# Patient Record
Sex: Male | Born: 1940
Health system: Southern US, Community
[De-identification: ages and names within clinical notes are randomized; demographics above are authoritative.]

## PROBLEM LIST (undated history)

## (undated) DIAGNOSIS — IMO0001 Reserved for inherently not codable concepts without codable children: Secondary | ICD-10-CM

## (undated) DIAGNOSIS — I10 Essential (primary) hypertension: Secondary | ICD-10-CM

## (undated) DIAGNOSIS — E7800 Pure hypercholesterolemia, unspecified: Secondary | ICD-10-CM

## (undated) DIAGNOSIS — R55 Syncope and collapse: Secondary | ICD-10-CM

## (undated) DIAGNOSIS — E039 Hypothyroidism, unspecified: Secondary | ICD-10-CM

## (undated) DIAGNOSIS — G4730 Sleep apnea, unspecified: Secondary | ICD-10-CM

## (undated) DIAGNOSIS — E119 Type 2 diabetes mellitus without complications: Secondary | ICD-10-CM

## (undated) DIAGNOSIS — I2510 Atherosclerotic heart disease of native coronary artery without angina pectoris: Secondary | ICD-10-CM

## (undated) HISTORY — PX: APPENDECTOMY: SHX54

## (undated) HISTORY — PX: HERNIA REPAIR: SHX51

## (undated) HISTORY — PX: THYROID SURGERY: SHX805

---

## 2001-02-08 ENCOUNTER — Ambulatory Visit (HOSPITAL_COMMUNITY): Admission: RE | Admit: 2001-02-08 | Discharge: 2001-02-08 | Payer: Self-pay | Admitting: Cardiology

## 2001-02-22 ENCOUNTER — Ambulatory Visit (HOSPITAL_COMMUNITY): Admission: RE | Admit: 2001-02-22 | Discharge: 2001-02-22 | Payer: Self-pay | Admitting: Cardiology

## 2003-03-24 ENCOUNTER — Ambulatory Visit (HOSPITAL_COMMUNITY): Admission: RE | Admit: 2003-03-24 | Discharge: 2003-03-24 | Payer: Self-pay | Admitting: Pulmonary Disease

## 2005-08-21 ENCOUNTER — Ambulatory Visit: Payer: Self-pay | Admitting: Internal Medicine

## 2005-08-21 LAB — CONVERTED CEMR LAB
BUN: 24 mg/dL
CO2: 26 meq/L
Chloride: 103 meq/L
Cholesterol: 256 mg/dL
Creatinine, Ser: 1.38 mg/dL
Glucose, Bld: 145 mg/dL
HDL: 47 mg/dL
LDL Cholesterol: 191 mg/dL
Microalb Creat Ratio: 4.1 mg/g
Microalb, Ur: 0.79 mg/dL
Potassium: 4.4 meq/L
Sodium: 140 meq/L
TSH: 1.718 microintl units/mL
Total CHOL/HDL Ratio: 5.4
Triglycerides: 92 mg/dL
VLDL: 18 mg/dL

## 2005-10-05 ENCOUNTER — Ambulatory Visit: Payer: Self-pay | Admitting: Internal Medicine

## 2005-11-16 ENCOUNTER — Ambulatory Visit: Payer: Self-pay | Admitting: Internal Medicine

## 2005-11-16 LAB — CONVERTED CEMR LAB
ALT: 13 units/L
AST: 22 units/L
Albumin: 4.4 g/dL
Alkaline Phosphatase: 51 units/L
Bilirubin, Direct: 0.1 mg/dL
Indirect Bilirubin: 0.5 mg/dL
Total Bilirubin: 0.6 mg/dL
Total Protein: 7.1 g/dL

## 2005-12-28 ENCOUNTER — Ambulatory Visit: Payer: Self-pay | Admitting: Internal Medicine

## 2005-12-28 LAB — CONVERTED CEMR LAB: Hgb A1c MFr Bld: 7.4 %

## 2006-01-13 ENCOUNTER — Encounter: Payer: Self-pay | Admitting: Internal Medicine

## 2006-01-13 DIAGNOSIS — G609 Hereditary and idiopathic neuropathy, unspecified: Secondary | ICD-10-CM

## 2006-01-13 DIAGNOSIS — E785 Hyperlipidemia, unspecified: Secondary | ICD-10-CM

## 2006-01-13 DIAGNOSIS — I739 Peripheral vascular disease, unspecified: Secondary | ICD-10-CM | POA: Insufficient documentation

## 2006-01-13 DIAGNOSIS — I10 Essential (primary) hypertension: Secondary | ICD-10-CM

## 2006-01-13 HISTORY — DX: Hereditary and idiopathic neuropathy, unspecified: G60.9

## 2006-03-29 ENCOUNTER — Ambulatory Visit: Payer: Self-pay | Admitting: Internal Medicine

## 2006-03-29 DIAGNOSIS — E039 Hypothyroidism, unspecified: Secondary | ICD-10-CM

## 2006-03-29 LAB — CONVERTED CEMR LAB: Hgb A1c MFr Bld: 7.2 %

## 2006-04-02 ENCOUNTER — Encounter (INDEPENDENT_AMBULATORY_CARE_PROVIDER_SITE_OTHER): Payer: Self-pay | Admitting: Internal Medicine

## 2006-04-02 LAB — CONVERTED CEMR LAB
AST: 19 units/L (ref 0–37)
Albumin: 4.3 g/dL (ref 3.5–5.2)
Alkaline Phosphatase: 52 units/L (ref 39–117)
Glucose, Bld: 115 mg/dL — ABNORMAL HIGH (ref 70–99)
LDL Cholesterol: 98 mg/dL (ref 0–99)
Potassium: 4.5 meq/L (ref 3.5–5.3)
Sodium: 140 meq/L (ref 135–145)
TSH: 0.267 microintl units/mL — ABNORMAL LOW (ref 0.350–5.50)
Total Protein: 7 g/dL (ref 6.0–8.3)

## 2006-05-10 ENCOUNTER — Ambulatory Visit: Payer: Self-pay | Admitting: Internal Medicine

## 2006-05-11 LAB — CONVERTED CEMR LAB: Free T4: 1.27 ng/dL (ref 0.89–1.80)

## 2006-06-21 ENCOUNTER — Ambulatory Visit: Payer: Self-pay | Admitting: Internal Medicine

## 2006-06-21 LAB — CONVERTED CEMR LAB: Hgb A1c MFr Bld: 6.9 %

## 2006-08-20 ENCOUNTER — Telehealth (INDEPENDENT_AMBULATORY_CARE_PROVIDER_SITE_OTHER): Payer: Self-pay | Admitting: *Deleted

## 2006-08-21 ENCOUNTER — Telehealth (INDEPENDENT_AMBULATORY_CARE_PROVIDER_SITE_OTHER): Payer: Self-pay | Admitting: Internal Medicine

## 2006-09-17 ENCOUNTER — Ambulatory Visit: Payer: Self-pay | Admitting: Internal Medicine

## 2006-09-17 ENCOUNTER — Telehealth (INDEPENDENT_AMBULATORY_CARE_PROVIDER_SITE_OTHER): Payer: Self-pay | Admitting: *Deleted

## 2006-09-18 ENCOUNTER — Telehealth (INDEPENDENT_AMBULATORY_CARE_PROVIDER_SITE_OTHER): Payer: Self-pay | Admitting: *Deleted

## 2006-09-18 ENCOUNTER — Encounter (INDEPENDENT_AMBULATORY_CARE_PROVIDER_SITE_OTHER): Payer: Self-pay | Admitting: Internal Medicine

## 2006-09-18 LAB — CONVERTED CEMR LAB
ALT: 12 U/L
AST: 15 U/L
Albumin: 4.3 g/dL
Alkaline Phosphatase: 49 U/L
BUN: 27 mg/dL — ABNORMAL HIGH
CO2: 24 meq/L
Calcium: 9.7 mg/dL
Chloride: 106 meq/L
Cholesterol: 154 mg/dL
Creatinine, Ser: 1.21 mg/dL
Glucose, Bld: 82 mg/dL
HDL: 52 mg/dL
LDL Cholesterol: 88 mg/dL
Potassium: 4.3 meq/L
Sodium: 142 meq/L
TSH: 0.283 u[IU]/mL — ABNORMAL LOW
Total Bilirubin: 0.4 mg/dL
Total CHOL/HDL Ratio: 3
Total Protein: 7 g/dL
Triglycerides: 69 mg/dL
VLDL: 14 mg/dL

## 2006-11-23 ENCOUNTER — Telehealth (INDEPENDENT_AMBULATORY_CARE_PROVIDER_SITE_OTHER): Payer: Self-pay | Admitting: *Deleted

## 2006-12-17 ENCOUNTER — Ambulatory Visit: Payer: Self-pay | Admitting: Internal Medicine

## 2006-12-17 DIAGNOSIS — T1490XA Injury, unspecified, initial encounter: Secondary | ICD-10-CM

## 2006-12-17 LAB — CONVERTED CEMR LAB
Microalb Creat Ratio: 4 mg/g (ref 0.0–30.0)
Microalb, Ur: 0.67 mg/dL (ref 0.00–1.89)

## 2006-12-25 ENCOUNTER — Telehealth (INDEPENDENT_AMBULATORY_CARE_PROVIDER_SITE_OTHER): Payer: Self-pay | Admitting: *Deleted

## 2007-01-15 ENCOUNTER — Encounter (INDEPENDENT_AMBULATORY_CARE_PROVIDER_SITE_OTHER): Payer: Self-pay | Admitting: Internal Medicine

## 2007-01-18 ENCOUNTER — Telehealth (INDEPENDENT_AMBULATORY_CARE_PROVIDER_SITE_OTHER): Payer: Self-pay | Admitting: Internal Medicine

## 2007-03-18 ENCOUNTER — Ambulatory Visit: Payer: Self-pay | Admitting: Internal Medicine

## 2007-03-18 LAB — CONVERTED CEMR LAB
Blood Glucose, Fingerstick: 135
Hgb A1c MFr Bld: 7.8 %

## 2007-04-23 ENCOUNTER — Encounter (INDEPENDENT_AMBULATORY_CARE_PROVIDER_SITE_OTHER): Payer: Self-pay | Admitting: Internal Medicine

## 2007-04-24 ENCOUNTER — Telehealth (INDEPENDENT_AMBULATORY_CARE_PROVIDER_SITE_OTHER): Payer: Self-pay | Admitting: *Deleted

## 2007-04-24 LAB — CONVERTED CEMR LAB
ALT: 12 units/L (ref 0–53)
AST: 16 units/L (ref 0–37)
Alkaline Phosphatase: 52 units/L (ref 39–117)
Calcium: 9.9 mg/dL (ref 8.4–10.5)
Chloride: 104 meq/L (ref 96–112)
Creatinine, Ser: 1.16 mg/dL (ref 0.40–1.50)
LDL Cholesterol: 91 mg/dL (ref 0–99)
Total CHOL/HDL Ratio: 3
VLDL: 13 mg/dL (ref 0–40)

## 2007-06-17 ENCOUNTER — Ambulatory Visit: Payer: Self-pay | Admitting: Internal Medicine

## 2007-06-17 LAB — CONVERTED CEMR LAB
Blood Glucose, Fingerstick: 96
Hgb A1c MFr Bld: 7.2 %

## 2007-09-18 ENCOUNTER — Ambulatory Visit: Payer: Self-pay | Admitting: Internal Medicine

## 2007-09-25 ENCOUNTER — Encounter (INDEPENDENT_AMBULATORY_CARE_PROVIDER_SITE_OTHER): Payer: Self-pay | Admitting: Internal Medicine

## 2007-12-18 ENCOUNTER — Ambulatory Visit: Payer: Self-pay | Admitting: Internal Medicine

## 2007-12-19 LAB — CONVERTED CEMR LAB
AST: 17 units/L (ref 0–37)
Albumin: 4.6 g/dL (ref 3.5–5.2)
Alkaline Phosphatase: 54 units/L (ref 39–117)
Basophils Relative: 0 % (ref 0–1)
Eosinophils Absolute: 0.1 10*3/uL (ref 0.0–0.7)
Eosinophils Relative: 1 % (ref 0–5)
HDL: 52 mg/dL (ref >39)
LDL Cholesterol: 87 mg/dL (ref 0–99)
MCHC: 31.5 g/dL (ref 30.0–36.0)
MCV: 90.7 fL (ref 78.0–100.0)
Neutrophils Relative %: 67 % (ref 43–77)
Platelets: 294 10*3/uL (ref 150–400)
Potassium: 4.4 meq/L (ref 3.5–5.3)
RDW: 14.9 % (ref 11.5–15.5)
Sodium: 142 meq/L (ref 135–145)
TSH: 1.028 microintl units/mL (ref 0.350–4.50)
Total Bilirubin: 0.5 mg/dL (ref 0.3–1.2)
Total Protein: 7.3 g/dL (ref 6.0–8.3)
VLDL: 11 mg/dL (ref 0–40)

## 2008-02-24 ENCOUNTER — Encounter (INDEPENDENT_AMBULATORY_CARE_PROVIDER_SITE_OTHER): Payer: Self-pay | Admitting: Internal Medicine

## 2008-03-17 ENCOUNTER — Ambulatory Visit: Payer: Self-pay | Admitting: Internal Medicine

## 2008-03-17 LAB — CONVERTED CEMR LAB: Hgb A1c MFr Bld: 7.8 %

## 2008-03-31 ENCOUNTER — Telehealth (INDEPENDENT_AMBULATORY_CARE_PROVIDER_SITE_OTHER): Payer: Self-pay | Admitting: Internal Medicine

## 2008-04-08 ENCOUNTER — Encounter (INDEPENDENT_AMBULATORY_CARE_PROVIDER_SITE_OTHER): Payer: Self-pay | Admitting: Internal Medicine

## 2008-04-09 ENCOUNTER — Encounter (INDEPENDENT_AMBULATORY_CARE_PROVIDER_SITE_OTHER): Payer: Self-pay | Admitting: Internal Medicine

## 2008-06-16 ENCOUNTER — Ambulatory Visit: Payer: Self-pay | Admitting: Internal Medicine

## 2008-06-16 LAB — CONVERTED CEMR LAB: Blood Glucose, Fingerstick: 133

## 2008-06-17 ENCOUNTER — Telehealth (INDEPENDENT_AMBULATORY_CARE_PROVIDER_SITE_OTHER): Payer: Self-pay | Admitting: *Deleted

## 2008-06-17 ENCOUNTER — Encounter (INDEPENDENT_AMBULATORY_CARE_PROVIDER_SITE_OTHER): Payer: Self-pay | Admitting: Internal Medicine

## 2008-06-17 LAB — CONVERTED CEMR LAB
Ferritin: 69 ng/mL (ref 22–322)
Iron: 107 ug/dL (ref 42–165)
Saturation Ratios: 29 % (ref 20–55)
Vitamin B-12: 353 pg/mL (ref 211–911)

## 2008-06-18 LAB — CONVERTED CEMR LAB
Alkaline Phosphatase: 55 units/L (ref 39–117)
BUN: 21 mg/dL (ref 6–23)
Basophils Absolute: 0 10*3/uL (ref 0.0–0.1)
Basophils Relative: 1 % (ref 0–1)
CO2: 22 meq/L (ref 19–32)
Cholesterol: 166 mg/dL (ref 0–200)
Creatinine, Ser: 1.06 mg/dL (ref 0.40–1.50)
Eosinophils Absolute: 0.1 10*3/uL (ref 0.0–0.7)
Eosinophils Relative: 1 % (ref 0–5)
Glucose, Bld: 128 mg/dL — ABNORMAL HIGH (ref 70–99)
HDL: 52 mg/dL (ref >39)
Hemoglobin: 12.2 g/dL — ABNORMAL LOW (ref 13.0–17.0)
LDL Cholesterol: 102 mg/dL — ABNORMAL HIGH (ref 0–99)
MCHC: 31.6 g/dL (ref 30.0–36.0)
MCV: 90.6 fL (ref 78.0–100.0)
Monocytes Absolute: 0.5 10*3/uL (ref 0.1–1.0)
Monocytes Relative: 9 % (ref 3–12)
Neutro Abs: 3.6 10*3/uL (ref 1.7–7.7)
RBC: 4.26 M/uL (ref 4.22–5.81)
RDW: 15.7 % — ABNORMAL HIGH (ref 11.5–15.5)
Sodium: 142 meq/L (ref 135–145)
Total Bilirubin: 0.5 mg/dL (ref 0.3–1.2)
Total CHOL/HDL Ratio: 3.2
Triglycerides: 60 mg/dL (ref <150)
VLDL: 12 mg/dL (ref 0–40)

## 2008-06-30 ENCOUNTER — Ambulatory Visit: Payer: Self-pay | Admitting: Internal Medicine

## 2008-09-16 ENCOUNTER — Ambulatory Visit: Payer: Self-pay | Admitting: Internal Medicine

## 2008-09-16 DIAGNOSIS — E785 Hyperlipidemia, unspecified: Secondary | ICD-10-CM

## 2008-09-16 DIAGNOSIS — E1169 Type 2 diabetes mellitus with other specified complication: Secondary | ICD-10-CM

## 2008-09-16 LAB — CONVERTED CEMR LAB: Hgb A1c MFr Bld: 7.6 %

## 2008-09-30 ENCOUNTER — Encounter (INDEPENDENT_AMBULATORY_CARE_PROVIDER_SITE_OTHER): Payer: Self-pay | Admitting: Internal Medicine

## 2008-12-16 ENCOUNTER — Encounter: Payer: Self-pay | Admitting: Gastroenterology

## 2008-12-22 ENCOUNTER — Ambulatory Visit: Payer: Self-pay | Admitting: Gastroenterology

## 2008-12-22 ENCOUNTER — Ambulatory Visit (HOSPITAL_COMMUNITY): Admission: RE | Admit: 2008-12-22 | Discharge: 2008-12-22 | Payer: Self-pay | Admitting: Internal Medicine

## 2008-12-28 ENCOUNTER — Encounter: Payer: Self-pay | Admitting: Gastroenterology

## 2008-12-30 ENCOUNTER — Encounter (INDEPENDENT_AMBULATORY_CARE_PROVIDER_SITE_OTHER): Payer: Self-pay

## 2010-01-30 ENCOUNTER — Encounter: Payer: Self-pay | Admitting: Internal Medicine

## 2010-04-12 LAB — GLUCOSE, CAPILLARY: Glucose-Capillary: 120 mg/dL — ABNORMAL HIGH (ref 70–99)

## 2010-05-27 NOTE — Procedures (Signed)
NAME:  TYRAY, PROCH                           ACCOUNT NO.:  [id number]   MEDICAL RECORD NO.:  [id number]                   PATIENT TYPE:  OUT   LOCATION:  [facility]                                 FACILITY:  [facility]   PHYSICIAN:  Edward L. Juanetta Gosling, M.D.             DATE OF BIRTH:  1940/11/20   DATE OF PROCEDURE:  DATE OF DISCHARGE:                                    STRESS TEST   INDICATIONS FOR PROCEDURE:  Mr. Rumpf has multiple cardiac risk factors and  is undergoing graded exercise testing to rule out ischemic cardiac disease.  There are no contraindications to graded exercise testing.   FINDINGS:  His resting blood pressure was elevated.  His resting  electrocardiogram was normal with perhaps some borderline left ventricular  hypertrophy.   Mr. Lacivita exercised for four minutes on the Bruce protocol, reaching and  sustaining 7.0 mets.  His blood pressure response to exercise was quite  exaggerated.  He reached a maximum heart rate of 151 which is 97% of his age-  predicted maximal heart rate.  He had no chest pain during exercise and no  other symptoms.  He had no electrocardiographic changes of inducible  ischemia.   IMPRESSION:  1. Fair exercise tolerance.  2. Markedly hypertensive response to exercise.  3. No evidence of inducible ischemia.  4. No symptoms with exercise.      ___________________________________________                                            Oneal Deputy. Juanetta Gosling, M.D.   ELH/MEDQ  D:  03/24/2003  T:  03/24/2003  Job:  [id number]   cc:   Angus G. Renard Matter, M.D.  [address]  [place]  [place] [address]  Fax: [phone]

## 2011-07-28 ENCOUNTER — Emergency Department (HOSPITAL_COMMUNITY): Payer: Medicare Other

## 2011-07-28 ENCOUNTER — Inpatient Hospital Stay (HOSPITAL_COMMUNITY)
Admission: EM | Admit: 2011-07-28 | Discharge: 2011-08-01 | DRG: 312 | Disposition: A | Discharge status: Home / Self Care | Payer: Medicare Other | Attending: Internal Medicine | Admitting: Internal Medicine

## 2011-07-28 ENCOUNTER — Encounter (HOSPITAL_COMMUNITY): Payer: Self-pay | Admitting: *Deleted

## 2011-07-28 DIAGNOSIS — I498 Other specified cardiac arrhythmias: Secondary | ICD-10-CM | POA: Diagnosis present

## 2011-07-28 DIAGNOSIS — E1149 Type 2 diabetes mellitus with other diabetic neurological complication: Secondary | ICD-10-CM | POA: Diagnosis present

## 2011-07-28 DIAGNOSIS — E7800 Pure hypercholesterolemia, unspecified: Secondary | ICD-10-CM | POA: Diagnosis present

## 2011-07-28 DIAGNOSIS — E1142 Type 2 diabetes mellitus with diabetic polyneuropathy: Secondary | ICD-10-CM | POA: Diagnosis present

## 2011-07-28 DIAGNOSIS — E785 Hyperlipidemia, unspecified: Secondary | ICD-10-CM | POA: Diagnosis present

## 2011-07-28 DIAGNOSIS — Z79899 Other long term (current) drug therapy: Secondary | ICD-10-CM

## 2011-07-28 DIAGNOSIS — Z7982 Long term (current) use of aspirin: Secondary | ICD-10-CM

## 2011-07-28 DIAGNOSIS — Z833 Family history of diabetes mellitus: Secondary | ICD-10-CM

## 2011-07-28 DIAGNOSIS — E890 Postprocedural hypothyroidism: Secondary | ICD-10-CM | POA: Diagnosis present

## 2011-07-28 DIAGNOSIS — R001 Bradycardia, unspecified: Secondary | ICD-10-CM | POA: Diagnosis present

## 2011-07-28 DIAGNOSIS — R55 Syncope and collapse: Principal | ICD-10-CM | POA: Diagnosis present

## 2011-07-28 DIAGNOSIS — I10 Essential (primary) hypertension: Secondary | ICD-10-CM | POA: Diagnosis present

## 2011-07-28 DIAGNOSIS — E1169 Type 2 diabetes mellitus with other specified complication: Secondary | ICD-10-CM | POA: Diagnosis present

## 2011-07-28 DIAGNOSIS — E860 Dehydration: Secondary | ICD-10-CM | POA: Diagnosis present

## 2011-07-28 HISTORY — DX: Pure hypercholesterolemia, unspecified: E78.00

## 2011-07-28 HISTORY — DX: Hypothyroidism, unspecified: E03.9

## 2011-07-28 HISTORY — DX: Essential (primary) hypertension: I10

## 2011-07-28 LAB — BASIC METABOLIC PANEL
BUN: 23 mg/dL (ref 6–23)
CO2: 27 mEq/L (ref 19–32)
Calcium: 10.3 mg/dL (ref 8.4–10.5)
Creatinine, Ser: 1.21 mg/dL (ref 0.50–1.35)
Glucose, Bld: 197 mg/dL — ABNORMAL HIGH (ref 70–99)

## 2011-07-28 LAB — CBC WITH DIFFERENTIAL/PLATELET
Basophils Absolute: 0 10*3/uL (ref 0.0–0.1)
Eosinophils Absolute: 0.1 10*3/uL (ref 0.0–0.7)
Eosinophils Relative: 1 % (ref 0–5)
Lymphs Abs: 1.3 10*3/uL (ref 0.7–4.0)
MCH: 29.5 pg (ref 26.0–34.0)
MCHC: 32.9 g/dL (ref 30.0–36.0)
MCV: 89.4 fL (ref 78.0–100.0)
Platelets: 224 10*3/uL (ref 150–400)
RDW: 14.2 % (ref 11.5–15.5)

## 2011-07-28 LAB — GLUCOSE, CAPILLARY: Glucose-Capillary: 214 mg/dL — ABNORMAL HIGH (ref 70–99)

## 2011-07-28 LAB — URINALYSIS, ROUTINE W REFLEX MICROSCOPIC
Glucose, UA: NEGATIVE mg/dL
Hgb urine dipstick: NEGATIVE
Ketones, ur: NEGATIVE mg/dL
Leukocytes, UA: NEGATIVE
pH: 5.5 (ref 5.0–8.0)

## 2011-07-28 LAB — TROPONIN I: Troponin I: <0.3 ng/mL (ref <0.30)

## 2011-07-28 LAB — CARDIAC PANEL(CRET KIN+CKTOT+MB+TROPI)
Relative Index: 2.4 (ref 0.0–2.5)
Total CK: 119 U/L (ref 7–232)
Troponin I: <0.3 ng/mL (ref <0.30)

## 2011-07-28 MED ORDER — LEVOTHYROXINE SODIUM 88 MCG PO TABS
88.0000 ug | ORAL_TABLET | Freq: Every day | ORAL | Status: DC
  Administered 2011-07-29 – 2011-08-01 (×4): 88 ug via ORAL
  Filled 2011-07-28 (×5): qty 1

## 2011-07-28 MED ORDER — BENAZEPRIL HCL 10 MG PO TABS
20.0000 mg | ORAL_TABLET | Freq: Every day | ORAL | Status: DC
  Administered 2011-07-29 – 2011-08-01 (×4): 20 mg via ORAL
  Filled 2011-07-28 (×4): qty 2

## 2011-07-28 MED ORDER — SODIUM CHLORIDE 0.9 % IV SOLN
INTRAVENOUS | Status: AC
  Administered 2011-07-28 – 2011-07-29 (×2): via INTRAVENOUS

## 2011-07-28 MED ORDER — INSULIN ASPART 100 UNIT/ML ~~LOC~~ SOLN
0.0000 [IU] | Freq: Three times a day (TID) | SUBCUTANEOUS | Status: DC
  Administered 2011-07-29: 3 [IU] via SUBCUTANEOUS
  Administered 2011-07-29 (×2): 1 [IU] via SUBCUTANEOUS
  Administered 2011-07-30 – 2011-07-31 (×3): 2 [IU] via SUBCUTANEOUS
  Administered 2011-07-31: 1 [IU] via SUBCUTANEOUS
  Administered 2011-07-31 – 2011-08-01 (×2): 2 [IU] via SUBCUTANEOUS

## 2011-07-28 MED ORDER — METFORMIN HCL 500 MG PO TABS
500.0000 mg | ORAL_TABLET | Freq: Two times a day (BID) | ORAL | Status: DC
  Administered 2011-07-28 – 2011-08-01 (×8): 500 mg via ORAL
  Filled 2011-07-28 (×7): qty 1

## 2011-07-28 MED ORDER — ATORVASTATIN CALCIUM 40 MG PO TABS
40.0000 mg | ORAL_TABLET | Freq: Every day | ORAL | Status: DC
  Administered 2011-07-28 – 2011-07-31 (×4): 40 mg via ORAL
  Filled 2011-07-28 (×5): qty 1

## 2011-07-28 MED ORDER — ONDANSETRON HCL 4 MG/2ML IJ SOLN: 4.0000 mg | Freq: Three times a day (TID) | INTRAMUSCULAR | Status: DC | PRN

## 2011-07-28 MED ORDER — SIMVASTATIN 20 MG PO TABS: 80.0000 mg | ORAL_TABLET | Freq: Every day | ORAL | Status: DC

## 2011-07-28 MED ORDER — ASPIRIN 81 MG PO CHEW
81.0000 mg | CHEWABLE_TABLET | Freq: Every day | ORAL | Status: DC
  Administered 2011-07-28 – 2011-08-01 (×5): 81 mg via ORAL
  Filled 2011-07-28 (×5): qty 1

## 2011-07-28 MED ORDER — ENOXAPARIN SODIUM 40 MG/0.4ML ~~LOC~~ SOLN
40.0000 mg | SUBCUTANEOUS | Status: DC
  Administered 2011-07-28 – 2011-07-31 (×4): 40 mg via SUBCUTANEOUS
  Filled 2011-07-28 (×4): qty 0.4

## 2011-07-28 MED ORDER — INSULIN ASPART 100 UNIT/ML ~~LOC~~ SOLN
0.0000 [IU] | Freq: Every day | SUBCUTANEOUS | Status: DC
  Administered 2011-07-28: 2 [IU] via SUBCUTANEOUS

## 2011-07-28 MED ORDER — SODIUM CHLORIDE 0.9 % IJ SOLN
INTRAMUSCULAR | Status: AC
  Administered 2011-07-28: 3 mL
  Filled 2011-07-28: qty 3

## 2011-07-28 MED ORDER — AMLODIPINE BESYLATE 5 MG PO TABS
10.0000 mg | ORAL_TABLET | Freq: Every day | ORAL | Status: DC
  Administered 2011-07-28 – 2011-08-01 (×5): 10 mg via ORAL
  Filled 2011-07-28 (×5): qty 2

## 2011-07-28 NOTE — ED Provider Notes (Addendum)
History     CSN: [id number]  Arrival date & time 07/28/11  1206   First MD Initiated Contact with Patient 07/28/11 1305      Chief Complaint  Patient presents with  . Loss of Consciousness    (Consider location/radiation/quality/duration/timing/severity/associated sxs/prior treatment) Patient is a Dylan Robles presenting with syncope. The history is provided by the patient.  Loss of Consciousness  He was outside talking with a neighbor when he started "feeling bad". He denies dizziness, lightheadedness, chest pain, heaviness, dyspnea, nausea, vomiting. He had been sweating as stated he had been talking with his neighbor for about 45 minutes. Then passed out and he does not remember falling. EMS arrived and noted his heart rate was low blood and pressure was borderline. He says he feels fine now. He has never passed out before.  Past Medical History  Diagnosis Date  . Hypertension   . Diabetes mellitus     Past Surgical History  Procedure Date  . Thyroid surgery   . Hernia repair     No family history on file.  History  Substance Use Topics  . Smoking status: Never Smoker   . Smokeless tobacco: Not on file  . Alcohol Use: No      Review of Systems  Cardiovascular: Positive for syncope.  All other systems reviewed and are negative.    Allergies  Review of patient's allergies indicates no known allergies.  Home Medications   Current Outpatient Rx  Name Route Sig Dispense Refill  . ASPIRIN EC 81 MG PO TBEC Oral Take 81 mg by mouth at bedtime.    [name] METFORMIN HCL 500 MG PO TABS Oral Take 500 mg by mouth 2 (two) times daily with a meal.      BP 121/60  Pulse 50  Temp 98.8 F (37.1 C) (Oral)  Resp 16  Ht 6\' 6"  (1.981 m)  Wt 218 lb (98.884 kg)  BMI 25.19 kg/m2  SpO2 98%  Physical Exam  Nursing note and vitals reviewed.  D[age] year old Robles who is resting comfortably and in no acute distress. Vital signs are significant for bradycardia with heart rate of 50.  Oxygen saturation is 98% which is normal. Head is normocephalic and atraumatic. PERRLA, EOMI. Neck is nontender and supple. There no carotid bruits. Back is nontender. Lungs are clear without rales, wheezes, rhonchi. Heart is regular rate and rhythm which is bradycardic. No murmurs are heard. Abdomen is soft, flat, nontender without masses or hepatosplenomegaly. Extremities have no cyanosis or edema, full range of motion is present. Skin is warm and dry without rash. Neurologic: Mental status is normal, cranial nerves are intact, there are no motor or sensory deficits.  ED Course  Procedures (including critical care time)  Results for orders placed during the hospital encounter of 07/28/11  BASIC METABOLIC PANEL      Component Value Range   Sodium 139  135 - 145 mEq/L   Potassium 5.1  3.5 - 5.1 mEq/L   Chloride 104  96 - 112 mEq/L   CO2 27  19 - 32 mEq/L   Glucose, Bld 197 (*) 70 - 99 mg/dL   BUN 23  6 - 23 mg/dL   Creatinine, Ser 4.09  0.50 - 1.35 mg/dL   Calcium 81.1  8.4 - 91.4 mg/dL   GFR calc non Af Amer 58 (*) >90 mL/min   GFR calc Af Amer 67 (*) >90 mL/min  CBC WITH DIFFERENTIAL      Component Value Range  WBC 10.3  4.0 - 10.5 K/uL   RBC 3.87 (*) 4.22 - 5.81 MIL/uL   Hemoglobin 11.4 (*) 13.0 - 17.0 g/dL   HCT 16.1 (*) 09.6 - 04.5 %   MCV 89.4  78.0 - 100.0 fL   MCH 29.5  26.0 - 34.0 pg   MCHC 32.9  30.0 - 36.0 g/dL   RDW 40.9  81.1 - 91.4 %   Platelets 224  150 - 400 K/uL   Neutrophils Relative 82 (*) 43 - 77 %   Neutro Abs 8.4 (*) 1.7 - 7.7 K/uL   Lymphocytes Relative 12  12 - 46 %   Lymphs Abs 1.3  0.7 - 4.0 K/uL   Monocytes Relative 5  3 - 12 %   Monocytes Absolute 0.5  0.1 - 1.0 K/uL   Eosinophils Relative 1  0 - 5 %   Eosinophils Absolute 0.1  0.0 - 0.7 K/uL   Basophils Relative 0  0 - 1 %   Basophils Absolute 0.0  0.0 - 0.1 K/uL  TROPONIN I      Component Value Range   Troponin I <0.30  <0.30 ng/mL  URINALYSIS, ROUTINE W REFLEX MICROSCOPIC      Component Value  Range   Color, Urine YELLOW  YELLOW   APPearance CLEAR  CLEAR   Specific Gravity, Urine >1.030 (*) 1.005 - 1.030   pH 5.5  5.0 - 8.0   Glucose, UA NEGATIVE  NEGATIVE mg/dL   Hgb urine dipstick NEGATIVE  NEGATIVE   Bilirubin Urine NEGATIVE  NEGATIVE   Ketones, ur NEGATIVE  NEGATIVE mg/dL   Protein, ur NEGATIVE  NEGATIVE mg/dL   Urobilinogen, UA 0.2  0.0 - 1.0 mg/dL   Nitrite NEGATIVE  NEGATIVE   Leukocytes, UA NEGATIVE  NEGATIVE   Dg Chest 2 View  07/28/2011  *RADIOLOGY REPORT*  Clinical Data: Loss of consciousness  CHEST - 2 VIEW  Comparison: None.  Findings: Heart size is normal.  Mediastinal shadows are normal. Lungs are clear.  The vascularity is normal.  No effusions.  No significant bony finding.  IMPRESSION: Normal chest  Original Report Authenticated By: Thomasenia Sales, M.D.      Date: 07/28/2011  Rate: 48  Rhythm: sinus bradycardia and sinus arrhythmia  QRS Axis: normal  Intervals: normal  ST/T Wave abnormalities: normal  Conduction Disutrbances:none  Narrative Interpretation: Sinus bradycardia, no old ECG available for comparison.  Old EKG Reviewed: none available    1. Syncope   2. Bradycardia       MDM  Syncopal episode with significant bradycardia. Of note, I do not see any beta blockers or calcium channel blockers on his medication list to account for the bradycardia. He may need to be considered for pacemaker insertion.  Heart rate has gotten as high as 60 but continues to stay at 48-50 and rest. There is no drop in blood pressure with orthostatic testing. Case is discussed with Dr. Ballard Russell and arrangements are made for admission.      Dione Booze, MD 07/28/11 1442  Dione Booze, MD 07/28/11 [phone]

## 2011-07-28 NOTE — ED Notes (Signed)
Patient is comfortable at this time not complaining of dizziness.

## 2011-07-28 NOTE — Progress Notes (Signed)
Brief Nutrition Note  Patient identified on the Nutrition Risk Report for weight loss.   Body mass index is 24.33 kg/(m^2). Pt meets criteria for normal based on current BMI.   Current diet order is Heart Healthy, patient is newly admitted. Labs and medications reviewed. He reports gradual unintended wt loss ~1#/month. Denies loss of appetite or change in eating pattern.  No further nutrition interventions warranted at this time. If additional nutrition issues arise, please re-consult RD.   Royann Shivers MS,RD,LDN Office: [phone] Pager: [phone]

## 2011-07-28 NOTE — ED Notes (Signed)
Pt states he passed out while outside talking to a neighbor. Pt states he was outside for 3 hours. CBG en route was 187. HR on scene was 44, which increased to 56 per EMS. SBP of 100 on scene. Pt arrived alert and oriented.

## 2011-07-28 NOTE — Progress Notes (Signed)
07/28/11 1719 Dr Felecia Shelling paged x 2 to notify of patient's arrival this afternoon. Given admission orders with call back. Orders faxed to pharmacy as requested. Riccardo Dubin

## 2011-07-29 ENCOUNTER — Inpatient Hospital Stay (HOSPITAL_COMMUNITY): Payer: Medicare Other

## 2011-07-29 LAB — GLUCOSE, CAPILLARY: Glucose-Capillary: 122 mg/dL — ABNORMAL HIGH (ref 70–99)

## 2011-07-29 LAB — CARDIAC PANEL(CRET KIN+CKTOT+MB+TROPI)
CK, MB: 2.8 ng/mL (ref 0.3–4.0)
Relative Index: 2.6 — ABNORMAL HIGH (ref 0.0–2.5)
Total CK: 106 U/L (ref 7–232)
Troponin I: <0.3 ng/mL (ref <0.30)

## 2011-07-29 MED ORDER — SODIUM CHLORIDE 0.9 % IV SOLN
INTRAVENOUS | Status: DC
  Administered 2011-07-29 – 2011-07-31 (×2): via INTRAVENOUS

## 2011-07-29 NOTE — H&P (Signed)
NAME:  Dylan Robles, Dylan Robles NO.:  [id number]  MEDICAL RECORD NO.:  [id number]  LOCATION:  A302                          FACILITY:  [facility]  PHYSICIAN:  Nehal Witting D. Felecia Shelling, MD   DATE OF BIRTH:  [birth year]  DATE OF ADMISSION:  07/28/2011 DATE OF DISCHARGE:  LH                             HISTORY & PHYSICAL   CHIEF COMPLAINT:  Loss of consciousness.  HISTORY OF PRESENT ILLNESS:  This is a 71 year old male patient with history of multiple medical illnesses, who was brought to emergency room after he lost his consciousness and fell on the ground.  The patient was talking to his neighbors and he was feeling sick for few days.  He suddenly fell and he did not remember what has happened until he was brought to emergency room.  The patient was evaluated in the emergency room and he was found to have significant bradycardia.  The patient was admitted under telemetry for further treatment.  REVIEW OF SYSTEMS:  No chest pain, fever, chills, headache, cough, shortness of breath, nausea, vomiting, abdominal pain, dysuria, urgency, or frequency of urination.  PAST MEDICAL HISTORY: 1. Hypertension. 2. Diabetes mellitus. 3. Hypothyroidism. 4. Hyperlipidemia.  CURRENT MEDICATIONS: 1. Norvasc 10 mg p.o. daily. 2. Aspirin 81 mg daily. 3. Lotensin 20 mg daily. 4. Levothyroxine 88 mcg daily. 5. Metformin 1000 mg b.i.d. 6. Toprol-XL 100 mg daily. 7. Simvastatin 80 mg daily.  SOCIAL HISTORY:  The patient has no history of alcohol, tobacco, or substance abuse.  FAMILY HISTORY:  Both his father and mother are deceased.  The patient does not know the cause of their death.  PHYSICAL EXAMINATION:  GENERAL:  The patient is alert, awake, and sick looking. VITAL SIGNS:  Blood pressure 115/76, pulse 50, respiratory rate 16, temperature 97.6 degrees Fahrenheit. CHEST:  Decreased air entry, few rhonchi. CARDIOVASCULAR SYSTEM:  First and second heart sounds heard. Bradycardic.  No murmur.   No gallop. ABDOMEN:  Soft and lax.  Bowel sound is positive.  No mass or organomegaly. EXTREMITIES:  No leg edema.  LABORATORY DATA:  CBC:  WBC 10.3, hemoglobin 11.4, hematocrit 34.6, and platelets 224.  BMP:  Sodium 139, potassium 5.1, chloride 104 carbon dioxide 27, glucose 197, BUN 23, creatinine 1.21, calcium 10.3. Urinalysis:  Specific gravity 1.030, pH 5.0, nitrite and leukocytes is negative.  Cardiac Enzymes:  CPK 190, CK-MB 2.9, troponin less than 0.3.  ASSESSMENT: 1. Syncopal episode, etiology not clear.  We will rule out cardiac     dysrhythmia. 2. Bradycardia, probably secondary to medication. 3. Diabetes mellitus. 4. Hypertension. 5. Hypothyroidism. 6. Hyperlipidemia.  PLAN:  We will continue serial EKG and cardiac enzymes.  Continue telemetry.  We will do Cardiology and Neurology consult.  We will continue his regular medications.     Wil Slape D. Felecia Shelling, MD     TDF/MEDQ  D:  07/29/2011  T:  07/29/2011  Job:  [id number]

## 2011-07-29 NOTE — Progress Notes (Signed)
NAME:  Dylan Robles, Dylan Robles NO.:  [id number]  MEDICAL RECORD NO.:  [id number]  LOCATION:  A302                          FACILITY:  [facility]  PHYSICIAN:  Sevin Farone D. Felecia Shelling, MD   DATE OF BIRTH:  24-Jun-1940  DATE OF PROCEDURE:  07/29/2011 DATE OF DISCHARGE:                                PROGRESS NOTE   SUBJECTIVE:  The patient feels better.  He has no more syncopal episodes since admission.  No chest pain.  OBJECTIVE:  GENERAL:  The patient is more alert, awake, and resting. VITAL SIGNS:  Blood pressure 157/79, pulse 66, respiratory rate 20, temperature 98.8 degrees Fahrenheit. CHEST:  Decreased air entry, few rhonchi.  CARDIOVASCULAR:  First and second heart sounds heard.  No murmur.  No gallop. ABDOMEN:  Soft and lax.  Bowel sound is positive.  No mass or organomegaly. EXTREMITIES:  No leg edema.  ASSESSMENT: 1. Bradyarrhythmia. 2. Syncopal episode, probably secondary to the above. 3. Diabetes mellitus. 4. Hypertension. 5. Hypothyroidism.  PLAN:  We will continue serial EKG, cardiac enzymes.  Continue telemetry.  We will do echocardiogram, carotid Doppler and EEG.  We will do Neurology and Cardiology consult.     Nichlos Kunzler D. Felecia Shelling, MD     TDF/MEDQ  D:  07/29/2011  T:  07/29/2011  Job:  [id number]

## 2011-07-30 LAB — GLUCOSE, CAPILLARY
Glucose-Capillary: 192 mg/dL — ABNORMAL HIGH (ref 70–99)
Glucose-Capillary: 145 mg/dL — ABNORMAL HIGH (ref 70–99)

## 2011-07-30 NOTE — Progress Notes (Signed)
Subjective: Patient feels better. He is ambulating without difficultly. No dizziness, no chest pain or shortness of breath. No new complaint.  Physical Exam: Blood pressure 123/72, pulse 58, temperature 97.8 F (36.6 C), temperature source Oral, resp. rate 18, height 6\' 6"  (1.981 m), weight 95.482 kg (210 lb 8 oz), SpO2 96.00%. Chest- clear lung field CVS- S1 & S2 heard bradycardiac ABD- soft and lax, bowel sound + Extremities- No leg edema   Investigations:  No results found for this or any previous visit (from the past 240 hour(s)).   Basic Metabolic Panel:  Basename 07/28/11 1226  NA 139  K 5.1  CL 104  CO2 27  GLUCOSE 197*  BUN 23  CREATININE 1.21  CALCIUM 10.3  MG --  PHOS --   Liver Function Tests: No results found for this  basename: AST:2,ALT:2,ALKPHOS:2,BILITOT:2,PROT:2,ALBUMIN:2 in the last 72 hours   CBC:  Basename 07/28/11 1226  WBC 10.3  NEUTROABS 8.4*  HGB 11.4*  HCT 34.6*  MCV 89.4  PLT 224    Dg Chest 2 View  07/28/2011  *RADIOLOGY REPORT*  Clinical Data:  Loss of consciousness  CHEST - 2 VIEW  Comparison: None.  Findings: Heart size is normal.  Mediastinal shadows are normal. Lungs are clear.  The vascularity is normal.  No effusions.  No significant bony finding.  IMPRESSION: Normal chest  Original Report Authenticated By: Thomasenia Sales, M.D.   US Carotid Duplex Bilateral  07/29/2011  *RADIOLOGY REPORT*  Clinical Data: Syncope, hypertension  BILATERAL CAROTID DUPLEX ULTRASOUND  Technique: Gray scale imaging, color Doppler and duplex ultrasound was performed of bilateral carotid and vertebral arteries in the neck.  Comparison:  None.  Criteria:  Quantification of carotid stenosis is based on velocity parameters that correlate the residual internal carotid diameter with NASCET-based stenosis levels, using the diameter of the distal internal carotid lumen as the denominator for stenosis measurement.  The following velocity measurements were obtained:                   PEAK SYSTOLIC/END DIASTOLIC RIGHT ICA:                        95/15cm/sec CCA:                        107/7cm/sec SYSTOLIC ICA/CCA RATIO:     0.88 DIASTOLIC ICA/CCA RATIO:    2.32 ECA:                        56cm/sec  LEFT ICA:                        78/22cm/sec CCA:                        102/15cm/sec SYSTOLIC ICA/CCA RATIO:     0.78 DIASTOLIC ICA/CCA RATIO:    1.43 ECA:                        51cm/sec  Findings:  RIGHT CAROTID ARTERY: Slight intimal thickening with minor atherosclerotic changes of the bifurcation.  Proximal right ICA tortuosity noted.  No hemodynamically significant ICA stenosis, velocity elevation, or turbulent flow.  RIGHT VERTEBRAL ARTERY:  Antegrade  LEFT CAROTID ARTERY: Mild intimal thickening and patchy atherosclerosis.  No hemodynamically significant ICA stenosis, velocity elevation, or turbulent flow.  LEFT VERTEBRAL ARTERY:  Antegrade  IMPRESSION: Mild plaque formation bilaterally.  No hemodynamically significant ICA stenosis.  Degree of narrowing less than 50% bilaterally.   Original Report Authenticated By: Judie Petit. Ruel Favors, M.D.      Medications: I have reviewed the patient's current medications.  Impression:  1. Syncopal episode 2. Bradyarrhythmia 3.DM type II controlled 4. H/O hypertension Active Problems:  * No active hospital problems. *      Plan 1. Continue tel metry 2. We will continue to monitor orthostatic B/P 3. Echo pending 4.Cardiology/ and neurology consult    LOS: 2 days   Meer Reindl   07/30/2011, 9:50 AM

## 2011-07-31 ENCOUNTER — Encounter (HOSPITAL_COMMUNITY): Payer: Self-pay | Admitting: Adult Health

## 2011-07-31 DIAGNOSIS — I495 Sick sinus syndrome: Secondary | ICD-10-CM

## 2011-07-31 DIAGNOSIS — R001 Bradycardia, unspecified: Secondary | ICD-10-CM | POA: Diagnosis present

## 2011-07-31 DIAGNOSIS — I517 Cardiomegaly: Secondary | ICD-10-CM

## 2011-07-31 DIAGNOSIS — R55 Syncope and collapse: Secondary | ICD-10-CM

## 2011-07-31 HISTORY — DX: Bradycardia, unspecified: R00.1

## 2011-07-31 LAB — BASIC METABOLIC PANEL
GFR calc Af Amer: >90 mL/min (ref >90)
GFR calc non Af Amer: 80 mL/min — ABNORMAL LOW (ref >90)
Potassium: 4.2 mEq/L (ref 3.5–5.1)
Sodium: 135 mEq/L (ref 135–145)

## 2011-07-31 LAB — GLUCOSE, CAPILLARY: Glucose-Capillary: 181 mg/dL — ABNORMAL HIGH (ref 70–99)

## 2011-07-31 LAB — CBC
MCHC: 33.1 g/dL (ref 30.0–36.0)
Platelets: 235 10*3/uL (ref 150–400)
RDW: 13.9 % (ref 11.5–15.5)

## 2011-07-31 NOTE — Progress Notes (Signed)
NAME:  ALEEM, ELZA NO.:  [id number]  MEDICAL RECORD NO.:  [id number]  LOCATION:  A302                          FACILITY:  [facility]  PHYSICIAN:  Corinthian Mizrahi D. Felecia Shelling, MD   DATE OF BIRTH:  Oct 03, 1940  DATE OF PROCEDURE:  07/31/2011 DATE OF DISCHARGE:                                PROGRESS NOTE   SUBJECTIVE:  The patient feels better.  He has no specific complaints. No dizziness or chest pain.  OBJECTIVE:  GENERAL:  The patient is alert, awake, and resting. VITAL SIGNS:  Blood pressure 115/68, pulse 58, respiratory rate 18, temperature 97.2 degrees Fahrenheit. CHEST:  Clear lung fields.  Good air entry. CARDIOVASCULAR SYSTEM:  First and second heart sound heard, bradycardic. ABDOMEN:  Soft and lax.  Bowel sound is positive.  No mass or organomegaly. EXTREMITIES:  No leg edema.  LABORATORY DATA:  CBC:  WBC 6.8, hemoglobin 12.0, hematocrit 36.3, platelets 236.  ASSESSMENT: 1. Syncopal episode. 2. Bradyarrhythmia. 3. Diabetes mellitus. 4. Hypertension.  PLAN:  Continue telemetry.  Neurology and Cardiology consult is pending. We will continue regular treatment.     Drinda Belgard D. Felecia Shelling, MD     TDF/MEDQ  D:  07/31/2011  T:  07/31/2011  Job:  [id number]

## 2011-07-31 NOTE — Progress Notes (Signed)
UR Chart Review Completed  

## 2011-07-31 NOTE — Consult Note (Signed)
CARDIOLOGY CONSULT NOTE  Patient ID: Dylan Robles MRN: [id number] DOB/AGE: May 05, 1940 71 y.o.  Admit date: 07/28/2011 Referring Physician: Tonny Bollman, MD Primary Cardiologist: (New) Reason for Consultation: Bradycardia, with syncope  Active Problems:  DIABETES MELLITUS, UNCONTROLLED, WITH COMPLICATIONS  HYPERTENSION  Bradycardia on ECG  Syncope and collapse  HPI: Dylan Robles is a very pleasant 71 year old, patient with no prior cardiac disease or cardiology issues who presented to the emergency room after a syncopal episode. The patient stated was in his usual state of health when he was visiting with neighbor sitting under a tree for about an hour. He states that it was getting warmer and warmer while he was speaking with the neighbor and they both decided to go inside. He was sitting on the edge of a trailer and when he stood up he passed out. Prior to this happening he was sweating profusely, which caused him to feel as if he was getting overheated. He denied any chest pain shortness of breath or dizziness prior to this episode. He remembers waking up with his friend asking if he was okay. EMS came and assessed him and brought him to the ER. He has never had an episode like this before causing syncope.    The emergency room blood pressure was 121/60 with a heart rate of 50, respirations 16. EKG revealed sinus bradycardia with a rate of 48 beats per minute. Serum creatinine was 1.21 potassium 5.1 sodium 131. Blood glucose was 197 . Cardiac enzymes negative x 3. Chest x-ray was normal, with carotid ultrasound negative for significant disease. The patient was hydrated. He is on metoprolol at home 100 mg XL daily. This has been discontinued in the setting of bradycardia. Orthostatic vital signs were completed, and he was found to be significantly orthostatic: Blood pressure 167/98 lying dropping to 91/56 standing. Heart rate increased from 83-95 from lying to standing.  Prior history includes diabetes hypertension hypercholesterolemia and hypothyroidism secondary to thyroidectomy. He did have a stress Cardiolite completed in 2003 as a screening process with multiple cardiovascular risk factors which was found to be negative for ischemia at that time. Echocardiogram has been ordered. Currently the patient is asymptomatic, feeling well without recurrence of syncope.  Review of systems complete and found to be negative unless listed above   Past Medical History  Diagnosis Date  . Hypertension   . Diabetes mellitus   . Hypothyroidism   . Hypercholesterolemia     Family History  Problem Relation Age of Onset  . Diabetes Father   . Cancer Mother   . Diabetes Brother     History   Social History  . Marital Status: Married    Spouse Name: N/A    Number of Children: N/A  . Years of Education: N/A   Occupational History  . [employer] doctor     Retired   Social History Main Topics  . Smoking status: Never Smoker   . Smokeless tobacco: Not on file  . Alcohol Use: No  . Drug Use:   . Sexually Active:    Other Topics Concern  . Not on file   Social History Narrative  . No narrative on file    Past Surgical History  Procedure Date  . Thyroid surgery   . Hernia repair      Prescriptions prior to admission  Medication Sig Dispense Refill  . amLODipine (NORVASC) 10 MG tablet Take 10 mg by mouth daily.      [name] aspirin EC 81 MG  tablet Take 81 mg by mouth at bedtime.      . benazepril (LOTENSIN) 20 MG tablet Take 20 mg by mouth daily.      [name] levothyroxine (SYNTHROID, LEVOTHROID) 88 MCG tablet Take 88 mcg by mouth daily.      . metFORMIN (GLUCOPHAGE) 1000 MG tablet Take 1,000 mg by mouth 2 (two) times daily.      . metoprolol succinate (TOPROL-XL) 100 MG 24 hr tablet Take 100 mg by mouth daily. Take with or immediately following a meal.      . simvastatin (ZOCOR) 80 MG tablet Take 80 mg by mouth at bedtime.        Physical Exam: Blood pressure  115/68, pulse 60, temperature 97.2 F (36.2 C), temperature source Oral, resp. rate 20, height 6\' 6"  (1.981 m), weight 210 lb 8 oz (95.482 kg), SpO2 97.00%.     General: Well developed, well nourished, in no acute distress Head: Eyes PERRLA, No xanthomas.   Normal cephalic and atramatic  Lungs: Clear bilaterally to auscultation and percussion. Heart: HRRR S1 S2, without MRG.  Pulses are 2+ & equal.            No carotid bruit. No JVD.  No abdominal bruits. No femoral bruits. Abdomen: Bowel sounds are positive, abdomen soft and non-tender without masses or                  Hernia's noted. Msk:  Back normal, normal gait. Normal strength and tone for age. Extremities: No clubbing, cyanosis or edema.  DP +1 Neuro: Alert and oriented X 3. Psych:  Good affect, responds appropriately   Labs:   Lab Results  Component Value Date   WBC 6.8 07/31/2011   HGB 12.0* 07/31/2011   HCT 36.3* 07/31/2011   MCV 87.5 07/31/2011   PLT 235 07/31/2011     Lab 07/31/11 0443  NA 135  K 4.2  CL 98  CO2 28  BUN 20  CREATININE 0.96  CALCIUM 9.9  PROT --  BILITOT --  ALKPHOS --  ALT --  AST --  GLUCOSE 197*   Lab Results  Component Value Date   CKTOTAL 106 07/29/2011   CKMB 2.8 07/29/2011   TROPONINI <0.30 07/29/2011    Lab Results  Component Value Date   CHOL 166 06/17/2008   CHOL 150 12/18/2007   CHOL 157 04/23/2007   Lab Results  Component Value Date   HDL 52 06/17/2008   HDL 52 12/18/2007   HDL 53 1/61/0960   Lab Results  Component Value Date   LDLCALC 102* 06/17/2008   LDLCALC 87 12/18/2007   LDLCALC 91 04/23/2007   Lab Results  Component Value Date   TRIG 60 06/17/2008   TRIG 53 12/18/2007   TRIG 67 04/23/2007   Lab Results  Component Value Date   CHOLHDL 3.2 Ratio 06/17/2008   CHOLHDL 2.9 Ratio 12/18/2007   CHOLHDL 3.0 Ratio 04/23/2007   No results found for this basename: LDLDIRECT  Stress Cardiolite 2003: IMPRESSION NEGATIVE STRESS CARDIOLITE STUDY WITH SOMEWHAT IMPAIRED EXERCISE  CAPACITY,  A HYPERTENSIVE RESPONSE TO EXERCISE, MILD LEFT VENTRICULAR DILATATION WITH MILD GLOBAL  IMPAIRMENTIN LEFT VENTRICULAR SYSTOLIC FUNCTION, BUT NO ELECTROCARDIOGRAPHIC NOR SCINTIGRAPHIC EVIDENCE  FOR MYOCARDIAL ISCHEMIA OR INFARCTION. OTHER FINDINGS AS NOTED.    Radiology: US Carotid Duplex Bilateral  07/29/2011  *RADIOLOGY REPORT*  Clinical Data: Syncope, hypertension  BILATERAL CAROTID DUPLEX ULTRASOUND  Technique: Wallace Cullens scale imaging, color Doppler and duplex ultrasound was performed of bilateral carotid  and vertebral arteries in the neck.  Comparison:  None.  Criteria:  Quantification of carotid stenosis is based on velocity parameters that correlate the residual internal carotid diameter with NASCET-based stenosis levels, using the diameter of the distal internal carotid lumen as the denominator for stenosis measurement.  The following velocity measurements were obtained:                   PEAK SYSTOLIC/END DIASTOLIC RIGHT ICA:                        95/15cm/sec CCA:                        107/7cm/sec SYSTOLIC ICA/CCA RATIO:     0.88 DIASTOLIC ICA/CCA RATIO:    2.32 ECA:                        56cm/sec  LEFT ICA:                        78/22cm/sec CCA:                        102/15cm/sec SYSTOLIC ICA/CCA RATIO:     0.78 DIASTOLIC ICA/CCA RATIO:    1.43 ECA:                        51cm/sec  Findings:  RIGHT CAROTID ARTERY: Slight intimal thickening with minor atherosclerotic changes of the bifurcation.  Proximal right ICA tortuosity noted.  No hemodynamically significant ICA stenosis, velocity elevation, or turbulent flow.  RIGHT VERTEBRAL ARTERY:  Antegrade  LEFT CAROTID ARTERY: Mild intimal thickening and patchy atherosclerosis.  No hemodynamically significant ICA stenosis, velocity elevation, or turbulent flow.  LEFT VERTEBRAL ARTERY:  Antegrade  IMPRESSION: Mild plaque formation bilaterally.  No hemodynamically significant ICA stenosis.  Degree of narrowing less than 50% bilaterally.   Original Report Authenticated By: Judie Petit. Ruel Favors, M.D.   ZOX:[id number] Bradycardia: 48 bpm  ASSESSMENT AND PLAN:   1. Syncopal episode: Multifactorial. May be related to heat exhaustion, dehydration after prolonged exposure to heat by being outside. He was noted to be bradycardic and metoprolol has been discontinued. Agree with this. Blood pressure is stable currently, although it is noted that  his orthostatic blood pressures on admission were positive. Carotid studies were found to be negative. Observation is recommended at this time.   2. Hypertension:. Her blood pressure is currently stable, however it is noted that he was orthostatic on admission. Hydration has assisted. Repeat be met to evaluate renal function. May need to adjust amlodipine to lower dose if necessary with repeat orthostatic blood pressures to be completed today.  3. Multiple cardiovascular risk factors: Plan outpatient cardiac workup in the setting of multiple risk factors to include diabetes, hypertension, hypercholesterolemia, age, and thyroid disease. Currently EKG does not show any ischemic changes only bradycardia. Would check TSH, echocardiogram is pending. Further testing can be completed as an outpatient.  Signed: Bettey Mare. Lyman Bishop NP [facility] 07/31/2011, 8:34 AM Co-Sign MD I have taken a history, reviewed medications, allergies, PMH, SH, FH, and reviewed ROS and examined the patient.  I agree with the assessment and plan. I think his presentation can be explained by dehydration and severe orthostasis. Echo at bedside shows good LV systolic function with no significant valvular abnormalities. Will check orthostatics  this am and if no signficant drop can discharge this afternoon off metoprolol. We will arrange OP Myoview for Friday. Discussed with patient and wife.  Demonta Wombles C. Daleen Squibb, MD, [facility] Pager:  [phone]

## 2011-07-31 NOTE — Consult Note (Signed)
NAME:  Dylan Robles, Dylan Robles NO.:  [id number]  MEDICAL RECORD NO.:  [id number]  LOCATION:  A302                          FACILITY:  [facility]  PHYSICIAN:  Dorsey Charette A. Gerilyn Pilgrim, M.D. DATE OF BIRTH:  09-01-1940  DATE OF CONSULTATION:  07/31/2011 DATE OF DISCHARGE:                                CONSULTATION   REASON FOR CONSULTATION:  syncope.  This is a 71 year old black male who was outside couple days ago in the sun.  He was talking to his neighbor when he did develop significant sweating, diaphoresis especially of upper extremities.  The patient said it was hot outside.  Apparently he passed out by falling backwards.  He Loss consciousness but only briefly, again losing consciousness only briefly.  He denies any chest pain, shortness of breath, light headedness, dizziness. There is no focal numbness or weakness.  No headaches.  He did feel sort of weak afterwards, taken to the emergency room, was noted to have a pulse rate at 50.  He was also noted to be orthostatic on admission with lying blood pressure 167/98, heart rate of 83 standing; systolic 97 heart rate 95. The patient has done better.  He seem to be improved back to baseline. No other problems stated.  PHYSICAL EXAMINATION:  GENERAL:  Shows a very pleasant tall gentleman in no acute distress. HEENT:  Head is normocephalic, atraumatic. NECK:  Supple. ABDOMEN:  Soft. EXTREMITIES:  No edema. MENTATION:  He is awake and alert.  Speech, language, and cognition intact.  Cranial nerve evaluation shows the pupils are equal, round, reactive to light and accommodation.  Extraocular movements are full. He does have dense cataracts bilaterally.  Visual fields are full. Facial muscle strength is symmetric.  Tongue is midline.  Uvula midline. Shoulder shrugs normal.  Motor examination shows normal tone, bulk, and strength.  Reflexes are 2+ throughout.  Plantars are equivocal on the right and downgoing on the left.  Sensation  is symmetric to light touch bilaterally.  Coordination shows no dysmetria.  No tremors.  Carotid duplex Doppler shows no hemodynamic, significant stenosis.  ASSESSMENT:  Syncope in the setting of dehydration and likely orthostatic hypotension.  I suspect this is multifactorial, doubt cerebral ischemia or seizures.  The patient does have evidence of mild neuropathy likely due to diabetes which likely contributes to the orthostatic hypotension and syncope.  From my standpoint, no further workup is needed at this time.     Analie Katzman A. Gerilyn Pilgrim, M.D.     KAD/MEDQ  D:  07/31/2011  T:  07/31/2011  Job:  [id number]

## 2011-07-31 NOTE — Progress Notes (Signed)
*  PRELIMINARY RESULTS* Echocardiogram 2D Echocardiogram has been performed.  Caswell Corwin 07/31/2011, 11:05 AM

## 2011-07-31 NOTE — Consult Note (Signed)
Reason for Consult: Referring Physician:  [name] is an 71 y.o. male.  HPI:   Past Medical History  Diagnosis Date  . Hypertension   . Diabetes mellitus   . Hypothyroidism   . Hypercholesterolemia     Past Surgical History  Procedure Date  . Thyroid surgery   . Hernia repair     Family History  Problem Relation Age of Onset  . Diabetes Father   . Cancer Mother   . Diabetes Brother     Social History:  reports that he has never smoked. He does not have any smokeless tobacco history on file. He reports that he does not drink alcohol. His drug history not on file.  Allergies: No Known Allergies  Medications:  Prior to Admission medications   Medication Sig Start Date End Date Taking? Authorizing Provider  amLODipine (NORVASC) 10 MG tablet Take 10 mg by mouth daily.   Yes Historical Provider, MD  aspirin EC 81 MG tablet Take 81 mg by mouth at bedtime.   Yes Historical Provider, MD  benazepril (LOTENSIN) 20 MG tablet Take 20 mg by mouth daily.   Yes Historical Provider, MD  levothyroxine (SYNTHROID, LEVOTHROID) 88 MCG tablet Take 88 mcg by mouth daily.   Yes Historical Provider, MD  metFORMIN (GLUCOPHAGE) 1000 MG tablet Take 1,000 mg by mouth 2 (two) times daily.   Yes Historical Provider, MD  metoprolol succinate (TOPROL-XL) 100 MG 24 hr tablet Take 100 mg by mouth daily. Take with or immediately following a meal.   Yes Historical Provider, MD  simvastatin (ZOCOR) 80 MG tablet Take 80 mg by mouth at bedtime.   Yes Historical Provider, MD   Scheduled Meds:   . amLODipine  10 mg Oral Daily  . aspirin  81 mg Oral Daily  . atorvastatin  40 mg Oral QHS  . benazepril  20 mg Oral Daily  . enoxaparin (LOVENOX) injection  40 mg Subcutaneous Q24H  . insulin aspart  0-5 Units Subcutaneous QHS  . insulin aspart  0-9 Units Subcutaneous TID WC  . levothyroxine  88 mcg Oral QAC breakfast  . metFORMIN  500 mg Oral BID WC   Continuous Infusions:   . sodium chloride 20 mL/hr at  07/31/11 0547   PRN Meds:.ondansetron (ZOFRAN) IV   Results for orders placed during the hospital encounter of 07/28/11 (from the past 48 hour(s))  CARDIAC PANEL(CRET KIN+CKTOT+MB+TROPI)     Status: Abnormal   Collection Time   07/29/11 10:40 AM      Component Value Range Comment   Total CK 106  7 - 232 U/L    CK, MB 2.8  0.3 - 4.0 ng/mL    Troponin I <0.30  <0.30 ng/mL    Relative Index 2.6 (*) 0.0 - 2.5   GLUCOSE, CAPILLARY     Status: Abnormal   Collection Time   07/29/11 11:09 AM      Component Value Range Comment   Glucose-Capillary 211 (*) 70 - 99 mg/dL    Comment 1 Notify RN     GLUCOSE, CAPILLARY     Status: Abnormal   Collection Time   07/29/11  4:10 PM      Component Value Range Comment   Glucose-Capillary 122 (*) 70 - 99 mg/dL    Comment 1 Documented in Chart      Comment 2 Notify RN     GLUCOSE, CAPILLARY     Status: Abnormal   Collection Time   07/29/11  9:19 PM  Component Value Range Comment   Glucose-Capillary 177 (*) 70 - 99 mg/dL   GLUCOSE, CAPILLARY     Status: Abnormal   Collection Time   07/30/11  7:28 AM      Component Value Range Comment   Glucose-Capillary 164 (*) 70 - 99 mg/dL    Comment 1 Notify RN     GLUCOSE, CAPILLARY     Status: Abnormal   Collection Time   07/30/11 11:13 AM      Component Value Range Comment   Glucose-Capillary 192 (*) 70 - 99 mg/dL    Comment 1 Notify RN     GLUCOSE, CAPILLARY     Status: Abnormal   Collection Time   07/30/11  4:11 PM      Component Value Range Comment   Glucose-Capillary 145 (*) 70 - 99 mg/dL    Comment 1 Notify RN      Comment 2 Documented in Chart     GLUCOSE, CAPILLARY     Status: Abnormal   Collection Time   07/30/11  9:08 PM      Component Value Range Comment   Glucose-Capillary 181 (*) 70 - 99 mg/dL    Comment 1 Notify RN      Comment 2 Documented in Chart     CBC     Status: Abnormal   Collection Time   07/31/11  4:43 AM      Component Value Range Comment   WBC 6.8  4.0 - 10.5 K/uL     RBC 4.15 (*) 4.22 - 5.81 MIL/uL    Hemoglobin 12.0 (*) 13.0 - 17.0 g/dL    HCT 16.1 (*) 09.6 - 52.0 %    MCV 87.5  78.0 - 100.0 fL    MCH 28.9  26.0 - 34.0 pg    MCHC 33.1  30.0 - 36.0 g/dL    RDW 04.5  40.9 - 81.1 %    Platelets 235  150 - 400 K/uL   BASIC METABOLIC PANEL     Status: Abnormal   Collection Time   07/31/11  4:43 AM      Component Value Range Comment   Sodium 135  135 - 145 mEq/L    Potassium 4.2  3.5 - 5.1 mEq/L    Chloride 98  96 - 112 mEq/L    CO2 28  19 - 32 mEq/L    Glucose, Bld 197 (*) 70 - 99 mg/dL    BUN 20  6 - 23 mg/dL    Creatinine, Ser 9.14  0.50 - 1.35 mg/dL    Calcium 9.9  8.4 - 78.2 mg/dL    GFR calc non Af Amer 80 (*) >90 mL/min    GFR calc Af Amer >90  >90 mL/min   GLUCOSE, CAPILLARY     Status: Abnormal   Collection Time   07/31/11  7:47 AM      Component Value Range Comment   Glucose-Capillary 193 (*) 70 - 99 mg/dL    Comment 1 Notify RN       US Carotid Duplex Bilateral  07/29/2011  *RADIOLOGY REPORT*  Clinical Data: Syncope, hypertension  BILATERAL CAROTID DUPLEX ULTRASOUND  Technique: Gray scale imaging, color Doppler and duplex ultrasound was performed of bilateral carotid and vertebral arteries in the neck.  Comparison:  None.  Criteria:  Quantification of carotid stenosis is based on velocity parameters that correlate the residual internal carotid diameter with NASCET-based stenosis levels, using the diameter of the distal internal carotid lumen as  the denominator for stenosis measurement.  The following velocity measurements were obtained:                   PEAK SYSTOLIC/END DIASTOLIC RIGHT ICA:                        95/15cm/sec CCA:                        107/7cm/sec SYSTOLIC ICA/CCA RATIO:     0.88 DIASTOLIC ICA/CCA RATIO:    2.32 ECA:                        56cm/sec  LEFT ICA:                        78/22cm/sec CCA:                        102/15cm/sec SYSTOLIC ICA/CCA RATIO:     0.78 DIASTOLIC ICA/CCA RATIO:    1.43 ECA:                         51cm/sec  Findings:  RIGHT CAROTID ARTERY: Slight intimal thickening with minor atherosclerotic changes of the bifurcation.  Proximal right ICA tortuosity noted.  No hemodynamically significant ICA stenosis, velocity elevation, or turbulent flow.  RIGHT VERTEBRAL ARTERY:  Antegrade  LEFT CAROTID ARTERY: Mild intimal thickening and patchy atherosclerosis.  No hemodynamically significant ICA stenosis, velocity elevation, or turbulent flow.  LEFT VERTEBRAL ARTERY:  Antegrade  IMPRESSION: Mild plaque formation bilaterally.  No hemodynamically significant ICA stenosis.  Degree of narrowing less than 50% bilaterally.  Original Report Authenticated By: Judie Petit. Ruel Favors, M.D.    Review of Systems  Constitutional: Positive for diaphoresis.  HENT: Negative.   Eyes: Negative.   Respiratory: Negative.   Cardiovascular: Negative.   Gastrointestinal: Negative.   Genitourinary: Negative.   Musculoskeletal: Negative.   Skin: Negative.   Neurological: Positive for tingling.  Endo/Heme/Allergies: Negative.   Psychiatric/Behavioral: Negative.    Blood pressure 115/68, pulse 60, temperature 97.2 F (36.2 C), temperature source Oral, resp. rate 20, height 6\' 6"  (1.981 m), weight 95.482 kg (210 lb 8 oz), SpO2 97.00%. Physical Exam  Assessment/Plan: See dict  Dylan Robles 07/31/2011, 9:23 AM     Neurologic Exam

## 2011-07-31 NOTE — Progress Notes (Signed)
Inpatient Diabetes Program Recommendations  AACE/ADA: New Consensus Statement on Inpatient Glycemic Control (2009)  Target Ranges:  Prepandial:   less than 140 mg/dL      Peak postprandial:   less than 180 mg/dL (1-2 hours)      Critically ill patients:  140 - 180 mg/dL   Reason for Visit: Some elevated CBG's   Inpatient Diabetes Program Recommendations HgbA1C: Request MD order for Hgb A1C.  Note: Last known Hgb A1C was done 09/16/2008.

## 2011-07-31 NOTE — Care Management Note (Signed)
    Page 1 of 1   08/01/2011     11:19:11 AM   CARE MANAGEMENT NOTE 08/01/2011  Patient:  Dylan Robles, Dylan Robles   Account Number:  [id number]  Date Initiated:  07/31/2011  Documentation initiated by:  Sharrie Rothman  Subjective/Objective Assessment:   Pt admitted from home with syncope. Pt lives with wife and will return home at discharge. Pt is independent with ADL's.     Action/Plan:   No CM needs noted.   Anticipated DC Date:  08/01/2011   Anticipated DC Plan:  HOME/SELF CARE      DC Planning Services  CM consult      Choice offered to / List presented to:             Status of service:  Completed, signed off Medicare Important Message given?   (If response is "NO", the following Medicare IM given date fields will be blank) Date Medicare IM given:   Date Additional Medicare IM given:    Discharge Disposition:  HOME/SELF CARE  Per UR Regulation:    If discussed at Long Length of Stay Meetings, dates discussed:    Comments:  08/01/11 1118 Arlyss Queen, RN BSN CM Pt discharged home today. No CM needs noted.  07/31/11 1039 Arlyss Queen, RN BSN CM

## 2011-08-01 LAB — BASIC METABOLIC PANEL
BUN: 18 mg/dL (ref 6–23)
Chloride: 102 mEq/L (ref 96–112)
GFR calc Af Amer: >90 mL/min (ref >90)
Glucose, Bld: 174 mg/dL — ABNORMAL HIGH (ref 70–99)
Potassium: 4.2 mEq/L (ref 3.5–5.1)

## 2011-08-01 LAB — GLUCOSE, CAPILLARY: Glucose-Capillary: 173 mg/dL — ABNORMAL HIGH (ref 70–99)

## 2011-08-01 NOTE — Discharge Planning (Signed)
Physician Discharge Summary  Patient ID: Dylan Robles MRN: [id number] DOB/AGE: 02-06-1940 71 y.o. Primary Care Physician:Salmaan Patchin, MD Admit date: 07/28/2011 Discharge date: 08/01/2011    Discharge Diagnoses:  1.Syncopal episode 2. Bradyarrhythmia 3. DM type Ii 4. Hypertension 5. Hypothyroidism  Active Problems:  DIABETES MELLITUS, UNCONTROLLED, WITH COMPLICATIONS  HYPERTENSION  Bradycardia on ECG  Syncope and collapse   Medication List  As of 08/01/2011  8:37 AM   STOP taking these medications         metFORMIN 500 MG tablet         TAKE these medications         amLODipine 10 MG tablet   Commonly known as: NORVASC   Take 10 mg by mouth daily.      aspirin EC 81 MG tablet   Take 81 mg by mouth at bedtime.      benazepril 20 MG tablet   Commonly known as: LOTENSIN   Take 20 mg by mouth daily.      levothyroxine 88 MCG tablet   Commonly known as: SYNTHROID, LEVOTHROID   Take 88 mcg by mouth daily.      metFORMIN 1000 MG tablet   Commonly known as: GLUCOPHAGE   Take 1,000 mg by mouth 2 (two) times daily.      metoprolol succinate 100 MG 24 hr tablet   Commonly known as: TOPROL-XL   Take 100 mg by mouth daily. Take with or immediately following a meal.      simvastatin 80 MG tablet   Commonly known as: ZOCOR   Take 80 mg by mouth at bedtime.            Discharged Condition: Stable    Consults:Cardiology and neurolgy  Significant Diagnostic Studies: Dg Chest 2 View  07/28/2011  *RADIOLOGY REPORT*  Clinical Data: Loss of consciousness  CHEST - 2 VIEW  Comparison: None.  Findings: Heart size is normal.  Mediastinal shadows are normal. Lungs are clear.  The vascularity is normal.  No effusions.  No significant bony finding.  IMPRESSION: Normal chest  Original Report Authenticated By: Thomasenia Sales, M.D.   US Carotid Duplex Bilateral  07/29/2011  *RADIOLOGY REPORT*  Clinical Data: Syncope, hypertension  BILATERAL CAROTID DUPLEX ULTRASOUND   Technique: Gray scale imaging, color Doppler and duplex ultrasound was performed of bilateral carotid and vertebral arteries in the neck.  Comparison:  None.  Criteria:  Quantification of carotid stenosis is based on velocity parameters that correlate the residual internal carotid diameter with NASCET-based stenosis levels, using the diameter of the distal internal carotid lumen as the denominator for stenosis measurement.  The following velocity measurements were obtained:                   PEAK SYSTOLIC/END DIASTOLIC RIGHT ICA:                        95/15cm/sec CCA:                        107/7cm/sec SYSTOLIC ICA/CCA RATIO:     0.88 DIASTOLIC ICA/CCA RATIO:    2.32 ECA:                        56cm/sec  LEFT ICA:                        78/22cm/sec CCA:  102/15cm/sec SYSTOLIC ICA/CCA RATIO:     0.78 DIASTOLIC ICA/CCA RATIO:    1.43 ECA:                        51cm/sec  Findings:  RIGHT CAROTID ARTERY: Slight intimal thickening with minor atherosclerotic changes of the bifurcation.  Proximal right ICA tortuosity noted.  No hemodynamically significant ICA stenosis, velocity elevation, or turbulent flow.  RIGHT VERTEBRAL ARTERY:  Antegrade  LEFT CAROTID ARTERY: Mild intimal thickening and patchy atherosclerosis.  No hemodynamically significant ICA stenosis, velocity elevation, or turbulent flow.  LEFT VERTEBRAL ARTERY:  Antegrade  IMPRESSION: Mild plaque formation bilaterally.  No hemodynamically significant ICA stenosis.  Degree of narrowing less than 50% bilaterally.  Original Report Authenticated By: Judie Petit. Ruel Favors, M.D.    Lab Results: Basic Metabolic Panel:  Basename 08/01/11 0438 07/31/11 0443  NA 138 135  K 4.2 4.2  CL 102 98  CO2 28 28  GLUCOSE 174* 197*  BUN 18 20  CREATININE 0.87 0.96  CALCIUM 9.9 9.9  MG -- --  PHOS -- --   Liver Function Tests: No results found for this basename: AST:2,ALT:2,ALKPHOS:2,BILITOT:2,PROT:2,ALBUMIN:2 in the last 72  hours   CBC:  Basename 07/31/11 0443  WBC 6.8  NEUTROABS --  HGB 12.0*  HCT 36.3*  MCV 87.5  PLT 235    No results found for this or any previous visit (from the past 240 hour(s)).   Hospital Course:  Patient ws admitted due to syncopal episode. He had bradycardia. His EKG and serial cardiac enzymes were done and it was negative. He was seen by cardiology and neurology. He had Echo and was reviewed by cardiology. He will be followed by cardiology in out patient.  Discharge Exam: Blood pressure 97/60, pulse 89, temperature 98.2 F (36.8 C), temperature source Oral, resp. rate 20, height 6\' 6"  (1.981 m), weight 95.482 kg (210 lb 8 oz), SpO2 98.00%. **  Disposition: discharged home  Discharge Orders    Future Appointments: Provider: Department: Dept Phone: Center:   08/04/2011 11:00 AM [facility] [phone] [facility]      Follow-up Information    Follow up with [facility] on 08/04/2011. (Register at 8:30 for stress test. Nothing to eat after midnight on Thursday night. )       Follow up with Avon Gully, MD.   Contact information:   [address] [place] [address] [phone]          Signed: Avon Gully   08/01/2011, 8:37 AM

## 2011-08-01 NOTE — Progress Notes (Signed)
  Discharge instructions given to pt. And pt's wife with teach back given to RN per both. Pt. Placed in W/C and taken to car.

## 2011-08-02 ENCOUNTER — Other Ambulatory Visit: Payer: Self-pay | Admitting: Cardiology

## 2011-08-02 DIAGNOSIS — R079 Chest pain, unspecified: Secondary | ICD-10-CM

## 2011-08-04 ENCOUNTER — Encounter (HOSPITAL_COMMUNITY): Payer: Self-pay

## 2011-08-04 ENCOUNTER — Encounter (HOSPITAL_COMMUNITY)
Admission: RE | Admit: 2011-08-04 | Discharge: 2011-08-04 | Disposition: A | Discharge status: Home / Self Care | Payer: Medicare Other | Source: Ambulatory Visit | Attending: Cardiology | Admitting: Cardiology

## 2011-08-04 ENCOUNTER — Ambulatory Visit (INDEPENDENT_AMBULATORY_CARE_PROVIDER_SITE_OTHER): Payer: Medicare Other

## 2011-08-04 ENCOUNTER — Encounter (HOSPITAL_COMMUNITY): Payer: Self-pay | Admitting: Cardiology

## 2011-08-04 DIAGNOSIS — E119 Type 2 diabetes mellitus without complications: Secondary | ICD-10-CM | POA: Insufficient documentation

## 2011-08-04 DIAGNOSIS — R55 Syncope and collapse: Secondary | ICD-10-CM

## 2011-08-04 DIAGNOSIS — R001 Bradycardia, unspecified: Secondary | ICD-10-CM

## 2011-08-04 DIAGNOSIS — I10 Essential (primary) hypertension: Secondary | ICD-10-CM | POA: Insufficient documentation

## 2011-08-04 DIAGNOSIS — R079 Chest pain, unspecified: Secondary | ICD-10-CM | POA: Insufficient documentation

## 2011-08-04 DIAGNOSIS — E7800 Pure hypercholesterolemia, unspecified: Secondary | ICD-10-CM | POA: Insufficient documentation

## 2011-08-04 MED ORDER — TECHNETIUM TC 99M TETROFOSMIN IV KIT
30.0000 | PACK | Freq: Once | INTRAVENOUS | Status: AC | PRN
  Administered 2011-08-04: 31 via INTRAVENOUS

## 2011-08-04 MED ORDER — TECHNETIUM TC 99M TETROFOSMIN IV KIT
10.0000 | PACK | Freq: Once | INTRAVENOUS | Status: AC | PRN
  Administered 2011-08-04: 10.5 via INTRAVENOUS

## 2011-08-04 NOTE — Progress Notes (Signed)
Stress Lab Nurses Notes - Dylan Robles  Dylan Robles 08/04/2011  Reason for doing test: Syncope and Post Hospital  Type of test: Stress Myoview  Nurse performing test: Marlena Clipper, RN  Nuclear Medicine Tech: Lou Cal  Echo Tech: Not Applicable  MD performing test: Wall and Joni Reining NP  Family MD: Dr. Felecia Shelling  Test explained and consent signed: yes  IV started: 24g jelco, Saline lock flushed, No redness or edema and Saline lock started in radiology  Symptoms: Tired and SOB  Treatment/Intervention: None  Reason test stopped: fatigue, reached target HR and SOB  After recovery IV was: Discontinued via X-ray tech, No redness or edema and Saline Lock flushed  Patient to return to Nuc. Med at :11:10  Patient discharged: Home  Patient's Condition upon discharge was: stable  Comments: Patient walked 5 minutes and 50 seconds. His resting Hr was 80 and his resting BP was 110/70. His peak HR was 144 and his peak BP was 160/70. Symptoms resolved in recovery.  [name]

## 2011-11-30 ENCOUNTER — Encounter: Payer: Self-pay | Admitting: Family Medicine

## 2011-11-30 ENCOUNTER — Ambulatory Visit (INDEPENDENT_AMBULATORY_CARE_PROVIDER_SITE_OTHER): Payer: Medicare Other | Admitting: Family Medicine

## 2011-11-30 VITALS — BP 150/80 | HR 87 | Resp 18 | Ht 78.0 in | Wt 221.0 lb

## 2011-11-30 DIAGNOSIS — E118 Type 2 diabetes mellitus with unspecified complications: Secondary | ICD-10-CM

## 2011-11-30 DIAGNOSIS — IMO0002 Reserved for concepts with insufficient information to code with codable children: Secondary | ICD-10-CM

## 2011-11-30 DIAGNOSIS — E039 Hypothyroidism, unspecified: Secondary | ICD-10-CM

## 2011-11-30 DIAGNOSIS — E1165 Type 2 diabetes mellitus with hyperglycemia: Secondary | ICD-10-CM

## 2011-11-30 DIAGNOSIS — E785 Hyperlipidemia, unspecified: Secondary | ICD-10-CM

## 2011-11-30 DIAGNOSIS — I10 Essential (primary) hypertension: Secondary | ICD-10-CM

## 2011-11-30 NOTE — Patient Instructions (Signed)
Continue your current medications I will get the records Read handout of foods for diabetes  I recommend you get flu shot from pharmacy  F/U 4 weeks- Bring your meter

## 2011-12-03 ENCOUNTER — Encounter: Payer: Self-pay | Admitting: Family Medicine

## 2011-12-03 NOTE — Assessment & Plan Note (Signed)
Peripheral neuropathy, he is having hypoglycemia, unknown last A1C, continue levemir 10 units, given handout on foods, importance of regular meal times and not missing insulin doses I am confused if he is following currently with Dr. Fransico Him or Dr. Felecia Shelling for his DM

## 2011-12-03 NOTE — Progress Notes (Signed)
  Subjective:    Patient ID: Dylan Robles, male    DOB: [birth year], 71 y.o.   MRN: [id number]  HPI  Pt here to establish care, previous PCP Dr. Felecia Shelling, Endocrine - Dr. Fransico Him Medications and history reviewed DM > 10 years, uncontrolled, on levemir 20 units, has had some hypoglycemia 50-60 in the morning, he then decreased to 10 units, also on slide scale with meals. He does not take insulin on regular basis and often misses lunch doses. On statin therapy and ARB Thyroid disorder- had thryoid removed many years ago but not sure if this was due to cancer or large goiter, on replacement hormone  Review of Systems    GEN- denies fatigue, fever, weight loss,weakness, recent illness HEENT- denies eye drainage, change in vision, nasal discharge, CVS- denies chest pain, palpitations RESP- denies SOB, cough, wheeze ABD- denies N/V, change in stools, abd pain GU- denies dysuria, hematuria, dribbling, incontinence MSK- denies joint pain, muscle aches, injury Neuro- denies headache, dizziness, syncope, seizure activity      Objective:   Physical Exam  GEN- NAD, alert and oriented x3 HEENT- PERRL, EOMI, non injected sclera, pink conjunctiva, MMM, oropharynx clear Neck- Supple,  CVS- RRR, no murmur RESP-CTAB ABS-NABS,soft,NT,ND EXT- No edema Pulses- Radial, DP- 2+ Psych-normal affect and mood       Assessment & Plan:

## 2011-12-03 NOTE — Assessment & Plan Note (Signed)
BP elevated today, no change to meds will monitor

## 2011-12-03 NOTE — Assessment & Plan Note (Signed)
High dose zocor, will review his FLP, he may need to be switched to lipitor or crestor

## 2011-12-03 NOTE — Assessment & Plan Note (Signed)
On replacement, records to be reviewed

## 2011-12-28 ENCOUNTER — Ambulatory Visit: Payer: Medicare Other | Admitting: Family Medicine

## 2012-01-01 ENCOUNTER — Ambulatory Visit (INDEPENDENT_AMBULATORY_CARE_PROVIDER_SITE_OTHER): Payer: Medicare Other | Admitting: Family Medicine

## 2012-01-01 ENCOUNTER — Encounter: Payer: Self-pay | Admitting: Family Medicine

## 2012-01-01 VITALS — BP 130/70 | HR 83 | Resp 16 | Ht 78.0 in | Wt 219.0 lb

## 2012-01-01 DIAGNOSIS — I10 Essential (primary) hypertension: Secondary | ICD-10-CM

## 2012-01-01 DIAGNOSIS — E1165 Type 2 diabetes mellitus with hyperglycemia: Secondary | ICD-10-CM

## 2012-01-01 DIAGNOSIS — IMO0002 Reserved for concepts with insufficient information to code with codable children: Secondary | ICD-10-CM

## 2012-01-01 DIAGNOSIS — E118 Type 2 diabetes mellitus with unspecified complications: Secondary | ICD-10-CM

## 2012-01-01 MED ORDER — INSULIN ASPART 100 UNIT/ML ~~LOC~~ SOLN: SUBCUTANEOUS | Status: DC

## 2012-01-01 MED ORDER — INSULIN DETEMIR 100 UNIT/ML ~~LOC~~ SOLN: SUBCUTANEOUS | Status: DC

## 2012-01-01 NOTE — Patient Instructions (Addendum)
Levemir 10 units at bedtime Novolog 5 units with each meal  Continue your pills  Needles to be refilled  Schedule an appt with Dr. Fransico Him Bring me the letter Get your flu shot at the pharmacy- [facility]  F/U 3 months

## 2012-01-04 ENCOUNTER — Encounter: Payer: Self-pay | Admitting: Family Medicine

## 2012-01-04 NOTE — Assessment & Plan Note (Signed)
Blood pressure looks good today no change the medication 

## 2012-01-04 NOTE — Progress Notes (Signed)
  Subjective:    Patient ID: Dylan Robles, male    DOB: 04/05/1940, 71 y.o.   MRN: [id number]  HPI  Patient here to followup diabetes mellitus. I did receive his records from his endocrinologist in previous PCP. His last A1c was 8.7% in October 2013. His fasting blood sugars have been 90-120 has been giving himself Levemir 10 units however at mealtimes he's been given various amounts of that he is presently on 5 units with each meal plus sliding scale. He has not had any hypoglycemia but he is fearful of this.  Review of Systems  - per above    GEN- denies fatigue, fever, weight loss,weakness, recent illness CVS- denies chest pain, palpitations RESP- denies SOB, cough, wheeze Neuro- denies headache, dizziness, syncope, seizure activity      Objective:   Physical Exam  GEN-NAD,alert and oriented x 3  CVS-RRR, no murmur RESP-CTAB EXT- No edema, Radial 2+     Assessment & Plan:

## 2012-01-04 NOTE — Assessment & Plan Note (Signed)
Uncontrolled diabetes mellitus. His this to be a very brittle diabetic so I will have him continue following with his endocrinologist Dr. Fransico Him he is to reschedule this appointment. At this time he will continue Levemir 10 units and 5 units with each meal we will hold on a sliding scale since he has some difficulty understanding what he is supposed to be doing.

## 2012-01-26 ENCOUNTER — Other Ambulatory Visit: Payer: Self-pay

## 2012-01-26 MED ORDER — AMLODIPINE BESYLATE 10 MG PO TABS: 10.0000 mg | ORAL_TABLET | Freq: Every day | ORAL | Status: DC

## 2012-01-26 MED ORDER — BENAZEPRIL HCL 20 MG PO TABS: 40.0000 mg | ORAL_TABLET | Freq: Every day | ORAL | Status: DC

## 2012-01-26 MED ORDER — SIMVASTATIN 80 MG PO TABS: 80.0000 mg | ORAL_TABLET | Freq: Every day | ORAL | Status: DC

## 2012-01-26 MED ORDER — LEVOTHYROXINE SODIUM 88 MCG PO TABS: 88.0000 ug | ORAL_TABLET | Freq: Every day | ORAL | Status: DC

## 2012-01-26 MED ORDER — METFORMIN HCL 1000 MG PO TABS: 1000.0000 mg | ORAL_TABLET | Freq: Two times a day (BID) | ORAL | Status: DC

## 2012-02-05 ENCOUNTER — Other Ambulatory Visit: Payer: Self-pay | Admitting: Family Medicine

## 2012-02-05 MED ORDER — ATORVASTATIN CALCIUM 40 MG PO TABS: 40.0000 mg | ORAL_TABLET | Freq: Every day | ORAL | Status: DC

## 2012-04-04 ENCOUNTER — Ambulatory Visit: Payer: Medicare Other | Admitting: Family Medicine

## 2014-03-15 ENCOUNTER — Emergency Department: Payer: Self-pay | Admitting: Emergency Medicine

## 2014-03-15 DIAGNOSIS — I10 Essential (primary) hypertension: Secondary | ICD-10-CM | POA: Diagnosis not present

## 2014-03-15 DIAGNOSIS — E119 Type 2 diabetes mellitus without complications: Secondary | ICD-10-CM | POA: Diagnosis not present

## 2014-03-15 DIAGNOSIS — M25522 Pain in left elbow: Secondary | ICD-10-CM | POA: Diagnosis not present

## 2014-03-15 DIAGNOSIS — Z794 Long term (current) use of insulin: Secondary | ICD-10-CM | POA: Diagnosis not present

## 2014-03-15 DIAGNOSIS — R112 Nausea with vomiting, unspecified: Secondary | ICD-10-CM | POA: Diagnosis not present

## 2014-03-15 DIAGNOSIS — Z79899 Other long term (current) drug therapy: Secondary | ICD-10-CM | POA: Diagnosis not present

## 2014-03-15 DIAGNOSIS — S59902A Unspecified injury of left elbow, initial encounter: Secondary | ICD-10-CM | POA: Diagnosis not present

## 2014-03-15 DIAGNOSIS — S0990XA Unspecified injury of head, initial encounter: Secondary | ICD-10-CM | POA: Diagnosis not present

## 2014-03-15 DIAGNOSIS — Z87891 Personal history of nicotine dependence: Secondary | ICD-10-CM | POA: Diagnosis not present

## 2014-03-15 DIAGNOSIS — R55 Syncope and collapse: Secondary | ICD-10-CM | POA: Diagnosis not present

## 2014-03-15 DIAGNOSIS — R42 Dizziness and giddiness: Secondary | ICD-10-CM | POA: Diagnosis not present

## 2014-03-15 DIAGNOSIS — E162 Hypoglycemia, unspecified: Secondary | ICD-10-CM | POA: Diagnosis not present

## 2014-03-19 DIAGNOSIS — I10 Essential (primary) hypertension: Secondary | ICD-10-CM | POA: Diagnosis not present

## 2014-03-19 DIAGNOSIS — R55 Syncope and collapse: Secondary | ICD-10-CM | POA: Diagnosis not present

## 2014-03-19 DIAGNOSIS — E785 Hyperlipidemia, unspecified: Secondary | ICD-10-CM | POA: Diagnosis not present

## 2014-03-24 DIAGNOSIS — R079 Chest pain, unspecified: Secondary | ICD-10-CM | POA: Diagnosis not present

## 2014-03-27 DIAGNOSIS — I340 Nonrheumatic mitral (valve) insufficiency: Secondary | ICD-10-CM | POA: Diagnosis not present

## 2014-03-27 DIAGNOSIS — R55 Syncope and collapse: Secondary | ICD-10-CM | POA: Diagnosis not present

## 2014-03-27 DIAGNOSIS — I10 Essential (primary) hypertension: Secondary | ICD-10-CM | POA: Diagnosis not present

## 2014-03-27 DIAGNOSIS — E785 Hyperlipidemia, unspecified: Secondary | ICD-10-CM | POA: Diagnosis not present

## 2014-03-27 DIAGNOSIS — E139 Other specified diabetes mellitus without complications: Secondary | ICD-10-CM | POA: Diagnosis not present

## 2014-03-27 LAB — COMPREHENSIVE METABOLIC PANEL
Albumin: 4 (ref 3.5–5.0)
Calcium: 9.6 (ref 8.7–10.7)

## 2014-03-27 LAB — HEPATIC FUNCTION PANEL
ALT: 8 — AB (ref 10–40)
AST: 10 — AB (ref 14–40)
Alkaline Phosphatase: 90 (ref 25–125)
Bilirubin, Total: 0.6

## 2014-03-27 LAB — BASIC METABOLIC PANEL
BUN: 17 (ref 4–21)
CO2: 24 — AB (ref 13–22)
Chloride: 101 (ref 99–108)
Creatinine: 0.9 (ref 0.6–1.3)
Glucose: 265
Potassium: 4.6 (ref 3.4–5.3)
Sodium: 135 — AB (ref 137–147)

## 2014-03-27 LAB — TSH: TSH: 0.26 — AB (ref 0.41–5.90)

## 2014-04-01 DIAGNOSIS — R55 Syncope and collapse: Secondary | ICD-10-CM | POA: Diagnosis not present

## 2014-04-02 DIAGNOSIS — I10 Essential (primary) hypertension: Secondary | ICD-10-CM | POA: Diagnosis not present

## 2014-04-02 DIAGNOSIS — I739 Peripheral vascular disease, unspecified: Secondary | ICD-10-CM | POA: Diagnosis not present

## 2014-04-02 DIAGNOSIS — E785 Hyperlipidemia, unspecified: Secondary | ICD-10-CM | POA: Diagnosis not present

## 2014-04-02 DIAGNOSIS — I340 Nonrheumatic mitral (valve) insufficiency: Secondary | ICD-10-CM | POA: Diagnosis not present

## 2014-04-02 DIAGNOSIS — R9430 Abnormal result of cardiovascular function study, unspecified: Secondary | ICD-10-CM | POA: Diagnosis not present

## 2014-04-02 DIAGNOSIS — I2510 Atherosclerotic heart disease of native coronary artery without angina pectoris: Secondary | ICD-10-CM | POA: Diagnosis not present

## 2014-04-06 DIAGNOSIS — E785 Hyperlipidemia, unspecified: Secondary | ICD-10-CM | POA: Diagnosis not present

## 2014-04-06 DIAGNOSIS — I10 Essential (primary) hypertension: Secondary | ICD-10-CM | POA: Diagnosis not present

## 2014-04-06 DIAGNOSIS — I340 Nonrheumatic mitral (valve) insufficiency: Secondary | ICD-10-CM | POA: Diagnosis not present

## 2014-04-06 DIAGNOSIS — I2510 Atherosclerotic heart disease of native coronary artery without angina pectoris: Secondary | ICD-10-CM | POA: Diagnosis not present

## 2014-04-06 DIAGNOSIS — I739 Peripheral vascular disease, unspecified: Secondary | ICD-10-CM | POA: Diagnosis not present

## 2014-04-06 DIAGNOSIS — R9430 Abnormal result of cardiovascular function study, unspecified: Secondary | ICD-10-CM | POA: Diagnosis not present

## 2014-04-20 DIAGNOSIS — I10 Essential (primary) hypertension: Secondary | ICD-10-CM | POA: Diagnosis not present

## 2014-04-20 DIAGNOSIS — E1165 Type 2 diabetes mellitus with hyperglycemia: Secondary | ICD-10-CM | POA: Diagnosis not present

## 2014-05-07 DIAGNOSIS — I340 Nonrheumatic mitral (valve) insufficiency: Secondary | ICD-10-CM | POA: Diagnosis not present

## 2014-05-07 DIAGNOSIS — I10 Essential (primary) hypertension: Secondary | ICD-10-CM | POA: Diagnosis not present

## 2014-05-07 DIAGNOSIS — I739 Peripheral vascular disease, unspecified: Secondary | ICD-10-CM | POA: Diagnosis not present

## 2014-05-07 DIAGNOSIS — E785 Hyperlipidemia, unspecified: Secondary | ICD-10-CM | POA: Diagnosis not present

## 2014-05-07 DIAGNOSIS — D440 Neoplasm of uncertain behavior of thyroid gland: Secondary | ICD-10-CM | POA: Diagnosis not present

## 2014-05-07 DIAGNOSIS — I2510 Atherosclerotic heart disease of native coronary artery without angina pectoris: Secondary | ICD-10-CM | POA: Diagnosis not present

## 2014-05-13 DIAGNOSIS — D440 Neoplasm of uncertain behavior of thyroid gland: Secondary | ICD-10-CM | POA: Diagnosis not present

## 2014-05-18 DIAGNOSIS — E1165 Type 2 diabetes mellitus with hyperglycemia: Secondary | ICD-10-CM | POA: Diagnosis not present

## 2014-05-18 DIAGNOSIS — I10 Essential (primary) hypertension: Secondary | ICD-10-CM | POA: Diagnosis not present

## 2014-05-18 DIAGNOSIS — N401 Enlarged prostate with lower urinary tract symptoms: Secondary | ICD-10-CM | POA: Diagnosis not present

## 2014-05-18 DIAGNOSIS — L298 Other pruritus: Secondary | ICD-10-CM | POA: Diagnosis not present

## 2014-05-18 DIAGNOSIS — Z125 Encounter for screening for malignant neoplasm of prostate: Secondary | ICD-10-CM | POA: Diagnosis not present

## 2014-06-08 DIAGNOSIS — R079 Chest pain, unspecified: Secondary | ICD-10-CM | POA: Diagnosis not present

## 2014-06-08 DIAGNOSIS — E785 Hyperlipidemia, unspecified: Secondary | ICD-10-CM | POA: Diagnosis not present

## 2014-06-08 DIAGNOSIS — I10 Essential (primary) hypertension: Secondary | ICD-10-CM | POA: Diagnosis not present

## 2014-06-08 DIAGNOSIS — I2510 Atherosclerotic heart disease of native coronary artery without angina pectoris: Secondary | ICD-10-CM | POA: Diagnosis not present

## 2014-06-08 DIAGNOSIS — I340 Nonrheumatic mitral (valve) insufficiency: Secondary | ICD-10-CM | POA: Diagnosis not present

## 2014-06-09 DIAGNOSIS — I2510 Atherosclerotic heart disease of native coronary artery without angina pectoris: Secondary | ICD-10-CM | POA: Diagnosis not present

## 2014-06-09 LAB — BASIC METABOLIC PANEL
BUN: 14 (ref 4–21)
CO2: 24 — AB (ref 13–22)
Chloride: 99 (ref 99–108)
Creatinine: 1 (ref 0.6–1.3)
Glucose: 456
Potassium: 4.8 (ref 3.4–5.3)
Sodium: 136 — AB (ref 137–147)

## 2014-06-09 LAB — HEPATIC FUNCTION PANEL
ALT: 8 — AB (ref 10–40)
AST: 10 — AB (ref 14–40)
Alkaline Phosphatase: 86 (ref 25–125)
Bilirubin, Total: 0.6

## 2014-06-09 LAB — CBC AND DIFFERENTIAL
HCT: 38 — AB (ref 41–53)
Hemoglobin: 12.3 — AB (ref 13.5–17.5)
Platelets: 249 (ref 150–399)
WBC: 8

## 2014-06-09 LAB — COMPREHENSIVE METABOLIC PANEL
Albumin: 3.9 (ref 3.5–5.0)
Calcium: 9.3 (ref 8.7–10.7)

## 2014-06-09 LAB — PROTIME-INR: Protime: 12.7 (ref 10.0–13.8)

## 2014-06-09 LAB — CBC: RBC: 4.2 (ref 3.87–5.11)

## 2014-06-15 ENCOUNTER — Encounter: Payer: Self-pay | Admitting: *Deleted

## 2014-06-16 ENCOUNTER — Ambulatory Visit
Admission: RE | Admit: 2014-06-16 | Discharge: 2014-06-16 | Disposition: A | Discharge status: Home / Self Care | Payer: Commercial Managed Care - HMO | Source: Ambulatory Visit | Attending: Cardiovascular Disease | Admitting: Cardiovascular Disease

## 2014-06-16 ENCOUNTER — Encounter
Admission: RE | Disposition: A | Discharge status: Home / Self Care | Payer: Self-pay | Source: Ambulatory Visit | Attending: Cardiovascular Disease

## 2014-06-16 ENCOUNTER — Encounter: Payer: Self-pay | Admitting: *Deleted

## 2014-06-16 DIAGNOSIS — I2510 Atherosclerotic heart disease of native coronary artery without angina pectoris: Secondary | ICD-10-CM | POA: Diagnosis not present

## 2014-06-16 DIAGNOSIS — R55 Syncope and collapse: Secondary | ICD-10-CM | POA: Diagnosis present

## 2014-06-16 DIAGNOSIS — Z87891 Personal history of nicotine dependence: Secondary | ICD-10-CM | POA: Diagnosis not present

## 2014-06-16 DIAGNOSIS — Z809 Family history of malignant neoplasm, unspecified: Secondary | ICD-10-CM | POA: Diagnosis not present

## 2014-06-16 DIAGNOSIS — I10 Essential (primary) hypertension: Secondary | ICD-10-CM | POA: Diagnosis not present

## 2014-06-16 DIAGNOSIS — Z79899 Other long term (current) drug therapy: Secondary | ICD-10-CM | POA: Diagnosis not present

## 2014-06-16 DIAGNOSIS — E785 Hyperlipidemia, unspecified: Secondary | ICD-10-CM | POA: Diagnosis not present

## 2014-06-16 DIAGNOSIS — Z794 Long term (current) use of insulin: Secondary | ICD-10-CM | POA: Diagnosis not present

## 2014-06-16 DIAGNOSIS — E119 Type 2 diabetes mellitus without complications: Secondary | ICD-10-CM | POA: Insufficient documentation

## 2014-06-16 DIAGNOSIS — I200 Unstable angina: Secondary | ICD-10-CM | POA: Diagnosis not present

## 2014-06-16 HISTORY — DX: Type 2 diabetes mellitus without complications: E11.9

## 2014-06-16 HISTORY — DX: Reserved for inherently not codable concepts without codable children: IMO0001

## 2014-06-16 HISTORY — DX: Syncope and collapse: R55

## 2014-06-16 HISTORY — DX: Atherosclerotic heart disease of native coronary artery without angina pectoris: I25.10

## 2014-06-16 HISTORY — PX: CARDIAC CATHETERIZATION: SHX172

## 2014-06-16 LAB — GLUCOSE, CAPILLARY
GLUCOSE-CAPILLARY: 346 mg/dL — AB (ref 65–99)
Glucose-Capillary: 349 mg/dL — ABNORMAL HIGH (ref 65–99)

## 2014-06-16 SURGERY — LEFT HEART CATH
Anesthesia: Moderate Sedation

## 2014-06-16 SURGERY — RIGHT/LEFT HEART CATH AND CORONARY ANGIOGRAPHY
Anesthesia: Moderate Sedation | Laterality: Left

## 2014-06-16 MED ORDER — PRASUGREL HCL 10 MG PO TABS
ORAL_TABLET | ORAL | Status: AC
  Filled 2014-06-16: qty 6

## 2014-06-16 MED ORDER — MIDAZOLAM HCL 2 MG/2ML IJ SOLN
INTRAMUSCULAR | Status: DC | PRN
  Administered 2014-06-16 (×2): 1 mg via INTRAVENOUS

## 2014-06-16 MED ORDER — HEPARIN (PORCINE) IN NACL 2-0.9 UNIT/ML-% IJ SOLN
INTRAMUSCULAR | Status: AC
  Filled 2014-06-16: qty 1000

## 2014-06-16 MED ORDER — IOHEXOL 300 MG/ML  SOLN
INTRAMUSCULAR | Status: DC | PRN
  Administered 2014-06-16 (×2): 30 mL via INTRA_ARTERIAL
  Administered 2014-06-16: 100 mL via INTRA_ARTERIAL

## 2014-06-16 MED ORDER — MIDAZOLAM HCL 2 MG/2ML IJ SOLN
INTRAMUSCULAR | Status: AC
  Filled 2014-06-16: qty 2

## 2014-06-16 MED ORDER — METOPROLOL TARTRATE 1 MG/ML IV SOLN
INTRAVENOUS | Status: DC | PRN
  Administered 2014-06-16: 2.5 mg via INTRAVENOUS

## 2014-06-16 MED ORDER — NITROGLYCERIN 5 MG/ML IV SOLN
INTRAVENOUS | Status: AC
  Filled 2014-06-16: qty 10

## 2014-06-16 MED ORDER — SODIUM CHLORIDE 0.9 % IJ SOLN
3.0000 mL | INTRAMUSCULAR | Status: DC | PRN
  Administered 2014-06-16: 10 mL via INTRAVENOUS
  Filled 2014-06-16: qty 10

## 2014-06-16 MED ORDER — BIVALIRUDIN 250 MG IV SOLR
INTRAVENOUS | Status: AC
  Filled 2014-06-16: qty 250

## 2014-06-16 MED ORDER — SODIUM CHLORIDE 0.9 % IV SOLN: 250.0000 mL | INTRAVENOUS | Status: DC | PRN

## 2014-06-16 MED ORDER — INSULIN ASPART 100 UNIT/ML ~~LOC~~ SOLN
SUBCUTANEOUS | Status: AC
  Filled 2014-06-16: qty 1

## 2014-06-16 MED ORDER — INSULIN ASPART 100 UNIT/ML ~~LOC~~ SOLN
10.0000 [IU] | Freq: Once | SUBCUTANEOUS | Status: AC
  Administered 2014-06-16: 10 [IU] via SUBCUTANEOUS

## 2014-06-16 MED ORDER — ASPIRIN 81 MG PO CHEW
CHEWABLE_TABLET | ORAL | Status: DC | PRN
Start: 2014-06-16 — End: 2014-06-16
  Administered 2014-06-16: 324 mg via ORAL

## 2014-06-16 MED ORDER — ONDANSETRON HCL 4 MG/2ML IJ SOLN: 4.0000 mg | Freq: Four times a day (QID) | INTRAMUSCULAR | Status: DC | PRN

## 2014-06-16 MED ORDER — SODIUM CHLORIDE 0.9 % IJ SOLN: 3.0000 mL | Freq: Two times a day (BID) | INTRAMUSCULAR | Status: DC

## 2014-06-16 MED ORDER — NITROGLYCERIN 1 MG/10 ML FOR IR/CATH LAB
INTRA_ARTERIAL | Status: DC | PRN
  Administered 2014-06-16: 200 ug via INTRACORONARY

## 2014-06-16 MED ORDER — FENTANYL CITRATE (PF) 100 MCG/2ML IJ SOLN
INTRAMUSCULAR | Status: DC | PRN
  Administered 2014-06-16 (×2): 50 ug via INTRAVENOUS

## 2014-06-16 MED ORDER — SODIUM CHLORIDE 0.9 % IV SOLN
250.0000 mg | INTRAVENOUS | Status: DC | PRN
  Administered 2014-06-16: 1.75 mg/kg/h via INTRAVENOUS

## 2014-06-16 MED ORDER — ACETAMINOPHEN 325 MG PO TABS: 650.0000 mg | ORAL_TABLET | ORAL | Status: DC | PRN

## 2014-06-16 MED ORDER — METOPROLOL TARTRATE 1 MG/ML IV SOLN
INTRAVENOUS | Status: AC
  Filled 2014-06-16: qty 5

## 2014-06-16 MED ORDER — BIVALIRUDIN BOLUS VIA INFUSION - CUPID
INTRAVENOUS | Status: DC | PRN
  Administered 2014-06-16: 73.125 mg via INTRAVENOUS

## 2014-06-16 MED ORDER — ASPIRIN 81 MG PO CHEW
CHEWABLE_TABLET | ORAL | Status: AC
  Filled 2014-06-16: qty 4

## 2014-06-16 MED ORDER — FENTANYL CITRATE (PF) 100 MCG/2ML IJ SOLN
INTRAMUSCULAR | Status: AC
  Filled 2014-06-16: qty 2

## 2014-06-16 MED ORDER — IOHEXOL 300 MG/ML  SOLN
INTRAMUSCULAR | Status: DC | PRN
  Administered 2014-06-16: 150 mL via INTRA_ARTERIAL
  Administered 2014-06-16: 25 mL via INTRA_ARTERIAL

## 2014-06-16 MED ORDER — SODIUM CHLORIDE 0.9 % IV SOLN
INTRAVENOUS | Status: DC
  Administered 2014-06-16: 07:00:00 via INTRAVENOUS

## 2014-06-16 SURGICAL SUPPLY — 18 items
BALLN TREK RX 2.25X15 (BALLOONS) ×4
BALLOON TREK RX 2.25X15 (BALLOONS) ×2 IMPLANT
CATH INFINITI 5 FR 3DRC (CATHETERS) ×4 IMPLANT
CATH INFINITI 5FR ANG PIGTAIL (CATHETERS) ×4 IMPLANT
CATH INFINITI 5FR JL4 (CATHETERS) ×4 IMPLANT
CATH INFINITI JR4 5F (CATHETERS) ×3 IMPLANT
CATH VISTA GUIDE 6FR JR4 SH (CATHETERS) ×4 IMPLANT
DEVICE CLOSURE MYNXGRIP 6/7F (Vascular Products) ×4 IMPLANT
DEVICE INFLAT 30 PLUS (MISCELLANEOUS) ×4 IMPLANT
KIT MANI 3VAL PERCEP (MISCELLANEOUS) ×4 IMPLANT
NDL PERC 18GX7CM (NEEDLE) IMPLANT
NEEDLE PERC 18GX7CM (NEEDLE) ×4 IMPLANT
PACK CARDIAC CATH (CUSTOM PROCEDURE TRAY) ×4 IMPLANT
SHEATH AVANTI 6FR X 11CM (SHEATH) ×3 IMPLANT
SHEATH PINNACLE 5F 10CM (SHEATH) ×3 IMPLANT
WIRE ASAHI PROWATER 180CM (WIRE) ×6 IMPLANT
WIRE EMERALD 3MM-J .035X150CM (WIRE) ×4 IMPLANT
WIRE G HI TQ BMW 190 (WIRE) ×4 IMPLANT

## 2014-06-16 NOTE — Discharge Instructions (Signed)

## 2014-06-18 DIAGNOSIS — E785 Hyperlipidemia, unspecified: Secondary | ICD-10-CM | POA: Diagnosis not present

## 2014-06-18 DIAGNOSIS — I10 Essential (primary) hypertension: Secondary | ICD-10-CM | POA: Diagnosis not present

## 2014-06-18 DIAGNOSIS — I340 Nonrheumatic mitral (valve) insufficiency: Secondary | ICD-10-CM | POA: Diagnosis not present

## 2014-06-18 DIAGNOSIS — I2510 Atherosclerotic heart disease of native coronary artery without angina pectoris: Secondary | ICD-10-CM | POA: Diagnosis not present

## 2014-06-19 DIAGNOSIS — I2510 Atherosclerotic heart disease of native coronary artery without angina pectoris: Secondary | ICD-10-CM | POA: Diagnosis not present

## 2014-06-19 DIAGNOSIS — I10 Essential (primary) hypertension: Secondary | ICD-10-CM | POA: Diagnosis not present

## 2014-06-19 DIAGNOSIS — E785 Hyperlipidemia, unspecified: Secondary | ICD-10-CM | POA: Diagnosis not present

## 2014-06-22 DIAGNOSIS — R55 Syncope and collapse: Secondary | ICD-10-CM | POA: Diagnosis not present

## 2014-06-22 DIAGNOSIS — I2510 Atherosclerotic heart disease of native coronary artery without angina pectoris: Secondary | ICD-10-CM | POA: Diagnosis not present

## 2014-06-22 DIAGNOSIS — E785 Hyperlipidemia, unspecified: Secondary | ICD-10-CM | POA: Diagnosis not present

## 2014-06-22 DIAGNOSIS — I340 Nonrheumatic mitral (valve) insufficiency: Secondary | ICD-10-CM | POA: Diagnosis not present

## 2014-06-22 DIAGNOSIS — I10 Essential (primary) hypertension: Secondary | ICD-10-CM | POA: Diagnosis not present

## 2014-07-02 DIAGNOSIS — R0602 Shortness of breath: Secondary | ICD-10-CM | POA: Diagnosis not present

## 2014-07-02 DIAGNOSIS — R55 Syncope and collapse: Secondary | ICD-10-CM | POA: Diagnosis not present

## 2014-07-02 DIAGNOSIS — E785 Hyperlipidemia, unspecified: Secondary | ICD-10-CM | POA: Diagnosis not present

## 2014-07-02 DIAGNOSIS — I2510 Atherosclerotic heart disease of native coronary artery without angina pectoris: Secondary | ICD-10-CM | POA: Diagnosis not present

## 2014-07-02 DIAGNOSIS — I10 Essential (primary) hypertension: Secondary | ICD-10-CM | POA: Diagnosis not present

## 2014-07-20 DIAGNOSIS — R079 Chest pain, unspecified: Secondary | ICD-10-CM | POA: Diagnosis not present

## 2014-07-20 DIAGNOSIS — I340 Nonrheumatic mitral (valve) insufficiency: Secondary | ICD-10-CM | POA: Diagnosis not present

## 2014-07-20 DIAGNOSIS — I10 Essential (primary) hypertension: Secondary | ICD-10-CM | POA: Diagnosis not present

## 2014-07-20 DIAGNOSIS — E785 Hyperlipidemia, unspecified: Secondary | ICD-10-CM | POA: Diagnosis not present

## 2014-07-20 DIAGNOSIS — I2510 Atherosclerotic heart disease of native coronary artery without angina pectoris: Secondary | ICD-10-CM | POA: Diagnosis not present

## 2014-07-20 LAB — LIPID PANEL
Cholesterol: 113 (ref 0–200)
HDL: 34 — AB (ref 35–70)
LDL Cholesterol: 63
LDl/HDL Ratio: 3.3
Triglycerides: 88 (ref 40–160)

## 2014-08-19 DIAGNOSIS — I10 Essential (primary) hypertension: Secondary | ICD-10-CM | POA: Diagnosis not present

## 2014-08-19 DIAGNOSIS — E1165 Type 2 diabetes mellitus with hyperglycemia: Secondary | ICD-10-CM | POA: Diagnosis not present

## 2014-08-31 DIAGNOSIS — I2510 Atherosclerotic heart disease of native coronary artery without angina pectoris: Secondary | ICD-10-CM | POA: Diagnosis not present

## 2014-08-31 DIAGNOSIS — E785 Hyperlipidemia, unspecified: Secondary | ICD-10-CM | POA: Diagnosis not present

## 2014-08-31 DIAGNOSIS — I10 Essential (primary) hypertension: Secondary | ICD-10-CM | POA: Diagnosis not present

## 2014-08-31 DIAGNOSIS — I340 Nonrheumatic mitral (valve) insufficiency: Secondary | ICD-10-CM | POA: Diagnosis not present

## 2014-09-02 DIAGNOSIS — E1165 Type 2 diabetes mellitus with hyperglycemia: Secondary | ICD-10-CM | POA: Diagnosis not present

## 2014-09-11 ENCOUNTER — Encounter: Payer: Self-pay | Admitting: Family Medicine

## 2014-09-23 DIAGNOSIS — E1165 Type 2 diabetes mellitus with hyperglycemia: Secondary | ICD-10-CM | POA: Diagnosis not present

## 2014-10-12 DIAGNOSIS — E785 Hyperlipidemia, unspecified: Secondary | ICD-10-CM | POA: Diagnosis not present

## 2014-10-12 DIAGNOSIS — I2510 Atherosclerotic heart disease of native coronary artery without angina pectoris: Secondary | ICD-10-CM | POA: Diagnosis not present

## 2014-10-12 DIAGNOSIS — I10 Essential (primary) hypertension: Secondary | ICD-10-CM | POA: Diagnosis not present

## 2014-10-12 DIAGNOSIS — R55 Syncope and collapse: Secondary | ICD-10-CM | POA: Diagnosis not present

## 2014-10-16 DIAGNOSIS — I2510 Atherosclerotic heart disease of native coronary artery without angina pectoris: Secondary | ICD-10-CM | POA: Diagnosis not present

## 2014-10-16 DIAGNOSIS — E785 Hyperlipidemia, unspecified: Secondary | ICD-10-CM | POA: Diagnosis not present

## 2014-10-16 DIAGNOSIS — I10 Essential (primary) hypertension: Secondary | ICD-10-CM | POA: Diagnosis not present

## 2014-10-16 DIAGNOSIS — R079 Chest pain, unspecified: Secondary | ICD-10-CM | POA: Diagnosis not present

## 2014-10-16 DIAGNOSIS — I340 Nonrheumatic mitral (valve) insufficiency: Secondary | ICD-10-CM | POA: Diagnosis not present

## 2014-10-16 DIAGNOSIS — R55 Syncope and collapse: Secondary | ICD-10-CM | POA: Diagnosis not present

## 2014-10-19 DIAGNOSIS — E785 Hyperlipidemia, unspecified: Secondary | ICD-10-CM | POA: Diagnosis not present

## 2014-10-19 DIAGNOSIS — I10 Essential (primary) hypertension: Secondary | ICD-10-CM | POA: Diagnosis not present

## 2014-10-19 DIAGNOSIS — I340 Nonrheumatic mitral (valve) insufficiency: Secondary | ICD-10-CM | POA: Diagnosis not present

## 2014-10-19 DIAGNOSIS — R079 Chest pain, unspecified: Secondary | ICD-10-CM | POA: Diagnosis not present

## 2014-10-19 DIAGNOSIS — R55 Syncope and collapse: Secondary | ICD-10-CM | POA: Diagnosis not present

## 2014-10-19 DIAGNOSIS — I2510 Atherosclerotic heart disease of native coronary artery without angina pectoris: Secondary | ICD-10-CM | POA: Diagnosis not present

## 2014-10-22 DIAGNOSIS — R9430 Abnormal result of cardiovascular function study, unspecified: Secondary | ICD-10-CM | POA: Diagnosis not present

## 2014-10-27 DIAGNOSIS — I10 Essential (primary) hypertension: Secondary | ICD-10-CM | POA: Diagnosis not present

## 2014-10-27 DIAGNOSIS — I340 Nonrheumatic mitral (valve) insufficiency: Secondary | ICD-10-CM | POA: Diagnosis not present

## 2014-10-27 DIAGNOSIS — I2510 Atherosclerotic heart disease of native coronary artery without angina pectoris: Secondary | ICD-10-CM | POA: Diagnosis not present

## 2014-10-27 DIAGNOSIS — E785 Hyperlipidemia, unspecified: Secondary | ICD-10-CM | POA: Diagnosis not present

## 2014-11-24 DIAGNOSIS — Z23 Encounter for immunization: Secondary | ICD-10-CM | POA: Diagnosis not present

## 2014-11-24 DIAGNOSIS — E1165 Type 2 diabetes mellitus with hyperglycemia: Secondary | ICD-10-CM | POA: Diagnosis not present

## 2014-12-28 DIAGNOSIS — E1142 Type 2 diabetes mellitus with diabetic polyneuropathy: Secondary | ICD-10-CM | POA: Diagnosis not present

## 2014-12-28 DIAGNOSIS — E1165 Type 2 diabetes mellitus with hyperglycemia: Secondary | ICD-10-CM | POA: Diagnosis not present

## 2014-12-28 DIAGNOSIS — Z6825 Body mass index (BMI) 25.0-25.9, adult: Secondary | ICD-10-CM | POA: Diagnosis not present

## 2015-01-27 DIAGNOSIS — Z6826 Body mass index (BMI) 26.0-26.9, adult: Secondary | ICD-10-CM | POA: Diagnosis not present

## 2015-01-27 DIAGNOSIS — E118 Type 2 diabetes mellitus with unspecified complications: Secondary | ICD-10-CM | POA: Diagnosis not present

## 2015-01-27 DIAGNOSIS — I10 Essential (primary) hypertension: Secondary | ICD-10-CM | POA: Diagnosis not present

## 2015-03-01 DIAGNOSIS — E785 Hyperlipidemia, unspecified: Secondary | ICD-10-CM | POA: Diagnosis not present

## 2015-03-01 DIAGNOSIS — I10 Essential (primary) hypertension: Secondary | ICD-10-CM | POA: Diagnosis not present

## 2015-03-01 DIAGNOSIS — I2510 Atherosclerotic heart disease of native coronary artery without angina pectoris: Secondary | ICD-10-CM | POA: Diagnosis not present

## 2015-03-09 ENCOUNTER — Emergency Department
Admission: EM | Admit: 2015-03-09 | Discharge: 2015-03-09 | Disposition: A | Discharge status: Home / Self Care | Payer: Commercial Managed Care - HMO | Attending: Emergency Medicine | Admitting: Emergency Medicine

## 2015-03-09 ENCOUNTER — Encounter: Payer: Self-pay | Admitting: Emergency Medicine

## 2015-03-09 ENCOUNTER — Emergency Department: Payer: Commercial Managed Care - HMO

## 2015-03-09 DIAGNOSIS — Z794 Long term (current) use of insulin: Secondary | ICD-10-CM | POA: Insufficient documentation

## 2015-03-09 DIAGNOSIS — E119 Type 2 diabetes mellitus without complications: Secondary | ICD-10-CM | POA: Insufficient documentation

## 2015-03-09 DIAGNOSIS — Z7982 Long term (current) use of aspirin: Secondary | ICD-10-CM | POA: Diagnosis not present

## 2015-03-09 DIAGNOSIS — I10 Essential (primary) hypertension: Secondary | ICD-10-CM | POA: Insufficient documentation

## 2015-03-09 DIAGNOSIS — Z87891 Personal history of nicotine dependence: Secondary | ICD-10-CM | POA: Diagnosis not present

## 2015-03-09 DIAGNOSIS — Z79899 Other long term (current) drug therapy: Secondary | ICD-10-CM | POA: Diagnosis not present

## 2015-03-09 DIAGNOSIS — Z7984 Long term (current) use of oral hypoglycemic drugs: Secondary | ICD-10-CM | POA: Diagnosis not present

## 2015-03-09 DIAGNOSIS — R55 Syncope and collapse: Secondary | ICD-10-CM | POA: Diagnosis not present

## 2015-03-09 DIAGNOSIS — R404 Transient alteration of awareness: Secondary | ICD-10-CM | POA: Diagnosis not present

## 2015-03-09 LAB — COMPREHENSIVE METABOLIC PANEL
ALBUMIN: 3.8 g/dL (ref 3.5–5.0)
ALT: 10 U/L — AB (ref 17–63)
ANION GAP: 10 (ref 5–15)
AST: 20 U/L (ref 15–41)
Alkaline Phosphatase: 81 U/L (ref 38–126)
BUN: 15 mg/dL (ref 6–20)
CHLORIDE: 108 mmol/L (ref 101–111)
CO2: 22 mmol/L (ref 22–32)
Calcium: 9.1 mg/dL (ref 8.9–10.3)
Creatinine, Ser: 0.93 mg/dL (ref 0.61–1.24)
Glucose, Bld: 247 mg/dL — ABNORMAL HIGH (ref 65–99)
POTASSIUM: 4 mmol/L (ref 3.5–5.1)
Sodium: 140 mmol/L (ref 135–145)
Total Bilirubin: 1.1 mg/dL (ref 0.3–1.2)
Total Protein: 6.6 g/dL (ref 6.5–8.1)

## 2015-03-09 LAB — CBC WITH DIFFERENTIAL/PLATELET
BASOS ABS: 0 10*3/uL (ref 0–0.1)
Eosinophils Absolute: 0.1 10*3/uL (ref 0–0.7)
Eosinophils Relative: 1 %
HEMATOCRIT: 37.1 % — AB (ref 40.0–52.0)
Hemoglobin: 12.1 g/dL — ABNORMAL LOW (ref 13.0–18.0)
Lymphs Abs: 1.5 10*3/uL (ref 1.0–3.6)
MCH: 28.9 pg (ref 26.0–34.0)
MCHC: 32.5 g/dL (ref 32.0–36.0)
MCV: 89.1 fL (ref 80.0–100.0)
Monocytes Absolute: 0.8 10*3/uL (ref 0.2–1.0)
Monocytes Relative: 8 %
NEUTROS ABS: 8.2 10*3/uL — AB (ref 1.4–6.5)
Neutrophils Relative %: 76 %
PLATELETS: 208 10*3/uL (ref 150–440)
RBC: 4.17 MIL/uL — AB (ref 4.40–5.90)
RDW: 15.1 % — AB (ref 11.5–14.5)
WBC: 10.7 10*3/uL — AB (ref 3.8–10.6)

## 2015-03-09 LAB — BRAIN NATRIURETIC PEPTIDE: B NATRIURETIC PEPTIDE 5: 81 pg/mL (ref 0.0–100.0)

## 2015-03-09 LAB — TROPONIN I: Troponin I: <0.03 ng/mL (ref <0.031)

## 2015-03-09 NOTE — Discharge Instructions (Signed)
Syncope °Syncope is a medical term for fainting or passing out. This means you lose consciousness and drop to the ground. People are generally unconscious for less than 5 minutes. You may have some muscle twitches for up to 15 seconds before waking up and returning to normal. Syncope occurs more often in older adults, but it can happen to anyone. While most causes of syncope are not dangerous, syncope can be a sign of a serious medical problem. It is important to seek medical care.  °CAUSES  °Syncope is caused by a sudden drop in blood flow to the brain. The specific cause is often not determined. Factors that can bring on syncope include: °· Taking medicines that lower blood pressure. °· Sudden changes in posture, such as standing up quickly. °· Taking more medicine than prescribed. °· Standing in one place for too long. °· Seizure disorders. °· Dehydration and excessive exposure to heat. °· Low blood sugar (hypoglycemia). °· Straining to have a bowel movement. °· Heart disease, irregular heartbeat, or other circulatory problems. °· Fear, emotional distress, seeing blood, or severe pain. °SYMPTOMS  °Right before fainting, you may: °· Feel dizzy or light-headed. °· Feel nauseous. °· See all white or all black in your field of vision. °· Have cold, clammy skin. °DIAGNOSIS  °Your health care provider will ask about your symptoms, perform a physical exam, and perform an electrocardiogram (ECG) to record the electrical activity of your heart. Your health care provider may also perform other heart or blood tests to determine the cause of your syncope which may include: °· Transthoracic echocardiogram (TTE). During echocardiography, sound waves are used to evaluate how blood flows through your heart. °· Transesophageal echocardiogram (TEE). °· Cardiac monitoring. This allows your health care provider to monitor your heart rate and rhythm in real time. °· Holter monitor. This is a portable device that records your  heartbeat and can help diagnose heart arrhythmias. It allows your health care provider to track your heart activity for several days, if needed. °· Stress tests by exercise or by giving medicine that makes the heart beat faster. °TREATMENT  °In most cases, no treatment is needed. Depending on the cause of your syncope, your health care provider may recommend changing or stopping some of your medicines. °HOME CARE INSTRUCTIONS °· Have someone stay with you until you feel stable. °· Do not drive, use machinery, or play sports until your health care provider says it is okay. °· Keep all follow-up appointments as directed by your health care provider. °· Lie down right away if you start feeling like you might faint. Breathe deeply and steadily. Wait until all the symptoms have passed. °· Drink enough fluids to keep your urine clear or pale yellow. °· If you are taking blood pressure or heart medicine, get up slowly and take several minutes to sit and then stand. This can reduce dizziness. °SEEK IMMEDIATE MEDICAL CARE IF:  °· You have a severe headache. °· You have unusual pain in the chest, abdomen, or back. °· You are bleeding from your mouth or rectum, or you have black or tarry stool. °· You have an irregular or very fast heartbeat. °· You have pain with breathing. °· You have repeated fainting or seizure-like jerking during an episode. °· You faint when sitting or lying down. °· You have confusion. °· You have trouble walking. °· You have severe weakness. °· You have vision problems. °If you fainted, call your local emergency services (911 in [place] Do not drive   yourself to the hospital.    This information is not intended to replace advice given to you by your health care provider. Make sure you discuss any questions you have with your health care provider.   Document Released: 12/26/2004 Document Revised: 05/12/2014 Document Reviewed: 02/24/2011 Elsevier Interactive Patient Education [employer].  I  spoke to Dr. Neoma Laming. Please go see him in the office at 10:00 tomorrow. Please return here sooner if you have any further problems.

## 2015-03-09 NOTE — ED Notes (Signed)
Patient presents to the ED via [facility].  Patient reports sitting in a chair on his mother-in-law's porch and per witnesses, patient lost consciousness, eyes rolled back, and patient vomited.  Per EMS patient vomited a second time with them.  Patient was alert by the time EMS got to him.  Patent has a history of diabetes and heart disease.  Patient was very diaphoretic during this episode.  Skin is cool and dry now.

## 2015-03-09 NOTE — ED Notes (Signed)
MD at bedside.  Dr. Cinda Quest assessing patient at this time.

## 2015-03-09 NOTE — ED Provider Notes (Addendum)
[facility] Emergency Department Provider Note  ____________________________________________  Time seen: Approximately 1:56 PM  I have reviewed the triage vital signs and the nursing notes.   HISTORY  Chief Complaint Loss of Consciousness    HPI Dylan Mosey. is a 75 y.o. male patient sees Dr. Neoma Laming for known history of heart disease. Family reports the doctor, was given a stent him but couldn't get the stent in. Patient today was sitting at his mother-in-law's chair on her porch watching people and became very sweaty vomited and passed out. Family reports she wasn't really completely unconscious just very groggy. It lasted about 5 minutes and then he came to again remained very sweaty vomited again EMS came to get him and on arrival here he was no longer sweaty at any shortness of breath he denied any chest heaviness tightness or other discomfort. Nothing he does seems to have made the problem, on or get better or worse.   Past Medical History  Diagnosis Date  . Diabetes mellitus   . Hypothyroidism   . Hypercholesterolemia   . Coronary artery disease     hyperlipidemia  . Hypertension   . Shortness of breath dyspnea     with exertion  . Vasovagal syncope   . Diabetes mellitus without complication [facility]     Patient Active Problem List   Diagnosis Date Noted  . Bradycardia on ECG 07/31/2011  . DIABETES MELLITUS, UNCONTROLLED, WITH COMPLICATIONS 0000000  . HYPOTHYROIDISM 03/29/2006  . HYPERLIPIDEMIA 01/13/2006  . PERIPHERAL NEUROPATHY 01/13/2006  . HYPERTENSION 01/13/2006  . PERIPHERAL VASCULAR DISEASE 01/13/2006    Past Surgical History  Procedure Laterality Date  . Thyroid surgery    . Circumcision, non-newborn    . Hernia repair    . Appendectomy    . Cardiac catheterization N/A 06/16/2014    Procedure: Left Heart Cath;  Surgeon: Dionisio David, MD;  Location: [facility];  Service: Cardiovascular;  Laterality: N/A;     Current Outpatient Rx  Name  Route  Sig  Dispense  Refill  . amLODipine (NORVASC) 10 MG tablet   Oral   Take 1 tablet (10 mg total) by mouth daily.   90 tablet   1   . aspirin EC 81 MG tablet   Oral   Take 81 mg by mouth daily.          [name] atorvastatin (LIPITOR) 80 MG tablet   Oral   Take 80 mg by mouth at bedtime.          . benazepril (LOTENSIN) 20 MG tablet   Oral   Take 2 tablets (40 mg total) by mouth daily.   180 tablet   1   . gabapentin (NEURONTIN) 100 MG capsule   Oral   Take 200 mg by mouth at bedtime.         . insulin aspart (NOVOLOG) 100 UNIT/ML injection   Subcutaneous   Inject 5 Units into the skin 3 (three) times daily before meals.          . insulin detemir (LEVEMIR) 100 UNIT/ML injection   Subcutaneous   Inject 20 Units into the skin at bedtime.          . isosorbide mononitrate (IMDUR) 30 MG 24 hr tablet   Oral   Take 30 mg by mouth daily.         [name] levothyroxine (SYNTHROID, LEVOTHROID) 88 MCG tablet   Oral   Take 1 tablet (88  mcg total) by mouth daily.   90 tablet   1   . metFORMIN (GLUCOPHAGE) 1000 MG tablet   Oral   Take 1 tablet (1,000 mg total) by mouth 2 (two) times daily.   180 tablet   1   . metoprolol succinate (TOPROL-XL) 25 MG 24 hr tablet   Oral   Take 25 mg by mouth daily.         . tamsulosin (FLOMAX) 0.4 MG CAPS capsule   Oral   Take 0.4 mg by mouth at bedtime.         . ticagrelor (BRILINTA) 90 MG TABS tablet   Oral   Take 90 mg by mouth 2 (two) times daily.           Allergies Plavix  Family History  Problem Relation Age of Onset  . Diabetes Father   . Hypertension Father   . Cancer Mother   . Diabetes Brother     Social History Social History  Substance Use Topics  . Smoking status: Former Research scientist (life sciences)  . Smokeless tobacco: None  . Alcohol Use: No    Review of Systems Constitutional: No fever/chills Eyes: No visual changes. ENT: No sore throat. Cardiovascular: Denies chest  pain. Respiratory: Denies shortness of breath. Gastrointestinal: No abdominal pain.  No diarrhea.  No constipation. Genitourinary: Negative for dysuria. Musculoskeletal: Negative for back pain. Skin: Negative for rash. Neurological: Negative for headaches, focal weakness or numbness.  10-point ROS otherwise negative.  ____________________________________________   PHYSICAL EXAM:  VITAL SIGNS: ED Triage Vitals  Enc Vitals Group     BP 03/09/15 1353 159/75 mmHg     Pulse Rate 03/09/15 1353 69     Resp 03/09/15 1353 20     Temp 03/09/15 1353 97.6 F (36.4 C)     Temp Source 03/09/15 1353 Oral     SpO2 03/09/15 1353 97 %     Weight 03/09/15 1353 213 lb (96.616 kg)     Height 03/09/15 1353 6\' 6"  (1.981 m)     Head Cir --      Peak Flow --      Pain Score --      Pain Loc --      Pain Edu? --      Excl. in [place]? --     Constitutional: Alert and oriented. Well appearing and in no acute distress. Eyes: Conjunctivae are normal. PERRL. EOMI. Head: Atraumatic. Nose: No congestion/rhinnorhea. Mouth/Throat: Mucous membranes are moist.  Oropharynx non-erythematous. Neck: No stridor. Cardiovascular: Normal rate, regular rhythm. Grossly normal heart sounds.  Good peripheral circulation. Respiratory: Normal respiratory effort.  No retractions. Lungs CTAB. Gastrointestinal: Soft and nontender. No distention. No abdominal bruits. No CVA tenderness. Musculoskeletal: No lower extremity tenderness nor edema.  No joint effusions. Neurologic:  Normal speech and language. No gross focal neurologic deficits are appreciated. No gait instability. Skin:  Skin is warm, dry and intact. No rash noted. Psychiatric: Mood and affect are normal. Speech and behavior are normal.  ____________________________________________   LABS (all labs ordered are listed, but only abnormal results are displayed)  Labs Reviewed  COMPREHENSIVE METABOLIC PANEL - Abnormal; Notable for the following:    Glucose, Bld  247 (*)    ALT 10 (*)    All other components within normal limits  CBC WITH DIFFERENTIAL/PLATELET - Abnormal; Notable for the following:    WBC 10.7 (*)    RBC 4.17 (*)    Hemoglobin 12.1 (*)    HCT 37.1 (*)  RDW 15.1 (*)    Neutro Abs 8.2 (*)    All other components within normal limits  BRAIN NATRIURETIC PEPTIDE  TROPONIN I  TROPONIN I   ____________________________________________  EKG  EKG read and interpreted by me shows normal sinus rhythm rate of 72 left axis no acute ST-T wave changes ____________________________________________  RADIOLOGY  Chest x-ray read by radiology for the third time again is negative ____________________________________________   PROCEDURES    ____________________________________________   INITIAL IMPRESSION / ASSESSMENT AND PLAN / ED COURSE  Pertinent labs & imaging results that were available during my care of the patient were reviewed by me and considered in my medical decision making (see chart for details).  I discussed the patient in detail with Dr. Neoma Laming. He knows the patient very well. He feels the patient will be okay if the troponin is negative., we will get a second troponin. He will see the patient tomorrow at 10:00 in the office if the troponin is negative. ____________________________________________   FINAL CLINICAL IMPRESSION(S) / ED DIAGNOSES  Final diagnoses:  Syncope, unspecified syncope type      Nena Polio, MD 03/09/15 1736 Patient signed out to Dr. Tamsen Snider, MD 03/09/15 [phone]

## 2015-03-10 DIAGNOSIS — R55 Syncope and collapse: Secondary | ICD-10-CM | POA: Diagnosis not present

## 2015-03-10 DIAGNOSIS — I10 Essential (primary) hypertension: Secondary | ICD-10-CM | POA: Diagnosis not present

## 2015-03-10 DIAGNOSIS — I2510 Atherosclerotic heart disease of native coronary artery without angina pectoris: Secondary | ICD-10-CM | POA: Diagnosis not present

## 2015-03-18 DIAGNOSIS — R55 Syncope and collapse: Secondary | ICD-10-CM | POA: Diagnosis not present

## 2015-03-18 DIAGNOSIS — I10 Essential (primary) hypertension: Secondary | ICD-10-CM | POA: Diagnosis not present

## 2015-03-18 DIAGNOSIS — I2510 Atherosclerotic heart disease of native coronary artery without angina pectoris: Secondary | ICD-10-CM | POA: Diagnosis not present

## 2015-03-29 DIAGNOSIS — Z6825 Body mass index (BMI) 25.0-25.9, adult: Secondary | ICD-10-CM | POA: Diagnosis not present

## 2015-03-29 DIAGNOSIS — I10 Essential (primary) hypertension: Secondary | ICD-10-CM | POA: Diagnosis not present

## 2015-03-29 DIAGNOSIS — J22 Unspecified acute lower respiratory infection: Secondary | ICD-10-CM | POA: Diagnosis not present

## 2015-03-29 DIAGNOSIS — E1165 Type 2 diabetes mellitus with hyperglycemia: Secondary | ICD-10-CM | POA: Diagnosis not present

## 2015-04-28 ENCOUNTER — Emergency Department (HOSPITAL_COMMUNITY)
Admission: EM | Admit: 2015-04-28 | Discharge: 2015-04-28 | Disposition: A | Discharge status: Home / Self Care | Payer: Commercial Managed Care - HMO | Attending: Emergency Medicine | Admitting: Emergency Medicine

## 2015-04-28 ENCOUNTER — Encounter (HOSPITAL_COMMUNITY): Payer: Self-pay | Admitting: *Deleted

## 2015-04-28 DIAGNOSIS — Z7984 Long term (current) use of oral hypoglycemic drugs: Secondary | ICD-10-CM | POA: Insufficient documentation

## 2015-04-28 DIAGNOSIS — E039 Hypothyroidism, unspecified: Secondary | ICD-10-CM | POA: Insufficient documentation

## 2015-04-28 DIAGNOSIS — Z79899 Other long term (current) drug therapy: Secondary | ICD-10-CM | POA: Diagnosis not present

## 2015-04-28 DIAGNOSIS — E119 Type 2 diabetes mellitus without complications: Secondary | ICD-10-CM | POA: Diagnosis not present

## 2015-04-28 DIAGNOSIS — E785 Hyperlipidemia, unspecified: Secondary | ICD-10-CM | POA: Diagnosis not present

## 2015-04-28 DIAGNOSIS — Z794 Long term (current) use of insulin: Secondary | ICD-10-CM | POA: Diagnosis not present

## 2015-04-28 DIAGNOSIS — Z7982 Long term (current) use of aspirin: Secondary | ICD-10-CM | POA: Insufficient documentation

## 2015-04-28 DIAGNOSIS — R42 Dizziness and giddiness: Secondary | ICD-10-CM | POA: Diagnosis not present

## 2015-04-28 DIAGNOSIS — Z87891 Personal history of nicotine dependence: Secondary | ICD-10-CM | POA: Insufficient documentation

## 2015-04-28 LAB — URINALYSIS, ROUTINE W REFLEX MICROSCOPIC
BILIRUBIN URINE: NEGATIVE
GLUCOSE, UA: 100 mg/dL — AB
HGB URINE DIPSTICK: NEGATIVE
Ketones, ur: NEGATIVE mg/dL
Leukocytes, UA: NEGATIVE
Nitrite: NEGATIVE
Protein, ur: NEGATIVE mg/dL
SPECIFIC GRAVITY, URINE: 1.015 (ref 1.005–1.030)
pH: 7 (ref 5.0–8.0)

## 2015-04-28 LAB — BASIC METABOLIC PANEL
Anion gap: 12 (ref 5–15)
BUN: 20 mg/dL (ref 6–20)
CALCIUM: 9.8 mg/dL (ref 8.9–10.3)
CHLORIDE: 103 mmol/L (ref 101–111)
CO2: 25 mmol/L (ref 22–32)
CREATININE: 1.01 mg/dL (ref 0.61–1.24)
GFR calc non Af Amer: >60 mL/min (ref >60)
GLUCOSE: 183 mg/dL — AB (ref 65–99)
Potassium: 4.1 mmol/L (ref 3.5–5.1)
Sodium: 140 mmol/L (ref 135–145)

## 2015-04-28 LAB — TROPONIN I

## 2015-04-28 LAB — CBG MONITORING, ED: Glucose-Capillary: 160 mg/dL — ABNORMAL HIGH (ref 65–99)

## 2015-04-28 LAB — CBC
HCT: 36.5 % — ABNORMAL LOW (ref 39.0–52.0)
HEMOGLOBIN: 12.1 g/dL — AB (ref 13.0–17.0)
MCH: 29.5 pg (ref 26.0–34.0)
MCHC: 33.2 g/dL (ref 30.0–36.0)
MCV: 89 fL (ref 78.0–100.0)
PLATELETS: 210 10*3/uL (ref 150–400)
RBC: 4.1 MIL/uL — AB (ref 4.22–5.81)
RDW: 14.3 % (ref 11.5–15.5)
WBC: 6.5 10*3/uL (ref 4.0–10.5)

## 2015-04-28 NOTE — ED Provider Notes (Addendum)
TIME SEEN: 4:10 AM  CHIEF COMPLAINT: Lightheadedness  HPI: Pt is a 75 y.o. male with history of coronary artery disease, hypertension, hyperlipidemia, diabetes who presents to the emergency department with an episode of lightheadedness that occurred when he got up around 1 AM and walk to go to the bathroom. He states that he felt "swimmy headed" and that his head felt "heavy". States he felt like he was wobbly. Reports that all of his symptoms are now gone. Denies any vertigo. No headache. No chest pain or chest discomfort. No shortness of breath. No vomiting, diarrhea, bloody stools, melena. No numbness, tingling or focal weakness.  Denies a syncopal event.  ROS: See HPI Constitutional: no fever  Eyes: no drainage  ENT: no runny nose   Cardiovascular:  no chest pain  Resp: no SOB  GI: no vomiting GU: no dysuria Integumentary: no rash  Allergy: no hives  Musculoskeletal: no leg swelling  Neurological: no slurred speech ROS otherwise negative  PAST MEDICAL HISTORY/PAST SURGICAL HISTORY:  Past Medical History  Diagnosis Date  . Diabetes mellitus   . Hypothyroidism   . Hypercholesterolemia   . Coronary artery disease     hyperlipidemia  . Hypertension   . Shortness of breath dyspnea     with exertion  . Vasovagal syncope   . Diabetes mellitus without complication ([place])     MEDICATIONS:  Prior to Admission medications   Medication Sig Start Date End Date Taking? Authorizing Provider  amLODipine (NORVASC) 10 MG tablet Take 1 tablet (10 mg total) by mouth daily. 01/26/12  Yes Alycia Rossetti, MD  atorvastatin (LIPITOR) 80 MG tablet Take 80 mg by mouth at bedtime.    Yes Historical Provider, MD  benazepril (LOTENSIN) 20 MG tablet Take 2 tablets (40 mg total) by mouth daily. 01/26/12  Yes Alycia Rossetti, MD  gabapentin (NEURONTIN) 100 MG capsule Take 200 mg by mouth at bedtime.   Yes Historical Provider, MD  insulin aspart (NOVOLOG) 100 UNIT/ML injection Inject 5 Units into the  skin 3 (three) times daily before meals.    Yes Historical Provider, MD  insulin detemir (LEVEMIR) 100 UNIT/ML injection Inject 22 Units into the skin at bedtime.    Yes Historical Provider, MD  isosorbide mononitrate (IMDUR) 30 MG 24 hr tablet Take 30 mg by mouth daily.   Yes Historical Provider, MD  levothyroxine (SYNTHROID, LEVOTHROID) 88 MCG tablet Take 1 tablet (88 mcg total) by mouth daily. 01/26/12  Yes Alycia Rossetti, MD  metFORMIN (GLUCOPHAGE) 1000 MG tablet Take 1 tablet (1,000 mg total) by mouth 2 (two) times daily. 01/26/12  Yes Alycia Rossetti, MD  metoprolol succinate (TOPROL-XL) 25 MG 24 hr tablet Take 25 mg by mouth daily.   Yes Historical Provider, MD  tamsulosin (FLOMAX) 0.4 MG CAPS capsule Take 0.4 mg by mouth at bedtime.   Yes Historical Provider, MD  ticagrelor (BRILINTA) 90 MG TABS tablet Take 90 mg by mouth 2 (two) times daily.   Yes Historical Provider, MD  aspirin EC 81 MG tablet Take 81 mg by mouth daily.     Historical Provider, MD    ALLERGIES:  Allergies  Allergen Reactions  . Plavix [Clopidogrel] Hives    SOCIAL HISTORY:  Social History  Substance Use Topics  . Smoking status: Former Research scientist (life sciences)  . Smokeless tobacco: Not on file  . Alcohol Use: No    FAMILY HISTORY: Family History  Problem Relation Age of Onset  . Diabetes Father   . Hypertension  Father   . Cancer Mother   . Diabetes Brother     EXAM: BP 165/79 mmHg  Pulse 69  Temp(Src) 98.2 F (36.8 C)  Resp 20  Ht 6\' 6"  (1.981 m)  Wt 213 lb (96.616 kg)  BMI 24.62 kg/m2  SpO2 98% CONSTITUTIONAL: Alert and oriented and responds appropriately to questions. Elderly, chronically ill-appearing, smiling, in no distress, afebrile HEAD: Normocephalic EYES: Conjunctivae clear, PERRL ENT: normal nose; no rhinorrhea; moist mucous membranes, poor dentition with multiple missing teeth but no obvious sign of dental abscess, no Ludwig angina NECK: Supple, no meningismus, no LAD  CARD: RRR; S1 and S2  appreciated; no murmurs, no clicks, no rubs, no gallops RESP: Normal chest excursion without splinting or tachypnea; breath sounds clear and equal bilaterally; no wheezes, no rhonchi, no rales, no hypoxia or respiratory distress, speaking full sentences ABD/GI: Normal bowel sounds; non-distended; soft, non-tender, no rebound, no guarding, no peritoneal signs BACK:  The back appears normal and is non-tender to palpation, there is no CVA tenderness EXT: Normal ROM in all joints; non-tender to palpation; no edema; normal capillary refill; no cyanosis, no calf tenderness or swelling    SKIN: Normal color for age and race; warm; no rash NEURO: Moves all extremities equally, sensation to light touch intact diffusely, cranial nerves II through XII intact, strength 5/5 in all 4 extremities, normal gait, no dysmetria to finger to nose testing bilaterally, normal heel-to-shin testing bilaterally PSYCH: The patient's mood and manner are appropriate. Grooming and personal hygiene are appropriate.  MEDICAL DECISION MAKING: Patient here after he felt "swimmy headed" and had a "heavy head" after standing up from a lying flat position. Now has absolutely no symptoms. EKG shows no ischemic abnormality or arrhythmia. He denies any chest pain or shortness of breath. His blood glucose is normal. Orthostatic vital signs do show that he has increase in heart rate whenever he is standing. Discussed with patient and his wife that I suspect that this was orthostasis causing his symptoms. We will encourage oral fluids and ambulate patient in the emergency department. We'll obtain labs to ensure there is no organic cause such as anemia, electrolyte abnormality, ACS, UTI that could be causing his symptoms.  Doubt stroke. He is neurologically intact. Denies headache currently. I do not feel he needs head imaging at this time.  ED PROGRESS: Labs are unremarkable. Hemoglobin is 12.1, electrolytes normal. Urine shows no ketones or sign  of infection. Troponin is negative. He has been able to even really without any assistance. Reports feeling much better. I feel he is safe for discharge. Have advised him that when he gets up from a lying or sitting position that he should do so slowly. I feel he is safe to go home and patient and wife are comfortable with this plan. He does have a PCP for follow-up.   At this time, I do not feel there is any life-threatening condition present. I have reviewed and discussed all results (EKG, imaging, lab, urine as appropriate), exam findings with patient. I have reviewed nursing notes and appropriate previous records.  I feel the patient is safe to be discharged home without further emergent workup. Discussed usual and customary return precautions. Patient and family (if present) verbalize understanding and are comfortable with this plan.  Patient will follow-up with their primary care provider. If they do not have a primary care provider, information for follow-up has been provided to them. All questions have been answered.      EKG  Interpretation  Date/Time:  Wednesday April 28 2015 03:45:17 EDT Ventricular Rate:  65 PR Interval:  180 QRS Duration: 82 QT Interval:  425 QTC Calculation: 442 R Axis:   13 Text Interpretation:  Sinus rhythm Atrial premature complexes Abnormal R-wave progression, early transition Minimal ST depression, inferior leads No significant change since last tracing Confirmed by Jaeshawn Silvio,  DO, Isam Unrein [id number]) on 04/28/2015 4:16:44 AM          [place], DO 04/28/15 [place], DO 04/28/15 [id number]

## 2015-04-28 NOTE — ED Notes (Signed)
Pt ambulated around nurses station without difficulty; pt denies any dizziness; pt given water and encouraged to drink

## 2015-04-28 NOTE — ED Notes (Signed)
Pt c/o dizziness since 1 am; pt states he got up to go to bathroom when he noticed the dizziness started; pt denies any n/v

## 2015-04-28 NOTE — Discharge Instructions (Signed)
Dizziness Dizziness is a common problem. It is a feeling of unsteadiness or light-headedness. You may feel like you are about to faint. Dizziness can lead to injury if you stumble or fall. Anyone can become dizzy, but dizziness is more common in older adults. This condition can be caused by a number of things, including medicines, dehydration, or illness. HOME CARE INSTRUCTIONS Taking these steps may help with your condition: Eating and Drinking  Drink enough fluid to keep your urine clear or pale yellow. This helps to keep you from becoming dehydrated. Try to drink more clear fluids, such as water.  Do not drink alcohol.  Limit your caffeine intake if directed by your health care provider.  Limit your salt intake if directed by your health care provider. Activity  Avoid making quick movements.  Rise slowly from chairs and steady yourself until you feel okay.  In the morning, first sit up on the side of the bed. When you feel okay, stand slowly while you hold onto something until you know that your balance is fine.  Move your legs often if you need to stand in one place for a long time. Tighten and relax your muscles in your legs while you are standing.  Do not drive or operate heavy machinery if you feel dizzy.  Avoid bending down if you feel dizzy. Place items in your home so that they are easy for you to reach without leaning over. Lifestyle  Do not use any tobacco products, including cigarettes, chewing tobacco, or electronic cigarettes. If you need help quitting, ask your health care provider.  Try to reduce your stress level, such as with yoga or meditation. Talk with your health care provider if you need help. General Instructions  Watch your dizziness for any changes.  Take medicines only as directed by your health care provider. Talk with your health care provider if you think that your dizziness is caused by a medicine that you are taking.  Tell a friend or a family  member that you are feeling dizzy. If he or she notices any changes in your behavior, have this person call your health care provider.  Keep all follow-up visits as directed by your health care provider. This is important. SEEK MEDICAL CARE IF:  Your dizziness does not go away.  Your dizziness or light-headedness gets worse.  You feel nauseous.  You have reduced hearing.  You have new symptoms.  You are unsteady on your feet or you feel like the room is spinning. SEEK IMMEDIATE MEDICAL CARE IF:  You vomit or have diarrhea and are unable to eat or drink anything.  You have problems talking, walking, swallowing, or using your arms, hands, or legs.  You feel generally weak.  You are not thinking clearly or you have trouble forming sentences. It may take a friend or family member to notice this.  You have chest pain, abdominal pain, shortness of breath, or sweating.  Your vision changes.  You notice any bleeding.  You have a headache.  You have neck pain or a stiff neck.  You have a fever.   This information is not intended to replace advice given to you by your health care provider. Make sure you discuss any questions you have with your health care provider.   Document Released: 06/21/2000 Document Revised: 05/12/2014 Document Reviewed: 12/22/2013 Elsevier Interactive Patient Education 2016 Elsevier Inc[employer]   Orthostatic Hypotension Orthostatic hypotension is a sudden drop in blood pressure. It happens when you quickly stand  up from a seated or lying position. You may feel dizzy or light-headed. This can last for just a few seconds or for up to a few minutes. It is usually not a serious problem. However, if this happens frequently or gets worse, it can be a sign of something more serious. CAUSES  Different things can cause orthostatic hypotension, including:   Loss of body fluids (dehydration).  Medicines that lower blood pressure.  Sudden changes in posture, such as  standing up quickly after you have been sitting or lying down.  Taking too much of your medicine. SIGNS AND SYMPTOMS   Light-headedness or dizziness.   Fainting or near-fainting.   A fast heart rate.   Weakness.   Feeling tired (fatigue).  DIAGNOSIS  Your health care provider may do several things to help diagnose your condition and identify the cause. These may include:   Taking a medical history and doing a physical exam.  Checking your blood pressure. Your health care provider will check your blood pressure when you are:  Lying down.  Sitting.  Standing.  Using tilt table testing. In this test, you lie down on a table that moves from a lying position to a standing position. You will be strapped onto the table. This test monitors your blood pressure and heart rate when you are in different positions. TREATMENT  Treatment will vary depending on the cause. Possible treatments include:   Changing the dosage of your medicines.  Wearing compression stockings on your lower legs.  Standing up slowly after sitting or lying down.  Eating more salt.  Eating frequent, small meals.  In some cases, getting IV fluids.  Taking medicine to enhance fluid retention. HOME CARE INSTRUCTIONS  Only take over-the-counter or prescription medicines as directed by your health care provider.  Follow your health care provider's instructions for changing the dosage of your current medicines.  Do not stop or adjust your medicine on your own.  Stand up slowly after sitting or lying down. This allows your body to adjust to the different position.  Wear compression stockings as directed.  Eat extra salt as directed.  Do not add extra salt to your diet unless directed to by your health care provider.  Eat frequent, small meals.  Avoid standing suddenly after eating.  Avoid hot showers or excessive heat as directed by your health care provider.  Keep all follow-up  appointments. SEEK MEDICAL CARE IF:  You continue to feel dizzy or light-headed after standing.  You feel groggy or confused.  You feel cold, clammy, or sick to your stomach (nauseous).  You have blurred vision.  You feel short of breath. SEEK IMMEDIATE MEDICAL CARE IF:   You faint after standing.  You have chest pain.  You have difficulty breathing.   You lose feeling or movement in your arms or legs.   You have slurred speech or difficulty talking, or you are unable to talk.  MAKE SURE YOU:   Understand these instructions.  Will watch your condition.  Will get help right away if you are not doing well or get worse.   This information is not intended to replace advice given to you by your health care provider. Make sure you discuss any questions you have with your health care provider.   Document Released: 12/16/2001 Document Revised: 12/31/2012 Document Reviewed: 10/18/2012 Elsevier Interactive Patient Education [employer].

## 2015-05-03 DIAGNOSIS — Z6826 Body mass index (BMI) 26.0-26.9, adult: Secondary | ICD-10-CM | POA: Diagnosis not present

## 2015-05-03 DIAGNOSIS — E1142 Type 2 diabetes mellitus with diabetic polyneuropathy: Secondary | ICD-10-CM | POA: Diagnosis not present

## 2015-05-03 DIAGNOSIS — I10 Essential (primary) hypertension: Secondary | ICD-10-CM | POA: Diagnosis not present

## 2015-06-02 DIAGNOSIS — E1165 Type 2 diabetes mellitus with hyperglycemia: Secondary | ICD-10-CM | POA: Diagnosis not present

## 2015-08-24 DIAGNOSIS — Z6826 Body mass index (BMI) 26.0-26.9, adult: Secondary | ICD-10-CM | POA: Diagnosis not present

## 2015-08-24 DIAGNOSIS — E1165 Type 2 diabetes mellitus with hyperglycemia: Secondary | ICD-10-CM | POA: Diagnosis not present

## 2015-09-28 DIAGNOSIS — I2510 Atherosclerotic heart disease of native coronary artery without angina pectoris: Secondary | ICD-10-CM | POA: Diagnosis not present

## 2015-09-28 DIAGNOSIS — I10 Essential (primary) hypertension: Secondary | ICD-10-CM | POA: Diagnosis not present

## 2015-09-28 DIAGNOSIS — E785 Hyperlipidemia, unspecified: Secondary | ICD-10-CM | POA: Diagnosis not present

## 2015-11-23 DIAGNOSIS — Z6826 Body mass index (BMI) 26.0-26.9, adult: Secondary | ICD-10-CM | POA: Diagnosis not present

## 2015-11-23 DIAGNOSIS — E1169 Type 2 diabetes mellitus with other specified complication: Secondary | ICD-10-CM | POA: Diagnosis not present

## 2015-11-23 DIAGNOSIS — E1165 Type 2 diabetes mellitus with hyperglycemia: Secondary | ICD-10-CM | POA: Diagnosis not present

## 2015-12-28 DIAGNOSIS — Z0000 Encounter for general adult medical examination without abnormal findings: Secondary | ICD-10-CM | POA: Diagnosis not present

## 2015-12-28 DIAGNOSIS — E118 Type 2 diabetes mellitus with unspecified complications: Secondary | ICD-10-CM | POA: Diagnosis not present

## 2015-12-30 DIAGNOSIS — E785 Hyperlipidemia, unspecified: Secondary | ICD-10-CM | POA: Diagnosis not present

## 2015-12-30 DIAGNOSIS — I2510 Atherosclerotic heart disease of native coronary artery without angina pectoris: Secondary | ICD-10-CM | POA: Diagnosis not present

## 2015-12-30 DIAGNOSIS — I10 Essential (primary) hypertension: Secondary | ICD-10-CM | POA: Diagnosis not present

## 2015-12-30 DIAGNOSIS — R0602 Shortness of breath: Secondary | ICD-10-CM | POA: Diagnosis not present

## 2016-03-07 DIAGNOSIS — E118 Type 2 diabetes mellitus with unspecified complications: Secondary | ICD-10-CM | POA: Diagnosis not present

## 2016-03-30 DIAGNOSIS — E785 Hyperlipidemia, unspecified: Secondary | ICD-10-CM | POA: Diagnosis not present

## 2016-03-30 DIAGNOSIS — I2510 Atherosclerotic heart disease of native coronary artery without angina pectoris: Secondary | ICD-10-CM | POA: Diagnosis not present

## 2016-03-30 DIAGNOSIS — I10 Essential (primary) hypertension: Secondary | ICD-10-CM | POA: Diagnosis not present

## 2016-03-30 DIAGNOSIS — R079 Chest pain, unspecified: Secondary | ICD-10-CM | POA: Diagnosis not present

## 2016-06-06 DIAGNOSIS — E118 Type 2 diabetes mellitus with unspecified complications: Secondary | ICD-10-CM | POA: Diagnosis not present

## 2016-08-01 DIAGNOSIS — I340 Nonrheumatic mitral (valve) insufficiency: Secondary | ICD-10-CM | POA: Diagnosis not present

## 2016-08-01 DIAGNOSIS — I10 Essential (primary) hypertension: Secondary | ICD-10-CM | POA: Diagnosis not present

## 2016-08-01 DIAGNOSIS — I2510 Atherosclerotic heart disease of native coronary artery without angina pectoris: Secondary | ICD-10-CM | POA: Diagnosis not present

## 2016-08-01 DIAGNOSIS — E785 Hyperlipidemia, unspecified: Secondary | ICD-10-CM | POA: Diagnosis not present

## 2016-09-06 DIAGNOSIS — I10 Essential (primary) hypertension: Secondary | ICD-10-CM | POA: Diagnosis not present

## 2016-09-06 DIAGNOSIS — N400 Enlarged prostate without lower urinary tract symptoms: Secondary | ICD-10-CM | POA: Diagnosis not present

## 2016-09-06 DIAGNOSIS — Z125 Encounter for screening for malignant neoplasm of prostate: Secondary | ICD-10-CM | POA: Diagnosis not present

## 2016-09-06 DIAGNOSIS — E118 Type 2 diabetes mellitus with unspecified complications: Secondary | ICD-10-CM | POA: Diagnosis not present

## 2016-10-03 ENCOUNTER — Other Ambulatory Visit (HOSPITAL_COMMUNITY): Payer: Self-pay | Admitting: Family Medicine

## 2016-10-03 ENCOUNTER — Ambulatory Visit (HOSPITAL_COMMUNITY)
Admission: RE | Admit: 2016-10-03 | Discharge: 2016-10-03 | Disposition: A | Discharge status: Home / Self Care | Payer: Medicare HMO | Source: Ambulatory Visit | Attending: Family Medicine | Admitting: Family Medicine

## 2016-10-03 DIAGNOSIS — M545 Low back pain: Secondary | ICD-10-CM | POA: Diagnosis not present

## 2016-10-03 DIAGNOSIS — M5136 Other intervertebral disc degeneration, lumbar region: Secondary | ICD-10-CM | POA: Insufficient documentation

## 2016-10-03 DIAGNOSIS — Z125 Encounter for screening for malignant neoplasm of prostate: Secondary | ICD-10-CM | POA: Diagnosis not present

## 2016-10-03 DIAGNOSIS — I700 Atherosclerosis of aorta: Secondary | ICD-10-CM | POA: Diagnosis not present

## 2016-10-03 DIAGNOSIS — M542 Cervicalgia: Secondary | ICD-10-CM | POA: Diagnosis not present

## 2016-10-03 DIAGNOSIS — M5441 Lumbago with sciatica, right side: Secondary | ICD-10-CM

## 2016-10-03 DIAGNOSIS — E1165 Type 2 diabetes mellitus with hyperglycemia: Secondary | ICD-10-CM | POA: Diagnosis not present

## 2016-10-03 DIAGNOSIS — Z23 Encounter for immunization: Secondary | ICD-10-CM | POA: Diagnosis not present

## 2016-10-03 DIAGNOSIS — M47816 Spondylosis without myelopathy or radiculopathy, lumbar region: Secondary | ICD-10-CM | POA: Diagnosis not present

## 2016-10-03 DIAGNOSIS — E118 Type 2 diabetes mellitus with unspecified complications: Secondary | ICD-10-CM | POA: Diagnosis not present

## 2016-10-03 DIAGNOSIS — I10 Essential (primary) hypertension: Secondary | ICD-10-CM | POA: Diagnosis not present

## 2016-10-03 DIAGNOSIS — E1142 Type 2 diabetes mellitus with diabetic polyneuropathy: Secondary | ICD-10-CM | POA: Diagnosis not present

## 2016-10-03 DIAGNOSIS — E039 Hypothyroidism, unspecified: Secondary | ICD-10-CM | POA: Diagnosis not present

## 2016-10-31 DIAGNOSIS — M479 Spondylosis, unspecified: Secondary | ICD-10-CM | POA: Diagnosis not present

## 2016-10-31 DIAGNOSIS — E118 Type 2 diabetes mellitus with unspecified complications: Secondary | ICD-10-CM | POA: Diagnosis not present

## 2016-11-28 DIAGNOSIS — I10 Essential (primary) hypertension: Secondary | ICD-10-CM | POA: Diagnosis not present

## 2016-12-01 DIAGNOSIS — I2510 Atherosclerotic heart disease of native coronary artery without angina pectoris: Secondary | ICD-10-CM | POA: Diagnosis not present

## 2016-12-01 DIAGNOSIS — E785 Hyperlipidemia, unspecified: Secondary | ICD-10-CM | POA: Diagnosis not present

## 2016-12-01 DIAGNOSIS — I10 Essential (primary) hypertension: Secondary | ICD-10-CM | POA: Diagnosis not present

## 2017-01-23 DIAGNOSIS — E118 Type 2 diabetes mellitus with unspecified complications: Secondary | ICD-10-CM | POA: Diagnosis not present

## 2017-01-23 DIAGNOSIS — E039 Hypothyroidism, unspecified: Secondary | ICD-10-CM | POA: Diagnosis not present

## 2017-01-23 DIAGNOSIS — I10 Essential (primary) hypertension: Secondary | ICD-10-CM | POA: Diagnosis not present

## 2017-04-24 DIAGNOSIS — I10 Essential (primary) hypertension: Secondary | ICD-10-CM | POA: Diagnosis not present

## 2017-04-24 DIAGNOSIS — E039 Hypothyroidism, unspecified: Secondary | ICD-10-CM | POA: Diagnosis not present

## 2017-04-24 DIAGNOSIS — E118 Type 2 diabetes mellitus with unspecified complications: Secondary | ICD-10-CM | POA: Diagnosis not present

## 2017-04-25 DIAGNOSIS — I10 Essential (primary) hypertension: Secondary | ICD-10-CM | POA: Diagnosis not present

## 2017-04-25 DIAGNOSIS — E118 Type 2 diabetes mellitus with unspecified complications: Secondary | ICD-10-CM | POA: Diagnosis not present

## 2017-04-25 DIAGNOSIS — E039 Hypothyroidism, unspecified: Secondary | ICD-10-CM | POA: Diagnosis not present

## 2017-05-01 DIAGNOSIS — I2510 Atherosclerotic heart disease of native coronary artery without angina pectoris: Secondary | ICD-10-CM | POA: Diagnosis not present

## 2017-05-01 DIAGNOSIS — I10 Essential (primary) hypertension: Secondary | ICD-10-CM | POA: Diagnosis not present

## 2017-05-01 DIAGNOSIS — R002 Palpitations: Secondary | ICD-10-CM | POA: Diagnosis not present

## 2017-05-01 DIAGNOSIS — E785 Hyperlipidemia, unspecified: Secondary | ICD-10-CM | POA: Diagnosis not present

## 2017-05-10 DIAGNOSIS — H2513 Age-related nuclear cataract, bilateral: Secondary | ICD-10-CM | POA: Diagnosis not present

## 2017-05-10 DIAGNOSIS — H524 Presbyopia: Secondary | ICD-10-CM | POA: Diagnosis not present

## 2017-05-10 DIAGNOSIS — E119 Type 2 diabetes mellitus without complications: Secondary | ICD-10-CM | POA: Diagnosis not present

## 2017-07-24 DIAGNOSIS — E118 Type 2 diabetes mellitus with unspecified complications: Secondary | ICD-10-CM | POA: Diagnosis not present

## 2017-07-24 DIAGNOSIS — E039 Hypothyroidism, unspecified: Secondary | ICD-10-CM | POA: Diagnosis not present

## 2017-07-24 DIAGNOSIS — Z6825 Body mass index (BMI) 25.0-25.9, adult: Secondary | ICD-10-CM | POA: Diagnosis not present

## 2017-07-24 DIAGNOSIS — E1142 Type 2 diabetes mellitus with diabetic polyneuropathy: Secondary | ICD-10-CM | POA: Diagnosis not present

## 2017-07-24 DIAGNOSIS — Z794 Long term (current) use of insulin: Secondary | ICD-10-CM | POA: Diagnosis not present

## 2017-07-24 DIAGNOSIS — I10 Essential (primary) hypertension: Secondary | ICD-10-CM | POA: Diagnosis not present

## 2017-08-31 DIAGNOSIS — I10 Essential (primary) hypertension: Secondary | ICD-10-CM | POA: Diagnosis not present

## 2017-08-31 DIAGNOSIS — E785 Hyperlipidemia, unspecified: Secondary | ICD-10-CM | POA: Diagnosis not present

## 2017-08-31 DIAGNOSIS — I2510 Atherosclerotic heart disease of native coronary artery without angina pectoris: Secondary | ICD-10-CM | POA: Diagnosis not present

## 2017-09-12 ENCOUNTER — Other Ambulatory Visit: Payer: Self-pay

## 2017-09-12 ENCOUNTER — Emergency Department (HOSPITAL_COMMUNITY): Payer: Medicare HMO

## 2017-09-12 ENCOUNTER — Encounter (HOSPITAL_COMMUNITY): Payer: Self-pay | Admitting: *Deleted

## 2017-09-12 ENCOUNTER — Emergency Department (HOSPITAL_COMMUNITY)
Admission: EM | Admit: 2017-09-12 | Discharge: 2017-09-12 | Disposition: A | Discharge status: Home / Self Care | Payer: Medicare HMO | Attending: Emergency Medicine | Admitting: Emergency Medicine

## 2017-09-12 DIAGNOSIS — I10 Essential (primary) hypertension: Secondary | ICD-10-CM | POA: Diagnosis not present

## 2017-09-12 DIAGNOSIS — Z79899 Other long term (current) drug therapy: Secondary | ICD-10-CM | POA: Diagnosis not present

## 2017-09-12 DIAGNOSIS — E039 Hypothyroidism, unspecified: Secondary | ICD-10-CM | POA: Insufficient documentation

## 2017-09-12 DIAGNOSIS — E785 Hyperlipidemia, unspecified: Secondary | ICD-10-CM | POA: Insufficient documentation

## 2017-09-12 DIAGNOSIS — Z794 Long term (current) use of insulin: Secondary | ICD-10-CM | POA: Diagnosis not present

## 2017-09-12 DIAGNOSIS — E119 Type 2 diabetes mellitus without complications: Secondary | ICD-10-CM | POA: Insufficient documentation

## 2017-09-12 DIAGNOSIS — R1030 Lower abdominal pain, unspecified: Secondary | ICD-10-CM | POA: Diagnosis not present

## 2017-09-12 DIAGNOSIS — R339 Retention of urine, unspecified: Secondary | ICD-10-CM | POA: Diagnosis not present

## 2017-09-12 DIAGNOSIS — Z87891 Personal history of nicotine dependence: Secondary | ICD-10-CM | POA: Insufficient documentation

## 2017-09-12 DIAGNOSIS — I2510 Atherosclerotic heart disease of native coronary artery without angina pectoris: Secondary | ICD-10-CM | POA: Insufficient documentation

## 2017-09-12 DIAGNOSIS — N132 Hydronephrosis with renal and ureteral calculous obstruction: Secondary | ICD-10-CM | POA: Diagnosis not present

## 2017-09-12 DIAGNOSIS — Z7982 Long term (current) use of aspirin: Secondary | ICD-10-CM | POA: Insufficient documentation

## 2017-09-12 DIAGNOSIS — E7800 Pure hypercholesterolemia, unspecified: Secondary | ICD-10-CM | POA: Insufficient documentation

## 2017-09-12 DIAGNOSIS — R300 Dysuria: Secondary | ICD-10-CM | POA: Diagnosis present

## 2017-09-12 LAB — URINALYSIS, ROUTINE W REFLEX MICROSCOPIC
BILIRUBIN URINE: NEGATIVE
Glucose, UA: NEGATIVE mg/dL
Ketones, ur: NEGATIVE mg/dL
Leukocytes, UA: NEGATIVE
Nitrite: NEGATIVE
PH: 6 (ref 5.0–8.0)
Protein, ur: NEGATIVE mg/dL
SPECIFIC GRAVITY, URINE: 1.005 (ref 1.005–1.030)

## 2017-09-12 LAB — BASIC METABOLIC PANEL
Anion gap: 9 (ref 5–15)
BUN: 11 mg/dL (ref 8–23)
CALCIUM: 10.3 mg/dL (ref 8.9–10.3)
CHLORIDE: 103 mmol/L (ref 98–111)
CO2: 29 mmol/L (ref 22–32)
Creatinine, Ser: 1.04 mg/dL (ref 0.61–1.24)
GFR calc Af Amer: >60 mL/min (ref >60)
GFR calc non Af Amer: >60 mL/min (ref >60)
GLUCOSE: 187 mg/dL — AB (ref 70–99)
POTASSIUM: 4.5 mmol/L (ref 3.5–5.1)
Sodium: 141 mmol/L (ref 135–145)

## 2017-09-12 LAB — CBG MONITORING, ED
Glucose-Capillary: 128 mg/dL — ABNORMAL HIGH (ref 70–99)
Glucose-Capillary: 180 mg/dL — ABNORMAL HIGH (ref 70–99)

## 2017-09-12 LAB — CBC
HEMATOCRIT: 43.8 % (ref 39.0–52.0)
HEMOGLOBIN: 14.2 g/dL (ref 13.0–17.0)
MCH: 30.1 pg (ref 26.0–34.0)
MCHC: 32.4 g/dL (ref 30.0–36.0)
MCV: 93 fL (ref 78.0–100.0)
Platelets: 283 10*3/uL (ref 150–400)
RBC: 4.71 MIL/uL (ref 4.22–5.81)
RDW: 14.4 % (ref 11.5–15.5)
WBC: 12.7 10*3/uL — AB (ref 4.0–10.5)

## 2017-09-12 NOTE — Discharge Instructions (Addendum)
Follow-up with urologist to discuss removal of the catheter,

## 2017-09-12 NOTE — ED Provider Notes (Signed)
[facility] EMERGENCY DEPARTMENT Provider Note   CSN: [id number] Arrival date & time: 09/12/17  1118     History   Chief Complaint Chief Complaint  Patient presents with  . Dysuria    HPI Dylan Robles is a 77 y.o. male.  HPI The patient presents to the emergency room for evaluation of dysuria.  Patient dates that around 2 AM this morning he started having pain with urination.  Pain was in his suprapubic region as well as in the penis.  The symptoms have continued throughout the day primarily when he urinates.  He denies having any trouble with any fevers.  He denies any trouble with vomiting or nausea.  He has not had any issues with diarrhea or constipation.  Patient states that the symptoms are mild to moderate in nature. Past Medical History:  Diagnosis Date  . Coronary artery disease    hyperlipidemia  . Diabetes mellitus   . Diabetes mellitus without complication ([place])   . Hypercholesterolemia   . Hypertension   . Hypothyroidism   . Shortness of breath dyspnea    with exertion  . Vasovagal syncope     Patient Active Problem List   Diagnosis Date Noted  . Bradycardia on ECG 07/31/2011  . DIABETES MELLITUS, UNCONTROLLED, WITH COMPLICATIONS 82/42/3536  . HYPOTHYROIDISM 03/29/2006  . HYPERLIPIDEMIA 01/13/2006  . PERIPHERAL NEUROPATHY 01/13/2006  . HYPERTENSION 01/13/2006  . PERIPHERAL VASCULAR DISEASE 01/13/2006    Past Surgical History:  Procedure Laterality Date  . APPENDECTOMY    . CARDIAC CATHETERIZATION N/A 06/16/2014   Procedure: Left Heart Cath;  Surgeon: Dionisio David, MD;  Location: [place] [facility];  Service: Cardiovascular;  Laterality: N/A;  . CIRCUMCISION, NON-NEWBORN    . HERNIA REPAIR    . THYROID SURGERY          Home Medications    Prior to Admission medications   Medication Sig Start Date End Date Taking? Authorizing Provider  amLODipine (NORVASC) 10 MG tablet Take 1 tablet (10 mg total) by mouth daily. 01/26/12  Yes [place], Modena Nunnery, MD  aspirin EC 81 MG tablet Take 81 mg by mouth daily.    Yes [provider]  atorvastatin (LIPITOR) 80 MG tablet Take 80 mg by mouth at bedtime.    Yes [provider]  benazepril (LOTENSIN) 20 MG tablet Take 2 tablets (40 mg total) by mouth daily. 01/26/12  Yes [place], Modena Nunnery, MD  gabapentin (NEURONTIN) 100 MG capsule Take 200 mg by mouth at bedtime.   Yes [provider]  insulin aspart (NOVOLOG) 100 UNIT/ML injection Inject 5 Units into the skin 3 (three) times daily before meals.    Yes [provider]  insulin detemir (LEVEMIR) 100 UNIT/ML injection Inject 22 Units into the skin at bedtime.    Yes [provider]  isosorbide mononitrate (IMDUR) 30 MG 24 hr tablet Take 30 mg by mouth daily.   Yes [provider]  levothyroxine (SYNTHROID, LEVOTHROID) 88 MCG tablet Take 1 tablet (88 mcg total) by mouth daily. 01/26/12  Yes [place], Modena Nunnery, MD  metFORMIN (GLUCOPHAGE) 1000 MG tablet Take 1 tablet (1,000 mg total) by mouth 2 (two) times daily. 01/26/12  Yes , Modena Nunnery, MD  metoprolol succinate (TOPROL-XL) 25 MG 24 hr tablet Take 25 mg by mouth daily.   Yes [provider]  tamsulosin (FLOMAX) 0.4 MG CAPS capsule Take 0.4 mg by mouth at bedtime.   Yes [provider]  ticagrelor Kary Kos)  90 MG TABS tablet Take 90 mg by mouth 2 (two) times daily.   Yes [provider]    Family History Family History  Problem Relation Age of Onset  . Diabetes Father   . Hypertension Father   . Cancer Mother   . Diabetes Brother     Social History Social History   Tobacco Use  . Smoking status: Former Research scientist (life sciences)  . Smokeless tobacco: Never Used  Substance Use Topics  . Alcohol use: No  . Drug use: No     Allergies   Plavix [clopidogrel]   Review of Systems Review of Systems  All other systems reviewed and are negative.    Physical Exam Updated Vital Signs BP (!) 180/93   Pulse 94   Temp 97.8 F  (36.6 C) (Temporal)   Resp 18   Ht 1.981 m (6\' 6" )   Wt 94.8 kg   SpO2 96%   BMI 24.15 kg/m   Physical Exam  Constitutional: He appears well-developed and well-nourished. No distress.  HENT:  Head: Normocephalic and atraumatic.  Right Ear: External ear normal.  Left Ear: External ear normal.  Eyes: Conjunctivae are normal. Right eye exhibits no discharge. Left eye exhibits no discharge. No scleral icterus.  Neck: Neck supple. No tracheal deviation present.  Cardiovascular: Normal rate, regular rhythm and intact distal pulses.  Pulmonary/Chest: Effort normal and breath sounds normal. No stridor. No respiratory distress. He has no wheezes. He has no rales.  Abdominal: Soft. Bowel sounds are normal. He exhibits no distension. There is tenderness. There is no rebound and no guarding.  Suprapubic region tenderness  Genitourinary: Penis normal.  Musculoskeletal: He exhibits no edema or tenderness.  Neurological: He is alert. He has normal strength. No sensory deficit. Cranial nerve deficit: no gross deficits. He exhibits normal muscle tone. He displays no seizure activity. Coordination normal.  Skin: Skin is warm and dry. No rash noted.  Psychiatric: He has a normal mood and affect.  Nursing note and vitals reviewed.    ED Treatments / Results  Labs (all labs ordered are listed, but only abnormal results are displayed) Labs Reviewed  URINALYSIS, ROUTINE W REFLEX MICROSCOPIC - Abnormal; Notable for the following components:      Result Value   Color, Urine STRAW (*)    Hgb urine dipstick SMALL (*)    Bacteria, UA RARE (*)    All other components within normal limits  CBC - Abnormal; Notable for the following components:   WBC 12.7 (*)    All other components within normal limits  BASIC METABOLIC PANEL - Abnormal; Notable for the following components:   Glucose, Bld 187 (*)    All other components within normal limits  CBG MONITORING, ED - Abnormal; Notable for the following  components:   Glucose-Capillary 128 (*)    All other components within normal limits  CBG MONITORING, ED - Abnormal; Notable for the following components:   Glucose-Capillary 180 (*)    All other components within normal limits  URINE CULTURE    EKG None  Radiology Ct Renal Stone Study  Result Date: 09/12/2017 CLINICAL DATA:  Dysuria EXAM: CT ABDOMEN AND PELVIS WITHOUT CONTRAST TECHNIQUE: Multidetector CT imaging of the abdomen and pelvis was performed following the standard protocol without IV contrast. COMPARISON:  None. FINDINGS: Lower chest: Lung bases demonstrate no acute consolidation or effusion. The heart size is within normal limits. Coronary vascular calcification Hepatobiliary: No focal hepatic abnormality or biliary dilatation. Small calcifications in the right  upper quadrant may reflect small stones at the gallbladder neck. No gallbladder wall inflammation. Pancreas: No inflammatory change. Diffusely atrophic. Possible 9 mm low-density cystic lesion at the uncinate process. Spleen: Normal in size without focal abnormality. Adrenals/Urinary Tract: Adrenal glands are within normal limits. Moderate perinephric fat stranding. Mild bilateral hydronephrosis and hydroureter. Probable cysts within the left kidney. No ureteral stone. Bladder distention. Stomach/Bowel: The stomach is nonenlarged. No dilated small bowel. No colon wall thickening. Vascular/Lymphatic: Moderate to marked aortic atherosclerosis. No aneurysm. No significantly enlarged lymph nodes. Reproductive: Enlarged prostate gland with calcification. Mass effect on the posterior bladder Other: No free air or free fluid. Fat within the left inguinal canal. Musculoskeletal: Degenerative changes. No fracture or malalignment. 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 This measures about 6.6 cm transverse. IMPRESSION: 1. Moderate perinephric edema bilaterally with mild hydronephrosis and hydroureter but no  ureteral stone. Moderate to marked bladder distension, question outlet obstruction. There is slight enlargement of the prostate gland. 2. Possible small stones at the neck of the gallbladder 3. Possible 9 mm low-density cystic lesion at the uncinate process of the pancreas. When the patient is clinically stable and able to follow directions and hold their breath (preferably as an outpatient) further evaluation with dedicated abdominal MRI should be considered. Electronically Signed   By: Donavan Foil M.D.   On: 09/12/2017 15:14    Procedures Procedures (including critical care time)  Medications Ordered in ED Medications - No data to display   Initial Impression / Assessment and Plan / ED Course  I have reviewed the triage vital signs and the nursing notes.  Pertinent labs & imaging results that were available during my care of the patient were reviewed by me and considered in my medical decision making (see chart for details).  Clinical Course as of Sep 12 1745  Wed Sep 12, 2017  1615 CT scan shows bladder outlet obstruction   [JK]    Clinical Course User Index [JK] Dorie Rank, MD    She presented to the emergency room for evaluation of urinary discomfort.  Patient's laboratory tests do not suggest urinary tract infection.  Renal function and CBC are normal.  cT scan was performed and does not show any evidence of ureteral stones however there was evidence of bladder outlet obstruction.  he had a Foley catheter placed.  He tolerated this well.  Discharged with a catheter in place I will have him follow-up with urology  Final Clinical Impressions(s) / ED Diagnoses   Final diagnoses:  Urinary retention    ED Discharge Orders    None       Dorie Rank, MD 09/12/17 [phone]

## 2017-09-12 NOTE — ED Notes (Signed)
Patient given discharge instruction, verbalized understand. Patient ambulatory out of the department.  

## 2017-09-12 NOTE — ED Notes (Signed)
Leg bag, given and instruction given if needed to change, pt wanted to leave with bedside drain bag.

## 2017-09-12 NOTE — ED Triage Notes (Signed)
Pt c/o pain with urination that started at 0200

## 2017-09-12 NOTE — ED Notes (Signed)
Pt complains of lower abdominal pain and pain when voiding. Going to xray at the time

## 2017-09-13 LAB — URINE CULTURE
Culture: <10000 — AB
Special Requests: NORMAL

## 2017-09-18 ENCOUNTER — Other Ambulatory Visit: Payer: Self-pay

## 2017-09-18 ENCOUNTER — Emergency Department (HOSPITAL_COMMUNITY)
Admission: EM | Admit: 2017-09-18 | Discharge: 2017-09-18 | Disposition: A | Discharge status: Home / Self Care | Payer: Medicare HMO | Source: Home / Self Care | Attending: Emergency Medicine | Admitting: Emergency Medicine

## 2017-09-18 ENCOUNTER — Encounter (HOSPITAL_COMMUNITY): Payer: Self-pay | Admitting: Emergency Medicine

## 2017-09-18 DIAGNOSIS — E860 Dehydration: Secondary | ICD-10-CM | POA: Diagnosis not present

## 2017-09-18 DIAGNOSIS — I2510 Atherosclerotic heart disease of native coronary artery without angina pectoris: Secondary | ICD-10-CM | POA: Insufficient documentation

## 2017-09-18 DIAGNOSIS — E1169 Type 2 diabetes mellitus with other specified complication: Secondary | ICD-10-CM | POA: Diagnosis not present

## 2017-09-18 DIAGNOSIS — I739 Peripheral vascular disease, unspecified: Secondary | ICD-10-CM | POA: Diagnosis not present

## 2017-09-18 DIAGNOSIS — R338 Other retention of urine: Secondary | ICD-10-CM | POA: Diagnosis not present

## 2017-09-18 DIAGNOSIS — Z87891 Personal history of nicotine dependence: Secondary | ICD-10-CM | POA: Insufficient documentation

## 2017-09-18 DIAGNOSIS — Z79890 Hormone replacement therapy (postmenopausal): Secondary | ICD-10-CM | POA: Diagnosis not present

## 2017-09-18 DIAGNOSIS — R262 Difficulty in walking, not elsewhere classified: Secondary | ICD-10-CM | POA: Diagnosis not present

## 2017-09-18 DIAGNOSIS — E1151 Type 2 diabetes mellitus with diabetic peripheral angiopathy without gangrene: Secondary | ICD-10-CM | POA: Diagnosis not present

## 2017-09-18 DIAGNOSIS — N3090 Cystitis, unspecified without hematuria: Secondary | ICD-10-CM | POA: Diagnosis not present

## 2017-09-18 DIAGNOSIS — E871 Hypo-osmolality and hyponatremia: Secondary | ICD-10-CM | POA: Diagnosis not present

## 2017-09-18 DIAGNOSIS — Z7982 Long term (current) use of aspirin: Secondary | ICD-10-CM | POA: Insufficient documentation

## 2017-09-18 DIAGNOSIS — I10 Essential (primary) hypertension: Secondary | ICD-10-CM

## 2017-09-18 DIAGNOSIS — N390 Urinary tract infection, site not specified: Secondary | ICD-10-CM | POA: Insufficient documentation

## 2017-09-18 DIAGNOSIS — E119 Type 2 diabetes mellitus without complications: Secondary | ICD-10-CM

## 2017-09-18 DIAGNOSIS — R339 Retention of urine, unspecified: Secondary | ICD-10-CM | POA: Diagnosis not present

## 2017-09-18 DIAGNOSIS — E785 Hyperlipidemia, unspecified: Secondary | ICD-10-CM | POA: Diagnosis not present

## 2017-09-18 DIAGNOSIS — R231 Pallor: Secondary | ICD-10-CM | POA: Diagnosis not present

## 2017-09-18 DIAGNOSIS — N400 Enlarged prostate without lower urinary tract symptoms: Secondary | ICD-10-CM | POA: Diagnosis not present

## 2017-09-18 DIAGNOSIS — Z794 Long term (current) use of insulin: Secondary | ICD-10-CM | POA: Diagnosis not present

## 2017-09-18 DIAGNOSIS — B9620 Unspecified Escherichia coli [E. coli] as the cause of diseases classified elsewhere: Secondary | ICD-10-CM | POA: Diagnosis not present

## 2017-09-18 DIAGNOSIS — Z79899 Other long term (current) drug therapy: Secondary | ICD-10-CM | POA: Insufficient documentation

## 2017-09-18 DIAGNOSIS — N401 Enlarged prostate with lower urinary tract symptoms: Secondary | ICD-10-CM | POA: Diagnosis not present

## 2017-09-18 DIAGNOSIS — N130 Hydronephrosis with ureteropelvic junction obstruction: Secondary | ICD-10-CM | POA: Diagnosis not present

## 2017-09-18 DIAGNOSIS — E7800 Pure hypercholesterolemia, unspecified: Secondary | ICD-10-CM | POA: Diagnosis not present

## 2017-09-18 DIAGNOSIS — R531 Weakness: Secondary | ICD-10-CM | POA: Diagnosis not present

## 2017-09-18 DIAGNOSIS — Z833 Family history of diabetes mellitus: Secondary | ICD-10-CM | POA: Diagnosis not present

## 2017-09-18 DIAGNOSIS — D649 Anemia, unspecified: Secondary | ICD-10-CM | POA: Diagnosis present

## 2017-09-18 DIAGNOSIS — E039 Hypothyroidism, unspecified: Secondary | ICD-10-CM | POA: Insufficient documentation

## 2017-09-18 DIAGNOSIS — R509 Fever, unspecified: Secondary | ICD-10-CM | POA: Diagnosis not present

## 2017-09-18 DIAGNOSIS — E1165 Type 2 diabetes mellitus with hyperglycemia: Secondary | ICD-10-CM | POA: Diagnosis not present

## 2017-09-18 LAB — BASIC METABOLIC PANEL
Anion gap: 12 (ref 5–15)
BUN: 17 mg/dL (ref 8–23)
CHLORIDE: 100 mmol/L (ref 98–111)
CO2: 24 mmol/L (ref 22–32)
Calcium: 9.4 mg/dL (ref 8.9–10.3)
Creatinine, Ser: 1.07 mg/dL (ref 0.61–1.24)
GFR calc Af Amer: >60 mL/min (ref >60)
GFR calc non Af Amer: >60 mL/min (ref >60)
Glucose, Bld: 111 mg/dL — ABNORMAL HIGH (ref 70–99)
POTASSIUM: 3.8 mmol/L (ref 3.5–5.1)
Sodium: 136 mmol/L (ref 135–145)

## 2017-09-18 LAB — URINALYSIS, ROUTINE W REFLEX MICROSCOPIC
Bilirubin Urine: NEGATIVE
Glucose, UA: NEGATIVE mg/dL
Ketones, ur: NEGATIVE mg/dL
NITRITE: POSITIVE — AB
Protein, ur: 100 mg/dL — AB
Specific Gravity, Urine: 1.011 (ref 1.005–1.030)
WBC, UA: >50 WBC/hpf — ABNORMAL HIGH (ref 0–5)
pH: 5 (ref 5.0–8.0)

## 2017-09-18 LAB — CBC WITH DIFFERENTIAL/PLATELET
Basophils Absolute: 0 10*3/uL (ref 0.0–0.1)
Basophils Relative: 0 %
EOS PCT: 1 %
Eosinophils Absolute: 0.1 10*3/uL (ref 0.0–0.7)
HCT: 36 % — ABNORMAL LOW (ref 39.0–52.0)
Hemoglobin: 11.5 g/dL — ABNORMAL LOW (ref 13.0–17.0)
LYMPHS ABS: 0.8 10*3/uL (ref 0.7–4.0)
LYMPHS PCT: 7 %
MCH: 29.5 pg (ref 26.0–34.0)
MCHC: 31.9 g/dL (ref 30.0–36.0)
MCV: 92.3 fL (ref 78.0–100.0)
Monocytes Absolute: 0.8 10*3/uL (ref 0.1–1.0)
Monocytes Relative: 7 %
Neutro Abs: 9.5 10*3/uL — ABNORMAL HIGH (ref 1.7–7.7)
Neutrophils Relative %: 85 %
PLATELETS: 247 10*3/uL (ref 150–400)
RBC: 3.9 MIL/uL — AB (ref 4.22–5.81)
RDW: 14 % (ref 11.5–15.5)
WBC: 11.1 10*3/uL — AB (ref 4.0–10.5)

## 2017-09-18 MED ORDER — CEPHALEXIN 500 MG PO CAPS
500.0000 mg | ORAL_CAPSULE | Freq: Once | ORAL | Status: AC
  Administered 2017-09-18: 500 mg via ORAL
  Filled 2017-09-18: qty 1

## 2017-09-18 MED ORDER — CEPHALEXIN 500 MG PO CAPS: 500.0000 mg | ORAL_CAPSULE | Freq: Three times a day (TID) | ORAL | 0 refills | Status: DC

## 2017-09-18 NOTE — ED Provider Notes (Signed)
[facility] EMERGENCY DEPARTMENT Provider Note   CSN: [id number] Arrival date & time: 09/18/17  1931     History   Chief Complaint Chief Complaint  Patient presents with  . Dysuria    HPI Dylan Robles is a 77 y.o. male.  HPI Patient was seen 6 days and ago in the emergency department and had a Foley catheter placed for urinary retention.  Seen by his urologist this morning and had Foley catheter removed.  Had been dribbling small amount of urine and then has been able to urinate since this afternoon.  Complaining of lower abdominal discomfort and distention.  No fever or chills. Past Medical History:  Diagnosis Date  . Coronary artery disease    hyperlipidemia  . Diabetes mellitus   . Diabetes mellitus without complication ([place])   . Hypercholesterolemia   . Hypertension   . Hypothyroidism   . Shortness of breath dyspnea    with exertion  . Vasovagal syncope     Patient Active Problem List   Diagnosis Date Noted  . Bradycardia on ECG 07/31/2011  . DIABETES MELLITUS, UNCONTROLLED, WITH COMPLICATIONS 48/18/5631  . HYPOTHYROIDISM 03/29/2006  . HYPERLIPIDEMIA 01/13/2006  . PERIPHERAL NEUROPATHY 01/13/2006  . HYPERTENSION 01/13/2006  . PERIPHERAL VASCULAR DISEASE 01/13/2006    Past Surgical History:  Procedure Laterality Date  . APPENDECTOMY    . CARDIAC CATHETERIZATION N/A 06/16/2014   Procedure: Left Heart Cath;  Surgeon: Dionisio Alynah Schone, MD;  Location: [place] [facility];  Service: Cardiovascular;  Laterality: N/A;  . CIRCUMCISION, NON-NEWBORN    . HERNIA REPAIR    . THYROID SURGERY          Home Medications    Prior to Admission medications   Medication Sig Start Date End Date Taking? Authorizing Provider  amLODipine (NORVASC) 10 MG tablet Take 1 tablet (10 mg total) by mouth daily. 01/26/12   Alycia Rossetti, MD  aspirin EC 81 MG tablet Take 81 mg by mouth daily.     [provider]  atorvastatin (LIPITOR) 80 MG tablet Take 80 mg by mouth at  bedtime.     [provider]  benazepril (LOTENSIN) 20 MG tablet Take 2 tablets (40 mg total) by mouth daily. 01/26/12   [place], Modena Nunnery, MD  cephALEXin (KEFLEX) 500 MG capsule Take 1 capsule (500 mg total) by mouth 3 (three) times daily. 09/19/17   Julianne Rice, MD  gabapentin (NEURONTIN) 100 MG capsule Take 200 mg by mouth at bedtime.    [provider]  insulin aspart (NOVOLOG) 100 UNIT/ML injection Inject 5 Units into the skin 3 (three) times daily before meals.     [provider]  insulin detemir (LEVEMIR) 100 UNIT/ML injection Inject 22 Units into the skin at bedtime.     [provider]  isosorbide mononitrate (IMDUR) 30 MG 24 hr tablet Take 30 mg by mouth daily.    [provider]  levothyroxine (SYNTHROID, LEVOTHROID) 88 MCG tablet Take 1 tablet (88 mcg total) by mouth daily. 01/26/12   Alycia Rossetti, MD  metFORMIN (GLUCOPHAGE) 1000 MG tablet Take 1 tablet (1,000 mg total) by mouth 2 (two) times daily. 01/26/12   Alycia Rossetti, MD  metoprolol succinate (TOPROL-XL) 25 MG 24 hr tablet Take 25 mg by mouth daily.    [provider]  tamsulosin (FLOMAX) 0.4 MG CAPS capsule Take 0.4 mg by mouth at bedtime.    [provider]  ticagrelor (BRILINTA) 90 MG TABS tablet Take  90 mg by mouth 2 (two) times daily.    [provider]    Family History Family History  Problem Relation Age of Onset  . Diabetes Father   . Hypertension Father   . Cancer Mother   . Diabetes Brother     Social History Social History   Tobacco Use  . Smoking status: Former Research scientist (life sciences)  . Smokeless tobacco: Never Used  Substance Use Topics  . Alcohol use: No  . Drug use: No     Allergies   Plavix [clopidogrel]   Review of Systems Review of Systems  Constitutional: Negative for chills and fever.  Respiratory: Negative for shortness of breath.   Cardiovascular: Negative for chest pain.  Gastrointestinal: Positive for abdominal  pain. Negative for diarrhea, nausea and vomiting.  Genitourinary: Positive for difficulty urinating. Negative for flank pain and frequency.  Musculoskeletal: Negative for back pain, myalgias, neck pain and neck stiffness.  Skin: Negative for rash and wound.  Neurological: Negative for dizziness, weakness, numbness and headaches.  All other systems reviewed and are negative.    Physical Exam Updated Vital Signs BP (!) 150/83   Pulse 84   Temp 98.6 F (37 C)   Resp 18   Ht 6\' 6"  (1.981 m)   Wt 94.8 kg   SpO2 96%   BMI 24.15 kg/m   Physical Exam  Constitutional: He is oriented to person, place, and time. He appears well-developed and well-nourished. No distress.  HENT:  Head: Normocephalic and atraumatic.  Mouth/Throat: Oropharynx is clear and moist. No oropharyngeal exudate.  Eyes: Pupils are equal, round, and reactive to light. EOM are normal.  Neck: Normal range of motion. Neck supple.  Cardiovascular: Normal rate and regular rhythm.  Pulmonary/Chest: Effort normal and breath sounds normal.  Abdominal: Soft. Bowel sounds are normal. There is no tenderness. There is no rebound and no guarding.  Musculoskeletal: Normal range of motion. He exhibits no edema or tenderness.  No CVA ttp  Neurological: He is alert and oriented to person, place, and time.  Skin: Skin is warm and dry. No rash noted. He is not diaphoretic. No erythema.  Psychiatric: He has a normal mood and affect. His behavior is normal.  Nursing note and vitals reviewed.    ED Treatments / Results  Labs (all labs ordered are listed, but only abnormal results are displayed) Labs Reviewed  URINALYSIS, ROUTINE W REFLEX MICROSCOPIC - Abnormal; Notable for the following components:      Result Value   APPearance CLOUDY (*)    Hgb urine dipstick MODERATE (*)    Protein, ur 100 (*)    Nitrite POSITIVE (*)    Leukocytes, UA MODERATE (*)    WBC, UA >50 (*)    Bacteria, UA FEW (*)    All other components within  normal limits  BASIC METABOLIC PANEL - Abnormal; Notable for the following components:   Glucose, Bld 111 (*)    All other components within normal limits  CBC WITH DIFFERENTIAL/PLATELET - Abnormal; Notable for the following components:   WBC 11.1 (*)    RBC 3.90 (*)    Hemoglobin 11.5 (*)    HCT 36.0 (*)    Neutro Abs 9.5 (*)    All other components within normal limits  URINE CULTURE    EKG None  Radiology No results found.  Procedures Procedures (including critical care time)  Medications Ordered in ED Medications  cephALEXin (KEFLEX) capsule 500 mg (500 mg Oral Given 09/18/17 2235)  Initial Impression / Assessment and Plan / ED Course  I have reviewed the triage vital signs and the nursing notes.  Pertinent labs & imaging results that were available during my care of the patient were reviewed by me and considered in my medical decision making (see chart for details).     Foley catheter placed with complete resolution of the symptoms, tachycardia and hypertension.  Evidence of urinary tract infection.  Urine was sent for culture and patient started on antibiotics.  Understands need to call and make appointment follow-up with urology.  Return precautions given.  Final Clinical Impressions(s) / ED Diagnoses   Final diagnoses:  Lower urinary tract infectious disease  Urinary retention    ED Discharge Orders         Ordered    cephALEXin (KEFLEX) 500 MG capsule  3 times daily     09/18/17 2242           Julianne Rice, MD 09/18/17 2244

## 2017-09-18 NOTE — ED Triage Notes (Signed)
Pt was seen by urology today and had foley catheter removed, has been able to urinate since but is having dysuria.

## 2017-09-20 ENCOUNTER — Inpatient Hospital Stay (HOSPITAL_COMMUNITY)
Admission: EM | Admit: 2017-09-20 | Discharge: 2017-09-21 | DRG: 690 | Disposition: A | Discharge status: Home Health | Payer: Medicare HMO | Attending: Family Medicine | Admitting: Family Medicine

## 2017-09-20 ENCOUNTER — Other Ambulatory Visit: Payer: Self-pay

## 2017-09-20 ENCOUNTER — Encounter (HOSPITAL_COMMUNITY): Payer: Self-pay

## 2017-09-20 DIAGNOSIS — R262 Difficulty in walking, not elsewhere classified: Secondary | ICD-10-CM | POA: Diagnosis not present

## 2017-09-20 DIAGNOSIS — B9620 Unspecified Escherichia coli [E. coli] as the cause of diseases classified elsewhere: Secondary | ICD-10-CM | POA: Diagnosis present

## 2017-09-20 DIAGNOSIS — E1169 Type 2 diabetes mellitus with other specified complication: Secondary | ICD-10-CM | POA: Diagnosis present

## 2017-09-20 DIAGNOSIS — Z7982 Long term (current) use of aspirin: Secondary | ICD-10-CM

## 2017-09-20 DIAGNOSIS — R531 Weakness: Secondary | ICD-10-CM

## 2017-09-20 DIAGNOSIS — R339 Retention of urine, unspecified: Secondary | ICD-10-CM | POA: Diagnosis present

## 2017-09-20 DIAGNOSIS — Z79890 Hormone replacement therapy (postmenopausal): Secondary | ICD-10-CM

## 2017-09-20 DIAGNOSIS — N3090 Cystitis, unspecified without hematuria: Secondary | ICD-10-CM

## 2017-09-20 DIAGNOSIS — I10 Essential (primary) hypertension: Secondary | ICD-10-CM | POA: Diagnosis not present

## 2017-09-20 DIAGNOSIS — Z794 Long term (current) use of insulin: Secondary | ICD-10-CM

## 2017-09-20 DIAGNOSIS — E039 Hypothyroidism, unspecified: Secondary | ICD-10-CM | POA: Diagnosis present

## 2017-09-20 DIAGNOSIS — E1165 Type 2 diabetes mellitus with hyperglycemia: Secondary | ICD-10-CM | POA: Diagnosis present

## 2017-09-20 DIAGNOSIS — R231 Pallor: Secondary | ICD-10-CM | POA: Diagnosis not present

## 2017-09-20 DIAGNOSIS — E860 Dehydration: Secondary | ICD-10-CM | POA: Diagnosis present

## 2017-09-20 DIAGNOSIS — I25118 Atherosclerotic heart disease of native coronary artery with other forms of angina pectoris: Secondary | ICD-10-CM

## 2017-09-20 DIAGNOSIS — N390 Urinary tract infection, site not specified: Secondary | ICD-10-CM | POA: Diagnosis not present

## 2017-09-20 DIAGNOSIS — E7800 Pure hypercholesterolemia, unspecified: Secondary | ICD-10-CM | POA: Diagnosis present

## 2017-09-20 DIAGNOSIS — I739 Peripheral vascular disease, unspecified: Secondary | ICD-10-CM | POA: Diagnosis present

## 2017-09-20 DIAGNOSIS — R509 Fever, unspecified: Secondary | ICD-10-CM | POA: Diagnosis not present

## 2017-09-20 DIAGNOSIS — Z833 Family history of diabetes mellitus: Secondary | ICD-10-CM

## 2017-09-20 DIAGNOSIS — Z79899 Other long term (current) drug therapy: Secondary | ICD-10-CM

## 2017-09-20 DIAGNOSIS — I2510 Atherosclerotic heart disease of native coronary artery without angina pectoris: Secondary | ICD-10-CM | POA: Diagnosis present

## 2017-09-20 DIAGNOSIS — E1151 Type 2 diabetes mellitus with diabetic peripheral angiopathy without gangrene: Secondary | ICD-10-CM | POA: Diagnosis present

## 2017-09-20 DIAGNOSIS — E785 Hyperlipidemia, unspecified: Secondary | ICD-10-CM | POA: Diagnosis present

## 2017-09-20 DIAGNOSIS — E871 Hypo-osmolality and hyponatremia: Secondary | ICD-10-CM | POA: Diagnosis present

## 2017-09-20 DIAGNOSIS — D649 Anemia, unspecified: Secondary | ICD-10-CM | POA: Diagnosis present

## 2017-09-20 HISTORY — DX: Weakness: R53.1

## 2017-09-20 HISTORY — DX: Urinary tract infection, site not specified: N39.0

## 2017-09-20 LAB — BASIC METABOLIC PANEL
Anion gap: 8 (ref 5–15)
BUN: 15 mg/dL (ref 8–23)
CALCIUM: 8.6 mg/dL — AB (ref 8.9–10.3)
CO2: 26 mmol/L (ref 22–32)
CREATININE: 1.17 mg/dL (ref 0.61–1.24)
Chloride: 98 mmol/L (ref 98–111)
GFR, EST NON AFRICAN AMERICAN: 57 mL/min — AB (ref >60)
Glucose, Bld: 196 mg/dL — ABNORMAL HIGH (ref 70–99)
Potassium: 4 mmol/L (ref 3.5–5.1)
SODIUM: 132 mmol/L — AB (ref 135–145)

## 2017-09-20 LAB — CBC WITH DIFFERENTIAL/PLATELET
BASOS PCT: 0 %
Basophils Absolute: 0 10*3/uL (ref 0.0–0.1)
EOS ABS: 0.1 10*3/uL (ref 0.0–0.7)
EOS PCT: 1 %
HCT: 33 % — ABNORMAL LOW (ref 39.0–52.0)
Hemoglobin: 10.8 g/dL — ABNORMAL LOW (ref 13.0–17.0)
Lymphocytes Relative: 8 %
Lymphs Abs: 0.6 10*3/uL — ABNORMAL LOW (ref 0.7–4.0)
MCH: 29.8 pg (ref 26.0–34.0)
MCHC: 32.7 g/dL (ref 30.0–36.0)
MCV: 90.9 fL (ref 78.0–100.0)
MONO ABS: 0.8 10*3/uL (ref 0.1–1.0)
MONOS PCT: 10 %
NEUTROS ABS: 6.5 10*3/uL (ref 1.7–7.7)
Neutrophils Relative %: 81 %
PLATELETS: 250 10*3/uL (ref 150–400)
RBC: 3.63 MIL/uL — ABNORMAL LOW (ref 4.22–5.81)
RDW: 13.9 % (ref 11.5–15.5)
WBC: 8 10*3/uL (ref 4.0–10.5)

## 2017-09-20 LAB — RETICULOCYTES
RBC.: 3.63 MIL/uL — ABNORMAL LOW (ref 4.22–5.81)
RETIC COUNT ABSOLUTE: 32.7 10*3/uL (ref 19.0–186.0)
RETIC CT PCT: 0.9 % (ref 0.4–3.1)

## 2017-09-20 LAB — URINALYSIS, ROUTINE W REFLEX MICROSCOPIC
BILIRUBIN URINE: NEGATIVE
GLUCOSE, UA: 50 mg/dL — AB
KETONES UR: NEGATIVE mg/dL
Nitrite: NEGATIVE
PH: 5 (ref 5.0–8.0)
PROTEIN: 100 mg/dL — AB
Specific Gravity, Urine: 1.023 (ref 1.005–1.030)

## 2017-09-20 LAB — TROPONIN I: TROPONIN I: 0.03 ng/mL — AB (ref <0.03)

## 2017-09-20 LAB — TSH: TSH: 1.039 u[IU]/mL (ref 0.350–4.500)

## 2017-09-20 LAB — LACTIC ACID, PLASMA: LACTIC ACID, VENOUS: 1.7 mmol/L (ref 0.5–1.9)

## 2017-09-20 MED ORDER — INSULIN ASPART 100 UNIT/ML ~~LOC~~ SOLN: 0.0000 [IU] | Freq: Every day | SUBCUTANEOUS | Status: DC

## 2017-09-20 MED ORDER — METFORMIN HCL 500 MG PO TABS
1000.0000 mg | ORAL_TABLET | Freq: Two times a day (BID) | ORAL | Status: DC
  Administered 2017-09-21: 1000 mg via ORAL
  Filled 2017-09-20: qty 2

## 2017-09-20 MED ORDER — ISOSORBIDE MONONITRATE ER 60 MG PO TB24
30.0000 mg | ORAL_TABLET | Freq: Every day | ORAL | Status: DC
  Administered 2017-09-21: 30 mg via ORAL
  Filled 2017-09-20 (×2): qty 1

## 2017-09-20 MED ORDER — TAMSULOSIN HCL 0.4 MG PO CAPS
0.4000 mg | ORAL_CAPSULE | Freq: Every day | ORAL | Status: DC
  Administered 2017-09-21: 0.4 mg via ORAL
  Filled 2017-09-20: qty 1

## 2017-09-20 MED ORDER — AMLODIPINE BESYLATE 5 MG PO TABS
10.0000 mg | ORAL_TABLET | Freq: Every day | ORAL | Status: DC
  Administered 2017-09-21: 10 mg via ORAL
  Filled 2017-09-20: qty 2

## 2017-09-20 MED ORDER — GABAPENTIN 100 MG PO CAPS
200.0000 mg | ORAL_CAPSULE | Freq: Every day | ORAL | Status: DC
  Administered 2017-09-21: 200 mg via ORAL
  Filled 2017-09-20: qty 2

## 2017-09-20 MED ORDER — METOPROLOL SUCCINATE ER 25 MG PO TB24
25.0000 mg | ORAL_TABLET | Freq: Every day | ORAL | Status: DC
  Administered 2017-09-21: 25 mg via ORAL
  Filled 2017-09-20: qty 1

## 2017-09-20 MED ORDER — ACETAMINOPHEN 325 MG PO TABS: 650.0000 mg | ORAL_TABLET | Freq: Four times a day (QID) | ORAL | Status: DC | PRN

## 2017-09-20 MED ORDER — LEVOFLOXACIN IN D5W 750 MG/150ML IV SOLN
750.0000 mg | Freq: Once | INTRAVENOUS | Status: AC
  Administered 2017-09-20: 750 mg via INTRAVENOUS
  Filled 2017-09-20: qty 150

## 2017-09-20 MED ORDER — BENAZEPRIL HCL 10 MG PO TABS
40.0000 mg | ORAL_TABLET | Freq: Every day | ORAL | Status: DC
  Administered 2017-09-21: 40 mg via ORAL
  Filled 2017-09-20: qty 1
  Filled 2017-09-20: qty 4

## 2017-09-20 MED ORDER — ATORVASTATIN CALCIUM 40 MG PO TABS
80.0000 mg | ORAL_TABLET | Freq: Every day | ORAL | Status: DC
  Administered 2017-09-21: 80 mg via ORAL
  Filled 2017-09-20: qty 2

## 2017-09-20 MED ORDER — ONDANSETRON HCL 4 MG/2ML IJ SOLN: 4.0000 mg | Freq: Four times a day (QID) | INTRAMUSCULAR | Status: DC | PRN

## 2017-09-20 MED ORDER — SODIUM CHLORIDE 0.9 % IV SOLN
INTRAVENOUS | Status: DC | PRN
  Administered 2017-09-20: 250 mL via INTRAVENOUS

## 2017-09-20 MED ORDER — INSULIN ASPART 100 UNIT/ML ~~LOC~~ SOLN
0.0000 [IU] | Freq: Three times a day (TID) | SUBCUTANEOUS | Status: DC
  Administered 2017-09-21 (×2): 1 [IU] via SUBCUTANEOUS

## 2017-09-20 MED ORDER — INFLUENZA VAC SPLIT HIGH-DOSE 0.5 ML IM SUSY
0.5000 mL | PREFILLED_SYRINGE | INTRAMUSCULAR | Status: DC
  Filled 2017-09-20: qty 0.5

## 2017-09-20 MED ORDER — INSULIN DETEMIR 100 UNIT/ML ~~LOC~~ SOLN
24.0000 [IU] | Freq: Every day | SUBCUTANEOUS | Status: DC
  Administered 2017-09-21: 24 [IU] via SUBCUTANEOUS
  Filled 2017-09-20 (×4): qty 0.24

## 2017-09-20 MED ORDER — SODIUM CHLORIDE 0.9 % IV BOLUS
1000.0000 mL | Freq: Once | INTRAVENOUS | Status: AC
  Administered 2017-09-20: 1000 mL via INTRAVENOUS

## 2017-09-20 MED ORDER — TICAGRELOR 90 MG PO TABS
90.0000 mg | ORAL_TABLET | Freq: Two times a day (BID) | ORAL | Status: DC
  Administered 2017-09-21 (×2): 90 mg via ORAL
  Filled 2017-09-20 (×2): qty 1

## 2017-09-20 MED ORDER — SODIUM CHLORIDE 0.9 % IV SOLN
INTRAVENOUS | Status: DC
  Administered 2017-09-21: via INTRAVENOUS

## 2017-09-20 MED ORDER — ONDANSETRON HCL 4 MG PO TABS: 4.0000 mg | ORAL_TABLET | Freq: Four times a day (QID) | ORAL | Status: DC | PRN

## 2017-09-20 MED ORDER — INSULIN ASPART 100 UNIT/ML ~~LOC~~ SOLN
5.0000 [IU] | Freq: Three times a day (TID) | SUBCUTANEOUS | Status: DC
  Administered 2017-09-21 (×2): 5 [IU] via SUBCUTANEOUS

## 2017-09-20 MED ORDER — ACETAMINOPHEN 650 MG RE SUPP: 650.0000 mg | Freq: Four times a day (QID) | RECTAL | Status: DC | PRN

## 2017-09-20 MED ORDER — LEVOTHYROXINE SODIUM 88 MCG PO TABS
88.0000 ug | ORAL_TABLET | Freq: Every day | ORAL | Status: DC
  Administered 2017-09-21: 88 ug via ORAL
  Filled 2017-09-20: qty 1

## 2017-09-20 MED ORDER — ASPIRIN EC 81 MG PO TBEC
81.0000 mg | DELAYED_RELEASE_TABLET | Freq: Every day | ORAL | Status: DC
  Administered 2017-09-21: 81 mg via ORAL
  Filled 2017-09-20: qty 1

## 2017-09-20 NOTE — H&P (Addendum)
History and Physical    BRAYCEN BURANDT PTW:[id number] DOB: August 15, 1940 DOA: 09/20/2017  PCP: Iona Beard, MD   Patient coming from: Home  Chief Complaint: Weakness-generalized, fever  HPI: Dylan Robles is a 77 y.o. male with medical history significant for CAD, PVD, type 2 diabetes, dyslipidemia, hypertension, and hypothyroidism who presented to the emergency department with worsening generalized weakness as well as some fever at home.  Patient recently had placement of a urinary catheter due to urinary retention approximately 1 week ago and was noted to have a UTI 2 days ago when a Foley catheter had to be replaced after recent removal by his urologist in [place].  His urine culture was noted to grow E. coli with sensitivities still pending.  He was empirically given cephalexin and had been taking his medications as prescribed over the last 2 days.  Unfortunately, it appears that he is symptomatically worsening and has had poor appetite and low-grade subjective fevers at home.  Wife at the bedside states that she cannot even assist her husband with ambulation on account of the weakness. Patient denies any chills, rigors, discharge, hematuria, abdominal pain, nausea, vomiting, chest pain, or shortness of breath.   ED Course: Vital signs are stable and patient is currently afebrile with temperature of 99.8 Fahrenheit.  His hemoglobin is noted to be 10.8 and was previously 14 on 9/4.  Sodium is 132 and glucose is 196.  Lactic acid is 1.7 and creatinine is stable at 1.17.  Troponin is 0.03.  Urine sensitivities are currently pending.  He has been started on Levaquin as well as some IV fluid in the ED.  Review of Systems: All others reviewed and otherwise negative.  Past Medical History:  Diagnosis Date  . Coronary artery disease    hyperlipidemia  . Diabetes mellitus   . Diabetes mellitus without complication ([place])   . Hypercholesterolemia   . Hypertension   . Hypothyroidism   . Shortness of  breath dyspnea    with exertion  . Vasovagal syncope     Past Surgical History:  Procedure Laterality Date  . APPENDECTOMY    . CARDIAC CATHETERIZATION N/A 06/16/2014   Procedure: Left Heart Cath;  Surgeon: Dionisio David, MD;  Location: [place] [facility];  Service: Cardiovascular;  Laterality: N/A;  . CIRCUMCISION, NON-NEWBORN    . HERNIA REPAIR    . THYROID SURGERY       reports that he has quit smoking. He has never used smokeless tobacco. He reports that he does not drink alcohol or use drugs.  Allergies  Allergen Reactions  . Plavix [Clopidogrel] Hives    Family History  Problem Relation Age of Onset  . Diabetes Father   . Hypertension Father   . Cancer Mother   . Diabetes Brother     Prior to Admission medications   Medication Sig Start Date End Date Taking? Authorizing Provider  amLODipine (NORVASC) 10 MG tablet Take 1 tablet (10 mg total) by mouth daily. 01/26/12  Yes [place], Modena Nunnery, MD  aspirin EC 81 MG tablet Take 81 mg by mouth daily.    Yes [provider]  atorvastatin (LIPITOR) 80 MG tablet Take 80 mg by mouth at bedtime.    Yes [provider]  benazepril (LOTENSIN) 20 MG tablet Take 2 tablets (40 mg total) by mouth daily. 01/26/12  Yes [place], Modena Nunnery, MD  cephALEXin (KEFLEX) 500 MG capsule Take 1 capsule (500 mg total) by mouth 3 (three) times daily. 09/19/17  Yes Julianne Rice, MD  gabapentin (NEURONTIN) 100 MG capsule Take 200 mg by mouth at bedtime.   Yes [provider]  insulin aspart (NOVOLOG) 100 UNIT/ML injection Inject 5 Units into the skin 3 (three) times daily before meals.    Yes [provider]  insulin detemir (LEVEMIR) 100 UNIT/ML injection Inject 24 Units into the skin at bedtime.    Yes [provider]  isosorbide mononitrate (IMDUR) 30 MG 24 hr tablet Take 30 mg by mouth daily.   Yes [provider]  levothyroxine (SYNTHROID, LEVOTHROID) 88 MCG tablet Take 1 tablet (88 mcg total) by  mouth daily. 01/26/12  Yes [place], Modena Nunnery, MD  metFORMIN (GLUCOPHAGE) 1000 MG tablet Take 1 tablet (1,000 mg total) by mouth 2 (two) times daily. 01/26/12  Yes [place], Modena Nunnery, MD  metoprolol succinate (TOPROL-XL) 25 MG 24 hr tablet Take 25 mg by mouth daily.   Yes [provider]  tamsulosin (FLOMAX) 0.4 MG CAPS capsule Take 0.4 mg by mouth at bedtime.   Yes [provider]  ticagrelor (BRILINTA) 90 MG TABS tablet Take 90 mg by mouth 2 (two) times daily.   Yes [provider]    Physical Exam: Vitals:   09/20/17 1909 09/20/17 1911 09/20/17 1930  BP:  (!) 168/86 (!) 148/80  Pulse:  96 90  Resp:  (!) 21 18  Temp:  99.8 F (37.7 C)   TempSrc:  Oral   SpO2:  98%   Weight: 94.8 kg    Height: 6\' 6"  (1.981 m)      Constitutional: NAD, calm, comfortable Vitals:   09/20/17 1909 09/20/17 1911 09/20/17 1930  BP:  (!) 168/86 (!) 148/80  Pulse:  96 90  Resp:  (!) 21 18  Temp:  99.8 F (37.7 C)   TempSrc:  Oral   SpO2:  98%   Weight: 94.8 kg    Height: 6\' 6"  (1.981 m)     Eyes: lids and conjunctivae normal ENMT: Mucous membranes are moist.  Neck: normal, supple Respiratory: clear to auscultation bilaterally. Normal respiratory effort. No accessory muscle use.  Currently on room air. Cardiovascular: Regular rate and rhythm, no murmurs. No extremity edema. Abdomen: no tenderness, no distention. Bowel sounds positive.  Musculoskeletal:  No joint deformity upper and lower extremities.   Skin: no rashes, lesions, ulcers.  Psychiatric: Normal judgment and insight. Alert and oriented x 3. Normal mood.  Foley with clear, yellow, urine output.  Labs on Admission: I have personally reviewed following labs and imaging studies  CBC: Recent Labs  Lab 09/18/17 2131 09/20/17 2000  WBC 11.1* 8.0  NEUTROABS 9.5* 6.5  HGB 11.5* 10.8*  HCT 36.0* 33.0*  MCV 92.3 90.9  PLT 247 749   Basic Metabolic Panel: Recent Labs  Lab 09/18/17 2131 09/20/17 2000  NA 136  132*  K 3.8 4.0  CL 100 98  CO2 24 26  GLUCOSE 111* 196*  BUN 17 15  CREATININE 1.07 1.17  CALCIUM 9.4 8.6*   GFR: Estimated Creatinine Clearance: 66.2 mL/min (by C-G formula based on SCr of 1.17 mg/dL). Liver Function Tests: No results for input(s): AST, ALT, ALKPHOS, BILITOT, PROT, ALBUMIN in the last 168 hours. No results for input(s): LIPASE, AMYLASE in the last 168 hours. No results for input(s): AMMONIA in the last 168 hours. Coagulation Profile: No results for input(s): INR, PROTIME in the last 168 hours. Cardiac Enzymes: Recent Labs  Lab 09/20/17 2000  TROPONINI 0.03*   BNP (last 3  results) No results for input(s): PROBNP in the last 8760 hours. HbA1C: No results for input(s): HGBA1C in the last 72 hours. CBG: No results for input(s): GLUCAP in the last 168 hours. Lipid Profile: No results for input(s): CHOL, HDL, LDLCALC, TRIG, CHOLHDL, LDLDIRECT in the last 72 hours. Thyroid Function Tests: No results for input(s): TSH, T4TOTAL, FREET4, T3FREE, THYROIDAB in the last 72 hours. Anemia Panel: No results for input(s): VITAMINB12, FOLATE, FERRITIN, TIBC, IRON, RETICCTPCT in the last 72 hours. Urine analysis:    Component Value Date/Time   COLORURINE AMBER (A) 09/20/2017 1917   APPEARANCEUR CLOUDY (A) 09/20/2017 1917   LABSPEC 1.023 09/20/2017 1917   PHURINE 5.0 09/20/2017 1917   GLUCOSEU 50 (A) 09/20/2017 1917   HGBUR LARGE (A) 09/20/2017 1917   BILIRUBINUR NEGATIVE 09/20/2017 1917   KETONESUR NEGATIVE 09/20/2017 1917   PROTEINUR 100 (A) 09/20/2017 1917   UROBILINOGEN 0.2 07/28/2011 1351   NITRITE NEGATIVE 09/20/2017 1917   LEUKOCYTESUR MODERATE (A) 09/20/2017 1917    Radiological Exams on Admission: No results found.  EKG: Independently reviewed.  Sinus rhythm 95 bpm.  Assessment/Plan Principal Problem:   Acute lower UTI Active Problems:   Hypothyroidism   Type 2 diabetes mellitus with hyperlipidemia ([facility])   Essential hypertension   PERIPHERAL  VASCULAR DISEASE   CAD (coronary artery disease)    1. Generalized weakness and fever secondary to persistent E. coli UTI.  Patient appears to have generalized worsening symptomatology which appears to be attributed to his UTI as well as some dehydration.  We will plan for observation overnight until sensitivities can return by tomorrow and maintain on IV Levaquin in the meantime.  Maintain Foley catheter with plans for urology follow-up with appointment on Tuesday of next week. Fall precautions and PT evaluation. 2. Mild hyponatremia.  Likely related to dehydration and will maintain on gentle IV normal saline.  Repeat labs in a.m. 3. Worsening anemia.  No overt bleeding identified.  Recheck CBC in a.m. and also check anemia panel as well as FOBT. 4. Type 2 diabetes with hyperglycemia.  Maintain on home metformin and add sliding scale insulin.  Maintain on home mealtime insulin as well with carb modified diet. 5. CAD/PVD.  Maintain on aspirin, Imdur, and Brilinta. 6. Dyslipidemia.  Maintain on statin. 7. Hypothyroidism.  Maintain on levothyroxine.  Recheck TSH. 8. Hypertension.  Maintain on amlodipine, benazepril, Imdur, and metoprolol. 9. Urinary retention with Foley placement.  Maintain on Flomax.  Follow-up with urology in the outpatient setting for removal as scheduled.   DVT prophylaxis: SCDs Code Status: Full Family Communication: Wife and niece at bedside Disposition Plan: Admit for IV antibiotics and IV fluid and monitor sensitivities Consults called: None Admission status: Observation, MedSurg   Rodena Goldmann DO [facility] Pager [phone]  If 7PM-7AM, please contact night-coverage [email or web address] Password [email or web address]  09/20/2017, 10:40 PM

## 2017-09-20 NOTE — ED Triage Notes (Signed)
Pt in by [facility] for generalized weakness and fever.  Pt states he had a urinary catheter placed a week ago and was removed on Tuesday morning.  Pt states he again couldn't pass his urine so he came here Tuesday night and had the catheter put back in.   Pt states the fever and weakness started today

## 2017-09-20 NOTE — ED Notes (Signed)
Date and time results received: 09/20/17 9:37 PM  Test: Troponin Critical Value: 0.03  Name of Provider Notified: Sabra Heck  Orders Received? Or Actions Taken?: no new orders at this time.

## 2017-09-20 NOTE — ED Provider Notes (Signed)
[facility] EMERGENCY DEPARTMENT Provider Note   CSN: [id number] Arrival date & time: 09/20/17  1904     History   Chief Complaint Chief Complaint  Patient presents with  . Weakness  . Fever    HPI Dylan Robles is a 77 y.o. male.  HPI  77 y/o male - hx of recent urinary catheter placement for urinary retention, he also has a history of diabetes, hypertension and hypercholesterolemia.  He presents to the hospital today with a complaint of generalized weakness.  He was noted to have a urinary tract infection 2 days ago when a Foley catheter had to be replaced after recently being removed after a episode of urinary retention.  His urine culture grew E. coli, the sensitivities are not yet returned however the patient was given a prescription for cephalexin, has had 1 dose on Tuesday night, 3 doses yesterday and has had 2 doses so far today.  Despite this he has had some progressive generalized weakness, is requiring some assistance when he walks because of generalized weakness and became diaphoretic prior to arrival today with a subjective fever.  No nausea, no headache, no chest or abdominal pain, no back pain, no rashes.  The symptoms are persistent and gradually worsening.  Past Medical History:  Diagnosis Date  . Coronary artery disease    hyperlipidemia  . Diabetes mellitus   . Diabetes mellitus without complication ([place])   . Hypercholesterolemia   . Hypertension   . Hypothyroidism   . Shortness of breath dyspnea    with exertion  . Vasovagal syncope     Patient Active Problem List   Diagnosis Date Noted  . Bradycardia on ECG 07/31/2011  . DIABETES MELLITUS, UNCONTROLLED, WITH COMPLICATIONS 35/00/9381  . HYPOTHYROIDISM 03/29/2006  . HYPERLIPIDEMIA 01/13/2006  . PERIPHERAL NEUROPATHY 01/13/2006  . HYPERTENSION 01/13/2006  . PERIPHERAL VASCULAR DISEASE 01/13/2006    Past Surgical History:  Procedure Laterality Date  . APPENDECTOMY    . CARDIAC CATHETERIZATION N/A  06/16/2014   Procedure: Left Heart Cath;  Surgeon: Dionisio David, MD;  Location: [place] [facility];  Service: Cardiovascular;  Laterality: N/A;  . CIRCUMCISION, NON-NEWBORN    . HERNIA REPAIR    . THYROID SURGERY          Home Medications    Prior to Admission medications   Medication Sig Start Date End Date Taking? Authorizing Provider  amLODipine (NORVASC) 10 MG tablet Take 1 tablet (10 mg total) by mouth daily. 01/26/12  Yes Nyssa, Modena Nunnery, MD  aspirin EC 81 MG tablet Take 81 mg by mouth daily.    Yes [provider]  atorvastatin (LIPITOR) 80 MG tablet Take 80 mg by mouth at bedtime.    Yes [provider]  benazepril (LOTENSIN) 20 MG tablet Take 2 tablets (40 mg total) by mouth daily. 01/26/12  Yes Chappell, Modena Nunnery, MD  cephALEXin (KEFLEX) 500 MG capsule Take 1 capsule (500 mg total) by mouth 3 (three) times daily. 09/19/17  Yes Julianne Rice, MD  gabapentin (NEURONTIN) 100 MG capsule Take 200 mg by mouth at bedtime.   Yes [provider]  insulin aspart (NOVOLOG) 100 UNIT/ML injection Inject 5 Units into the skin 3 (three) times daily before meals.    Yes [provider]  insulin detemir (LEVEMIR) 100 UNIT/ML injection Inject 24 Units into the skin at bedtime.    Yes [provider]  isosorbide mononitrate (IMDUR) 30 MG 24 hr tablet Take 30 mg by mouth  daily.   Yes [provider]  levothyroxine (SYNTHROID, LEVOTHROID) 88 MCG tablet Take 1 tablet (88 mcg total) by mouth daily. 01/26/12  Yes Simms, Modena Nunnery, MD  metFORMIN (GLUCOPHAGE) 1000 MG tablet Take 1 tablet (1,000 mg total) by mouth 2 (two) times daily. 01/26/12  Yes Fyffe, Modena Nunnery, MD  metoprolol succinate (TOPROL-XL) 25 MG 24 hr tablet Take 25 mg by mouth daily.   Yes [provider]  tamsulosin (FLOMAX) 0.4 MG CAPS capsule Take 0.4 mg by mouth at bedtime.   Yes [provider]  ticagrelor (BRILINTA) 90 MG TABS tablet Take 90 mg by mouth 2 (two)  times daily.   Yes [provider]    Family History Family History  Problem Relation Age of Onset  . Diabetes Father   . Hypertension Father   . Cancer Mother   . Diabetes Brother     Social History Social History   Tobacco Use  . Smoking status: Former Research scientist (life sciences)  . Smokeless tobacco: Never Used  Substance Use Topics  . Alcohol use: No  . Drug use: No     Allergies   Plavix [clopidogrel]   Review of Systems Review of Systems  All other systems reviewed and are negative.    Physical Exam Updated Vital Signs BP (!) 148/80   Pulse 90   Temp 99.8 F (37.7 C) (Oral)   Resp 18   Ht 1.981 m (6\' 6" )   Wt 94.8 kg   SpO2 98%   BMI 24.15 kg/m   Physical Exam  Constitutional: He appears well-developed and well-nourished. No distress.  HENT:  Head: Normocephalic and atraumatic.  Mouth/Throat: Oropharynx is clear and moist. No oropharyngeal exudate.  Eyes: Pupils are equal, round, and reactive to light. Conjunctivae and EOM are normal. Right eye exhibits no discharge. Left eye exhibits no discharge. No scleral icterus.  Neck: Normal range of motion. Neck supple. No JVD present. No thyromegaly present.  Cardiovascular: Normal rate, regular rhythm, normal heart sounds and intact distal pulses. Exam reveals no gallop and no friction rub.  No murmur heard. Pulmonary/Chest: Effort normal and breath sounds normal. No respiratory distress. He has no wheezes. He has no rales.  Abdominal: Soft. Bowel sounds are normal. He exhibits no distension and no mass. There is no tenderness.  Genitourinary:  Genitourinary Comments: Foley catheter in place, well-seated in the urinary bladder, dark-colored urine in the Foley tubing  Musculoskeletal: Normal range of motion. He exhibits no edema or tenderness.  Lymphadenopathy:    He has no cervical adenopathy.  Neurological: He is alert. Coordination normal.  The patient has normal speech, no slurring, no facial droop, normal  strength in all 4 extremities, he is able to follow commands with normal accuracy.  He is able to sit up in the bed.  Skin: Skin is warm and dry. No rash noted. No erythema.  Psychiatric: He has a normal mood and affect. His behavior is normal.  Nursing note and vitals reviewed.    ED Treatments / Results  Labs (all labs ordered are listed, but only abnormal results are displayed) Labs Reviewed  URINALYSIS, ROUTINE W REFLEX MICROSCOPIC - Abnormal; Notable for the following components:      Result Value   Color, Urine AMBER (*)    APPearance CLOUDY (*)    Glucose, UA 50 (*)    Hgb urine dipstick LARGE (*)    Protein, ur 100 (*)    Leukocytes, UA MODERATE (*)    WBC, UA >  50 (*)    Bacteria, UA MANY (*)    All other components within normal limits  CBC WITH DIFFERENTIAL/PLATELET - Abnormal; Notable for the following components:   RBC 3.63 (*)    Hemoglobin 10.8 (*)    HCT 33.0 (*)    Lymphs Abs 0.6 (*)    All other components within normal limits  BASIC METABOLIC PANEL - Abnormal; Notable for the following components:   Sodium 132 (*)    Glucose, Bld 196 (*)    Calcium 8.6 (*)    GFR calc non Af Amer 57 (*)    All other components within normal limits  TROPONIN I - Abnormal; Notable for the following components:   Troponin I 0.03 (*)    All other components within normal limits  LACTIC ACID, PLASMA    EKG EKG Interpretation  Date/Time:  Thursday September 20 2017 19:09:45 EDT Ventricular Rate:  95 PR Interval:    QRS Duration: 85 QT Interval:  354 QTC Calculation: 445 R Axis:   8 Text Interpretation:  Sinus rhythm Atrial premature complex Abnormal R-wave progression, early transition Borderline repol abnormality, lateral leads Since last tracing rate faster Confirmed by Noemi Chapel [phone]) on 09/20/2017 7:12:24 PM   Radiology No results found.  Procedures Procedures (including critical care time)  Medications Ordered in ED Medications  levofloxacin  (LEVAQUIN) IVPB 750 mg (750 mg Intravenous New Bag/Given 09/20/17 2148)  0.9 %  sodium chloride infusion (250 mLs Intravenous New Bag/Given 09/20/17 2148)  sodium chloride 0.9 % bolus 1,000 mL (0 mLs Intravenous Stopped 09/20/17 2149)     Initial Impression / Assessment and Plan / ED Course  I have reviewed the triage vital signs and the nursing notes.  Pertinent labs & imaging results that were available during my care of the patient were reviewed by me and considered in my medical decision making (see chart for details).  Clinical Course as of Sep 20 2205  Thu Sep 20, 2017  2144 Labs confirmed that the patient does not fact have a ongoing urinary infection which must be assumed to be resistant to the cephalosporins, a fluoroquinolone was given as Levaquin IV, the patient's renal function is preserved, blood counts show no leukocytosis, urinalysis with ongoing urine infection, sensitivities are not back.  Patient likely needs to be admitted overnight.   [BM]    Clinical Course User Index [BM] Noemi Chapel, MD   It is unclear exactly what is causing the patient's symptoms however he has had some progressive generalized weakness and with subjective fevers I suspect that he is in need of a different antibiotic for his UTI.  Urine sample sent to the lab, other labs have been drawn, we will also check a troponin as he does have a history of coronary disease.  Labs are overall unremarkable however the patient does have an ongoing urinary infection,  Levaquin given,  Discussed with the hospitalist who will admit to the hospital overnight because of weakness for treatment pending sensitivity reports  Final Clinical Impressions(s) / ED Diagnoses   Final diagnoses:  Cystitis  Weakness    ED Discharge Orders    None       Noemi Chapel, MD 09/20/17 2207

## 2017-09-20 NOTE — ED Notes (Signed)
ED Provider at bedside. 

## 2017-09-21 DIAGNOSIS — Z7982 Long term (current) use of aspirin: Secondary | ICD-10-CM | POA: Diagnosis not present

## 2017-09-21 DIAGNOSIS — R531 Weakness: Secondary | ICD-10-CM

## 2017-09-21 DIAGNOSIS — I10 Essential (primary) hypertension: Secondary | ICD-10-CM | POA: Diagnosis present

## 2017-09-21 DIAGNOSIS — I2510 Atherosclerotic heart disease of native coronary artery without angina pectoris: Secondary | ICD-10-CM | POA: Diagnosis present

## 2017-09-21 DIAGNOSIS — E785 Hyperlipidemia, unspecified: Secondary | ICD-10-CM

## 2017-09-21 DIAGNOSIS — E1169 Type 2 diabetes mellitus with other specified complication: Secondary | ICD-10-CM | POA: Diagnosis not present

## 2017-09-21 DIAGNOSIS — R339 Retention of urine, unspecified: Secondary | ICD-10-CM | POA: Diagnosis present

## 2017-09-21 DIAGNOSIS — N390 Urinary tract infection, site not specified: Principal | ICD-10-CM

## 2017-09-21 DIAGNOSIS — E1165 Type 2 diabetes mellitus with hyperglycemia: Secondary | ICD-10-CM | POA: Diagnosis present

## 2017-09-21 DIAGNOSIS — E039 Hypothyroidism, unspecified: Secondary | ICD-10-CM

## 2017-09-21 DIAGNOSIS — D649 Anemia, unspecified: Secondary | ICD-10-CM | POA: Diagnosis present

## 2017-09-21 DIAGNOSIS — E871 Hypo-osmolality and hyponatremia: Secondary | ICD-10-CM | POA: Diagnosis present

## 2017-09-21 DIAGNOSIS — E860 Dehydration: Secondary | ICD-10-CM

## 2017-09-21 DIAGNOSIS — B9620 Unspecified Escherichia coli [E. coli] as the cause of diseases classified elsewhere: Secondary | ICD-10-CM | POA: Diagnosis present

## 2017-09-21 DIAGNOSIS — Z79890 Hormone replacement therapy (postmenopausal): Secondary | ICD-10-CM | POA: Diagnosis not present

## 2017-09-21 DIAGNOSIS — Z79899 Other long term (current) drug therapy: Secondary | ICD-10-CM | POA: Diagnosis not present

## 2017-09-21 DIAGNOSIS — Z833 Family history of diabetes mellitus: Secondary | ICD-10-CM | POA: Diagnosis not present

## 2017-09-21 DIAGNOSIS — Z794 Long term (current) use of insulin: Secondary | ICD-10-CM | POA: Diagnosis not present

## 2017-09-21 DIAGNOSIS — E7800 Pure hypercholesterolemia, unspecified: Secondary | ICD-10-CM | POA: Diagnosis present

## 2017-09-21 DIAGNOSIS — E1151 Type 2 diabetes mellitus with diabetic peripheral angiopathy without gangrene: Secondary | ICD-10-CM | POA: Diagnosis present

## 2017-09-21 DIAGNOSIS — I739 Peripheral vascular disease, unspecified: Secondary | ICD-10-CM

## 2017-09-21 HISTORY — DX: Dehydration: E86.0

## 2017-09-21 LAB — BASIC METABOLIC PANEL
ANION GAP: 6 (ref 5–15)
BUN: 13 mg/dL (ref 8–23)
CALCIUM: 8.5 mg/dL — AB (ref 8.9–10.3)
CO2: 25 mmol/L (ref 22–32)
Chloride: 106 mmol/L (ref 98–111)
Creatinine, Ser: 1.09 mg/dL (ref 0.61–1.24)
GFR calc non Af Amer: >60 mL/min (ref >60)
GLUCOSE: 165 mg/dL — AB (ref 70–99)
Potassium: 3.8 mmol/L (ref 3.5–5.1)
SODIUM: 137 mmol/L (ref 135–145)

## 2017-09-21 LAB — CBC
HCT: 32.5 % — ABNORMAL LOW (ref 39.0–52.0)
HEMOGLOBIN: 10.6 g/dL — AB (ref 13.0–17.0)
MCH: 29.4 pg (ref 26.0–34.0)
MCHC: 32.6 g/dL (ref 30.0–36.0)
MCV: 90.3 fL (ref 78.0–100.0)
Platelets: 232 10*3/uL (ref 150–400)
RBC: 3.6 MIL/uL — AB (ref 4.22–5.81)
RDW: 13.8 % (ref 11.5–15.5)
WBC: 6.2 10*3/uL (ref 4.0–10.5)

## 2017-09-21 LAB — URINE CULTURE: Culture: >=100000 — AB

## 2017-09-21 LAB — GLUCOSE, CAPILLARY
GLUCOSE-CAPILLARY: 137 mg/dL — AB (ref 70–99)
Glucose-Capillary: 121 mg/dL — ABNORMAL HIGH (ref 70–99)
Glucose-Capillary: 183 mg/dL — ABNORMAL HIGH (ref 70–99)

## 2017-09-21 LAB — IRON AND TIBC
IRON: 15 ug/dL — AB (ref 45–182)
SATURATION RATIOS: 7 % — AB (ref 17.9–39.5)
TIBC: 202 ug/dL — ABNORMAL LOW (ref 250–450)
UIBC: 187 ug/dL

## 2017-09-21 LAB — FOLATE: FOLATE: 13.1 ng/mL (ref >5.9)

## 2017-09-21 LAB — VITAMIN B12: VITAMIN B 12: 92 pg/mL — AB (ref 180–914)

## 2017-09-21 LAB — FERRITIN: Ferritin: 123 ng/mL (ref 24–336)

## 2017-09-21 MED ORDER — SODIUM CHLORIDE 0.9 % IV SOLN
1.0000 g | INTRAVENOUS | Status: DC
  Administered 2017-09-21: 1 g via INTRAVENOUS
  Filled 2017-09-21: qty 10
  Filled 2017-09-21: qty 1
  Filled 2017-09-21 (×2): qty 10

## 2017-09-21 MED ORDER — ENSURE ENLIVE PO LIQD
237.0000 mL | Freq: Two times a day (BID) | ORAL | Status: DC
  Administered 2017-09-21: 237 mL via ORAL

## 2017-09-21 MED ORDER — CIPROFLOXACIN HCL 500 MG PO TABS: 500.0000 mg | ORAL_TABLET | Freq: Two times a day (BID) | ORAL | 0 refills | Status: AC

## 2017-09-21 NOTE — Discharge Instructions (Signed)
Urinary Tract Infection, Adult A urinary tract infection (UTI) is an infection of any part of the urinary tract. The urinary tract includes the:  Kidneys.  Ureters.  Bladder.  Urethra.  These organs make, store, and get rid of pee (urine) in the body. Follow these instructions at home:  Take over-the-counter and prescription medicines only as told by your doctor.  If you were prescribed an antibiotic medicine, take it as told by your doctor. Do not stop taking the antibiotic even if you start to feel better.  Avoid the following drinks: ? Alcohol. ? Caffeine. ? Tea. ? Carbonated drinks.  Drink enough fluid to keep your pee clear or pale yellow.  Keep all follow-up visits as told by your doctor. This is important.  Make sure to: ? Empty your bladder often and completely. Do not to hold pee for long periods of time. ? Empty your bladder before and after sex. ? Wipe from front to back after a bowel movement if you are male. Use each tissue one time when you wipe. Contact a doctor if:  You have back pain.  You have a fever.  You feel sick to your stomach (nauseous).  You throw up (vomit).  Your symptoms do not get better after 3 days.  Your symptoms go away and then come back. Get help right away if:  You have very bad back pain.  You have very bad lower belly (abdominal) pain.  You are throwing up and cannot keep down any medicines or water. This information is not intended to replace advice given to you by your health care provider. Make sure you discuss any questions you have with your health care provider. Document Released: 06/14/2007 Document Revised: 06/03/2015 Document Reviewed: 11/16/2014 Elsevier Interactive Patient Education  2018 [employer].   Follow with Primary MD  Iona Beard, MD  and other consultant's as instructed your Hospitalist MD  Please get a complete blood count and chemistry panel checked by your Primary MD at your next visit, and  again as instructed by your Primary MD.  Get Medicines reviewed and adjusted: Please take all your medications with you for your next visit with your Primary MD  Laboratory/radiological data: Please request your Primary MD to go over all hospital tests and procedure/radiological results at the follow up, please ask your Primary MD to get all Hospital records sent to his/her office.  In some cases, they will be blood work, cultures and biopsy results pending at the time of your discharge. Please request that your primary care M.D. follows up on these results.  Also Note the following: If you experience worsening of your admission symptoms, develop shortness of breath, life threatening emergency, suicidal or homicidal thoughts you must seek medical attention immediately by calling 911 or calling your MD immediately  if symptoms less severe.  You must read complete instructions/literature along with all the possible adverse reactions/side effects for all the Medicines you take and that have been prescribed to you. Take any new Medicines after you have completely understood and accpet all the possible adverse reactions/side effects.   Do not drive when taking Pain medications or sleeping medications (Benzodaizepines)  Do not take more than prescribed Pain, Sleep and Anxiety Medications. It is not advisable to combine anxiety,sleep and pain medications without talking with your primary care practitioner  Special Instructions: If you have smoked or chewed Tobacco  in the last 2 yrs please stop smoking, stop any regular Alcohol  and or any Recreational drug  use.  Wear Seat belts while driving.  Please note: You were cared for by a hospitalist during your hospital stay. Once you are discharged, your primary care physician will handle any further medical issues. Please note that NO REFILLS for any discharge medications will be authorized once you are discharged, as it is imperative that you return to  your primary care physician (or establish a relationship with a primary care physician if you do not have one) for your post hospital discharge needs so that they can reassess your need for medications and monitor your lab values.      Weakness Weakness is a lack of strength. You may feel weak all over your body (generalized), or you may feel weak in one specific part of your body (focal). Common causes of weakness include:  Infection and immune system disorders.  Physical exhaustion.  Internal bleeding or other blood loss that results in a lack of red blood cells (anemia).  Dehydration.  An imbalance in mineral (electrolyte) levels, such as potassium.  Heart disease, circulation problems, or stroke.  Other causes include:  Some medicines or cancer treatment.  Stress, anxiety, or depression.  Nervous system disorders.  Thyroid disorders.  Loss of muscle strength because of age or inactivity.  Poor sleep quality or sleep disorders.  The cause of your weakness may not be known. Some causes of weakness can be serious, so it is important to see your health care provider. Follow these instructions at home:  Rest as needed.  Try to get enough sleep. Talk to your health care provider about how much sleep you need each night.  Take over-the-counter and prescription medicines only as told by your health care provider.  Eat a healthy, well-balanced diet. This includes: ? Proteins to build muscles, such as lean meats and fish. ? Fresh fruits and vegetables. ? Carbohydrates to boost energy, such as whole grains.  Drink enough fluid to keep your urine clear or pale yellow.  Do strength exercises, such as arm curls and leg raises, for 30 minutes at least 2 days a week or as told by your health care provider.  Consider working with a physical therapist or trainer who can develop an exercise plan to help you gain muscle strength.  Keep all follow-up visits as told by your  health care provider. This is important. Contact a health care provider if:  Your weakness does not improve or gets worse.  Your weakness affects your ability to think clearly.  Your weakness affects your ability to do your normal daily activities. Get help right away if:  You develop sudden weakness.  You have trouble breathing or shortness of breath.  You have problems with your vision.  You have trouble talking or swallowing.  You have trouble standing or walking.  You have chest pain.  You are light-headed or lose consciousness. This information is not intended to replace advice given to you by your health care provider. Make sure you discuss any questions you have with your health care provider. Document Released: 12/26/2004 Document Revised: 01/21/2015 Document Reviewed: 10/16/2014 Elsevier Interactive Patient Education  2018 [place] in the Home Falls can cause injuries. They can happen to people of all ages. There are many things you can do to make your home safe and to help prevent falls. What can I do on the outside of my home?  Regularly fix the edges of walkways and driveways and fix any cracks.  Remove anything that  might make you trip as you walk through a door, such as a raised step or threshold.  Trim any bushes or trees on the path to your home.  Use bright outdoor lighting.  Clear any walking paths of anything that might make someone trip, such as rocks or tools.  Regularly check to see if handrails are loose or broken. Make sure that both sides of any steps have handrails.  Any raised decks and porches should have guardrails on the edges.  Have any leaves, snow, or ice cleared regularly.  Use sand or salt on walking paths during winter.  Clean up any spills in your garage right away. This includes oil or grease spills. What can I do in the bathroom?  Use night lights.  Install grab bars by the toilet and in the tub  and shower. Do not use towel bars as grab bars.  Use non-skid mats or decals in the tub or shower.  If you need to sit down in the shower, use a plastic, non-slip stool.  Keep the floor dry. Clean up any water that spills on the floor as soon as it happens.  Remove soap buildup in the tub or shower regularly.  Attach bath mats securely with double-sided non-slip rug tape.  Do not have throw rugs and other things on the floor that can make you trip. What can I do in the bedroom?  Use night lights.  Make sure that you have a light by your bed that is easy to reach.  Do not use any sheets or blankets that are too big for your bed. They should not hang down onto the floor.  Have a firm chair that has side arms. You can use this for support while you get dressed.  Do not have throw rugs and other things on the floor that can make you trip. What can I do in the kitchen?  Clean up any spills right away.  Avoid walking on wet floors.  Keep items that you use a lot in easy-to-reach places.  If you need to reach something above you, use a strong step stool that has a grab bar.  Keep electrical cords out of the way.  Do not use floor polish or wax that makes floors slippery. If you must use wax, use non-skid floor wax.  Do not have throw rugs and other things on the floor that can make you trip. What can I do with my stairs?  Do not leave any items on the stairs.  Make sure that there are handrails on both sides of the stairs and use them. Fix handrails that are broken or loose. Make sure that handrails are as long as the stairways.  Check any carpeting to make sure that it is firmly attached to the stairs. Fix any carpet that is loose or worn.  Avoid having throw rugs at the top or bottom of the stairs. If you do have throw rugs, attach them to the floor with carpet tape.  Make sure that you have a light switch at the top of the stairs and the bottom of the stairs. If you do  not have them, ask someone to add them for you. What else can I do to help prevent falls?  Wear shoes that: ? Do not have high heels. ? Have rubber bottoms. ? Are comfortable and fit you well. ? Are closed at the toe. Do not wear sandals.  If you use a stepladder: ? Make sure that  it is fully opened. Do not climb a closed stepladder. ? Make sure that both sides of the stepladder are locked into place. ? Ask someone to hold it for you, if possible.  Clearly mark and make sure that you can see: ? Any grab bars or handrails. ? First and last steps. ? Where the edge of each step is.  Use tools that help you move around (mobility aids) if they are needed. These include: ? Canes. ? Walkers. ? Scooters. ? Crutches.  Turn on the lights when you go into a dark area. Replace any light bulbs as soon as they burn out.  Set up your furniture so you have a clear path. Avoid moving your furniture around.  If any of your floors are uneven, fix them.  If there are any pets around you, be aware of where they are.  Review your medicines with your doctor. Some medicines can make you feel dizzy. This can increase your chance of falling. Ask your doctor what other things that you can do to help prevent falls. This information is not intended to replace advice given to you by your health care provider. Make sure you discuss any questions you have with your health care provider. Document Released: 10/22/2008 Document Revised: 06/03/2015 Document Reviewed: 01/30/2014 Elsevier Interactive Patient Education  [employer].

## 2017-09-21 NOTE — Discharge Summary (Signed)
Physician Discharge Summary  Dylan Robles EVO:[id number] DOB: Aug 05, 1940 DOA: 09/20/2017  PCP: Iona Beard, MD  Admit date: 09/20/2017 Discharge date: 09/21/2017  Admitted From: Home Disposition: Home Recommendations for Outpatient Follow-up:  1. Follow up with PCP in 1 weeks 2. Please follow up final cultures.   Discharge Condition: STABLE   CODE STATUS: FULL    Brief Hospitalization Summary: Please see all hospital notes, images, labs for full details of the hospitalization.  HPI: Dylan Robles is a 77 y.o. male with medical history significant for CAD, PVD, type 2 diabetes, dyslipidemia, hypertension, and hypothyroidism who presented to the emergency department with worsening generalized weakness as well as some fever at home.  Patient recently had placement of a urinary catheter due to urinary retention approximately 1 week ago and was noted to have a UTI 2 days ago when a Foley catheter had to be replaced after recent removal by his urologist in [place].  His urine culture was noted to grow E. coli with sensitivities still pending.  He was empirically given cephalexin and had been taking his medications as prescribed over the last 2 days.  Unfortunately, it appears that he is symptomatically worsening and has had poor appetite and low-grade subjective fevers at home.  Wife at the bedside states that she cannot even assist her husband with ambulation on account of the weakness. Patient denies any chills, rigors, discharge, hematuria, abdominal pain, nausea, vomiting, chest pain, or shortness of breath.   ED Course: Vital signs are stable and patient is currently afebrile with temperature of 99.8 Fahrenheit.  His hemoglobin is noted to be 10.8 and was previously 14 on 9/4.  Sodium is 132 and glucose is 196.  Lactic acid is 1.7 and creatinine is stable at 1.17.  Troponin is 0.03.  Urine sensitivities are currently pending.  He has been started on Levaquin as well as some IV fluid in the  ED.  The patient responded very well to supportive care.  He was given IV fluids and started on IV antibiotics with good improvement.  The patient felt much better.  His urine culture tested positive for pansensitive E. coli.  He has been discharged on a course of oral ciprofloxacin for an additional 7 days treatment.  The E. coli is highly sensitive to ciprofloxacin.  The patient was given instructions to follow-up with his primary care physician for 1 week.  The patient was given instructions to seek medical care or return to ER if symptoms worsen, do not improve or new problems develop.  The patient verbalized understanding.  The importance of taking his antibiotics to completion was emphasized and the patient verbalized understanding.  Discharge Diagnoses:  Principal Problem:   Acute lower UTI Active Problems:   Hypothyroidism   Type 2 diabetes mellitus with hyperlipidemia (HCC)   Essential hypertension   PERIPHERAL VASCULAR DISEASE   CAD (coronary artery disease)   Weakness   Dehydration  Discharge Instructions: Discharge Instructions    Call MD for:  difficulty breathing, headache or visual disturbances   Complete by:  As directed    Call MD for:  persistant dizziness or light-headedness   Complete by:  As directed    Call MD for:  persistant nausea and vomiting   Complete by:  As directed    Call MD for:  severe uncontrolled pain   Complete by:  As directed    Increase activity slowly   Complete by:  As directed      Allergies as  of 09/21/2017      Reactions   Plavix [clopidogrel] Hives      Medication List    STOP taking these medications   cephALEXin 500 MG capsule Commonly known as:  KEFLEX     TAKE these medications   amLODipine 10 MG tablet Commonly known as:  NORVASC Take 1 tablet (10 mg total) by mouth daily.   aspirin EC 81 MG tablet Take 81 mg by mouth daily.   atorvastatin 80 MG tablet Commonly known as:  LIPITOR Take 80 mg by mouth at bedtime.    benazepril 20 MG tablet Commonly known as:  LOTENSIN Take 2 tablets (40 mg total) by mouth daily.   ciprofloxacin 500 MG tablet Commonly known as:  CIPRO Take 1 tablet (500 mg total) by mouth 2 (two) times daily for 7 days. Start taking on:  09/22/2017   gabapentin 100 MG capsule Commonly known as:  NEURONTIN Take 200 mg by mouth at bedtime.   insulin aspart 100 UNIT/ML injection Commonly known as:  novoLOG Inject 5 Units into the skin 3 (three) times daily before meals.   insulin detemir 100 UNIT/ML injection Commonly known as:  LEVEMIR Inject 24 Units into the skin at bedtime.   isosorbide mononitrate 30 MG 24 hr tablet Commonly known as:  IMDUR Take 30 mg by mouth daily.   levothyroxine 88 MCG tablet Commonly known as:  SYNTHROID, LEVOTHROID Take 1 tablet (88 mcg total) by mouth daily.   metFORMIN 1000 MG tablet Commonly known as:  GLUCOPHAGE Take 1 tablet (1,000 mg total) by mouth 2 (two) times daily.   metoprolol succinate 25 MG 24 hr tablet Commonly known as:  TOPROL-XL Take 25 mg by mouth daily.   tamsulosin 0.4 MG Caps capsule Commonly known as:  FLOMAX Take 0.4 mg by mouth at bedtime.   ticagrelor 90 MG Tabs tablet Commonly known as:  BRILINTA Take 90 mg by mouth 2 (two) times daily.      Follow-up Information    Iona Beard, MD. Schedule an appointment as soon as possible for a visit in 1 week(s).   Specialty:  Family Medicine Why:  Hospital Follow Up  Contact information: [address] [place] [address] [phone]          Allergies  Allergen Reactions  . Plavix [Clopidogrel] Hives   Allergies as of 09/21/2017      Reactions   Plavix [clopidogrel] Hives      Medication List    STOP taking these medications   cephALEXin 500 MG capsule Commonly known as:  KEFLEX     TAKE these medications   amLODipine 10 MG tablet Commonly known as:  NORVASC Take 1 tablet (10 mg total) by mouth daily.   aspirin EC 81 MG  tablet Take 81 mg by mouth daily.   atorvastatin 80 MG tablet Commonly known as:  LIPITOR Take 80 mg by mouth at bedtime.   benazepril 20 MG tablet Commonly known as:  LOTENSIN Take 2 tablets (40 mg total) by mouth daily.   ciprofloxacin 500 MG tablet Commonly known as:  CIPRO Take 1 tablet (500 mg total) by mouth 2 (two) times daily for 7 days. Start taking on:  09/22/2017   gabapentin 100 MG capsule Commonly known as:  NEURONTIN Take 200 mg by mouth at bedtime.   insulin aspart 100 UNIT/ML injection Commonly known as:  novoLOG Inject 5 Units into the skin 3 (three) times daily before meals.   insulin detemir  100 UNIT/ML injection Commonly known as:  LEVEMIR Inject 24 Units into the skin at bedtime.   isosorbide mononitrate 30 MG 24 hr tablet Commonly known as:  IMDUR Take 30 mg by mouth daily.   levothyroxine 88 MCG tablet Commonly known as:  SYNTHROID, LEVOTHROID Take 1 tablet (88 mcg total) by mouth daily.   metFORMIN 1000 MG tablet Commonly known as:  GLUCOPHAGE Take 1 tablet (1,000 mg total) by mouth 2 (two) times daily.   metoprolol succinate 25 MG 24 hr tablet Commonly known as:  TOPROL-XL Take 25 mg by mouth daily.   tamsulosin 0.4 MG Caps capsule Commonly known as:  FLOMAX Take 0.4 mg by mouth at bedtime.   ticagrelor 90 MG Tabs tablet Commonly known as:  BRILINTA Take 90 mg by mouth 2 (two) times daily.       Procedures/Studies: Ct Renal Stone Study  Result Date: 09/12/2017 CLINICAL DATA:  Dysuria EXAM: CT ABDOMEN AND PELVIS WITHOUT CONTRAST TECHNIQUE: Multidetector CT imaging of the abdomen and pelvis was performed following the standard protocol without IV contrast. COMPARISON:  None. FINDINGS: Lower chest: Lung bases demonstrate no acute consolidation or effusion. The heart size is within normal limits. Coronary vascular calcification Hepatobiliary: No focal hepatic abnormality or biliary dilatation. Small calcifications in the right upper  quadrant may reflect small stones at the gallbladder neck. No gallbladder wall inflammation. Pancreas: No inflammatory change. Diffusely atrophic. Possible 9 mm low-density cystic lesion at the uncinate process. Spleen: Normal in size without focal abnormality. Adrenals/Urinary Tract: Adrenal glands are within normal limits. Moderate perinephric fat stranding. Mild bilateral hydronephrosis and hydroureter. Probable cysts within the left kidney. No ureteral stone. Bladder distention. Stomach/Bowel: The stomach is nonenlarged. No dilated small bowel. No colon wall thickening. Vascular/Lymphatic: Moderate to marked aortic atherosclerosis. No aneurysm. No significantly enlarged lymph nodes. Reproductive: Enlarged prostate gland with calcification. Mass effect on the posterior bladder Other: No free air or free fluid. Fat within the left inguinal canal. Musculoskeletal: Degenerative changes. No fracture or malalignment. 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 This measures about 6.6 cm transverse. IMPRESSION: 1. Moderate perinephric edema bilaterally with mild hydronephrosis and hydroureter but no ureteral stone. Moderate to marked bladder distension, question outlet obstruction. There is slight enlargement of the prostate gland. 2. Possible small stones at the neck of the gallbladder 3. Possible 9 mm low-density cystic lesion at the uncinate process of the pancreas. When the patient is clinically stable and able to follow directions and hold their breath (preferably as an outpatient) further evaluation with dedicated abdominal MRI should be considered. Electronically Signed   By: Donavan Foil M.D.   On: 09/12/2017 15:14      Subjective: The patient says he feels much better.  He has no specific complaints at this time.  Discharge Exam: Vitals:   09/21/17 0012 09/21/17 0643  BP: (!) 158/81 (!) 169/88  Pulse: 80 81  Resp: 20 18  Temp: 98.2 F (36.8 C) 99.7 F (37.6 C)   SpO2: 99% 100%   Vitals:   09/20/17 2230 09/20/17 2300 09/21/17 0012 09/21/17 0643  BP: (!) 150/81 (!) 143/81 (!) 158/81 (!) 169/88  Pulse: 83 77 80 81  Resp: 16 20 20 18   Temp:   98.2 F (36.8 C) 99.7 F (37.6 C)  TempSrc:   Oral Oral  SpO2:   99% 100%  Weight:      Height:       General: Pt is alert, awake, not  in acute distress Cardiovascular: normal S1/S2 +, no rubs, no gallops Respiratory: CTA bilaterally, no wheezing, no rhonchi Abdominal: Soft, NT, ND, bowel sounds + GU: foley with yellow urine seen.  Extremities: no edema, no cyanosis   The results of significant diagnostics from this hospitalization (including imaging, microbiology, ancillary and laboratory) are listed below for reference.     Microbiology: Recent Results (from the past 240 hour(s))  Urine C&S     Status: Abnormal   Collection Time: 09/12/17 11:25 AM  Result Value Ref Range Status   Specimen Description   Final    URINE, CLEAN CATCH Performed at [facility], [address] [place], [place] [address]    Special Requests   Final    Normal Performed at [facility], [address], [place], [place] [address]    Culture (A)  Final    <10,000 COLONIES/mL INSIGNIFICANT GROWTH Performed at [place] [address], [place], [place] [address]    Report Status 09/13/2017 FINAL  Final  Urine culture     Status: Abnormal   Collection Time: 09/18/17  8:30 PM  Result Value Ref Range Status   Specimen Description   Final    URINE, RANDOM Performed at [facility], [address], [place], [place] [address]    Special Requests   Final    NONE Performed at [facility], [address], [place], [place] [address]    Culture >=100,000 COLONIES/mL ESCHERICHIA COLI (A)  Final   Report Status 09/21/2017 FINAL  Final   Organism ID, Bacteria ESCHERICHIA COLI (A)  Final      Susceptibility   Escherichia coli - MIC*    AMPICILLIN <=2 SENSITIVE Sensitive     CEFAZOLIN <=4 SENSITIVE Sensitive      CEFTRIAXONE <=1 SENSITIVE Sensitive     CIPROFLOXACIN <=0.25 SENSITIVE Sensitive     GENTAMICIN <=1 SENSITIVE Sensitive     IMIPENEM <=0.25 SENSITIVE Sensitive     NITROFURANTOIN <=16 SENSITIVE Sensitive     TRIMETH/SULFA <=20 SENSITIVE Sensitive     AMPICILLIN/SULBACTAM <=2 SENSITIVE Sensitive     PIP/TAZO <=4 SENSITIVE Sensitive     Extended ESBL NEGATIVE Sensitive     * >=100,000 COLONIES/mL ESCHERICHIA COLI     Labs: BNP (last 3 results) No results for input(s): BNP in the last 8760 hours. Basic Metabolic Panel: Recent Labs  Lab 09/18/17 2131 09/20/17 2000 09/21/17 0535  NA 136 132* 137  K 3.8 4.0 3.8  CL 100 98 106  CO2 24 26 25   GLUCOSE 111* 196* 165*  BUN 17 15 13   CREATININE 1.07 1.17 1.09  CALCIUM 9.4 8.6* 8.5*   Liver Function Tests: No results for input(s): AST, ALT, ALKPHOS, BILITOT, PROT, ALBUMIN in the last 168 hours. No results for input(s): LIPASE, AMYLASE in the last 168 hours. No results for input(s): AMMONIA in the last 168 hours. CBC: Recent Labs  Lab 09/18/17 2131 09/20/17 2000 09/21/17 0535  WBC 11.1* 8.0 6.2  NEUTROABS 9.5* 6.5  --   HGB 11.5* 10.8* 10.6*  HCT 36.0* 33.0* 32.5*  MCV 92.3 90.9 90.3  PLT 247 250 232   Cardiac Enzymes: Recent Labs  Lab 09/20/17 2000  TROPONINI 0.03*   BNP: Invalid input(s): POCBNP CBG: Recent Labs  Lab 09/21/17 0011 09/21/17 0758  GLUCAP 183* 121*   D-Dimer No results for input(s): DDIMER in the last 72 hours. Hgb A1c No results for input(s): HGBA1C in the last 72 hours. Lipid Profile No results for  input(s): CHOL, HDL, LDLCALC, TRIG, CHOLHDL, LDLDIRECT in the last 72 hours. Thyroid function studies Recent Labs    09/20/17 2000  TSH 1.039   Anemia work up Recent Labs    09/20/17 2000 09/20/17 2324  VITAMINB12  --  92*  FOLATE  --  13.1  FERRITIN  --  123  TIBC  --  202*  IRON  --  15*  RETICCTPCT 0.9  --    Urinalysis    Component Value Date/Time   COLORURINE AMBER (A)  09/20/2017 1917   APPEARANCEUR CLOUDY (A) 09/20/2017 1917   LABSPEC 1.023 09/20/2017 1917   PHURINE 5.0 09/20/2017 1917   GLUCOSEU 50 (A) 09/20/2017 1917   HGBUR LARGE (A) 09/20/2017 1917   BILIRUBINUR NEGATIVE 09/20/2017 1917   KETONESUR NEGATIVE 09/20/2017 1917   PROTEINUR 100 (A) 09/20/2017 1917   UROBILINOGEN 0.2 07/28/2011 1351   NITRITE NEGATIVE 09/20/2017 1917   LEUKOCYTESUR MODERATE (A) 09/20/2017 1917   Sepsis Labs Invalid input(s): PROCALCITONIN,  WBC,  LACTICIDVEN Microbiology Recent Results (from the past 240 hour(s))  Urine C&S     Status: Abnormal   Collection Time: 09/12/17 11:25 AM  Result Value Ref Range Status   Specimen Description   Final    URINE, CLEAN CATCH Performed at [facility], [address], [place], [place] [address]    Special Requests   Final    Normal Performed at [facility], [address] [place], [place] [address]    Culture (A)  Final    <10,000 COLONIES/mL INSIGNIFICANT GROWTH Performed at [facility], [place] [address], [place], [place] [address]    Report Status 09/13/2017 FINAL  Final  Urine culture     Status: Abnormal   Collection Time: 09/18/17  8:30 PM  Result Value Ref Range Status   Specimen Description   Final    URINE, RANDOM Performed at [facility], [address] [place], [place] [address]    Special Requests   Final    NONE Performed at [facility], [address], [place], [place] [address]    Culture >=100,000 COLONIES/mL ESCHERICHIA COLI (A)  Final   Report Status 09/21/2017 FINAL  Final   Organism ID, Bacteria ESCHERICHIA COLI (A)  Final      Susceptibility   Escherichia coli - MIC*    AMPICILLIN <=2 SENSITIVE Sensitive     CEFAZOLIN <=4 SENSITIVE Sensitive     CEFTRIAXONE <=1 SENSITIVE Sensitive     CIPROFLOXACIN <=0.25 SENSITIVE Sensitive     GENTAMICIN <=1 SENSITIVE Sensitive     IMIPENEM <=0.25 SENSITIVE Sensitive     NITROFURANTOIN <=16 SENSITIVE Sensitive     TRIMETH/SULFA <=20 SENSITIVE  Sensitive     AMPICILLIN/SULBACTAM <=2 SENSITIVE Sensitive     PIP/TAZO <=4 SENSITIVE Sensitive     Extended ESBL NEGATIVE Sensitive     * >=100,000 COLONIES/mL ESCHERICHIA COLI    Time coordinating discharge:   SIGNED:  Irwin Brakeman, MD  [facility] 09/21/2017, 11:29 AM Pager [phone]  If 7PM-7AM, please contact night-coverage [email or web address] Password [email or web address]

## 2017-09-21 NOTE — Progress Notes (Signed)
Discharge instructions reviewed with patient and his wife.  Both verbalized understanding of instructions.  Patient discharged home with wife in stable condition.

## 2017-09-21 NOTE — Care Management Note (Signed)
Case Management Note  Patient Details  Name: Dylan Robles MRN: [id number] Date of Birth: Jun 15, 1940  Subjective/Objective:      Admitted with UTI after failing OP abx. Pt from home, lives with wife, ind pta. Pt has insurance, PCP and transportation. Pt needs HH PT. He is agreeable, he has chosen [facility] from list of [facility] providers.  Aware HH has 48 hrs to make first visit.           Action/Plan: DC home today with [place], [facility] rep, given referral.   Expected Discharge Date:  09/21/17               Expected Discharge Plan:  [place]  In-House Referral:  NA  Discharge planning Services  CM Consult  Post Acute Care Choice:  Home Health Choice offered to:  Patient, Spouse  HH Arranged:  PT [place]:  [place]  Status of Service:  Completed, signed off  Sherald Barge, RN 09/21/2017, 12:25 PM

## 2017-09-21 NOTE — Evaluation (Signed)
Physical Therapy Evaluation Patient Details Name: Dylan Robles MRN: [id number] DOB: [birth year] Today's Date: 09/21/2017   History of Present Illness  Dylan Robles is a 77 y.o. male with medical history significant for CAD, PVD, type 2 diabetes, dyslipidemia, hypertension, and hypothyroidism who presented to the emergency department with worsening generalized weakness as well as some fever at home.  Patient recently had placement of a urinary catheter due to urinary retention approximately 1 week ago and was noted to have a UTI 2 days ago when a Foley catheter had to be replaced after recent removal by his urologist in [place].  His urine culture was noted to grow E. coli with sensitivities still pending.  He was empirically given cephalexin and had been taking his medications as prescribed over the last 2 days.  Unfortunately, it appears that he is symptomatically worsening and has had poor appetite and low-grade subjective fevers at home.  Wife at the bedside states that she cannot even assist her husband with ambulation on account of the weakness.    Clinical Impression  Patient functioning near baseline for functional mobility and gait other than demonstrating slightly labored movement when initially standing up without using an AD, improvement noted when using SPC and able to ambulate in hallway without loss of balance.  Plan:  Patient discharged from physical therapy to care of nursing for ambulation daily as tolerated for length of stay with recommendations below.     Follow Up Recommendations Home health PT;Supervision - Intermittent    Equipment Recommendations  None recommended by PT    Recommendations for Other Services       Precautions / Restrictions Precautions Precautions: None Restrictions Weight Bearing Restrictions: No      Mobility  Bed Mobility Overal bed mobility: Independent                Transfers Overall transfer level: Modified  independent Equipment used: Straight cane             General transfer comment: slightly labored movement  Ambulation/Gait Ambulation/Gait assistance: Supervision Gait Distance (Feet): 120 Feet Assistive device: Straight cane Gait Pattern/deviations: WFL(Within Functional Limits) Gait velocity: slightly decreased   General Gait Details: patient demonstrates good return for use of SPC with mostly 2 point gait pattern without loss of balance  Stairs            Wheelchair Mobility    Modified Rankin (Stroke Patients Only)       Balance Overall balance assessment: Mild deficits observed, not formally tested                                           Pertinent Vitals/Pain Pain Assessment: No/denies pain    Home Living Family/patient expects to be discharged to:: Private residence Living Arrangements: Spouse/significant other Available Help at Discharge: Family Type of Home: Mobile home Home Access: Stairs to enter Entrance Stairs-Rails: None Entrance Stairs-Number of Steps: 2 Home Layout: One level Home Equipment: Cane - single point      Prior Function Level of Independence: Independent with assistive device(s)         Comments: community ambulator with [facility]     Hand Dominance        Extremity/Trunk Assessment   Upper Extremity Assessment Upper Extremity Assessment: Overall WFL for tasks assessed    Lower Extremity Assessment Lower Extremity Assessment: Overall WFL for tasks  assessed    Cervical / Trunk Assessment Cervical / Trunk Assessment: Normal  Communication   Communication: No difficulties  Cognition Arousal/Alertness: Awake/alert Behavior During Therapy: WFL for tasks assessed/performed Overall Cognitive Status: Within Functional Limits for tasks assessed                                        General Comments      Exercises     Assessment/Plan    PT Assessment All further PT needs can be  met in the next venue of care  PT Problem List Decreased activity tolerance;Decreased mobility       PT Treatment Interventions      PT Goals (Current goals can be found in the Care Plan section)  Acute Rehab PT Goals Patient Stated Goal: return home with spouse to assist PT Goal Formulation: With patient/family Time For Goal Achievement: 09/21/17 Potential to Achieve Goals: Good    Frequency     Barriers to discharge        Co-evaluation               AM-PAC PT "6 Clicks" Daily Activity  Outcome Measure Difficulty turning over in bed (including adjusting bedclothes, sheets and blankets)?: None Difficulty moving from lying on back to sitting on the side of the bed? : None Difficulty sitting down on and standing up from a chair with arms (e.g., wheelchair, bedside commode, etc,.)?: None Help needed moving to and from a bed to chair (including a wheelchair)?: None Help needed walking in hospital room?: None Help needed climbing 3-5 steps with a railing? : A Little 6 Click Score: 23    End of Session Equipment Utilized During Treatment: Gait belt Activity Tolerance: Patient tolerated treatment well Patient left: in chair;with call bell/phone within reach;with family/visitor present Nurse Communication: Mobility status PT Visit Diagnosis: Unsteadiness on feet (R26.81);Other abnormalities of gait and mobility (R26.89);Muscle weakness (generalized) (M62.81)    Time: 0820-0850 PT Time Calculation (min) (ACUTE ONLY): 30 min   Charges:   PT Evaluation $PT Eval Moderate Complexity: 1 Mod PT Treatments $Therapeutic Activity: 23-37 mins        12:24 PM, 09/21/17 Lonell Grandchild, MPT Physical Therapist with [facility] 336 [phone] office [phone] mobile phone

## 2017-09-22 ENCOUNTER — Telehealth: Payer: Self-pay

## 2017-09-22 NOTE — Telephone Encounter (Signed)
Post ED Visit - Positive Culture Follow-up  Culture report reviewed by antimicrobial stewardship pharmacist:  []  Elenor Quinones, Pharm.D. []  Heide Guile, Pharm.D., BCPS AQ-ID [x]  Parks Neptune, Pharm.D., BCPS []  Alycia Rossetti, Pharm.D., BCPS []  [place], Pharm.D., BCPS, AAHIVP []  Legrand Como, Pharm.D., BCPS, AAHIVP []  Salome Arnt, PharmD, BCPS []  Johnnette Gourd, PharmD, BCPS []  Hughes Better, PharmD, BCPS []  Leeroy Cha, PharmD  Positive urine culture Treated with Cephalexin, organism sensitive to the same and no further patient follow-up is required at this time.  Genia Del 09/22/2017, 9:47 AM

## 2017-09-25 DIAGNOSIS — R338 Other retention of urine: Secondary | ICD-10-CM | POA: Diagnosis not present

## 2017-09-25 DIAGNOSIS — N401 Enlarged prostate with lower urinary tract symptoms: Secondary | ICD-10-CM | POA: Diagnosis not present

## 2017-09-26 DIAGNOSIS — I10 Essential (primary) hypertension: Secondary | ICD-10-CM | POA: Diagnosis not present

## 2017-09-26 DIAGNOSIS — E1165 Type 2 diabetes mellitus with hyperglycemia: Secondary | ICD-10-CM | POA: Diagnosis not present

## 2017-09-26 DIAGNOSIS — R339 Retention of urine, unspecified: Secondary | ICD-10-CM | POA: Diagnosis not present

## 2017-09-26 DIAGNOSIS — E1142 Type 2 diabetes mellitus with diabetic polyneuropathy: Secondary | ICD-10-CM | POA: Diagnosis not present

## 2017-09-26 DIAGNOSIS — E785 Hyperlipidemia, unspecified: Secondary | ICD-10-CM | POA: Diagnosis not present

## 2017-09-26 DIAGNOSIS — N3090 Cystitis, unspecified without hematuria: Secondary | ICD-10-CM | POA: Diagnosis not present

## 2017-09-26 DIAGNOSIS — E1169 Type 2 diabetes mellitus with other specified complication: Secondary | ICD-10-CM | POA: Diagnosis not present

## 2017-09-26 DIAGNOSIS — B9620 Unspecified Escherichia coli [E. coli] as the cause of diseases classified elsewhere: Secondary | ICD-10-CM | POA: Diagnosis not present

## 2017-09-26 DIAGNOSIS — I2510 Atherosclerotic heart disease of native coronary artery without angina pectoris: Secondary | ICD-10-CM | POA: Diagnosis not present

## 2017-09-28 DIAGNOSIS — E785 Hyperlipidemia, unspecified: Secondary | ICD-10-CM | POA: Diagnosis not present

## 2017-09-28 DIAGNOSIS — E1142 Type 2 diabetes mellitus with diabetic polyneuropathy: Secondary | ICD-10-CM | POA: Diagnosis not present

## 2017-09-28 DIAGNOSIS — N3090 Cystitis, unspecified without hematuria: Secondary | ICD-10-CM | POA: Diagnosis not present

## 2017-09-28 DIAGNOSIS — I2510 Atherosclerotic heart disease of native coronary artery without angina pectoris: Secondary | ICD-10-CM | POA: Diagnosis not present

## 2017-09-28 DIAGNOSIS — E1169 Type 2 diabetes mellitus with other specified complication: Secondary | ICD-10-CM | POA: Diagnosis not present

## 2017-09-28 DIAGNOSIS — E1165 Type 2 diabetes mellitus with hyperglycemia: Secondary | ICD-10-CM | POA: Diagnosis not present

## 2017-09-28 DIAGNOSIS — I10 Essential (primary) hypertension: Secondary | ICD-10-CM | POA: Diagnosis not present

## 2017-09-28 DIAGNOSIS — R339 Retention of urine, unspecified: Secondary | ICD-10-CM | POA: Diagnosis not present

## 2017-09-28 DIAGNOSIS — B9620 Unspecified Escherichia coli [E. coli] as the cause of diseases classified elsewhere: Secondary | ICD-10-CM | POA: Diagnosis not present

## 2017-10-01 DIAGNOSIS — R339 Retention of urine, unspecified: Secondary | ICD-10-CM | POA: Diagnosis not present

## 2017-10-01 DIAGNOSIS — N3090 Cystitis, unspecified without hematuria: Secondary | ICD-10-CM | POA: Diagnosis not present

## 2017-10-01 DIAGNOSIS — E1165 Type 2 diabetes mellitus with hyperglycemia: Secondary | ICD-10-CM | POA: Diagnosis not present

## 2017-10-01 DIAGNOSIS — B9620 Unspecified Escherichia coli [E. coli] as the cause of diseases classified elsewhere: Secondary | ICD-10-CM | POA: Diagnosis not present

## 2017-10-01 DIAGNOSIS — E1142 Type 2 diabetes mellitus with diabetic polyneuropathy: Secondary | ICD-10-CM | POA: Diagnosis not present

## 2017-10-01 DIAGNOSIS — I2510 Atherosclerotic heart disease of native coronary artery without angina pectoris: Secondary | ICD-10-CM | POA: Diagnosis not present

## 2017-10-01 DIAGNOSIS — I10 Essential (primary) hypertension: Secondary | ICD-10-CM | POA: Diagnosis not present

## 2017-10-01 DIAGNOSIS — E785 Hyperlipidemia, unspecified: Secondary | ICD-10-CM | POA: Diagnosis not present

## 2017-10-01 DIAGNOSIS — E1169 Type 2 diabetes mellitus with other specified complication: Secondary | ICD-10-CM | POA: Diagnosis not present

## 2017-10-03 DIAGNOSIS — E1169 Type 2 diabetes mellitus with other specified complication: Secondary | ICD-10-CM | POA: Diagnosis not present

## 2017-10-03 DIAGNOSIS — N3090 Cystitis, unspecified without hematuria: Secondary | ICD-10-CM | POA: Diagnosis not present

## 2017-10-03 DIAGNOSIS — R339 Retention of urine, unspecified: Secondary | ICD-10-CM | POA: Diagnosis not present

## 2017-10-03 DIAGNOSIS — B9620 Unspecified Escherichia coli [E. coli] as the cause of diseases classified elsewhere: Secondary | ICD-10-CM | POA: Diagnosis not present

## 2017-10-03 DIAGNOSIS — E1142 Type 2 diabetes mellitus with diabetic polyneuropathy: Secondary | ICD-10-CM | POA: Diagnosis not present

## 2017-10-03 DIAGNOSIS — I10 Essential (primary) hypertension: Secondary | ICD-10-CM | POA: Diagnosis not present

## 2017-10-03 DIAGNOSIS — I2510 Atherosclerotic heart disease of native coronary artery without angina pectoris: Secondary | ICD-10-CM | POA: Diagnosis not present

## 2017-10-03 DIAGNOSIS — E1165 Type 2 diabetes mellitus with hyperglycemia: Secondary | ICD-10-CM | POA: Diagnosis not present

## 2017-10-03 DIAGNOSIS — E785 Hyperlipidemia, unspecified: Secondary | ICD-10-CM | POA: Diagnosis not present

## 2017-10-05 DIAGNOSIS — I2510 Atherosclerotic heart disease of native coronary artery without angina pectoris: Secondary | ICD-10-CM | POA: Diagnosis not present

## 2017-10-05 DIAGNOSIS — N3090 Cystitis, unspecified without hematuria: Secondary | ICD-10-CM | POA: Diagnosis not present

## 2017-10-05 DIAGNOSIS — E1142 Type 2 diabetes mellitus with diabetic polyneuropathy: Secondary | ICD-10-CM | POA: Diagnosis not present

## 2017-10-05 DIAGNOSIS — E785 Hyperlipidemia, unspecified: Secondary | ICD-10-CM | POA: Diagnosis not present

## 2017-10-05 DIAGNOSIS — E1165 Type 2 diabetes mellitus with hyperglycemia: Secondary | ICD-10-CM | POA: Diagnosis not present

## 2017-10-05 DIAGNOSIS — E1169 Type 2 diabetes mellitus with other specified complication: Secondary | ICD-10-CM | POA: Diagnosis not present

## 2017-10-05 DIAGNOSIS — B9620 Unspecified Escherichia coli [E. coli] as the cause of diseases classified elsewhere: Secondary | ICD-10-CM | POA: Diagnosis not present

## 2017-10-05 DIAGNOSIS — R339 Retention of urine, unspecified: Secondary | ICD-10-CM | POA: Diagnosis not present

## 2017-10-05 DIAGNOSIS — I10 Essential (primary) hypertension: Secondary | ICD-10-CM | POA: Diagnosis not present

## 2017-10-09 DIAGNOSIS — E118 Type 2 diabetes mellitus with unspecified complications: Secondary | ICD-10-CM | POA: Diagnosis not present

## 2017-10-09 DIAGNOSIS — I872 Venous insufficiency (chronic) (peripheral): Secondary | ICD-10-CM | POA: Diagnosis not present

## 2017-10-09 DIAGNOSIS — Z794 Long term (current) use of insulin: Secondary | ICD-10-CM | POA: Diagnosis not present

## 2017-10-09 DIAGNOSIS — I10 Essential (primary) hypertension: Secondary | ICD-10-CM | POA: Diagnosis not present

## 2017-10-10 DIAGNOSIS — E1169 Type 2 diabetes mellitus with other specified complication: Secondary | ICD-10-CM | POA: Diagnosis not present

## 2017-10-10 DIAGNOSIS — I2510 Atherosclerotic heart disease of native coronary artery without angina pectoris: Secondary | ICD-10-CM | POA: Diagnosis not present

## 2017-10-10 DIAGNOSIS — B9620 Unspecified Escherichia coli [E. coli] as the cause of diseases classified elsewhere: Secondary | ICD-10-CM | POA: Diagnosis not present

## 2017-10-10 DIAGNOSIS — E1165 Type 2 diabetes mellitus with hyperglycemia: Secondary | ICD-10-CM | POA: Diagnosis not present

## 2017-10-10 DIAGNOSIS — I10 Essential (primary) hypertension: Secondary | ICD-10-CM | POA: Diagnosis not present

## 2017-10-10 DIAGNOSIS — N3090 Cystitis, unspecified without hematuria: Secondary | ICD-10-CM | POA: Diagnosis not present

## 2017-10-10 DIAGNOSIS — R339 Retention of urine, unspecified: Secondary | ICD-10-CM | POA: Diagnosis not present

## 2017-10-10 DIAGNOSIS — E1142 Type 2 diabetes mellitus with diabetic polyneuropathy: Secondary | ICD-10-CM | POA: Diagnosis not present

## 2017-10-10 DIAGNOSIS — E785 Hyperlipidemia, unspecified: Secondary | ICD-10-CM | POA: Diagnosis not present

## 2017-11-08 DIAGNOSIS — R338 Other retention of urine: Secondary | ICD-10-CM | POA: Diagnosis not present

## 2017-11-08 DIAGNOSIS — N401 Enlarged prostate with lower urinary tract symptoms: Secondary | ICD-10-CM | POA: Diagnosis not present

## 2017-11-26 DIAGNOSIS — I10 Essential (primary) hypertension: Secondary | ICD-10-CM | POA: Diagnosis not present

## 2017-11-26 DIAGNOSIS — E118 Type 2 diabetes mellitus with unspecified complications: Secondary | ICD-10-CM | POA: Diagnosis not present

## 2017-11-26 DIAGNOSIS — E039 Hypothyroidism, unspecified: Secondary | ICD-10-CM | POA: Diagnosis not present

## 2017-11-26 DIAGNOSIS — Z6826 Body mass index (BMI) 26.0-26.9, adult: Secondary | ICD-10-CM | POA: Diagnosis not present

## 2017-12-03 DIAGNOSIS — E785 Hyperlipidemia, unspecified: Secondary | ICD-10-CM | POA: Diagnosis not present

## 2017-12-03 DIAGNOSIS — I2510 Atherosclerotic heart disease of native coronary artery without angina pectoris: Secondary | ICD-10-CM | POA: Diagnosis not present

## 2017-12-03 DIAGNOSIS — I10 Essential (primary) hypertension: Secondary | ICD-10-CM | POA: Diagnosis not present

## 2017-12-11 DIAGNOSIS — R079 Chest pain, unspecified: Secondary | ICD-10-CM | POA: Diagnosis not present

## 2017-12-14 DIAGNOSIS — I2510 Atherosclerotic heart disease of native coronary artery without angina pectoris: Secondary | ICD-10-CM | POA: Diagnosis not present

## 2017-12-14 DIAGNOSIS — I10 Essential (primary) hypertension: Secondary | ICD-10-CM | POA: Diagnosis not present

## 2017-12-14 DIAGNOSIS — E785 Hyperlipidemia, unspecified: Secondary | ICD-10-CM | POA: Diagnosis not present

## 2017-12-18 DIAGNOSIS — I2510 Atherosclerotic heart disease of native coronary artery without angina pectoris: Secondary | ICD-10-CM | POA: Diagnosis not present

## 2017-12-21 DIAGNOSIS — I2510 Atherosclerotic heart disease of native coronary artery without angina pectoris: Secondary | ICD-10-CM | POA: Diagnosis not present

## 2017-12-21 DIAGNOSIS — E785 Hyperlipidemia, unspecified: Secondary | ICD-10-CM | POA: Diagnosis not present

## 2017-12-21 DIAGNOSIS — I10 Essential (primary) hypertension: Secondary | ICD-10-CM | POA: Diagnosis not present

## 2017-12-21 DIAGNOSIS — R0602 Shortness of breath: Secondary | ICD-10-CM | POA: Diagnosis not present

## 2018-01-14 DIAGNOSIS — R338 Other retention of urine: Secondary | ICD-10-CM | POA: Diagnosis not present

## 2018-01-14 DIAGNOSIS — N401 Enlarged prostate with lower urinary tract symptoms: Secondary | ICD-10-CM | POA: Diagnosis not present

## 2018-01-21 DIAGNOSIS — E785 Hyperlipidemia, unspecified: Secondary | ICD-10-CM | POA: Diagnosis not present

## 2018-01-21 DIAGNOSIS — I10 Essential (primary) hypertension: Secondary | ICD-10-CM | POA: Diagnosis not present

## 2018-01-21 DIAGNOSIS — I2510 Atherosclerotic heart disease of native coronary artery without angina pectoris: Secondary | ICD-10-CM | POA: Diagnosis not present

## 2018-01-21 DIAGNOSIS — R0602 Shortness of breath: Secondary | ICD-10-CM | POA: Diagnosis not present

## 2018-03-22 DIAGNOSIS — I2510 Atherosclerotic heart disease of native coronary artery without angina pectoris: Secondary | ICD-10-CM | POA: Diagnosis not present

## 2018-03-22 DIAGNOSIS — E785 Hyperlipidemia, unspecified: Secondary | ICD-10-CM | POA: Diagnosis not present

## 2018-03-22 DIAGNOSIS — I10 Essential (primary) hypertension: Secondary | ICD-10-CM | POA: Diagnosis not present

## 2018-03-26 DIAGNOSIS — E1140 Type 2 diabetes mellitus with diabetic neuropathy, unspecified: Secondary | ICD-10-CM | POA: Diagnosis not present

## 2018-03-26 DIAGNOSIS — I10 Essential (primary) hypertension: Secondary | ICD-10-CM | POA: Diagnosis not present

## 2018-03-26 DIAGNOSIS — Z794 Long term (current) use of insulin: Secondary | ICD-10-CM | POA: Diagnosis not present

## 2018-03-26 DIAGNOSIS — E1165 Type 2 diabetes mellitus with hyperglycemia: Secondary | ICD-10-CM | POA: Diagnosis not present

## 2018-03-26 DIAGNOSIS — E039 Hypothyroidism, unspecified: Secondary | ICD-10-CM | POA: Diagnosis not present

## 2018-06-10 DIAGNOSIS — N130 Hydronephrosis with ureteropelvic junction obstruction: Secondary | ICD-10-CM | POA: Diagnosis not present

## 2018-06-10 DIAGNOSIS — R3914 Feeling of incomplete bladder emptying: Secondary | ICD-10-CM | POA: Diagnosis not present

## 2018-06-10 DIAGNOSIS — N401 Enlarged prostate with lower urinary tract symptoms: Secondary | ICD-10-CM | POA: Diagnosis not present

## 2018-06-18 DIAGNOSIS — H524 Presbyopia: Secondary | ICD-10-CM | POA: Diagnosis not present

## 2018-06-18 DIAGNOSIS — Z0100 Encounter for examination of eyes and vision without abnormal findings: Secondary | ICD-10-CM | POA: Diagnosis not present

## 2018-06-18 DIAGNOSIS — E119 Type 2 diabetes mellitus without complications: Secondary | ICD-10-CM | POA: Diagnosis not present

## 2018-06-18 LAB — HM DIABETES EYE EXAM

## 2018-07-15 DIAGNOSIS — N401 Enlarged prostate with lower urinary tract symptoms: Secondary | ICD-10-CM | POA: Diagnosis not present

## 2018-07-15 DIAGNOSIS — R3914 Feeling of incomplete bladder emptying: Secondary | ICD-10-CM | POA: Diagnosis not present

## 2018-07-29 DIAGNOSIS — Z7189 Other specified counseling: Secondary | ICD-10-CM | POA: Diagnosis not present

## 2018-07-29 DIAGNOSIS — E1165 Type 2 diabetes mellitus with hyperglycemia: Secondary | ICD-10-CM | POA: Diagnosis not present

## 2018-07-29 DIAGNOSIS — Z0000 Encounter for general adult medical examination without abnormal findings: Secondary | ICD-10-CM | POA: Diagnosis not present

## 2018-07-29 DIAGNOSIS — E039 Hypothyroidism, unspecified: Secondary | ICD-10-CM | POA: Diagnosis not present

## 2018-07-29 DIAGNOSIS — E1142 Type 2 diabetes mellitus with diabetic polyneuropathy: Secondary | ICD-10-CM | POA: Diagnosis not present

## 2018-07-29 DIAGNOSIS — I10 Essential (primary) hypertension: Secondary | ICD-10-CM | POA: Diagnosis not present

## 2018-08-17 DIAGNOSIS — Z7189 Other specified counseling: Secondary | ICD-10-CM | POA: Diagnosis not present

## 2018-08-17 DIAGNOSIS — L03031 Cellulitis of right toe: Secondary | ICD-10-CM | POA: Diagnosis not present

## 2018-08-17 DIAGNOSIS — Z794 Long term (current) use of insulin: Secondary | ICD-10-CM | POA: Diagnosis not present

## 2018-08-17 DIAGNOSIS — I872 Venous insufficiency (chronic) (peripheral): Secondary | ICD-10-CM | POA: Diagnosis not present

## 2018-08-17 DIAGNOSIS — I10 Essential (primary) hypertension: Secondary | ICD-10-CM | POA: Diagnosis not present

## 2018-08-17 DIAGNOSIS — E1142 Type 2 diabetes mellitus with diabetic polyneuropathy: Secondary | ICD-10-CM | POA: Diagnosis not present

## 2018-08-17 DIAGNOSIS — E118 Type 2 diabetes mellitus with unspecified complications: Secondary | ICD-10-CM | POA: Diagnosis not present

## 2018-08-17 DIAGNOSIS — E1140 Type 2 diabetes mellitus with diabetic neuropathy, unspecified: Secondary | ICD-10-CM | POA: Diagnosis not present

## 2018-08-17 DIAGNOSIS — E1165 Type 2 diabetes mellitus with hyperglycemia: Secondary | ICD-10-CM | POA: Diagnosis not present

## 2018-08-17 DIAGNOSIS — E039 Hypothyroidism, unspecified: Secondary | ICD-10-CM | POA: Diagnosis not present

## 2018-08-19 ENCOUNTER — Other Ambulatory Visit (HOSPITAL_COMMUNITY): Payer: Self-pay | Admitting: Family Medicine

## 2018-08-19 ENCOUNTER — Other Ambulatory Visit: Payer: Self-pay

## 2018-08-19 ENCOUNTER — Ambulatory Visit (HOSPITAL_COMMUNITY)
Admission: RE | Admit: 2018-08-19 | Discharge: 2018-08-19 | Disposition: A | Discharge status: Home / Self Care | Payer: Medicare HMO | Source: Ambulatory Visit | Attending: Family Medicine | Admitting: Family Medicine

## 2018-08-19 DIAGNOSIS — Z0131 Encounter for examination of blood pressure with abnormal findings: Secondary | ICD-10-CM | POA: Diagnosis not present

## 2018-08-19 DIAGNOSIS — S90422A Blister (nonthermal), left great toe, initial encounter: Secondary | ICD-10-CM

## 2018-08-19 DIAGNOSIS — M19072 Primary osteoarthritis, left ankle and foot: Secondary | ICD-10-CM | POA: Diagnosis not present

## 2018-08-22 DIAGNOSIS — E785 Hyperlipidemia, unspecified: Secondary | ICD-10-CM | POA: Diagnosis not present

## 2018-08-22 DIAGNOSIS — R0602 Shortness of breath: Secondary | ICD-10-CM | POA: Diagnosis not present

## 2018-08-22 DIAGNOSIS — I10 Essential (primary) hypertension: Secondary | ICD-10-CM | POA: Diagnosis not present

## 2018-08-22 DIAGNOSIS — I2510 Atherosclerotic heart disease of native coronary artery without angina pectoris: Secondary | ICD-10-CM | POA: Diagnosis not present

## 2018-08-26 ENCOUNTER — Other Ambulatory Visit: Payer: Self-pay

## 2018-08-26 ENCOUNTER — Ambulatory Visit (INDEPENDENT_AMBULATORY_CARE_PROVIDER_SITE_OTHER): Payer: Medicare HMO

## 2018-08-26 ENCOUNTER — Other Ambulatory Visit (INDEPENDENT_AMBULATORY_CARE_PROVIDER_SITE_OTHER): Payer: Self-pay | Admitting: Family Medicine

## 2018-08-26 DIAGNOSIS — I739 Peripheral vascular disease, unspecified: Secondary | ICD-10-CM

## 2018-09-03 DIAGNOSIS — Z794 Long term (current) use of insulin: Secondary | ICD-10-CM | POA: Diagnosis not present

## 2018-09-03 DIAGNOSIS — E118 Type 2 diabetes mellitus with unspecified complications: Secondary | ICD-10-CM | POA: Diagnosis not present

## 2018-09-03 DIAGNOSIS — I739 Peripheral vascular disease, unspecified: Secondary | ICD-10-CM | POA: Diagnosis not present

## 2018-09-03 DIAGNOSIS — I10 Essential (primary) hypertension: Secondary | ICD-10-CM | POA: Diagnosis not present

## 2018-09-03 DIAGNOSIS — E1169 Type 2 diabetes mellitus with other specified complication: Secondary | ICD-10-CM | POA: Diagnosis not present

## 2018-09-03 DIAGNOSIS — E039 Hypothyroidism, unspecified: Secondary | ICD-10-CM | POA: Diagnosis not present

## 2018-09-03 DIAGNOSIS — E1165 Type 2 diabetes mellitus with hyperglycemia: Secondary | ICD-10-CM | POA: Diagnosis not present

## 2018-09-03 DIAGNOSIS — Z0000 Encounter for general adult medical examination without abnormal findings: Secondary | ICD-10-CM | POA: Diagnosis not present

## 2018-09-03 DIAGNOSIS — E1142 Type 2 diabetes mellitus with diabetic polyneuropathy: Secondary | ICD-10-CM | POA: Diagnosis not present

## 2018-09-03 DIAGNOSIS — E1140 Type 2 diabetes mellitus with diabetic neuropathy, unspecified: Secondary | ICD-10-CM | POA: Diagnosis not present

## 2018-09-03 DIAGNOSIS — Z7189 Other specified counseling: Secondary | ICD-10-CM | POA: Diagnosis not present

## 2018-09-03 DIAGNOSIS — L03032 Cellulitis of left toe: Secondary | ICD-10-CM | POA: Diagnosis not present

## 2018-09-03 DIAGNOSIS — I872 Venous insufficiency (chronic) (peripheral): Secondary | ICD-10-CM | POA: Diagnosis not present

## 2018-09-10 LAB — CBC AND DIFFERENTIAL
HCT: 35 — AB (ref 41–53)
Platelets: 256 (ref 150–399)

## 2018-09-10 LAB — HEMOGLOBIN A1C: Hemoglobin A1C: 7.7

## 2018-09-26 ENCOUNTER — Other Ambulatory Visit: Payer: Self-pay

## 2018-09-26 ENCOUNTER — Encounter: Payer: Self-pay | Admitting: Podiatry

## 2018-09-26 ENCOUNTER — Ambulatory Visit: Payer: Medicare HMO | Admitting: Podiatry

## 2018-09-26 DIAGNOSIS — E1159 Type 2 diabetes mellitus with other circulatory complications: Secondary | ICD-10-CM | POA: Diagnosis not present

## 2018-09-26 DIAGNOSIS — E1142 Type 2 diabetes mellitus with diabetic polyneuropathy: Secondary | ICD-10-CM

## 2018-09-26 DIAGNOSIS — L03032 Cellulitis of left toe: Secondary | ICD-10-CM

## 2018-09-26 DIAGNOSIS — E1140 Type 2 diabetes mellitus with diabetic neuropathy, unspecified: Secondary | ICD-10-CM | POA: Insufficient documentation

## 2018-09-26 HISTORY — DX: Cellulitis of left toe: L03.032

## 2018-09-26 MED ORDER — DOXYCYCLINE HYCLATE 100 MG PO TABS: 100.0000 mg | ORAL_TABLET | Freq: Two times a day (BID) | ORAL | 0 refills | Status: DC

## 2018-09-26 NOTE — Progress Notes (Signed)
This patient presents the office with chief complaint of a painful big toenail that is infected on his left foot.   He says this toe has been painful and draining for the last 2 to 3 weeks.  Patient states that his toe throbs at night when he sleeps.  Patient has previously been treated with antibiotics and the problem persists.  He presents the office today for an evaluation and treatment of this infected toenail left big toe.  Examination of his toe reveals the toe is unattached and has granulation tissue noted along the inside border.  He presents the office today for an evaluation and treatment of this painful nail.  This patient is diabetic with neuropathy and angiopathy.  Patient states that he has finished taking his antibiotics and there are no antibiotics at home.  He presents the office for evaluation and treatment of this painful toenail left foot.  General Appearance  Alert, conversant and in no acute stress.  Vascular  Dorsalis pedis and posterior tibial  pulses are not  palpable  bilaterally.  Capillary return is within normal limits  bilaterally. Temperature  Is diminished feet  B/L. bilaterally.  Neurologic  Senn-Weinstein monofilament wire test within normal limits  Right foot.  LOPS absent left foot.  . Muscle power within normal limits bilaterally.  Nails Thick disfigured discolored nails with subungual debris  from hallux to fifth toes bilaterally.  Examination of the left hallux toenail reveals the toenail to  be unattached to the nail bed.  There is granulation tissue noted along the medial border and drainage noted subungually.  Pincer nails  Hallux  B/L.  Orthopedic  No limitations of motion  feet .  No crepitus or effusions noted.  No bony pathology or digital deformities noted.  Skin  normotropic skin with no porokeratosis noted bilaterally.  No signs of infections or ulcers noted.    Paronychia left hallux.  Pincer nails  Hallux  B/l   IE.  Excision nail plate left  hallux  toenail.  Neosporin/DSD.  Home soaking instructions were dispensed.  Prescribe doxycycline.  RTC 1 week.   [name] DPM

## 2018-10-03 ENCOUNTER — Ambulatory Visit: Payer: Medicare HMO | Admitting: Podiatry

## 2018-10-03 ENCOUNTER — Other Ambulatory Visit: Payer: Self-pay

## 2018-10-03 ENCOUNTER — Encounter: Payer: Self-pay | Admitting: Podiatry

## 2018-10-03 ENCOUNTER — Encounter (INDEPENDENT_AMBULATORY_CARE_PROVIDER_SITE_OTHER): Payer: Medicare HMO | Admitting: Vascular Surgery

## 2018-10-03 DIAGNOSIS — E1142 Type 2 diabetes mellitus with diabetic polyneuropathy: Secondary | ICD-10-CM

## 2018-10-03 DIAGNOSIS — L03032 Cellulitis of left toe: Secondary | ICD-10-CM

## 2018-10-03 NOTE — Progress Notes (Signed)
This patient returns to the office follow-up for diagnosis of a paronychia left hallux.  Patient was seen 1 week ago and the great toenail on the left foot was completely avulsed.  The nail was unattached from the nailbed at that initial visit.  The nail was removed and he was given home soaking instructions.  He was also prescribed doxycycline which she is still taking.  He states that his left great toe is doing better and he is not having any pain or discomfort.  He is pleased with the toes healing.  General Appearance  Alert, conversant and in no acute stress.  Vascular  Dorsalis pedis and posterior tibial  pulses are  Not  palpable  bilaterally.  Capillary return is within normal limits  bilaterally. Temperature is within normal limits  bilaterally.  Neurologic  Senn-Weinstein monofilament wire test within normal limits  Right foot.  LOPS left foot absent.. Muscle power within normal limits bilaterally.  Nails Thick disfigured discolored nails with subungual debris  from hallux to fifth toes bilaterally. No evidence of bacterial infection or drainage bilaterally. Healthy granulation noted at nail bed left hallux.  No signs of infection noted.  Orthopedic  No limitations of motion  feet .  No crepitus or effusions noted.  No bony pathology or digital deformities noted.  Skin  normotropic skin with no porokeratosis noted bilaterally.  No signs of infections or ulcers noted.    Paronychia left hallux.    ROV.  Healing at site of nail removal.  Healthy granulation tissue in nail bed.   Patient was told to soak his toe daily and peroxide his toe daily.  He is to return to the office in 2 weeks for evaluation.  Patient  still has peripheral vascular disease and I want to check his foot to check on his healing process.  RTC 2 weeks.   [name] DPM

## 2018-10-21 ENCOUNTER — Other Ambulatory Visit: Payer: Self-pay

## 2018-10-21 ENCOUNTER — Encounter: Payer: Self-pay | Admitting: Podiatry

## 2018-10-21 ENCOUNTER — Ambulatory Visit: Payer: Medicare HMO | Admitting: Podiatry

## 2018-10-21 DIAGNOSIS — L97509 Non-pressure chronic ulcer of other part of unspecified foot with unspecified severity: Secondary | ICD-10-CM | POA: Insufficient documentation

## 2018-10-21 DIAGNOSIS — E1142 Type 2 diabetes mellitus with diabetic polyneuropathy: Secondary | ICD-10-CM

## 2018-10-21 DIAGNOSIS — L97521 Non-pressure chronic ulcer of other part of left foot limited to breakdown of skin: Secondary | ICD-10-CM

## 2018-10-21 DIAGNOSIS — Z09 Encounter for follow-up examination after completed treatment for conditions other than malignant neoplasm: Secondary | ICD-10-CM

## 2018-10-21 MED ORDER — DOXYCYCLINE HYCLATE 100 MG PO TABS: 100.0000 mg | ORAL_TABLET | Freq: Two times a day (BID) | ORAL | 0 refills | Status: DC

## 2018-10-21 NOTE — Progress Notes (Signed)
This patient returns to the office for continued follow-up evaluation of a paronychia left big toe in which his  nail plate big toenail  was removed.  Patient is diabetic and has been previously diagnosed with PVD.  He says that the big toenail on the left great toe has not been hurting and there has been no drainage from the nail site.  He says he has been applying peroxide daily to the nail site.  He says he has developed an split in his skin on the outside of his left foot.  He says the split has been present for the last few days and he has been cleaning the split with peroxide washes.  He says there is peroxide noted at the site of the split.  He presents the office for an evaluation of the great toe left foot but also desires an evaluation and treatment of the open wound left foot.  General Appearance  Alert, conversant and in no acute stress.  Vascular  Dorsalis pedis and posterior tibial  pulses are not  palpable  bilaterally.  Capillary return is within normal limits  bilaterally. Temperature is within normal limits  bilaterally.  Neurologic  Senn-Weinstein monofilament wire test within normal limits right foot.  LOPS absent left foot.. Muscle power within normal limits bilaterally.  Nails Thick disfigured discolored nails with subungual debris  from hallux to fifth toes bilaterally. No evidence of bacterial infection or drainage bilaterally.Healing granulation tissue at distal aspect left nail bed.  No signs of redness or drainage noted.    Orthopedic  No limitations of motion  feet .  No crepitus or effusions noted.  No bony pathology or digital deformities noted.  Skin  Ulcer measuring 1 mm x 10 mm noted at the lateral aspect of fifth midshaft   Drainage noted at ulcer site.    Diabetic ulcer left foot.  Healing paronychia hallux left foot.  ROV.  His great toe is healing but due to his diabetes and circulatory disease is healing slowly.  He has developed a diabetic ulcer on the lateral  aspect of the left foot.  There is drainage noted at the site of the ulcer.  This ulcer was bandaged with silvadene/DSD.   Home soaking instructions were given to this patient.  Prescribed another round of doxycycline 100 mg for this patient to take daily.  Return to the clinic in 10 days for continued evaluation and treatment.  I am still planning on requesting vascular studies for this patient.   Dylan Robles DPM

## 2018-10-21 NOTE — Patient Instructions (Signed)

## 2018-10-31 ENCOUNTER — Ambulatory Visit (INDEPENDENT_AMBULATORY_CARE_PROVIDER_SITE_OTHER): Payer: Medicare HMO | Admitting: Podiatry

## 2018-10-31 ENCOUNTER — Encounter: Payer: Self-pay | Admitting: Podiatry

## 2018-10-31 ENCOUNTER — Other Ambulatory Visit: Payer: Self-pay

## 2018-10-31 DIAGNOSIS — I739 Peripheral vascular disease, unspecified: Secondary | ICD-10-CM

## 2018-10-31 DIAGNOSIS — L97521 Non-pressure chronic ulcer of other part of left foot limited to breakdown of skin: Secondary | ICD-10-CM | POA: Diagnosis not present

## 2018-10-31 DIAGNOSIS — E1142 Type 2 diabetes mellitus with diabetic polyneuropathy: Secondary | ICD-10-CM

## 2018-10-31 DIAGNOSIS — Z09 Encounter for follow-up examination after completed treatment for conditions other than malignant neoplasm: Secondary | ICD-10-CM

## 2018-10-31 MED ORDER — DOXYCYCLINE HYCLATE 100 MG PO TABS: 100.0000 mg | ORAL_TABLET | Freq: Two times a day (BID) | ORAL | 0 refills | Status: DC

## 2018-10-31 NOTE — Progress Notes (Signed)
This patient returns to the office for continued follow-up evaluation of a paronychia left big toe in which his  nail plate big toenail  was removed.  Patient is diabetic and has been previously diagnosed with PVD.  He says that the big toenail on the left great toe has not been hurting and there has been no drainage from the nail site.  He says he has been applying peroxide daily to the nail site.  He has a diabetic ulcer on the lateral aspect of the fifth metatarsal left foot.  He says that the ulcer is doing much better today.  He presents the office with his wife for an evaluation of this diabetic ulcer.  He says he has been soaking his foot and peroxide and his foot as well as bandaging his foot.  He is pleased with his improvement.  General Appearance  Alert, conversant and in no acute stress.  Vascular  Dorsalis pedis and posterior tibial  pulses are not  palpable  bilaterally.  Capillary return is within normal limits  bilaterally. Temperature is within normal limits  bilaterally.  Neurologic  Senn-Weinstein monofilament wire test within normal limits right foot.  LOPS absent left foot.. Muscle power within normal limits bilaterally.  Nails Thick disfigured discolored nails with subungual debris  from hallux to fifth toes bilaterally. No evidence of bacterial infection or drainage bilaterally.Healing tissue noted at site nail bed left hallux.  No signs of redness or drainage noted.    Orthopedic  No limitations of motion  feet .  No crepitus or effusions noted.  No bony pathology or digital deformities noted.  Skin  Ulcer measuring 10 mm. X 15 mm.    There is new pink skin developing around his diabetic ulcer.  No evidence of infection noted.  Diabetic ulcer left foot.  Healing paronychia hallux left foot.  ROV.  His great toe is healing but due to his diabetes and PVD it is slowly healing.      There is drainage noted at the site of the ulcer.  This ulcer was bandaged with silvadene/DSD.    Home soaking instructions were given to this patient.  Prescribed another round of doxycycline 100 mg for this patient to take daily.  Return to the clinic in 3 weeks  for continued evaluation and treatment.  Call the office if the wound reopens or worsens   [name] DPM

## 2018-11-13 LAB — IRON,TIBC AND FERRITIN PANEL
%SAT: 25
Ferritin: 89
Iron: 63
TIBC: 255
UIBC: 192

## 2018-11-13 LAB — VITAMIN B12: Vitamin B-12: 241

## 2018-11-14 ENCOUNTER — Encounter: Payer: Self-pay | Admitting: Gastroenterology

## 2018-11-18 ENCOUNTER — Other Ambulatory Visit: Payer: Self-pay

## 2018-11-18 ENCOUNTER — Ambulatory Visit (INDEPENDENT_AMBULATORY_CARE_PROVIDER_SITE_OTHER): Payer: Medicare HMO | Admitting: Podiatry

## 2018-11-18 DIAGNOSIS — Z09 Encounter for follow-up examination after completed treatment for conditions other than malignant neoplasm: Secondary | ICD-10-CM

## 2018-11-18 DIAGNOSIS — I739 Peripheral vascular disease, unspecified: Secondary | ICD-10-CM

## 2018-11-18 DIAGNOSIS — L97521 Non-pressure chronic ulcer of other part of left foot limited to breakdown of skin: Secondary | ICD-10-CM | POA: Diagnosis not present

## 2018-11-18 DIAGNOSIS — E1142 Type 2 diabetes mellitus with diabetic polyneuropathy: Secondary | ICD-10-CM

## 2018-11-18 DIAGNOSIS — L03032 Cellulitis of left toe: Secondary | ICD-10-CM

## 2018-11-18 NOTE — Progress Notes (Signed)
This patient returns to the office for continued follow-up evaluation of a paronychia left big toe in which his  nail plate big toenail  was removed.  Patient is diabetic and has been previously diagnosed with PVD.  He says that the big toenail on the left great toe has not been hurting and there has been no drainage from the nail site.  He says he has been applying peroxide daily to the nail site.  He has a diabetic ulcer on the lateral aspect of the fifth metatarsal left foot.  He says that the ulcer is doing much better today.  He says there is no drainage from the ulcer site and has finished his antibiotics.  He presents the office with his wife for an evaluation of this diabetic ulcer.  He says he has been soaking his foot and peroxide and his foot as well as bandaging his foot.  He is pleased with his improvement.  His wife says he experiences severe pain at night sleeping which gets better when  he lowers his leg.  General Appearance  Alert, conversant and in no acute stress.  Vascular  Dorsalis pedis and posterior tibial  pulses are not  palpable  bilaterally.  Capillary return is within normal limits  bilaterally. Temperature is within normal limits  bilaterally.  Neurologic  Senn-Weinstein monofilament wire test within normal limits right foot.  LOPS absent left foot.. Muscle power within normal limits bilaterally.  Nails Thick disfigured discolored nails with subungual debris  from hallux to fifth toes bilaterally. No evidence of bacterial infection or drainage bilaterally.Healing tissue noted at site nail bed left hallux.  No signs of redness or drainage noted.    Orthopedic  No limitations of motion  feet .  No crepitus or effusions noted.  No bony pathology or digital deformities noted.  Skin  Ulcer measuring 3 mm.  mm. X 10 mm.    There is new pink skin developing around his diabetic ulcer.  No evidence of infection noted.  Diabetic ulcer left foot.  Healing paronychia hallux left  foot.  ROV.  His great toe is healing but due to his diabetes and PVD it is slowly healing.      There is drainage noted at the site of the ulcer.  This ulcer was bandaged with silvadene/DSD.   Home soaking instructions were given to this patient.   Return to the clinic in 4  weeks  for continued evaluation and treatment.  Call the office if the wound reopens or worsens   [name] DPM

## 2018-12-16 ENCOUNTER — Encounter: Payer: Self-pay | Admitting: Podiatry

## 2018-12-16 ENCOUNTER — Ambulatory Visit (INDEPENDENT_AMBULATORY_CARE_PROVIDER_SITE_OTHER): Payer: Medicare HMO | Admitting: Podiatry

## 2018-12-16 DIAGNOSIS — E1159 Type 2 diabetes mellitus with other circulatory complications: Secondary | ICD-10-CM

## 2018-12-16 DIAGNOSIS — L97521 Non-pressure chronic ulcer of other part of left foot limited to breakdown of skin: Secondary | ICD-10-CM | POA: Diagnosis not present

## 2018-12-16 DIAGNOSIS — I739 Peripheral vascular disease, unspecified: Secondary | ICD-10-CM | POA: Diagnosis not present

## 2018-12-16 DIAGNOSIS — E1142 Type 2 diabetes mellitus with diabetic polyneuropathy: Secondary | ICD-10-CM

## 2018-12-16 NOTE — Progress Notes (Signed)
This patient returns to the office for continued follow-up evaluation of a paronychia left big toe in which his  nail plate big toenail  was removed.  Patient is diabetic and has been previously diagnosed with PVD.  He says that the big toenail on the left great toe has not been hurting and there has been no drainage from the nail site.  He says he has been applying peroxide daily to the nail site.  He has a diabetic ulcer on the lateral aspect of the fifth metatarsal left foot.  He says that the ulcer is doing much better today.  He says there is no drainage from the ulcer site .  He presents the office with his wife for an evaluation of this diabetic ulcer.  He says he has been soaking his ulcer  and peroxiding   his foot  His wife says he experiences severe pain at night sleeping which gets better when  he lowers his leg.  General Appearance  Alert, conversant and in no acute stress.  Vascular  Dorsalis pedis and posterior tibial  pulses are not  palpable  bilaterally.  Capillary return is within normal limits  bilaterally. Cold feet noted  B/L.    Neurologic  Senn-Weinstein monofilament wire test diminished  right foot.  LOPS absent left foot.. Muscle power within normal limits bilaterally.  Nails Thick disfigured discolored nails with subungual debris  from hallux to fifth toes bilaterally. Healing tissue noted at site nail bed left hallux.  No signs of redness or drainage noted.    Orthopedic  No limitations of motion  feet .  No crepitus or effusions noted.  No bony pathology or digital deformities noted.  Skin  Ulcer measuring 15 .  mm. X 10 mm.    There is new pink skin developing around his diabetic ulcer.  No evidence of infection noted.  Necrotic tissue incenter of ulcer.  Diabetic ulcer left foot.  Healing paronychia hallux left foot.  ROV.  His great toe is healing but due to his diabetes and PVD it is slowly healing.      There is no  drainage noted at the site of the ulcer.  This  ulcer was bandaged with neos[porin/DSD.   Home soaking instructions were given to this patient.   Return to the clinic in 4  weeks  for continued evaluation and treatment.  Discussed this condition with this patient.  Patient has continued pain at night that is severe at times according to his wife.  The ulcer seems to be nonhealing.  I proceeded to look at his lower extremity Doppler report from 08/26/2018 and there was significant problem with his left foot.  Therefore I told this patient we will make a follow-up appointment for an actual consultation to see if there is anything that can be done to increase his blood flow.  Return to the clinic in 3 weeks for further evaluation of ulcer.  If this condition worsens or becomes very painful, the patient was told to contact this office or go to the Emergency Department at the hospital.  [name] DPM   [name] DPM

## 2018-12-17 ENCOUNTER — Telehealth: Payer: Self-pay | Admitting: Podiatry

## 2018-12-17 ENCOUNTER — Telehealth: Payer: Self-pay | Admitting: *Deleted

## 2018-12-17 MED ORDER — TRAMADOL HCL 50 MG PO TABS: 50.0000 mg | ORAL_TABLET | Freq: Three times a day (TID) | ORAL | 0 refills | Status: AC | PRN

## 2018-12-17 NOTE — Telephone Encounter (Signed)
Dr. Prudence Davidson, please advise on if you were going to send in Tramadol for this patient.  It is not mentioned in your office note. If ok, I will call it in to his pharmacy

## 2018-12-17 NOTE — Telephone Encounter (Signed)
Dr. Prudence Davidson ordered Tramadol 50mg  #30 one tablet every 8 hour prn pain. I informed pt's wife, [name] of the tramadol orders to [place]. [name] asked if the appt had been made to [facility] in [place]. I told pt I had not received those orders but would make certain Dr. Prudence Davidson sent to the nurse in [place]. Left message at [facility] with Tramadol orders.

## 2018-12-17 NOTE — Telephone Encounter (Signed)
Pt was seen in office yesterday and was supposed to have a prescription for Tramadol sent in but when he checked with the pharmacy they had not received it.  Pharmacy is [facility] nurse in [place]

## 2018-12-17 NOTE — Telephone Encounter (Signed)
Called and spoke w/ patient's wife , informed that prescription(Tramadol-50mg )was sent to pharmacy on file, verbalized understanding and stated that she will pick up soon.

## 2018-12-19 ENCOUNTER — Telehealth (INDEPENDENT_AMBULATORY_CARE_PROVIDER_SITE_OTHER): Payer: Self-pay

## 2018-12-19 ENCOUNTER — Ambulatory Visit (INDEPENDENT_AMBULATORY_CARE_PROVIDER_SITE_OTHER): Payer: Medicare HMO | Admitting: Vascular Surgery

## 2018-12-19 ENCOUNTER — Encounter (INDEPENDENT_AMBULATORY_CARE_PROVIDER_SITE_OTHER): Payer: Self-pay | Admitting: Vascular Surgery

## 2018-12-19 ENCOUNTER — Other Ambulatory Visit: Payer: Self-pay

## 2018-12-19 VITALS — BP 146/60 | HR 87 | Resp 15 | Ht 78.0 in | Wt 208.0 lb

## 2018-12-19 DIAGNOSIS — E1169 Type 2 diabetes mellitus with other specified complication: Secondary | ICD-10-CM | POA: Diagnosis not present

## 2018-12-19 DIAGNOSIS — I7025 Atherosclerosis of native arteries of other extremities with ulceration: Secondary | ICD-10-CM | POA: Insufficient documentation

## 2018-12-19 DIAGNOSIS — E782 Mixed hyperlipidemia: Secondary | ICD-10-CM

## 2018-12-19 DIAGNOSIS — I10 Essential (primary) hypertension: Secondary | ICD-10-CM | POA: Diagnosis not present

## 2018-12-19 DIAGNOSIS — I70219 Atherosclerosis of native arteries of extremities with intermittent claudication, unspecified extremity: Secondary | ICD-10-CM | POA: Insufficient documentation

## 2018-12-19 DIAGNOSIS — I25118 Atherosclerotic heart disease of native coronary artery with other forms of angina pectoris: Secondary | ICD-10-CM

## 2018-12-19 DIAGNOSIS — E785 Hyperlipidemia, unspecified: Secondary | ICD-10-CM

## 2018-12-19 NOTE — Telephone Encounter (Signed)
Patient's wife called and the patient is now scheduled with Dr. Delana Meyer for left leg angio on 12/24/2018 with a 12:30 pm arrival time to the [facility]. Patient will do covid testing on 12/20/2018 between 12:30-2:30 pm at the [facility]. Pre-procedure instructions were discussed and will be mailed to the patient.

## 2018-12-19 NOTE — Progress Notes (Signed)
MRN : [id number]  Dylan Robles is a 78 y.o. ([birth year]) male who presents with chief complaint of No chief complaint on file. [name]  History of Present Illness: The patient is seen for evaluation of painful lower extremities and diminished pulses associated with ulceration of the foot.  The patient notes the ulcer has been present for multiple weeks and has not been improving.  It is very painful and has had some drainage.  No specific history of trauma noted by the patient.  The patient denies fever or chills.  the patient does have diabetes which has been difficult to control.  Patient notes prior to the ulcer developing the extremities were painful particularly with ambulation or activity and the discomfort is very consistent day today. Typically, the pain occurs at less than one block, progress is as activity continues to the point that the patient must stop walking. Resting including standing still for several minutes allowed resumption of the activity and the ability to walk a similar distance before stopping again. Uneven terrain and inclined shorten the distance. The pain has been progressive over the past several years.   The patient substantiates severe rest pain with dangling of an extremity off the side of the bed during the night for relief. No prior interventions or surgeries.  No history of back problems or DJD of the lumbar sacral spine.   The patient denies amaurosis fugax or recent TIA symptoms. There are no recent neurological changes noted. The patient denies history of DVT, PE or superficial thrombophlebitis. The patient denies recent episodes of angina or shortness of breath.   No outpatient medications have been marked as taking for the 12/19/18 encounter (Appointment) with Delana Meyer, Dolores Lory, MD.    Past Medical History:  Diagnosis Date   Coronary artery disease    hyperlipidemia   Diabetes mellitus    Diabetes mellitus without complication ([place])     Hypercholesterolemia    Hypertension    Hypothyroidism    Shortness of breath dyspnea    with exertion   Vasovagal syncope     Past Surgical History:  Procedure Laterality Date   APPENDECTOMY     CARDIAC CATHETERIZATION N/A 06/16/2014   Procedure: Left Heart Cath;  Surgeon: Dionisio David, MD;  Location: [facility];  Service: Cardiovascular;  Laterality: N/A;   CIRCUMCISION, NON-NEWBORN     HERNIA REPAIR     THYROID SURGERY      Social History Social History   Tobacco Use   Smoking status: Former Smoker   Smokeless tobacco: Never Used  Substance Use Topics   Alcohol use: No   Drug use: No    Family History Family History  Problem Relation Age of Onset   Diabetes Father    Hypertension Father    Cancer Mother    Diabetes Brother   No family history of bleeding/clotting disorders, porphyria or autoimmune disease   Allergies  Allergen Reactions   Plavix [Clopidogrel] Hives     REVIEW OF SYSTEMS (Negative unless checked)  Constitutional: [] Weight loss  [] Fever  [] Chills Cardiac: [] Chest pain   [] Chest pressure   [] Palpitations   [] Shortness of breath when laying flat   [] Shortness of breath with exertion. Vascular:  [x] Pain in legs with walking   [x] Pain in legs at rest  [] History of DVT   [] Phlebitis   [] Swelling in legs   [] Varicose veins   [x] Non-healing ulcers Pulmonary:   [] Uses home oxygen   [] Productive cough   []   Hemoptysis   [] Wheeze  [] COPD   [] Asthma Neurologic:  [] Dizziness   [] Seizures   [] History of stroke   [] History of TIA  [] Aphasia   [] Vissual changes   [] Weakness or numbness in arm   [] Weakness or numbness in leg Musculoskeletal:   [] Joint swelling   [x] Joint pain   [] Low back pain Hematologic:  [] Easy bruising  [] Easy bleeding   [] Hypercoagulable state   [] Anemic Gastrointestinal:  [] Diarrhea   [] Vomiting  [] Gastroesophageal reflux/heartburn   [] Difficulty swallowing. Genitourinary:  [] Chronic kidney disease   [] Difficult  urination  [] Frequent urination   [] Blood in urine Skin:  [] Rashes   [x] Ulcers  Psychological:  [] History of anxiety   []  History of major depression.  Physical Examination  There were no vitals filed for this visit. There is no height or weight on file to calculate BMI. Gen: WD/WN, NAD Head: [place]/AT, No temporalis wasting.  Ear/Nose/Throat: Hearing grossly intact, nares w/o erythema or drainage, poor dentition Eyes: PER, EOMI, sclera nonicteric.  Neck: Supple, no masses.  No bruit or JVD.  Pulmonary:  Good air movement, clear to auscultation bilaterally, no use of accessory muscles.  Cardiac: RRR, normal S1, S2, no Murmurs. Vascular: Mild venous stasis changes to the legs bilaterally.  2-3+ soft pitting edema.  Multiple small ulcers left lateral/dorsal forefoot, left great toe nail is absent.  No carotid bruits. Vessel Right Left  Radial Palpable Palpable  Popliteal Not Palpable Not Palpable  PT Not Palpable Not Palpable  DP Not Palpable Not Palpable  Gastrointestinal: soft, non-distended. No guarding/no peritoneal signs.  Musculoskeletal: M/S 5/5 throughout.  No deformity or atrophy.  Neurologic: CN 2-12 intact. Pain and light touch intact in extremities.  Symmetrical.  Speech is fluent. Motor exam as listed above. Psychiatric: Judgment intact, Mood & affect appropriate for pt's clinical situation. Dermatologic: lower leg rashes with left foot ulcers noted.  No changes consistent with cellulitis. Lymph : No Cervical lymphadenopathy, no lichenification or skin changes of chronic lymphedema.  CBC Lab Results  Component Value Date   WBC 6.2 09/21/2017   HGB 10.6 (L) 09/21/2017   HCT 32.5 (L) 09/21/2017   MCV 90.3 09/21/2017   PLT 232 09/21/2017    BMET    Component Value Date/Time   NA 137 09/21/2017 0535   K 3.8 09/21/2017 0535   CL 106 09/21/2017 0535   CO2 25 09/21/2017 0535   GLUCOSE 165 (H) 09/21/2017 0535   BUN 13 09/21/2017 0535   CREATININE 1.09 09/21/2017 0535    CALCIUM 8.5 (L) 09/21/2017 0535   GFRNONAA >60 09/21/2017 0535   GFRAA >60 09/21/2017 0535   CrCl cannot be calculated (Patient's most recent lab result is older than the maximum 21 days allowed.).  COAG No results found for: INR, PROTIME  Radiology No results found.   Assessment/Plan 1. Atherosclerosis of native arteries of the extremities with ulceration ([place])  Recommend:  The patient has evidence of severe atherosclerotic changes of both lower extremities associated with ulceration and tissue loss of the left foot.  This represents a limb threatening ischemia and places the patient at the risk for left limb loss.  Patient should undergo angiography of the left lower extremities with the hope for intervention for limb salvage.  The risks and benefits as well as the alternative therapies was discussed in detail with the patient.  All questions were answered.  Patient agrees to proceed with left leg angiography.  The patient will follow up with me in the office after the  procedure.    2. Coronary artery disease of native artery of native heart with stable angina pectoris ([place]) Continue cardiac and antihypertensive medications as already ordered and reviewed, no changes at this time.  Continue statin as ordered and reviewed, no changes at this time  Nitrates PRN for chest pain   3. Essential hypertension Continue antihypertensive medications as already ordered, these medications have been reviewed and there are no changes at this time.   4. Type 2 diabetes mellitus with hyperlipidemia ([place]) Continue hypoglycemic medications as already ordered, these medications have been reviewed and there are no changes at this time.  Hgb A1C to be monitored as already arranged by primary service    5. Mixed hyperlipidemia Continue statin as ordered and reviewed, no changes at this time    Hortencia Pilar, MD  12/19/2018 10:24 AM

## 2018-12-20 ENCOUNTER — Other Ambulatory Visit
Admission: RE | Admit: 2018-12-20 | Discharge: 2018-12-20 | Disposition: A | Discharge status: Home / Self Care | Payer: Medicare HMO | Source: Ambulatory Visit | Attending: Vascular Surgery | Admitting: Vascular Surgery

## 2018-12-20 DIAGNOSIS — Z20828 Contact with and (suspected) exposure to other viral communicable diseases: Secondary | ICD-10-CM | POA: Insufficient documentation

## 2018-12-20 DIAGNOSIS — Z01812 Encounter for preprocedural laboratory examination: Secondary | ICD-10-CM | POA: Diagnosis present

## 2018-12-21 LAB — SARS CORONAVIRUS 2 (TAT 6-24 HRS): SARS Coronavirus 2: NEGATIVE

## 2018-12-23 ENCOUNTER — Other Ambulatory Visit (INDEPENDENT_AMBULATORY_CARE_PROVIDER_SITE_OTHER): Payer: Self-pay | Admitting: Nurse Practitioner

## 2018-12-24 ENCOUNTER — Ambulatory Visit
Admission: RE | Admit: 2018-12-24 | Discharge: 2018-12-24 | Disposition: A | Discharge status: Home / Self Care | Payer: Medicare HMO | Attending: Vascular Surgery | Admitting: Vascular Surgery

## 2018-12-24 ENCOUNTER — Encounter
Admission: RE | Disposition: A | Discharge status: Home / Self Care | Payer: Self-pay | Source: Home / Self Care | Attending: Vascular Surgery

## 2018-12-24 ENCOUNTER — Other Ambulatory Visit: Payer: Self-pay

## 2018-12-24 ENCOUNTER — Encounter: Payer: Self-pay | Admitting: Vascular Surgery

## 2018-12-24 DIAGNOSIS — I70235 Atherosclerosis of native arteries of right leg with ulceration of other part of foot: Secondary | ICD-10-CM

## 2018-12-24 DIAGNOSIS — I10 Essential (primary) hypertension: Secondary | ICD-10-CM | POA: Insufficient documentation

## 2018-12-24 DIAGNOSIS — L97529 Non-pressure chronic ulcer of other part of left foot with unspecified severity: Secondary | ICD-10-CM | POA: Insufficient documentation

## 2018-12-24 DIAGNOSIS — E7800 Pure hypercholesterolemia, unspecified: Secondary | ICD-10-CM | POA: Insufficient documentation

## 2018-12-24 DIAGNOSIS — E11621 Type 2 diabetes mellitus with foot ulcer: Secondary | ICD-10-CM | POA: Diagnosis not present

## 2018-12-24 DIAGNOSIS — E782 Mixed hyperlipidemia: Secondary | ICD-10-CM | POA: Diagnosis not present

## 2018-12-24 DIAGNOSIS — I70299 Other atherosclerosis of native arteries of extremities, unspecified extremity: Secondary | ICD-10-CM

## 2018-12-24 DIAGNOSIS — E1151 Type 2 diabetes mellitus with diabetic peripheral angiopathy without gangrene: Secondary | ICD-10-CM | POA: Insufficient documentation

## 2018-12-24 DIAGNOSIS — Z87891 Personal history of nicotine dependence: Secondary | ICD-10-CM | POA: Diagnosis not present

## 2018-12-24 DIAGNOSIS — E039 Hypothyroidism, unspecified: Secondary | ICD-10-CM | POA: Diagnosis not present

## 2018-12-24 DIAGNOSIS — I70245 Atherosclerosis of native arteries of left leg with ulceration of other part of foot: Secondary | ICD-10-CM | POA: Diagnosis present

## 2018-12-24 DIAGNOSIS — I25118 Atherosclerotic heart disease of native coronary artery with other forms of angina pectoris: Secondary | ICD-10-CM | POA: Diagnosis not present

## 2018-12-24 HISTORY — PX: LOWER EXTREMITY ANGIOGRAPHY: CATH118251

## 2018-12-24 LAB — CREATININE, SERUM
Creatinine, Ser: 1.21 mg/dL (ref 0.61–1.24)
GFR calc Af Amer: >60 mL/min (ref >60)
GFR calc non Af Amer: 56 mL/min — ABNORMAL LOW (ref >60)

## 2018-12-24 LAB — BUN: BUN: 15 mg/dL (ref 8–23)

## 2018-12-24 LAB — GLUCOSE, CAPILLARY
Glucose-Capillary: 242 mg/dL — ABNORMAL HIGH (ref 70–99)
Glucose-Capillary: 232 mg/dL — ABNORMAL HIGH (ref 70–99)

## 2018-12-24 SURGERY — LOWER EXTREMITY ANGIOGRAPHY
Anesthesia: Moderate Sedation | Site: Leg Lower | Laterality: Left

## 2018-12-24 MED ORDER — FAMOTIDINE 20 MG PO TABS: 40.0000 mg | ORAL_TABLET | Freq: Once | ORAL | Status: DC | PRN

## 2018-12-24 MED ORDER — MIDAZOLAM HCL 2 MG/ML PO SYRP: 8.0000 mg | ORAL_SOLUTION | Freq: Once | ORAL | Status: DC | PRN

## 2018-12-24 MED ORDER — CEFAZOLIN SODIUM-DEXTROSE 2-4 GM/100ML-% IV SOLN: 2.0000 g | Freq: Once | INTRAVENOUS | Status: AC

## 2018-12-24 MED ORDER — HEPARIN SODIUM (PORCINE) 1000 UNIT/ML IJ SOLN
INTRAMUSCULAR | Status: AC
  Filled 2018-12-24: qty 1

## 2018-12-24 MED ORDER — OXYCODONE HCL 5 MG PO TABS: 5.0000 mg | ORAL_TABLET | ORAL | Status: DC | PRN

## 2018-12-24 MED ORDER — HYDROMORPHONE HCL 1 MG/ML IJ SOLN: 1.0000 mg | Freq: Once | INTRAMUSCULAR | Status: DC | PRN

## 2018-12-24 MED ORDER — HYDRALAZINE HCL 20 MG/ML IJ SOLN: 5.0000 mg | INTRAMUSCULAR | Status: DC | PRN

## 2018-12-24 MED ORDER — CEFAZOLIN SODIUM-DEXTROSE 2-4 GM/100ML-% IV SOLN
INTRAVENOUS | Status: AC
  Administered 2018-12-24: 2 g via INTRAVENOUS
  Filled 2018-12-24: qty 100

## 2018-12-24 MED ORDER — ONDANSETRON HCL 4 MG/2ML IJ SOLN: 4.0000 mg | Freq: Four times a day (QID) | INTRAMUSCULAR | Status: DC | PRN

## 2018-12-24 MED ORDER — DIPHENHYDRAMINE HCL 50 MG/ML IJ SOLN: 50.0000 mg | Freq: Once | INTRAMUSCULAR | Status: DC | PRN

## 2018-12-24 MED ORDER — SODIUM CHLORIDE 0.9 % IV SOLN: 250.0000 mL | INTRAVENOUS | Status: DC | PRN

## 2018-12-24 MED ORDER — LABETALOL HCL 5 MG/ML IV SOLN
INTRAVENOUS | Status: DC | PRN
  Administered 2018-12-24: 10 mg via INTRAVENOUS

## 2018-12-24 MED ORDER — FENTANYL CITRATE (PF) 100 MCG/2ML IJ SOLN
INTRAMUSCULAR | Status: AC
  Filled 2018-12-24: qty 2

## 2018-12-24 MED ORDER — METHYLPREDNISOLONE SODIUM SUCC 125 MG IJ SOLR: 125.0000 mg | Freq: Once | INTRAMUSCULAR | Status: DC | PRN

## 2018-12-24 MED ORDER — HYDRALAZINE HCL 20 MG/ML IJ SOLN
INTRAMUSCULAR | Status: DC | PRN
  Administered 2018-12-24 (×2): 10 mg via INTRAVENOUS

## 2018-12-24 MED ORDER — HYDRALAZINE HCL 20 MG/ML IJ SOLN
INTRAMUSCULAR | Status: AC
  Filled 2018-12-24: qty 1

## 2018-12-24 MED ORDER — MIDAZOLAM HCL 2 MG/2ML IJ SOLN
INTRAMUSCULAR | Status: DC | PRN
  Administered 2018-12-24 (×2): 1 mg via INTRAVENOUS
  Administered 2018-12-24: 2 mg via INTRAVENOUS

## 2018-12-24 MED ORDER — LABETALOL HCL 5 MG/ML IV SOLN
INTRAVENOUS | Status: AC
  Filled 2018-12-24: qty 4

## 2018-12-24 MED ORDER — IODIXANOL 320 MG/ML IV SOLN
INTRAVENOUS | Status: DC | PRN
  Administered 2018-12-24: 140 mL via INTRA_ARTERIAL

## 2018-12-24 MED ORDER — FENTANYL CITRATE (PF) 100 MCG/2ML IJ SOLN
INTRAMUSCULAR | Status: DC | PRN
  Administered 2018-12-24 (×3): 50 ug via INTRAVENOUS

## 2018-12-24 MED ORDER — SODIUM CHLORIDE 0.9% FLUSH: 3.0000 mL | Freq: Two times a day (BID) | INTRAVENOUS | Status: DC

## 2018-12-24 MED ORDER — MORPHINE SULFATE (PF) 4 MG/ML IV SOLN: 2.0000 mg | INTRAVENOUS | Status: DC | PRN

## 2018-12-24 MED ORDER — SODIUM CHLORIDE 0.9 % IV SOLN: INTRAVENOUS | Status: DC

## 2018-12-24 MED ORDER — SODIUM CHLORIDE 0.9% FLUSH: 3.0000 mL | INTRAVENOUS | Status: DC | PRN

## 2018-12-24 MED ORDER — LABETALOL HCL 5 MG/ML IV SOLN: 10.0000 mg | INTRAVENOUS | Status: DC | PRN

## 2018-12-24 MED ORDER — MIDAZOLAM HCL 5 MG/5ML IJ SOLN
INTRAMUSCULAR | Status: AC
  Filled 2018-12-24: qty 5

## 2018-12-24 MED ORDER — HEPARIN SODIUM (PORCINE) 1000 UNIT/ML IJ SOLN
INTRAMUSCULAR | Status: DC | PRN
  Administered 2018-12-24: 6000 [IU] via INTRAVENOUS

## 2018-12-24 MED ORDER — ACETAMINOPHEN 325 MG PO TABS: 650.0000 mg | ORAL_TABLET | ORAL | Status: DC | PRN

## 2018-12-24 SURGICAL SUPPLY — 46 items
BALLN LUTONIX 6X220X130 (BALLOONS) ×3
BALLN LUTONIX AV 7X60X75 (BALLOONS) ×3
BALLN LUTONIX DCB 4X60X130 (BALLOONS) ×3
BALLN LUTONIX DCB 6X80X130 (BALLOONS) ×3
BALLN LUTONIX DCB 7X40X130 (BALLOONS) ×3
BALLN STERLING OTW 3X100X150 (BALLOONS) ×3
BALLN STERLING OTW 6X60X135 (BALLOONS) ×3
BALLN ULTRASCORE 4X40X130 (BALLOONS) ×3
BALLN ULTRASCORE 5X100X130 (BALLOONS) ×3
BALLOON LUTONIX 6X220X130 (BALLOONS) ×1 IMPLANT
BALLOON LUTONIX AV 7X60X75 (BALLOONS) IMPLANT
BALLOON LUTONIX DCB 4X60X130 (BALLOONS) IMPLANT
BALLOON LUTONIX DCB 6X80X130 (BALLOONS) ×1 IMPLANT
BALLOON LUTONIX DCB 7X40X130 (BALLOONS) ×1 IMPLANT
BALLOON STERLING OTW 3X100X150 (BALLOONS) ×1 IMPLANT
BALLOON STERLING OTW 6X60X135 (BALLOONS) ×1 IMPLANT
BALLOON ULTRASCORE 4X40X130 (BALLOONS) ×1 IMPLANT
BALLOON ULTRASCORE 5X100X130 (BALLOONS) ×1 IMPLANT
CATH BEACON 5 .035 65 RIM TIP (CATHETERS) ×3 IMPLANT
CATH BEACON 5 .038 100 VERT TP (CATHETERS) ×3 IMPLANT
CATH CXI SUPP ST 4FR 90CM (CATHETERS) ×3 IMPLANT
CATH G XP .038X100 (CATHETERS) ×2 IMPLANT
CATH PIG 70CM (CATHETERS) ×3 IMPLANT
CATH SEEKER .035X150CM (CATHETERS) ×3 IMPLANT
DEVICE PRESTO INFLATION (MISCELLANEOUS) ×3 IMPLANT
DEVICE STARCLOSE SE CLOSURE (Vascular Products) ×2 IMPLANT
DEVICE TORQUE .025-.038 (MISCELLANEOUS) ×3 IMPLANT
GLIDECATH 4FR STR (CATHETERS) ×3 IMPLANT
GLIDEWIRE ADV .035X260CM (WIRE) ×3 IMPLANT
GLIDEWIRE STIFF .35X180X3 HYDR (WIRE) ×3 IMPLANT
GUIDEWIRE ANGLED .035 180CM (WIRE) ×3 IMPLANT
GUIDEWIRE PFTE-COATED .018X300 (WIRE) ×3 IMPLANT
LIFESTENT SOLO 7X200X135 (Permanent Stent) ×3 IMPLANT
NEEDLE ENTRY 21GA 7CM ECHOTIP (NEEDLE) ×3 IMPLANT
PACK ANGIOGRAPHY (CUSTOM PROCEDURE TRAY) ×3 IMPLANT
SET INTRO CAPELLA COAXIAL (SET/KITS/TRAYS/PACK) ×3 IMPLANT
SHEATH ANL2 6FRX45 HC (SHEATH) ×3 IMPLANT
SHEATH BRITE TIP 5FRX11 (SHEATH) ×3 IMPLANT
SHEATH BRITE TIP 6FRX11 (SHEATH) ×3 IMPLANT
SHEATH HIGHFLEX ANSEL 6FRX55 (SHEATH) ×3 IMPLANT
STENT LIFESTENT 5F 7X80X135 (Permanent Stent) ×3 IMPLANT
SYR MEDRAD MARK 7 150ML (SYRINGE) ×3 IMPLANT
TUBING CONTRAST HIGH PRESS 72 (TUBING) ×2 IMPLANT
WIRE G V18X300CM (WIRE) ×3 IMPLANT
WIRE J 3MM .035X145CM (WIRE) ×2 IMPLANT
WIRE MAGIC TORQUE 315CM (WIRE) ×2 IMPLANT

## 2018-12-24 NOTE — H&P (Signed)
[facility] History & Physical Update  The patient was interviewed and re-examined.  The patient's previous History and Physical has been reviewed and is unchanged.  There is no change in the plan of care. We plan to proceed with the scheduled procedure.  Hortencia Pilar, MD  12/24/2018, 2:38 PM

## 2018-12-24 NOTE — Discharge Instructions (Signed)
Femoral Site Care This sheet gives you information about how to care for yourself after your procedure. Your health care provider may also give you more specific instructions. If you have problems or questions, contact your health care provider. What can I expect after the procedure? After the procedure, it is common to have:  Bruising that usually fades within 1-2 weeks.  Tenderness at the site. Follow these instructions at home: Wound care  Follow instructions from your health care provider about how to take care of your insertion site. Make sure you: ? Wash your hands with soap and water before you change your bandage (dressing). If soap and water are not available, use hand sanitizer. ? Change your dressing as told by your health care provider. ? Leave stitches (sutures), skin glue, or adhesive strips in place. These skin closures may need to stay in place for 2 weeks or longer. If adhesive strip edges start to loosen and curl up, you may trim the loose edges. Do not remove adhesive strips completely unless your health care provider tells you to do that.  Do not take baths, swim, or use a hot tub until your health care provider approves.  You may shower 24-48 hours after the procedure or as told by your health care provider. ? Gently wash the site with plain soap and water. ? Pat the area dry with a clean towel. ? Do not rub the site. This may cause bleeding.  Do not apply powder or lotion to the site. Keep the site clean and dry.  Check your femoral site every day for signs of infection. Check for: ? Redness, swelling, or pain. ? Fluid or blood. ? Warmth. ? Pus or a bad smell. Activity  For the first 2-3 days after your procedure, or as long as directed: ? Avoid climbing stairs as much as possible. ? Do not squat.  Do not lift anything that is heavier than 10 lb (4.5 kg), or the limit that you are told, until your health care provider says that it is safe.  Rest as  directed. ? Avoid sitting for a long time without moving. Get up to take short walks every 1-2 hours.  Do not drive for 24 hours if you were given a medicine to help you relax (sedative). General instructions  Take over-the-counter and prescription medicines only as told by your health care provider.  Keep all follow-up visits as told by your health care provider. This is important. Contact a health care provider if you have:  A fever or chills.  You have redness, swelling, or pain around your insertion site. Get help right away if:  The catheter insertion area swells very fast.  You pass out.  You suddenly start to sweat or your skin gets clammy.  The catheter insertion area is bleeding, and the bleeding does not stop when you hold steady pressure on the area.  The area near or just beyond the catheter insertion site becomes pale, cool, tingly, or numb. These symptoms may represent a serious problem that is an emergency. Do not wait to see if the symptoms will go away. Get medical help right away. Call your local emergency services (911 in the [place] Do not drive yourself to the hospital. Summary  After the procedure, it is common to have bruising that usually fades within 1-2 weeks.  Check your femoral site every day for signs of infection.  Do not lift anything that is heavier than 10 lb (4.5 kg), or the   limit that you are told, until your health care provider says that it is safe. °This information is not intended to replace advice given to you by your health care provider. Make sure you discuss any questions you have with your health care provider. °Document Released: 08/29/2013 Document Revised: 01/08/2017 Document Reviewed: 01/08/2017 °Elsevier Patient Education © 2020 [employer] ° ° ° °Angiogram, Care After °This sheet gives you information about how to care for yourself after your procedure. Your doctor may also give you more specific instructions. If you have problems or  questions, contact your doctor. °Follow these instructions at home: °Insertion site care °· Follow instructions from your doctor about how to take care of your long, thin tube (catheter) insertion area. Make sure you: °? Wash your hands with soap and water before you change your bandage (dressing). If you cannot use soap and water, use hand sanitizer. °? Change your bandage as told by your doctor. °? Leave stitches (sutures), skin glue, or skin tape (adhesive) strips in place. They may need to stay in place for 2 weeks or longer. If tape strips get loose and curl up, you may trim the loose edges. Do not remove tape strips completely unless your doctor says it is okay. °· Do not take baths, swim, or use a hot tub until your doctor says it is okay. °· You may shower 24-48 hours after the procedure or as told by your doctor. °? Gently wash the area with plain soap and water. °? Pat the area dry with a clean towel. °? Do not rub the area. This may cause bleeding. °· Do not apply powder or lotion to the area. Keep the area clean and dry. °· Check your insertion area every day for signs of infection. Check for: °? More redness, swelling, or pain. °? Fluid or blood. °? Warmth. °? Pus or a bad smell. °Activity °· Rest as told by your doctor, usually for 1-2 days. °· Do not lift anything that is heavier than 10 lbs. (4.5 kg) or as told by your doctor. °· Do not drive for 24 hours if you were given a medicine to help you relax (sedative). °· Do not drive or use heavy machinery while taking prescription pain medicine. °General instructions ° °· Go back to your normal activities as told by your doctor, usually in about a week. Ask your doctor what activities are safe for you. °· If the insertion area starts to bleed, lie flat and put pressure on the area. If the bleeding does not stop, get help right away. This is an emergency. °· Drink enough fluid to keep your pee (urine) clear or pale yellow. °· Take over-the-counter and  prescription medicines only as told by your doctor. °· Keep all follow-up visits as told by your doctor. This is important. °Contact a doctor if: °· You have a fever. °· You have chills. °· You have more redness, swelling, or pain around your insertion area. °· You have fluid or blood coming from your insertion area. °· The insertion area feels warm to the touch. °· You have pus or a bad smell coming from your insertion area. °· You have more bruising around the insertion area. °· Blood collects in the tissue around the insertion area (hematoma) that may be painful to the touch. °Get help right away if: °· You have a lot of pain in the insertion area. °· The insertion area swells very fast. °· The insertion area is bleeding, and the bleeding   does not stop after holding steady pressure on the area. °· The area near or just beyond the insertion area becomes pale, cool, tingly, or numb. °These symptoms may be an emergency. Do not wait to see if the symptoms will go away. Get medical help right away. Call your local emergency services (911 in the [place] Do not drive yourself to the hospital. °Summary °· After the procedure, it is common to have bruising and tenderness at the long, thin tube insertion area. °· After the procedure, it is important to rest and drink plenty of fluids. °· Do not take baths, swim, or use a hot tub until your doctor says it is okay to do so. You may shower 24-48 hours after the procedure or as told by your doctor. °· If the insertion area starts to bleed, lie flat and put pressure on the area. If the bleeding does not stop, get help right away. This is an emergency. °This information is not intended to replace advice given to you by your health care provider. Make sure you discuss any questions you have with your health care provider. °Document Released: 03/24/2008 Document Revised: 12/08/2016 Document Reviewed: 12/21/2015 °Elsevier Patient Education © 2020 [employer] ° ° ° °Moderate  Conscious Sedation, Adult, Care After °These instructions provide you with information about caring for yourself after your procedure. Your health care provider may also give you more specific instructions. Your treatment has been planned according to current medical practices, but problems sometimes occur. Call your health care provider if you have any problems or questions after your procedure. °What can I expect after the procedure? °After your procedure, it is common: °· To feel sleepy for several hours. °· To feel clumsy and have poor balance for several hours. °· To have poor judgment for several hours. °· To vomit if you eat too soon. °Follow these instructions at home: °For at least 24 hours after the procedure: ° °· Do not: °? Participate in activities where you could fall or become injured. °? Drive. °? Use heavy machinery. °? Drink alcohol. °? Take sleeping pills or medicines that cause drowsiness. °? Make important decisions or sign legal documents. °? Take care of children on your own. °· Rest. °Eating and drinking °· Follow the diet recommended by your health care provider. °· If you vomit: °? Drink water, juice, or soup when you can drink without vomiting. °? Make sure you have little or no nausea before eating solid foods. °General instructions °· Have a responsible adult stay with you until you are awake and alert. °· Take over-the-counter and prescription medicines only as told by your health care provider. °· If you smoke, do not smoke without supervision. °· Keep all follow-up visits as told by your health care provider. This is important. °Contact a health care provider if: °· You keep feeling nauseous or you keep vomiting. °· You feel light-headed. °· You develop a rash. °· You have a fever. °Get help right away if: °· You have trouble breathing. °This information is not intended to replace advice given to you by your health care provider. Make sure you discuss any questions you have with your  health care provider. °Document Released: 10/16/2012 Document Revised: 12/08/2016 Document Reviewed: 04/17/2015 °Elsevier Patient Education © 2020 [employer] Inc. ° °

## 2018-12-24 NOTE — Op Note (Signed)
[facility] Percutaneous Study/Intervention Procedural Note   Date of Surgery: 12/24/2018  Surgeon:  Katha Cabal, MD.  Pre-operative Diagnosis: Atherosclerotic occlusive disease bilateral lower extremities with ulceration of the left foot  Post-operative diagnosis: Same  Procedure(s) Performed: 1. Introduction catheter into left lower extremity 3rd order catheter placement  2. Contrast injection left lower extremity for distal runoff   3. Percutaneous transluminal angioplasty and stent placement left superficial femoral artery and popliteal 4. Percutaneous transluminal angioplasty of the left anterior tibial artery to 4 mm at its origin and 3 mm in its proximal one third.             5.  Star close closure right common femoral arteriotomy  Anesthesia: Conscious sedation was administered under my direct supervision by the interventional radiology RN. IV Versed plus fentanyl were utilized. Continuous ECG, pulse oximetry and blood pressure was monitored throughout the entire procedure.  Conscious sedation was for a total of 125 minutes.  Sheath: 6 [place] Ansell right common femoral retrograde  Contrast: 140 cc  Fluoroscopy Time: 32.7 minutes  Indications: Dylan Robles presents with ulceration of the left foot.  Physical examination as well as noninvasive studies demonstrated significant atherosclerotic occlusive disease.  This places the patient at risk for limb loss.  The risks and benefits are reviewed all questions answered patient agrees to proceed.  Procedure: Dylan Robles is a 78 y.o. y.o. male who was identified and appropriate procedural time out was performed. The patient was then placed supine on the table and prepped and draped in the usual sterile fashion.   Ultrasound was placed in the sterile sleeve and the right groin was evaluated the right common femoral artery was echolucent and  pulsatile indicating patency.  Image was recorded for the permanent record and under real-time visualization a microneedle was inserted into the common femoral artery microwire followed by a micro-sheath.  A J-wire was then advanced through the micro-sheath and a  5 [place] sheath was then inserted over a J-wire. J-wire was then advanced and a 5 French pigtail catheter was positioned at the level of T12. AP projection of the aorta was then obtained. Pigtail catheter was repositioned to above the bifurcation and a RAO view of the pelvis was obtained.    Given the anatomy of his bifurcation crossing it was quite challenging.  The majority of the fluoroscopy time for this case was actually used crossing the bifurcation.  A rim catheter was used to advance a floppy Glidewire down into the SFA.  Ultimately, a CXI catheter and then a V 18 wire was advanced.  A Ansell sheath 6 [place] with an 0.018 dilator did track and the Ansell sheath was positioned with its tip in the proximal common femoral.  Oblique view of the femoral bifurcation was then obtained and subsequently the wire was reintroduced and the pigtail catheter negotiated into the SFA representing third order catheter placement. Distal runoff was then performed.  Diagnostic interpretation: The abdominal aorta is opacified the bolus injection contrast.  There are no hemodynamically significant stenosis.  There appears to be moderate renal artery stenosis on the left no renal artery stenosis on the right.  The aortic bifurcation with the common iliac arteries are widely patent but the iliac arteries are nearly vertical very similar in an anatomical configuration to a raised bifurcation after stents have been placed.  However, there are no stents here this is his native arterial system.  This is what created such  a difficult bifurcation across.  The left common and external iliac arteries widely patent.  Left common femoral and profunda femoris are widely  patent.  Superficial femoral artery is widely patent in its proximal two thirds.  At [facility] canal extending through the entire length of the popliteal down to the trifurcation there are multiple greater than 80% lesions noted at least 5 or 6 in series.  The anterior tibial demonstrates a string sign at its origin and then an occlusion approximately 2 to 3 cm distal to the origin.  It is reconstituted in its midportion.  The tibioperoneal trunk is patent but heavily diseased but there is no evidence of opacification of the posterior tibial or peroneal throughout their entire lengths.  6000 units of heparin was then given and allowed to circulate and as noted above a 6 [place] Ansell sheath was advanced up and over the bifurcation and positioned in the left common femoral artery  KMP  catheter and stiff angle Glidewire were then negotiated down into the distal popliteal.  Distal runoff was then completed by hand injection through the catheter. The wire was then reintroduced and using a seeker catheter and combination of the 035 advantage as well as 018 advantage wire the anterior tibial occlusion was crossed.  Initially I treated the anterior tibia so that we would have distal flow and a 3 mm x 100 mm Ultraverse balloon was used to angioplasty the proximal one third of the anterior tibial.  Attention was then turned to the SFA and a 5 mm x 100 mm ultra score balloon was advanced down to the distal popliteal 3 separate inflations were made at extending upward to Hunter's canal.  Follow-up imaging demonstrated greater than 40% residual stenosis and to life stents were deployed a 7 mm x 200 followed by a 7 mm x 180.  These were then posted with 6 mm Lutonix balloons inflated to 14 atm for 1 minute each.  Follow-up imaging demonstrated less than 15% residual stenosis throughout the entire length.  And attention was again turned to the anterior tibial which still had a greater than 50% stenosis at its origin.  A 4  mm x 40 mm ultra score balloon was advanced across the origin of the anterior tibial inflated to 16 atm for 1 minute.  Following this a 4 mm x 60 mm Lutonix balloon was advanced across this region and inflated to 12 atm for 1 minute.  Follow-up imaging demonstrated less than 20% residual stenosis throughout the entire length of the anterior tibial.  After review of these images the sheath is pulled into the right external iliac oblique of the common femoral is obtained and a Star close device deployed. There no immediate complications.   Findings:  The abdominal aorta is opacified the bolus injection contrast.  There are no hemodynamically significant stenosis.  There appears to be moderate renal artery stenosis on the left no renal artery stenosis on the right.  The aortic bifurcation with the common iliac arteries are widely patent but the iliac arteries are nearly vertical very similar in an anatomical configuration to a raised bifurcation after stents have been placed.  However, there are no stents here this is his native arterial system.  This is what created such a difficult bifurcation across.  The left common and external iliac arteries widely patent.  Left common femoral and profunda femoris are widely patent.  Superficial femoral artery is widely patent in its proximal two thirds.  At [facility] canal extending  through the entire length of the popliteal down to the trifurcation there are multiple greater than 80% lesions noted at least 5 or 6 in series.  The anterior tibial demonstrates a string sign at its origin and then an occlusion approximately 2 to 3 cm distal to the origin.  It is reconstituted in its midportion.  The tibioperoneal trunk is patent but heavily diseased but there is no evidence of opacification of the posterior tibial or peroneal throughout their entire lengths.  Following angioplasty anterior tibial now is in-line flow and looks quite nice with less than 15% residual  stenosis at its ostia. Angioplasty and stent placement of the SFA at Hunter's canal and the popliteal throughout its entire length yields an excellent result with less than 10% residual stenosis.    Summary: Successful recanalization left lower extremity for limb salvage    Disposition: Patient was taken to the recovery room in stable condition having tolerated the procedure well.  Anacaren Kohan, Dolores Lory 12/24/2018,4:51 PM

## 2018-12-25 ENCOUNTER — Encounter: Payer: Self-pay | Admitting: Cardiology

## 2018-12-30 ENCOUNTER — Encounter: Payer: Self-pay | Admitting: Podiatry

## 2018-12-30 ENCOUNTER — Ambulatory Visit: Payer: Medicare HMO | Admitting: Podiatry

## 2018-12-30 ENCOUNTER — Other Ambulatory Visit: Payer: Self-pay

## 2018-12-30 DIAGNOSIS — E1142 Type 2 diabetes mellitus with diabetic polyneuropathy: Secondary | ICD-10-CM | POA: Diagnosis not present

## 2018-12-30 DIAGNOSIS — L03032 Cellulitis of left toe: Secondary | ICD-10-CM | POA: Diagnosis not present

## 2018-12-30 DIAGNOSIS — L97521 Non-pressure chronic ulcer of other part of left foot limited to breakdown of skin: Secondary | ICD-10-CM

## 2018-12-30 DIAGNOSIS — I739 Peripheral vascular disease, unspecified: Secondary | ICD-10-CM

## 2018-12-30 MED ORDER — DOXYCYCLINE HYCLATE 100 MG PO TABS: 100.0000 mg | ORAL_TABLET | Freq: Two times a day (BID) | ORAL | 0 refills | Status: DC

## 2018-12-30 NOTE — Progress Notes (Addendum)
This patient returns to the office for continued evaluation of the paronychia left big toe weeks ago.  The tissue on the top of the big toe has started draining with possible pus noted from the toe.  Previously this toe had been doing well but due to his diabetes and his PVD it was healing slowly.  He was seen by his vascular surgeon who performed vascular surgery and a stent was introduced into his left leg.  The patient states that the surgeon is very pleased with his progress.  Patient also has an ulcer on the outside of his left foot which the patient and his wife believe is healing.  He is also concerned about a skin fissure that has developed on the back of his left foot.  He presents the office today for an evaluation and treatment of his left foot.  This patient states that he has stopped having severe pain at night during sleep since the vascular surgery was performed.  He presents the office today for continued evaluation and treatment of his left foot.  His surgery was performed by Dr. Franchot Gallo on 12/24/2018.    General Appearance  Alert, conversant and in no acute stress.  Vascular  Dorsalis pedis and posterior tibial  pulses are weakly  palpable  bilaterally.  Capillary return is diminished   bilaterally. Temperature is diminished   bilaterally.  Neurologic  Senn-Weinstein monofilament wire test diminished right foot.  Absent LOPS left hallux.   Muscle power within normal limits bilaterally.  Nails Thick disfigured discolored nails with subungual debris  from hallux to fifth toes bilaterally. No evidence of bacterial infection or drainage bilaterally. The hallux nail was removed revealing a subungual ulcer left hallux.  Bone from distal phalanx is exposed now that the necrotic tissue was removed.  Using a nail nipper thethe bone was probed and found to be hard and intact.  Orthopedic  No limitations of motion  feet .  No crepitus or effusions noted.  No bony pathology or digital deformities  noted.  Skin  normotropic skin with no porokeratosis noted bilaterally.  An ulcer measuring 15 mm x 10 mm is noted at the dorsal lateral aspect of the fifth metatarsal head left foot.  No evidence of any redness swelling or infection.  Ulcer left hallux  Ulcer lateral aspect left foot.    ROV.  Evaluation of his left foot does reveal the presence of weakly palpable pulses on his left foot.  The ulcer on the outside of the left foot is bandaged with Silvadene dry sterile dressing.  The subungual ulcer is noted on the left hallux after debridement of necrotic tissue from the nailbed and nail site.  This site was bandaged with Silvadene dry dry sterile dressing.  The ulcer on the outside of the left foot should heal nicely now that the surgery has been performed.  I am concerned about the ulcer on the left hallux so I prescribed oxacillin 100 mg 1 twice daily to be taken for the next 10 days.  I am interested in keeping an eye on his left hallux to determine if his toe will demarcate with the improvement of the blood flow to his left foot.  Patient was told to soak his toe and take the antibiotics and return to the office in 2 weeks.  He was also given a heel pillow in an effort to help to prevent any breakdown of the skin on the posterior aspect of the left heel.  Return  to the clinic 2 weeks for further evaluation and treatment.   [name] DPM

## 2019-01-13 ENCOUNTER — Encounter: Payer: Self-pay | Admitting: Podiatry

## 2019-01-13 ENCOUNTER — Ambulatory Visit: Payer: Medicare HMO | Admitting: Podiatry

## 2019-01-13 ENCOUNTER — Other Ambulatory Visit: Payer: Self-pay

## 2019-01-13 DIAGNOSIS — L03032 Cellulitis of left toe: Secondary | ICD-10-CM

## 2019-01-13 DIAGNOSIS — I739 Peripheral vascular disease, unspecified: Secondary | ICD-10-CM | POA: Diagnosis not present

## 2019-01-13 DIAGNOSIS — L97521 Non-pressure chronic ulcer of other part of left foot limited to breakdown of skin: Secondary | ICD-10-CM

## 2019-01-13 DIAGNOSIS — E1142 Type 2 diabetes mellitus with diabetic polyneuropathy: Secondary | ICD-10-CM

## 2019-01-13 NOTE — Progress Notes (Signed)
This patient returns to the office for continued evaluation of a paronychia on his left hallux as well as a diabetic ulcer on the lateral aspect fifth metatarsal left foot. He has had vascular surgery and a stent was introduced into his left leg in early December.  Patient was evaluated 2 weeks ago and appeared to have a breakdown in an active infection in his left hallux.  He was treated with doxycycline 100 mg and presents the office today stating the toe is improving and he is having minimal pain and discomfort.  He says he still has drainage noted on the outside of his left foot for which he has been soaking.  He also has been using a heel pillow to help cushion the left heel/foot.  He presents the office today for continued evaluation and treatment.  He presents the office with his wife.  General Appearance  Alert, conversant and in no acute stress.  Vascular  Dorsalis pedis and posterior tibial  pulses are weakly  palpable  bilaterally.  Capillary return is diminished   bilaterally. Temperature is diminished   bilaterally.  Neurologic  Senn-Weinstein monofilament wire test diminished   bilaterally. Muscle power within normal limits bilaterally.  Nails Thick disfigured discolored nails with subungual debris  from hallux to fifth toes bilaterally. No evidence of bacterial infection or drainage left hallux.  There is necrotic tissue noted at the level of the nail bed of the left hallux.  No evidence of any drainage or infection present.     Orthopedic  No limitations of motion  feet .  No crepitus or effusions noted.  No bony pathology or digital deformities noted.  Skin  normotropic skin with no porokeratosis noted bilaterally.  The ulcer on the lateral border of the left foot measures 15 mm x 10 mm with mild drainage noted at the dorsal lateral aspect of the fifth metatarsal left foot.  Ulcer paronychia left hallux.  Ulcer lateral aspect left foot.    ROV.  The  paronychia   left hallux seems to  be healing at today's visit .  Debride necrotic tissue fifth metatarsal shaft left foot.  Silvadene dry sterile dressing and Coban was used to bandage the left hallux.  Silvadene dry sterile dressing and bandage was used to bandage the ulcer on the outside of the left foot.  Talk to this patient and his wife.  Told them I would recommend sending him  to the wound care center in [place] to help finalize the healing of the ulcers left foot.  Angie was notified and send paperwork to the wound care center who will call this patient and schedule a future appointment.  Return to clinic as needed.    [name] DPM

## 2019-01-15 ENCOUNTER — Other Ambulatory Visit: Payer: Self-pay | Admitting: Internal Medicine

## 2019-01-15 ENCOUNTER — Other Ambulatory Visit (INDEPENDENT_AMBULATORY_CARE_PROVIDER_SITE_OTHER): Payer: Self-pay | Admitting: Vascular Surgery

## 2019-01-15 ENCOUNTER — Encounter: Payer: Medicare HMO | Attending: Internal Medicine | Admitting: Internal Medicine

## 2019-01-15 ENCOUNTER — Ambulatory Visit
Admission: RE | Admit: 2019-01-15 | Discharge: 2019-01-15 | Disposition: A | Discharge status: Home / Self Care | Payer: Medicare HMO | Source: Ambulatory Visit | Attending: Internal Medicine | Admitting: Internal Medicine

## 2019-01-15 ENCOUNTER — Other Ambulatory Visit: Payer: Self-pay

## 2019-01-15 DIAGNOSIS — S81802A Unspecified open wound, left lower leg, initial encounter: Secondary | ICD-10-CM | POA: Insufficient documentation

## 2019-01-15 DIAGNOSIS — I2510 Atherosclerotic heart disease of native coronary artery without angina pectoris: Secondary | ICD-10-CM | POA: Insufficient documentation

## 2019-01-15 DIAGNOSIS — I110 Hypertensive heart disease with heart failure: Secondary | ICD-10-CM | POA: Diagnosis not present

## 2019-01-15 DIAGNOSIS — L97524 Non-pressure chronic ulcer of other part of left foot with necrosis of bone: Secondary | ICD-10-CM | POA: Diagnosis not present

## 2019-01-15 DIAGNOSIS — I70245 Atherosclerosis of native arteries of left leg with ulceration of other part of foot: Secondary | ICD-10-CM

## 2019-01-15 DIAGNOSIS — Z95820 Peripheral vascular angioplasty status with implants and grafts: Secondary | ICD-10-CM

## 2019-01-15 DIAGNOSIS — L97528 Non-pressure chronic ulcer of other part of left foot with other specified severity: Secondary | ICD-10-CM | POA: Diagnosis not present

## 2019-01-15 DIAGNOSIS — I509 Heart failure, unspecified: Secondary | ICD-10-CM | POA: Insufficient documentation

## 2019-01-15 DIAGNOSIS — E1142 Type 2 diabetes mellitus with diabetic polyneuropathy: Secondary | ICD-10-CM | POA: Diagnosis not present

## 2019-01-15 DIAGNOSIS — N400 Enlarged prostate without lower urinary tract symptoms: Secondary | ICD-10-CM | POA: Diagnosis not present

## 2019-01-15 DIAGNOSIS — E785 Hyperlipidemia, unspecified: Secondary | ICD-10-CM | POA: Diagnosis not present

## 2019-01-15 DIAGNOSIS — E1151 Type 2 diabetes mellitus with diabetic peripheral angiopathy without gangrene: Secondary | ICD-10-CM | POA: Diagnosis not present

## 2019-01-15 DIAGNOSIS — E11621 Type 2 diabetes mellitus with foot ulcer: Secondary | ICD-10-CM | POA: Diagnosis present

## 2019-01-15 NOTE — Progress Notes (Signed)
[name], [name] ([id number]) Visit Report for 01/15/2019 Chief Complaint Document Details Patient Name: Dylan Robles, Dylan Robles. Date of Service: 01/15/2019 1:15 PM Medical Record Number: [id number] Patient Account Number: [id number] Date of Birth/Sex: 25-Mar-1940 (79 y.o. M) Treating RN: Cornell Barman Primary Care Provider: Lucia Gaskins Other Clinician: Referring Provider: [name] Treating Provider/Extender: Tito Dine in Treatment: 0 Information Obtained from: Patient Chief Complaint 01/15/2019; patient is here for review of 2 wounds on his left foot Electronic Signature(s) Signed: 01/15/2019 4:45:14 PM By: Linton Ham MD Entered By: Linton Ham on 01/15/2019 14:48:07 [name] ([id number]) -------------------------------------------------------------------------------- Debridement Details Patient Name: Dylan Robles, Dylan Robles. Date of Service: 01/15/2019 1:15 PM Medical Record Number: [id number] Patient Account Number: [id number] Date of Birth/Sex: 02-03-1940 (79 y.o. M) Treating RN: Cornell Barman Primary Care Provider: Lucia Gaskins Other Clinician: Referring Provider: [name] Treating Provider/Extender: Tito Dine in Treatment: 0 Debridement Performed for Wound #1 Left,Dorsal Toe Great Assessment: Performed By: Physician Ricard Dillon, MD Debridement Type: Debridement Severity of Tissue Pre Fat layer exposed Debridement: Level of Consciousness (Pre- Awake and Alert procedure): Pre-procedure Verification/Time Yes - 14:20 Out Taken: Start Time: 14:20 Pain Control: Lidocaine Total Area Debrided (L x W): 0.7 (cm) x 1.2 (cm) = 0.84 (cm) Tissue and other material Viable, Non-Viable, Subcutaneous, Skin: Epidermis debrided: Level: Skin/Subcutaneous Tissue Debridement Description: Excisional Instrument: Curette Bleeding: Minimum Hemostasis Achieved: Pressure End Time: 14:27 Response to Treatment: Procedure was tolerated well Level of  Consciousness Awake and Alert (Post-procedure): Post Debridement Measurements of Total Wound Length: (cm) 0.7 Width: (cm) 1.2 Depth: (cm) 0.3 Volume: (cm) 0.198 Character of Wound/Ulcer Post Debridement: Requires Further Debridement Severity of Tissue Post Debridement: Fat layer exposed Post Procedure Diagnosis Same as Pre-procedure Electronic Signature(s) Signed: 01/15/2019 4:45:14 PM By: Linton Ham MD Signed: 01/15/2019 4:51:02 PM By: Gretta Cool, BSN, RN, CWS, Kim RN, BSN Entered By: Gretta Cool, BSN, RN, CWS, Kim on 01/15/2019 14:27:37 [name] ([id number]) -------------------------------------------------------------------------------- Debridement Details Patient Name: Dylan Robles, Dylan Robles. Date of Service: 01/15/2019 1:15 PM Medical Record Number: [id number] Patient Account Number: [id number] Date of Birth/Sex: 09-22-1940 (79 y.o. M) Treating RN: Cornell Barman Primary Care Provider: Lucia Gaskins Other Clinician: Referring Provider: [name] Treating Provider/Extender: Tito Dine in Treatment: 0 Debridement Performed for Wound #2 Left,Lateral Foot Assessment: Performed By: Physician Ricard Dillon, MD Debridement Type: Debridement Severity of Tissue Pre Fat layer exposed Debridement: Level of Consciousness (Pre- Awake and Alert procedure): Pre-procedure Verification/Time Yes - 14:20 Out Taken: Start Time: 14:20 Pain Control: Lidocaine Total Area Debrided (L x W): 0.9 (cm) x 0.9 (cm) = 0.81 (cm) Tissue and other material Viable, Non-Viable, Slough, Subcutaneous, Skin: Epidermis, Slough debrided: Level: Skin/Subcutaneous Tissue Debridement Description: Excisional Instrument: Curette Bleeding: Minimum Hemostasis Achieved: Pressure End Time: 14:27 Response to Treatment: Procedure was tolerated well Level of Consciousness Awake and Alert (Post-procedure): Post Debridement Measurements of Total Wound Length: (cm) 0.9 Width: (cm) 0.9 Depth: (cm)  0.3 Volume: (cm) 0.191 Character of Wound/Ulcer Post Debridement: Requires Further Debridement Severity of Tissue Post Debridement: Fat layer exposed Post Procedure Diagnosis Same as Pre-procedure Electronic Signature(s) Signed: 01/15/2019 4:45:14 PM By: Linton Ham MD Signed: 01/15/2019 4:51:02 PM By: Gretta Cool, BSN, RN, CWS, Kim RN, BSN Entered By: Gretta Cool, BSN, RN, CWS, Kim on 01/15/2019 14:29:09 [name] ([id number]) -------------------------------------------------------------------------------- HPI Details Patient Name: Dylan Robles, Dylan Robles. Date of Service: 01/15/2019 1:15 PM Medical Record Number: [id number] Patient Account Number: [id number] Date of Birth/Sex: [birth year] (79 y.o. M) Treating RN: Cornell Barman  Primary Care Provider: Lucia Gaskins Other Clinician: Referring Provider: [name] Treating Provider/Extender: Tito Dine in Treatment: 0 History of Present Illness HPI Description: ADMISSION 01/15/2019 Patient is a pleasant 79 year old type II diabetic man who arrives accompanied by his wife from [place]. His story begins in early September 2020. He developed purulence from the corner of his left great toe. He saw his podiatrist Dr. Sharyon Cable who diagnosed a severe chronic paronychia. He had total avulsion of the nail because of the paronychia today on 9/17. He was followed weekly by Dr. Sharyon Cable developed a nonhealing area on the nailbed of the left great toe although his pain was improved. On 10/20/2018 he was noted to have a new wound on the left lateral foot to which she is was applying Neosporin. His dressings were changed to peroxide to the nailbed and Silvadene on 10/27. On 12/7 it was noted that he had had previous Dopplers ordered by his primary care doctor in August. These were really quite poor. ABI on the right was 0.72, 0.44 TBI. On the left 0.54 ABI with a TBI of 0.20. Monophasic waveforms bilaterally. He was referred to Dr. Delana Meyer. On  12/15 he underwent an angiogram and had a stent placed in the left superficial femoral artery along with angioplasty. Since then his pain is a lot better. He is still using Silvadene to the wounds. He was prescribed doxycycline about 2 weeks ago which he finished last week. Past medical history; type 2 diabetes, BPH, hypertension, coronary artery disease, hyperlipidemia and PAD ABIs were not obtainable in the clinic today. Electronic Signature(s) Signed: 01/15/2019 4:45:14 PM By: Linton Ham MD Entered By: Linton Ham on 01/15/2019 14:53:14 [name] ([id number]) -------------------------------------------------------------------------------- Physical Exam Details Patient Name: Dylan Robles, Dylan Robles. Date of Service: 01/15/2019 1:15 PM Medical Record Number: [id number] Patient Account Number: [id number] Date of Birth/Sex: Feb 28, 1940 (79 y.o. M) Treating RN: Cornell Barman Primary Care Provider: Lucia Gaskins Other Clinician: Referring Provider: [name] Treating Provider/Extender: Tito Dine in Treatment: 0 Constitutional Sitting or standing Blood Pressure is within target range for patient.. Pulse regular and within target range for patient.[name] Respirations regular, non-labored and within target range.. Temperature is normal and within the target range for the patient.[name] appears in no distress. Eyes Conjunctivae clear. No discharge. Respiratory Respiratory effort is easy and symmetric bilaterally. Rate is normal at rest and on room air.. Bilateral breath sounds are clear and equal in all lobes with no wheezes, rales or rhonchi.. Cardiovascular Heart rhythm and rate regular, without murmur or gallop. Jugular venous pressure is not elevated.. Popliteal pulses are palpable bilaterally but not vibrant. Femoral pulses similar. Pedal pulses absent bilaterally.. Integumentary (Hair, Skin) Changes of chronic venous insufficiency bilateral lower legs. Neurological Do site  station and light touch bilaterally. Psychiatric No evidence of depression, anxiety, or agitation. Calm, cooperative, and communicative. Appropriate interactions and affect.. Notes Wound exam oFirst wound is in the nailbed of the left great toe. This is a small open area however 100% slough covered. With a curette I removed some of this however he clearly has exposed bone. oOn the left lateral foot again 100% slough covered removed with a #3 curette I am still not able to get down to healthy tissue. This is just proximal to the metatarsal phalangeal joint. oI inspected his right foot although this is cold and without pulses there are no open wounds even between the toes Electronic Signature(s) Signed: 01/15/2019 4:45:14 PM By: Linton Ham MD Entered By:  Linton Ham on 01/15/2019 14:55:53 Weant, Dylan Newton ([id number]) -------------------------------------------------------------------------------- Physician Orders Details Patient Name: Dylan Robles, Dylan Robles. Date of Service: 01/15/2019 1:15 PM Medical Record Number: [id number] Patient Account Number: [id number] Date of Birth/Sex: Nov 28, 1940 (79 y.o. M) Treating RN: Cornell Barman Primary Care Provider: Lucia Gaskins Other Clinician: Referring Provider: [name] Treating Provider/Extender: Tito Dine in Treatment: 0 Verbal / Phone Orders: No Diagnosis Coding Wound Cleansing Wound #1 Left,Dorsal Toe Great o Clean wound with Normal Saline. o May Shower, gently pat wound dry prior to applying new dressing. Wound #2 Left,Lateral Foot o Clean wound with Normal Saline. o May Shower, gently pat wound dry prior to applying new dressing. Anesthetic (add to Medication List) Wound #1 Left,Dorsal Toe Great o Topical Lidocaine 4% cream applied to wound bed prior to debridement (In Clinic Only). Wound #2 Left,Lateral Foot o Topical Lidocaine 4% cream applied to wound bed prior to debridement (In Clinic Only). Primary  Wound Dressing Wound #1 Left,Dorsal Toe Great o Santyl Ointment Wound #2 Left,Lateral Foot o Santyl Ointment Secondary Dressing Wound #1 Left,Dorsal Toe Great o Gauze and Kerlix/Conform Wound #2 Left,Lateral Foot o Gauze and Kerlix/Conform Dressing Change Frequency Wound #1 Left,Dorsal Toe Great o Change dressing every day. Wound #2 Left,Lateral Foot o Change dressing every day. Follow-up Appointments Wound #1 Left,Dorsal Toe Great o Return Appointment in 1 week. Wound #2 Left,Lateral Foot o Return Appointment in 1 week. Dylan Robles, CRISSINGER ([id number]) Edema Control Wound #1 Left,Dorsal Toe Great o Elevate legs to the level of the heart and pump ankles as often as possible Wound #2 Left,Lateral Foot o Elevate legs to the level of the heart and pump ankles as often as possible Radiology o X-ray, foot - Left 3-view(great toe and lateral foot wounds) Patient Medications Allergies: Plavix Notifications Medication Indication Start End Santyl 01/15/2019 DOSE topical 250 unit/gram ointment - ointment topical to wound apply daily Electronic Signature(s) Signed: 01/15/2019 4:45:14 PM By: Linton Ham MD Signed: 01/15/2019 4:51:02 PM By: Gretta Cool, BSN, RN, CWS, Kim RN, BSN Previous Signature: 01/15/2019 2:37:27 PM Version By: Linton Ham MD Entered By: Gretta Cool, BSN, RN, CWS, Kim on 01/15/2019 16:21:35 Dylan Robles, Dylan Robles ([id number]) -------------------------------------------------------------------------------- Problem List Details Patient Name: Dylan Robles, Dylan Robles. Date of Service: 01/15/2019 1:15 PM Medical Record Number: [id number] Patient Account Number: [id number] Date of Birth/Sex: 1940/07/03 (79 y.o. M) Treating RN: Cornell Barman Primary Care Provider: Lucia Gaskins Other Clinician: Referring Provider: [name] Treating Provider/Extender: Tito Dine in Treatment: 0 Active Problems ICD-10 Evaluated Encounter Code Description Active Date Today  Diagnosis E11.621 Type 2 diabetes mellitus with foot ulcer 01/15/2019 No Yes E11.51 Type 2 diabetes mellitus with diabetic peripheral angiopathy 01/15/2019 No Yes without gangrene L97.524 Non-pressure chronic ulcer of other part of left foot with 01/15/2019 No Yes necrosis of bone L97.528 Non-pressure chronic ulcer of other part of left foot with other 01/15/2019 No Yes specified severity E11.42 Type 2 diabetes mellitus with diabetic polyneuropathy 01/15/2019 No Yes Inactive Problems Resolved Problems Electronic Signature(s) Signed: 01/15/2019 4:45:14 PM By: Linton Ham MD Entered By: Linton Ham on 01/15/2019 14:39:30 [name] ([id number]) -------------------------------------------------------------------------------- Progress Note Details Patient Name: Dylan Robles, Dylan Robles. Date of Service: 01/15/2019 1:15 PM Medical Record Number: [id number] Patient Account Number: [id number] Date of Birth/Sex: 1940-01-10 (79 y.o. M) Treating RN: Cornell Barman Primary Care Provider: Lucia Gaskins Other Clinician: Referring Provider: [name] Treating Provider/Extender: Tito Dine in Treatment: 0 Subjective Chief Complaint Information obtained from Patient 01/15/2019; patient is here for review  of 2 wounds on his left foot History of Present Illness (HPI) ADMISSION 01/15/2019 Patient is a pleasant 79 year old type II diabetic man who arrives accompanied by his wife from [place]. His story begins in early September 2020. He developed purulence from the corner of his left great toe. He saw his podiatrist Dr. Sharyon Cable who diagnosed a severe chronic paronychia. He had total avulsion of the nail because of the paronychia today on 9/17. He was followed weekly by Dr. Sharyon Cable developed a nonhealing area on the nailbed of the left great toe although his pain was improved. On 10/20/2018 he was noted to have a new wound on the left lateral foot to which she is was applying Neosporin.  His dressings were changed to peroxide to the nailbed and Silvadene on 10/27. On 12/7 it was noted that he had had previous Dopplers ordered by his primary care doctor in August. These were really quite poor. ABI on the right was 0.72, 0.44 TBI. On the left 0.54 ABI with a TBI of 0.20. Monophasic waveforms bilaterally. He was referred to Dr. Delana Meyer. On 12/15 he underwent an angiogram and had a stent placed in the left superficial femoral artery along with angioplasty. Since then his pain is a lot better. He is still using Silvadene to the wounds. He was prescribed doxycycline about 2 weeks ago which he finished last week. Past medical history; type 2 diabetes, BPH, hypertension, coronary artery disease, hyperlipidemia and PAD ABIs were not obtainable in the clinic today. Patient History Information obtained from Patient. Allergies Plavix (Severity: Moderate, Reaction: Hives) Family History Cancer - Mother, Diabetes - Father,Siblings, Hypertension - Father, No family history of Heart Disease, Hereditary Spherocytosis, Kidney Disease, Lung Disease, Seizures, Stroke, Thyroid Problems, Tuberculosis. Social History Never smoker, Marital Status - Married, Alcohol Use - Never, Drug Use - No History, Caffeine Use - Rarely - sosda. Medical History Hematologic/Lymphatic Denies history of Anemia, Hemophilia, Human Immunodeficiency Virus, Lymphedema, Sickle Cell Disease Respiratory Denies history of Aspiration, Asthma, Chronic Obstructive Pulmonary Disease (COPD), Pneumothorax, Sleep Apnea, Tuberculosis Dylan Robles, Dylan Robles ([id number]) Cardiovascular Patient has history of Congestive Heart Failure, Hypertension Denies history of Angina, Arrhythmia, Coronary Artery Disease, Deep Vein Thrombosis, Hypotension, Myocardial Infarction, Peripheral Arterial Disease, Peripheral Venous Disease, Phlebitis, Vasculitis Gastrointestinal Denies history of Cirrhosis , Colitis, Crohn s, Hepatitis A, Hepatitis B,  Hepatitis C Endocrine Patient has history of Type II Diabetes Denies history of Type I Diabetes Genitourinary Denies history of End Stage Renal Disease Immunological Denies history of Lupus Erythematosus, Raynaud s, Scleroderma Integumentary (Skin) Denies history of History of Burn, History of pressure wounds Musculoskeletal Denies history of Gout, Rheumatoid Arthritis, Osteoarthritis, Osteomyelitis Neurologic Denies history of Dementia, Neuropathy, Quadriplegia, Paraplegia, Seizure Disorder Oncologic Denies history of Received Chemotherapy, Received Radiation Patient is treated with Insulin. Blood sugar is not tested. Review of Systems (ROS) Constitutional Symptoms (General Health) Denies complaints or symptoms of Fatigue, Fever, Chills, Marked Weight Change. Eyes Denies complaints or symptoms of Dry Eyes, Vision Changes, Glasses / Contacts. Ear/Nose/Mouth/Throat Denies complaints or symptoms of Difficult clearing ears, Sinusitis. Hematologic/Lymphatic Denies complaints or symptoms of Bleeding / Clotting Disorders, Human Immunodeficiency Virus. Respiratory Denies complaints or symptoms of Chronic or frequent coughs, Shortness of Breath. Cardiovascular Denies complaints or symptoms of Chest pain, LE edema. Gastrointestinal Denies complaints or symptoms of Frequent diarrhea, Nausea, Vomiting. Endocrine Complains or has symptoms of Thyroid disease - hypothyroidism. Denies complaints or symptoms of Polydypsia (Excessive Thirst). Genitourinary Denies complaints or symptoms of Kidney failure/ Dialysis, Incontinence/dribbling. Immunological  Denies complaints or symptoms of Hives, Itching. Integumentary (Skin) Complains or has symptoms of Wounds - two. Denies complaints or symptoms of Bleeding or bruising tendency, Breakdown, Swelling. Musculoskeletal Denies complaints or symptoms of Muscle Pain, Muscle Weakness. Neurologic Denies complaints or symptoms of Numbness/parasthesias,  Focal/Weakness. Psychiatric Denies complaints or symptoms of Anxiety, Claustrophobia. MYRL, ANNESE ([id number]) Objective Constitutional Sitting or standing Blood Pressure is within target range for patient.. Pulse regular and within target range for patient.[name] Respirations regular, non-labored and within target range.. Temperature is normal and within the target range for the patient.[name] appears in no distress. Vitals Time Taken: 1:25 PM, Height: 78 in, Source: Stated, Weight: 206 lbs, Source: Measured, BMI: 23.8, Temperature: 98.4 F, Pulse: 68 bpm, Respiratory Rate: 16 breaths/min, Blood Pressure: 119/55 mmHg. Eyes Conjunctivae clear. No discharge. Respiratory Respiratory effort is easy and symmetric bilaterally. Rate is normal at rest and on room air.. Bilateral breath sounds are clear and equal in all lobes with no wheezes, rales or rhonchi.. Cardiovascular Heart rhythm and rate regular, without murmur or gallop. Jugular venous pressure is not elevated.. Popliteal pulses are palpable bilaterally but not vibrant. Femoral pulses similar. Pedal pulses absent bilaterally.. Neurological Do site station and light touch bilaterally. Psychiatric No evidence of depression, anxiety, or agitation. Calm, cooperative, and communicative. Appropriate interactions and affect.. General Notes: Wound exam First wound is in the nailbed of the left great toe. This is a small open area however 100% slough covered. With a curette I removed some of this however he clearly has exposed bone. On the left lateral foot again 100% slough covered removed with a #3 curette I am still not able to get down to healthy tissue. This is just proximal to the metatarsal phalangeal joint. I inspected his right foot although this is cold and without pulses there are no open wounds even between the toes Integumentary (Hair, Skin) Changes of chronic venous insufficiency bilateral lower legs. Wound #1 status is Open. Original  cause of wound was Gradually Appeared. The wound is located on the [employer]. The wound measures 0.7cm length x 1.2cm width x 0.1cm depth; 0.66cm^2 area and 0.066cm^3 volume. There is Fat Layer (Subcutaneous Tissue) Exposed exposed. There is no tunneling or undermining noted. There is a medium amount of serous drainage noted. The wound margin is flat and intact. There is no granulation within the wound bed. There is a medium (34-66%) amount of necrotic tissue within the wound bed including Adherent Slough. Wound #2 status is Open. Original cause of wound was Gradually Appeared. The wound is located on the Left,Lateral Foot. The wound measures 0.9cm length x 0.9cm width x 0.2cm depth; 0.636cm^2 area and 0.127cm^3 volume. There is Fat Layer (Subcutaneous Tissue) Exposed exposed. There is no tunneling or undermining noted. There is a medium amount of serous drainage noted. The wound margin is flat and intact. There is small (1-33%) pale granulation within the wound bed. There is a medium (34-66%) amount of necrotic tissue within the wound bed including Adherent Slough. Assessment GERVASE, PETRICCA ([id number]) Active Problems ICD-10 Type 2 diabetes mellitus with foot ulcer Type 2 diabetes mellitus with diabetic peripheral angiopathy without gangrene Non-pressure chronic ulcer of other part of left foot with necrosis of bone Non-pressure chronic ulcer of other part of left foot with other specified severity Type 2 diabetes mellitus with diabetic polyneuropathy Procedures Wound #1 Pre-procedure diagnosis of Wound #1 is a Diabetic Wound/Ulcer of the Lower Extremity located on the Left,Dorsal Toe Great .Severity of Tissue  Pre Debridement is: Fat layer exposed. There was a Excisional Skin/Subcutaneous Tissue Debridement with a total area of 0.84 sq cm performed by Ricard Dillon, MD. With the following instrument(s): Curette to remove Viable and Non-Viable tissue/material. Material  removed includes Subcutaneous Tissue and Skin: Epidermis and after achieving pain control using Lidocaine. No specimens were taken. A time out was conducted at 14:20, prior to the start of the procedure. A Minimum amount of bleeding was controlled with Pressure. The procedure was tolerated well. Post Debridement Measurements: 0.7cm length x 1.2cm width x 0.3cm depth; 0.198cm^3 volume. Character of Wound/Ulcer Post Debridement requires further debridement. Severity of Tissue Post Debridement is: Fat layer exposed. Post procedure Diagnosis Wound #1: Same as Pre-Procedure Wound #2 Pre-procedure diagnosis of Wound #2 is a Diabetic Wound/Ulcer of the Lower Extremity located on the Left,Lateral Foot .Severity of Tissue Pre Debridement is: Fat layer exposed. There was a Excisional Skin/Subcutaneous Tissue Debridement with a total area of 0.81 sq cm performed by Ricard Dillon, MD. With the following instrument(s): Curette to remove Viable and Non-Viable tissue/material. Material removed includes Subcutaneous Tissue, Slough, and Skin: Epidermis after achieving pain control using Lidocaine. No specimens were taken. A time out was conducted at 14:20, prior to the start of the procedure. A Minimum amount of bleeding was controlled with Pressure. The procedure was tolerated well. Post Debridement Measurements: 0.9cm length x 0.9cm width x 0.3cm depth; 0.191cm^3 volume. Character of Wound/Ulcer Post Debridement requires further debridement. Severity of Tissue Post Debridement is: Fat layer exposed. Post procedure Diagnosis Wound #2: Same as Pre-Procedure Plan Wound Cleansing: Wound #1 Left,Dorsal Toe Great: Clean wound with Normal Saline. May Shower, gently pat wound dry prior to applying new dressing. Wound #2 Left,Lateral Foot: Clean wound with Normal Saline. May Shower, gently pat wound dry prior to applying new dressing. Anesthetic (add to Medication List): Wound #1 Left,Dorsal Toe  Great: Topical Lidocaine 4% cream applied to wound bed prior to debridement (In Clinic Only). BRISCO, BOLEYN ([id number]) Wound #2 Left,Lateral Foot: Topical Lidocaine 4% cream applied to wound bed prior to debridement (In Clinic Only). Primary Wound Dressing: Wound #1 Left,Dorsal Toe Great: Santyl Ointment Wound #2 Left,Lateral Foot: Santyl Ointment Secondary Dressing: Wound #1 Left,Dorsal Toe Great: Gauze and Kerlix/Conform Wound #2 Left,Lateral Foot: Gauze and Kerlix/Conform Dressing Change Frequency: Wound #1 Left,Dorsal Toe Great: Change dressing every day. Wound #2 Left,Lateral Foot: Change dressing every day. Follow-up Appointments: Wound #1 Left,Dorsal Toe Great: Return Appointment in 1 week. Wound #2 Left,Lateral Foot: Return Appointment in 1 week. Edema Control: Wound #1 Left,Dorsal Toe Great: Elevate legs to the level of the heart and pump ankles as often as possible Wound #2 Left,Lateral Foot: Elevate legs to the level of the heart and pump ankles as often as possible The following medication(s) was prescribed: Santyl topical 250 unit/gram ointment ointment topical to wound apply daily starting 01/15/2019 1. I prescribed Santyl to both wound areas. If this is unaffordable through [facility] then we will use Medihoney 2. The patient has been revascularized by Dr. Delana Meyer last month there has been some improvement. At this point I am uncertain whether there is enough blood flow to heal these wounds. He apparently follows up with him in the next week. Hopefully for repeat Dopplers to compare with the one in August/20 3. Because these are prolonged open wounds I went ahead and did a x-ray of the left foot. Particular consultation to the left great toe where he has exposed bone 4. The patient  is not in a lot of pain secondary to his angiopathy however he also has neuropathy which could blunt his pain. I spent 45 minutes on the preparation of this record review of his  records and examination the patient Electronic Signature(s) Signed: 01/15/2019 4:45:14 PM By: Linton Ham MD Entered By: Linton Ham on 01/15/2019 14:58:48 [name] ([id number]) -------------------------------------------------------------------------------- ROS/PFSH Details Patient Name: SAY, FRIPP. Date of Service: 01/15/2019 1:15 PM Medical Record Number: [id number] Patient Account Number: [id number] Date of Birth/Sex: April 12, 1940 (79 y.o. M) Treating RN: Harold Barban Primary Care Provider: Lucia Gaskins Other Clinician: Referring Provider: [name] Treating Provider/Extender: Tito Dine in Treatment: 0 Information Obtained From Patient Constitutional Symptoms (General Health) Complaints and Symptoms: Negative for: Fatigue; Fever; Chills; Marked Weight Change Eyes Complaints and Symptoms: Negative for: Dry Eyes; Vision Changes; Glasses / Contacts Ear/Nose/Mouth/Throat Complaints and Symptoms: Negative for: Difficult clearing ears; Sinusitis Hematologic/Lymphatic Complaints and Symptoms: Negative for: Bleeding / Clotting Disorders; Human Immunodeficiency Virus Medical History: Negative for: Anemia; Hemophilia; Human Immunodeficiency Virus; Lymphedema; Sickle Cell Disease Respiratory Complaints and Symptoms: Negative for: Chronic or frequent coughs; Shortness of Breath Medical History: Negative for: Aspiration; Asthma; Chronic Obstructive Pulmonary Disease (COPD); Pneumothorax; Sleep Apnea; Tuberculosis Cardiovascular Complaints and Symptoms: Negative for: Chest pain; LE edema Medical History: Positive for: Congestive Heart Failure; Hypertension Negative for: Angina; Arrhythmia; Coronary Artery Disease; Deep Vein Thrombosis; Hypotension; Myocardial Infarction; Peripheral Arterial Disease; Peripheral Venous Disease; Phlebitis; Vasculitis Gastrointestinal Complaints and Symptoms: Negative for: Frequent diarrhea; Nausea;  Vomiting Medical History: ALEKSEI, DEMBO ([id number]) Negative for: Cirrhosis ; Colitis; Crohnos; Hepatitis A; Hepatitis B; Hepatitis C Endocrine Complaints and Symptoms: Positive for: Thyroid disease - hypothyroidism Negative for: Polydypsia (Excessive Thirst) Medical History: Positive for: Type II Diabetes Negative for: Type I Diabetes Time with diabetes: 20 years Treated with: Insulin Blood sugar tested every day: No Genitourinary Complaints and Symptoms: Negative for: Kidney failure/ Dialysis; Incontinence/dribbling Medical History: Negative for: End Stage Renal Disease Immunological Complaints and Symptoms: Negative for: Hives; Itching Medical History: Negative for: Lupus Erythematosus; Raynaudos; Scleroderma Integumentary (Skin) Complaints and Symptoms: Positive for: Wounds - two Negative for: Bleeding or bruising tendency; Breakdown; Swelling Medical History: Negative for: History of Burn; History of pressure wounds Musculoskeletal Complaints and Symptoms: Negative for: Muscle Pain; Muscle Weakness Medical History: Negative for: Gout; Rheumatoid Arthritis; Osteoarthritis; Osteomyelitis Neurologic Complaints and Symptoms: Negative for: Numbness/parasthesias; Focal/Weakness Medical History: Negative for: Dementia; Neuropathy; Quadriplegia; Paraplegia; Seizure Disorder Psychiatric HOBBES, NAULT ([id number]) Complaints and Symptoms: Negative for: Anxiety; Claustrophobia Oncologic Medical History: Negative for: Received Chemotherapy; Received Radiation Immunizations Pneumococcal Vaccine: Received Pneumococcal Vaccination: No Implantable Devices None Family and Social History Cancer: Yes - Mother; Diabetes: Yes - Father,Siblings; Heart Disease: No; Hereditary Spherocytosis: No; Hypertension: Yes - Father; Kidney Disease: No; Lung Disease: No; Seizures: No; Stroke: No; Thyroid Problems: No; Tuberculosis: No; Never smoker; Marital Status - Married; Alcohol Use:  Never; Drug Use: No History; Caffeine Use: Rarely - sosda; Financial Concerns: No; Food, Clothing or Shelter Needs: No; Support System Lacking: No; Transportation Concerns: No Electronic Signature(s) Signed: 01/15/2019 3:59:27 PM By: Harold Barban Signed: 01/15/2019 4:45:14 PM By: Linton Ham MD Entered By: Harold Barban on 01/15/2019 13:43:59 PHILLIPP, WILEY ([id number]) -------------------------------------------------------------------------------- [place] Details Patient Name: JODIE, RIGLEY. Date of Service: 01/15/2019 Medical Record Number: [id number] Patient Account Number: [id number] Date of Birth/Sex: [birth year] (79 y.o. M) Treating RN: Cornell Barman Primary Care Provider: Lucia Gaskins Other Clinician: Referring Provider: [name] Treating Provider/Extender: Ricard Dillon Weeks in Treatment:  0 Diagnosis Coding ICD-10 Codes Code Description E11.621 Type 2 diabetes mellitus with foot ulcer E11.51 Type 2 diabetes mellitus with diabetic peripheral angiopathy without gangrene L97.524 Non-pressure chronic ulcer of other part of left foot with necrosis of bone L97.528 Non-pressure chronic ulcer of other part of left foot with other specified severity E11.42 Type 2 diabetes mellitus with diabetic polyneuropathy Facility Procedures CPT4 Code Description: [id number] - WOUND CARE VISIT-LEV 3 EST PT Modifier: Quantity: 1 CPT4 Code Description: [id number] - DEB SUBQ TISSUE 20 SQ CM/< ICD-10 Diagnosis Description L97.524 Non-pressure chronic ulcer of other part of left foot with necros L97.528 Non-pressure chronic ulcer of other part of left foot with other Modifier: is of bone specified sever Quantity: 1 ity Physician Procedures CPT4 Code Description: [id number] - WC PHYS LEVEL 4 - NEW PT ICD-10 Diagnosis Description L97.524 Non-pressure chronic ulcer of other part of left foot with necros L97.528 Non-pressure chronic ulcer of other part of left foot with other  E11.621 Type 2  diabetes mellitus with foot ulcer E11.51 Type 2 diabetes mellitus with diabetic peripheral angiopathy with Modifier: 25 is of bone specified severi out gangrene Quantity: 1 ty CPT4 Code Description: [id number] 11042 - WC PHYS SUBQ TISS 20 SQ CM ICD-10 Diagnosis Description L97.524 Non-pressure chronic ulcer of other part of left foot with necros L97.528 Non-pressure chronic ulcer of other part of left foot with other Modifier: is of bone specified severi Quantity: 1 ty Electronic Signature(s) Signed: 01/15/2019 4:45:14 PM By: Linton Ham MD Entered By: Linton Ham on 01/15/2019 14:59:29

## 2019-01-15 NOTE — Progress Notes (Signed)
DYLIN, HALLAK ([id number]) Visit Report for 01/15/2019 Abuse/Suicide Risk Screen Details Patient Name: Dylan Robles, Dylan Robles. Date of Service: 01/15/2019 1:15 PM Medical Record Number: [id number] Patient Account Number: [id number] Date of Birth/Sex: 09-21-1940 (79 y.o. M) Treating RN: Harold Barban Primary Care Jadriel Saxer: Lucia Gaskins Other Clinician: Referring Jaslen Adcox: [name] Treating Florita Nitsch/Extender: Tito Dine in Treatment: 0 Abuse/Suicide Risk Screen Items Answer ABUSE RISK SCREEN: Has anyone close to you tried to hurt or harm you recentlyo No Do you feel uncomfortable with anyone in your familyo No Has anyone forced you do things that you didnot want to doo No Electronic Signature(s) Signed: 01/15/2019 3:59:27 PM By: Harold Barban Entered By: Harold Barban on 01/15/2019 13:44:17 Dylan Robles ([id number]) -------------------------------------------------------------------------------- Activities of Daily Living Details Patient Name: Dylan Robles. Date of Service: 01/15/2019 1:15 PM Medical Record Number: [id number] Patient Account Number: [id number] Date of Birth/Sex: 1940-12-13 (79 y.o. M) Treating RN: Harold Barban Primary Care Sharlet Notaro: Lucia Gaskins Other Clinician: Referring Murry Diaz: [name] Treating Alphonza Tramell/Extender: Tito Dine in Treatment: 0 Activities of Daily Living Items Answer Activities of Daily Living (Please select one for each item) Drive Automobile Completely Able Take Medications Completely Able Use Telephone Completely Able Care for Appearance Completely Able Use Toilet Completely Able Bath / Shower Completely Able Dress Self Completely Able Feed Self Completely Able Walk Completely Able Get In / Out Bed Completely Able Housework Completely Able Prepare Meals Completely Able Handle Money Completely Able Shop for Self Completely Able Electronic Signature(s) Signed: 01/15/2019 3:59:27 PM By: Harold Barban Entered By: Harold Barban on 01/15/2019 13:44:30 Dylan Robles ([id number]) -------------------------------------------------------------------------------- Education Screening Details Patient Name: Dylan Robles. Date of Service: 01/15/2019 1:15 PM Medical Record Number: [id number] Patient Account Number: [id number] Date of Birth/Sex: 12-Jul-1940 (79 y.o. M) Treating RN: Harold Barban Primary Care Maisy Newport: Lucia Gaskins Other Clinician: Referring Aydin Hink: [name] Treating Oryan Winterton/Extender: Tito Dine in Treatment: 0 Primary Learner Assessed: Patient Learning Preferences/Education Level/Primary Language Learning Preference: Explanation Highest Education Level: High School Preferred Language: English Cognitive Barrier Language Barrier: No Translator Needed: No Memory Deficit: No Emotional Barrier: No Cultural/Religious Beliefs Affecting Medical Care: No Physical Barrier Impaired Vision: No Impaired Hearing: No Decreased Hand dexterity: No Knowledge/Comprehension Knowledge Level: High Comprehension Level: High Ability to understand written High instructions: Ability to understand verbal High instructions: Motivation Anxiety Level: Calm Cooperation: Cooperative Education Importance: Acknowledges Need Interest in Health Problems: Asks Questions Perception: Coherent Willingness to Engage in Self- High Management Activities: Readiness to Engage in Self- High Management Activities: Electronic Signature(s) Signed: 01/15/2019 3:59:27 PM By: Harold Barban Entered By: Harold Barban on 01/15/2019 13:44:52 Dylan Robles ([id number]) -------------------------------------------------------------------------------- Fall Risk Assessment Details Patient Name: Dylan Robles. Date of Service: 01/15/2019 1:15 PM Medical Record Number: [id number] Patient Account Number: [id number] Date of Birth/Sex: 05-01-1940 (79 y.o. M) Treating RN: Harold Barban Primary Care Latanza Pfefferkorn: Lucia Gaskins Other Clinician: Referring Hakan Nudelman: [name] Treating Islam Villescas/Extender: Tito Dine in Treatment: 0 Fall Risk Assessment Items Have you had 2 or more falls in the last 12 monthso 0 No Have you had any fall that resulted in injury in the last 12 monthso 0 No FALLS RISK SCREEN History of falling - immediate or within 3 months 0 No Secondary diagnosis (Do you have 2 or more medical diagnoseso) 0 No Ambulatory aid None/bed rest/wheelchair/nurse 0 No Crutches/cane/walker 15 Yes Furniture 0 No Intravenous therapy Access/Saline/Heparin Lock 0 No Gait/Transferring Normal/ bed rest/ wheelchair 0 No Weak (short  steps with or without shuffle, stooped but able to lift head while 0 No walking, may seek support from furniture) Impaired (short steps with shuffle, may have difficulty arising from chair, head 0 No down, impaired balance) Mental Status Oriented to own ability 0 Yes Electronic Signature(s) Signed: 01/15/2019 3:59:27 PM By: Harold Barban Entered By: Harold Barban on 01/15/2019 13:45:17 Dylan Robles ([id number]) -------------------------------------------------------------------------------- Foot Assessment Details Patient Name: Dylan Robles. Date of Service: 01/15/2019 1:15 PM Medical Record Number: [id number] Patient Account Number: [id number] Date of Birth/Sex: 10/11/1940 (79 y.o. M) Treating RN: Harold Barban Primary Care Loveah Like: Lucia Gaskins Other Clinician: Referring Melora Menon: [name] Treating Romir Klimowicz/Extender: Tito Dine in Treatment: 0 Foot Assessment Items Site Locations + = Sensation present, - = Sensation absent, C = Callus, U = Ulcer R = Redness, W = Warmth, M = Maceration, PU = Pre-ulcerative lesion F = Fissure, S = Swelling, D = Dryness Assessment Right: Left: Other Deformity: No No Prior Foot Ulcer: No No Prior Amputation: No No Charcot Joint: No  No Ambulatory Status: Ambulatory With Help Assistance Device: Cane Gait: Steady Electronic Signature(s) Signed: 01/15/2019 3:59:27 PM By: Harold Barban Entered By: Harold Barban on 01/15/2019 13:47:50 Dylan Robles ([id number]) -------------------------------------------------------------------------------- Nutrition Risk Screening Details Patient Name: SAAFIR, WIESEMANN. Date of Service: 01/15/2019 1:15 PM Medical Record Number: [id number] Patient Account Number: [id number] Date of Birth/Sex: Nov 21, 1940 (79 y.o. M) Treating RN: Harold Barban Primary Care Kasai Beltran: Lucia Gaskins Other Clinician: Referring Nechama Escutia: [name] Treating Aranda Bihm/Extender: Tito Dine in Treatment: 0 Height (in): 78 Weight (lbs): 206 Body Mass Index (BMI): 23.8 Nutrition Risk Screening Items Score Screening NUTRITION RISK SCREEN: I have an illness or condition that made me change the kind and/or amount of 0 No food I eat I eat fewer than two meals per day 0 No I eat few fruits and vegetables, or milk products 0 No I have three or more drinks of beer, liquor or wine almost every day 0 No I have tooth or mouth problems that make it hard for me to eat 0 No I don't always have enough money to buy the food I need 0 No I eat alone most of the time 0 No I take three or more different prescribed or over-the-counter drugs a day 1 Yes Without wanting to, I have lost or gained 10 pounds in the last six months 0 No I am not always physically able to shop, cook and/or feed myself 0 No Nutrition Protocols Good Risk Protocol Moderate Risk Protocol High Risk Proctocol Risk Level: Good Risk Score: 1 Electronic Signature(s) Signed: 01/15/2019 3:59:27 PM By: Harold Barban Entered By: Harold Barban on 01/15/2019 13:45:26

## 2019-01-15 NOTE — Progress Notes (Signed)
[name], [name] ([id number]) Visit Report for 01/15/2019 Allergy List Details Patient Name: Robles, Dylan. Date of Service: 01/15/2019 1:15 PM Medical Record Number: [id number] Patient Account Number: [id number] Date of Birth/Sex: January 03, 1941 (79 y.o. M) Treating RN: Harold Barban Primary Care Marylen Zuk: Lucia Gaskins Other Clinician: Referring Bhavya Grand: [name] Treating Arihana Ambrocio/Extender: Ricard Dillon Weeks in Treatment: 0 Allergies Active Allergies Plavix Reaction: Hives Severity: Moderate Allergy Notes Electronic Signature(s) Signed: 01/15/2019 3:59:27 PM By: Harold Barban Entered By: Harold Barban on 01/15/2019 13:29:22 [name] ([id number]) -------------------------------------------------------------------------------- Arrival Information Details Patient Name: Robles, Dylan. Date of Service: 01/15/2019 1:15 PM Medical Record Number: [id number] Patient Account Number: [id number] Date of Birth/Sex: [birth year] (79 y.o. M) Treating RN: Cornell Barman Primary Care Lavaya Defreitas: Lucia Gaskins Other Clinician: Referring Marlisa Caridi: [name] Treating Mayra Jolliffe/Extender: Tito Dine in Treatment: 0 Visit Information Patient Arrived: Wheel Chair Arrival Time: 13:24 Accompanied By: wife Transfer Assistance: None Patient Identification Verified: Yes Secondary Verification Process Yes Completed: Patient Has Alerts: Yes Patient Alerts: ABI L.54 R.72 08/2018 Electronic Signature(s) Signed: 01/15/2019 2:18:41 PM By: Harold Barban Entered By: Harold Barban on 01/15/2019 14:18:40 [name] ([id number]) -------------------------------------------------------------------------------- Clinic Level of Care Assessment Details Patient Name: [name], [name]. Date of Service: 01/15/2019 1:15 PM Medical Record Number: [id number] Patient Account Number: [id number] Date of Birth/Sex: [birth year] (79 y.o. M) Treating RN: Cornell Barman Primary Care Asna Muldrow:  Lucia Gaskins Other Clinician: Referring Javionna Leder: [name] Treating Yarlin Breisch/Extender: Tito Dine in Treatment: 0 Clinic Level of Care Assessment Items TOOL 1 Quantity Score []  - Use when EandM and Procedure is performed on INITIAL visit 0 ASSESSMENTS - Nursing Assessment / Reassessment X - General Physical Exam (combine w/ comprehensive assessment (listed just below) when 1 20 performed on new pt. evals) X- 1 25 Comprehensive Assessment (HX, ROS, Risk Assessments, Wounds Hx, etc.) ASSESSMENTS - Wound and Skin Assessment / Reassessment []  - Dermatologic / Skin Assessment (not related to wound area) 0 ASSESSMENTS - Ostomy and/or Continence Assessment and Care []  - Incontinence Assessment and Management 0 []  - 0 Ostomy Care Assessment and Management (repouching, etc.) PROCESS - Coordination of Care X - Simple Patient / Family Education for ongoing care 1 15 []  - 0 Complex (extensive) Patient / Family Education for ongoing care X- 1 10 Staff obtains Programmer, systems, Records, Test Results / Process Orders []  - 0 Staff telephones HHA, Nursing Homes / Clarify orders / etc []  - 0 Routine Transfer to another Facility (non-emergent condition) []  - 0 Routine Hospital Admission (non-emergent condition) X- 1 15 New Admissions / Biomedical engineer / Ordering NPWT, Apligraf, etc. []  - 0 Emergency Hospital Admission (emergent condition) PROCESS - Special Needs []  - Pediatric / Minor Patient Management 0 []  - 0 Isolation Patient Management []  - 0 Hearing / Language / Visual special needs []  - 0 Assessment of Community assistance (transportation, D/C planning, etc.) []  - 0 Additional assistance / Altered mentation []  - 0 Support Surface(s) Assessment (bed, cushion, seat, etc.) APOLLO, MACHER ([id number]) INTERVENTIONS - Miscellaneous []  - External ear exam 0 []  - 0 Patient Transfer (multiple staff / Civil Service fast streamer / Similar devices) []  - 0 Simple Staple / Suture  removal (25 or less) []  - 0 Complex Staple / Suture removal (26 or more) []  - 0 Hypo/Hyperglycemic Management (do not check if billed separately) X- 1 15 Ankle / Brachial Index (ABI) - do not check if billed separately Has the patient been seen at the hospital within the last three  years: Yes Total Score: 100 Level Of Care: New/Established - Level 3 Electronic Signature(s) Signed: 01/15/2019 4:51:02 PM By: Gretta Cool, BSN, RN, CWS, Kim RN, BSN Entered By: Gretta Cool, BSN, RN, CWS, Kim on 01/15/2019 14:32:09 [name] ([id number]) -------------------------------------------------------------------------------- Encounter Discharge Information Details Patient Name: Robles, Dylan. Date of Service: 01/15/2019 1:15 PM Medical Record Number: [id number] Patient Account Number: [id number] Date of Birth/Sex: November 09, 1940 (79 y.o. M) Treating RN: Cornell Barman Primary Care Kerrion Kemppainen: Lucia Gaskins Other Clinician: Referring Danny Yackley: [name] Treating Marget Outten/Extender: Tito Dine in Treatment: 0 Encounter Discharge Information Items Post Procedure Vitals Discharge Condition: Stable Temperature (F): 98.4 Ambulatory Status: Ambulatory Pulse (bpm): 68 Discharge Destination: Home Respiratory Rate (breaths/min): 16 Transportation: Private Auto Blood Pressure (mmHg): 119/55 Accompanied By: wife Schedule Follow-up Appointment: Yes Clinical Summary of Care: Electronic Signature(s) Signed: 01/15/2019 4:51:02 PM By: Gretta Cool, BSN, RN, CWS, Kim RN, BSN Entered By: Gretta Cool, BSN, RN, CWS, Kim on 01/15/2019 14:34:20 [name] ([id number]) -------------------------------------------------------------------------------- Lower Extremity Assessment Details Patient Name: Robles, Dylan. Date of Service: 01/15/2019 1:15 PM Medical Record Number: [id number] Patient Account Number: [id number] Date of Birth/Sex: 03-Jun-1940 (79 y.o. M) Treating RN: Harold Barban Primary Care Jayren Cease: Lucia Gaskins Other Clinician: Referring Miasha Emmons: [name] Treating Cipriana Biller/Extender: Tito Dine in Treatment: 0 Edema Assessment Assessed: [Left: No] [Right: No] [Left: Edema] [Right: :] Calf Left: Right: Point of Measurement: 40 cm From Medial Instep 34.5 cm 35.5 cm Ankle Left: Right: Point of Measurement: 12 cm From Medial Instep 24 cm 25 cm Vascular Assessment Pulses: Dorsalis Pedis Palpable: [Left:Yes] [Right:Yes] Posterior Tibial Palpable: [Left:No Yes] [Right:No Inaudible] Electronic Signature(s) Signed: 01/15/2019 3:59:27 PM By: Harold Barban Entered By: Harold Barban on 01/15/2019 13:55:41 [name] ([id number]) -------------------------------------------------------------------------------- Multi Wound Chart Details Patient Name: DARRIAN, [place]. Date of Service: 01/15/2019 1:15 PM Medical Record Number: [id number] Patient Account Number: [id number] Date of Birth/Sex: [birth year] (79 y.o. M) Treating RN: Cornell Barman Primary Care [place] Flett: Lucia Gaskins Other Clinician: Referring Lorella Gomez: [name] Treating Alailah Safley/Extender: Tito Dine in Treatment: 0 Vital Signs Height(in): 78 Pulse(bpm): 65 Weight(lbs): 206 Blood Pressure(mmHg): 119/55 Body Mass Index(BMI): 24 Temperature(F): 98.4 Respiratory Rate 16 (breaths/min): Photos: [N/A:N/A] Wound Location: Left Toe Great - Dorsal Left Foot - Lateral N/A Wounding Event: Gradually Appeared Gradually Appeared N/A Primary Etiology: Diabetic Wound/Ulcer of the Diabetic Wound/Ulcer of the N/A Lower Extremity Lower Extremity Comorbid History: Congestive Heart Failure, Congestive Heart Failure, N/A Hypertension, Type II Diabetes Hypertension, Type II Diabetes Date Acquired: 07/10/2018 08/10/2018 N/A Weeks of Treatment: 0 0 N/A Wound Status: Open Open N/A Measurements L x W x D 0.7x1.2x0.1 0.9x0.9x0.2 N/A (cm) Area (cm) : 0.66 0.636 N/A Volume (cm) : 0.066 0.127  N/A Classification: Grade 2 Grade 2 N/A Exudate Amount: Medium Medium N/A Exudate Type: Serous Serous N/A Exudate Color: amber amber N/A Wound Margin: Flat and Intact Flat and Intact N/A Granulation Amount: None Present (0%) Small (1-33%) N/A Granulation Quality: N/A Pale N/A Necrotic Amount: Medium (34-66%) Medium (34-66%) N/A Exposed Structures: Fat Layer (Subcutaneous Fat Layer (Subcutaneous N/A Tissue) Exposed: Yes Tissue) Exposed: Yes Fascia: No Fascia: No Tendon: No Tendon: No Muscle: No Muscle: No Joint: No Joint: No Bone: No Bone: No Epithelialization: None None N/A LEVERNE, CORIELL ([id number]) Treatment Notes Electronic Signature(s) Signed: 01/15/2019 4:51:02 PM By: Gretta Cool, BSN, RN, CWS, Kim RN, BSN Entered By: Gretta Cool, BSN, RN, CWS, Kim on 01/15/2019 14:24:17 [name] ([id number]) -------------------------------------------------------------------------------- Multi-Disciplinary Care Plan Details Patient Name: YADEL, MILLERICK.  Date of Service: 01/15/2019 1:15 PM Medical Record Number: [id number] Patient Account Number: [id number] Date of Birth/Sex: Aug 23, 1940 (79 y.o. M) Treating RN: Cornell Barman Primary Care Nickoli Bagheri: Lucia Gaskins Other Clinician: Referring Loula Marcella: [name] Treating Tyerra Loretto/Extender: Tito Dine in Treatment: 0 Active Inactive Pain, Acute or Chronic Nursing Diagnoses: Potential alteration in comfort, pain Goals: Patient will verbalize adequate pain control and receive pain control interventions during procedures as needed Date Initiated: 01/15/2019 Target Resolution Date: 02/12/2019 Goal Status: Active Patient/caregiver will verbalize adequate pain control between visits Date Initiated: 01/15/2019 Target Resolution Date: 02/12/2019 Goal Status: Active Interventions: Provide education on pain management Reposition patient for comfort Treatment Activities: Administer pain control measures as ordered :  01/15/2019 Notes: Peripheral Neuropathy Nursing Diagnoses: Knowledge deficit related to disease process and management of peripheral neurovascular dysfunction Goals: Patient/caregiver will verbalize understanding of disease process and disease management Date Initiated: 01/15/2019 Target Resolution Date: 02/12/2019 Goal Status: Active Interventions: Assess signs and symptoms of neuropathy upon admission and as needed Provide education on Management of Neuropathy and Related Ulcers Notes: Soft Tissue Infection Nursing Diagnoses: Impaired tissue integrity NURIEL, DIMUZIO ([id number]) Goals: Patient will remain free of wound infection Date Initiated: 01/15/2019 Target Resolution Date: 01/15/2019 Goal Status: Active Interventions: Assess signs and symptoms of infection every visit Notes: Tissue Oxygenation Nursing Diagnoses: Actual ineffective tissue perfusion; peripheral (select once diagnosis is confirmed) Goals: Invasive arterial studies completed as ordered Date Initiated: 01/15/2019 Target Resolution Date: 02/05/2019 Goal Status: Active Non-invasive arterial studies are completed as ordered Date Initiated: 01/15/2019 Target Resolution Date: 02/05/2019 Goal Status: Active Interventions: Assess patient understanding of disease process and management upon diagnosis and as needed Assess peripheral arterial status upon admission and as needed Notes: Wound/Skin Impairment Nursing Diagnoses: Impaired tissue integrity Goals: Patient/caregiver will verbalize understanding of skin care regimen Date Initiated: 01/15/2019 Target Resolution Date: 02/05/2019 Goal Status: Active Ulcer/skin breakdown will have a volume reduction of 30% by week 4 Date Initiated: 01/15/2019 Target Resolution Date: 02/12/2019 Goal Status: Active Interventions: Assess ulceration(s) every visit Treatment Activities: Referred to DME Grayson White for dressing supplies : 01/15/2019 Notes: Electronic Signature(s) Signed:  01/15/2019 4:51:02 PM By: Gretta Cool, BSN, RN, CWS, Kim RN, BSN [place], [place] ([id number]) Entered By: Gretta Cool, BSN, RN, CWS, Kim on 01/15/2019 14:23:54 [name] ([id number]) -------------------------------------------------------------------------------- Pain Assessment Details Patient Name: ALEXAN, MIGNOGNA. Date of Service: 01/15/2019 1:15 PM Medical Record Number: [id number] Patient Account Number: [id number] Date of Birth/Sex: May 08, 1940 (79 y.o. M) Treating RN: Cornell Barman Primary Care Isaid Salvia: Lucia Gaskins Other Clinician: Referring Lallie Strahm: [name] Treating Rhesa Forsberg/Extender: Tito Dine in Treatment: 0 Active Problems Location of Pain Severity and Description of Pain Patient Has Paino Yes Site Locations Rate the pain. Current Pain Level: 10 Pain Management and Medication Current Pain Management: Electronic Signature(s) Signed: 01/15/2019 3:30:55 PM By: Lorine Bears RCP, RRT, CHT Signed: 01/15/2019 4:51:02 PM By: Gretta Cool, BSN, RN, CWS, Kim RN, BSN Entered By: Lorine Bears on 01/15/2019 13:24:52 [name] ([id number]) -------------------------------------------------------------------------------- Patient/Caregiver Education Details Patient Name: JAYVONE, DIANE Date of Service: 01/15/2019 1:15 PM Medical Record Number: [id number] Patient Account Number: [id number] Date of Birth/Gender: 06-14-1940 (79 y.o. M) Treating RN: Cornell Barman Primary Care Physician: Lucia Gaskins Other Clinician: Referring Physician: [name] Treating Physician/Extender: Tito Dine in Treatment: 0 Education Assessment Education Provided To: Patient Education Topics Provided Peripheral Neuropathy: Handouts: Diabetes Methods: Demonstration, Explain/Verbal Responses: State content correctly Wound Debridement: Handouts: Wound Debridement Methods: Demonstration, Explain/Verbal Responses: Return demonstration  correctly Wound/Skin Impairment: Handouts: Caring for Your Ulcer Methods: Demonstration, Explain/Verbal Responses: State content correctly Electronic Signature(s) Signed: 01/15/2019 4:51:02 PM By: Gretta Cool, BSN, RN, CWS, Kim RN, BSN Entered By: Gretta Cool, BSN, RN, CWS, Kim on 01/15/2019 14:32:46 [name] ([id number]) -------------------------------------------------------------------------------- Wound Assessment Details Patient Name: LAMARION, SPEDDING. Date of Service: 01/15/2019 1:15 PM Medical Record Number: [id number] Patient Account Number: [id number] Date of Birth/Sex: April 18, 1940 (79 y.o. M) Treating RN: Harold Barban Primary Care Angelo Prindle: Lucia Gaskins Other Clinician: Referring Captola Teschner: [name] Treating Dericka Ostenson/Extender: Tito Dine in Treatment: 0 Wound Status Wound Number: 1 Primary Diabetic Wound/Ulcer of the Lower Extremity Etiology: Wound Location: Left Toe Great - Dorsal Wound Status: Open Wounding Event: Gradually Appeared Comorbid Congestive Heart Failure, Hypertension, Date Acquired: 07/10/2018 History: Type II Diabetes Weeks Of Treatment: 0 Clustered Wound: No Photos Wound Measurements Length: (cm) 0.7 % Reduction i Width: (cm) 1.2 % Reduction i Depth: (cm) 0.1 Epithelializa Area: (cm) 0.66 Tunneling: Volume: (cm) 0.066 Undermining: n Area: n Volume: tion: None No No Wound Description Classification: Grade 2 Foul Odor Aft Wound Margin: Flat and Intact Slough/Fibrin Exudate Amount: Medium Exudate Type: Serous Exudate Color: amber er Cleansing: No o Yes Wound Bed Granulation Amount: None Present (0%) Exposed Structure Necrotic Amount: Medium (34-66%) Fascia Exposed: No Necrotic Quality: Adherent Slough Fat Layer (Subcutaneous Tissue) Exposed: Yes Tendon Exposed: No Muscle Exposed: No Joint Exposed: No Bone Exposed: No Treatment Notes ZIA, GUARISCO ([id number]) Wound #1 (Left, Dorsal Toe Great) Notes Santyl, gauze and  conform Electronic Signature(s) Signed: 01/15/2019 3:59:27 PM By: Harold Barban Entered By: Harold Barban on 01/15/2019 13:59:38 [name] ([id number]) -------------------------------------------------------------------------------- Wound Assessment Details Patient Name: BROGHAN, YOUNKINS. Date of Service: 01/15/2019 1:15 PM Medical Record Number: [id number] Patient Account Number: [id number] Date of Birth/Sex: [birth year] (79 y.o. M) Treating RN: Harold Barban Primary Care Yemaya Barnier: Lucia Gaskins Other Clinician: Referring Shaniqwa Horsman: [name] Treating Depaul Arizpe/Extender: Tito Dine in Treatment: 0 Wound Status Wound Number: 2 Primary Diabetic Wound/Ulcer of the Lower Extremity Etiology: Wound Location: Left Foot - Lateral Wound Status: Open Wounding Event: Gradually Appeared Comorbid Congestive Heart Failure, Hypertension, Date Acquired: 08/10/2018 History: Type II Diabetes Weeks Of Treatment: 0 Clustered Wound: No Photos Wound Measurements Length: (cm) 0.9 % Reduction i Width: (cm) 0.9 % Reduction i Depth: (cm) 0.2 Epithelializa Area: (cm) 0.636 Tunneling: Volume: (cm) 0.127 Undermining: n Area: n Volume: tion: None No No Wound Description Classification: Grade 2 Foul Odor Aft Wound Margin: Flat and Intact Slough/Fibrin Exudate Amount: Medium Exudate Type: Serous Exudate Color: amber er Cleansing: No o Yes Wound Bed Granulation Amount: Small (1-33%) Exposed Structure Granulation Quality: Pale Fascia Exposed: No Necrotic Amount: Medium (34-66%) Fat Layer (Subcutaneous Tissue) Exposed: Yes Necrotic Quality: Adherent Slough Tendon Exposed: No Muscle Exposed: No Joint Exposed: No Bone Exposed: No Treatment Notes TALYN, DUFFNER ([id number]) Wound #2 (Left, Lateral Foot) Notes Santyl, gauze and conform Electronic Signature(s) Signed: 01/15/2019 3:59:27 PM By: Harold Barban Entered By: Harold Barban on 01/15/2019 14:01:59 [name] ([id number]) -------------------------------------------------------------------------------- Vitals Details Patient Name: [name]. Date of Service: 01/15/2019 1:15 PM Medical Record Number: [id number] Patient Account Number: [id number] Date of Birth/Sex: Jun 18, 1940 (79 y.o. M) Treating RN: Cornell Barman Primary Care Navaeh Kehres: Lucia Gaskins Other Clinician: Referring Jakota Manthei: [name] Treating Lonzell Dorris/Extender: Tito Dine in Treatment: 0 Vital Signs Time Taken: 13:25 Temperature (F): 98.4 Height (in): 78 Pulse (bpm): 68 Source: Stated Respiratory Rate (breaths/min): 16 Weight (lbs): 206 Blood Pressure (mmHg):  119/55 Source: Measured Reference Range: 80 - 120 mg / dl Body Mass Index (BMI): 23.8 Electronic Signature(s) Signed: 01/15/2019 3:30:55 PM By: Lorine Bears RCP, RRT, CHT Entered By: Lorine Bears on 01/15/2019 13:27:31

## 2019-01-17 ENCOUNTER — Ambulatory Visit (INDEPENDENT_AMBULATORY_CARE_PROVIDER_SITE_OTHER): Payer: Medicare HMO

## 2019-01-17 ENCOUNTER — Other Ambulatory Visit: Payer: Self-pay

## 2019-01-17 ENCOUNTER — Ambulatory Visit (INDEPENDENT_AMBULATORY_CARE_PROVIDER_SITE_OTHER): Payer: Medicare HMO | Admitting: Nurse Practitioner

## 2019-01-17 ENCOUNTER — Encounter (INDEPENDENT_AMBULATORY_CARE_PROVIDER_SITE_OTHER): Payer: Self-pay | Admitting: Nurse Practitioner

## 2019-01-17 VITALS — BP 142/69 | HR 88 | Resp 14 | Ht 78.0 in | Wt 203.0 lb

## 2019-01-17 DIAGNOSIS — L97521 Non-pressure chronic ulcer of other part of left foot limited to breakdown of skin: Secondary | ICD-10-CM

## 2019-01-17 DIAGNOSIS — Z95820 Peripheral vascular angioplasty status with implants and grafts: Secondary | ICD-10-CM

## 2019-01-17 DIAGNOSIS — E1169 Type 2 diabetes mellitus with other specified complication: Secondary | ICD-10-CM | POA: Diagnosis not present

## 2019-01-17 DIAGNOSIS — I7025 Atherosclerosis of native arteries of other extremities with ulceration: Secondary | ICD-10-CM | POA: Diagnosis not present

## 2019-01-17 DIAGNOSIS — I70245 Atherosclerosis of native arteries of left leg with ulceration of other part of foot: Secondary | ICD-10-CM

## 2019-01-17 DIAGNOSIS — E785 Hyperlipidemia, unspecified: Secondary | ICD-10-CM

## 2019-01-20 ENCOUNTER — Encounter (INDEPENDENT_AMBULATORY_CARE_PROVIDER_SITE_OTHER): Payer: Self-pay | Admitting: Nurse Practitioner

## 2019-01-20 NOTE — Progress Notes (Signed)
SUBJECTIVE:  Patient ID: Dylan Robles, male    DOB: 1940-07-14, 79 y.o.   MRN: [id number] Chief Complaint  Patient presents with  . Follow-up    80mk U/S follow up    HPI  Dylan Robles is a 79 y.o. male the presents today for follow-up noninvasive studies after angiogram on 12/24/2018 due to lower extremity ulceration.  The patient underwent angioplasty of his posterior tibial artery and anterior tibial artery..  The patient most recently began seeing wound care as well.  There is some report that the patient's wound is healing however slowly.  He does endorse that the pain he was having in his lower extremities is greatly decreased.  There have been no fever, chills, nausea vomiting or diarrhea following his procedure.  Today his right ABI 0.67 and his left is 0.79.  Previous studies on 08/26/2018 show a right ABI 0.67 and a left of 0.54.  The patient continues to have monophasic waveforms in the tibial arteries of the right lower extremity.  A left lower extremity arterial duplex reveals biphasic/triphasic waveforms down to the level of the tibial arteries where it transitions to monophasic flow throughout the calf vessels.  Past Medical History:  Diagnosis Date  . Coronary artery disease    hyperlipidemia  . Diabetes mellitus   . Diabetes mellitus without complication ([place])   . Hypercholesterolemia   . Hypertension   . Hypothyroidism   . Shortness of breath dyspnea    with exertion  . Vasovagal syncope     Past Surgical History:  Procedure Laterality Date  . APPENDECTOMY    . CARDIAC CATHETERIZATION N/A 06/16/2014   Procedure: Left Heart Cath;  Surgeon: Dionisio David, MD;  Location: [facility];  Service: Cardiovascular;  Laterality: N/A;  . CIRCUMCISION, NON-NEWBORN    . HERNIA REPAIR    . LOWER EXTREMITY ANGIOGRAPHY Left 12/24/2018   Procedure: LOWER EXTREMITY ANGIOGRAPHY;  Surgeon: Katha Cabal, MD;  Location: [facility];  Service: Cardiovascular;   Laterality: Left;  . THYROID SURGERY      Social History   Socioeconomic History  . Marital status: Married    Spouse name: Not on file  . Number of children: Not on file  . Years of education: Not on file  . Highest education level: Not on file  Occupational History  . 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 Smoking status: Former Research scientist (life sciences)  . Smokeless tobacco: Never Used  Substance and Sexual Activity  . Alcohol use: No  . Drug use: No  . Sexual activity: Not on file  Other Topics Concern  . Not on file  Social History Narrative   ** Merged History Encounter **       Social Determinants of Health   Financial Resource Strain:   . Difficulty of Paying Living Expenses: Not on file  Food Insecurity:   . Worried About Charity fundraiser in the Last Year: Not on file  . Ran Out of Food in the Last Year: Not on file  Transportation Needs:   . Lack of Transportation (Medical): Not on file  . Lack of Transportation (Non-Medical): Not on file  Physical Activity:   . Days of Exercise per Week: Not on file  . Minutes of Exercise per Session: Not on file  Stress:   . Feeling of Stress : Not on file  Social Connections:   . Frequency of Communication with Friends  and Family: Not on file  . Frequency of Social Gatherings with Friends and Family: Not on file  . Attends Religious Services: Not on file  . Active Member of Clubs or Organizations: Not on file  . Attends Archivist Meetings: Not on file  . Marital Status: Not on file  Intimate Partner Violence:   . Fear of Current or Ex-Partner: Not on file  . Emotionally Abused: Not on file  . Physically Abused: Not on file  . Sexually Abused: Not on file    Family History  Problem Relation Age of Onset  . Diabetes Father   . Hypertension Father   . Cancer Mother   . Diabetes Brother     Allergies  Allergen Reactions  . Plavix [Clopidogrel] Hives     Review of Systems   Review of  Systems: Negative Unless Checked Constitutional: [] Weight loss  [] Fever  [] Chills Cardiac: [] Chest pain   []  Atrial Fibrillation  [] Palpitations   [] Shortness of breath when laying flat   [] Shortness of breath with exertion. [] Shortness of breath at rest Vascular:  [] Pain in legs with walking   [] Pain in legs with standing [] Pain in legs when laying flat   [] Claudication    [] Pain in feet when laying flat    [] History of DVT   [] Phlebitis   [x] Swelling in legs   [] Varicose veins   [x] Non-healing ulcers Pulmonary:   [] Uses home oxygen   [] Productive cough   [] Hemoptysis   [] Wheeze  [] COPD   [] Asthma Neurologic:  [] Dizziness   [] Seizures  [] Blackouts [] History of stroke   [] History of TIA  [] Aphasia   [] Temporary Blindness   [] Weakness or numbness in arm   [x] Weakness or numbness in leg Musculoskeletal:   [] Joint swelling   [] Joint pain   [] Low back pain  []  History of Knee Replacement [] Arthritis [] back Surgeries  []  Spinal Stenosis    Hematologic:  [] Easy bruising  [] Easy bleeding   [] Hypercoagulable state   [] Anemic Gastrointestinal:  [] Diarrhea   [] Vomiting  [] Gastroesophageal reflux/heartburn   [] Difficulty swallowing. [] Abdominal pain Genitourinary:  [] Chronic kidney disease   [] Difficult urination  [] Anuric   [] Blood in urine [] Frequent urination  [] Burning with urination   [] Hematuria Skin:  [] Rashes   [] Ulcers [] Wounds Psychological:  [] History of anxiety   []  History of major depression  []  Memory Difficulties      OBJECTIVE:   Physical Exam  BP (!) 142/69 (BP Location: Right Arm)   Pulse 88   Resp 14   Ht 6\' 6"  (1.981 m)   Wt 203 lb (92.1 kg)   BMI 23.46 kg/m   Gen: WD/WN, NAD Head: [place]/AT, No temporalis wasting.  Ear/Nose/Throat: Hearing grossly intact, nares w/o erythema or drainage Eyes: PER, EOMI, sclera nonicteric.  Neck: Supple, no masses.  No JVD.  Pulmonary:  Good air movement, no use of accessory muscles.  Cardiac: RRR Vascular:  Wounds to left toe, 2+ edema right  ankle Vessel Right Left  Radial Palpable Palpable  Dorsalis Pedis Not Palpable Not Palpable  Posterior Tibial Not Palpable Not Palpable   Gastrointestinal: soft, non-distended. No guarding/no peritoneal signs.  Musculoskeletal: M/S 5/5 throughout.  No deformity or atrophy.  Neurologic: Pain and light touch intact in extremities.  Symmetrical.  Speech is fluent. Motor exam as listed above. Psychiatric: Judgment intact, Mood & affect appropriate for pt's clinical situation.        ASSESSMENT AND PLAN:  1. Ulcer of left foot, limited to breakdown of skin ([place])  The patient has shown some evidence of wound healing however slowly.  Therefore instead of proceeding with angiogram we will maintain close follow-up due to the wound.  We will have the patient return in 6 weeks with noninvasive studies to assess patient's blood flow as well as to assess wound progression.  If the patient's wound is continuing to heal we will continue to follow its progression.  However, if the wound is worsening or not healing at all we may consider another angiogram.  Also if the patient's podiatrist or wound care center feels that we should proceed sooner, we will have the patient return prior to 6 weeks.  2. Type 2 diabetes mellitus with hyperlipidemia ([place]) Good diabetic control is essential for wound healing.  The patient on appropriate medications.  No changes made today.  3. Atherosclerosis of native arteries of the extremities with ulceration ([place]) The patient does show evidence of improved perfusion following angioplasty however patient continues to have monophasic waveforms.  As outlined above we will maintain close follow-up to ensure that perfusion maintains adequate for wound healing.   Current Outpatient Medications on File Prior to Visit  Medication Sig Dispense Refill  . amLODipine (NORVASC) 5 MG tablet Take 5 mg by mouth daily.     [name] aspirin EC 81 MG tablet Take 81 mg by mouth daily.     [name]  atorvastatin (LIPITOR) 80 MG tablet Take 80 mg by mouth at bedtime.     [name] BRILINTA 60 MG TABS tablet Take 60 mg by mouth 2 (two) times daily.     [name] doxycycline (VIBRA-TABS) 100 MG tablet Take 1 tablet (100 mg total) by mouth 2 (two) times daily. 20 tablet 0  . gabapentin (NEURONTIN) 300 MG capsule Take 300 mg by mouth 2 (two) times daily.     . insulin aspart (NOVOLOG) 100 UNIT/ML injection Inject 5 Units into the skin 3 (three) times daily before meals.     . isosorbide mononitrate (IMDUR) 30 MG 24 hr tablet Take 30 mg by mouth daily.    [name] LEVEMIR FLEXTOUCH 100 UNIT/ML Pen Inject 34 Units into the skin at bedtime.     [name] levothyroxine (SYNTHROID, LEVOTHROID) 88 MCG tablet Take 1 tablet (88 mcg total) by mouth daily. 90 tablet 1  . lisinopril (ZESTRIL) 10 MG tablet Take 10 mg by mouth daily.     . metFORMIN (GLUCOPHAGE) 1000 MG tablet Take 1 tablet (1,000 mg total) by mouth 2 (two) times daily. 180 tablet 1  . metoprolol succinate (TOPROL-XL) 50 MG 24 hr tablet Take 50 mg by mouth daily.     . tamsulosin (FLOMAX) 0.4 MG CAPS capsule Take 0.4 mg by mouth at bedtime.     No current facility-administered medications on file prior to visit.    There are no Patient Instructions on file for this visit. No follow-ups on file.   Kris Hartmann, NP  This note was completed with Sales executive.  Any errors are purely unintentional.

## 2019-01-22 ENCOUNTER — Other Ambulatory Visit: Payer: Self-pay

## 2019-01-22 ENCOUNTER — Encounter: Payer: Medicare HMO | Admitting: Internal Medicine

## 2019-01-22 DIAGNOSIS — E11621 Type 2 diabetes mellitus with foot ulcer: Secondary | ICD-10-CM | POA: Diagnosis not present

## 2019-01-22 NOTE — Progress Notes (Signed)
Dylan Robles, Dylan Robles ([id number]) Visit Report for 01/22/2019 Arrival Information Details Patient Name: Dylan Robles, Dylan Robles. Date of Service: 01/22/2019 10:45 AM Medical Record Number: [id number] Patient Account Number: [id number] Date of Birth/Sex: 09/15/1940 (79 y.o. M) Treating RN: Dylan Robles Primary Care Dylan Robles: Dylan Robles Other Clinician: Referring Dylan Robles: Dylan Robles Treating Kristopher Delk/Extender: Tito Dine in Treatment: 1 Visit Information History Since Last Visit Added or deleted any medications: No Patient Arrived: Dylan Robles Any new allergies or adverse reactions: No Arrival Time: 10:46 Had a fall or experienced change in No Accompanied By: wife activities of daily living that may affect Transfer Assistance: None risk of falls: Patient Identification Verified: Yes Signs or symptoms of abuse/neglect since last visito No Secondary Verification Process Yes Hospitalized since last visit: No Completed: Implantable device outside of the clinic excluding No Patient Has Alerts: Yes cellular tissue based products placed in the center Patient Alerts: ABI L.54 R.72 since last visit: 08/2018 Has Dressing in Place as Prescribed: Yes Pain Present Now: No Electronic Signature(s) Signed: 01/22/2019 12:09:48 PM By: Dylan Robles RCP, RRT, CHT Entered By: Dylan Robles on 01/22/2019 10:47:14 Dylan Robles ([id number]) -------------------------------------------------------------------------------- Encounter Discharge Information Details Patient Name: Dylan Robles, Dylan Robles. Date of Service: 01/22/2019 10:45 AM Medical Record Number: [id number] Patient Account Number: [id number] Date of Birth/Sex: 10-20-1940 (79 y.o. M) Treating RN: Dylan Robles Primary Care Dylan Robles: Dylan Robles Other Clinician: Referring Kodee Ravert: Dylan Robles Treating Dylan Robles/Extender: Tito Dine in Treatment: 1 Encounter Discharge Information Items Post  Procedure Vitals Discharge Condition: Stable Temperature (F): 99.1 Ambulatory Status: Cane Pulse (bpm): 81 Discharge Destination: Home Respiratory Rate (breaths/min): 16 Transportation: Private Auto Blood Pressure (mmHg): 147/68 Accompanied By: wife Schedule Follow-up Appointment: Yes Clinical Summary of Care: Electronic Signature(s) Signed: 01/22/2019 4:52:26 PM By: Dylan Robles, BSN, RN, CWS, Kim RN, BSN Entered By: Dylan Robles, BSN, RN, CWS, Dylan Robles on 01/22/2019 11:16:11 Dylan Robles ([id number]) -------------------------------------------------------------------------------- Lower Extremity Assessment Details Patient Name: Dylan Robles, Dylan Robles. Date of Service: 01/22/2019 10:45 AM Medical Record Number: [id number] Patient Account Number: [id number] Date of Birth/Sex: Jun 11, 1940 (79 y.o. M) Treating RN: Dylan Robles Primary Care Kateleen Encarnacion: Dylan Robles Other Clinician: Referring Dylan Robles: Dylan Robles Treating Dylan Robles/Extender: Dylan Robles Weeks in Treatment: 1 Edema Assessment Assessed: [Left: No] [Right: No] Edema: [Left: N] [Right: o] Vascular Assessment Pulses: Dorsalis Pedis Palpable: [Left:Yes] Electronic Signature(s) Signed: 01/22/2019 3:13:50 PM By: Dylan Robles Entered By: Dylan Robles on 01/22/2019 10:53:37 Dylan Robles ([id number]) -------------------------------------------------------------------------------- Multi Wound Chart Details Patient Name: Dylan Robles, Dylan Robles. Date of Service: 01/22/2019 10:45 AM Medical Record Number: [id number] Patient Account Number: [id number] Date of Birth/Sex: [birth year] (79 y.o. M) Treating RN: Dylan Robles Primary Care Jaber Dunlow: Dylan Robles Other Clinician: Referring Dylan Robles: Dylan Robles Treating Dylan Robles/Extender: Tito Dine in Treatment: 1 Vital Signs Height(in): 78 Pulse(bpm): 16 Weight(lbs): 206 Blood Pressure(mmHg): 147/68 Body Mass Index(BMI): 24 Temperature(F): 99.1 Respiratory  Rate 16 (breaths/min): Photos: [N/A:N/A] Wound Location: Left Toe Great - Dorsal Left Foot - Lateral N/A Wounding Event: Gradually Appeared Gradually Appeared N/A Primary Etiology: Diabetic Wound/Ulcer of the Diabetic Wound/Ulcer of the N/A Lower Extremity Lower Extremity Comorbid History: Congestive Heart Failure, Congestive Heart Failure, N/A Hypertension, Type II Diabetes Hypertension, Type II Diabetes Date Acquired: 07/10/2018 08/10/2018 N/A Weeks of Treatment: 1 1 N/A Wound Status: Open Open N/A Measurements L x W x D 0.9x0.8x0.2 0.9x1x0.2 N/A (cm) Area (cm) : 0.565 0.707 N/A Volume (cm) : 0.113 0.141 N/A % Reduction in Area: 14.40% -11.20% N/A % Reduction in Volume: -71.20% -  11.00% N/A Classification: Grade 2 Grade 2 N/A Exudate Amount: Medium Medium N/A Exudate Type: Serous Serous N/A Exudate Color: amber amber N/A Wound Margin: Flat and Intact Flat and Intact N/A Granulation Amount: None Present (0%) Medium (34-66%) N/A Granulation Quality: N/A Pale N/A Necrotic Amount: Medium (34-66%) Medium (34-66%) N/A Exposed Structures: Fat Layer (Subcutaneous Fat Layer (Subcutaneous N/A Tissue) Exposed: Yes Tissue) Exposed: Yes Fascia: No Fascia: No Tendon: No Tendon: No Muscle: No Muscle: No Dylan Robles ([id number]) Joint: No Joint: No Bone: No Bone: No Epithelialization: None None N/A Debridement: Debridement - Excisional Debridement - Excisional N/A Pre-procedure 11:10 11:10 N/A Verification/Time Out Taken: Pain Control: Lidocaine Lidocaine N/A Tissue Debrided: Bone, Subcutaneous, Slough Subcutaneous, Slough N/A Level: Skin/Subcutaneous Skin/Subcutaneous Tissue N/A Tissue/Muscle/Bone Debridement Area (sq cm): 0.72 0.9 N/A Instrument: Curette Curette N/A Bleeding: Minimum Minimum N/A Hemostasis Achieved: Pressure Pressure N/A Debridement Treatment Procedure was tolerated well Procedure was tolerated well N/A Response: Post Debridement 0.9x0.8x0.2 0.9x1x0.3  N/A Measurements L x W x D (cm) Post Debridement Volume: 0.113 0.212 N/A (cm) Procedures Performed: Debridement Debridement N/A Treatment Notes Wound #1 (Left, Dorsal Toe Great) Notes Santyl, gauze and conform Wound #2 (Left, Lateral Foot) Notes Santyl, gauze and conform Electronic Signature(s) Signed: 01/22/2019 4:15:01 PM By: Linton Ham MD Entered By: Linton Ham on 01/22/2019 11:45:41 Dylan Robles ([id number]) -------------------------------------------------------------------------------- Multi-Disciplinary Care Plan Details Patient Name: Dylan Robles, Dylan Robles. Date of Service: 01/22/2019 10:45 AM Medical Record Number: [id number] Patient Account Number: [id number] Date of Birth/Sex: 05-05-1940 (79 y.o. M) Treating RN: Dylan Robles Primary Care Patrici Minnis: Dylan Robles Other Clinician: Referring Amalee Olsen: Dylan Robles Treating Makenley Shimp/Extender: Tito Dine in Treatment: 1 Active Inactive Pain, Acute or Chronic Nursing Diagnoses: Potential alteration in comfort, pain Goals: Patient will verbalize adequate pain control and receive pain control interventions during procedures as needed Date Initiated: 01/15/2019 Target Resolution Date: 02/12/2019 Goal Status: Active Patient/caregiver will verbalize adequate pain control between visits Date Initiated: 01/15/2019 Target Resolution Date: 02/12/2019 Goal Status: Active Interventions: Provide education on pain management Reposition patient for comfort Treatment Activities: Administer pain control measures as ordered : 01/15/2019 Notes: Peripheral Neuropathy Nursing Diagnoses: Knowledge deficit related to disease process and management of peripheral neurovascular dysfunction Goals: Patient/caregiver will verbalize understanding of disease process and disease management Date Initiated: 01/15/2019 Target Resolution Date: 02/12/2019 Goal Status: Active Interventions: Assess signs and symptoms of neuropathy upon  admission and as needed Provide education on Management of Neuropathy and Related Ulcers Notes: Soft Tissue Infection Nursing Diagnoses: Impaired tissue integrity Dylan Robles, Dylan Robles ([id number]) Goals: Patient will remain free of wound infection Date Initiated: 01/15/2019 Target Resolution Date: 01/15/2019 Goal Status: Active Interventions: Assess signs and symptoms of infection every visit Notes: Tissue Oxygenation Nursing Diagnoses: Actual ineffective tissue perfusion; peripheral (select once diagnosis is confirmed) Goals: Invasive arterial studies completed as ordered Date Initiated: 01/15/2019 Target Resolution Date: 02/05/2019 Goal Status: Active Non-invasive arterial studies are completed as ordered Date Initiated: 01/15/2019 Target Resolution Date: 02/05/2019 Goal Status: Active Interventions: Assess patient understanding of disease process and management upon diagnosis and as needed Assess peripheral arterial status upon admission and as needed Notes: Wound/Skin Impairment Nursing Diagnoses: Impaired tissue integrity Goals: Patient/caregiver will verbalize understanding of skin care regimen Date Initiated: 01/15/2019 Target Resolution Date: 02/05/2019 Goal Status: Active Ulcer/skin breakdown will have a volume reduction of 30% by week 4 Date Initiated: 01/15/2019 Target Resolution Date: 02/12/2019 Goal Status: Active Interventions: Assess ulceration(s) every visit Treatment Activities: Referred to DME Azarion Hove for dressing supplies : 01/15/2019 Notes:  Electronic Signature(s) Signed: 01/22/2019 4:52:26 PM By: Dylan Robles, BSN, RN, CWS, Kim RN, BSN [address] [place] ([id number]) Entered By: Dylan Robles, BSN, RN, CWS, Dylan Robles on 01/22/2019 11:09:05 Dylan Robles, Dylan Robles ([id number]) -------------------------------------------------------------------------------- Pain Assessment Details Patient Name: Dylan Robles, Dylan Robles. Date of Service: 01/22/2019 10:45 AM Medical Record Number: [id number] Patient Account  Number: [id number] Date of Birth/Sex: 01/23/1940 (79 y.o. M) Treating RN: Dylan Robles Primary Care Telisha Zawadzki: Dylan Robles Other Clinician: Referring Valor [place]: Dylan Robles Treating Michaelia Beilfuss/Extender: Tito Dine in Treatment: 1 Active Problems Location of Pain Severity and Description of Pain Patient Has Paino No Site Locations Pain Management and Medication Current Pain Management: Electronic Signature(s) Signed: 01/22/2019 12:09:48 PM By: Dylan Robles RCP, RRT, CHT Signed: 01/22/2019 4:52:26 PM By: Dylan Robles, BSN, RN, CWS, Kim RN, BSN Entered By: Dylan Robles on 01/22/2019 10:47:21 Dylan Robles ([id number]) -------------------------------------------------------------------------------- Patient/Caregiver Education Details Patient Name: Dylan Robles, Dylan Robles. Date of Service: 01/22/2019 10:45 AM Medical Record Number: [id number] Patient Account Number: [id number] Date of Birth/Gender: 1940/10/05 (79 y.o. M) Treating RN: Dylan Robles Primary Care Physician: Dylan Robles Other Clinician: Referring Physician: Lucia Robles Treating Physician/Extender: Tito Dine in Treatment: 1 Education Assessment Education Provided To: Patient Education Topics Provided Wound Debridement: Handouts: Wound Debridement Methods: Demonstration, Explain/Verbal Responses: State content correctly Wound/Skin Impairment: Handouts: Caring for Your Ulcer Methods: Demonstration, Explain/Verbal Responses: State content correctly Electronic Signature(s) Signed: 01/22/2019 4:52:26 PM By: Dylan Robles, BSN, RN, CWS, Kim RN, BSN Entered By: Dylan Robles, BSN, RN, CWS, Dylan Robles on 01/22/2019 11:15:20 Dylan Robles ([id number]) -------------------------------------------------------------------------------- Wound Assessment Details Patient Name: Dylan Robles, Dylan Robles. Date of Service: 01/22/2019 10:45 AM Medical Record Number: [id number] Patient Account Number: [id number] Date  of Birth/Sex: [birth year] (79 y.o. M) Treating RN: Dylan Robles Primary Care Ladarrian Asencio: Dylan Robles Other Clinician: Referring Teriann Livingood: Dylan Robles Treating Jaclynne Baldo/Extender: Tito Dine in Treatment: 1 Wound Status Wound Number: 1 Primary Diabetic Wound/Ulcer of the Lower Extremity Etiology: Wound Location: Left Toe Great - Dorsal Wound Status: Open Wounding Event: Gradually Appeared Comorbid Congestive Heart Failure, Hypertension, Date Acquired: 07/10/2018 History: Type II Diabetes Weeks Of Treatment: 1 Clustered Wound: No Photos Wound Measurements Length: (cm) 0.9 % Reduction i Width: (cm) 0.8 % Reduction i Depth: (cm) 0.2 Epithelializa Area: (cm) 0.565 Tunneling: Volume: (cm) 0.113 Undermining: n Area: 14.4% n Volume: -71.2% tion: None No No Wound Description Classification: Grade 2 Foul Odor Aft Wound Margin: Flat and Intact Slough/Fibrin Exudate Amount: Medium Exudate Type: Serous Exudate Color: amber er Cleansing: No o Yes Wound Bed Granulation Amount: None Present (0%) Exposed Structure Necrotic Amount: Medium (34-66%) Fascia Exposed: No Necrotic Quality: Adherent Slough Fat Layer (Subcutaneous Tissue) Exposed: Yes Tendon Exposed: No Muscle Exposed: No Joint Exposed: No Bone Exposed: No Treatment Notes TANDY, LUCKADOO ([id number]) Wound #1 (Left, Dorsal Toe Great) Notes Santyl, gauze and conform Electronic Signature(s) Signed: 01/22/2019 3:13:50 PM By: Dylan Robles Entered By: Dylan Robles on 01/22/2019 10:51:39 Dylan Robles ([id number]) -------------------------------------------------------------------------------- Wound Assessment Details Patient Name: Dylan Robles, Dylan Robles. Date of Service: 01/22/2019 10:45 AM Medical Record Number: [id number] Patient Account Number: [id number] Date of Birth/Sex: [birth year] (79 y.o. M) Treating RN: Dylan Robles Primary Care Yigit Norkus: Dylan Robles Other Clinician: Referring Cristianna Cyr:  Dylan Robles Treating Rechy Bost/Extender: Tito Dine in Treatment: 1 Wound Status Wound Number: 2 Primary Diabetic Wound/Ulcer of the Lower Extremity Etiology: Wound Location: Left Foot - Lateral Wound Status: Open Wounding Event: Gradually Appeared Comorbid Congestive Heart Failure, Hypertension, Date Acquired: 08/10/2018 History: Type II Diabetes Weeks Of  Treatment: 1 Clustered Wound: No Photos Wound Measurements Length: (cm) 0.9 % Reduction i Width: (cm) 1 % Reduction i Depth: (cm) 0.2 Epithelializa Area: (cm) 0.707 Tunneling: Volume: (cm) 0.141 Undermining: n Area: -11.2% n Volume: -11% tion: None No No Wound Description Classification: Grade 2 Foul Odor Aft Wound Margin: Flat and Intact Slough/Fibrin Exudate Amount: Medium Exudate Type: Serous Exudate Color: amber er Cleansing: No o Yes Wound Bed Granulation Amount: Medium (34-66%) Exposed Structure Granulation Quality: Pale Fascia Exposed: No Necrotic Amount: Medium (34-66%) Fat Layer (Subcutaneous Tissue) Exposed: Yes Necrotic Quality: Adherent Slough Tendon Exposed: No Muscle Exposed: No Joint Exposed: No Bone Exposed: No Treatment Notes ATHEN, BALAGOT ([id number]) Wound #2 (Left, Lateral Foot) Notes Santyl, gauze and conform Electronic Signature(s) Signed: 01/22/2019 3:13:50 PM By: Dylan Robles Entered By: Dylan Robles on 01/22/2019 10:52:02 Dylan Robles ([id number]) -------------------------------------------------------------------------------- Vitals Details Patient Name: Dylan Robles. Date of Service: 01/22/2019 10:45 AM Medical Record Number: [id number] Patient Account Number: [id number] Date of Birth/Sex: 01-Feb-1940 (79 y.o. M) Treating RN: Dylan Robles Primary Care Jamillia Closson: Dylan Robles Other Clinician: Referring Laurens Matheny: Dylan Robles Treating Cortland Crehan/Extender: Tito Dine in Treatment: 1 Vital Signs Time Taken: 10:45 Temperature (F):  99.1 Height (in): 78 Pulse (bpm): 81 Weight (lbs): 206 Respiratory Rate (breaths/min): 16 Body Mass Index (BMI): 23.8 Blood Pressure (mmHg): 147/68 Reference Range: 80 - 120 mg / dl Electronic Signature(s) Signed: 01/22/2019 12:09:48 PM By: Dylan Robles RCP, RRT, CHT Entered By: Becky Sax, Amado Nash on 01/22/2019 10:49:45

## 2019-01-22 NOTE — Progress Notes (Signed)
HAOXUAN, ORSO ([id number]) Visit Report for 01/22/2019 Debridement Details Patient Name: DECODA, DELMASTRO. Date of Service: 01/22/2019 10:45 AM Medical Record Number: [id number] Patient Account Number: [id number] Date of Birth/Sex: 08/09/1940 (79 y.o. M) Treating RN: Cornell Barman Primary Care Provider: Lucia Gaskins Other Clinician: Referring Provider: Lucia Gaskins Treating Provider/Extender: Tito Dine in Treatment: 1 Debridement Performed for Wound #1 Left,Dorsal Toe Great Assessment: Performed By: Physician Ricard Dillon, MD Debridement Type: Debridement Severity of Tissue Pre Necrosis of bone Debridement: Level of Consciousness (Pre- Awake and Alert procedure): Pre-procedure Verification/Time Yes - 11:10 Out Taken: Start Time: 11:10 Pain Control: Lidocaine Total Area Debrided (L x W): 0.9 (cm) x 0.8 (cm) = 0.72 (cm) Tissue and other material Viable, Non-Viable, Bone, Slough, Subcutaneous, Slough debrided: Level: Skin/Subcutaneous Tissue/Muscle/Bone Debridement Description: Excisional Instrument: Curette Bleeding: Minimum Hemostasis Achieved: Pressure End Time: 11:13 Response to Treatment: Procedure was tolerated well Level of Consciousness Awake and Alert (Post-procedure): Post Debridement Measurements of Total Wound Length: (cm) 0.9 Width: (cm) 0.8 Depth: (cm) 0.2 Volume: (cm) 0.113 Character of Wound/Ulcer Post Debridement: Stable Severity of Tissue Post Debridement: Necrosis of bone Post Procedure Diagnosis Same as Pre-procedure Electronic Signature(s) Signed: 01/22/2019 4:15:01 PM By: Linton Ham MD Signed: 01/22/2019 4:52:26 PM By: Gretta Cool, BSN, RN, CWS, Kim RN, BSN Entered By: Linton Ham on 01/22/2019 11:45:53 [name] ([id number]) -------------------------------------------------------------------------------- Debridement Details Patient Name: DIVYANSH, LYMON. Date of Service: 01/22/2019 10:45 AM Medical Record  Number: [id number] Patient Account Number: [id number] Date of Birth/Sex: 02-Sep-1940 (79 y.o. M) Treating RN: Cornell Barman Primary Care Provider: Lucia Gaskins Other Clinician: Referring Provider: Lucia Gaskins Treating Provider/Extender: Tito Dine in Treatment: 1 Debridement Performed for Wound #2 Left,Lateral Foot Assessment: Performed By: Physician Ricard Dillon, MD Debridement Type: Debridement Severity of Tissue Pre Fat layer exposed Debridement: Level of Consciousness (Pre- Awake and Alert procedure): Pre-procedure Verification/Time Yes - 11:10 Out Taken: Start Time: 11:10 Pain Control: Lidocaine Total Area Debrided (L x W): 0.9 (cm) x 1 (cm) = 0.9 (cm) Tissue and other material Viable, Non-Viable, Slough, Subcutaneous, Slough debrided: Level: Skin/Subcutaneous Tissue Debridement Description: Excisional Instrument: Curette Bleeding: Minimum Hemostasis Achieved: Pressure End Time: 11:13 Response to Treatment: Procedure was tolerated well Level of Consciousness Awake and Alert (Post-procedure): Post Debridement Measurements of Total Wound Length: (cm) 0.9 Width: (cm) 1 Depth: (cm) 0.3 Volume: (cm) 0.212 Character of Wound/Ulcer Post Debridement: Stable Severity of Tissue Post Debridement: Fat layer exposed Post Procedure Diagnosis Same as Pre-procedure Electronic Signature(s) Signed: 01/22/2019 4:15:01 PM By: Linton Ham MD Signed: 01/22/2019 4:52:26 PM By: Gretta Cool, BSN, RN, CWS, Kim RN, BSN Entered By: Linton Ham on 01/22/2019 11:46:04 [name] ([id number]) -------------------------------------------------------------------------------- HPI Details Patient Name: MCNEAL, MILLET. Date of Service: 01/22/2019 10:45 AM Medical Record Number: [id number] Patient Account Number: [id number] Date of Birth/Sex: 12/26/1940 (79 y.o. M) Treating RN: Cornell Barman Primary Care Provider: Lucia Gaskins Other Clinician: Referring Provider:  Lucia Gaskins Treating Provider/Extender: Tito Dine in Treatment: 1 History of Present Illness HPI Description: ADMISSION 01/15/2019 Patient is a pleasant 79 year old type II diabetic man who arrives accompanied by his wife from [place]. His story begins in early September 2020. He developed purulence from the corner of his left great toe. He saw his podiatrist Dr. Sharyon Cable who diagnosed a severe chronic paronychia. He had total avulsion of the nail because of the paronychia today on 9/17. He was followed weekly by Dr. Sharyon Cable developed a nonhealing area on the nailbed of  the left great toe although his pain was improved. On 10/20/2018 he was noted to have a new wound on the left lateral foot to which she is was applying Neosporin. His dressings were changed to peroxide to the nailbed and Silvadene on 10/27. On 12/7 it was noted that he had had previous Dopplers ordered by his primary care doctor in August. These were really quite poor. ABI on the right was 0.72, 0.44 TBI. On the left 0.54 ABI with a TBI of 0.20. Monophasic waveforms bilaterally. He was referred to Dr. Delana Meyer. On 12/15 he underwent an angiogram and had a stent placed in the left superficial femoral artery along with angioplasty. Since then his pain is a lot better. He is still using Silvadene to the wounds. He was prescribed doxycycline about 2 weeks ago which he finished last week. Past medical history; type 2 diabetes, BPH, hypertension, coronary artery disease, hyperlipidemia and PAD ABIs were not obtainable in the clinic today. 1/13; type II diabetic man who has wounds on the left great toenail bed on his first toe and the left lateral foot. Both of these wounds accompanied by really significant PAD status post stenting of the left superficial femoral artery by Dr. Delana Meyer on 12/15. He is not describing claudication pain however he seems to be limiting his activity. We have been using Santyl to  both wound areas. X-ray of the left foot did not show any osseous erosions or periostitis to suggest osteomyelitis. Electronic Signature(s) Signed: 01/22/2019 4:15:01 PM By: Linton Ham MD Entered By: Linton Ham on 01/22/2019 12:25:27 [name] ([id number]) -------------------------------------------------------------------------------- Physical Exam Details Patient Name: AVAAN, VINK. Date of Service: 01/22/2019 10:45 AM Medical Record Number: [id number] Patient Account Number: [id number] Date of Birth/Sex: 1940/07/24 (79 y.o. M) Treating RN: Cornell Barman Primary Care Provider: Lucia Gaskins Other Clinician: Referring Provider: Lucia Gaskins Treating Provider/Extender: Tito Dine in Treatment: 1 Constitutional Patient is hypertensive.. Pulse regular and within target range for patient.[name] Respirations regular, non-labored and within target range.. Temperature is normal and within the target range for the patient.[name] appears in no distress. Cardiovascular Pedal pulses are absent.. Notes Wound exam oNailbed of the left great toe. There is exposed bone here. We noted this last week. Still debriding with a #3 curette but there is very little in terms of healthy granulation. Nevertheless the wound measured smaller here oOn the lateral left foot just proximal to the fifth MTP. Also debrided of necrotic subcutaneous tissue and cleans up quite nicely. Bleeding noted. Hemostasis with direct pressure Electronic Signature(s) Signed: 01/22/2019 4:15:01 PM By: Linton Ham MD Entered By: Linton Ham on 01/22/2019 11:49:41 [name] ([id number]) -------------------------------------------------------------------------------- Physician Orders Details Patient Name: GIDDEON, BESSINGER. Date of Service: 01/22/2019 10:45 AM Medical Record Number: [id number] Patient Account Number: [id number] Date of Birth/Sex: 08/03/1940 (79 y.o. M) Treating RN: Cornell Barman Primary  Care Provider: Lucia Gaskins Other Clinician: Referring Provider: Lucia Gaskins Treating Provider/Extender: Tito Dine in Treatment: 1 Verbal / Phone Orders: No Diagnosis Coding Wound Cleansing Wound #1 Left,Dorsal Toe Great o Clean wound with Normal Saline. o May Shower, gently pat wound dry prior to applying new dressing. Wound #2 Left,Lateral Foot o Clean wound with Normal Saline. o May Shower, gently pat wound dry prior to applying new dressing. Anesthetic (add to Medication List) Wound #1 Left,Dorsal Toe Great o Topical Lidocaine 4% cream applied to wound bed prior to debridement (In Clinic Only). Wound #2 Left,Lateral Foot o Topical Lidocaine 4% cream applied  to wound bed prior to debridement (In Clinic Only). Primary Wound Dressing Wound #1 Left,Dorsal Toe Great o Santyl Ointment Wound #2 Left,Lateral Foot o Santyl Ointment Secondary Dressing Wound #1 Left,Dorsal Toe Great o Gauze and Kerlix/Conform Wound #2 Left,Lateral Foot o Gauze and Kerlix/Conform Dressing Change Frequency Wound #1 Left,Dorsal Toe Great o Change dressing every day. Wound #2 Left,Lateral Foot o Change dressing every day. Follow-up Appointments Wound #1 Left,Dorsal Toe Great o Return Appointment in 1 week. Wound #2 Left,Lateral Foot o Return Appointment in 1 week. BENECIO, BLOOD ([id number]) Edema Control Wound #1 Left,Dorsal Toe Great o Elevate legs to the level of the heart and pump ankles as often as possible Wound #2 Left,Lateral Foot o Elevate legs to the level of the heart and pump ankles as often as possible Electronic Signature(s) Signed: 01/22/2019 4:15:01 PM By: Linton Ham MD Signed: 01/22/2019 4:52:26 PM By: Gretta Cool, BSN, RN, CWS, Kim RN, BSN Entered By: Gretta Cool, BSN, RN, CWS, Kim on 01/22/2019 11:14:45 NEEDHAM, MACGOWAN ([id number]) -------------------------------------------------------------------------------- Problem List  Details Patient Name: CHANZ, NEISWONGER. Date of Service: 01/22/2019 10:45 AM Medical Record Number: [id number] Patient Account Number: [id number] Date of Birth/Sex: 1940-02-26 (79 y.o. M) Treating RN: Cornell Barman Primary Care Provider: Lucia Gaskins Other Clinician: Referring Provider: Lucia Gaskins Treating Provider/Extender: Tito Dine in Treatment: 1 Active Problems ICD-10 Evaluated Encounter Code Description Active Date Today Diagnosis E11.621 Type 2 diabetes mellitus with foot ulcer 01/15/2019 No Yes E11.51 Type 2 diabetes mellitus with diabetic peripheral angiopathy 01/15/2019 No Yes without gangrene L97.524 Non-pressure chronic ulcer of other part of left foot with 01/15/2019 No Yes necrosis of bone L97.528 Non-pressure chronic ulcer of other part of left foot with other 01/15/2019 No Yes specified severity E11.42 Type 2 diabetes mellitus with diabetic polyneuropathy 01/15/2019 No Yes Inactive Problems Resolved Problems Electronic Signature(s) Signed: 01/22/2019 4:15:01 PM By: Linton Ham MD Entered By: Linton Ham on 01/22/2019 11:45:34 [name] ([id number]) -------------------------------------------------------------------------------- Progress Note Details Patient Name: MELCHIZEDEK, LEACHMAN. Date of Service: 01/22/2019 10:45 AM Medical Record Number: [id number] Patient Account Number: [id number] Date of Birth/Sex: [birth year] (79 y.o. M) Treating RN: Cornell Barman Primary Care Provider: Lucia Gaskins Other Clinician: Referring Provider: Lucia Gaskins Treating Provider/Extender: Tito Dine in Treatment: 1 Subjective History of Present Illness (HPI) ADMISSION 01/15/2019 Patient is a pleasant 79 year old type II diabetic man who arrives accompanied by his wife from [place]. His story begins in early September 2020. He developed purulence from the corner of his left great toe. He saw his podiatrist Dr. Sharyon Cable who diagnosed a  severe chronic paronychia. He had total avulsion of the nail because of the paronychia today on 9/17. He was followed weekly by Dr. Sharyon Cable developed a nonhealing area on the nailbed of the left great toe although his pain was improved. On 10/20/2018 he was noted to have a new wound on the left lateral foot to which she is was applying Neosporin. His dressings were changed to peroxide to the nailbed and Silvadene on 10/27. On 12/7 it was noted that he had had previous Dopplers ordered by his primary care doctor in August. These were really quite poor. ABI on the right was 0.72, 0.44 TBI. On the left 0.54 ABI with a TBI of 0.20. Monophasic waveforms bilaterally. He was referred to Dr. Delana Meyer. On 12/15 he underwent an angiogram and had a stent placed in the left superficial femoral artery along with angioplasty. Since then his pain is a lot better. He  is still using Silvadene to the wounds. He was prescribed doxycycline about 2 weeks ago which he finished last week. Past medical history; type 2 diabetes, BPH, hypertension, coronary artery disease, hyperlipidemia and PAD ABIs were not obtainable in the clinic today. 1/13; type II diabetic man who has wounds on the left great toenail bed on his first toe and the left lateral foot. Both of these wounds accompanied by really significant PAD status post stenting of the left superficial femoral artery by Dr. Delana Meyer on 12/15. He is not describing claudication pain however he seems to be limiting his activity. We have been using Santyl to both wound areas. X-ray of the left foot did not show any osseous erosions or periostitis to suggest osteomyelitis. Objective Constitutional Patient is hypertensive.. Pulse regular and within target range for patient.[name] Respirations regular, non-labored and within target range.. Temperature is normal and within the target range for the patient.[name] appears in no distress. Vitals Time Taken: 10:45 AM, Height: 78 in, Weight:  206 lbs, BMI: 23.8, Temperature: 99.1 F, Pulse: 81 bpm, Respiratory Rate: 16 breaths/min, Blood Pressure: 147/68 mmHg. Cardiovascular Pedal pulses are absent.DARRYLE, MAHFOUZ ([id number]) General Notes: Wound exam Nailbed of the left great toe. There is exposed bone here. We noted this last week. Still debriding with a #3 curette but there is very little in terms of healthy granulation. Nevertheless the wound measured smaller here On the lateral left foot just proximal to the fifth MTP. Also debrided of necrotic subcutaneous tissue and cleans up quite nicely. Bleeding noted. Hemostasis with direct pressure Integumentary (Hair, Skin) Wound #1 status is Open. Original cause of wound was Gradually Appeared. The wound is located on the [employer]. The wound measures 0.9cm length x 0.8cm width x 0.2cm depth; 0.565cm^2 area and 0.113cm^3 volume. There is Fat Layer (Subcutaneous Tissue) Exposed exposed. There is no tunneling or undermining noted. There is a medium amount of serous drainage noted. The wound margin is flat and intact. There is no granulation within the wound bed. There is a medium (34-66%) amount of necrotic tissue within the wound bed including Adherent Slough. Wound #2 status is Open. Original cause of wound was Gradually Appeared. The wound is located on the Left,Lateral Foot. The wound measures 0.9cm length x 1cm width x 0.2cm depth; 0.707cm^2 area and 0.141cm^3 volume. There is Fat Layer (Subcutaneous Tissue) Exposed exposed. There is no tunneling or undermining noted. There is a medium amount of serous drainage noted. The wound margin is flat and intact. There is medium (34-66%) pale granulation within the wound bed. There is a medium (34-66%) amount of necrotic tissue within the wound bed including Adherent Slough. Assessment Active Problems ICD-10 Type 2 diabetes mellitus with foot ulcer Type 2 diabetes mellitus with diabetic peripheral angiopathy without  gangrene Non-pressure chronic ulcer of other part of left foot with necrosis of bone Non-pressure chronic ulcer of other part of left foot with other specified severity Type 2 diabetes mellitus with diabetic polyneuropathy Procedures Wound #1 Pre-procedure diagnosis of Wound #1 is a Diabetic Wound/Ulcer of the Lower Extremity located on the Left,Dorsal Toe Great .Severity of Tissue Pre Debridement is: Necrosis of bone. There was a Excisional Skin/Subcutaneous Tissue/Muscle/Bone Debridement with a total area of 0.72 sq cm performed by Ricard Dillon, MD. With the following instrument(s): Curette to remove Viable and Non-Viable tissue/material. Material removed includes Bone,Subcutaneous Tissue, and Slough after achieving pain control using Lidocaine. No specimens were taken. A time out was conducted at 11:10, prior  to the start of the procedure. A Minimum amount of bleeding was controlled with Pressure. The procedure was tolerated well. Post Debridement Measurements: 0.9cm length x 0.8cm width x 0.2cm depth; 0.113cm^3 volume. Character of Wound/Ulcer Post Debridement is stable. Severity of Tissue Post Debridement is: Necrosis of bone. Post procedure Diagnosis Wound #1: Same as Pre-Procedure Wound #2 Pre-procedure diagnosis of Wound #2 is a Diabetic Wound/Ulcer of the Lower Extremity located on the Left,Lateral Foot .Severity of Tissue Pre Debridement is: Fat layer exposed. There was a Excisional Skin/Subcutaneous Tissue Debridement with a total area of 0.9 sq cm performed by Ricard Dillon, MD. With the following instrument(s): KARDIER, FRINK ([id number]) Curette to remove Viable and Non-Viable tissue/material. Material removed includes Subcutaneous Tissue and Slough and after achieving pain control using Lidocaine. No specimens were taken. A time out was conducted at 11:10, prior to the start of the procedure. A Minimum amount of bleeding was controlled with Pressure. The procedure was  tolerated well. Post Debridement Measurements: 0.9cm length x 1cm width x 0.3cm depth; 0.212cm^3 volume. Character of Wound/Ulcer Post Debridement is stable. Severity of Tissue Post Debridement is: Fat layer exposed. Post procedure Diagnosis Wound #2: Same as Pre-Procedure Plan Wound Cleansing: Wound #1 Left,Dorsal Toe Great: Clean wound with Normal Saline. May Shower, gently pat wound dry prior to applying new dressing. Wound #2 Left,Lateral Foot: Clean wound with Normal Saline. May Shower, gently pat wound dry prior to applying new dressing. Anesthetic (add to Medication List): Wound #1 Left,Dorsal Toe Great: Topical Lidocaine 4% cream applied to wound bed prior to debridement (In Clinic Only). Wound #2 Left,Lateral Foot: Topical Lidocaine 4% cream applied to wound bed prior to debridement (In Clinic Only). Primary Wound Dressing: Wound #1 Left,Dorsal Toe Great: Santyl Ointment Wound #2 Left,Lateral Foot: Santyl Ointment Secondary Dressing: Wound #1 Left,Dorsal Toe Great: Gauze and Kerlix/Conform Wound #2 Left,Lateral Foot: Gauze and Kerlix/Conform Dressing Change Frequency: Wound #1 Left,Dorsal Toe Great: Change dressing every day. Wound #2 Left,Lateral Foot: Change dressing every day. Follow-up Appointments: Wound #1 Left,Dorsal Toe Great: Return Appointment in 1 week. Wound #2 Left,Lateral Foot: Return Appointment in 1 week. Edema Control: Wound #1 Left,Dorsal Toe Great: Elevate legs to the level of the heart and pump ankles as often as possible Wound #2 Left,Lateral Foot: Elevate legs to the level of the heart and pump ankles as often as possible 1. I am continuing with Santyl to both wound areas 2. The most worrisome area is the tip of the left great toe and the remanent of the nail matrix. This has exposed bone. Sometimes in my experience if there is granulation here knocking the bone back will sometimes allow healing. I did not see a viable enough surface today.  We will continue to be vigilant KAVI, UNZICKER ([id number]) 3. The area on the left lateral foot looks satisfactory in terms of the wound surface although no improvement in surface area 4. At this point no MRI. Bone biopsy and culture may be necessary Electronic Signature(s) Signed: 01/22/2019 12:26:34 PM By: Linton Ham MD Entered By: Linton Ham on 01/22/2019 12:26:33 [name] ([id number]) -------------------------------------------------------------------------------- [place] Details Patient Name: GARLAN, ALBARES. Date of Service: 01/22/2019 Medical Record Number: [id number] Patient Account Number: [id number] Date of Birth/Sex: [birth year] (79 y.o. M) Treating RN: Cornell Barman Primary Care Provider: Lucia Gaskins Other Clinician: Referring Provider: Lucia Gaskins Treating Provider/Extender: Tito Dine in Treatment: 1 Diagnosis Coding ICD-10 Codes Code Description [phone] Type 2 diabetes mellitus with foot ulcer E11.51  Type 2 diabetes mellitus with diabetic peripheral angiopathy without gangrene L97.524 Non-pressure chronic ulcer of other part of left foot with necrosis of bone L97.528 Non-pressure chronic ulcer of other part of left foot with other specified severity E11.42 Type 2 diabetes mellitus with diabetic polyneuropathy Facility Procedures CPT4 Code Description: [id number] - DEB SUBQ TISSUE 20 SQ CM/< ICD-10 Diagnosis Description L97.528 Non-pressure chronic ulcer of other part of left foot with other Modifier: specified severity Quantity: 1 CPT4 Code Description: [id number] - DEB BONE 20 SQ CM/< ICD-10 Diagnosis Description L97.524 Non-pressure chronic ulcer of other part of left foot with necro Modifier: sis of bone Quantity: 1 Physician Procedures CPT4: Description Modifier Quantity Code [id number] - WC PHYS SUBQ TISS 20 SQ CM 1 ICD-10 Diagnosis Description L97.528 Non-pressure chronic ulcer of other part of left foot with other  specified severity [id number] Debridement; bone (includes epidermis, dermis, subQ tissue, muscle and/or fascia, if 1 performed) 1st 20 sqcm or less ICD-10 Diagnosis Description L97.524 Non-pressure chronic ulcer of other part of left foot with necrosis of bone Electronic Signature(s) Signed: 01/22/2019 4:15:01 PM By: Linton Ham MD Entered By: Linton Ham on 01/22/2019 11:55:10

## 2019-01-29 ENCOUNTER — Other Ambulatory Visit: Payer: Self-pay

## 2019-01-29 ENCOUNTER — Encounter: Payer: Medicare HMO | Admitting: Internal Medicine

## 2019-01-29 DIAGNOSIS — E11621 Type 2 diabetes mellitus with foot ulcer: Secondary | ICD-10-CM | POA: Diagnosis not present

## 2019-01-30 ENCOUNTER — Other Ambulatory Visit
Admission: RE | Admit: 2019-01-30 | Discharge: 2019-01-30 | Disposition: A | Discharge status: Home / Self Care | Payer: Medicare HMO | Source: Ambulatory Visit | Attending: Internal Medicine | Admitting: Internal Medicine

## 2019-01-30 DIAGNOSIS — B999 Unspecified infectious disease: Secondary | ICD-10-CM | POA: Diagnosis present

## 2019-02-01 LAB — AEROBIC CULTURE W GRAM STAIN (SUPERFICIAL SPECIMEN): Gram Stain: NONE SEEN

## 2019-02-05 ENCOUNTER — Encounter: Payer: Medicare HMO | Admitting: Internal Medicine

## 2019-02-05 ENCOUNTER — Other Ambulatory Visit: Payer: Self-pay

## 2019-02-05 DIAGNOSIS — E11621 Type 2 diabetes mellitus with foot ulcer: Secondary | ICD-10-CM | POA: Diagnosis not present

## 2019-02-05 NOTE — Progress Notes (Addendum)
Dylan Robles ([id number]) Visit Report for 02/05/2019 Arrival Information Details Patient Name: Dylan Robles, Dylan Robles. Date of Service: 02/05/2019 1:45 PM Medical Record Number: [id number] Patient Account Number: [id number] Date of Birth/Sex: [birth year] (79 y.o. M) Treating RN: Army Melia Primary Care Tarry Blayney: Lucia Gaskins Other Clinician: Referring Terica Yogi: Lucia Gaskins Treating Kellar Westberg/Extender: Tito Dine in Treatment: 3 Visit Information History Since Last Visit Added or deleted any medications: No Patient Arrived: Wheel Chair Any new allergies or adverse reactions: No Arrival Time: 14:08 Had a fall or experienced change in No Accompanied By: wife activities of daily living that may affect Transfer Assistance: None risk of falls: Patient Identification Verified: Yes Signs or symptoms of abuse/neglect since last visito No Patient Has Alerts: Yes Hospitalized since last visit: No Patient Alerts: ABI L.54 R.72 08/2018 Has Dressing in Place as Prescribed: Yes Pain Present Now: Yes Electronic Signature(s) Signed: 02/05/2019 2:35:34 PM By: Army Melia Entered By: Army Melia on 02/05/2019 14:08:37 [name] ([id number]) -------------------------------------------------------------------------------- Encounter Discharge Information Details Patient Name: Dylan Robles. Date of Service: 02/05/2019 1:45 PM Medical Record Number: [id number] Patient Account Number: [id number] Date of Birth/Sex: January 06, 1941 (79 y.o. M) Treating RN: Cornell Barman Primary Care Ellanore Vanhook: Lucia Gaskins Other Clinician: Referring Ameriah Lint: Lucia Gaskins Treating Nakeesha Bowler/Extender: Tito Dine in Treatment: 3 Encounter Discharge Information Items Post Procedure Vitals Discharge Condition: Stable Temperature (F): 99.1 Ambulatory Status: Ambulatory Pulse (bpm): 100 Discharge Destination: Home Respiratory Rate (breaths/min): 16 Transportation: Private Auto Blood  Pressure (mmHg): 97/59 Accompanied By: self Schedule Follow-up Appointment: Yes Clinical Summary of Care: Electronic Signature(s) Signed: 02/28/2019 11:50:22 AM By: Gretta Cool, BSN, RN, CWS, Kim RN, BSN Entered By: Gretta Cool, BSN, RN, CWS, Kim on 02/05/2019 14:51:03 [name] ([id number]) -------------------------------------------------------------------------------- Lower Extremity Assessment Details Patient Name: Dylan Robles. Date of Service: 02/05/2019 1:45 PM Medical Record Number: [id number] Patient Account Number: [id number] Date of Birth/Sex: Jan 17, 1940 (79 y.o. M) Treating RN: Army Melia Primary Care Martavion Couper: Lucia Gaskins Other Clinician: Referring Venida Tsukamoto: Lucia Gaskins Treating Feleica Fulmore/Extender: Ricard Dillon Weeks in Treatment: 3 Edema Assessment Assessed: [Left: No] [Right: No] Edema: [Left: N] [Right: o] Vascular Assessment Pulses: Dorsalis Pedis Palpable: [Left:Yes] Electronic Signature(s) Signed: 02/05/2019 2:35:34 PM By: Army Melia Entered By: Army Melia on 02/05/2019 14:14:55 [name] ([id number]) -------------------------------------------------------------------------------- Multi Wound Chart Details Patient Name: Dylan Robles. Date of Service: 02/05/2019 1:45 PM Medical Record Number: [id number] Patient Account Number: [id number] Date of Birth/Sex: August 07, 1940 (79 y.o. M) Treating RN: Cornell Barman Primary Care Serrita Lueth: Lucia Gaskins Other Clinician: Referring Dezaria Methot: Lucia Gaskins Treating Serine Kea/Extender: Tito Dine in Treatment: 3 Vital Signs Height(in): 78 Pulse(bpm): 100 Weight(lbs): 206 Blood Pressure(mmHg): 97/59 Body Mass Index(BMI): 24 Temperature(F): 99.1 Respiratory Rate 16 (breaths/min): Photos: [N/A:N/A] Wound Location: Left Toe Great - Dorsal Left Foot - Lateral N/A Wounding Event: Gradually Appeared Gradually Appeared N/A Primary Etiology: Diabetic Wound/Ulcer of the Diabetic  Wound/Ulcer of the N/A Lower Extremity Lower Extremity Comorbid History: Congestive Heart Failure, Congestive Heart Failure, N/A Hypertension, Type II Diabetes Hypertension, Type II Diabetes Date Acquired: 07/10/2018 08/10/2018 N/A Weeks of Treatment: 3 3 N/A Wound Status: Open Open N/A Measurements L x W x D 0.9x0.5x0.2 0.5x1x0.2 N/A (cm) Area (cm) : 0.353 0.393 N/A Volume (cm) : 0.071 0.079 N/A % Reduction in Area: 46.50% 38.20% N/A % Reduction in Volume: -7.60% 37.80% N/A Classification: Grade 2 Grade 2 N/A Exudate Amount: Medium Medium N/A Exudate Type: Serous Serous N/A Exudate Color: amber amber N/A Wound Margin: Flat and Intact  Flat and Intact N/A Granulation Amount: Small (1-33%) Medium (34-66%) N/A Granulation Quality: N/A Pale N/A Necrotic Amount: Large (67-100%) Medium (34-66%) N/A Necrotic Tissue: Eschar, Adherent Leander N/A Exposed Structures: Fat Layer (Subcutaneous Fat Layer (Subcutaneous N/A Tissue) Exposed: Yes Tissue) Exposed: Yes Fascia: No Fascia: No Tendon: No Tendon: No Muscle: No Muscle: No [name], [name] ([id number]) Joint: No Joint: No Bone: No Bone: No Epithelialization: None None N/A Debridement: N/A Debridement - Excisional N/A Pre-procedure N/A 14:20 N/A Verification/Time Out Taken: Pain Control: N/A Lidocaine N/A Tissue Debrided: N/A Subcutaneous, Slough N/A Level: N/A Skin/Subcutaneous Tissue N/A Debridement Area (sq cm): N/A 0.5 N/A Instrument: N/A Curette N/A Bleeding: N/A Minimum N/A Hemostasis Achieved: N/A Pressure N/A Debridement Treatment N/A Procedure was tolerated well N/A Response: Post Debridement N/A 0.5x1x0.3 N/A Measurements L x W x D (cm) Post Debridement Volume: N/A 0.118 N/A (cm) Procedures Performed: N/A Debridement N/A Treatment Notes Wound #1 (Left, Dorsal Toe Great) 1. Cleansed with: Cleanse wound with antibacterial soap and water 2. Anesthetic Topical Lidocaine 4% cream to wound bed prior  to debridement 4. Dressing Applied: Other dressing (specify in notes) 5. Secondary Dressing Applied ABD Pad Notes Silvercell, bad, conform, tape Wound #2 (Left, Lateral Foot) 1. Cleansed with: Cleanse wound with antibacterial soap and water 2. Anesthetic Topical Lidocaine 4% cream to wound bed prior to debridement 4. Dressing Applied: Other dressing (specify in notes) 5. Secondary Dressing Applied ABD Pad Notes Silvercell, bad, conform, tape Electronic Signature(s) Signed: 02/05/2019 4:18:13 PM By: Linton Ham MD Entered By: Linton Ham on 02/05/2019 15:28:21 KAYSEN, FLORA ([id number]) KAINEN, YAX ([id number]) -------------------------------------------------------------------------------- Multi-Disciplinary Care Plan Details Patient Name: ZACKARIA, HONTZ. Date of Service: 02/05/2019 1:45 PM Medical Record Number: [id number] Patient Account Number: [id number] Date of Birth/Sex: [birth year] (79 y.o. M) Treating RN: Cornell Barman Primary Care Darik Massing: Lucia Gaskins Other Clinician: Referring Shanele Nissan: Lucia Gaskins Treating Haliegh Khurana/Extender: Tito Dine in Treatment: 3 Active Inactive Pain, Acute or Chronic Nursing Diagnoses: Potential alteration in comfort, pain Goals: Patient will verbalize adequate pain control and receive pain control interventions during procedures as needed Date Initiated: 01/15/2019 Target Resolution Date: 02/12/2019 Goal Status: Active Patient/caregiver will verbalize adequate pain control between visits Date Initiated: 01/15/2019 Target Resolution Date: 02/12/2019 Goal Status: Active Interventions: Provide education on pain management Reposition patient for comfort Treatment Activities: Administer pain control measures as ordered : 01/15/2019 Notes: Peripheral Neuropathy Nursing Diagnoses: Knowledge deficit related to disease process and management of peripheral neurovascular dysfunction Goals: Patient/caregiver will  verbalize understanding of disease process and disease management Date Initiated: 01/15/2019 Target Resolution Date: 02/12/2019 Goal Status: Active Interventions: Assess signs and symptoms of neuropathy upon admission and as needed Provide education on Management of Neuropathy and Related Ulcers Notes: Soft Tissue Infection Nursing Diagnoses: Impaired tissue integrity EMILIAN, HINNENKAMP ([id number]) Goals: Patient will remain free of wound infection Date Initiated: 01/15/2019 Target Resolution Date: 01/15/2019 Goal Status: Active Interventions: Assess signs and symptoms of infection every visit Notes: Tissue Oxygenation Nursing Diagnoses: Actual ineffective tissue perfusion; peripheral (select once diagnosis is confirmed) Goals: Invasive arterial studies completed as ordered Date Initiated: 01/15/2019 Target Resolution Date: 02/05/2019 Goal Status: Active Non-invasive arterial studies are completed as ordered Date Initiated: 01/15/2019 Target Resolution Date: 02/05/2019 Goal Status: Active Interventions: Assess patient understanding of disease process and management upon diagnosis and as needed Assess peripheral arterial status upon admission and as needed Notes: Wound/Skin Impairment Nursing Diagnoses: Impaired tissue integrity Goals: Patient/caregiver will verbalize understanding of skin care regimen Date Initiated:  01/15/2019 Target Resolution Date: 02/05/2019 Goal Status: Active Ulcer/skin breakdown will have a volume reduction of 30% by week 4 Date Initiated: 01/15/2019 Target Resolution Date: 02/12/2019 Goal Status: Active Interventions: Assess ulceration(s) every visit Treatment Activities: Referred to DME Tandy Lewin for dressing supplies : 01/15/2019 Notes: Electronic Signature(s) Signed: 02/28/2019 11:50:22 AM By: Gretta Cool, BSN, RN, CWS, Kim RN, BSN Bonnes, Gardners ([id number]) Entered By: Gretta Cool, BSN, RN, CWS, Kim on 02/05/2019 14:43:57 [name]  ([id number]) -------------------------------------------------------------------------------- Pain Assessment Details Patient Name: QUINCEY, EILERTSON. Date of Service: 02/05/2019 1:45 PM Medical Record Number: [id number] Patient Account Number: [id number] Date of Birth/Sex: 1940-08-07 (79 y.o. M) Treating RN: Army Melia Primary Care Maeryn Mcgath: Lucia Gaskins Other Clinician: Referring Mylan Lengyel: Lucia Gaskins Treating Symir Mah/Extender: Tito Dine in Treatment: 3 Active Problems Location of Pain Severity and Description of Pain Patient Has Paino Yes Site Locations Pain Location: Pain in Ulcers Rate the pain. Current Pain Level: 7 Pain Management and Medication Current Pain Management: Electronic Signature(s) Signed: 02/05/2019 2:35:34 PM By: Army Melia Entered By: Army Melia on 02/05/2019 14:08:53 [name] ([id number]) -------------------------------------------------------------------------------- Patient/Caregiver Education Details Patient Name: AURYN, PACEK. Date of Service: 02/05/2019 1:45 PM Medical Record Number: [id number] Patient Account Number: [id number] Date of Birth/Gender: 1940/04/01 (79 y.o. M) Treating RN: Cornell Barman Primary Care Physician: Lucia Gaskins Other Clinician: Referring Physician: Lucia Gaskins Treating Physician/Extender: Tito Dine in Treatment: 3 Education Assessment Education Provided To: Patient Education Topics Provided Wound Debridement: Handouts: Wound Debridement Methods: Demonstration, Explain/Verbal Responses: State content correctly Wound/Skin Impairment: Handouts: Caring for Your Ulcer Methods: Demonstration, Explain/Verbal Responses: State content correctly Electronic Signature(s) Signed: 02/28/2019 11:50:22 AM By: Gretta Cool, BSN, RN, CWS, Kim RN, BSN Entered By: Gretta Cool, BSN, RN, CWS, Kim on 02/05/2019 14:48:13 [name]  ([id number]) -------------------------------------------------------------------------------- Wound Assessment Details Patient Name: JARRID, LORANCE. Date of Service: 02/05/2019 1:45 PM Medical Record Number: [id number] Patient Account Number: [id number] Date of Birth/Sex: 1940/10/18 (79 y.o. M) Treating RN: Army Melia Primary Care Kooper Godshall: Lucia Gaskins Other Clinician: Referring Karolee Meloni: Lucia Gaskins Treating Tequisha Maahs/Extender: Tito Dine in Treatment: 3 Wound Status Wound Number: 1 Primary Diabetic Wound/Ulcer of the Lower Extremity Etiology: Wound Location: Left Toe Great - Dorsal Wound Status: Open Wounding Event: Gradually Appeared Comorbid Congestive Heart Failure, Hypertension, Date Acquired: 07/10/2018 History: Type II Diabetes Weeks Of Treatment: 3 Clustered Wound: No Photos Wound Measurements Length: (cm) 0.9 % Reductio Width: (cm) 0.5 % Reductio Depth: (cm) 0.2 Epithelial Area: (cm) 0.353 Tunneling Volume: (cm) 0.071 Undermini n in Area: 46.5% n in Volume: -7.6% ization: None : No ng: No Wound Description Classification: Grade 2 Foul Odor Wound Margin: Flat and Intact Slough/Fib Exudate Amount: Medium Exudate Type: Serous Exudate Color: amber After Cleansing: No rino Yes Wound Bed Granulation Amount: Small (1-33%) Exposed Structure Necrotic Amount: Large (67-100%) Fascia Exposed: No Necrotic Quality: Eschar, Adherent Slough Fat Layer (Subcutaneous Tissue) Exposed: Yes Tendon Exposed: No Muscle Exposed: No Joint Exposed: No Bone Exposed: No Electronic Signature(s) LELYN, OKONEK ([id number]) Signed: 02/05/2019 2:35:34 PM By: Army Melia Entered By: Army Melia on 02/05/2019 14:14:09 [name] ([id number]) -------------------------------------------------------------------------------- Wound Assessment Details Patient Name: ADIYAN, STOGDILL. Date of Service: 02/05/2019 1:45 PM Medical Record Number: [id number] Patient  Account Number: [id number] Date of Birth/Sex: [birth year] (79 y.o. M) Treating RN: Army Melia Primary Care Rachna Schonberger: Lucia Gaskins Other Clinician: Referring Macio Kissoon: Lucia Gaskins Treating Paydon Carll/Extender: Tito Dine in Treatment: 3 Wound Status Wound Number: 2 Primary Diabetic Wound/Ulcer of the  Lower Extremity Etiology: Wound Location: Left Foot - Lateral Wound Status: Open Wounding Event: Gradually Appeared Comorbid Congestive Heart Failure, Hypertension, Date Acquired: 08/10/2018 History: Type II Diabetes Weeks Of Treatment: 3 Clustered Wound: No Photos Wound Measurements Length: (cm) 0.5 % Reduction i Width: (cm) 1 % Reduction i Depth: (cm) 0.2 Epithelializa Area: (cm) 0.393 Volume: (cm) 0.079 n Area: 38.2% n Volume: 37.8% tion: None Wound Description Classification: Grade 2 Foul Odor Aft Wound Margin: Flat and Intact Slough/Fibrin Exudate Amount: Medium Exudate Type: Serous Exudate Color: amber er Cleansing: No o Yes Wound Bed Granulation Amount: Medium (34-66%) Exposed Structure Granulation Quality: Pale Fascia Exposed: No Necrotic Amount: Medium (34-66%) Fat Layer (Subcutaneous Tissue) Exposed: Yes Necrotic Quality: Adherent Slough Tendon Exposed: No Muscle Exposed: No Joint Exposed: No Bone Exposed: No Electronic Signature(s) LEXANDER, ZIRK ([id number]) Signed: 02/05/2019 2:35:34 PM By: Army Melia Entered By: Army Melia on 02/05/2019 14:15:25 [name] ([id number]) -------------------------------------------------------------------------------- Vitals Details Patient Name: DARVON, UELAND. Date of Service: 02/05/2019 1:45 PM Medical Record Number: [id number] Patient Account Number: [id number] Date of Birth/Sex: 04/04/1940 (79 y.o. M) Treating RN: Army Melia Primary Care Mayah Urquidi: Lucia Gaskins Other Clinician: Referring Daley Gosse: Lucia Gaskins Treating Sharlee Rufino/Extender: Tito Dine in Treatment:  3 Vital Signs Time Taken: 14:08 Temperature (F): 99.1 Height (in): 78 Pulse (bpm): 100 Weight (lbs): 206 Respiratory Rate (breaths/min): 16 Body Mass Index (BMI): 23.8 Blood Pressure (mmHg): 97/59 Reference Range: 80 - 120 mg / dl Electronic Signature(s) Signed: 02/05/2019 2:35:34 PM By: Army Melia Entered By: Army Melia on 02/05/2019 14:10:57

## 2019-02-05 NOTE — Progress Notes (Signed)
Dylan Robles, Dylan Robles ([id number]) Visit Report for 01/29/2019 Debridement Details Patient Name: Dylan Robles, Dylan Robles. Date of Service: 01/29/2019 12:30 PM Medical Record Number: [id number] Patient Account Number: [id number] Date of Birth/Sex: [birth year] (79 y.o. M) Treating RN: Dylan Robles Primary Care Provider: Lucia Robles Other Clinician: Referring Provider: Lucia Robles Treating Provider/Extender: Dylan Robles in Treatment: 2 Debridement Performed for Wound #1 Left,Dorsal Toe Great Assessment: Performed By: Physician Dylan Dillon, MD Debridement Type: Debridement Severity of Tissue Pre Fat layer exposed Debridement: Level of Consciousness (Pre- Awake and Alert procedure): Pre-procedure Verification/Time Yes - 12:51 Out Taken: Start Time: 12:51 Pain Control: Lidocaine Total Area Debrided (L x W): 1 (cm) x 1 (cm) = 1 (cm) Tissue and other material Viable, Non-Viable, Bone, Subcutaneous, Other: nail matrix debrided: Level: Skin/Subcutaneous Tissue/Muscle/Bone Debridement Description: Excisional Instrument: Curette Specimen: Tissue Culture Number of Specimens Taken: 1 Bleeding: Minimum Hemostasis Achieved: Pressure End Time: 12:54 Response to Treatment: Procedure was tolerated well Level of Consciousness Awake and Alert (Post-procedure): Post Debridement Measurements of Total Wound Length: (cm) 1 Width: (cm) 1.2 Depth: (cm) 0.3 Volume: (cm) 0.283 Character of Wound/Ulcer Post Debridement: Improved Severity of Tissue Post Debridement: Necrosis of bone Post Procedure Diagnosis Same as Pre-procedure Electronic Signature(s) Signed: 01/29/2019 3:29:52 PM By: Dylan Robles, BSN, RN, CWS, Kim RN, BSN Signed: 02/05/2019 4:18:13 PM By: Dylan Ham MD Entered By: Dylan Robles on 01/29/2019 13:00:46 Dylan Robles, Dylan Robles ([name], [name] ([id number]) -------------------------------------------------------------------------------- HPI Details Patient Name:  Dylan Robles, Dylan Robles. Date of Service: 01/29/2019 12:30 PM Medical Record Number: [id number] Patient Account Number: [id number] Date of Birth/Sex: Apr 26, 1940 (79 y.o. M) Treating RN: Dylan Robles Primary Care Provider: Lucia Robles Other Clinician: Referring Provider: Lucia Robles Treating Provider/Extender: Dylan Robles in Treatment: 2 History of Present Illness HPI Description: ADMISSION 01/15/2019 Patient is a pleasant 79 year old type II diabetic man who arrives accompanied by his wife from [place]. His story begins in early September 2020. He developed purulence from the corner of his left great toe. He saw his podiatrist Dylan Robles who diagnosed a severe chronic paronychia. He had total avulsion of the nail because of the paronychia today on 9/17. He was followed weekly by Dylan Robles developed a nonhealing area on the nailbed of the left great toe although his pain was improved. On 10/20/2018 he was noted to have a new wound on the left lateral foot to which she is was applying Neosporin. His dressings were changed to peroxide to the nailbed and Silvadene on 10/27. On 12/7 it was noted that he had had previous Dopplers ordered by his primary care doctor in August. These were really quite poor. ABI on the right was 0.72, 0.44 TBI. On the left 0.54 ABI with a TBI of 0.20. Monophasic waveforms bilaterally. He was referred to Dr. Delana Robles. On 12/15 he underwent an angiogram and had a stent placed in the left superficial femoral artery along with angioplasty. Since then his pain is a lot better. He is still using Silvadene to the wounds. He was prescribed doxycycline about 2 weeks ago which he finished last week. Past medical history; type 2 diabetes, BPH, hypertension, coronary artery disease, hyperlipidemia and PAD ABIs were not obtainable in the clinic today. 1/13; type II diabetic man who has wounds on the left great toenail bed on his first toe and the left  lateral foot. Both of these wounds accompanied by really significant PAD status post stenting of the left superficial femoral artery by Dr. Delana Robles on 12/15.  He is not describing claudication pain however he seems to be limiting his activity. We have been using Santyl to both wound areas. X-ray of the left foot did not show any osseous erosions or periostitis to suggest osteomyelitis. 1/20; the area on his left lateral foot improved. Surface area smaller wound bed healthy we have been using Santyl. oThe real problem is the nail bed on the left great toe. Still exposed bone although better looking granulation tissue today. Electronic Signature(s) Signed: 02/05/2019 4:18:13 PM By: Dylan Ham MD Entered By: Dylan Robles on 01/29/2019 13:01:30 Dylan Robles ([id number]) -------------------------------------------------------------------------------- Physical Exam Details Patient Name: Dylan Robles, Dylan Robles Date of Service: 01/29/2019 12:30 PM Medical Record Number: [id number] Patient Account Number: [id number] Date of Birth/Sex: 07/22/1940 (79 y.o. M) Treating RN: Dylan Robles Primary Care Provider: Lucia Robles Other Clinician: Referring Provider: Lucia Robles Treating Provider/Extender: Dylan Robles in Treatment: 2 Constitutional Patient is hypertensive.. Pulse regular and within target range for patient.[name] Respirations regular, non-labored and within target range.. Temperature is normal and within the target range for the patient.[name] appears in no distress. Notes Wound exam oNailbed of the left great toe. Better looking granulation. I was able to remove some of the nail matrix with a #3 curette and then using rongeurs was able to remove some bone for culture. Will need a prolonged course of antibiotics depending on the results here. It would be possible to get a pathology specimen as well although I did not have a small enough pair of rongeurs to do this again. oArea on  the left lateral foot looks a lot better. No debridement is required here. Electronic Signature(s) Signed: 02/05/2019 4:18:13 PM By: Dylan Ham MD Entered By: Dylan Robles on 01/29/2019 13:06:13 Dylan Robles ([id number]) -------------------------------------------------------------------------------- Physician Orders Details Patient Name: Dylan Robles, Dylan Robles. Date of Service: 01/29/2019 12:30 PM Medical Record Number: [id number] Patient Account Number: [id number] Date of Birth/Sex: [birth year] (79 y.o. M) Treating RN: Dylan Robles Primary Care Provider: Lucia Robles Other Clinician: Referring Provider: Lucia Robles Treating Provider/Extender: Dylan Robles in Treatment: 2 Verbal / Phone Orders: No Diagnosis Coding ICD-10 Coding Code Description E11.621 Type 2 diabetes mellitus with foot ulcer E11.51 Type 2 diabetes mellitus with diabetic peripheral angiopathy without gangrene L97.524 Non-pressure chronic ulcer of other part of left foot with necrosis of bone L97.528 Non-pressure chronic ulcer of other part of left foot with other specified severity E11.42 Type 2 diabetes mellitus with diabetic polyneuropathy Wound Cleansing Wound #1 Left,Dorsal Toe Great o Clean wound with Normal Saline. o May Shower, gently pat wound dry prior to applying new dressing. Wound #2 Left,Lateral Foot o Clean wound with Normal Saline. o May Shower, gently pat wound dry prior to applying new dressing. Anesthetic (add to Medication List) Wound #1 Left,Dorsal Toe Great o Topical Lidocaine 4% cream applied to wound bed prior to debridement (In Clinic Only). Wound #2 Left,Lateral Foot o Topical Lidocaine 4% cream applied to wound bed prior to debridement (In Clinic Only). Primary Wound Dressing Wound #1 Left,Dorsal Toe Great o Santyl Ointment Wound #2 Left,Lateral Foot o Santyl Ointment Secondary Dressing Wound #1 Left,Dorsal Toe Great o Gauze and  Kerlix/Conform Wound #2 Left,Lateral Foot o Gauze and Kerlix/Conform Dressing Change Frequency Wound #1 Left,Dorsal Toe Great o Change dressing every day. LAKEN, FINCANNON ([id number]) Wound #2 Left,Lateral Foot o Change dressing every day. Follow-up Appointments Wound #1 Left,Dorsal Toe Great o Return Appointment in 1 week. Wound #2 Left,Lateral Foot o Return Appointment in 1 week.  Edema Control Wound #1 Left,Dorsal Toe Great o Elevate legs to the level of the heart and pump ankles as often as possible Wound #2 Left,Lateral Foot o Elevate legs to the level of the heart and pump ankles as often as possible Laboratory o Bacteria identified in Wound by Culture (MICRO) - Left great toe bone oooo LOINC Code: 6462-6 oooo Convenience Name: Wound culture routine Electronic Signature(s) Signed: 01/29/2019 3:29:52 PM By: Dylan Robles, BSN, RN, CWS, Kim RN, BSN Signed: 02/05/2019 4:18:13 PM By: Dylan Ham MD Entered By: Dylan Robles, BSN, RN, CWS, Dylan Robles on 01/29/2019 13:01:17 Dylan Robles, Dylan Robles ([id number]) -------------------------------------------------------------------------------- Problem List Details Patient Name: KVAUGHN, LOLLIS. Date of Service: 01/29/2019 12:30 PM Medical Record Number: [id number] Patient Account Number: [id number] Date of Birth/Sex: [birth year] (79 y.o. M) Treating RN: Dylan Robles Primary Care Provider: Lucia Robles Other Clinician: Referring Provider: Lucia Robles Treating Provider/Extender: Dylan Robles in Treatment: 2 Active Problems ICD-10 Evaluated Encounter Code Description Active Date Today Diagnosis E11.621 Type 2 diabetes mellitus with foot ulcer 01/15/2019 No Yes E11.51 Type 2 diabetes mellitus with diabetic peripheral angiopathy 01/15/2019 No Yes without gangrene L97.524 Non-pressure chronic ulcer of other part of left foot with 01/15/2019 No Yes necrosis of bone L97.528 Non-pressure chronic ulcer of other part of left foot with other  01/15/2019 No Yes specified severity E11.42 Type 2 diabetes mellitus with diabetic polyneuropathy 01/15/2019 No Yes Inactive Problems Resolved Problems Electronic Signature(s) Signed: 02/05/2019 4:18:13 PM By: Dylan Ham MD Entered By: Dylan Robles on 01/29/2019 13:00:01 Dylan Robles ([id number]) -------------------------------------------------------------------------------- Progress Note Details Patient Name: Dylan Robles, Dylan Robles. Date of Service: 01/29/2019 12:30 PM Medical Record Number: [id number] Patient Account Number: [id number] Date of Birth/Sex: 1940-08-06 (79 y.o. M) Treating RN: Dylan Robles Primary Care Provider: Lucia Robles Other Clinician: Referring Provider: Lucia Robles Treating Provider/Extender: Dylan Robles in Treatment: 2 Subjective History of Present Illness (HPI) ADMISSION 01/15/2019 Patient is a pleasant 79 year old type II diabetic man who arrives accompanied by his wife from [place]. His story begins in early September 2020. He developed purulence from the corner of his left great toe. He saw his podiatrist Dylan Robles who diagnosed a severe chronic paronychia. He had total avulsion of the nail because of the paronychia today on 9/17. He was followed weekly by Dylan Robles developed a nonhealing area on the nailbed of the left great toe although his pain was improved. On 10/20/2018 he was noted to have a new wound on the left lateral foot to which she is was applying Neosporin. His dressings were changed to peroxide to the nailbed and Silvadene on 10/27. On 12/7 it was noted that he had had previous Dopplers ordered by his primary care doctor in August. These were really quite poor. ABI on the right was 0.72, 0.44 TBI. On the left 0.54 ABI with a TBI of 0.20. Monophasic waveforms bilaterally. He was referred to Dr. Delana Robles. On 12/15 he underwent an angiogram and had a stent placed in the left superficial femoral artery along with  angioplasty. Since then his pain is a lot better. He is still using Silvadene to the wounds. He was prescribed doxycycline about 2 weeks ago which he finished last week. Past medical history; type 2 diabetes, BPH, hypertension, coronary artery disease, hyperlipidemia and PAD ABIs were not obtainable in the clinic today. 1/13; type II diabetic man who has wounds on the left great toenail bed on his first toe and the left lateral foot. Both of these wounds accompanied by  really significant PAD status post stenting of the left superficial femoral artery by Dr. Delana Robles on 12/15. He is not describing claudication pain however he seems to be limiting his activity. We have been using Santyl to both wound areas. X-ray of the left foot did not show any osseous erosions or periostitis to suggest osteomyelitis. 1/20; the area on his left lateral foot improved. Surface area smaller wound bed healthy we have been using Santyl. The real problem is the nail bed on the left great toe. Still exposed bone although better looking granulation tissue today. Objective Constitutional Patient is hypertensive.. Pulse regular and within target range for patient.[name] Respirations regular, non-labored and within target range.. Temperature is normal and within the target range for the patient.[name] appears in no distress. Vitals Time Taken: 12:40 PM, Height: 78 in, Weight: 206 lbs, BMI: 23.8, Temperature: 98.1 F, Pulse: 63 bpm, Respiratory Rate: 16 breaths/min, Blood Pressure: 144/71 mmHg. Dylan Robles, Dylan Robles ([id number]) General Notes: Wound exam Nailbed of the left great toe. Better looking granulation. I was able to remove some of the nail matrix with a #3 curette and then using rongeurs was able to remove some bone for culture. Will need a prolonged course of antibiotics depending on the results here. It would be possible to get a pathology specimen as well although I did not have a small enough pair of rongeurs to do this  again. Area on the left lateral foot looks a lot better. No debridement is required here. Integumentary (Hair, Skin) Wound #1 status is Open. Original cause of wound was Gradually Appeared. The wound is located on the [employer]. The wound measures 1cm length x 1cm width x 0.3cm depth; 0.785cm^2 area and 0.236cm^3 volume. There is Fat Layer (Subcutaneous Tissue) Exposed exposed. There is no tunneling or undermining noted. There is a medium amount of serous drainage noted. The wound margin is flat and intact. There is no granulation within the wound bed. There is a large (67-100%) amount of necrotic tissue within the wound bed including Eschar and Adherent Slough. Wound #2 status is Open. Original cause of wound was Gradually Appeared. The wound is located on the Left,Lateral Foot. The wound measures 0.6cm length x 1cm width x 0.2cm depth; 0.471cm^2 area and 0.094cm^3 volume. There is Fat Layer (Subcutaneous Tissue) Exposed exposed. There is no tunneling or undermining noted. There is a medium amount of serous drainage noted. The wound margin is flat and intact. There is medium (34-66%) pale granulation within the wound bed. There is a medium (34-66%) amount of necrotic tissue within the wound bed including Adherent Slough. Assessment Active Problems ICD-10 Type 2 diabetes mellitus with foot ulcer Type 2 diabetes mellitus with diabetic peripheral angiopathy without gangrene Non-pressure chronic ulcer of other part of left foot with necrosis of bone Non-pressure chronic ulcer of other part of left foot with other specified severity Type 2 diabetes mellitus with diabetic polyneuropathy Procedures Wound #1 Pre-procedure diagnosis of Wound #1 is a Diabetic Wound/Ulcer of the Lower Extremity located on the Left,Dorsal Toe Great .Severity of Tissue Pre Debridement is: Fat layer exposed. There was a Excisional Skin/Subcutaneous Tissue/Muscle/Bone Debridement with a total area of 1 sq cm  performed by Dylan Dillon, MD. With the following instrument(s): Curette to remove Viable and Non-Viable tissue/material. Material removed includes Bone,Subcutaneous Tissue, and Other: nail matrix after achieving pain control using Lidocaine. 1 specimen was taken by a Tissue Culture and sent to the lab per facility protocol. A time out was conducted  at 12:51, prior to the start of the procedure. A Minimum amount of bleeding was controlled with Pressure. The procedure was tolerated well. Post Debridement Measurements: 1cm length x 1.2cm width x 0.3cm depth; 0.283cm^3 volume. Character of Wound/Ulcer Post Debridement is improved. Severity of Tissue Post Debridement is: Necrosis of bone. Post procedure Diagnosis Wound #1: Same as Pre-Procedure Dylan Robles, Dylan Robles ([id number]) Plan Wound Cleansing: Wound #1 Left,Dorsal Toe Great: Clean wound with Normal Saline. May Shower, gently pat wound dry prior to applying new dressing. Wound #2 Left,Lateral Foot: Clean wound with Normal Saline. May Shower, gently pat wound dry prior to applying new dressing. Anesthetic (add to Medication List): Wound #1 Left,Dorsal Toe Great: Topical Lidocaine 4% cream applied to wound bed prior to debridement (In Clinic Only). Wound #2 Left,Lateral Foot: Topical Lidocaine 4% cream applied to wound bed prior to debridement (In Clinic Only). Primary Wound Dressing: Wound #1 Left,Dorsal Toe Great: Santyl Ointment Wound #2 Left,Lateral Foot: Santyl Ointment Secondary Dressing: Wound #1 Left,Dorsal Toe Great: Gauze and Kerlix/Conform Wound #2 Left,Lateral Foot: Gauze and Kerlix/Conform Dressing Change Frequency: Wound #1 Left,Dorsal Toe Great: Change dressing every day. Wound #2 Left,Lateral Foot: Change dressing every day. Follow-up Appointments: Wound #1 Left,Dorsal Toe Great: Return Appointment in 1 week. Wound #2 Left,Lateral Foot: Return Appointment in 1 week. Edema Control: Wound #1 Left,Dorsal Toe  Great: Elevate legs to the level of the heart and pump ankles as often as possible Wound #2 Left,Lateral Foot: Elevate legs to the level of the heart and pump ankles as often as possible Laboratory ordered were: Wound culture routine - Left great toe bone 1. No concerns with pain with debridement thanks to diabetic neuropathy. 2. Bone specimen for culture 3. Continue Santyl to both wound areas 4. Antibiotics depending on culture. This is bone, will need 6 weeks of oral antibiotics depending on results 5. I am hopeful that removal of the bone protruding through the surface will allow some degree of granulation Electronic Signature(s) Signed: 02/05/2019 4:18:13 PM By: Dylan Ham MD Entered By: Dylan Robles on 01/29/2019 13:08:22 Dylan Robles, Dylan Robles ([id number]) Dylan Robles, Dylan Robles ([id number]) -------------------------------------------------------------------------------- SuperBill Details Patient Name: Dylan Robles, Dylan Robles. Date of Service: 01/29/2019 Medical Record Number: [id number] Patient Account Number: [id number] Date of Birth/Sex: [birth year] (79 y.o. M) Treating RN: Dylan Robles Primary Care Provider: Lucia Robles Other Clinician: Referring Provider: Lucia Robles Treating Provider/Extender: Dylan Robles in Treatment: 2 Diagnosis Coding ICD-10 Codes Code Description E11.621 Type 2 diabetes mellitus with foot ulcer E11.51 Type 2 diabetes mellitus with diabetic peripheral angiopathy without gangrene L97.524 Non-pressure chronic ulcer of other part of left foot with necrosis of bone L97.528 Non-pressure chronic ulcer of other part of left foot with other specified severity E11.42 Type 2 diabetes mellitus with diabetic polyneuropathy Facility Procedures CPT4 Code: [id number] Description: [id number] - DEB BONE 20 SQ CM/< ICD-10 Diagnosis Description L97.524 Non-pressure chronic ulcer of other part of left foot with ne Modifier: crosis of bone Quantity: 1 Physician  Procedures CPT4: Description Modifier Quantity Code [id number] Debridement; bone (includes epidermis, dermis, subQ tissue, muscle and/or fascia, if 1 performed) 1st 20 sqcm or less ICD-10 Diagnosis Description L97.524 Non-pressure chronic ulcer of other part of left  foot with necrosis of bone Electronic Signature(s) Signed: 02/05/2019 4:18:13 PM By: Dylan Ham MD Entered By: Dylan Robles on 01/29/2019 13:08:36

## 2019-02-05 NOTE — Progress Notes (Signed)
Dylan, Robles ([id number]) Visit Report for 01/29/2019 Arrival Information Details Patient Name: Dylan Robles, Dylan Robles. Date of Service: 01/29/2019 12:30 PM Medical Record Number: [id number] Patient Account Number: [id number] Date of Birth/Sex: 1940/03/25 (79 y.o. M) Treating RN: Cornell Barman Primary Care Annmargaret Decaprio: Lucia Gaskins Other Clinician: Referring Elizabth Palka: Lucia Gaskins Treating Worthington Cruzan/Extender: Tito Dine in Treatment: 2 Visit Information History Since Last Visit Added or deleted any medications: No Patient Arrived: Wheel Chair Any new allergies or adverse reactions: No Arrival Time: 12:41 Had a fall or experienced change in No Accompanied By: wife activities of daily living that may affect Transfer Assistance: None risk of falls: Patient Identification Verified: Yes Signs or symptoms of abuse/neglect since last visito No Secondary Verification Process Yes Hospitalized since last visit: No Completed: Implantable device outside of the clinic excluding No Patient Has Alerts: Yes cellular tissue based products placed in the center Patient Alerts: ABI L.54 R.72 since last visit: 08/2018 Has Dressing in Place as Prescribed: Yes Pain Present Now: No Electronic Signature(s) Signed: 01/29/2019 4:32:44 PM By: Lorine Bears RCP, RRT, CHT Entered By: Lorine Bears on 01/29/2019 12:42:02 [name] ([id number]) -------------------------------------------------------------------------------- Encounter Discharge Information Details Patient Name: Dylan, Robles. Date of Service: 01/29/2019 12:30 PM Medical Record Number: [id number] Patient Account Number: [id number] Date of Birth/Sex: Jul 17, 1940 (79 y.o. M) Treating RN: Cornell Barman Primary Care Kassy Mcenroe: Lucia Gaskins Other Clinician: Referring Sherma Vanmetre: Lucia Gaskins Treating Alija Riano/Extender: Tito Dine in Treatment: 2 Encounter Discharge Information Items Post  Procedure Vitals Discharge Condition: Stable Temperature (F): 98.1 Ambulatory Status: Ambulatory Pulse (bpm): 63 Discharge Destination: Home Respiratory Rate (breaths/min): 16 Transportation: Private Auto Blood Pressure (mmHg): 144/71 Accompanied By: wife Schedule Follow-up Appointment: Yes Clinical Summary of Care: Electronic Signature(s) Signed: 01/29/2019 3:29:52 PM By: Gretta Cool, BSN, RN, CWS, Kim RN, BSN Entered By: Gretta Cool, BSN, RN, CWS, Kim on 01/29/2019 13:02:31 [name] ([id number]) -------------------------------------------------------------------------------- Lower Extremity Assessment Details Patient Name: Dylan, Robles. Date of Service: 01/29/2019 12:30 PM Medical Record Number: [id number] Patient Account Number: [id number] Date of Birth/Sex: [birth year] (79 y.o. M) Treating RN: Army Melia Primary Care Buffie Herne: Lucia Gaskins Other Clinician: Referring Gurneet Matarese: Lucia Gaskins Treating Shyah Cadmus/Extender: Ricard Dillon Weeks in Treatment: 2 Edema Assessment Assessed: [Left: No] [Right: No] Edema: [Left: N] [Right: o] Vascular Assessment Pulses: Dorsalis Pedis Palpable: [Left:Yes] Electronic Signature(s) Signed: 01/29/2019 1:42:42 PM By: Army Melia Entered By: Army Melia on 01/29/2019 12:48:17 [name] ([id number]) -------------------------------------------------------------------------------- Multi Wound Chart Details Patient Name: Dylan, Robles. Date of Service: 01/29/2019 12:30 PM Medical Record Number: [id number] Patient Account Number: [id number] Date of Birth/Sex: [birth year] (79 y.o. M) Treating RN: Cornell Barman Primary Care Cortney Beissel: Lucia Gaskins Other Clinician: Referring Lindsy Cerullo: Lucia Gaskins Treating Orly Quimby/Extender: Tito Dine in Treatment: 2 Vital Signs Height(in): 78 Pulse(bpm): 77 Weight(lbs): 206 Blood Pressure(mmHg): 144/71 Body Mass Index(BMI): 24 Temperature(F): 98.1 Respiratory  Rate 16 (breaths/min): Photos: [N/A:N/A] Wound Location: Left Toe Great - Dorsal Left Foot - Lateral N/A Wounding Event: Gradually Appeared Gradually Appeared N/A Primary Etiology: Diabetic Wound/Ulcer of the Diabetic Wound/Ulcer of the N/A Lower Extremity Lower Extremity Comorbid History: Congestive Heart Failure, Congestive Heart Failure, N/A Hypertension, Type II Diabetes Hypertension, Type II Diabetes Date Acquired: 07/10/2018 08/10/2018 N/A Weeks of Treatment: 2 2 N/A Wound Status: Open Open N/A Measurements L x W x D 1x1x0.3 0.6x1x0.2 N/A (cm) Area (cm) : 0.785 0.471 N/A Volume (cm) : 0.236 0.094 N/A % Reduction in Area: -18.90% 25.90% N/A % Reduction in Volume: -  257.60% 26.00% N/A Classification: Grade 2 Grade 2 N/A Exudate Amount: Medium Medium N/A Exudate Type: Serous Serous N/A Exudate Color: amber amber N/A Wound Margin: Flat and Intact Flat and Intact N/A Granulation Amount: None Present (0%) Medium (34-66%) N/A Granulation Quality: N/A Pale N/A Necrotic Amount: Large (67-100%) Medium (34-66%) N/A Necrotic Tissue: Eschar, Adherent [place] N/A Exposed Structures: Fat Layer (Subcutaneous Fat Layer (Subcutaneous N/A Tissue) Exposed: Yes Tissue) Exposed: Yes Fascia: No Fascia: No Tendon: No Tendon: No Muscle: No Muscle: No [name], [name] ([id number]) Joint: No Joint: No Bone: No Bone: No Epithelialization: None None N/A Debridement: Debridement - Excisional N/A N/A Pre-procedure 12:51 N/A N/A Verification/Time Out Taken: Pain Control: Lidocaine N/A N/A Tissue Debrided: Other, Subcutaneous N/A N/A Level: Skin/Subcutaneous Tissue N/A N/A Debridement Area (sq cm): 1 N/A N/A Instrument: Curette N/A N/A Bleeding: Minimum N/A N/A Hemostasis Achieved: Pressure N/A N/A Debridement Treatment Procedure was tolerated well N/A N/A Response: Post Debridement 1x1.2x0.3 N/A N/A Measurements L x W x D (cm) Post Debridement Volume: 0.283 N/A  N/A (cm) Procedures Performed: Debridement N/A N/A Treatment Notes Electronic Signature(s) Signed: 02/05/2019 4:18:13 PM By: Linton Ham MD Entered By: Linton Ham on 01/29/2019 13:00:12 [name] ([id number]) -------------------------------------------------------------------------------- Multi-Disciplinary Care Plan Details Patient Name: ANTON, BICKNESE. Date of Service: 01/29/2019 12:30 PM Medical Record Number: [id number] Patient Account Number: [id number] Date of Birth/Sex: Mar 29, 1940 (79 y.o. M) Treating RN: Cornell Barman Primary Care Marlean Mortell: Lucia Gaskins Other Clinician: Referring Collyn Ribas: Lucia Gaskins Treating Aleeah Greeno/Extender: Tito Dine in Treatment: 2 Active Inactive Pain, Acute or Chronic Nursing Diagnoses: Potential alteration in comfort, pain Goals: Patient will verbalize adequate pain control and receive pain control interventions during procedures as needed Date Initiated: 01/15/2019 Target Resolution Date: 02/12/2019 Goal Status: Active Patient/caregiver will verbalize adequate pain control between visits Date Initiated: 01/15/2019 Target Resolution Date: 02/12/2019 Goal Status: Active Interventions: Provide education on pain management Reposition patient for comfort Treatment Activities: Administer pain control measures as ordered : 01/15/2019 Notes: Peripheral Neuropathy Nursing Diagnoses: Knowledge deficit related to disease process and management of peripheral neurovascular dysfunction Goals: Patient/caregiver will verbalize understanding of disease process and disease management Date Initiated: 01/15/2019 Target Resolution Date: 02/12/2019 Goal Status: Active Interventions: Assess signs and symptoms of neuropathy upon admission and as needed Provide education on Management of Neuropathy and Related Ulcers Notes: Soft Tissue Infection Nursing Diagnoses: Impaired tissue integrity VINAY, RUSSELLO ([id number]) Goals: Patient  will remain free of wound infection Date Initiated: 01/15/2019 Target Resolution Date: 01/15/2019 Goal Status: Active Interventions: Assess signs and symptoms of infection every visit Notes: Tissue Oxygenation Nursing Diagnoses: Actual ineffective tissue perfusion; peripheral (select once diagnosis is confirmed) Goals: Invasive arterial studies completed as ordered Date Initiated: 01/15/2019 Target Resolution Date: 02/05/2019 Goal Status: Active Non-invasive arterial studies are completed as ordered Date Initiated: 01/15/2019 Target Resolution Date: 02/05/2019 Goal Status: Active Interventions: Assess patient understanding of disease process and management upon diagnosis and as needed Assess peripheral arterial status upon admission and as needed Notes: Wound/Skin Impairment Nursing Diagnoses: Impaired tissue integrity Goals: Patient/caregiver will verbalize understanding of skin care regimen Date Initiated: 01/15/2019 Target Resolution Date: 02/05/2019 Goal Status: Active Ulcer/skin breakdown will have a volume reduction of 30% by week 4 Date Initiated: 01/15/2019 Target Resolution Date: 02/12/2019 Goal Status: Active Interventions: Assess ulceration(s) every visit Treatment Activities: Referred to DME Gerritt Galentine for dressing supplies : 01/15/2019 Notes: Electronic Signature(s) Signed: 01/29/2019 3:29:52 PM By: Gretta Cool, BSN, RN, CWS, Kim RN, BSN Nieto, Pyatt E. ([id number]) Entered By:  Gretta Cool, BSN, RN, CWS, Kim on 01/29/2019 12:51:09 SAVAN, CARREAU ([id number]) -------------------------------------------------------------------------------- Pain Assessment Details Patient Name: ADYNN, TENSLEY. Date of Service: 01/29/2019 12:30 PM Medical Record Number: [id number] Patient Account Number: [id number] Date of Birth/Sex: [birth year] (79 y.o. M) Treating RN: Cornell Barman Primary Care [place] Frieden: Lucia Gaskins Other Clinician: Referring Wilburta Milbourn: Lucia Gaskins Treating Jaimy Kliethermes/Extender: Tito Dine in Treatment: 2 Active Problems Location of Pain Severity and Description of Pain Patient Has Paino No Site Locations Pain Management and Medication Current Pain Management: Electronic Signature(s) Signed: 01/29/2019 3:29:52 PM By: Gretta Cool, BSN, RN, CWS, Kim RN, BSN Signed: 01/29/2019 4:32:44 PM By: Lorine Bears RCP, RRT, CHT Entered By: Lorine Bears on 01/29/2019 12:42:10 [name] ([id number]) -------------------------------------------------------------------------------- Patient/Caregiver Education Details Patient Name: AUNDREA, DEMMONS. Date of Service: 01/29/2019 12:30 PM Medical Record Number: [id number] Patient Account Number: [id number] Date of Birth/Gender: [birth year] (79 y.o. M) Treating RN: Cornell Barman Primary Care Physician: Lucia Gaskins Other Clinician: Referring Physician: Lucia Gaskins Treating Physician/Extender: Tito Dine in Treatment: 2 Education Assessment Education Provided To: Patient Education Topics Provided Wound Debridement: Handouts: Wound Debridement Methods: Demonstration, Explain/Verbal Responses: State content correctly Electronic Signature(s) Signed: 01/29/2019 3:29:52 PM By: Gretta Cool, BSN, RN, CWS, Kim RN, BSN Entered By: Gretta Cool, BSN, RN, CWS, Kim on 01/29/2019 13:01:43 [name] ([id number]) -------------------------------------------------------------------------------- Wound Assessment Details Patient Name: GEROLD, WEIGMAN. Date of Service: 01/29/2019 12:30 PM Medical Record Number: [id number] Patient Account Number: [id number] Date of Birth/Sex: May 31, 1940 (79 y.o. M) Treating RN: Army Melia Primary Care Chaylee Ehrsam: Lucia Gaskins Other Clinician: Referring Agron Swiney: Lucia Gaskins Treating Gian Ybarra/Extender: Tito Dine in Treatment: 2 Wound Status Wound Number: 1 Primary Diabetic Wound/Ulcer of the Lower Extremity Etiology: Wound Location: Left Toe  Great - Dorsal Wound Status: Open Wounding Event: Gradually Appeared Comorbid Congestive Heart Failure, Hypertension, Date Acquired: 07/10/2018 History: Type II Diabetes Weeks Of Treatment: 2 Clustered Wound: No Photos Wound Measurements Length: (cm) 1 Width: (cm) 1 Depth: (cm) 0.3 Area: (cm) 0.785 Volume: (cm) 0.236 % Reduction in Area: -18.9% % Reduction in Volume: -257.6% Epithelialization: None Tunneling: No Undermining: No Wound Description Classification: Grade 2 Foul Odor Wound Margin: Flat and Intact Slough/Fib Exudate Amount: Medium Exudate Type: Serous Exudate Color: amber After Cleansing: No rino Yes Wound Bed Granulation Amount: None Present (0%) Exposed Structure Necrotic Amount: Large (67-100%) Fascia Exposed: No Necrotic Quality: Eschar, Adherent Slough Fat Layer (Subcutaneous Tissue) Exposed: Yes Tendon Exposed: No Muscle Exposed: No Joint Exposed: No Bone Exposed: No Electronic Signature(s) JAUN, HINDMARSH ([id number]) Signed: 01/29/2019 1:42:42 PM By: Army Melia Entered By: Army Melia on 01/29/2019 12:46:09 [name] ([id number]) -------------------------------------------------------------------------------- Wound Assessment Details Patient Name: ADITYA, SZALKOWSKI. Date of Service: 01/29/2019 12:30 PM Medical Record Number: [id number] Patient Account Number: [id number] Date of Birth/Sex: 1940/12/26 (79 y.o. M) Treating RN: Army Melia Primary Care Sparrow Sanzo: Lucia Gaskins Other Clinician: Referring Isaiah Cianci: Lucia Gaskins Treating Catera Hankins/Extender: Tito Dine in Treatment: 2 Wound Status Wound Number: 2 Primary Diabetic Wound/Ulcer of the Lower Extremity Etiology: Wound Location: Left Foot - Lateral Wound Status: Open Wounding Event: Gradually Appeared Comorbid Congestive Heart Failure, Hypertension, Date Acquired: 08/10/2018 History: Type II Diabetes Weeks Of Treatment: 2 Clustered Wound: No Photos Wound  Measurements Length: (cm) 0.6 % Reduction i Width: (cm) 1 % Reduction i Depth: (cm) 0.2 Epithelializa Area: (cm) 0.471 Tunneling: Volume: (cm) 0.094 Undermining: n Area: 25.9% n Volume: 26% tion: None No No Wound Description Classification: Grade 2 Foul Odor Aft  Wound Margin: Flat and Intact Slough/Fibrin Exudate Amount: Medium Exudate Type: Serous Exudate Color: amber er Cleansing: No o Yes Wound Bed Granulation Amount: Medium (34-66%) Exposed Structure Granulation Quality: Pale Fascia Exposed: No Necrotic Amount: Medium (34-66%) Fat Layer (Subcutaneous Tissue) Exposed: Yes Necrotic Quality: Adherent Slough Tendon Exposed: No Muscle Exposed: No Joint Exposed: No Bone Exposed: No Electronic Signature(s) PAVEL, PEPITONE ([id number]) Signed: 01/29/2019 1:42:42 PM By: Army Melia Entered By: Army Melia on 01/29/2019 12:47:33 [name] ([id number]) -------------------------------------------------------------------------------- Vitals Details Patient Name: ANDRICK, GURKIN. Date of Service: 01/29/2019 12:30 PM Medical Record Number: [id number] Patient Account Number: [id number] Date of Birth/Sex: 1940/11/26 (79 y.o. M) Treating RN: Cornell Barman Primary Care Laron Boorman: Lucia Gaskins Other Clinician: Referring Tarris Delbene: Lucia Gaskins Treating Jhalil Silvera/Extender: Tito Dine in Treatment: 2 Vital Signs Time Taken: 12:40 Temperature (F): 98.1 Height (in): 78 Pulse (bpm): 63 Weight (lbs): 206 Respiratory Rate (breaths/min): 16 Body Mass Index (BMI): 23.8 Blood Pressure (mmHg): 144/71 Reference Range: 80 - 120 mg / dl Electronic Signature(s) Signed: 01/29/2019 4:32:44 PM By: Lorine Bears RCP, RRT, CHT Entered By: Lorine Bears on 01/29/2019 12:42:31

## 2019-02-07 LAB — BASIC METABOLIC PANEL
BUN: 15 (ref 4–21)
CO2: 23 — AB (ref 13–22)
Chloride: 105 (ref 99–108)
Creatinine: 1.3 (ref 0.6–1.3)
Glucose: 164
Potassium: 4.8 (ref 3.4–5.3)
Sodium: 143 (ref 137–147)

## 2019-02-07 LAB — HEMOGLOBIN A1C: Hemoglobin A1C: 10.7

## 2019-02-07 LAB — LIPID PANEL
Cholesterol: 130 (ref 0–200)
HDL: 34 — AB (ref 35–70)
LDL Cholesterol: 80
Triglycerides: 84 (ref 40–160)

## 2019-02-07 LAB — COMPREHENSIVE METABOLIC PANEL
Albumin: 3.9 (ref 3.5–5.0)
Calcium: 10 (ref 8.7–10.7)
Globulin: 2.5

## 2019-02-12 ENCOUNTER — Other Ambulatory Visit: Payer: Self-pay

## 2019-02-12 ENCOUNTER — Encounter: Payer: Medicare HMO | Attending: Internal Medicine | Admitting: Internal Medicine

## 2019-02-12 DIAGNOSIS — L97528 Non-pressure chronic ulcer of other part of left foot with other specified severity: Secondary | ICD-10-CM | POA: Insufficient documentation

## 2019-02-12 DIAGNOSIS — N400 Enlarged prostate without lower urinary tract symptoms: Secondary | ICD-10-CM | POA: Insufficient documentation

## 2019-02-12 DIAGNOSIS — M869 Osteomyelitis, unspecified: Secondary | ICD-10-CM | POA: Diagnosis not present

## 2019-02-12 DIAGNOSIS — L97524 Non-pressure chronic ulcer of other part of left foot with necrosis of bone: Secondary | ICD-10-CM | POA: Diagnosis not present

## 2019-02-12 DIAGNOSIS — E1151 Type 2 diabetes mellitus with diabetic peripheral angiopathy without gangrene: Secondary | ICD-10-CM | POA: Diagnosis not present

## 2019-02-12 DIAGNOSIS — E11621 Type 2 diabetes mellitus with foot ulcer: Secondary | ICD-10-CM | POA: Diagnosis present

## 2019-02-12 DIAGNOSIS — E1169 Type 2 diabetes mellitus with other specified complication: Secondary | ICD-10-CM | POA: Insufficient documentation

## 2019-02-12 DIAGNOSIS — E785 Hyperlipidemia, unspecified: Secondary | ICD-10-CM | POA: Diagnosis not present

## 2019-02-12 DIAGNOSIS — Z881 Allergy status to other antibiotic agents status: Secondary | ICD-10-CM | POA: Insufficient documentation

## 2019-02-12 DIAGNOSIS — I509 Heart failure, unspecified: Secondary | ICD-10-CM | POA: Diagnosis not present

## 2019-02-12 DIAGNOSIS — E1142 Type 2 diabetes mellitus with diabetic polyneuropathy: Secondary | ICD-10-CM | POA: Insufficient documentation

## 2019-02-12 DIAGNOSIS — I2510 Atherosclerotic heart disease of native coronary artery without angina pectoris: Secondary | ICD-10-CM | POA: Diagnosis not present

## 2019-02-12 DIAGNOSIS — I110 Hypertensive heart disease with heart failure: Secondary | ICD-10-CM | POA: Diagnosis not present

## 2019-02-14 NOTE — Progress Notes (Signed)
Dylan, Robles ([id number]) Visit Report for 02/12/2019 Arrival Information Details Patient Name: Dylan Robles, Dylan Robles. Date of Service: 02/12/2019 10:45 AM Medical Record Number: [id number] Patient Account Number: [id number] Date of Birth/Sex: [birth year] (79 y.o. M) Treating RN: Cornell Barman Primary Care Porschea Borys: Lucia Gaskins Other Clinician: Referring Adanely Reynoso: Lucia Gaskins Treating Faiza Bansal/Extender: Tito Dine in Treatment: 4 Visit Information History Since Last Visit Added or deleted any medications: No Patient Arrived: Ambulatory Any new allergies or adverse reactions: No Arrival Time: 10:44 Had a fall or experienced change in No Accompanied By: wife activities of daily living that may affect Transfer Assistance: None risk of falls: Patient Identification Verified: Yes Signs or symptoms of abuse/neglect since last visito No Secondary Verification Process Yes Hospitalized since last visit: No Completed: Implantable device outside of the clinic excluding No Patient Has Alerts: Yes cellular tissue based products placed in the center Patient Alerts: ABI L.54 R.72 since last visit: 08/2018 Has Dressing in Place as Prescribed: Yes Pain Present Now: No Electronic Signature(s) Signed: 02/13/2019 5:06:37 PM By: Gretta Cool, BSN, RN, CWS, Kim RN, BSN Previous Signature: 02/12/2019 2:03:05 PM Version By: Lorine Bears RCP, RRT, CHT Entered By: Gretta Cool, BSN, RN, CWS, Kim on 02/13/2019 17:06:37 [name] ([id number]) -------------------------------------------------------------------------------- Clinic Level of Care Assessment Details Patient Name: [name], Robles. Date of Service: 02/12/2019 10:45 AM Medical Record Number: [id number] Patient Account Number: [id number] Date of Birth/Sex: 09-21-1940 (79 y.o. M) Treating RN: Cornell Barman Primary Care Palmina Clodfelter: Lucia Gaskins Other Clinician: Referring Gisela Lea: Lucia Gaskins Treating Shameek Nyquist/Extender: Tito Dine in Treatment: 4 Clinic Level of Care Assessment Items TOOL 4 Quantity Score []  - Use when only an EandM is performed on FOLLOW-UP visit 0 ASSESSMENTS - Nursing Assessment / Reassessment []  - Reassessment of Co-morbidities (includes updates in patient status) 0 X- 1 5 Reassessment of Adherence to Treatment Plan ASSESSMENTS - Wound and Skin Assessment / Reassessment X - Simple Wound Assessment / Reassessment - one wound 1 5 []  - 0 Complex Wound Assessment / Reassessment - multiple wounds []  - 0 Dermatologic / Skin Assessment (not related to wound area) ASSESSMENTS - Focused Assessment []  - Circumferential Edema Measurements - multi extremities 0 []  - 0 Nutritional Assessment / Counseling / Intervention []  - 0 Lower Extremity Assessment (monofilament, tuning fork, pulses) []  - 0 Peripheral Arterial Disease Assessment (using hand held doppler) ASSESSMENTS - Ostomy and/or Continence Assessment and Care []  - Incontinence Assessment and Management 0 []  - 0 Ostomy Care Assessment and Management (repouching, etc.) PROCESS - Coordination of Care X - Simple Patient / Family Education for ongoing care 1 15 []  - 0 Complex (extensive) Patient / Family Education for ongoing care []  - 0 Staff obtains Programmer, systems, Records, Test Results / Process Orders []  - 0 Staff telephones HHA, Nursing Homes / Clarify orders / etc []  - 0 Routine Transfer to another Facility (non-emergent condition) []  - 0 Routine Hospital Admission (non-emergent condition) []  - 0 New Admissions / Biomedical engineer / Ordering NPWT, Apligraf, etc. []  - 0 Emergency Hospital Admission (emergent condition) X- 1 10 Simple Discharge Coordination ALEKSA, CREASEY ([id number]) []  - 0 Complex (extensive) Discharge Coordination PROCESS - Special Needs []  - Pediatric / Minor Patient Management 0 []  - 0 Isolation Patient Management []  - 0 Hearing / Language / Visual special needs []  - 0 Assessment of  Community assistance (transportation, D/C planning, etc.) []  - 0 Additional assistance / Altered mentation []  - 0 Support Surface(s) Assessment (bed, cushion, seat, etc.)  INTERVENTIONS - Wound Cleansing / Measurement X - Simple Wound Cleansing - one wound 1 5 []  - 0 Complex Wound Cleansing - multiple wounds X- 1 5 Wound Imaging (photographs - any number of wounds) []  - 0 Wound Tracing (instead of photographs) X- 1 5 Simple Wound Measurement - one wound []  - 0 Complex Wound Measurement - multiple wounds INTERVENTIONS - Wound Dressings []  - Small Wound Dressing one or multiple wounds 0 X- 1 15 Medium Wound Dressing one or multiple wounds []  - 0 Large Wound Dressing one or multiple wounds []  - 0 Application of Medications - topical []  - 0 Application of Medications - injection INTERVENTIONS - Miscellaneous []  - External ear exam 0 []  - 0 Specimen Collection (cultures, biopsies, blood, body fluids, etc.) []  - 0 Specimen(s) / Culture(s) sent or taken to Lab for analysis []  - 0 Patient Transfer (multiple staff / Civil Service fast streamer / Similar devices) []  - 0 Simple Staple / Suture removal (25 or less) []  - 0 Complex Staple / Suture removal (26 or more) []  - 0 Hypo / Hyperglycemic Management (close monitor of Blood Glucose) []  - 0 Ankle / Brachial Index (ABI) - do not check if billed separately X- 1 5 Vital Signs DAVYD, GARRIDO ([id number]) Has the patient been seen at the hospital within the last three years: Yes Total Score: 70 Level Of Care: New/Established - Level 2 Electronic Signature(s) Signed: 02/13/2019 5:22:08 PM By: Gretta Cool, BSN, RN, CWS, Kim RN, BSN Entered By: Gretta Cool, BSN, RN, CWS, Kim on 02/13/2019 17:08:06 [name] ([id number]) -------------------------------------------------------------------------------- Encounter Discharge Information Details Patient Name: Robles, Dylan. Date of Service: 02/12/2019 10:45 AM Medical Record Number: [id number] Patient Account  Number: [id number] Date of Birth/Sex: [birth year] (79 y.o. M) Treating RN: Cornell Barman Primary Care Maritssa Haughton: Lucia Gaskins Other Clinician: Referring Becca Bayne: Lucia Gaskins Treating Dorsie Burich/Extender: Tito Dine in Treatment: 4 Encounter Discharge Information Items Discharge Condition: Stable Ambulatory Status: Ambulatory Discharge Destination: Home Transportation: Private Auto Accompanied By: self Schedule Follow-up Appointment: Yes Clinical Summary of Care: Electronic Signature(s) Signed: 02/13/2019 5:09:44 PM By: Gretta Cool, BSN, RN, CWS, Kim RN, BSN Entered By: Gretta Cool, BSN, RN, CWS, Kim on 02/13/2019 17:09:44 [name] ([id number]) -------------------------------------------------------------------------------- Lower Extremity Assessment Details Patient Name: DEATON, BYNES. Date of Service: 02/12/2019 10:45 AM Medical Record Number: [id number] Patient Account Number: [id number] Date of Birth/Sex: 29-Apr-1940 (79 y.o. M) Treating RN: Army Melia Primary Care Sheetal Lyall: Lucia Gaskins Other Clinician: Referring Emelie Newsom: Lucia Gaskins Treating Ariam Mol/Extender: Tito Dine in Treatment: 4 Edema Assessment Assessed: [Left: No] [Right: No] Edema: [Left: N] [Right: o] Vascular Assessment Pulses: Dorsalis Pedis Palpable: [Left:Yes] Electronic Signature(s) Signed: 02/13/2019 5:07:04 PM By: Gretta Cool, BSN, RN, CWS, Kim RN, BSN Signed: 02/14/2019 12:53:43 PM By: Army Melia Previous Signature: 02/12/2019 11:30:27 AM Version By: Army Melia Entered By: Gretta Cool BSN, RN, CWS, Kim on 02/13/2019 17:07:03 JLON, QUERRY ([id number]) -------------------------------------------------------------------------------- Multi Wound Chart Details Patient Name: KRISHAWN, BUONOMO. Date of Service: 02/12/2019 10:45 AM Medical Record Number: [id number] Patient Account Number: [id number] Date of Birth/Sex: 22-Jul-1940 (79 y.o. M) Treating RN: Cornell Barman Primary Care Atul Delucia:  Lucia Gaskins Other Clinician: Referring Ivoree Felmlee: Lucia Gaskins Treating Chrishon Martino/Extender: Tito Dine in Treatment: 4 Vital Signs Height(in): 78 Pulse(bpm): 72 Weight(lbs): 206 Blood Pressure(mmHg): 145/73 Body Mass Index(BMI): 24 Temperature(F): 98.8 Respiratory Rate 16 (breaths/min): Photos: [N/A:N/A] Wound Location: Left Toe Great - Dorsal Left Foot - Lateral N/A Wounding Event: Gradually Appeared Gradually Appeared N/A Primary Etiology: Diabetic Wound/Ulcer of  the Diabetic Wound/Ulcer of the N/A Lower Extremity Lower Extremity Comorbid History: Congestive Heart Failure, Congestive Heart Failure, N/A Hypertension, Type II Diabetes Hypertension, Type II Diabetes Date Acquired: 07/10/2018 08/10/2018 N/A Weeks of Treatment: 4 4 N/A Wound Status: Open Open N/A Measurements L x W x D 0.1x0.1x0.1 0.4x0.6x0.2 N/A (cm) Area (cm) : 0.008 0.188 N/A Volume (cm) : 0.001 0.038 N/A % Reduction in Area: 98.80% 70.40% N/A % Reduction in Volume: 98.50% 70.10% N/A Classification: Grade 2 Grade 2 N/A Exudate Amount: Medium Medium N/A Exudate Type: Serous Serous N/A Exudate Color: amber amber N/A Wound Margin: Flat and Intact Flat and Intact N/A Granulation Amount: Small (1-33%) Medium (34-66%) N/A Granulation Quality: N/A Pale N/A Necrotic Amount: Large (67-100%) Medium (34-66%) N/A Necrotic Tissue: Eschar, Adherent Sugar Notch N/A Exposed Structures: Fat Layer (Subcutaneous Fat Layer (Subcutaneous N/A Tissue) Exposed: Yes Tissue) Exposed: Yes Fascia: No Fascia: No Tendon: No Tendon: No Muscle: No Muscle: No JAHDAI, BEYERS ([id number]) Joint: No Joint: No Bone: No Bone: No Epithelialization: None None N/A Treatment Notes Wound #1 (Left, Dorsal Toe Great) 1. Cleansed with: Cleanse wound with antibacterial soap and water 2. Anesthetic Topical Lidocaine 4% cream to wound bed prior to debridement 4. Dressing Applied: Other dressing (specify  in notes) 5. Secondary Dressing Applied ABD Pad Notes Silvercell,ABD, conform, tape Wound #2 (Left, Lateral Foot) 1. Cleansed with: Cleanse wound with antibacterial soap and water 2. Anesthetic Topical Lidocaine 4% cream to wound bed prior to debridement 4. Dressing Applied: Other dressing (specify in notes) 5. Secondary Dressing Applied ABD Pad Notes Silvercell,ABD, conform, tape Electronic Signature(s) Signed: 02/13/2019 5:07:32 PM By: Gretta Cool, BSN, RN, CWS, Kim RN, BSN Previous Signature: 02/12/2019 4:41:52 PM Version By: Linton Ham MD Entered By: Gretta Cool, BSN, RN, CWS, Kim on 02/13/2019 17:07:32 [name] ([id number]) -------------------------------------------------------------------------------- Multi-Disciplinary Care Plan Details Patient Name: JOSHUAN, GIAIMO. Date of Service: 02/12/2019 10:45 AM Medical Record Number: [id number] Patient Account Number: [id number] Date of Birth/Sex: 05-Jan-1941 (79 y.o. M) Treating RN: Cornell Barman Primary Care Shakim Faith: Lucia Gaskins Other Clinician: Referring Tayari Yankee: Lucia Gaskins Treating Tifini Reeder/Extender: Tito Dine in Treatment: 4 Active Inactive Osteomyelitis Nursing Diagnoses: Infection: osteomyelitis Goals: Diagnostic evaluation for osteomyelitis completed as ordered Date Initiated: 02/12/2019 Target Resolution Date: 02/05/2019 Goal Status: Active Patient's osteomyelitis will resolve Date Initiated: 02/12/2019 Target Resolution Date: 03/19/2019 Goal Status: Active Interventions: Assess for signs and symptoms of osteomyelitis resolution every visit Provide education on osteomyelitis Treatment Activities: Systemic antibiotics : 02/12/2019 Notes: Pain, Acute or Chronic Nursing Diagnoses: Potential alteration in comfort, pain Goals: Patient will verbalize adequate pain control and receive pain control interventions during procedures as needed Date Initiated: 01/15/2019 Target Resolution Date: 02/12/2019 Goal  Status: Active Patient/caregiver will verbalize adequate pain control between visits Date Initiated: 01/15/2019 Target Resolution Date: 02/12/2019 Goal Status: Active Interventions: Provide education on pain management Reposition patient for comfort Treatment Activities: Administer pain control measures as ordered : 01/15/2019 [name] ([id number]) Notes: Peripheral Neuropathy Nursing Diagnoses: Knowledge deficit related to disease process and management of peripheral neurovascular dysfunction Goals: Patient/caregiver will verbalize understanding of disease process and disease management Date Initiated: 01/15/2019 Target Resolution Date: 02/12/2019 Goal Status: Active Interventions: Assess signs and symptoms of neuropathy upon admission and as needed Provide education on Management of Neuropathy and Related Ulcers Notes: Soft Tissue Infection Nursing Diagnoses: Impaired tissue integrity Goals: Patient will remain free of wound infection Date Initiated: 01/15/2019 Target Resolution Date: 01/15/2019 Goal Status: Active Interventions: Assess signs and symptoms of infection  every visit Notes: Tissue Oxygenation Nursing Diagnoses: Actual ineffective tissue perfusion; peripheral (select once diagnosis is confirmed) Goals: Invasive arterial studies completed as ordered Date Initiated: 01/15/2019 Target Resolution Date: 02/05/2019 Goal Status: Active Non-invasive arterial studies are completed as ordered Date Initiated: 01/15/2019 Target Resolution Date: 02/05/2019 Goal Status: Active Interventions: Assess patient understanding of disease process and management upon diagnosis and as needed Assess peripheral arterial status upon admission and as needed Notes: Wound/Skin Impairment NASEER, VOLO ([id number]) Nursing Diagnoses: Impaired tissue integrity Goals: Patient/caregiver will verbalize understanding of skin care regimen Date Initiated: 01/15/2019 Target Resolution Date:  02/05/2019 Goal Status: Active Ulcer/skin breakdown will have a volume reduction of 30% by week 4 Date Initiated: 01/15/2019 Target Resolution Date: 02/12/2019 Goal Status: Active Interventions: Assess ulceration(s) every visit Treatment Activities: Referred to DME Zyad Boomer for dressing supplies : 01/15/2019 Notes: Electronic Signature(s) Signed: 02/13/2019 5:07:14 PM By: Gretta Cool, BSN, RN, CWS, Kim RN, BSN Entered By: Gretta Cool, BSN, RN, CWS, Kim on 02/13/2019 17:07:14 [name] ([id number]) -------------------------------------------------------------------------------- Pain Assessment Details Patient Name: LAWERANCE, GUTRIDGE. Date of Service: 02/12/2019 10:45 AM Medical Record Number: [id number] Patient Account Number: [id number] Date of Birth/Sex: [birth year] (79 y.o. M) Treating RN: Cornell Barman Primary Care Jordana Dugue: Lucia Gaskins Other Clinician: Referring Tyronne Blann: Lucia Gaskins Treating Daleah Coulson/Extender: Tito Dine in Treatment: 4 Active Problems Location of Pain Severity and Description of Pain Patient Has Paino No Site Locations Pain Management and Medication Current Pain Management: Electronic Signature(s) Signed: 02/13/2019 5:06:45 PM By: Gretta Cool, BSN, RN, CWS, Kim RN, BSN Previous Signature: 02/12/2019 2:03:05 PM Version By: Lorine Bears RCP, RRT, CHT Entered By: Gretta Cool, BSN, RN, CWS, Kim on 02/13/2019 17:06:45 [name] ([id number]) -------------------------------------------------------------------------------- Patient/Caregiver Education Details Patient Name: LAROME, SHADBOLT. Date of Service: 02/12/2019 10:45 AM Medical Record Number: [id number] Patient Account Number: [id number] Date of Birth/Gender: 10-Dec-1940 (79 y.o. M) Treating RN: Cornell Barman Primary Care Physician: Lucia Gaskins Other Clinician: Referring Physician: Lucia Gaskins Treating Physician/Extender: Tito Dine in Treatment: 4 Education Assessment Education  Provided To: Patient Education Topics Provided Wound/Skin Impairment: Handouts: Caring for Your Ulcer Methods: Demonstration, Explain/Verbal Responses: State content correctly Electronic Signature(s) Signed: 02/13/2019 5:22:08 PM By: Gretta Cool, BSN, RN, CWS, Kim RN, BSN Entered By: Gretta Cool, BSN, RN, CWS, Kim on 02/13/2019 17:08:21 [name] ([id number]) -------------------------------------------------------------------------------- Wound Assessment Details Patient Name: ABDULAZEEZ, MCPHEARSON. Date of Service: 02/12/2019 10:45 AM Medical Record Number: [id number] Patient Account Number: [id number] Date of Birth/Sex: 1940-05-25 (79 y.o. M) Treating RN: Army Melia Primary Care Kalicia Dufresne: Lucia Gaskins Other Clinician: Referring Chadric Kimberley: Lucia Gaskins Treating Jamira Barfuss/Extender: STONE III, HOYT Weeks in Treatment: 4 Wound Status Wound Number: 1 Primary Diabetic Wound/Ulcer of the Lower Extremity Etiology: Wound Location: Left Toe Great - Dorsal Wound Status: Open Wounding Event: Gradually Appeared Comorbid Congestive Heart Failure, Hypertension, Date Acquired: 07/10/2018 History: Type II Diabetes Weeks Of Treatment: 4 Clustered Wound: No Photos Wound Measurements Length: (cm) 0.1 % Reduction Width: (cm) 0.1 % Reduction Depth: (cm) 0.1 Epitheliali Area: (cm) 0.008 Volume: (cm) 0.001 in Area: 98.8% in Volume: 98.5% zation: None Wound Description Classification: Grade 2 Foul Odor A Wound Margin: Flat and Intact Slough/Fibr Exudate Amount: Medium Exudate Type: Serous Exudate Color: amber fter Cleansing: No ino Yes Wound Bed Granulation Amount: Small (1-33%) Exposed Structure Necrotic Amount: Large (67-100%) Fascia Exposed: No Necrotic Quality: Eschar, Adherent Slough Fat Layer (Subcutaneous Tissue) Exposed: Yes Tendon Exposed: No Muscle Exposed: No Joint Exposed: No Bone Exposed: No Treatment Notes OSBOURNE, HOLTZCLAW ([id number])  Wound #1 (Left, Dorsal Toe Great) 1.  Cleansed with: Cleanse wound with antibacterial soap and water 2. Anesthetic Topical Lidocaine 4% cream to wound bed prior to debridement 4. Dressing Applied: Other dressing (specify in notes) 5. Secondary Dressing Applied ABD Pad Notes Silvercell,ABD, conform, tape Electronic Signature(s) Signed: 02/12/2019 11:30:27 AM By: Army Melia Entered By: Army Melia on 02/12/2019 10:53:43 CHEVEZ, RIVEST ([id number]) -------------------------------------------------------------------------------- Wound Assessment Details Patient Name: ARSON, LUNDWALL. Date of Service: 02/12/2019 10:45 AM Medical Record Number: [id number] Patient Account Number: [id number] Date of Birth/Sex: [birth year] (79 y.o. M) Treating RN: Army Melia Primary Care Jazmine Longshore: Lucia Gaskins Other Clinician: Referring Braydon Kullman: Lucia Gaskins Treating [place] Droll/Extender: STONE III, HOYT Weeks in Treatment: 4 Wound Status Wound Number: 2 Primary Diabetic Wound/Ulcer of the Lower Extremity Etiology: Wound Location: Left Foot - Lateral Wound Status: Open Wounding Event: Gradually Appeared Comorbid Congestive Heart Failure, Hypertension, Date Acquired: 08/10/2018 History: Type II Diabetes Weeks Of Treatment: 4 Clustered Wound: No Photos Wound Measurements Length: (cm) 0.4 % Reduction in Width: (cm) 0.6 % Reduction in Depth: (cm) 0.2 Epithelializat Area: (cm) 0.188 Volume: (cm) 0.038 Area: 70.4% Volume: 70.1% ion: None Wound Description Classification: Grade 2 Foul Odor Afte Wound Margin: Flat and Intact Slough/Fibrino Exudate Amount: Medium Exudate Type: Serous Exudate Color: amber r Cleansing: No Yes Wound Bed Granulation Amount: Medium (34-66%) Exposed Structure Granulation Quality: Pale Fascia Exposed: No Necrotic Amount: Medium (34-66%) Fat Layer (Subcutaneous Tissue) Exposed: Yes Necrotic Quality: Adherent Slough Tendon Exposed: No Muscle Exposed: No Joint Exposed: No Bone Exposed:  No Treatment Notes RASHEIM, DAHMAN ([id number]) Wound #2 (Left, Lateral Foot) 1. Cleansed with: Cleanse wound with antibacterial soap and water 2. Anesthetic Topical Lidocaine 4% cream to wound bed prior to debridement 4. Dressing Applied: Other dressing (specify in notes) 5. Secondary Dressing Applied ABD Pad Notes Silvercell,ABD, conform, tape Electronic Signature(s) Signed: 02/12/2019 11:30:27 AM By: Army Melia Entered By: Army Melia on 02/12/2019 10:54:28 [name] ([id number]) -------------------------------------------------------------------------------- Vitals Details Patient Name: VANNA, ARCHAMBEAULT. Date of Service: 02/12/2019 10:45 AM Medical Record Number: [id number] Patient Account Number: [id number] Date of Birth/Sex: 04/19/1940 (79 y.o. M) Treating RN: Cornell Barman Primary Care Agnes Probert: Lucia Gaskins Other Clinician: Referring Angelamarie Avakian: Lucia Gaskins Treating Jachob Mcclean/Extender: Tito Dine in Treatment: 4 Vital Signs Time Taken: 10:45 Temperature (F): 98.8 Height (in): 78 Pulse (bpm): 72 Weight (lbs): 206 Respiratory Rate (breaths/min): 16 Body Mass Index (BMI): 23.8 Blood Pressure (mmHg): 145/73 Reference Range: 80 - 120 mg / dl Electronic Signature(s) Signed: 02/13/2019 5:06:53 PM By: Gretta Cool, BSN, RN, CWS, Kim RN, BSN Previous Signature: 02/12/2019 2:03:05 PM Version By: Lorine Bears RCP, RRT, CHT Entered By: Gretta Cool, BSN, RN, CWS, Kim on 02/13/2019 17:06:53

## 2019-02-19 ENCOUNTER — Other Ambulatory Visit: Payer: Self-pay

## 2019-02-19 ENCOUNTER — Encounter: Payer: Medicare HMO | Admitting: Internal Medicine

## 2019-02-19 DIAGNOSIS — E11621 Type 2 diabetes mellitus with foot ulcer: Secondary | ICD-10-CM | POA: Diagnosis not present

## 2019-02-20 NOTE — Progress Notes (Signed)
DESIDERIO, MOSKWA ([id number]) Visit Report for 02/19/2019 Debridement Details Patient Name: Dylan Robles, Dylan Robles. Date of Service: 02/19/2019 10:45 AM Medical Record Number: [id number] Patient Account Number: [id number] Date of Birth/Sex: Nov 01, 1940 (79 y.o. M) Treating RN: Cornell Barman Primary Care Provider: Lucia Gaskins Other Clinician: Referring Provider: Lucia Gaskins Treating Provider/Extender: Tito Dine in Treatment: 5 Debridement Performed for Wound #2 Left,Lateral Foot Assessment: Performed By: Physician Ricard Dillon, MD Debridement Type: Debridement Severity of Tissue Pre Limited to breakdown of skin Debridement: Level of Consciousness (Pre- Awake and Alert procedure): Pre-procedure Verification/Time Yes - 11:15 Out Taken: Start Time: 11:15 Pain Control: Lidocaine Total Area Debrided (L x W): 0.5 (cm) x 0.5 (cm) = 0.25 (cm) Tissue and other material Non-Viable, Callus, Skin: Dermis debrided: Level: Skin/Dermis Debridement Description: Selective/Open Wound Instrument: Curette Bleeding: None Hemostasis Achieved: Pressure End Time: 11:19 Response to Treatment: Procedure was tolerated well Level of Consciousness Awake and Alert (Post-procedure): Post Debridement Measurements of Total Wound Length: (cm) 0.2 Width: (cm) 0.3 Depth: (cm) 0.1 Volume: (cm) 0.005 Character of Wound/Ulcer Post Debridement: Requires Further Debridement Severity of Tissue Post Debridement: Limited to breakdown of skin Post Procedure Diagnosis Same as Pre-procedure Electronic Signature(s) Signed: 02/19/2019 5:43:36 PM By: Linton Ham MD Signed: 02/19/2019 5:56:57 PM By: Gretta Cool, BSN, RN, CWS, Kim RN, BSN Entered By: Linton Ham on 02/19/2019 12:01:21 [name] ([id number]) -------------------------------------------------------------------------------- HPI Details Patient Name: Dylan Robles, Dylan Robles. Date of Service: 02/19/2019 10:45 AM Medical Record Number:  [id number] Patient Account Number: [id number] Date of Birth/Sex: 17-Dec-1940 (79 y.o. M) Treating RN: Cornell Barman Primary Care Provider: Lucia Gaskins Other Clinician: Referring Provider: Lucia Gaskins Treating Provider/Extender: Tito Dine in Treatment: 5 History of Present Illness HPI Description: ADMISSION 01/15/2019 Patient is a pleasant 79 year old type II diabetic man who arrives accompanied by his wife from [place]. His story begins in early September 2020. He developed purulence from the corner of his left great toe. He saw his podiatrist Dr. Sharyon Cable who diagnosed a severe chronic paronychia. He had total avulsion of the nail because of the paronychia today on 9/17. He was followed weekly by Dr. Sharyon Cable developed a nonhealing area on the nailbed of the left great toe although his pain was improved. On 10/20/2018 he was noted to have a new wound on the left lateral foot to which she is was applying Neosporin. His dressings were changed to peroxide to the nailbed and Silvadene on 10/27. On 12/7 it was noted that he had had previous Dopplers ordered by his primary care doctor in August. These were really quite poor. ABI on the right was 0.72, 0.44 TBI. On the left 0.54 ABI with a TBI of 0.20. Monophasic waveforms bilaterally. He was referred to Dr. Delana Meyer. On 12/15 he underwent an angiogram and had a stent placed in the left superficial femoral artery along with angioplasty. Since then his pain is a lot better. He is still using Silvadene to the wounds. He was prescribed doxycycline about 2 weeks ago which he finished last week. Past medical history; type 2 diabetes, BPH, hypertension, coronary artery disease, hyperlipidemia and PAD ABIs were not obtainable in the clinic today. 1/13; type II diabetic man who has wounds on the left great toenail bed on his first toe and the left lateral foot. Both of these wounds accompanied by really significant PAD status  post stenting of the left superficial femoral artery by Dr. Delana Meyer on 12/15. He is not describing claudication pain however he seems to  be limiting his activity. We have been using Santyl to both wound areas. X-ray of the left foot did not show any osseous erosions or periostitis to suggest osteomyelitis. 1/20; the area on his left lateral foot improved. Surface area smaller wound bed healthy we have been using Santyl. oThe real problem is the nail bed on the left great toe. Still exposed bone although better looking granulation tissue today. 1/27; patient has wounds on the left lateral foot and the nailbed of the left great toe. Bone that I sent for culture from the left great toe last time came back showing abundant Enterobacter Asburiae. We have been using Santyl 2/3; patient has wounds on the left lateral foot and the nailbed of the left great toe. He has osteomyelitis in the distal phalanx of the left great toe which I previously biopsied showing Enterobacter. I have him on Levaquin he has been on this for a week he is tolerating this well without significant side effects. The wound that was there on the nailbed and tip of the left great toe has closed over. The area on the left lateral foot is smaller 2/10; the patient's left great toe remains healed covered with a mycotic nail. He is tolerating ciprofloxacin [not Levaquin] well and I will need to reorder this today 4 weeks 3 and 4 no side effects. The area on his left lateral foot is also a lot better. Electronic Signature(s) Signed: 02/19/2019 5:43:36 PM By: Linton Ham MD Entered By: Linton Ham on 02/19/2019 12:02:57 [name] ([id number]) -------------------------------------------------------------------------------- Physical Exam Details Patient Name: Dylan Robles, Dylan Robles. Date of Service: 02/19/2019 10:45 AM Medical Record Number: [id number] Patient Account Number: [id number] Date of Birth/Sex: [birth year] (80 y.o.  M) Treating RN: Cornell Barman Primary Care Provider: Lucia Gaskins Other Clinician: Referring Provider: Lucia Gaskins Treating Provider/Extender: Tito Dine in Treatment: 5 Constitutional Sitting or standing Blood Pressure is within target range for patient.. Pulse regular and within target range for patient.[name] Respirations regular, non-labored and within target range.. Temperature is normal and within the target range for the patient.[name] appears in no distress. Notes Wound exam; oThe nailbed of the left of the left great toe was closed. Mycotic nail over the top but I do not see any worrisome tissue issues here no breakdown. oThe patient's wound on the left lateral foot required debridement of eschar and skin to reveal a small superficial looking wound but with some depth no surrounding infection Electronic Signature(s) Signed: 02/19/2019 5:43:36 PM By: Linton Ham MD Entered By: Linton Ham on 02/19/2019 12:07:56 [name] ([id number]) -------------------------------------------------------------------------------- Physician Orders Details Patient Name: Dylan Robles, Dylan Robles. Date of Service: 02/19/2019 10:45 AM Medical Record Number: [id number] Patient Account Number: [id number] Date of Birth/Sex: [birth year] (79 y.o. M) Treating RN: Cornell Barman Primary Care Provider: Lucia Gaskins Other Clinician: Referring Provider: Lucia Gaskins Treating Provider/Extender: Tito Dine in Treatment: 5 Verbal / Phone Orders: No Diagnosis Coding Wound Cleansing Wound #1 Left,Dorsal Toe Great o Clean wound with Normal Saline. o May Shower, gently pat wound dry prior to applying new dressing. Wound #2 Left,Lateral Foot o Clean wound with Normal Saline. o May Shower, gently pat wound dry prior to applying new dressing. Anesthetic (add to Medication List) Wound #1 Left,Dorsal Toe Great o Topical Lidocaine 4% cream applied to wound bed prior to  debridement (In Clinic Only). Wound #2 Left,Lateral Foot o Topical Lidocaine 4% cream applied to wound bed prior to debridement (In Clinic Only). Primary Wound Dressing Wound #1  Left,Dorsal Toe Great o Other: - coverlet for protection Wound #2 Left,Lateral Foot o Silver Alginate Secondary Dressing Wound #2 Left,Lateral Foot o Gauze and Kerlix/Conform Dressing Change Frequency Wound #1 Left,Dorsal Toe Great o Other: - as needed Wound #2 Left,Lateral Foot o Change Dressing Monday, Wednesday, Friday Follow-up Appointments Wound #1 Left,Dorsal Toe Great o Return Appointment in 2 weeks. Wound #2 Left,Lateral Foot o Return Appointment in 2 weeks. Edema Control REYES, KASTER ([id number]) Wound #1 Left,Dorsal Toe Great o Elevate legs to the level of the heart and pump ankles as often as possible Wound #2 Left,Lateral Foot o Elevate legs to the level of the heart and pump ankles as often as possible Off-Loading Wound #1 Left,Dorsal Toe Great o Open toe surgical shoe Wound #2 Left,Lateral Foot o Open toe surgical shoe Medications-please add to medication list. Wound #1 Left,Dorsal Toe Great o P.O. Antibiotics - Continue long term round of Cipro Wound #2 Left,Lateral Foot o P.O. Antibiotics - Continue long term round of Cipro Patient Medications Allergies: Plavix Notifications Medication Indication Start End Cipro osteomyelitis left 02/19/2019 great toe DOSE oral 500 mg tablet - 1 tablet oral bid for 2 weeks (continuing rx) Electronic Signature(s) Signed: 02/19/2019 12:11:06 PM By: Linton Ham MD Entered By: Linton Ham on 02/19/2019 12:11:06 [name] ([id number]) -------------------------------------------------------------------------------- Problem List Details Patient Name: Dylan Robles, Dylan Robles. Date of Service: 02/19/2019 10:45 AM Medical Record Number: [id number] Patient Account Number: [id number] Date of Birth/Sex: 03/27/1940 (79 y.o.  M) Treating RN: Cornell Barman Primary Care Provider: Lucia Gaskins Other Clinician: Referring Provider: Lucia Gaskins Treating Provider/Extender: Tito Dine in Treatment: 5 Active Problems ICD-10 Evaluated Encounter Code Description Active Date Today Diagnosis E11.621 Type 2 diabetes mellitus with foot ulcer 01/15/2019 No Yes M86.672 Other chronic osteomyelitis, left ankle and foot 02/05/2019 No Yes E11.51 Type 2 diabetes mellitus with diabetic peripheral angiopathy 01/15/2019 No Yes without gangrene L97.524 Non-pressure chronic ulcer of other part of left foot with 01/15/2019 No Yes necrosis of bone L97.528 Non-pressure chronic ulcer of other part of left foot with other 01/15/2019 No Yes specified severity E11.42 Type 2 diabetes mellitus with diabetic polyneuropathy 01/15/2019 No Yes Inactive Problems Resolved Problems Electronic Signature(s) Signed: 02/19/2019 5:43:36 PM By: Linton Ham MD Entered By: Linton Ham on 02/19/2019 12:00:04 [name] ([id number]) -------------------------------------------------------------------------------- Progress Note Details Patient Name: Dylan Robles, Dylan Robles. Date of Service: 02/19/2019 10:45 AM Medical Record Number: [id number] Patient Account Number: [id number] Date of Birth/Sex: Feb 15, 1940 (79 y.o. M) Treating RN: Cornell Barman Primary Care Provider: Lucia Gaskins Other Clinician: Referring Provider: Lucia Gaskins Treating Provider/Extender: Tito Dine in Treatment: 5 Subjective History of Present Illness (HPI) ADMISSION 01/15/2019 Patient is a pleasant 79 year old type II diabetic man who arrives accompanied by his wife from [place]. His story begins in early September 2020. He developed purulence from the corner of his left great toe. He saw his podiatrist Dr. Sharyon Cable who diagnosed a severe chronic paronychia. He had total avulsion of the nail because of the paronychia today on 9/17. He  was followed weekly by Dr. Sharyon Cable developed a nonhealing area on the nailbed of the left great toe although his pain was improved. On 10/20/2018 he was noted to have a new wound on the left lateral foot to which she is was applying Neosporin. His dressings were changed to peroxide to the nailbed and Silvadene on 10/27. On 12/7 it was noted that he had had previous Dopplers ordered by his primary care doctor in August.  These were really quite poor. ABI on the right was 0.72, 0.44 TBI. On the left 0.54 ABI with a TBI of 0.20. Monophasic waveforms bilaterally. He was referred to Dr. Delana Meyer. On 12/15 he underwent an angiogram and had a stent placed in the left superficial femoral artery along with angioplasty. Since then his pain is a lot better. He is still using Silvadene to the wounds. He was prescribed doxycycline about 2 weeks ago which he finished last week. Past medical history; type 2 diabetes, BPH, hypertension, coronary artery disease, hyperlipidemia and PAD ABIs were not obtainable in the clinic today. 1/13; type II diabetic man who has wounds on the left great toenail bed on his first toe and the left lateral foot. Both of these wounds accompanied by really significant PAD status post stenting of the left superficial femoral artery by Dr. Delana Meyer on 12/15. He is not describing claudication pain however he seems to be limiting his activity. We have been using Santyl to both wound areas. X-ray of the left foot did not show any osseous erosions or periostitis to suggest osteomyelitis. 1/20; the area on his left lateral foot improved. Surface area smaller wound bed healthy we have been using Santyl. The real problem is the nail bed on the left great toe. Still exposed bone although better looking granulation tissue today. 1/27; patient has wounds on the left lateral foot and the nailbed of the left great toe. Bone that I sent for culture from the left great toe last time came back showing  abundant Enterobacter Asburiae. We have been using Santyl 2/3; patient has wounds on the left lateral foot and the nailbed of the left great toe. He has osteomyelitis in the distal phalanx of the left great toe which I previously biopsied showing Enterobacter. I have him on Levaquin he has been on this for a week he is tolerating this well without significant side effects. The wound that was there on the nailbed and tip of the left great toe has closed over. The area on the left lateral foot is smaller 2/10; the patient's left great toe remains healed covered with a mycotic nail. He is tolerating ciprofloxacin [not Levaquin] well and I will need to reorder this today 4 weeks 3 and 4 no side effects. The area on his left lateral foot is also a lot better. Dylan Robles, Dylan Robles ([id number]) Objective Constitutional Sitting or standing Blood Pressure is within target range for patient.. Pulse regular and within target range for patient.[name] Respirations regular, non-labored and within target range.. Temperature is normal and within the target range for the patient.[name] appears in no distress. Vitals Time Taken: 10:43 AM, Height: 78 in, Weight: 206 lbs, BMI: 23.8, Temperature: 98.8 F, Pulse: 90 bpm, Respiratory Rate: 16 breaths/min, Blood Pressure: 118/62 mmHg. General Notes: Wound exam; The nailbed of the left of the left great toe was closed. Mycotic nail over the top but I do not see any worrisome tissue issues here no breakdown. The patient's wound on the left lateral foot required debridement of eschar and skin to reveal a small superficial looking wound but with some depth no surrounding infection Integumentary (Hair, Skin) Wound #1 status is Open. Original cause of wound was Gradually Appeared. The wound is located on the [employer]. The wound measures 0.1cm length x 0.1cm width x 0.1cm depth; 0.008cm^2 area and 0.001cm^3 volume. There is Fat Layer (Subcutaneous Tissue) Exposed exposed.  There is a medium amount of serous drainage noted. The wound margin is  flat and intact. There is small (1-33%) granulation within the wound bed. There is a large (67-100%) amount of necrotic tissue within the wound bed including Eschar and Adherent Slough. Wound #2 status is Open. Original cause of wound was Gradually Appeared. The wound is located on the Left,Lateral Foot. The wound measures 0.2cm length x 0.3cm width x 0.1cm depth; 0.047cm^2 area and 0.005cm^3 volume. There is Fat Layer (Subcutaneous Tissue) Exposed exposed. There is a medium amount of serous drainage noted. The wound margin is flat and intact. There is medium (34-66%) pale granulation within the wound bed. There is a medium (34-66%) amount of necrotic tissue within the wound bed including Adherent Slough. Assessment Active Problems ICD-10 Type 2 diabetes mellitus with foot ulcer Other chronic osteomyelitis, left ankle and foot Type 2 diabetes mellitus with diabetic peripheral angiopathy without gangrene Non-pressure chronic ulcer of other part of left foot with necrosis of bone Non-pressure chronic ulcer of other part of left foot with other specified severity Type 2 diabetes mellitus with diabetic polyneuropathy Procedures Wound #2 Pre-procedure diagnosis of Wound #2 is a Diabetic Wound/Ulcer of the Lower Extremity located on the Left,Lateral Foot .Severity of Tissue Pre Debridement is: Limited to breakdown of skin. There was a Selective/Open Wound Skin/Dermis Debridement with a total area of 0.25 sq cm performed by Ricard Dillon, MD. With the following instrument(s): Curette to remove Non-Viable tissue/material. Material removed includes Callus and Skin: Dermis and after achieving pain control using Lidocaine. No specimens were taken. A time out was conducted at 11:15, prior to the start of the procedure. Dylan Robles, Dylan Robles ([id number]) There was no bleeding. The procedure was tolerated well. Post Debridement  Measurements: 0.2cm length x 0.3cm width x 0.1cm depth; 0.005cm^3 volume. Character of Wound/Ulcer Post Debridement requires further debridement. Severity of Tissue Post Debridement is: Limited to breakdown of skin. Post procedure Diagnosis Wound #2: Same as Pre-Procedure Plan Wound Cleansing: Wound #1 Left,Dorsal Toe Great: Clean wound with Normal Saline. May Shower, gently pat wound dry prior to applying new dressing. Wound #2 Left,Lateral Foot: Clean wound with Normal Saline. May Shower, gently pat wound dry prior to applying new dressing. Anesthetic (add to Medication List): Wound #1 Left,Dorsal Toe Great: Topical Lidocaine 4% cream applied to wound bed prior to debridement (In Clinic Only). Wound #2 Left,Lateral Foot: Topical Lidocaine 4% cream applied to wound bed prior to debridement (In Clinic Only). Primary Wound Dressing: Wound #1 Left,Dorsal Toe Great: Other: - coverlet for protection Wound #2 Left,Lateral Foot: Silver Alginate Secondary Dressing: Wound #2 Left,Lateral Foot: Gauze and Kerlix/Conform Dressing Change Frequency: Wound #1 Left,Dorsal Toe Great: Other: - as needed Wound #2 Left,Lateral Foot: Change Dressing Monday, Wednesday, Friday Follow-up Appointments: Wound #1 Left,Dorsal Toe Great: Return Appointment in 2 weeks. Wound #2 Left,Lateral Foot: Return Appointment in 2 weeks. Edema Control: Wound #1 Left,Dorsal Toe Great: Elevate legs to the level of the heart and pump ankles as often as possible Wound #2 Left,Lateral Foot: Elevate legs to the level of the heart and pump ankles as often as possible Off-Loading: Wound #1 Left,Dorsal Toe Great: Open toe surgical shoe Wound #2 Left,Lateral Foot: Open toe surgical shoe Medications-please add to medication list.: Wound #1 Left,Dorsal Toe Great: P.O. Antibiotics - Continue long term round of Cipro Wound #2 Left,Lateral Foot: P.O. Antibiotics - Continue long term round of Cipro Dylan Robles, Dylan Robles  ([id number]) The following medication(s) was prescribed: Cipro oral 500 mg tablet 1 tablet oral bid for 2 weeks (continuing rx) for osteomyelitis left great  toe starting 02/19/2019 1. He does not require any specific dressing on the left great toe but does require reordering of ciprofloxacin 500 twice daily for 2 weeks 2. Continue with silver alginate to the left lateral foot. This is also just about closed Electronic Signature(s) Signed: 02/19/2019 12:11:29 PM By: Linton Ham MD Entered By: Linton Ham on 02/19/2019 12:11:29 [name] ([id number]) -------------------------------------------------------------------------------- [place] Details Patient Name: Dylan Robles, Dylan Robles. Date of Service: 02/19/2019 Medical Record Number: [id number] Patient Account Number: [id number] Date of Birth/Sex: 10-05-1940 (79 y.o. M) Treating RN: Cornell Barman Primary Care Provider: Lucia Gaskins Other Clinician: Referring Provider: Lucia Gaskins Treating Provider/Extender: Tito Dine in Treatment: 5 Diagnosis Coding ICD-10 Codes Code Description E11.621 Type 2 diabetes mellitus with foot ulcer M86.672 Other chronic osteomyelitis, left ankle and foot E11.51 Type 2 diabetes mellitus with diabetic peripheral angiopathy without gangrene L97.524 Non-pressure chronic ulcer of other part of left foot with necrosis of bone L97.528 Non-pressure chronic ulcer of other part of left foot with other specified severity E11.42 Type 2 diabetes mellitus with diabetic polyneuropathy Facility Procedures CPT4 Code: [id number] Description: [id number] - DEBRIDE WOUND 1ST 20 SQ CM OR < ICD-10 Diagnosis Description L97.524 Non-pressure chronic ulcer of other part of left foot with necros Modifier: is of bone Quantity: 1 Physician Procedures CPT4 Code: [id number] Description: [id number] - WC PHYS DEBR WO ANESTH 20 SQ CM ICD-10 Diagnosis Description L97.524 Non-pressure chronic ulcer of other part of left foot with  necros Modifier: is of bone Quantity: 1 Electronic Signature(s) Signed: 02/19/2019 5:43:36 PM By: Linton Ham MD Entered By: Linton Ham on 02/19/2019 12:12:29

## 2019-02-20 NOTE — Progress Notes (Signed)
MARKAVIOUS, HETTINGER ([id number]) Visit Report for 02/19/2019 Arrival Information Details Patient Name: Dylan Robles, Dylan Robles. Date of Service: 02/19/2019 10:45 AM Medical Record Number: [id number] Patient Account Number: [id number] Date of Birth/Sex: November 06, 1940 (79 y.o. M) Treating RN: Army Melia Primary Care Kaeo Jacome: Lucia Gaskins Other Clinician: Referring Tamya Denardo: Lucia Gaskins Treating Flavia Bruss/Extender: Tito Dine in Treatment: 5 Visit Information History Since Last Visit Added or deleted any medications: No Patient Arrived: Wheel Chair Any new allergies or adverse reactions: No Arrival Time: 10:43 Had a fall or experienced change in No Accompanied By: spouse activities of daily living that may affect Transfer Assistance: None risk of falls: Patient Identification Verified: Yes Signs or symptoms of abuse/neglect since last visito No Patient Has Alerts: Yes Hospitalized since last visit: No Patient Alerts: ABI L.54 R.72 08/2018 Has Dressing in Place as Prescribed: Yes Pain Present Now: No Electronic Signature(s) Signed: 02/19/2019 11:18:27 AM By: Army Melia Entered By: Army Melia on 02/19/2019 10:43:39 [name] ([id number]) -------------------------------------------------------------------------------- Encounter Discharge Information Details Patient Name: Dylan Robles, Dylan Robles. Date of Service: 02/19/2019 10:45 AM Medical Record Number: [id number] Patient Account Number: [id number] Date of Birth/Sex: 1940/09/27 (79 y.o. M) Treating RN: Montey Hora Primary Care France Lusty: Lucia Gaskins Other Clinician: Referring Jospeh Mangel: Lucia Gaskins Treating Caliana Spires/Extender: Tito Dine in Treatment: 5 Encounter Discharge Information Items Post Procedure Vitals Discharge Condition: Stable Temperature (F): 98.8 Ambulatory Status: Wheelchair Pulse (bpm): 90 Discharge Destination: Home Respiratory Rate (breaths/min): 16 Transportation: Private  Auto Blood Pressure (mmHg): 118/62 Accompanied By: wife Schedule Follow-up Appointment: Yes Clinical Summary of Care: Electronic Signature(s) Signed: 02/19/2019 4:52:40 PM By: Montey Hora Entered By: Montey Hora on 02/19/2019 11:27:47 [name] ([id number]) -------------------------------------------------------------------------------- Lower Extremity Assessment Details Patient Name: Dylan Robles, Dylan Robles. Date of Service: 02/19/2019 10:45 AM Medical Record Number: [id number] Patient Account Number: [id number] Date of Birth/Sex: November 22, 1940 (79 y.o. M) Treating RN: Army Melia Primary Care Candiss Galeana: Lucia Gaskins Other Clinician: Referring Schon Zeiders: Lucia Gaskins Treating Anjelique Makar/Extender: Ricard Dillon Weeks in Treatment: 5 Edema Assessment Assessed: [Left: No] [Right: No] Edema: [Left: N] [Right: o] Vascular Assessment Pulses: Dorsalis Pedis Palpable: [Left:Yes] Electronic Signature(s) Signed: 02/19/2019 11:18:27 AM By: Army Melia Entered By: Army Melia on 02/19/2019 10:48:48 [name] ([id number]) -------------------------------------------------------------------------------- Multi Wound Chart Details Patient Name: Dylan Robles, Dylan Robles. Date of Service: 02/19/2019 10:45 AM Medical Record Number: [id number] Patient Account Number: [id number] Date of Birth/Sex: October 23, 1940 (79 y.o. M) Treating RN: Cornell Barman Primary Care Taraneh Metheney: Lucia Gaskins Other Clinician: Referring Luann Aspinwall: Lucia Gaskins Treating Kaylaann Mountz/Extender: Tito Dine in Treatment: 5 Vital Signs Height(in): 78 Pulse(bpm): 90 Weight(lbs): 206 Blood Pressure(mmHg): 118/62 Body Mass Index(BMI): 24 Temperature(F): 98.8 Respiratory Rate 16 (breaths/min): Photos: [N/A:N/A] Wound Location: Left Toe Great - Dorsal Left Foot - Lateral N/A Wounding Event: Gradually Appeared Gradually Appeared N/A Primary Etiology: Diabetic Wound/Ulcer of the Diabetic Wound/Ulcer of the  N/A Lower Extremity Lower Extremity Comorbid History: Congestive Heart Failure, Congestive Heart Failure, N/A Hypertension, Type II Diabetes Hypertension, Type II Diabetes Date Acquired: 07/10/2018 08/10/2018 N/A Weeks of Treatment: 5 5 N/A Wound Status: Open Open N/A Measurements L x W x D 0.1x0.1x0.1 0.2x0.3x0.1 N/A (cm) Area (cm) : 0.008 0.047 N/A Volume (cm) : 0.001 0.005 N/A % Reduction in Area: 98.80% 92.60% N/A % Reduction in Volume: 98.50% 96.10% N/A Classification: Grade 2 Grade 2 N/A Exudate Amount: Medium Medium N/A Exudate Type: Serous Serous N/A Exudate Color: amber amber N/A Wound Margin: Flat and Intact Flat and Intact N/A Granulation Amount: Small (1-33%)  Medium (34-66%) N/A Granulation Quality: N/A Pale N/A Necrotic Amount: Large (67-100%) Medium (34-66%) N/A Necrotic Tissue: Eschar, Adherent Blackey N/A Exposed Structures: Fat Layer (Subcutaneous Fat Layer (Subcutaneous N/A Tissue) Exposed: Yes Tissue) Exposed: Yes Fascia: No Fascia: No Tendon: No Tendon: No Muscle: No Muscle: No Dylan Robles, Dylan Robles ([id number]) Joint: No Joint: No Bone: No Bone: No Epithelialization: None None N/A Debridement: N/A Debridement - Selective/Open N/A Wound Pre-procedure N/A 11:15 N/A Verification/Time Out Taken: Pain Control: N/A Lidocaine N/A Tissue Debrided: N/A Callus N/A Level: N/A Skin/Dermis N/A Debridement Area (sq cm): N/A 0.25 N/A Instrument: N/A Curette N/A Bleeding: N/A None N/A Hemostasis Achieved: N/A Pressure N/A Debridement Treatment N/A Procedure was tolerated well N/A Response: Post Debridement N/A 0.2x0.3x0.1 N/A Measurements L x W x D (cm) Post Debridement Volume: N/A 0.005 N/A (cm) Procedures Performed: N/A Debridement N/A Treatment Notes Wound #1 (Left, Dorsal Toe Great) 1. Cleansed with: Cleanse wound with antibacterial soap and water 2. Anesthetic Topical Lidocaine 4% cream to wound bed prior to debridement 4. Dressing  Applied: Other dressing (specify in notes) 5. Secondary Dressing Applied ABD Pad Notes Silvercell,ABD, conform, tape Wound #2 (Left, Lateral Foot) 1. Cleansed with: Cleanse wound with antibacterial soap and water 2. Anesthetic Topical Lidocaine 4% cream to wound bed prior to debridement 4. Dressing Applied: Other dressing (specify in notes) 5. Secondary Dressing Applied ABD Pad Notes Silvercell,ABD, conform, tape Electronic Signature(s) Signed: 02/19/2019 5:43:36 PM By: Linton Ham MD Entered By: Linton Ham on 02/19/2019 12:00:52 Dylan Robles, Dylan Robles ([id number]) Dylan Robles, Dylan Robles ([id number]) -------------------------------------------------------------------------------- Multi-Disciplinary Care Plan Details Patient Name: Dylan Robles, Dylan Robles. Date of Service: 02/19/2019 10:45 AM Medical Record Number: [id number] Patient Account Number: [id number] Date of Birth/Sex: 04-25-1940 (79 y.o. M) Treating RN: Cornell Barman Primary Care Dorla Guizar: Lucia Gaskins Other Clinician: Referring Aunica Dauphinee: Lucia Gaskins Treating Monteen Toops/Extender: Tito Dine in Treatment: 5 Active Inactive Osteomyelitis Nursing Diagnoses: Infection: osteomyelitis Goals: Diagnostic evaluation for osteomyelitis completed as ordered Date Initiated: 02/12/2019 Target Resolution Date: 02/05/2019 Goal Status: Active Patient's osteomyelitis will resolve Date Initiated: 02/12/2019 Target Resolution Date: 03/19/2019 Goal Status: Active Interventions: Assess for signs and symptoms of osteomyelitis resolution every visit Provide education on osteomyelitis Treatment Activities: Systemic antibiotics : 02/12/2019 Notes: Pain, Acute or Chronic Nursing Diagnoses: Potential alteration in comfort, pain Goals: Patient will verbalize adequate pain control and receive pain control interventions during procedures as needed Date Initiated: 01/15/2019 Target Resolution Date: 02/12/2019 Goal Status:  Active Patient/caregiver will verbalize adequate pain control between visits Date Initiated: 01/15/2019 Target Resolution Date: 02/12/2019 Goal Status: Active Interventions: Provide education on pain management Reposition patient for comfort Treatment Activities: Administer pain control measures as ordered : 01/15/2019 [name] ([id number]) Notes: Peripheral Neuropathy Nursing Diagnoses: Knowledge deficit related to disease process and management of peripheral neurovascular dysfunction Goals: Patient/caregiver will verbalize understanding of disease process and disease management Date Initiated: 01/15/2019 Target Resolution Date: 02/12/2019 Goal Status: Active Interventions: Assess signs and symptoms of neuropathy upon admission and as needed Provide education on Management of Neuropathy and Related Ulcers Notes: Soft Tissue Infection Nursing Diagnoses: Impaired tissue integrity Goals: Patient will remain free of wound infection Date Initiated: 01/15/2019 Target Resolution Date: 01/15/2019 Goal Status: Active Interventions: Assess signs and symptoms of infection every visit Notes: Tissue Oxygenation Nursing Diagnoses: Actual ineffective tissue perfusion; peripheral (select once diagnosis is confirmed) Goals: Invasive arterial studies completed as ordered Date Initiated: 01/15/2019 Target Resolution Date: 02/05/2019 Goal Status: Active Non-invasive arterial studies are completed as ordered Date Initiated: 01/15/2019 Target  Resolution Date: 02/05/2019 Goal Status: Active Interventions: Assess patient understanding of disease process and management upon diagnosis and as needed Assess peripheral arterial status upon admission and as needed Notes: Wound/Skin Impairment Dylan Robles, Dylan Robles ([id number]) Nursing Diagnoses: Impaired tissue integrity Goals: Patient/caregiver will verbalize understanding of skin care regimen Date Initiated: 01/15/2019 Target Resolution Date: 02/05/2019 Goal  Status: Active Ulcer/skin breakdown will have a volume reduction of 30% by week 4 Date Initiated: 01/15/2019 Target Resolution Date: 02/12/2019 Goal Status: Active Interventions: Assess ulceration(s) every visit Treatment Activities: Referred to DME Zorian Gunderman for dressing supplies : 01/15/2019 Notes: Electronic Signature(s) Signed: 02/19/2019 5:56:57 PM By: Gretta Cool, BSN, RN, CWS, Kim RN, BSN Entered By: Gretta Cool, BSN, RN, CWS, Kim on 02/19/2019 11:16:01 [name] ([id number]) -------------------------------------------------------------------------------- Pain Assessment Details Patient Name: Dylan Robles, Dylan Robles. Date of Service: 02/19/2019 10:45 AM Medical Record Number: [id number] Patient Account Number: [id number] Date of Birth/Sex: [birth year] (79 y.o. M) Treating RN: Army Melia Primary Care Orhan Mayorga: Lucia Gaskins Other Clinician: Referring Blaze Sandin: Lucia Gaskins Treating Harpreet Signore/Extender: Tito Dine in Treatment: 5 Active Problems Location of Pain Severity and Description of Pain Patient Has Paino No Site Locations Pain Management and Medication Current Pain Management: Electronic Signature(s) Signed: 02/19/2019 11:18:27 AM By: Army Melia Entered By: Army Melia on 02/19/2019 10:43:47 [name] ([id number]) -------------------------------------------------------------------------------- Patient/Caregiver Education Details Patient Name: Dylan Robles, Dylan Robles. Date of Service: 02/19/2019 10:45 AM Medical Record Number: [id number] Patient Account Number: [id number] Date of Birth/Gender: [birth year] (79 y.o. M) Treating RN: Cornell Barman Primary Care Physician: Lucia Gaskins Other Clinician: Referring Physician: Lucia Gaskins Treating Physician/Extender: Tito Dine in Treatment: 5 Education Assessment Education Provided To: Patient Education Topics Provided Wound/Skin Impairment: Handouts: Caring for Your Ulcer, Other: wound care as  prescribed Methods: Demonstration, Explain/Verbal Responses: State content correctly Electronic Signature(s) Signed: 02/19/2019 5:56:57 PM By: Gretta Cool, BSN, RN, CWS, Kim RN, BSN Entered By: Gretta Cool, BSN, RN, CWS, Kim on 02/19/2019 11:22:16 [name] ([id number]) -------------------------------------------------------------------------------- Wound Assessment Details Patient Name: Dylan Robles, Dylan Robles. Date of Service: 02/19/2019 10:45 AM Medical Record Number: [id number] Patient Account Number: [id number] Date of Birth/Sex: 06-26-1940 (79 y.o. M) Treating RN: Army Melia Primary Care Antawn Sison: Lucia Gaskins Other Clinician: Referring Amaryllis Malmquist: Lucia Gaskins Treating Anias Bartol/Extender: Tito Dine in Treatment: 5 Wound Status Wound Number: 1 Primary Diabetic Wound/Ulcer of the Lower Extremity Etiology: Wound Location: Left Toe Great - Dorsal Wound Status: Open Wounding Event: Gradually Appeared Comorbid Congestive Heart Failure, Hypertension, Date Acquired: 07/10/2018 History: Type II Diabetes Weeks Of Treatment: 5 Clustered Wound: No Photos Wound Measurements Length: (cm) 0.1 % Reductio Width: (cm) 0.1 % Reductio Depth: (cm) 0.1 Epithelial Area: (cm) 0.008 Volume: (cm) 0.001 n in Area: 98.8% n in Volume: 98.5% ization: None Wound Description Classification: Grade 2 Foul Odor Wound Margin: Flat and Intact Slough/Fib Exudate Amount: Medium Exudate Type: Serous Exudate Color: amber After Cleansing: No rino Yes Wound Bed Granulation Amount: Small (1-33%) Exposed Structure Necrotic Amount: Large (67-100%) Fascia Exposed: No Necrotic Quality: Eschar, Adherent Slough Fat Layer (Subcutaneous Tissue) Exposed: Yes Tendon Exposed: No Muscle Exposed: No Joint Exposed: No Bone Exposed: No Treatment Notes Dylan Robles, ALTIDOR ([id number]) Wound #1 (Left, Dorsal Toe Great) 1. Cleansed with: Cleanse wound with antibacterial soap and water 2. Anesthetic Topical  Lidocaine 4% cream to wound bed prior to debridement 4. Dressing Applied: Other dressing (specify in notes) 5. Secondary Dressing Applied ABD Pad Notes Silvercell,ABD, conform, tape Electronic Signature(s) Signed: 02/19/2019 11:18:27 AM By: Army Melia Entered By: Army Melia  on 02/19/2019 10:48:21 SIRIS, GRAUS ([id number]) -------------------------------------------------------------------------------- Wound Assessment Details Patient Name: ADRIAN, KUKUK. Date of Service: 02/19/2019 10:45 AM Medical Record Number: [id number] Patient Account Number: [id number] Date of Birth/Sex: 1940-03-04 (78 y.o. M) Treating RN: Army Melia Primary Care Madicyn Mesina: Lucia Gaskins Other Clinician: Referring Pietro Bonura: Lucia Gaskins Treating Alara Daniel/Extender: Tito Dine in Treatment: 5 Wound Status Wound Number: 2 Primary Diabetic Wound/Ulcer of the Lower Extremity Etiology: Wound Location: Left Foot - Lateral Wound Status: Open Wounding Event: Gradually Appeared Comorbid Congestive Heart Failure, Hypertension, Date Acquired: 08/10/2018 History: Type II Diabetes Weeks Of Treatment: 5 Clustered Wound: No Photos Wound Measurements Length: (cm) 0.2 % Reduction i Width: (cm) 0.3 % Reduction i Depth: (cm) 0.1 Epithelializa Area: (cm) 0.047 Volume: (cm) 0.005 n Area: 92.6% n Volume: 96.1% tion: None Wound Description Classification: Grade 2 Foul Odor Aft Wound Margin: Flat and Intact Slough/Fibrin Exudate Amount: Medium Exudate Type: Serous Exudate Color: amber er Cleansing: No o Yes Wound Bed Granulation Amount: Medium (34-66%) Exposed Structure Granulation Quality: Pale Fascia Exposed: No Necrotic Amount: Medium (34-66%) Fat Layer (Subcutaneous Tissue) Exposed: Yes Necrotic Quality: Adherent Slough Tendon Exposed: No Muscle Exposed: No Joint Exposed: No Bone Exposed: No Treatment Notes DENIEL, BLAKEMAN ([id number]) Wound #2 (Left, Lateral Foot) 1. Cleansed  with: Cleanse wound with antibacterial soap and water 2. Anesthetic Topical Lidocaine 4% cream to wound bed prior to debridement 4. Dressing Applied: Other dressing (specify in notes) 5. Secondary Dressing Applied ABD Pad Notes Silvercell,ABD, conform, tape Electronic Signature(s) Signed: 02/19/2019 11:18:27 AM By: Army Melia Entered By: Army Melia on 02/19/2019 10:48:37 [name] ([id number]) -------------------------------------------------------------------------------- Vitals Details Patient Name: TRIEU, KOREY. Date of Service: 02/19/2019 10:45 AM Medical Record Number: [id number] Patient Account Number: [id number] Date of Birth/Sex: 07-14-1940 (79 y.o. M) Treating RN: Army Melia Primary Care Pansey Pinheiro: Lucia Gaskins Other Clinician: Referring Yasmine Kilbourne: Lucia Gaskins Treating Daijanae Rafalski/Extender: Tito Dine in Treatment: 5 Vital Signs Time Taken: 10:43 Temperature (F): 98.8 Height (in): 78 Pulse (bpm): 90 Weight (lbs): 206 Respiratory Rate (breaths/min): 16 Body Mass Index (BMI): 23.8 Blood Pressure (mmHg): 118/62 Reference Range: 80 - 120 mg / dl Electronic Signature(s) Signed: 02/19/2019 11:18:27 AM By: Army Melia Entered By: Army Melia on 02/19/2019 10:44:17

## 2019-02-20 NOTE — Progress Notes (Signed)
NIKOLOS, CORKILL ([id number]) Visit Report for 02/12/2019 HPI Details Patient Name: Dylan Robles, Dylan Robles. Date of Service: 02/12/2019 10:45 AM Medical Record Number: [id number] Patient Account Number: [id number] Date of Birth/Sex: 20-Mar-1940 (79 y.o. M) Treating RN: Cornell Barman Primary Care Provider: Lucia Gaskins Other Clinician: Referring Provider: Lucia Gaskins Treating Provider/Extender: Tito Dine in Treatment: 4 History of Present Illness HPI Description: ADMISSION 01/15/2019 Patient is a pleasant 79 year old type II diabetic man who arrives accompanied by his wife from [place]. His story begins in early September 2020. He developed purulence from the corner of his left great toe. He saw his podiatrist Dr. Sharyon Cable who diagnosed a severe chronic paronychia. He had total avulsion of the nail because of the paronychia today on 9/17. He was followed weekly by Dr. Sharyon Cable developed a nonhealing area on the nailbed of the left great toe although his pain was improved. On 10/20/2018 he was noted to have a new wound on the left lateral foot to which she is was applying Neosporin. His dressings were changed to peroxide to the nailbed and Silvadene on 10/27. On 12/7 it was noted that he had had previous Dopplers ordered by his primary care doctor in August. These were really quite poor. ABI on the right was 0.72, 0.44 TBI. On the left 0.54 ABI with a TBI of 0.20. Monophasic waveforms bilaterally. He was referred to Dr. Delana Meyer. On 12/15 he underwent an angiogram and had a stent placed in the left superficial femoral artery along with angioplasty. Since then his pain is a lot better. He is still using Silvadene to the wounds. He was prescribed doxycycline about 2 weeks ago which he finished last week. Past medical history; type 2 diabetes, BPH, hypertension, coronary artery disease, hyperlipidemia and PAD ABIs were not obtainable in the clinic today. 1/13; type II diabetic man  who has wounds on the left great toenail bed on his first toe and the left lateral foot. Both of these wounds accompanied by really significant PAD status post stenting of the left superficial femoral artery by Dr. Delana Meyer on 12/15. He is not describing claudication pain however he seems to be limiting his activity. We have been using Santyl to both wound areas. X-ray of the left foot did not show any osseous erosions or periostitis to suggest osteomyelitis. 1/20; the area on his left lateral foot improved. Surface area smaller wound bed healthy we have been using Santyl. oThe real problem is the nail bed on the left great toe. Still exposed bone although better looking granulation tissue today. 1/27; patient has wounds on the left lateral foot and the nailbed of the left great toe. Bone that I sent for culture from the left great toe last time came back showing abundant Enterobacter Asburiae. We have been using Santyl 2/3; patient has wounds on the left lateral foot and the nailbed of the left great toe. He has osteomyelitis in the distal phalanx of the left great toe which I previously biopsied showing Enterobacter. I have him on Levaquin he has been on this for a week he is tolerating this well without significant side effects. The wound that was there on the nailbed and tip of the left great toe has closed over. The area on the left lateral foot is smaller Electronic Signature(s) Signed: 02/13/2019 5:08:55 PM By: Gretta Cool, BSN, RN, CWS, Kim RN, BSN Signed: 02/19/2019 5:43:36 PM By: Linton Ham MD Previous Signature: 02/12/2019 4:41:52 PM Version By: Linton Ham MD Entered By: Gretta Cool, BSN,  RN, CWS, Kim on 02/13/2019 17:08:54 DOMER, LOFFREDO ([id number]) SHYAN, DIECKHOFF ([id number]) -------------------------------------------------------------------------------- Physical Exam Details Patient Name: Dylan, Robles. Date of Service: 02/12/2019 10:45 AM Medical Record Number: [id number] Patient  Account Number: [id number] Date of Birth/Sex: April 12, 1940 (79 y.o. M) Treating RN: Cornell Barman Primary Care Provider: Lucia Gaskins Other Clinician: Referring Provider: Lucia Gaskins Treating Provider/Extender: Tito Dine in Treatment: 4 Constitutional Patient is hypertensive.. Pulse regular and within target range for patient.[name] Respirations regular, non-labored and within target range.. Temperature is normal and within the target range for the patient.[name] appears in no distress. Respiratory Respiratory effort is easy and symmetric bilaterally. Rate is normal at rest and on room air.. Cardiovascular Pedal pulses are reduced. Psychiatric No evidence of depression, anxiety, or agitation. Calm, cooperative, and communicative. Appropriate interactions and affect.. Notes Wound exam oThe nailbed of the left great toe. This is closed over and I believe the surface is viable. The nail that I removed there is returning I do not see a problem here. No debridement was required oArea on the left lateral foot looks a lot better. No debridement required surface area is smaller Electronic Signature(s) Signed: 02/13/2019 5:09:08 PM By: Gretta Cool, BSN, RN, CWS, Kim RN, BSN Signed: 02/19/2019 5:43:36 PM By: Linton Ham MD Previous Signature: 02/12/2019 4:41:52 PM Version By: Linton Ham MD Entered By: Gretta Cool, BSN, RN, CWS, Kim on 02/13/2019 17:09:08 HOOPER, GRIESEL ([id number]) -------------------------------------------------------------------------------- Physician Orders Details Patient Name: Dylan, Robles. Date of Service: 02/12/2019 10:45 AM Medical Record Number: [id number] Patient Account Number: [id number] Date of Birth/Sex: 02/20/1940 (79 y.o. M) Treating RN: Cornell Barman Primary Care Provider: Lucia Gaskins Other Clinician: Referring Provider: Lucia Gaskins Treating Provider/Extender: Tito Dine in Treatment: 4 Verbal / Phone Orders: No Diagnosis Coding Wound  Cleansing Wound #1 Left,Dorsal Toe Great o Clean wound with Normal Saline. o May Shower, gently pat wound dry prior to applying new dressing. Wound #2 Left,Lateral Foot o Clean wound with Normal Saline. o May Shower, gently pat wound dry prior to applying new dressing. Anesthetic (add to Medication List) Wound #1 Left,Dorsal Toe Great o Topical Lidocaine 4% cream applied to wound bed prior to debridement (In Clinic Only). Wound #2 Left,Lateral Foot o Topical Lidocaine 4% cream applied to wound bed prior to debridement (In Clinic Only). Primary Wound Dressing Wound #1 Left,Dorsal Toe Great o Silver Alginate Wound #2 Left,Lateral Foot o Silver Alginate Secondary Dressing Wound #1 Left,Dorsal Toe Great o Gauze and Kerlix/Conform o Gauze and Kerlix/Conform Wound #2 Left,Lateral Foot o Gauze and Kerlix/Conform o Gauze and Kerlix/Conform Dressing Change Frequency Wound #1 Left,Dorsal Toe Great o Change Dressing Monday, Wednesday, Friday Wound #2 Left,Lateral Foot o Change Dressing Monday, Wednesday, Friday Follow-up Appointments Wound #1 Left,Dorsal Toe Great o Return Appointment in 1 week. ABSHIR, MORIS ([id number]) Wound #2 Left,Lateral Foot o Return Appointment in 1 week. Edema Control Wound #1 Left,Dorsal Toe Great o Elevate legs to the level of the heart and pump ankles as often as possible Wound #2 Left,Lateral Foot o Elevate legs to the level of the heart and pump ankles as often as possible Off-Loading Wound #1 Left,Dorsal Toe Great o Open toe surgical shoe Wound #2 Left,Lateral Foot o Open toe surgical shoe Medications-please add to medication list. Wound #1 Left,Dorsal Toe Great o P.O. Antibiotics - Continue long term round of Cipro Wound #2 Left,Lateral Foot o P.O. Antibiotics - Continue long term round of Cipro Electronic Signature(s) Signed: 02/13/2019 5:22:08 PM By: Gretta Cool, BSN, RN,  CWS, Romie Minus, BSN Signed:  02/19/2019 5:43:36 PM By: Linton Ham MD Entered By: Gretta Cool, BSN, RN, CWS, Kim on 02/13/2019 17:07:54 RAYMON, GATEWOOD ([id number]) -------------------------------------------------------------------------------- Problem List Details Patient Name: MARSH, BEDOY. Date of Service: 02/12/2019 10:45 AM Medical Record Number: [id number] Patient Account Number: [id number] Date of Birth/Sex: August 15, 1940 (79 y.o. M) Treating RN: Cornell Barman Primary Care Provider: Lucia Gaskins Other Clinician: Referring Provider: Lucia Gaskins Treating Provider/Extender: Tito Dine in Treatment: 4 Active Problems ICD-10 Evaluated Encounter Code Description Active Date Today Diagnosis E11.621 Type 2 diabetes mellitus with foot ulcer 01/15/2019 No Yes M86.672 Other chronic osteomyelitis, left ankle and foot 02/05/2019 No Yes E11.51 Type 2 diabetes mellitus with diabetic peripheral angiopathy 01/15/2019 No Yes without gangrene L97.524 Non-pressure chronic ulcer of other part of left foot with 01/15/2019 No Yes necrosis of bone L97.528 Non-pressure chronic ulcer of other part of left foot with other 01/15/2019 No Yes specified severity E11.42 Type 2 diabetes mellitus with diabetic polyneuropathy 01/15/2019 No Yes Inactive Problems Resolved Problems Electronic Signature(s) Signed: 02/13/2019 5:08:34 PM By: Gretta Cool, BSN, RN, CWS, Kim RN, BSN Signed: 02/19/2019 5:43:36 PM By: Linton Ham MD Previous Signature: 02/12/2019 4:41:52 PM Version By: Linton Ham MD Entered By: Gretta Cool, BSN, RN, CWS, Kim on 02/13/2019 17:08:34 TUKKER, CEBOLLERO ([id number]) -------------------------------------------------------------------------------- Progress Note Details Patient Name: GEMINI, BLANCHETT. Date of Service: 02/12/2019 10:45 AM Medical Record Number: [id number] Patient Account Number: [id number] Date of Birth/Sex: April 16, 1940 (79 y.o. M) Treating RN: Cornell Barman Primary Care Provider: Lucia Gaskins Other  Clinician: Referring Provider: Lucia Gaskins Treating Provider/Extender: Melburn Hake, HOYT Weeks in Treatment: 4 Subjective History of Present Illness (HPI) ADMISSION 01/15/2019 Patient is a pleasant 79 year old type II diabetic man who arrives accompanied by his wife from [place]. His story begins in early September 2020. He developed purulence from the corner of his left great toe. He saw his podiatrist Dr. Sharyon Cable who diagnosed a severe chronic paronychia. He had total avulsion of the nail because of the paronychia today on 9/17. He was followed weekly by Dr. Sharyon Cable developed a nonhealing area on the nailbed of the left great toe although his pain was improved. On 10/20/2018 he was noted to have a new wound on the left lateral foot to which she is was applying Neosporin. His dressings were changed to peroxide to the nailbed and Silvadene on 10/27. On 12/7 it was noted that he had had previous Dopplers ordered by his primary care doctor in August. These were really quite poor. ABI on the right was 0.72, 0.44 TBI. On the left 0.54 ABI with a TBI of 0.20. Monophasic waveforms bilaterally. He was referred to Dr. Delana Meyer. On 12/15 he underwent an angiogram and had a stent placed in the left superficial femoral artery along with angioplasty. Since then his pain is a lot better. He is still using Silvadene to the wounds. He was prescribed doxycycline about 2 weeks ago which he finished last week. Past medical history; type 2 diabetes, BPH, hypertension, coronary artery disease, hyperlipidemia and PAD ABIs were not obtainable in the clinic today. 1/13; type II diabetic man who has wounds on the left great toenail bed on his first toe and the left lateral foot. Both of these wounds accompanied by really significant PAD status post stenting of the left superficial femoral artery by Dr. Delana Meyer on 12/15. He is not describing claudication pain however he seems to be limiting his  activity. We have been using Santyl to both wound areas.  X-ray of the left foot did not show any osseous erosions or periostitis to suggest osteomyelitis. 1/20; the area on his left lateral foot improved. Surface area smaller wound bed healthy we have been using Santyl. The real problem is the nail bed on the left great toe. Still exposed bone although better looking granulation tissue today. 1/27; patient has wounds on the left lateral foot and the nailbed of the left great toe. Bone that I sent for culture from the left great toe last time came back showing abundant Enterobacter Asburiae. We have been using Santyl 2/3; patient has wounds on the left lateral foot and the nailbed of the left great toe. He has osteomyelitis in the distal phalanx of the left great toe which I previously biopsied showing Enterobacter. I have him on Levaquin he has been on this for a week he is tolerating this well without significant side effects. The wound that was there on the nailbed and tip of the left great toe has closed over. The area on the left lateral foot is smaller Objective CASY, KUSNER ([id number]) Constitutional Patient is hypertensive.. Pulse regular and within target range for patient.[name] Respirations regular, non-labored and within target range.. Temperature is normal and within the target range for the patient.[name] appears in no distress. Vitals Time Taken: 10:45 AM, Height: 78 in, Weight: 206 lbs, BMI: 23.8, Temperature: 98.8 F, Pulse: 72 bpm, Respiratory Rate: 16 breaths/min, Blood Pressure: 145/73 mmHg. Respiratory Respiratory effort is easy and symmetric bilaterally. Rate is normal at rest and on room air.. Cardiovascular Pedal pulses are reduced. Psychiatric No evidence of depression, anxiety, or agitation. Calm, cooperative, and communicative. Appropriate interactions and affect.. General Notes: Wound exam The nailbed of the left great toe. This is closed over and I believe the surface  is viable. The nail that I removed there is returning I do not see a problem here. No debridement was required Area on the left lateral foot looks a lot better. No debridement required surface area is smaller Integumentary (Hair, Skin) Wound #1 status is Open. Original cause of wound was Gradually Appeared. The wound is located on the [employer]. The wound measures 0.1cm length x 0.1cm width x 0.1cm depth; 0.008cm^2 area and 0.001cm^3 volume. There is Fat Layer (Subcutaneous Tissue) Exposed exposed. There is a medium amount of serous drainage noted. The wound margin is flat and intact. There is small (1-33%) granulation within the wound bed. There is a large (67-100%) amount of necrotic tissue within the wound bed including Eschar and Adherent Slough. Wound #2 status is Open. Original cause of wound was Gradually Appeared. The wound is located on the Left,Lateral Foot. The wound measures 0.4cm length x 0.6cm width x 0.2cm depth; 0.188cm^2 area and 0.038cm^3 volume. There is Fat Layer (Subcutaneous Tissue) Exposed exposed. There is a medium amount of serous drainage noted. The wound margin is flat and intact. There is medium (34-66%) pale granulation within the wound bed. There is a medium (34-66%) amount of necrotic tissue within the wound bed including Adherent Slough. Assessment Active Problems ICD-10 Type 2 diabetes mellitus with foot ulcer Other chronic osteomyelitis, left ankle and foot Type 2 diabetes mellitus with diabetic peripheral angiopathy without gangrene Non-pressure chronic ulcer of other part of left foot with necrosis of bone Non-pressure chronic ulcer of other part of left foot with other specified severity Type 2 diabetes mellitus with diabetic polyneuropathy Plan AUBRY, RIEHM ([id number]) Wound Cleansing: Wound #1 Left,Dorsal Toe Great: Clean wound with Normal Saline.  May Shower, gently pat wound dry prior to applying new dressing. Wound #2 Left,Lateral  Foot: Clean wound with Normal Saline. May Shower, gently pat wound dry prior to applying new dressing. Anesthetic (add to Medication List): Wound #1 Left,Dorsal Toe Great: Topical Lidocaine 4% cream applied to wound bed prior to debridement (In Clinic Only). Wound #2 Left,Lateral Foot: Topical Lidocaine 4% cream applied to wound bed prior to debridement (In Clinic Only). Primary Wound Dressing: Wound #1 Left,Dorsal Toe Great: Silver Alginate Wound #2 Left,Lateral Foot: Silver Alginate Secondary Dressing: Wound #1 Left,Dorsal Toe Great: Gauze and Kerlix/Conform Gauze and Kerlix/Conform Wound #2 Left,Lateral Foot: Gauze and Kerlix/Conform Gauze and Kerlix/Conform Dressing Change Frequency: Wound #1 Left,Dorsal Toe Great: Change Dressing Monday, Wednesday, Friday Wound #2 Left,Lateral Foot: Change Dressing Monday, Wednesday, Friday Follow-up Appointments: Wound #1 Left,Dorsal Toe Great: Return Appointment in 1 week. Wound #2 Left,Lateral Foot: Return Appointment in 1 week. Edema Control: Wound #1 Left,Dorsal Toe Great: Elevate legs to the level of the heart and pump ankles as often as possible Wound #2 Left,Lateral Foot: Elevate legs to the level of the heart and pump ankles as often as possible Off-Loading: Wound #1 Left,Dorsal Toe Great: Open toe surgical shoe Wound #2 Left,Lateral Foot: Open toe surgical shoe Medications-please add to medication list.: Wound #1 Left,Dorsal Toe Great: P.O. Antibiotics - Continue long term round of Cipro Wound #2 Left,Lateral Foot: P.O. Antibiotics - Continue long term round of Cipro 1. I am continue with silver alginate to both areas 2. At least 4 weeks of Levaquin will be necessary for the underlying osteomyelitis perhaps more. He is tolerating this was without side effects 3. I went over the issue that even after closure of the wound on his toe this area will require monitoring for as long as 9 to 12 months for return of symptoms  suggesting recurrence of underlying osteomyelitis KAMDIN, KENON ([id number]) Electronic Signature(s) Signed: 02/12/2019 4:41:52 PM By: Linton Ham MD Signed: 02/17/2019 5:41:25 PM By: Worthy Keeler PA-C Entered By: Linton Ham on 02/12/2019 12:23:47 STATEN, SALMO ([id number]) -------------------------------------------------------------------------------- SuperBill Details Patient Name: TAJINDER, DINGLEY. Date of Service: 02/12/2019 Medical Record Number: [id number] Patient Account Number: [id number] Date of Birth/Sex: 03/24/1940 (79 y.o. M) Treating RN: Cornell Barman Primary Care Provider: Lucia Gaskins Other Clinician: Referring Provider: Lucia Gaskins Treating Provider/Extender: Tito Dine in Treatment: 4 Diagnosis Coding ICD-10 Codes Code Description E11.621 Type 2 diabetes mellitus with foot ulcer M86.672 Other chronic osteomyelitis, left ankle and foot E11.51 Type 2 diabetes mellitus with diabetic peripheral angiopathy without gangrene L97.524 Non-pressure chronic ulcer of other part of left foot with necrosis of bone L97.528 Non-pressure chronic ulcer of other part of left foot with other specified severity E11.42 Type 2 diabetes mellitus with diabetic polyneuropathy Facility Procedures CPT4 Code: [id number] Description: [id number] - WOUND CARE VISIT-LEV 2 EST PT Modifier: Quantity: 1 Physician Procedures CPT4 Code Description: [id number] - WC PHYS LEVEL 3 - EST PT ICD-10 Diagnosis Description L97.524 Non-pressure chronic ulcer of other part of left foot with necro L97.528 Non-pressure chronic ulcer of other part of left foot with other M86.672 Other  chronic osteomyelitis, left ankle and foot Modifier: sis of bone specified severi Quantity: 1 ty Electronic Signature(s) Signed: 02/13/2019 5:08:15 PM By: Gretta Cool, BSN, RN, CWS, Kim RN, BSN Signed: 02/19/2019 5:43:36 PM By: Linton Ham MD Previous Signature: 02/13/2019 4:38:37 PM Version By: Sharon Mt Previous  Signature: 02/12/2019 4:41:52 PM Version By: Linton Ham MD Entered By: Gretta Cool, BSN, RN, CWS, Kim  on 02/13/2019 17:08:14

## 2019-02-26 ENCOUNTER — Encounter: Payer: Medicare HMO | Admitting: Internal Medicine

## 2019-02-28 ENCOUNTER — Other Ambulatory Visit (INDEPENDENT_AMBULATORY_CARE_PROVIDER_SITE_OTHER): Payer: Self-pay | Admitting: Nurse Practitioner

## 2019-02-28 DIAGNOSIS — L97529 Non-pressure chronic ulcer of other part of left foot with unspecified severity: Secondary | ICD-10-CM

## 2019-02-28 NOTE — Progress Notes (Signed)
Dylan, Robles ([id number]) Visit Report for 02/05/2019 Debridement Details Patient Name: Dylan Robles, Dylan Robles. Date of Service: 02/05/2019 1:45 PM Medical Record Number: [id number] Patient Account Number: [id number] Date of Birth/Sex: 25-Oct-1940 (79 y.o. M) Treating RN: Cornell Barman Primary Care Provider: Lucia Gaskins Other Clinician: Referring Provider: Lucia Gaskins Treating Provider/Extender: Tito Dine in Treatment: 3 Debridement Performed for Wound #2 Left,Lateral Foot Assessment: Performed By: Physician Ricard Dillon, MD Debridement Type: Debridement Severity of Tissue Pre Fat layer exposed Debridement: Level of Consciousness (Pre- Awake and Alert procedure): Pre-procedure Verification/Time Yes - 14:20 Out Taken: Start Time: 14:20 Pain Control: Lidocaine Total Area Debrided (L x W): 0.5 (cm) x 1 (cm) = 0.5 (cm) Tissue and other material Viable, Non-Viable, Slough, Subcutaneous, Slough debrided: Level: Skin/Subcutaneous Tissue Debridement Description: Excisional Instrument: Curette Bleeding: Minimum Hemostasis Achieved: Pressure End Time: 14:47 Response to Treatment: Procedure was tolerated well Level of Consciousness Awake and Alert (Post-procedure): Post Debridement Measurements of Total Wound Length: (cm) 0.5 Width: (cm) 1 Depth: (cm) 0.3 Volume: (cm) 0.118 Character of Wound/Ulcer Post Debridement: Stable Severity of Tissue Post Debridement: Fat layer exposed Post Procedure Diagnosis Same as Pre-procedure Electronic Signature(s) Signed: 02/05/2019 4:18:13 PM By: Linton Ham MD Signed: 02/28/2019 11:50:22 AM By: Gretta Cool, BSN, RN, CWS, Kim RN, BSN Entered By: Linton Ham on 02/05/2019 15:28:36 Dylan Robles ([id number]) -------------------------------------------------------------------------------- HPI Details Patient Name: Dylan Robles, Dylan Robles. Date of Service: 02/05/2019 1:45 PM Medical Record Number: [id number] Patient Account  Number: [id number] Date of Birth/Sex: [birth year] (79 y.o. M) Treating RN: Cornell Barman Primary Care Provider: Lucia Gaskins Other Clinician: Referring Provider: Lucia Gaskins Treating Provider/Extender: Tito Dine in Treatment: 3 History of Present Illness HPI Description: ADMISSION 01/15/2019 Patient is a pleasant 79 year old type II diabetic man who arrives accompanied by his wife from [place]. His story begins in early September 2020. He developed purulence from the corner of his left great toe. He saw his podiatrist Dr. Sharyon Cable who diagnosed a severe chronic paronychia. He had total avulsion of the nail because of the paronychia today on 9/17. He was followed weekly by Dr. Sharyon Cable developed a nonhealing area on the nailbed of the left great toe although his pain was improved. On 10/20/2018 he was noted to have a new wound on the left lateral foot to which she is was applying Neosporin. His dressings were changed to peroxide to the nailbed and Silvadene on 10/27. On 12/7 it was noted that he had had previous Dopplers ordered by his primary care doctor in August. These were really quite poor. ABI on the right was 0.72, 0.44 TBI. On the left 0.54 ABI with a TBI of 0.20. Monophasic waveforms bilaterally. He was referred to Dr. Delana Meyer. On 12/15 he underwent an angiogram and had a stent placed in the left superficial femoral artery along with angioplasty. Since then his pain is a lot better. He is still using Silvadene to the wounds. He was prescribed doxycycline about 2 weeks ago which he finished last week. Past medical history; type 2 diabetes, BPH, hypertension, coronary artery disease, hyperlipidemia and PAD ABIs were not obtainable in the clinic today. 1/13; type II diabetic man who has wounds on the left great toenail bed on his first toe and the left lateral foot. Both of these wounds accompanied by really significant PAD status post stenting of the left  superficial femoral artery by Dr. Delana Meyer on 12/15. He is not describing claudication pain however he seems to be limiting his activity. We  have been using Santyl to both wound areas. X-ray of the left foot did not show any osseous erosions or periostitis to suggest osteomyelitis. 1/20; the area on his left lateral foot improved. Surface area smaller wound bed healthy we have been using Santyl. oThe real problem is the nail bed on the left great toe. Still exposed bone although better looking granulation tissue today. 1/27; patient has wounds on the left lateral foot and the nailbed of the left great toe. Bone that I sent for culture from the left great toe last time came back showing abundant Enterobacter Asburiae. We have been using Environmental health practitioner) Signed: 02/05/2019 4:18:13 PM By: Linton Ham MD Entered By: Linton Ham on 02/05/2019 15:29:29 Dylan Robles ([id number]) -------------------------------------------------------------------------------- Physical Exam Details Patient Name: Dylan Robles, Dylan Robles. Date of Service: 02/05/2019 1:45 PM Medical Record Number: [id number] Patient Account Number: [id number] Date of Birth/Sex: Jan 30, 1940 (79 y.o. M) Treating RN: Cornell Barman Primary Care Provider: Lucia Gaskins Other Clinician: Referring Provider: Lucia Gaskins Treating Provider/Extender: Tito Dine in Treatment: 3 Constitutional Patient is hypotensive. However he feels well. Pulse regular and within target range for patient.[name] Respirations regular, non- labored and within target range.. Patient is mildly febrile today. appears in no distress. Cardiovascular Pedal pulses are palpable on the left. Integumentary (Hair, Skin) No primary skin diseases seen. Notes Wound exam oNailbed of the left great toe. This really looks a lot better than last week. Removing the tip of bone seems to have a lot of granulation. No debridement was required in this  area oArea on the left lateral foot had debris on the surface which I removed with a #3 curette. Hemostasis with direct pressure. Removing subcutaneous debris Electronic Signature(s) Signed: 02/05/2019 4:18:13 PM By: Linton Ham MD Entered By: Linton Ham on 02/05/2019 15:31:15 Dylan Robles ([id number]) -------------------------------------------------------------------------------- Physician Orders Details Patient Name: Dylan Robles, Dylan Robles. Date of Service: 02/05/2019 1:45 PM Medical Record Number: [id number] Patient Account Number: [id number] Date of Birth/Sex: [birth year] (79 y.o. M) Treating RN: Cornell Barman Primary Care Provider: Lucia Gaskins Other Clinician: Referring Provider: Lucia Gaskins Treating Provider/Extender: Tito Dine in Treatment: 3 Verbal / Phone Orders: No Diagnosis Coding Wound Cleansing Wound #1 Left,Dorsal Toe Great o Clean wound with Normal Saline. o May Shower, gently pat wound dry prior to applying new dressing. Wound #2 Left,Lateral Foot o Clean wound with Normal Saline. o May Shower, gently pat wound dry prior to applying new dressing. Anesthetic (add to Medication List) Wound #1 Left,Dorsal Toe Great o Topical Lidocaine 4% cream applied to wound bed prior to debridement (In Clinic Only). Wound #2 Left,Lateral Foot o Topical Lidocaine 4% cream applied to wound bed prior to debridement (In Clinic Only). Primary Wound Dressing Wound #1 Left,Dorsal Toe Great o Silver Alginate Secondary Dressing Wound #1 Left,Dorsal Toe Great o Gauze and Kerlix/Conform Wound #2 Left,Lateral Foot o Gauze and Kerlix/Conform Dressing Change Frequency Wound #1 Left,Dorsal Toe Great o Change dressing every day. Wound #2 Left,Lateral Foot o Change dressing every day. Follow-up Appointments Wound #1 Left,Dorsal Toe Great o Return Appointment in 1 week. Wound #2 Left,Lateral Foot o Return Appointment in 1 week. Edema  Control JHON, [name] ([id number]) Wound #1 Left,Dorsal Toe Great o Elevate legs to the level of the heart and pump ankles as often as possible Wound #2 Left,Lateral Foot o Elevate legs to the level of the heart and pump ankles as often as possible Medications-please add to medication list. Wound #1 Left,Dorsal Toe Great o  P.O. Antibiotics - Long term round of Cipro Wound #2 Left,Lateral Foot o P.O. Antibiotics - Long term round of Cipro Patient Medications Allergies: Plavix Notifications Medication Indication Start End Cipro Osteomyelitis left 02/05/2019 foot DOSE oral 500 mg tablet - 1 tablet oral bid for 14days Electronic Signature(s) Signed: 02/05/2019 3:34:32 PM By: Linton Ham MD Entered By: Linton Ham on 02/05/2019 15:34:31 [place], Dylan Robles ([id number]) -------------------------------------------------------------------------------- Problem List Details Patient Name: Dylan Robles, Dylan Robles. Date of Service: 02/05/2019 1:45 PM Medical Record Number: [id number] Patient Account Number: [id number] Date of Birth/Sex: July 26, 1940 (79 y.o. M) Treating RN: Cornell Barman Primary Care Provider: Lucia Gaskins Other Clinician: Referring Provider: Lucia Gaskins Treating Provider/Extender: Tito Dine in Treatment: 3 Active Problems ICD-10 Evaluated Encounter Code Description Active Date Today Diagnosis E11.621 Type 2 diabetes mellitus with foot ulcer 01/15/2019 No Yes M86.672 Other chronic osteomyelitis, left ankle and foot 02/05/2019 No Yes E11.51 Type 2 diabetes mellitus with diabetic peripheral angiopathy 01/15/2019 No Yes without gangrene L97.524 Non-pressure chronic ulcer of other part of left foot with 01/15/2019 No Yes necrosis of bone L97.528 Non-pressure chronic ulcer of other part of left foot with other 01/15/2019 No Yes specified severity E11.42 Type 2 diabetes mellitus with diabetic polyneuropathy 01/15/2019 No Yes Inactive Problems Resolved  Problems Electronic Signature(s) Signed: 02/05/2019 4:18:13 PM By: Linton Ham MD Entered By: Linton Ham on 02/05/2019 15:28:08 Dylan Robles ([id number]) -------------------------------------------------------------------------------- Progress Note Details Patient Name: Dylan Robles, Dylan Robles. Date of Service: 02/05/2019 1:45 PM Medical Record Number: [id number] Patient Account Number: [id number] Date of Birth/Sex: 1940-03-05 (79 y.o. M) Treating RN: Cornell Barman Primary Care Provider: Lucia Gaskins Other Clinician: Referring Provider: Lucia Gaskins Treating Provider/Extender: Tito Dine in Treatment: 3 Subjective History of Present Illness (HPI) ADMISSION 01/15/2019 Patient is a pleasant 79 year old type II diabetic man who arrives accompanied by his wife from [place]. His story begins in early September 2020. He developed purulence from the corner of his left great toe. He saw his podiatrist Dr. Sharyon Cable who diagnosed a severe chronic paronychia. He had total avulsion of the nail because of the paronychia today on 9/17. He was followed weekly by Dr. Sharyon Cable developed a nonhealing area on the nailbed of the left great toe although his pain was improved. On 10/20/2018 he was noted to have a new wound on the left lateral foot to which she is was applying Neosporin. His dressings were changed to peroxide to the nailbed and Silvadene on 10/27. On 12/7 it was noted that he had had previous Dopplers ordered by his primary care doctor in August. These were really quite poor. ABI on the right was 0.72, 0.44 TBI. On the left 0.54 ABI with a TBI of 0.20. Monophasic waveforms bilaterally. He was referred to Dr. Delana Meyer. On 12/15 he underwent an angiogram and had a stent placed in the left superficial femoral artery along with angioplasty. Since then his pain is a lot better. He is still using Silvadene to the wounds. He was prescribed doxycycline about 2 weeks ago which  he finished last week. Past medical history; type 2 diabetes, BPH, hypertension, coronary artery disease, hyperlipidemia and PAD ABIs were not obtainable in the clinic today. 1/13; type II diabetic man who has wounds on the left great toenail bed on his first toe and the left lateral foot. Both of these wounds accompanied by really significant PAD status post stenting of the left superficial femoral artery by Dr. Delana Meyer on 12/15. He is not describing claudication pain however  he seems to be limiting his activity. We have been using Santyl to both wound areas. X-ray of the left foot did not show any osseous erosions or periostitis to suggest osteomyelitis. 1/20; the area on his left lateral foot improved. Surface area smaller wound bed healthy we have been using Santyl. The real problem is the nail bed on the left great toe. Still exposed bone although better looking granulation tissue today. 1/27; patient has wounds on the left lateral foot and the nailbed of the left great toe. Bone that I sent for culture from the left great toe last time came back showing abundant Enterobacter Asburiae. We have been using Santyl Objective Constitutional Patient is hypotensive. However he feels well. Pulse regular and within target range for patient.[name] Respirations regular, non- labored and within target range.. Patient is mildly febrile today. appears in no distress. Vitals Time Taken: 2:08 PM, Height: 78 in, Weight: 206 lbs, BMI: 23.8, Temperature: 99.1 F, Pulse: 100 bpm, Respiratory Rate: 16 breaths/min, Blood Pressure: 97/59 mmHg. Dylan Robles, Dylan Robles ([id number]) Cardiovascular Pedal pulses are palpable on the left. General Notes: Wound exam Nailbed of the left great toe. This really looks a lot better than last week. Removing the tip of bone seems to have a lot of granulation. No debridement was required in this area Area on the left lateral foot had debris on the surface which I removed with a #3  curette. Hemostasis with direct pressure. Removing subcutaneous debris Integumentary (Hair, Skin) No primary skin diseases seen. Wound #1 status is Open. Original cause of wound was Gradually Appeared. The wound is located on the [employer]. The wound measures 0.9cm length x 0.5cm width x 0.2cm depth; 0.353cm^2 area and 0.071cm^3 volume. There is Fat Layer (Subcutaneous Tissue) Exposed exposed. There is no tunneling or undermining noted. There is a medium amount of serous drainage noted. The wound margin is flat and intact. There is small (1-33%) granulation within the wound bed. There is a large (67-100%) amount of necrotic tissue within the wound bed including Eschar and Adherent Slough. Wound #2 status is Open. Original cause of wound was Gradually Appeared. The wound is located on the Left,Lateral Foot. The wound measures 0.5cm length x 1cm width x 0.2cm depth; 0.393cm^2 area and 0.079cm^3 volume. There is Fat Layer (Subcutaneous Tissue) Exposed exposed. There is a medium amount of serous drainage noted. The wound margin is flat and intact. There is medium (34-66%) pale granulation within the wound bed. There is a medium (34-66%) amount of necrotic tissue within the wound bed including Adherent Slough. Assessment Active Problems ICD-10 Type 2 diabetes mellitus with foot ulcer Other chronic osteomyelitis, left ankle and foot Type 2 diabetes mellitus with diabetic peripheral angiopathy without gangrene Non-pressure chronic ulcer of other part of left foot with necrosis of bone Non-pressure chronic ulcer of other part of left foot with other specified severity Type 2 diabetes mellitus with diabetic polyneuropathy Procedures Wound #2 Pre-procedure diagnosis of Wound #2 is a Diabetic Wound/Ulcer of the Lower Extremity located on the Left,Lateral Foot .Severity of Tissue Pre Debridement is: Fat layer exposed. There was a Excisional Skin/Subcutaneous Tissue Debridement with a total  area of 0.5 sq cm performed by Ricard Dillon, MD. With the following instrument(s): Curette to remove Viable and Non-Viable tissue/material. Material removed includes Subcutaneous Tissue and Slough and after achieving pain control using Lidocaine. No specimens were taken. A time out was conducted at 14:20, prior to the start of the procedure. A Minimum amount of  bleeding was controlled with Pressure. The procedure was tolerated well. Post Debridement Measurements: 0.5cm length x 1cm width x 0.3cm depth; 0.118cm^3 volume. Character of Wound/Ulcer Post Debridement is stable. Severity of Tissue Post Debridement is: Fat layer exposed. Post procedure Diagnosis Wound #2: Same as Pre-Procedure Dylan Robles, Dylan Robles ([id number]) Plan Wound Cleansing: Wound #1 Left,Dorsal Toe Great: Clean wound with Normal Saline. May Shower, gently pat wound dry prior to applying new dressing. Wound #2 Left,Lateral Foot: Clean wound with Normal Saline. May Shower, gently pat wound dry prior to applying new dressing. Anesthetic (add to Medication List): Wound #1 Left,Dorsal Toe Great: Topical Lidocaine 4% cream applied to wound bed prior to debridement (In Clinic Only). Wound #2 Left,Lateral Foot: Topical Lidocaine 4% cream applied to wound bed prior to debridement (In Clinic Only). Primary Wound Dressing: Wound #1 Left,Dorsal Toe Great: Silver Alginate Secondary Dressing: Wound #1 Left,Dorsal Toe Great: Gauze and Kerlix/Conform Wound #2 Left,Lateral Foot: Gauze and Kerlix/Conform Dressing Change Frequency: Wound #1 Left,Dorsal Toe Great: Change dressing every day. Wound #2 Left,Lateral Foot: Change dressing every day. Follow-up Appointments: Wound #1 Left,Dorsal Toe Great: Return Appointment in 1 week. Wound #2 Left,Lateral Foot: Return Appointment in 1 week. Edema Control: Wound #1 Left,Dorsal Toe Great: Elevate legs to the level of the heart and pump ankles as often as possible Wound #2 Left,Lateral  Foot: Elevate legs to the level of the heart and pump ankles as often as possible Medications-please add to medication list.: Wound #1 Left,Dorsal Toe Great: P.O. Antibiotics - Long term round of Cipro Wound #2 Left,Lateral Foot: P.O. Antibiotics - Long term round of Cipro The following medication(s) was prescribed: Cipro oral 500 mg tablet 1 tablet oral bid for 14days for Osteomyelitis left foot starting 02/05/2019 1. Osteomyelitis of the wound on the left great toe and the nail matrix. Bone culture showed Enterobacter. I am going to use ciprofloxacin 500 twice daily because of good bone penetration. Monitored for side effects 2. Debridement of the left lateral foot wound. I think this also looks somewhat better. 3. I am changing the primary dressing on both sides to silver alginate. Moisture around the wound bed Dylan Robles, Dylan Robles ([id number]) Electronic Signature(s) Signed: 02/05/2019 4:18:13 PM By: Linton Ham MD Entered By: Linton Ham on 02/05/2019 15:35:36 Dylan Robles, Dylan Robles ([id number]) -------------------------------------------------------------------------------- [employer] Details Patient Name: Dylan Robles, Dylan Robles. Date of Service: 02/05/2019 Medical Record Number: [id number] Patient Account Number: [id number] Date of Birth/Sex: [birth year] (79 y.o. M) Treating RN: Cornell Barman Primary Care Provider: Lucia Gaskins Other Clinician: Referring Provider: Lucia Gaskins Treating Provider/Extender: Tito Dine in Treatment: 3 Diagnosis Coding ICD-10 Codes Code Description E11.621 Type 2 diabetes mellitus with foot ulcer M86.672 Other chronic osteomyelitis, left ankle and foot E11.51 Type 2 diabetes mellitus with diabetic peripheral angiopathy without gangrene L97.524 Non-pressure chronic ulcer of other part of left foot with necrosis of bone L97.528 Non-pressure chronic ulcer of other part of left foot with other specified severity E11.42 Type 2 diabetes mellitus with  diabetic polyneuropathy Facility Procedures CPT4 Code Description: [id number] - DEB SUBQ TISSUE 20 SQ CM/< ICD-10 Diagnosis Description E11.621 Type 2 diabetes mellitus with foot ulcer L97.528 Non-pressure chronic ulcer of other part of left foot with other Modifier: specified sever Quantity: 1 ity Physician Procedures CPT4 Code Description: [id number] - WC PHYS SUBQ TISS 20 SQ CM ICD-10 Diagnosis Description E11.621 Type 2 diabetes mellitus with foot ulcer L97.528 Non-pressure chronic ulcer of other part of left foot with other Modifier: specified severi Quantity: 1  ty Electronic Signature(s) Signed: 02/05/2019 4:18:13 PM By: Linton Ham MD Entered By: Linton Ham on 02/05/2019 15:36:12

## 2019-03-03 ENCOUNTER — Other Ambulatory Visit: Payer: Self-pay

## 2019-03-03 ENCOUNTER — Ambulatory Visit (INDEPENDENT_AMBULATORY_CARE_PROVIDER_SITE_OTHER): Payer: Medicare HMO | Admitting: Vascular Surgery

## 2019-03-03 ENCOUNTER — Ambulatory Visit (INDEPENDENT_AMBULATORY_CARE_PROVIDER_SITE_OTHER): Payer: Medicare HMO

## 2019-03-03 ENCOUNTER — Encounter (INDEPENDENT_AMBULATORY_CARE_PROVIDER_SITE_OTHER): Payer: Self-pay | Admitting: Vascular Surgery

## 2019-03-03 VITALS — BP 144/75 | HR 87 | Resp 12 | Ht 78.0 in | Wt 214.0 lb

## 2019-03-03 DIAGNOSIS — L97529 Non-pressure chronic ulcer of other part of left foot with unspecified severity: Secondary | ICD-10-CM

## 2019-03-03 DIAGNOSIS — E782 Mixed hyperlipidemia: Secondary | ICD-10-CM

## 2019-03-03 DIAGNOSIS — I10 Essential (primary) hypertension: Secondary | ICD-10-CM | POA: Diagnosis not present

## 2019-03-03 DIAGNOSIS — I25118 Atherosclerotic heart disease of native coronary artery with other forms of angina pectoris: Secondary | ICD-10-CM | POA: Diagnosis not present

## 2019-03-03 DIAGNOSIS — I70213 Atherosclerosis of native arteries of extremities with intermittent claudication, bilateral legs: Secondary | ICD-10-CM | POA: Diagnosis not present

## 2019-03-03 DIAGNOSIS — E1169 Type 2 diabetes mellitus with other specified complication: Secondary | ICD-10-CM

## 2019-03-03 DIAGNOSIS — E785 Hyperlipidemia, unspecified: Secondary | ICD-10-CM

## 2019-03-05 ENCOUNTER — Encounter: Payer: Medicare HMO | Admitting: Internal Medicine

## 2019-03-05 ENCOUNTER — Other Ambulatory Visit: Payer: Self-pay

## 2019-03-05 DIAGNOSIS — E11621 Type 2 diabetes mellitus with foot ulcer: Secondary | ICD-10-CM | POA: Diagnosis not present

## 2019-03-07 ENCOUNTER — Encounter (INDEPENDENT_AMBULATORY_CARE_PROVIDER_SITE_OTHER): Payer: Self-pay | Admitting: Vascular Surgery

## 2019-03-07 NOTE — Progress Notes (Signed)
MRN : [id number]  Dylan Robles is a 79 y.o. (11-13-1940) male who presents with chief complaint of  Chief Complaint  Patient presents with  . Follow-up    6 wk LLE Artierial Duplex ABI  .  History of Present Illness:   Patient is here for follow-up noninvasive studies after angiogram on 12/24/2018 due to lower extremity ulceration.  The patient underwent angioplasty of his posterior tibial artery and anterior tibial artery..  The patient most recently began seeing wound care as well.  There is some report that the patient's wound is healing however slowly.  He does endorse that the pain he was having in his lower extremities is greatly decreased.  There have been no fever, chills, nausea vomiting or diarrhea following his procedure.  ABI's Rt=0.73 and Lt=1.56  (previous ABI Rt=0.67 and Lt=0.79)  Left lower extremity arterial duplex reveals biphasic/triphasic waveforms down to the level of the tibial arteries (stent is patent) where it transitions to monophasic flow throughout the calf vessels.  Current Meds  Medication Sig  . amLODipine (NORVASC) 5 MG tablet Take 5 mg by mouth daily.   [name] aspirin EC 81 MG tablet Take 81 mg by mouth daily.   [name] atorvastatin (LIPITOR) 80 MG tablet Take 80 mg by mouth at bedtime.   [name] BRILINTA 60 MG TABS tablet Take 60 mg by mouth 2 (two) times daily.   . ciprofloxacin (CIPRO) 500 MG tablet   . doxycycline (VIBRA-TABS) 100 MG tablet Take 1 tablet (100 mg total) by mouth 2 (two) times daily.  [name] gabapentin (NEURONTIN) 300 MG capsule Take 300 mg by mouth 2 (two) times daily.   . insulin aspart (NOVOLOG) 100 UNIT/ML injection Inject 5 Units into the skin 3 (three) times daily before meals.   . isosorbide mononitrate (IMDUR) 30 MG 24 hr tablet Take 30 mg by mouth daily.  [name] levothyroxine (SYNTHROID, LEVOTHROID) 88 MCG tablet Take 1 tablet (88 mcg total) by mouth daily.  [name] lisinopril (ZESTRIL) 10 MG tablet Take 10 mg by mouth daily.   . metFORMIN (GLUCOPHAGE)  1000 MG tablet Take 1 tablet (1,000 mg total) by mouth 2 (two) times daily.  . metoprolol succinate (TOPROL-XL) 50 MG 24 hr tablet Take 50 mg by mouth daily.   . tamsulosin (FLOMAX) 0.4 MG CAPS capsule Take 0.4 mg by mouth at bedtime.  . TRESIBA FLEXTOUCH 200 UNIT/ML SOPN     Past Medical History:  Diagnosis Date  . Coronary artery disease    hyperlipidemia  . Diabetes mellitus   . Diabetes mellitus without complication ([place])   . Hypercholesterolemia   . Hypertension   . Hypothyroidism   . Shortness of breath dyspnea    with exertion  . Vasovagal syncope     Past Surgical History:  Procedure Laterality Date  . APPENDECTOMY    . CARDIAC CATHETERIZATION N/A 06/16/2014   Procedure: Left Heart Cath;  Surgeon: Dionisio David, MD;  Location: [facility];  Service: Cardiovascular;  Laterality: N/A;  . CIRCUMCISION, NON-NEWBORN    . HERNIA REPAIR    . LOWER EXTREMITY ANGIOGRAPHY Left 12/24/2018   Procedure: LOWER EXTREMITY ANGIOGRAPHY;  Surgeon: Katha Cabal, MD;  Location: [facility];  Service: Cardiovascular;  Laterality: Left;  . THYROID SURGERY      Social History Social History   Tobacco Use  . Smoking status: Former Research scientist (life sciences)  . Smokeless tobacco: Never Used  Substance Use Topics  . Alcohol use: No  .  Drug use: No    Family History Family History  Problem Relation Age of Onset  . Diabetes Father   . Hypertension Father   . Cancer Mother   . Diabetes Brother     Allergies  Allergen Reactions  . Plavix [Clopidogrel] Hives     REVIEW OF SYSTEMS (Negative unless checked)  Constitutional: [] Weight loss  [] Fever  [] Chills Cardiac: [] Chest pain   [] Chest pressure   [] Palpitations   [] Shortness of breath when laying flat   [] Shortness of breath with exertion. Vascular:  [x] Pain in legs with walking   [] Pain in legs at rest  [] History of DVT   [] Phlebitis   [] Swelling in legs   [] Varicose veins   [] Non-healing ulcers Pulmonary:   [] Uses home  oxygen   [] Productive cough   [] Hemoptysis   [] Wheeze  [] COPD   [] Asthma Neurologic:  [] Dizziness   [] Seizures   [] History of stroke   [] History of TIA  [] Aphasia   [] Vissual changes   [] Weakness or numbness in arm   [] Weakness or numbness in leg Musculoskeletal:   [] Joint swelling   [] Joint pain   [] Low back pain Hematologic:  [] Easy bruising  [] Easy bleeding   [] Hypercoagulable state   [] Anemic Gastrointestinal:  [] Diarrhea   [] Vomiting  [] Gastroesophageal reflux/heartburn   [] Difficulty swallowing. Genitourinary:  [] Chronic kidney disease   [] Difficult urination  [] Frequent urination   [] Blood in urine Skin:  [] Rashes   [] Ulcers  Psychological:  [] History of anxiety   []  History of major depression.  Physical Examination  Vitals:   03/03/19 1350  BP: (!) 144/75  Pulse: 87  Resp: 12  Weight: 214 lb (97.1 kg)  Height: 6\' 6"  (1.981 m)   Body mass index is 24.73 kg/m. Gen: WD/WN, NAD Head: [place]/AT, No temporalis wasting.  Ear/Nose/Throat: Hearing grossly intact, nares w/o erythema or drainage Eyes: PER, EOMI, sclera nonicteric.  Neck: Supple, no large masses.   Pulmonary:  Good air movement, no audible wheezing bilaterally, no use of accessory muscles.  Cardiac: RRR, no JVD Vascular:  Vessel Right Left  Radial Palpable Palpable  PT Not Palpable Not Palpable  DP Not Palpable Not Palpable  Gastrointestinal: Non-distended. No guarding/no peritoneal signs.  Musculoskeletal: M/S 5/5 throughout.  No deformity or atrophy.  Neurologic: CN 2-12 intact. Symmetrical.  Speech is fluent. Motor exam as listed above. Psychiatric: Judgment intact, Mood & affect appropriate for pt's clinical situation. Dermatologic: No rashes or ulcers noted.  No changes consistent with cellulitis.   CBC Lab Results  Component Value Date   WBC 6.2 09/21/2017   HGB 10.6 (L) 09/21/2017   HCT 32.5 (L) 09/21/2017   MCV 90.3 09/21/2017   PLT 232 09/21/2017    BMET    Component Value Date/Time   NA 137  09/21/2017 0535   K 3.8 09/21/2017 0535   CL 106 09/21/2017 0535   CO2 25 09/21/2017 0535   GLUCOSE 165 (H) 09/21/2017 0535   BUN 15 12/24/2018 1246   CREATININE 1.21 12/24/2018 1246   CALCIUM 8.5 (L) 09/21/2017 0535   GFRNONAA 56 (L) 12/24/2018 1246   GFRAA >60 12/24/2018 1246   CrCl cannot be calculated (Patient's most recent lab result is older than the maximum 21 days allowed.).  COAG No results found for: INR, PROTIME  Radiology VAS [place] ABI WITH/WO TBI  Result Date: 03/03/2019 LOWER EXTREMITY DOPPLER STUDY Indications: Peripheral artery disease.  Vascular Interventions: 12/24/2018 Lt pta ata. Comparison Study: 01/17/2019 Performing Technologist: Concha Norway RVT  Examination Guidelines: A  complete evaluation includes at minimum, Doppler waveform signals and systolic blood pressure reading at the level of bilateral brachial, anterior tibial, and posterior tibial arteries, when vessel segments are accessible. Bilateral testing is considered an integral part of a complete examination. Photoelectric Plethysmograph (PPG) waveforms and toe systolic pressure readings are included as required and additional duplex testing as needed. Limited examinations for reoccurring indications may be performed as noted.  ABI Findings: +---------+------------------+-----+----------+--------+ Right    Rt Pressure (mmHg)IndexWaveform  Comment  +---------+------------------+-----+----------+--------+ Brachial 140                                       +---------+------------------+-----+----------+--------+ ATA      102               0.73 biphasic           +---------+------------------+-----+----------+--------+ PTA      70                0.50 monophasic         +---------+------------------+-----+----------+--------+ Great Toe86                0.61 Abnormal           +---------+------------------+-----+----------+--------+ +---------+------------------+-----+----------+-------+ Left      Lt Pressure (mmHg)IndexWaveform  Comment +---------+------------------+-----+----------+-------+ Brachial 140                                      +---------+------------------+-----+----------+-------+ ATA      219               1.56 biphasic          +---------+------------------+-----+----------+-------+ PTA      184               1.31 monophasic        +---------+------------------+-----+----------+-------+ DP       114               0.81                   +---------+------------------+-----+----------+-------+ Great Toe                       Normal            +---------+------------------+-----+----------+-------+ +-------+-----------+-----------+------------+------------+ ABI/TBIToday's ABIToday's TBIPrevious ABIPrevious TBI +-------+-----------+-----------+------------+------------+ Right  .73        .61        .67         .35          +-------+-----------+-----------+------------+------------+ Left   1.56       .81        .79         .54          +-------+-----------+-----------+------------+------------+ Left ABIs and TBIs appear increased compared to prior study on 01/17/2019.  Summary: Right: Resting right ankle-brachial index indicates moderate right lower extremity arterial disease. The right toe-brachial index is abnormal. Left: Resting left ankle-brachial index is within normal range. No evidence of significant left lower extremity arterial disease. The left toe-brachial index is normal.  *See table(s) above for measurements and observations.  Electronically signed by Hortencia Pilar MD on 03/03/2019 at 1:48:43 PM.    Final     Assessment/Plan 1. Atherosclerosis of native artery of both lower extremities with intermittent claudication ([place])  Recommend:  The patient has evidence of atherosclerosis of the lower extremities with claudication.  The patient does not voice lifestyle limiting changes at this point in time.  Noninvasive studies do not  suggest clinically significant change.  No invasive studies, angiography or surgery at this time The patient should continue walking and begin a more formal exercise program.  The patient should continue antiplatelet therapy and aggressive treatment of the lipid abnormalities  No changes in the patient's medications at this time  The patient should continue wearing graduated compression socks 10-15 mmHg strength to control the mild edema.   - VAS [place] LOWER EXTREMITY ARTERIAL DUPLEX; Future - VAS [place] ABI WITH/WO TBI; Future  2. Coronary artery disease of native artery of native heart with stable angina pectoris (HCC) Continue cardiac and antihypertensive medications as already ordered and reviewed, no changes at this time.  Continue statin as ordered and reviewed, no changes at this time  Nitrates PRN for chest pain   3. Essential hypertension Continue antihypertensive medications as already ordered, these medications have been reviewed and there are no changes at this time.   4. Type 2 diabetes mellitus with hyperlipidemia ([place]) Continue hypoglycemic medications as already ordered, these medications have been reviewed and there are no changes at this time.  Hgb A1C to be monitored as already arranged by primary service   5. Mixed hyperlipidemia Continue statin as ordered and reviewed, no changes at this time      Hortencia Pilar, MD  03/07/2019 10:17 AM

## 2019-03-14 NOTE — Progress Notes (Signed)
SEVAK, CHENNAULT ([id number]) Visit Report for 03/05/2019 Arrival Information Details Patient Name: Dylan Robles, Dylan Robles. Date of Service: 03/05/2019 10:45 AM Medical Record Number: [id number] Patient Account Number: [id number] Date of Birth/Sex: 1940-02-10 (79 y.o. M) Treating RN: Army Melia Primary Care Miana Politte: Lucia Gaskins Other Clinician: Referring Aybree Lanyon: Lucia Gaskins Treating Curley Fayette/Extender: Tito Dine in Treatment: 7 Visit Information History Since Last Visit Added or deleted any medications: No Patient Arrived: Cane Any new allergies or adverse reactions: No Arrival Time: 10:44 Had a fall or experienced change in No Accompanied By: wife activities of daily living that may affect Transfer Assistance: None risk of falls: Patient Identification Verified: Yes Signs or symptoms of abuse/neglect since last visito No Patient Has Alerts: Yes Hospitalized since last visit: No Patient Alerts: ABI L.54 R.72 08/2018 Has Dressing in Place as Prescribed: Yes Pain Present Now: No Electronic Signature(s) Signed: 03/05/2019 4:52:55 PM By: Army Melia Entered By: Army Melia on 03/05/2019 10:45:04 Dylan Robles ([id number]) -------------------------------------------------------------------------------- Clinic Level of Care Assessment Details Patient Name: Dylan Robles Date of Service: 03/05/2019 10:45 AM Medical Record Number: [id number] Patient Account Number: [id number] Date of Birth/Sex: [birth year] (79 y.o. M) Treating RN: Cornell Barman Primary Care Chantille Navarrete: Lucia Gaskins Other Clinician: Referring Karan Ramnauth: Lucia Gaskins Treating Paiden Caraveo/Extender: Tito Dine in Treatment: 7 Clinic Level of Care Assessment Items TOOL 4 Quantity Score []  - Use when only an EandM is performed on FOLLOW-UP visit 0 ASSESSMENTS - Nursing Assessment / Reassessment X - Reassessment of Co-morbidities (includes updates in patient status) 1 10 X- 1  5 Reassessment of Adherence to Treatment Plan ASSESSMENTS - Wound and Skin Assessment / Reassessment X - Simple Wound Assessment / Reassessment - one wound 1 5 []  - 0 Complex Wound Assessment / Reassessment - multiple wounds []  - 0 Dermatologic / Skin Assessment (not related to wound area) ASSESSMENTS - Focused Assessment []  - Circumferential Edema Measurements - multi extremities 0 []  - 0 Nutritional Assessment / Counseling / Intervention []  - 0 Lower Extremity Assessment (monofilament, tuning fork, pulses) []  - 0 Peripheral Arterial Disease Assessment (using hand held doppler) ASSESSMENTS - Ostomy and/or Continence Assessment and Care []  - Incontinence Assessment and Management 0 []  - 0 Ostomy Care Assessment and Management (repouching, etc.) PROCESS - Coordination of Care X - Simple Patient / Family Education for ongoing care 1 15 []  - 0 Complex (extensive) Patient / Family Education for ongoing care []  - 0 Staff obtains Programmer, systems, Records, Test Results / Process Orders []  - 0 Staff telephones HHA, Nursing Homes / Clarify orders / etc []  - 0 Routine Transfer to another Facility (non-emergent condition) []  - 0 Routine Hospital Admission (non-emergent condition) []  - 0 New Admissions / Biomedical engineer / Ordering NPWT, Apligraf, etc. []  - 0 Emergency Hospital Admission (emergent condition) X- 1 10 Simple Discharge Coordination []  - 0 Complex (extensive) Discharge Coordination PROCESS - Special Needs []  - Pediatric / Minor Patient Management 0 []  - 0 Isolation Patient Management []  - 0 Hearing / Language / Visual special needs []  - 0 Assessment of Community assistance (transportation, D/C planning, etc.) Dylan Robles, Dylan Robles ([id number]) []  - 0 Additional assistance / Altered mentation []  - 0 Support Surface(s) Assessment (bed, cushion, seat, etc.) INTERVENTIONS - Wound Cleansing / Measurement []  - Simple Wound Cleansing - one wound 0 []  - 0 Complex Wound  Cleansing - multiple wounds []  - 0 Wound Imaging (photographs - any number of wounds) []  - 0 Wound Tracing (instead of photographs) []  -  0 Simple Wound Measurement - one wound []  - 0 Complex Wound Measurement - multiple wounds INTERVENTIONS - Wound Dressings []  - Small Wound Dressing one or multiple wounds 0 []  - 0 Medium Wound Dressing one or multiple wounds []  - 0 Large Wound Dressing one or multiple wounds []  - 0 Application of Medications - topical []  - 0 Application of Medications - injection INTERVENTIONS - Miscellaneous []  - External ear exam 0 []  - 0 Specimen Collection (cultures, biopsies, blood, body fluids, etc.) []  - 0 Specimen(s) / Culture(s) sent or taken to Lab for analysis []  - 0 Patient Transfer (multiple staff / Civil Service fast streamer / Similar devices) []  - 0 Simple Staple / Suture removal (25 or less) []  - 0 Complex Staple / Suture removal (26 or more) []  - 0 Hypo / Hyperglycemic Management (close monitor of Blood Glucose) []  - 0 Ankle / Brachial Index (ABI) - do not check if billed separately X- 1 5 Vital Signs Has the patient been seen at the hospital within the last three years: Yes Total Score: 50 Level Of Care: New/Established - Level 2 Electronic Signature(s) Signed: 03/14/2019 5:40:44 PM By: Gretta Cool, BSN, RN, CWS, Kim RN, BSN Entered By: Gretta Cool, BSN, RN, CWS, Kim on 03/05/2019 11:14:55 Dylan Robles ([id number]) -------------------------------------------------------------------------------- Encounter Discharge Information Details Patient Name: Dylan Robles, [name] Date of Service: 03/05/2019 10:45 AM Medical Record Number: [id number] Patient Account Number: [id number] Date of Birth/Sex: December 22, 1940 (79 y.o. M) Treating RN: Cornell Barman Primary Care Shourya Macpherson: Lucia Gaskins Other Clinician: Referring Rashed Edler: Lucia Gaskins Treating Eira Alpert/Extender: Tito Dine in Treatment: 7 Encounter Discharge Information Items Discharge Condition:  Stable Ambulatory Status: Cane Discharge Destination: Home Transportation: Ambulance Accompanied By: caregiver Schedule Follow-up Appointment: No Clinical Summary of Care: Electronic Signature(s) Signed: 03/14/2019 5:40:44 PM By: Gretta Cool, BSN, RN, CWS, Kim RN, BSN Entered By: Gretta Cool, BSN, RN, CWS, Kim on 03/05/2019 11:16:50 Dylan Robles ([id number]) -------------------------------------------------------------------------------- Lower Extremity Assessment Details Patient Name: Dylan Robles, POMRENKE. Date of Service: 03/05/2019 10:45 AM Medical Record Number: [id number] Patient Account Number: [id number] Date of Birth/Sex: 05-11-1940 (79 y.o. M) Treating RN: Army Melia Primary Care Andriel Omalley: Lucia Gaskins Other Clinician: Referring Kenith Trickel: Lucia Gaskins Treating Joss Friedel/Extender: Ricard Dillon Weeks in Treatment: 7 Edema Assessment Assessed: [Left: No] [Right: No] Edema: [Left: N] [Right: o] Vascular Assessment Pulses: Dorsalis Pedis Palpable: [Left:Yes] Electronic Signature(s) Signed: 03/05/2019 4:52:55 PM By: Army Melia Entered By: Army Melia on 03/05/2019 10:53:36 Bossard, Charlotta Newton ([id number]) -------------------------------------------------------------------------------- Multi Wound Chart Details Patient Name: Dylan Robles, PARZIALE. Date of Service: 03/05/2019 10:45 AM Medical Record Number: [id number] Patient Account Number: [id number] Date of Birth/Sex: [birth year] (79 y.o. M) Treating RN: Cornell Barman Primary Care Willer Osorno: Lucia Gaskins Other Clinician: Referring Favio Moder: Lucia Gaskins Treating Carston Riedl/Extender: Tito Dine in Treatment: 7 Vital Signs Height(in): 78 Pulse(bpm): 76 Weight(lbs): 206 Blood Pressure(mmHg): 152/80 Body Mass Index(BMI): 24 Temperature(F): 98.3 Respiratory Rate(breaths/min): 16 Photos: [N/A:N/A] Wound Location: Left, Dorsal Toe Great Left, Lateral Foot N/A Wounding Event: Gradually Appeared Gradually Appeared  N/A Primary Etiology: Diabetic Wound/Ulcer of the Lower Diabetic Wound/Ulcer of the Lower N/A Extremity Extremity Comorbid History: Congestive Heart Failure, Congestive Heart Failure, N/A Hypertension, Type II Diabetes Hypertension, Type II Diabetes Date Acquired: 07/10/2018 08/10/2018 N/A Weeks of Treatment: 7 7 N/A Wound Status: Healed - Epithelialized Healed - Epithelialized N/A Measurements L x W x D (cm) 0x0x0 0x0x0 N/A Area (cm) : 0 0 N/A Volume (cm) : 0 0 N/A % Reduction in Area: 100.00% 100.00% N/A % Reduction  in Volume: 100.00% 100.00% N/A Classification: Grade 2 Grade 2 N/A Exudate Amount: None Present None Present N/A Wound Margin: Flat and Intact Flat and Intact N/A Granulation Amount: None Present (0%) None Present (0%) N/A Necrotic Amount: Large (67-100%) Medium (34-66%) N/A Necrotic Tissue: Eschar Eschar N/A Exposed Structures: Fascia: No Fascia: No N/A Fat Layer (Subcutaneous Tissue) Fat Layer (Subcutaneous Tissue) Exposed: No Exposed: No Tendon: No Tendon: No Muscle: No Muscle: No Joint: No Joint: No Bone: No Bone: No Epithelialization: Large (67-100%) Large (67-100%) N/A Treatment Notes Electronic Signature(s) Signed: 03/14/2019 5:40:44 PM By: Gretta Cool, BSN, RN, CWS, Kim RN, BSN Entered By: Gretta Cool, BSN, RN, CWS, Kim on 03/05/2019 11:14:13 Dylan Robles ([id number]) -------------------------------------------------------------------------------- Multi-Disciplinary Care Plan Details Patient Name: Dylan Robles, COLLA. Date of Service: 03/05/2019 10:45 AM Medical Record Number: [id number] Patient Account Number: [id number] Date of Birth/Sex: [birth year] (79 y.o. M) Treating RN: Cornell Barman Primary Care Gaelyn Tukes: Lucia Gaskins Other Clinician: Referring Emberlee Sortino: Lucia Gaskins Treating Olivya Sobol/Extender: Tito Dine in Treatment: 7 Active Inactive Electronic Signature(s) Signed: 03/14/2019 5:40:44 PM By: Gretta Cool, BSN, RN, CWS, Kim RN, BSN Entered By:  Gretta Cool, BSN, RN, CWS, Kim on 03/05/2019 11:13:56 Dylan Robles ([id number]) -------------------------------------------------------------------------------- Pain Assessment Details Patient Name: Dylan Robles, CALLISTE. Date of Service: 03/05/2019 10:45 AM Medical Record Number: [id number] Patient Account Number: [id number] Date of Birth/Sex: 07-06-1940 (79 y.o. M) Treating RN: Army Melia Primary Care Hever Castilleja: Lucia Gaskins Other Clinician: Referring Basia Mcginty: Lucia Gaskins Treating Quency Tober/Extender: Tito Dine in Treatment: 7 Active Problems Location of Pain Severity and Description of Pain Patient Has Paino No Site Locations Pain Management and Medication Current Pain Management: Electronic Signature(s) Signed: 03/05/2019 4:52:55 PM By: Army Melia Entered By: Army Melia on 03/05/2019 10:49:11 Dylan Robles ([id number]) -------------------------------------------------------------------------------- Wound Assessment Details Patient Name: Dylan Robles, SASSAMAN. Date of Service: 03/05/2019 10:45 AM Medical Record Number: [id number] Patient Account Number: [id number] Date of Birth/Sex: May 04, 1940 (79 y.o. M) Treating RN: Cornell Barman Primary Care Giann Obara: Lucia Gaskins Other Clinician: Referring Ran Tullis: Lucia Gaskins Treating Asheley Hellberg/Extender: Tito Dine in Treatment: 7 Wound Status Wound Number: 1 Primary Etiology: Diabetic Wound/Ulcer of the Lower Extremity Wound Location: Left, Dorsal Toe Great Wound Status: Healed - Epithelialized Wounding Event: Gradually Appeared Comorbid Congestive Heart Failure, Hypertension, Type II History: Diabetes Date Acquired: 07/10/2018 Weeks Of Treatment: 7 Clustered Wound: No Photos Wound Measurements Length: (cm) 0 Width: (cm) 0 Depth: (cm) 0 Area: (cm) 0 Volume: (cm) 0 % Reduction in Area: 100% % Reduction in Volume: 100% Epithelialization: Large (67-100%) Wound Description Classification: Grade  2 Wound Margin: Flat and Intact Exudate Amount: None Present Foul Odor After Cleansing: No Slough/Fibrino Yes Wound Bed Granulation Amount: None Present (0%) Exposed Structure Necrotic Amount: Large (67-100%) Fascia Exposed: No Necrotic Quality: Eschar Fat Layer (Subcutaneous Tissue) Exposed: No Tendon Exposed: No Muscle Exposed: No Joint Exposed: No Bone Exposed: No Electronic Signature(s) Signed: 03/14/2019 5:40:44 PM By: Gretta Cool, BSN, RN, CWS, Kim RN, BSN Entered By: Gretta Cool, BSN, RN, CWS, Kim on 03/05/2019 11:13:20 Dylan Robles ([id number]) -------------------------------------------------------------------------------- Wound Assessment Details Patient Name: Dylan Robles, SEPPALA. Date of Service: 03/05/2019 10:45 AM Medical Record Number: [id number] Patient Account Number: [id number] Date of Birth/Sex: 06/08/1940 (79 y.o. M) Treating RN: Cornell Barman Primary Care Cuauhtemoc Huegel: Lucia Gaskins Other Clinician: Referring Joeli Fenner: Lucia Gaskins Treating Sherylann Vangorden/Extender: Tito Dine in Treatment: 7 Wound Status Wound Number: 2 Primary Etiology: Diabetic Wound/Ulcer of the Lower Extremity Wound Location: Left, Lateral Foot Wound Status: Healed - Epithelialized Wounding Event:  Gradually Appeared Comorbid Congestive Heart Failure, Hypertension, Type II History: Diabetes Date Acquired: 08/10/2018 Weeks Of Treatment: 7 Clustered Wound: No Photos Wound Measurements Length: (cm) 0 Width: (cm) 0 Depth: (cm) 0 Area: (cm) 0 Volume: (cm) 0 % Reduction in Area: 100% % Reduction in Volume: 100% Epithelialization: Large (67-100%) Tunneling: No Undermining: No Wound Description Classification: Grade 2 Wound Margin: Flat and Intact Exudate Amount: None Present Foul Odor After Cleansing: No Slough/Fibrino Yes Wound Bed Granulation Amount: None Present (0%) Exposed Structure Necrotic Amount: Medium (34-66%) Fascia Exposed: No Necrotic Quality: Eschar Fat Layer  (Subcutaneous Tissue) Exposed: No Tendon Exposed: No Muscle Exposed: No Joint Exposed: No Bone Exposed: No Electronic Signature(s) Signed: 03/14/2019 5:40:44 PM By: Gretta Cool, BSN, RN, CWS, Kim RN, BSN Entered By: Gretta Cool, BSN, RN, CWS, Kim on 03/05/2019 11:13:25 Dylan Robles ([id number]) -------------------------------------------------------------------------------- [place] Details Patient Name: Dylan Robles, CROUSE. Date of Service: 03/05/2019 10:45 AM Medical Record Number: [id number] Patient Account Number: [id number] Date of Birth/Sex: 1940-04-30 (79 y.o. M) Treating RN: Army Melia Primary Care Binyomin Brann: Lucia Gaskins Other Clinician: Referring Stephen Turnbaugh: Lucia Gaskins Treating Shyane Fossum/Extender: Tito Dine in Treatment: 7 Vital Signs Time Taken: 10:45 Temperature (F): 98.3 Height (in): 78 Pulse (bpm): 88 Weight (lbs): 206 Respiratory Rate (breaths/min): 16 Body Mass Index (BMI): 23.8 Blood Pressure (mmHg): 152/80 Reference Range: 80 - 120 mg / dl Electronic Signature(s) Signed: 03/05/2019 4:52:55 PM By: Army Melia Entered By: Army Melia on 03/05/2019 10:49:06

## 2019-03-14 NOTE — Progress Notes (Signed)
Dylan Robles, Dylan Robles ([id number]) Visit Report for 03/05/2019 HPI Details Patient Name: Dylan Robles, Dylan Robles. Date of Service: 03/05/2019 10:45 AM Medical Record Number: [id number] Patient Account Number: [id number] Date of Birth/Sex: 1940-04-29 (79 y.o. M) Treating RN: Cornell Barman Primary Care Provider: Lucia Gaskins Other Clinician: Referring Provider: Lucia Gaskins Treating Provider/Extender: Tito Dine in Treatment: 7 History of Present Illness HPI Description: ADMISSION 01/15/2019 Patient is a pleasant 79 year old type II diabetic man who arrives accompanied by his wife from [place]. His story begins in early September 2020. He developed purulence from the corner of his left great toe. He saw his podiatrist Dr. Sharyon Cable who diagnosed a severe chronic paronychia. He had total avulsion of the nail because of the paronychia today on 9/17. He was followed weekly by Dr. Sharyon Cable developed a nonhealing area on the nailbed of the left great toe although his pain was improved. On 10/20/2018 he was noted to have a new wound on the left lateral foot to which she is was applying Neosporin. His dressings were changed to peroxide to the nailbed and Silvadene on 10/27. On 12/7 it was noted that he had had previous Dopplers ordered by his primary care doctor in August. These were really quite poor. ABI on the right was 0.72, 0.44 TBI. On the left 0.54 ABI with a TBI of 0.20. Monophasic waveforms bilaterally. He was referred to Dr. Delana Meyer. On 12/15 he underwent an angiogram and had a stent placed in the left superficial femoral artery along with angioplasty. Since then his pain is a lot better. He is still using Silvadene to the wounds. He was prescribed doxycycline about 2 weeks ago which he finished last week. Past medical history; type 2 diabetes, BPH, hypertension, coronary artery disease, hyperlipidemia and PAD ABIs were not obtainable in the clinic today. 1/13; type II diabetic man  who has wounds on the left great toenail bed on his first toe and the left lateral foot. Both of these wounds accompanied by really significant PAD status post stenting of the left superficial femoral artery by Dr. Delana Meyer on 12/15. He is not describing claudication pain however he seems to be limiting his activity. We have been using Santyl to both wound areas. X-ray of the left foot did not show any osseous erosions or periostitis to suggest osteomyelitis. 1/20; the area on his left lateral foot improved. Surface area smaller wound bed healthy we have been using Santyl. oThe real problem is the nail bed on the left great toe. Still exposed bone although better looking granulation tissue today. 1/27; patient has wounds on the left lateral foot and the nailbed of the left great toe. Bone that I sent for culture from the left great toe last time came back showing abundant Enterobacter Asburiae. We have been using Santyl 2/3; patient has wounds on the left lateral foot and the nailbed of the left great toe. He has osteomyelitis in the distal phalanx of the left great toe which I previously biopsied showing Enterobacter. I have him on Levaquin he has been on this for a week he is tolerating this well without significant side effects. The wound that was there on the nailbed and tip of the left great toe has closed over. The area on the left lateral foot is smaller 2/10; the patient's left great toe remains healed covered with a mycotic nail. He is tolerating ciprofloxacin [not Levaquin] well and I will need to reorder this today 4 weeks 3 and 4 no side effects.  The area on his left lateral foot is also a lot better. 2/24; 2-week follow-up. He has finished the ciprofloxacin. His wound on the left great toe is closed. His antibiotics can be completed. Electronic Signature(s) Signed: 03/05/2019 5:01:35 PM By: Linton Ham MD Entered By: Linton Ham on 03/05/2019 11:51:44 Dylan Robles  ([id number]) -------------------------------------------------------------------------------- Physical Exam Details Patient Name: Dylan Robles, Dylan Robles. Date of Service: 03/05/2019 10:45 AM Medical Record Number: [id number] Patient Account Number: [id number] Date of Birth/Sex: 05/28/1940 (79 y.o. M) Treating RN: Cornell Barman Primary Care Provider: Lucia Gaskins Other Clinician: Referring Provider: Lucia Gaskins Treating Provider/Extender: Tito Dine in Treatment: 7 Notes Wound exam oThe wound at the tip and the nailbed of the left great toe is healed. The area on the left foot is also healed. oThere is nothing worrisome about either these areas no erythema, no signs of infection Electronic Signature(s) Signed: 03/05/2019 5:01:35 PM By: Linton Ham MD Entered By: Linton Ham on 03/05/2019 11:52:26 Dylan Robles ([id number]) -------------------------------------------------------------------------------- Physician Orders Details Patient Name: Dylan Robles, Dylan Robles. Date of Service: 03/05/2019 10:45 AM Medical Record Number: [id number] Patient Account Number: [id number] Date of Birth/Sex: [birth year] (79 y.o. M) Treating RN: Cornell Barman Primary Care Provider: Lucia Gaskins Other Clinician: Referring Provider: Lucia Gaskins Treating Provider/Extender: Tito Dine in Treatment: 7 Verbal / Phone Orders: No Diagnosis Coding Discharge From [facility] o Discharge from [place] complete Electronic Signature(s) Signed: 03/05/2019 5:01:35 PM By: Linton Ham MD Signed: 03/14/2019 5:40:44 PM By: Gretta Cool, BSN, RN, CWS, Kim RN, BSN Entered By: Gretta Cool, BSN, RN, CWS, Kim on 03/05/2019 11:14:38 Dylan Robles ([id number]) -------------------------------------------------------------------------------- Problem List Details Patient Name: Dylan Robles, Dylan Robles. Date of Service: 03/05/2019 10:45 AM Medical Record Number: [id number] Patient Account Number:  [id number] Date of Birth/Sex: [birth year] (79 y.o. M) Treating RN: Cornell Barman Primary Care Provider: Lucia Gaskins Other Clinician: Referring Provider: Lucia Gaskins Treating Provider/Extender: Tito Dine in Treatment: 7 Active Problems ICD-10 Evaluated Encounter Code Description Active Date Today Diagnosis E11.621 Type 2 diabetes mellitus with foot ulcer 01/15/2019 No Yes M86.672 Other chronic osteomyelitis, left ankle and foot 02/05/2019 No Yes E11.51 Type 2 diabetes mellitus with diabetic peripheral angiopathy without 01/15/2019 No Yes gangrene L97.524 Non-pressure chronic ulcer of other part of left foot with necrosis of bone 01/15/2019 No Yes L97.528 Non-pressure chronic ulcer of other part of left foot with other specified 01/15/2019 No Yes severity E11.42 Type 2 diabetes mellitus with diabetic polyneuropathy 01/15/2019 No Yes Inactive Problems Resolved Problems Electronic Signature(s) Signed: 03/05/2019 5:01:35 PM By: Linton Ham MD Entered By: Linton Ham on 03/05/2019 11:50:44 Dylan Robles ([id number]) -------------------------------------------------------------------------------- Progress Note Details Patient Name: Dylan Robles, Dylan Robles. Date of Service: 03/05/2019 10:45 AM Medical Record Number: [id number] Patient Account Number: [id number] Date of Birth/Sex: Dec 03, 1940 (79 y.o. M) Treating RN: Cornell Barman Primary Care Provider: Lucia Gaskins Other Clinician: Referring Provider: Lucia Gaskins Treating Provider/Extender: Tito Dine in Treatment: 7 Subjective History of Present Illness (HPI) ADMISSION 01/15/2019 Patient is a pleasant 79 year old type II diabetic man who arrives accompanied by his wife from [place]. His story begins in early September 2020. He developed purulence from the corner of his left great toe. He saw his podiatrist Dr. Sharyon Cable who diagnosed a severe chronic paronychia. He had total avulsion of the nail  because of the paronychia today on 9/17. He was followed weekly by Dr. Sharyon Cable developed a nonhealing area on the nailbed of the left great toe although  his pain was improved. On 10/20/2018 he was noted to have a new wound on the left lateral foot to which she is was applying Neosporin. His dressings were changed to peroxide to the nailbed and Silvadene on 10/27. On 12/7 it was noted that he had had previous Dopplers ordered by his primary care doctor in August. These were really quite poor. ABI on the right was 0.72, 0.44 TBI. On the left 0.54 ABI with a TBI of 0.20. Monophasic waveforms bilaterally. He was referred to Dr. Delana Meyer. On 12/15 he underwent an angiogram and had a stent placed in the left superficial femoral artery along with angioplasty. Since then his pain is a lot better. He is still using Silvadene to the wounds. He was prescribed doxycycline about 2 weeks ago which he finished last week. Past medical history; type 2 diabetes, BPH, hypertension, coronary artery disease, hyperlipidemia and PAD ABIs were not obtainable in the clinic today. 1/13; type II diabetic man who has wounds on the left great toenail bed on his first toe and the left lateral foot. Both of these wounds accompanied by really significant PAD status post stenting of the left superficial femoral artery by Dr. Delana Meyer on 12/15. He is not describing claudication pain however he seems to be limiting his activity. We have been using Santyl to both wound areas. X-ray of the left foot did not show any osseous erosions or periostitis to suggest osteomyelitis. 1/20; the area on his left lateral foot improved. Surface area smaller wound bed healthy we have been using Santyl. The real problem is the nail bed on the left great toe. Still exposed bone although better looking granulation tissue today. 1/27; patient has wounds on the left lateral foot and the nailbed of the left great toe. Bone that I sent for culture from the left  great toe last time came back showing abundant Enterobacter Asburiae. We have been using Santyl 2/3; patient has wounds on the left lateral foot and the nailbed of the left great toe. He has osteomyelitis in the distal phalanx of the left great toe which I previously biopsied showing Enterobacter. I have him on Levaquin he has been on this for a week he is tolerating this well without significant side effects. The wound that was there on the nailbed and tip of the left great toe has closed over. The area on the left lateral foot is smaller 2/10; the patient's left great toe remains healed covered with a mycotic nail. He is tolerating ciprofloxacin [not Levaquin] well and I will need to reorder this today 4 weeks 3 and 4 no side effects. The area on his left lateral foot is also a lot better. 2/24; 2-week follow-up. He has finished the ciprofloxacin. His wound on the left great toe is closed. His antibiotics can be completed. Objective Constitutional Vitals Time Taken: 10:45 AM, Height: 78 in, Weight: 206 lbs, BMI: 23.8, Temperature: 98.3 F, Pulse: 88 bpm, Respiratory Rate: 16 breaths/min, Blood Pressure: 152/80 mmHg. Integumentary (Hair, Skin) Wound #1 status is Healed - Epithelialized. Original cause of wound was Gradually Appeared. The wound is located on the [employer]. The wound measures 0cm length x 0cm width x 0cm depth; 0cm^2 area and 0cm^3 volume. There is a none present amount of drainage noted. The wound margin is flat and intact. There is no granulation within the wound bed. There is a large (67-100%) amount of necrotic tissue within the wound bed including Eschar. Dylan Robles, Dylan Robles ([id number]) Wound #2 status  is Healed - Epithelialized. Original cause of wound was Gradually Appeared. The wound is located on the Left,Lateral Foot. The wound measures 0cm length x 0cm width x 0cm depth; 0cm^2 area and 0cm^3 volume. There is no tunneling or undermining noted. There is a  none present amount of drainage noted. The wound margin is flat and intact. There is no granulation within the wound bed. There is a medium (34-66%) amount of necrotic tissue within the wound bed including Eschar. Assessment Active Problems ICD-10 Type 2 diabetes mellitus with foot ulcer Other chronic osteomyelitis, left ankle and foot Type 2 diabetes mellitus with diabetic peripheral angiopathy without gangrene Non-pressure chronic ulcer of other part of left foot with necrosis of bone Non-pressure chronic ulcer of other part of left foot with other specified severity Type 2 diabetes mellitus with diabetic polyneuropathy Plan Discharge From [facility] Services: Discharge from [place] complete 1. The patient can be discharged from the wound care center 2. Again I have explained the concept of closed versus healed to the patient and his wife in some detail. I would not be prepared to say this is technically healed for another 9 to 12 months Electronic Signature(s) Signed: 03/05/2019 5:01:35 PM By: Linton Ham MD Entered By: Linton Ham on 03/05/2019 11:53:19 Dylan Robles ([id number]) -------------------------------------------------------------------------------- [place] Details Patient Name: Dylan Robles, Dylan Robles. Date of Service: 03/05/2019 Medical Record Number: [id number] Patient Account Number: [id number] Date of Birth/Sex: January 04, 1941 (79 y.o. M) Treating RN: Cornell Barman Primary Care Provider: Lucia Gaskins Other Clinician: Referring Provider: Lucia Gaskins Treating Provider/Extender: Tito Dine in Treatment: 7 Diagnosis Coding ICD-10 Codes Code Description E11.621 Type 2 diabetes mellitus with foot ulcer M86.672 Other chronic osteomyelitis, left ankle and foot E11.51 Type 2 diabetes mellitus with diabetic peripheral angiopathy without gangrene L97.524 Non-pressure chronic ulcer of other part of left foot with necrosis of bone L97.528  Non-pressure chronic ulcer of other part of left foot with other specified severity E11.42 Type 2 diabetes mellitus with diabetic polyneuropathy Facility Procedures CPT4 Code: [id number] Description: [phone] - WOUND CARE VISIT-LEV 2 EST PT Modifier: Quantity: 1 Physician Procedures CPT4 Code: [id number] Description: [id number] - WC PHYS LEVEL 2 - EST PT Modifier: Quantity: 1 CPT4 Code: Description: ICD-10 Diagnosis Description M86.672 Other chronic osteomyelitis, left ankle and foot E11.621 Type 2 diabetes mellitus with foot ulcer Modifier: Quantity: Electronic Signature(s) Signed: 03/05/2019 5:01:35 PM By: Linton Ham MD Entered By: Linton Ham on 03/05/2019 11:54:10

## 2019-03-19 ENCOUNTER — Other Ambulatory Visit: Payer: Self-pay

## 2019-03-19 ENCOUNTER — Ambulatory Visit (INDEPENDENT_AMBULATORY_CARE_PROVIDER_SITE_OTHER): Payer: Medicare HMO | Admitting: Nurse Practitioner

## 2019-03-19 VITALS — BP 100/62 | HR 76 | Temp 98.7°F | Resp 18 | Ht 78.0 in | Wt 217.0 lb

## 2019-03-19 DIAGNOSIS — E782 Mixed hyperlipidemia: Secondary | ICD-10-CM

## 2019-03-19 DIAGNOSIS — Z7689 Persons encountering health services in other specified circumstances: Secondary | ICD-10-CM

## 2019-03-19 DIAGNOSIS — E1169 Type 2 diabetes mellitus with other specified complication: Secondary | ICD-10-CM

## 2019-03-19 DIAGNOSIS — E039 Hypothyroidism, unspecified: Secondary | ICD-10-CM

## 2019-03-19 DIAGNOSIS — E1142 Type 2 diabetes mellitus with diabetic polyneuropathy: Secondary | ICD-10-CM

## 2019-03-19 DIAGNOSIS — E785 Hyperlipidemia, unspecified: Secondary | ICD-10-CM

## 2019-03-19 DIAGNOSIS — I10 Essential (primary) hypertension: Secondary | ICD-10-CM

## 2019-03-19 DIAGNOSIS — Z87438 Personal history of other diseases of male genital organs: Secondary | ICD-10-CM

## 2019-03-19 LAB — URINALYSIS, ROUTINE W REFLEX MICROSCOPIC
Bilirubin Urine: NEGATIVE
Glucose, UA: NEGATIVE
Hgb urine dipstick: NEGATIVE
Ketones, ur: NEGATIVE
Leukocytes,Ua: NEGATIVE
Nitrite: NEGATIVE
Protein, ur: NEGATIVE
Specific Gravity, Urine: 1.025 (ref 1.001–1.03)
pH: 5.5 (ref 5.0–8.0)

## 2019-03-19 MED ORDER — ISOSORBIDE MONONITRATE ER 30 MG PO TB24: 30.0000 mg | ORAL_TABLET | Freq: Every day | ORAL | 1 refills | Status: DC

## 2019-03-19 MED ORDER — METOPROLOL SUCCINATE ER 50 MG PO TB24: 50.0000 mg | ORAL_TABLET | Freq: Every day | ORAL | 1 refills | Status: DC

## 2019-03-19 MED ORDER — LEVOTHYROXINE SODIUM 88 MCG PO TABS: 88.0000 ug | ORAL_TABLET | Freq: Every day | ORAL | 1 refills | Status: DC

## 2019-03-19 MED ORDER — AMLODIPINE BESYLATE 5 MG PO TABS: 5.0000 mg | ORAL_TABLET | Freq: Every day | ORAL | 1 refills | Status: DC

## 2019-03-19 MED ORDER — METFORMIN HCL 1000 MG PO TABS: 1000.0000 mg | ORAL_TABLET | Freq: Two times a day (BID) | ORAL | 1 refills | Status: DC

## 2019-03-19 MED ORDER — ATORVASTATIN CALCIUM 80 MG PO TABS: 80.0000 mg | ORAL_TABLET | Freq: Every day | ORAL | 1 refills | Status: DC

## 2019-03-19 MED ORDER — TAMSULOSIN HCL 0.4 MG PO CAPS: 0.4000 mg | ORAL_CAPSULE | Freq: Every day | ORAL | 1 refills | Status: DC

## 2019-03-19 MED ORDER — GABAPENTIN 300 MG PO CAPS: 300.0000 mg | ORAL_CAPSULE | Freq: Two times a day (BID) | ORAL | 1 refills | Status: DC

## 2019-03-19 MED ORDER — TAMSULOSIN HCL 0.4 MG PO CAPS: 0.4000 mg | ORAL_CAPSULE | Freq: Every day | ORAL | 0 refills | Status: DC

## 2019-03-19 NOTE — Progress Notes (Signed)
New Patient Office Visit  Subjective:  Patient ID: Dylan Robles, male    DOB: May 05, 1940  Age: 79 y.o. MRN: [id number]  CC: here to establish care  HPI Dylan Robles is a 79 year old A.A. male presenting to establish care. Health history and medications discussed and reviewed. No cp/ct, gu/gi sxs, pain, edema, sob, or falls.  He would like a few of his medications refilled, he will let the CMA know which ones.  He is a known Diabetic and desires to have PCP manage pending workup r/t this apt is est. Care visit. He has apt next week with diabetic specialist.  Time spent discussing daily foot examinations at home and he has a good understanding.   Past Medical History:  Diagnosis Date  . Coronary artery disease    hyperlipidemia  . Diabetes mellitus   . Diabetes mellitus without complication ([place])   . Hypercholesterolemia   . Hypertension   . Hypothyroidism   . Shortness of breath dyspnea    with exertion  . Vasovagal syncope     Past Surgical History:  Procedure Laterality Date  . APPENDECTOMY    . CARDIAC CATHETERIZATION N/A 06/16/2014   Procedure: Left Heart Cath;  Surgeon: Dionisio David, MD;  Location: [place] [facility];  Service: Cardiovascular;  Laterality: N/A;  . CIRCUMCISION, NON-NEWBORN    . HERNIA REPAIR    . LOWER EXTREMITY ANGIOGRAPHY Left 12/24/2018   Procedure: LOWER EXTREMITY ANGIOGRAPHY;  Surgeon: Katha Cabal, MD;  Location: [place] [facility];  Service: Cardiovascular;  Laterality: Left;  . THYROID SURGERY      Family History  Problem Relation Age of Onset  . Diabetes Father   . Hypertension Father   . Cancer Mother   . Diabetes Brother     Social History   Socioeconomic History  . Marital status: Married    Spouse name: Not on file  . Number of children: Not on file  . Years of education: Not on file  . Highest education level: Not on file  Occupational History  . 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  Smoking status: Former Research scientist (life sciences)  . Smokeless tobacco: Never Used  Substance and Sexual Activity  . Alcohol use: No  . Drug use: No  . Sexual activity: Not on file  Other Topics Concern  . Not on file  Social History Narrative   ** Merged History Encounter **       Social Determinants of Health   Financial Resource Strain:   . Difficulty of Paying Living Expenses: Not on file  Food Insecurity:   . Worried About Charity fundraiser in the Last Year: Not on file  . Ran Out of Food in the Last Year: Not on file  Transportation Needs:   . Lack of Transportation (Medical): Not on file  . Lack of Transportation (Non-Medical): Not on file  Physical Activity:   . Days of Exercise per Week: Not on file  . Minutes of Exercise per Session: Not on file  Stress:   . Feeling of Stress : Not on file  Social Connections:   . Frequency of Communication with Friends and Family: Not on file  . Frequency of Social Gatherings with Friends and Family: Not on file  . Attends Religious Services: Not on file  . Active Member of Clubs or Organizations: Not on file  . Attends Archivist Meetings: Not on file  .  Marital Status: Not on file  Intimate Partner Violence:   . Fear of Current or Ex-Partner: Not on file  . Emotionally Abused: Not on file  . Physically Abused: Not on file  . Sexually Abused: Not on file    ROS Review of Systems  All other systems reviewed and are negative.   Objective:   Today's Vitals: BP 100/62 (BP Location: Left Arm, Patient Position: Sitting, Cuff Size: Normal)   Pulse 76   Temp 98.7 F (37.1 C) (Oral)   Resp 18   Ht 6\' 6"  (1.981 m)   Wt 217 lb (98.4 kg)   SpO2 98%   BMI 25.08 kg/m   Physical Exam Vitals and nursing note reviewed.  Constitutional:      Appearance: Normal appearance. He is well-developed and well-groomed.  HENT:     Head: Normocephalic.     Right Ear: Hearing, tympanic membrane, ear canal and external ear normal.     Left Ear:  Hearing, tympanic membrane, ear canal and external ear normal.     Nose: Nose normal.     Mouth/Throat:     Mouth: Mucous membranes are dry.     Dentition: Has dentures.  Eyes:     General: Lids are normal. Lids are everted, no foreign bodies appreciated.     Extraocular Movements: Extraocular movements intact.     Conjunctiva/sclera: Conjunctivae normal.     Comments: Bilateral cornea arcu  Neck:     Thyroid: No thyromegaly or thyroid tenderness.  Cardiovascular:     Rate and Rhythm: Normal rate and regular rhythm.     Pulses: Normal pulses.     Heart sounds: Normal heart sounds.  Pulmonary:     Effort: Pulmonary effort is normal.     Breath sounds: Normal breath sounds.  Chest:     Chest wall: No deformity.  Abdominal:     General: Abdomen is flat. Bowel sounds are normal. There is no abdominal bruit.     Palpations: Abdomen is soft.     Tenderness: There is no abdominal tenderness.     Hernia: No hernia is present.  Musculoskeletal:        General: Normal range of motion.     Cervical back: Normal range of motion and neck supple.     Right lower leg: No edema.     Left lower leg: No edema.  Lymphadenopathy:     Head:     Right side of head: No submental, submandibular, tonsillar, preauricular, posterior auricular or occipital adenopathy.     Left side of head: No submental, submandibular, tonsillar, preauricular, posterior auricular or occipital adenopathy.     Cervical: No cervical adenopathy.  Skin:    General: Skin is warm and dry.     Capillary Refill: Capillary refill takes less than 2 seconds.  Neurological:     General: No focal deficit present.     Mental Status: He is alert and oriented to person, place, and time.  Psychiatric:        Attention and Perception: Attention normal.        Mood and Affect: Mood normal.        Speech: Speech normal.        Behavior: Behavior normal. Behavior is cooperative.        Cognition and Memory: Cognition normal.         Judgment: Judgment normal.     Assessment & Plan:  Established Care Today, Labs drawn and will be available  on mychart and abnormals will be called to you.  Follow up planned for every 6 months with labs one week prior. Foot home examination daily, report changes.   Problem List Items Addressed This Visit      Endocrine   Hypothyroidism   Relevant Orders   T4, free   TSH   Type 2 diabetes mellitus with hyperlipidemia ([place])   Relevant Orders   Hemoglobin A1c     Other   Hyperlipidemia   Relevant Orders   Lipid panel    Other Visit Diagnoses    Encounter to establish care with new doctor    -  Primary   Relevant Orders   Lipid panel   COMPLETE METABOLIC PANEL WITH GFR   CBC with Differential/Platelet   Hemoglobin A1c   T4, free   TSH   Urinalysis, Routine w reflex microscopic (Completed)   History of BPH          Follow-up: Return in about 6 months (around 09/19/2019) for have labs in clinic one week prior to appointment.   Annie Main, FNP

## 2019-03-19 NOTE — Addendum Note (Signed)
Addended by: Ishmael Holter on: 03/19/2019 04:40 PM   Modules accepted: Orders

## 2019-03-20 ENCOUNTER — Other Ambulatory Visit: Payer: Self-pay | Admitting: Nurse Practitioner

## 2019-03-20 LAB — T4, FREE: Free T4: 1.3 ng/dL (ref 0.8–1.8)

## 2019-03-20 LAB — COMPLETE METABOLIC PANEL WITH GFR
AG Ratio: 1.7 (calc) (ref 1.0–2.5)
ALT: 12 U/L (ref 9–46)
AST: 15 U/L (ref 10–35)
Albumin: 3.9 g/dL (ref 3.6–5.1)
Alkaline phosphatase (APISO): 81 U/L (ref 35–144)
BUN/Creatinine Ratio: 12 (calc) (ref 6–22)
BUN: 15 mg/dL (ref 7–25)
CO2: 26 mmol/L (ref 20–32)
Calcium: 9.8 mg/dL (ref 8.6–10.3)
Chloride: 107 mmol/L (ref 98–110)
Creat: 1.21 mg/dL — ABNORMAL HIGH (ref 0.70–1.11)
GFR, Est African American: 65 mL/min/{1.73_m2} (ref >=60)
GFR, Est Non African American: 56 mL/min/{1.73_m2} — ABNORMAL LOW (ref >=60)
Globulin: 2.3 g/dL (calc) (ref 1.9–3.7)
Glucose, Bld: 119 mg/dL — ABNORMAL HIGH (ref 65–99)
Potassium: 4.8 mmol/L (ref 3.5–5.3)
Sodium: 140 mmol/L (ref 135–146)
Total Bilirubin: 0.8 mg/dL (ref 0.2–1.2)
Total Protein: 6.2 g/dL (ref 6.1–8.1)

## 2019-03-20 LAB — CBC WITH DIFFERENTIAL/PLATELET
Absolute Monocytes: 605 cells/uL (ref 200–950)
Basophils Absolute: 61 cells/uL (ref 0–200)
Basophils Relative: 0.9 %
Eosinophils Absolute: 190 cells/uL (ref 15–500)
Eosinophils Relative: 2.8 %
HCT: 32.8 % — ABNORMAL LOW (ref 38.5–50.0)
Hemoglobin: 10.5 g/dL — ABNORMAL LOW (ref 13.2–17.1)
Lymphs Abs: 1448 cells/uL (ref 850–3900)
MCH: 29.1 pg (ref 27.0–33.0)
MCHC: 32 g/dL (ref 32.0–36.0)
MCV: 90.9 fL (ref 80.0–100.0)
MPV: 10.6 fL (ref 7.5–12.5)
Monocytes Relative: 8.9 %
Neutro Abs: 4495 cells/uL (ref 1500–7800)
Neutrophils Relative %: 66.1 %
Platelets: 316 10*3/uL (ref 140–400)
RBC: 3.61 10*6/uL — ABNORMAL LOW (ref 4.20–5.80)
RDW: 14.4 % (ref 11.0–15.0)
Total Lymphocyte: 21.3 %
WBC: 6.8 10*3/uL (ref 3.8–10.8)

## 2019-03-20 LAB — LIPID PANEL
Cholesterol: 134 mg/dL (ref <200)
HDL: 45 mg/dL (ref >=40)
LDL Cholesterol (Calc): 72 mg/dL (calc)
Non-HDL Cholesterol (Calc): 89 mg/dL (calc) (ref <130)
Total CHOL/HDL Ratio: 3 (calc) (ref <5.0)
Triglycerides: 83 mg/dL (ref <150)

## 2019-03-20 LAB — TSH: TSH: 0.94 mIU/L (ref 0.40–4.50)

## 2019-03-20 LAB — HEMOGLOBIN A1C
Hgb A1c MFr Bld: 7.9 % of total Hgb — ABNORMAL HIGH (ref <5.7)
Mean Plasma Glucose: 180 (calc)
eAG (mmol/L): 10 (calc)

## 2019-03-20 NOTE — Progress Notes (Signed)
Lab work instructions: follow up as planned in 6 months repeated labs one week prior Take OTC iron with vitamin C source for better absorption daily Watch carbs and sugars.

## 2019-06-19 ENCOUNTER — Ambulatory Visit: Payer: Medicare HMO | Admitting: Family Medicine

## 2019-07-31 ENCOUNTER — Other Ambulatory Visit: Payer: Self-pay | Admitting: Nurse Practitioner

## 2019-07-31 DIAGNOSIS — E785 Hyperlipidemia, unspecified: Secondary | ICD-10-CM

## 2019-08-12 ENCOUNTER — Other Ambulatory Visit: Payer: Self-pay

## 2019-08-12 DIAGNOSIS — I10 Essential (primary) hypertension: Secondary | ICD-10-CM

## 2019-08-12 MED ORDER — ISOSORBIDE MONONITRATE ER 30 MG PO TB24: 30.0000 mg | ORAL_TABLET | Freq: Every day | ORAL | 1 refills | Status: DC

## 2019-08-12 MED ORDER — LISINOPRIL 10 MG PO TABS: 10.0000 mg | ORAL_TABLET | Freq: Every day | ORAL | 1 refills | Status: DC

## 2019-08-13 ENCOUNTER — Other Ambulatory Visit: Payer: Self-pay | Admitting: Nurse Practitioner

## 2019-08-13 DIAGNOSIS — E039 Hypothyroidism, unspecified: Secondary | ICD-10-CM

## 2019-08-15 ENCOUNTER — Other Ambulatory Visit: Payer: Self-pay

## 2019-08-15 DIAGNOSIS — E785 Hyperlipidemia, unspecified: Secondary | ICD-10-CM

## 2019-08-15 MED ORDER — GLUCOSE BLOOD VI STRP: 1.0000 | ORAL_STRIP | 0 refills | Status: DC | PRN

## 2019-08-15 MED ORDER — TRUE METRIX AIR GLUCOSE METER W/DEVICE KIT: 1.0000 | PACK | 0 refills | Status: DC

## 2019-08-15 MED ORDER — TRUE METRIX LEVEL 1 LOW VI SOLN: 1.0000 | 0 refills | Status: DC

## 2019-08-15 MED ORDER — "INSULIN SYRINGE-NEEDLE U-100 30G X 5/16"" 1 ML MISC": 1.0000 | 0 refills | Status: DC

## 2019-08-18 ENCOUNTER — Other Ambulatory Visit: Payer: Self-pay | Admitting: Nurse Practitioner

## 2019-08-18 DIAGNOSIS — Z87438 Personal history of other diseases of male genital organs: Secondary | ICD-10-CM

## 2019-09-01 ENCOUNTER — Other Ambulatory Visit: Payer: Self-pay

## 2019-09-01 ENCOUNTER — Ambulatory Visit (INDEPENDENT_AMBULATORY_CARE_PROVIDER_SITE_OTHER): Payer: Medicare HMO | Admitting: Vascular Surgery

## 2019-09-01 ENCOUNTER — Encounter (INDEPENDENT_AMBULATORY_CARE_PROVIDER_SITE_OTHER): Payer: Self-pay | Admitting: Vascular Surgery

## 2019-09-01 ENCOUNTER — Ambulatory Visit (INDEPENDENT_AMBULATORY_CARE_PROVIDER_SITE_OTHER): Payer: Medicare HMO

## 2019-09-01 VITALS — BP 135/67 | HR 62 | Resp 16 | Wt 214.0 lb

## 2019-09-01 DIAGNOSIS — I10 Essential (primary) hypertension: Secondary | ICD-10-CM | POA: Diagnosis not present

## 2019-09-01 DIAGNOSIS — E1159 Type 2 diabetes mellitus with other circulatory complications: Secondary | ICD-10-CM

## 2019-09-01 DIAGNOSIS — I25118 Atherosclerotic heart disease of native coronary artery with other forms of angina pectoris: Secondary | ICD-10-CM | POA: Diagnosis not present

## 2019-09-01 DIAGNOSIS — E782 Mixed hyperlipidemia: Secondary | ICD-10-CM

## 2019-09-01 DIAGNOSIS — I70213 Atherosclerosis of native arteries of extremities with intermittent claudication, bilateral legs: Secondary | ICD-10-CM

## 2019-09-01 NOTE — Progress Notes (Signed)
MRN : [id number]  Dylan Robles is a 79 y.o. (04-May-1940) male who presents with chief complaint of  Chief Complaint  Patient presents with  . Follow-up    ultrasound follow up  .  History of Present Illness:   Patient is here for follow-up noninvasive studies after angiogram on 12/24/2018 due to lower extremity ulceration. The patient underwent angioplasty of his posterior tibial artery and anterior tibial artery.. The patient most recently began seeing wound care as well. There is some report that the patient's wound is healing however slowly. He does endorse that the pain he was having in his lower extremities is greatly decreased. There have been no fever, chills, nausea vomiting or diarrhea following his procedure.  ABI's Rt=0.73 and Lt=0.70  (previous ABI Rt=0.73 and Lt=1.56)  Left lower extremity arterial duplex reveals biphasic/triphasic waveforms down to the level of the tibial arteries (stent is patent) where it transitions to monophasic flow throughout the calf vessels, essentially unchanged compared to last study.  Current Meds  Medication Sig  . amLODipine (NORVASC) 5 MG tablet Take 1 tablet (5 mg total) by mouth daily.  [name] aspirin EC 81 MG tablet Take 81 mg by mouth daily.   [name] atorvastatin (LIPITOR) 80 MG tablet Take 1 tablet (80 mg total) by mouth at bedtime.  . Blood Glucose Calibration (TRUE METRIX LEVEL 1) Low SOLN 1 each by In Vitro route as directed.  . Blood Glucose Monitoring Suppl (TRUE METRIX AIR GLUCOSE METER) w/Device KIT 1 each by Does not apply route as directed.  [name] BRILINTA 60 MG TABS tablet Take 60 mg by mouth 2 (two) times daily.   [name] gabapentin (NEURONTIN) 300 MG capsule Take 1 capsule (300 mg total) by mouth 2 (two) times daily.  [name] glucose blood test strip 1 each by Other route as needed. Use as instructed  . insulin aspart (NOVOLOG) 100 UNIT/ML injection Inject 5 Units into the skin 3 (three) times daily before meals.   . Insulin Syringe-Needle  U-100 30G X 5/16" 1 ML MISC 1 each by Does not apply route as directed.  . isosorbide mononitrate (IMDUR) 30 MG 24 hr tablet Take 1 tablet (30 mg total) by mouth daily.  [name] LEVEMIR FLEXTOUCH 100 UNIT/ML Pen Inject 34 Units into the skin at bedtime.   [name] levothyroxine (SYNTHROID) 88 MCG tablet TAKE 1 TABLET EVERY DAY  . lisinopril (ZESTRIL) 10 MG tablet Take 1 tablet (10 mg total) by mouth daily.  . metFORMIN (GLUCOPHAGE) 1000 MG tablet TAKE 1 TABLET TWICE DAILY  . metoprolol succinate (TOPROL-XL) 50 MG 24 hr tablet Take 1 tablet (50 mg total) by mouth daily.  . tamsulosin (FLOMAX) 0.4 MG CAPS capsule TAKE 1 CAPSULE AT BEDTIME  . TRESIBA FLEXTOUCH 200 UNIT/ML SOPN     Past Medical History:  Diagnosis Date  . Coronary artery disease    hyperlipidemia  . Diabetes mellitus   . Diabetes mellitus without complication ([place])   . Hypercholesterolemia   . Hypertension   . Hypothyroidism   . Shortness of breath dyspnea    with exertion  . Vasovagal syncope     Past Surgical History:  Procedure Laterality Date  . APPENDECTOMY    . CARDIAC CATHETERIZATION N/A 06/16/2014   Procedure: Left Heart Cath;  Surgeon: Dionisio David, MD;  Location: [facility];  Service: Cardiovascular;  Laterality: N/A;  . CIRCUMCISION, NON-NEWBORN    . HERNIA REPAIR    . LOWER EXTREMITY ANGIOGRAPHY Left 12/24/2018  Procedure: LOWER EXTREMITY ANGIOGRAPHY;  Surgeon: Katha Cabal, MD;  Location: [facility];  Service: Cardiovascular;  Laterality: Left;  . THYROID SURGERY      Social History Social History   Tobacco Use  . Smoking status: Former Research scientist (life sciences)  . Smokeless tobacco: Never Used  Substance Use Topics  . Alcohol use: No  . Drug use: No    Family History Family History  Problem Relation Age of Onset  . Diabetes Father   . Hypertension Father   . Cancer Mother   . Diabetes Brother     Allergies  Allergen Reactions  . Plavix [Clopidogrel] Hives     REVIEW OF SYSTEMS  (Negative unless checked)  Constitutional: _0 Weight loss  _1 Fever  _2 Chills Cardiac: _3 Chest pain   _4 Chest pressure   _5 Palpitations   _6 Shortness of breath when laying flat   _7 Shortness of breath with exertion. Vascular:  _8 Pain in legs with walking   _9 Pain in legs at rest  _10 History of DVT   _11 Phlebitis   _12 Swelling in legs   _13 Varicose veins   _14 Non-healing ulcers Pulmonary:   _15 Uses home oxygen   _16 Productive cough   _17 Hemoptysis   _18 Wheeze  _19 COPD   _20 Asthma Neurologic:  _21 Dizziness   _22 Seizures   _23 History of stroke   _24 History of TIA  _25 Aphasia   _26 Vissual changes   _27 Weakness or numbness in arm   _28 Weakness or numbness in leg Musculoskeletal:   _29 Joint swelling   _30 Joint pain   _31 Low back pain Hematologic:  _32 Easy bruising  _33 Easy bleeding   _34 Hypercoagulable state   _35 Anemic Gastrointestinal:  _36 Diarrhea   _37 Vomiting  _38 Gastroesophageal reflux/heartburn   _39 Difficulty swallowing. Genitourinary:  _40 Chronic kidney disease   _41 Difficult urination  _42 Frequent urination   _43 Blood in urine Skin:  _44 Rashes   _45 Ulcers  Psychological:  _46 History of anxiety   _47  History of major depression.  Physical Examination  Vitals:   09/01/19 1416  BP: 135/67  Pulse: 62  Resp: 16  Weight: 214 lb (97.1 kg)   Body mass index is 24.73 kg/m. Gen: WD/WN, NAD Head: [place]/AT, No temporalis wasting.  Ear/Nose/Throat: Hearing grossly intact, nares w/o erythema or drainage Eyes: PER, EOMI, sclera nonicteric.  Neck: Supple, no large masses.   Pulmonary:  Good air movement, no audible wheezing bilaterally, no use of accessory muscles.  Cardiac: RRR, no JVD Vascular: scattered varicosities present bilaterally.  Mild venous stasis changes to the legs bilaterally.  1+ soft pitting edema Vessel Right Left  Radial Palpable Palpable  PT Not Palpable Not Palpable  DP Not Palpable Not Palpable  Gastrointestinal: Non-distended. No guarding/no peritoneal signs.  Musculoskeletal: M/S 5/5 throughout.  No  deformity or atrophy.  Neurologic: CN 2-12 intact. Symmetrical.  Speech is fluent. Motor exam as listed above. Psychiatric: Judgment intact, Mood & affect appropriate for pt's clinical situation. Dermatologic: No rashes or ulcers noted.  No changes consistent with cellulitis. Lymph : No lichenification or skin changes of chronic lymphedema.  CBC Lab Results  Component Value Date   WBC 6.8 03/19/2019   HGB 10.5 (L) 03/19/2019   HCT 32.8 (L) 03/19/2019   MCV 90.9 03/19/2019   PLT 316 03/19/2019    BMET    Component Value Date/Time   NA 140 03/19/2019 1515   K 4.8 03/19/2019 1515   CL 107 03/19/2019 1515   CO2 26 03/19/2019 1515   GLUCOSE 119 (H) 03/19/2019 1515   BUN 15 03/19/2019 1515   CREATININE 1.21 (H) 03/19/2019 1515   CALCIUM  9.8 03/19/2019 1515   GFRNONAA 56 (L) 03/19/2019 1515   GFRAA 65 03/19/2019 1515   CrCl cannot be calculated (Patient's most recent lab result is older than the maximum 21 days allowed.).  COAG No results found for: INR, PROTIME  Radiology No results found.   Assessment/Plan 1. Atherosclerosis of native artery of both lower extremities with intermittent claudication (HCC) Recommend:  The patient has evidence of atherosclerosis of the lower extremities with claudication.  The patient does not voice lifestyle limiting changes at this point in time.  Noninvasive studies do not suggest clinically significant change.  No invasive studies, angiography or surgery at this time The patient should continue walking and begin a more formal exercise program.  The patient should continue antiplatelet therapy and aggressive treatment of the lipid abnormalities  No changes in the patient's medications at this time  The patient should continue wearing graduated compression socks 10-15 mmHg strength to control the mild edema.  - VAS [place] ABI WITH/WO TBI; Future - VAS [place] LOWER EXTREMITY ARTERIAL DUPLEX; Future  2. Coronary artery disease of native  artery of native heart with stable angina pectoris (HCC) Continue cardiac and antihypertensive medications as already ordered and reviewed, no changes at this time.  Continue statin as ordered and reviewed, no changes at this time  Nitrates PRN for chest pain   3. Essential hypertension Continue antihypertensive medications as already ordered, these medications have been reviewed and there are no changes at this time.   4. Type 2 diabetes mellitus with vascular disease ([place]) Continue hypoglycemic medications as already ordered, these medications have been reviewed and there are no changes at this time.  Hgb A1C to be monitored as already arranged by primary service   5. Mixed hyperlipidemia Continue statin as ordered and reviewed, no changes at this time    Hortencia Pilar, MD  09/01/2019 2:33 PM

## 2019-09-03 ENCOUNTER — Encounter (INDEPENDENT_AMBULATORY_CARE_PROVIDER_SITE_OTHER): Payer: Self-pay | Admitting: Vascular Surgery

## 2019-09-22 ENCOUNTER — Other Ambulatory Visit: Payer: Self-pay

## 2019-09-22 ENCOUNTER — Ambulatory Visit (INDEPENDENT_AMBULATORY_CARE_PROVIDER_SITE_OTHER): Payer: Medicare HMO | Admitting: Nurse Practitioner

## 2019-09-22 VITALS — BP 138/70 | HR 74 | Temp 99.3°F | Resp 18 | Wt 218.6 lb

## 2019-09-22 DIAGNOSIS — E1169 Type 2 diabetes mellitus with other specified complication: Secondary | ICD-10-CM

## 2019-09-22 DIAGNOSIS — E1142 Type 2 diabetes mellitus with diabetic polyneuropathy: Secondary | ICD-10-CM | POA: Diagnosis not present

## 2019-09-22 DIAGNOSIS — E039 Hypothyroidism, unspecified: Secondary | ICD-10-CM

## 2019-09-22 DIAGNOSIS — Z09 Encounter for follow-up examination after completed treatment for conditions other than malignant neoplasm: Secondary | ICD-10-CM

## 2019-09-22 DIAGNOSIS — Z23 Encounter for immunization: Secondary | ICD-10-CM | POA: Diagnosis not present

## 2019-09-22 DIAGNOSIS — I739 Peripheral vascular disease, unspecified: Secondary | ICD-10-CM

## 2019-09-22 DIAGNOSIS — E785 Hyperlipidemia, unspecified: Secondary | ICD-10-CM

## 2019-09-22 DIAGNOSIS — I10 Essential (primary) hypertension: Secondary | ICD-10-CM | POA: Diagnosis not present

## 2019-09-22 DIAGNOSIS — I25118 Atherosclerotic heart disease of native coronary artery with other forms of angina pectoris: Secondary | ICD-10-CM

## 2019-09-22 NOTE — Progress Notes (Signed)
Established Patient Office Visit  Subjective:  Patient ID: Dylan Robles, male    DOB: 1940/04/27  Age: 79 y.o. MRN: [id number]  CC:  Chief Complaint  Patient presents with   Follow-up    6 month    HPI Dylan Robles is a 79 year old male that presents for a 6 month follow up for chronic disease management. He did not obtain labs prior to this visit will collect today.  Treatment plan discussed and pt v.l. and u/s.   With a hx CAD, PVD, Hyperlipidemia, DM2, HTN, Hypothyroidism  Care Gaps/Screen: Vaccines due: Flu COVID Tdap PCV 13 Diabetic opthamology due DM home foot at home reminder today Pt will complete Flu vaccine today, has completed COVID Vaccine, will complete labs today results to be called, f/u with dr Dennard Schaumann or [facility] in 6 months for CPE, will make nsg appt for vaccine pnx 132 and td in 2 weeks not desiring three vaccinations in one day.   He feels in overall good health today, no concerns, no cp, ct, gu/gi sxs, pain, sob, edema, or recent falls. No depression.  Past Medical History:  Diagnosis Date   Coronary artery disease    hyperlipidemia   Diabetes mellitus    Diabetes mellitus without complication ([place])    Hypercholesterolemia    Hypertension    Hypothyroidism    Shortness of breath dyspnea    with exertion   Vasovagal syncope     Past Surgical History:  Procedure Laterality Date   APPENDECTOMY     CARDIAC CATHETERIZATION N/A 06/16/2014   Procedure: Left Heart Cath;  Surgeon: Dionisio David, MD;  Location: [facility];  Service: Cardiovascular;  Laterality: N/A;   CIRCUMCISION, NON-NEWBORN     HERNIA REPAIR     LOWER EXTREMITY ANGIOGRAPHY Left 12/24/2018   Procedure: LOWER EXTREMITY ANGIOGRAPHY;  Surgeon: Katha Cabal, MD;  Location: [facility];  Service: Cardiovascular;  Laterality: Left;   THYROID SURGERY      Family History  Problem Relation Age of Onset   Diabetes Father    Hypertension Father     Cancer Mother    Diabetes Brother     Social History   Socioeconomic History   Marital status: Married    Spouse name: Not on file   Number of children: Not on file   Years of education: Not on file   Highest education level: Not on file  Occupational History   Occupation: Truck Geophysicist/field seismologist    Comment: Retired  Tobacco Use   Smoking status: Former Smoker   Smokeless tobacco: Never Used  Substance and Sexual Activity   Alcohol use: No   Drug use: No   Sexual activity: Not on file  Other Topics Concern   Not on file  Social History Narrative   ** Merged History Encounter **       Social Determinants of Health   Financial Resource Strain:    Difficulty of Paying Living Expenses: Not on file  Food Insecurity:    Worried About Charity fundraiser in the Last Year: Not on file   [place] in the Last Year: Not on file  Transportation Needs:    Lack of Transportation (Medical): Not on file   Lack of Transportation (Non-Medical): Not on file  Physical Activity:    Days of Exercise per Week: Not on file   Minutes of Exercise per Session: Not on file  Stress:  Feeling of Stress : Not on file  Social Connections:    Frequency of Communication with Friends and Family: Not on file   Frequency of Social Gatherings with Friends and Family: Not on file   Attends Religious Services: Not on file   Active Member of Clubs or Organizations: Not on file   Attends Archivist Meetings: Not on file   Marital Status: Not on file  Intimate Partner Violence:    Fear of Current or Ex-Partner: Not on file   Emotionally Abused: Not on file   Physically Abused: Not on file   Sexually Abused: Not on file    Outpatient Medications Prior to Visit  Medication Sig Dispense Refill   amLODipine (NORVASC) 5 MG tablet Take 1 tablet (5 mg total) by mouth daily. 90 tablet 1   aspirin EC 81 MG tablet Take 81 mg by mouth daily.      atorvastatin  (LIPITOR) 80 MG tablet Take 1 tablet (80 mg total) by mouth at bedtime. 90 tablet 1   Blood Glucose Calibration (TRUE METRIX LEVEL 1) Low SOLN 1 each by In Vitro route as directed. 1 each 0   Blood Glucose Monitoring Suppl (TRUE METRIX AIR GLUCOSE METER) w/Device KIT 1 each by Does not apply route as directed. 1 kit 0   BRILINTA 60 MG TABS tablet Take 60 mg by mouth 2 (two) times daily.      gabapentin (NEURONTIN) 300 MG capsule Take 1 capsule (300 mg total) by mouth 2 (two) times daily. 180 capsule 1   glucose blood test strip 1 each by Other route as needed. Use as instructed 100 each 0   insulin aspart (NOVOLOG) 100 UNIT/ML injection Inject 5 Units into the skin 3 (three) times daily before meals.      Insulin Syringe-Needle U-100 30G X 5/16" 1 ML MISC 1 each by Does not apply route as directed. 100 each 0   isosorbide mononitrate (IMDUR) 30 MG 24 hr tablet Take 1 tablet (30 mg total) by mouth daily. 90 tablet 1   LEVEMIR FLEXTOUCH 100 UNIT/ML Pen Inject 34 Units into the skin at bedtime.      levothyroxine (SYNTHROID) 88 MCG tablet TAKE 1 TABLET EVERY DAY 90 tablet 0   lisinopril (ZESTRIL) 10 MG tablet Take 1 tablet (10 mg total) by mouth daily. 90 tablet 1   metFORMIN (GLUCOPHAGE) 1000 MG tablet TAKE 1 TABLET TWICE DAILY 180 tablet 1   metoprolol succinate (TOPROL-XL) 50 MG 24 hr tablet Take 1 tablet (50 mg total) by mouth daily. 90 tablet 1   tamsulosin (FLOMAX) 0.4 MG CAPS capsule TAKE 1 CAPSULE AT BEDTIME 90 capsule 0   TRESIBA FLEXTOUCH 200 UNIT/ML SOPN      No facility-administered medications prior to visit.    Allergies  Allergen Reactions   Plavix [Clopidogrel] Hives    ROS Review of Systems  All other systems reviewed and are negative.     Objective:    Physical Exam Vitals and nursing note reviewed.  Constitutional:      General: He is not in acute distress.    Appearance: Normal appearance. He is well-developed and well-groomed.  HENT:     Head:  Normocephalic and atraumatic.     Right Ear: Tympanic membrane, ear canal and external ear normal. There is no impacted cerumen.     Left Ear: Tympanic membrane, ear canal and external ear normal. There is no impacted cerumen.     Nose: Nose normal.  Mouth/Throat:     Lips: Pink.     Mouth: Mucous membranes are moist.     Dentition: Has dentures.     Tongue: No lesions. Tongue does not deviate from midline.     Pharynx: Oropharynx is clear. Uvula midline.  Eyes:     General: Lids are normal. Lids are everted, no foreign bodies appreciated.     Extraocular Movements: Extraocular movements intact.     Conjunctiva/sclera: Conjunctivae normal.     Pupils: Pupils are equal, round, and reactive to light.  Neck:     Thyroid: No thyromegaly or thyroid tenderness.     Vascular: No carotid bruit or JVD.  Cardiovascular:     Rate and Rhythm: Normal rate and regular rhythm.     Pulses: Normal pulses.     Heart sounds: Normal heart sounds, S1 normal and S2 normal.  Pulmonary:     Effort: Pulmonary effort is normal.     Breath sounds: Normal breath sounds.  Abdominal:     General: Abdomen is flat. Bowel sounds are normal. There is no distension or abdominal bruit.     Palpations: There is no hepatomegaly or splenomegaly.     Tenderness: There is no abdominal tenderness. There is no right CVA tenderness, left CVA tenderness, guarding or rebound.     Hernia: No hernia is present.  Musculoskeletal:        General: Normal range of motion.     Cervical back: Normal, full passive range of motion without pain, normal range of motion and neck supple.     Thoracic back: Normal.     Lumbar back: Normal.     Right lower leg: Normal. No edema.     Left lower leg: Normal. No edema.     Right ankle: Normal.     Left ankle: Normal.  Lymphadenopathy:     Head:     Right side of head: No submental, submandibular or tonsillar adenopathy.     Left side of head: No submental, submandibular or tonsillar  adenopathy.     Cervical: No cervical adenopathy.     Upper Body:     Right upper body: No supraclavicular adenopathy.     Left upper body: No supraclavicular adenopathy.  Skin:    General: Skin is warm and dry.     Capillary Refill: Capillary refill takes less than 2 seconds.     Coloration: Skin is not cyanotic, jaundiced or pale.  Neurological:     General: No focal deficit present.     Mental Status: He is alert and oriented to person, place, and time.  Psychiatric:        Attention and Perception: Attention normal.        Mood and Affect: Mood and affect normal.        Speech: Speech normal.        Behavior: Behavior is cooperative.        Thought Content: Thought content normal.        Cognition and Memory: Cognition normal.        Judgment: Judgment normal.     BP 138/70 (BP Location: Left Arm, Patient Position: Sitting, Cuff Size: Large)    Pulse 74    Temp 99.3 F (37.4 C) (Temporal)    Resp 18    Wt 218 lb 9.6 oz (99.2 kg)    SpO2 98%    BMI 25.26 kg/m  Wt Readings from Last 3 Encounters:  09/22/19 218 lb 9.6 oz (  99.2 kg)  09/01/19 214 lb (97.1 kg)  03/19/19 217 lb (98.4 kg)     Health Maintenance Due  Topic Date Due   OPHTHALMOLOGY EXAM  Never done   COVID-19 Vaccine (1) Never done   PNA vac Low Risk Adult (2 of 2 - PCV13) 08/22/2006   TETANUS/TDAP  12/16/2016   INFLUENZA VACCINE  08/10/2019   HEMOGLOBIN A1C  09/19/2019    There are no preventive care reminders to display for this patient.  Lab Results  Component Value Date   TSH 0.94 03/19/2019   Lab Results  Component Value Date   WBC 6.8 03/19/2019   HGB 10.5 (L) 03/19/2019   HCT 32.8 (L) 03/19/2019   MCV 90.9 03/19/2019   PLT 316 03/19/2019   Lab Results  Component Value Date   NA 140 03/19/2019   K 4.8 03/19/2019   CO2 26 03/19/2019   GLUCOSE 119 (H) 03/19/2019   BUN 15 03/19/2019   CREATININE 1.21 (H) 03/19/2019   BILITOT 0.8 03/19/2019   ALKPHOS 81 03/09/2015   AST 15  03/19/2019   ALT 12 03/19/2019   PROT 6.2 03/19/2019   ALBUMIN 3.8 03/09/2015   CALCIUM 9.8 03/19/2019   ANIONGAP 6 09/21/2017   Lab Results  Component Value Date   CHOL 134 03/19/2019   Lab Results  Component Value Date   HDL 45 03/19/2019   Lab Results  Component Value Date   LDLCALC 72 03/19/2019   Lab Results  Component Value Date   TRIG 83 03/19/2019   Lab Results  Component Value Date   CHOLHDL 3.0 03/19/2019   Lab Results  Component Value Date   HGBA1C 7.9 (H) 03/19/2019      Assessment & Plan:   Problem List Items Addressed This Visit      Cardiovascular and Mediastinum   Essential hypertension   Relevant Orders   T4, free   Hemoglobin A1c   COMPLETE METABOLIC PANEL WITH GFR   CBC with Differential/Platelet   Lipid panel   Microalbumin, urine   TSH   PERIPHERAL VASCULAR DISEASE   Relevant Orders   T4, free   Hemoglobin A1c   COMPLETE METABOLIC PANEL WITH GFR   CBC with Differential/Platelet   Lipid panel   Microalbumin, urine   TSH   CAD (coronary artery disease)   Relevant Orders   T4, free   Hemoglobin A1c   COMPLETE METABOLIC PANEL WITH GFR   CBC with Differential/Platelet   Lipid panel   Microalbumin, urine   TSH     Endocrine   Hypothyroidism   Relevant Orders   T4, free   Hemoglobin A1c   COMPLETE METABOLIC PANEL WITH GFR   CBC with Differential/Platelet   Lipid panel   Microalbumin, urine   TSH   Type 2 diabetes mellitus with hyperlipidemia (HCC)   Relevant Orders   T4, free   Hemoglobin A1c   COMPLETE METABOLIC PANEL WITH GFR   CBC with Differential/Platelet   Lipid panel   Microalbumin, urine   TSH   Diabetic neuropathy (HCC)   Relevant Orders   T4, free   Hemoglobin A1c   COMPLETE METABOLIC PANEL WITH GFR   CBC with Differential/Platelet   Lipid panel   Microalbumin, urine   TSH    Other Visit Diagnoses    Follow-up exam, 3-6 months since previous exam    -  Primary   Relevant Orders   T4, free    Hemoglobin A1c   COMPLETE METABOLIC  PANEL WITH GFR   CBC with Differential/Platelet   Lipid panel   Microalbumin, urine   TSH   Flu vaccine need        HTN stable, continue to take home blood pressure log at home 3 hours after taking medication and seeking medical attention for > 140/90. Follow low fat/cholesterol, 2 gram sodium restricted diet with drinking plenty of water daily PVD stable CAD stable Hypothyroidism pending labs DM2 pending labs DM neuropathy stable  Care Gaps/Screen: Vaccines due: Flu today COVID completed in April and March Tdap will complete in 2 weeks PCV 13 will complete in 2 weeks Diabetic opthamology apt will make as is due DM home foot examination compelled to perform with mirror daily  Pending lab results.  No orders of the defined types were placed in this encounter.   Follow-up: Return in about 6 months (around 03/21/2020) for cpe with labs one week prior.    Annie Main, FNP

## 2019-09-23 ENCOUNTER — Other Ambulatory Visit: Payer: Self-pay | Admitting: Nurse Practitioner

## 2019-09-23 DIAGNOSIS — R944 Abnormal results of kidney function studies: Secondary | ICD-10-CM | POA: Insufficient documentation

## 2019-09-23 DIAGNOSIS — E1159 Type 2 diabetes mellitus with other circulatory complications: Secondary | ICD-10-CM

## 2019-09-23 DIAGNOSIS — E785 Hyperlipidemia, unspecified: Secondary | ICD-10-CM

## 2019-09-23 HISTORY — DX: Abnormal results of kidney function studies: R94.4

## 2019-09-23 LAB — CBC WITH DIFFERENTIAL/PLATELET
Absolute Monocytes: 648 cells/uL (ref 200–950)
Basophils Absolute: 43 cells/uL (ref 0–200)
Basophils Relative: 0.6 %
Eosinophils Absolute: 173 cells/uL (ref 15–500)
Eosinophils Relative: 2.4 %
HCT: 36.4 % — ABNORMAL LOW (ref 38.5–50.0)
Hemoglobin: 11.7 g/dL — ABNORMAL LOW (ref 13.2–17.1)
Lymphs Abs: 1735 cells/uL (ref 850–3900)
MCH: 29.3 pg (ref 27.0–33.0)
MCHC: 32.1 g/dL (ref 32.0–36.0)
MCV: 91.2 fL (ref 80.0–100.0)
MPV: 10.7 fL (ref 7.5–12.5)
Monocytes Relative: 9 %
Neutro Abs: 4601 cells/uL (ref 1500–7800)
Neutrophils Relative %: 63.9 %
Platelets: 251 10*3/uL (ref 140–400)
RBC: 3.99 10*6/uL — ABNORMAL LOW (ref 4.20–5.80)
RDW: 14.1 % (ref 11.0–15.0)
Total Lymphocyte: 24.1 %
WBC: 7.2 10*3/uL (ref 3.8–10.8)

## 2019-09-23 LAB — TSH: TSH: 1.17 mIU/L (ref 0.40–4.50)

## 2019-09-23 LAB — LIPID PANEL
Cholesterol: 143 mg/dL (ref <200)
HDL: 40 mg/dL (ref >=40)
LDL Cholesterol (Calc): 83 mg/dL (calc)
Non-HDL Cholesterol (Calc): 103 mg/dL (calc) (ref <130)
Total CHOL/HDL Ratio: 3.6 (calc) (ref <5.0)
Triglycerides: 107 mg/dL (ref <150)

## 2019-09-23 LAB — COMPLETE METABOLIC PANEL WITH GFR
AG Ratio: 1.6 (calc) (ref 1.0–2.5)
ALT: 11 U/L (ref 9–46)
AST: 15 U/L (ref 10–35)
Albumin: 3.8 g/dL (ref 3.6–5.1)
Alkaline phosphatase (APISO): 82 U/L (ref 35–144)
BUN/Creatinine Ratio: 18 (calc) (ref 6–22)
BUN: 25 mg/dL (ref 7–25)
CO2: 26 mmol/L (ref 20–32)
Calcium: 9.6 mg/dL (ref 8.6–10.3)
Chloride: 105 mmol/L (ref 98–110)
Creat: 1.4 mg/dL — ABNORMAL HIGH (ref 0.70–1.11)
GFR, Est African American: 54 mL/min/{1.73_m2} — ABNORMAL LOW (ref >=60)
GFR, Est Non African American: 47 mL/min/{1.73_m2} — ABNORMAL LOW (ref >=60)
Globulin: 2.4 g/dL (calc) (ref 1.9–3.7)
Glucose, Bld: 173 mg/dL — ABNORMAL HIGH (ref 65–99)
Potassium: 4.5 mmol/L (ref 3.5–5.3)
Sodium: 139 mmol/L (ref 135–146)
Total Bilirubin: 0.8 mg/dL (ref 0.2–1.2)
Total Protein: 6.2 g/dL (ref 6.1–8.1)

## 2019-09-23 LAB — MICROALBUMIN, URINE: Microalb, Ur: 1.4 mg/dL

## 2019-09-23 LAB — T4, FREE: Free T4: 1.3 ng/dL (ref 0.8–1.8)

## 2019-09-23 LAB — HEMOGLOBIN A1C
Hgb A1c MFr Bld: 8.8 % of total Hgb — ABNORMAL HIGH (ref <5.7)
Mean Plasma Glucose: 206 (calc)
eAG (mmol/L): 11.4 (calc)

## 2019-09-23 MED ORDER — GLIPIZIDE 5 MG PO TABS: 2.5000 mg | ORAL_TABLET | Freq: Every day | ORAL | 6 refills | Status: DC

## 2019-09-23 NOTE — Progress Notes (Signed)
His A1C is higher. My goal for him is 7.5. I sent in Glipizide at 2.5mg  daily. He will continue all other medications the same. His GFR slightly dept and I suspect is related to DM control. Lets repeat lab in 4 months.

## 2019-10-28 IMAGING — CT CT RENAL STONE PROTOCOL
2 of 4 series · 16 of 46 positions shown, 18 images · non-contrast
Comparison: None.

CLINICAL DATA: Dysuria

EXAM:
CT ABDOMEN AND PELVIS WITHOUT CONTRAST
TECHNIQUE: Multidetector CT imaging of the abdomen and pelvis was performed
following the standard protocol without IV contrast.

[Series 2: axial st · axial · 0.72mm/px · z∈[-526,-91]mm · 13 of 97 slices shown, 15 images]
[im 5/97  soft-tissue]
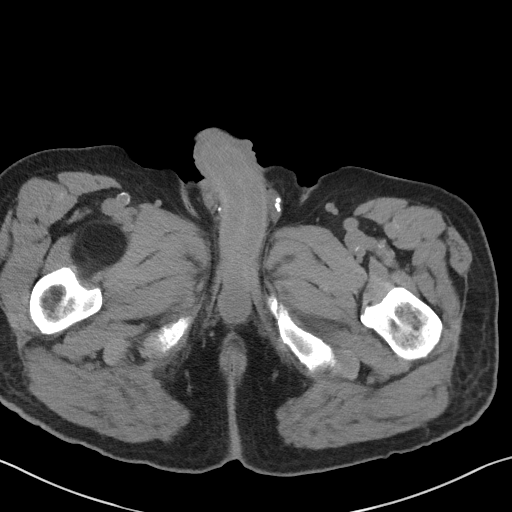
[im 5/97  bone]
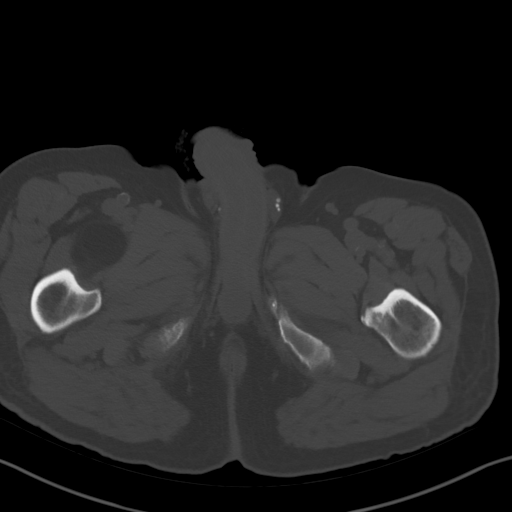
[im 13/97  soft-tissue]
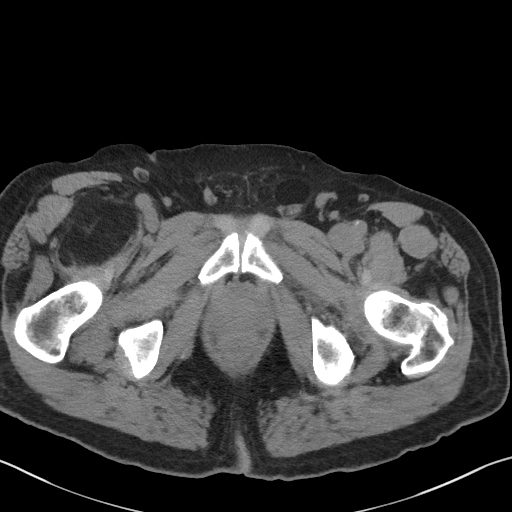
[im 21/97  soft-tissue]
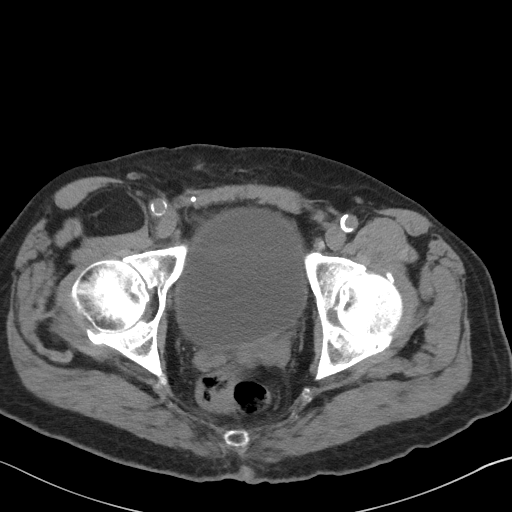
[im 26/97  soft-tissue]
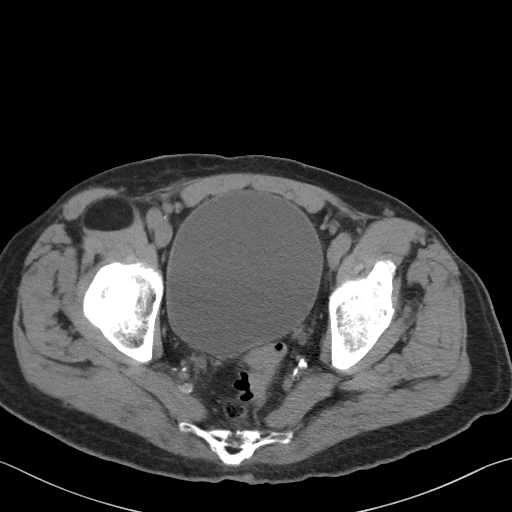
[im 34/97  soft-tissue]
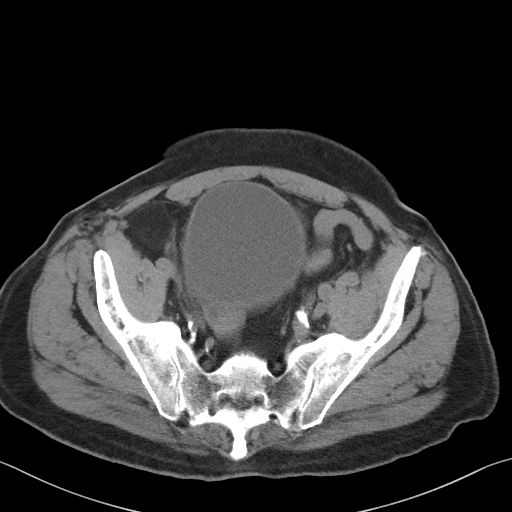
[im 42/97  soft-tissue]
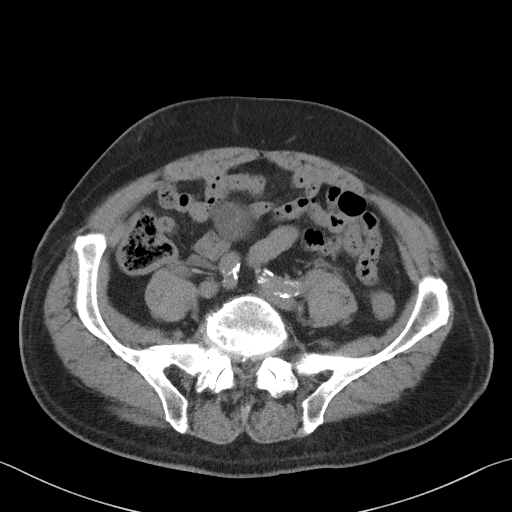
[im 51/97  soft-tissue]
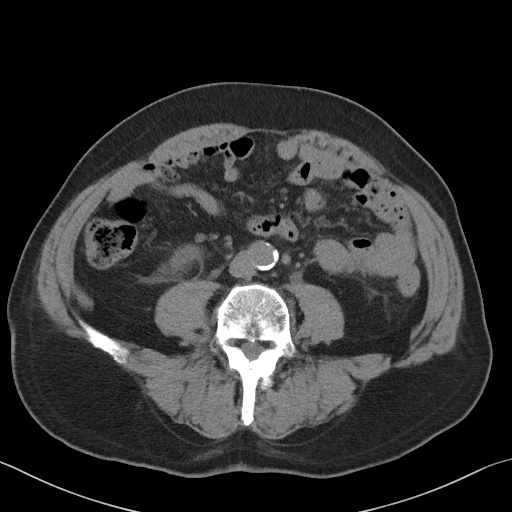
[im 55/97  soft-tissue]
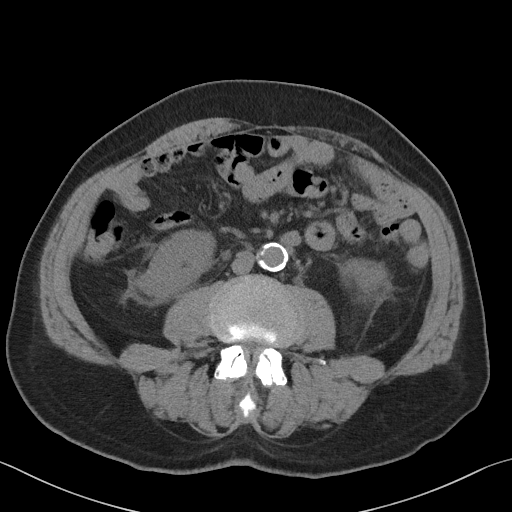
[im 63/97  soft-tissue]
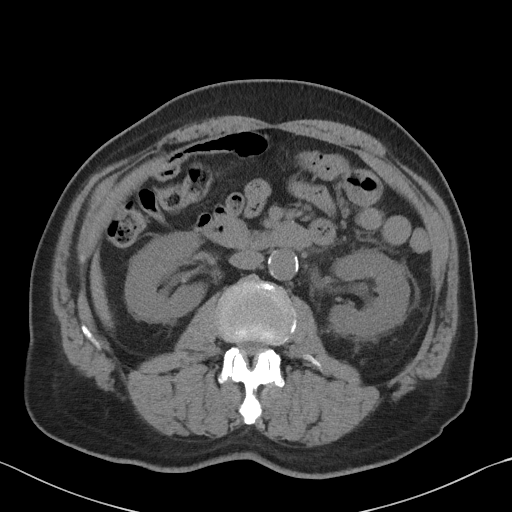
[im 63/97  bone]
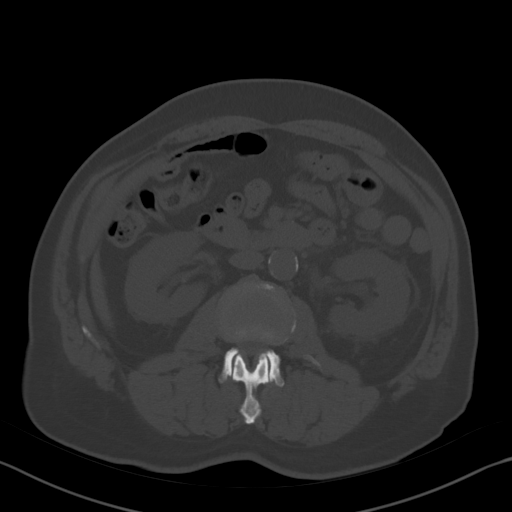
[im 71/97  soft-tissue]
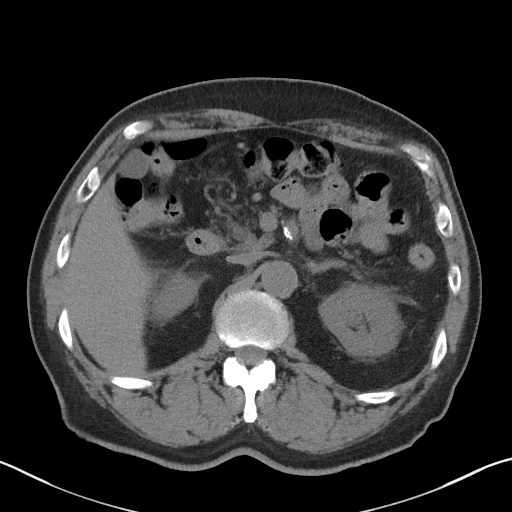
[im 76/97  soft-tissue]
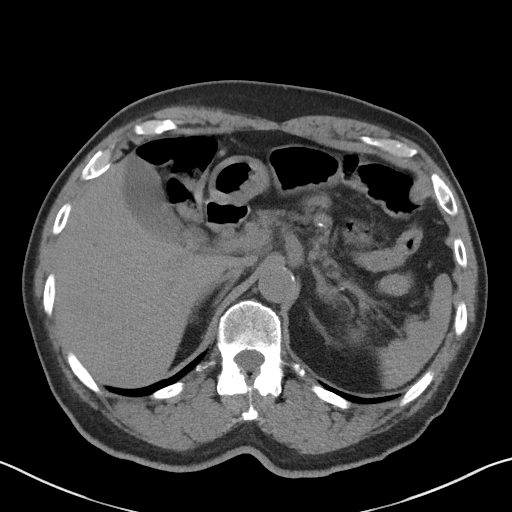
[im 84/97  soft-tissue]
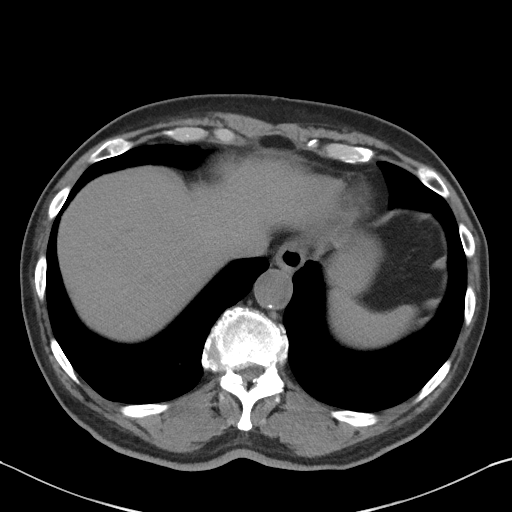
[im 92/97  soft-tissue]
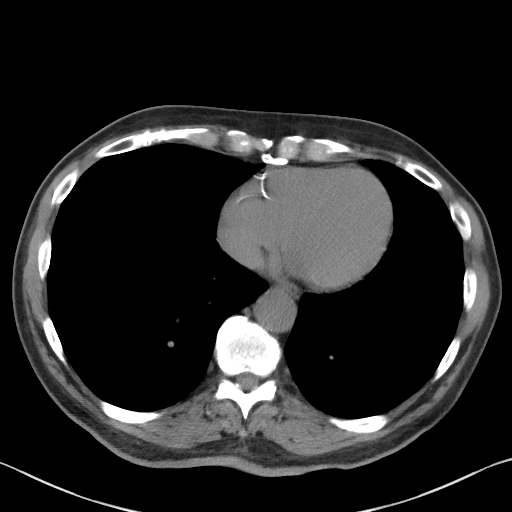

[Series 5: coronal st · coronal · 0.81mm/px · 3 of 95 slices shown]
[im 32/95  soft-tissue]
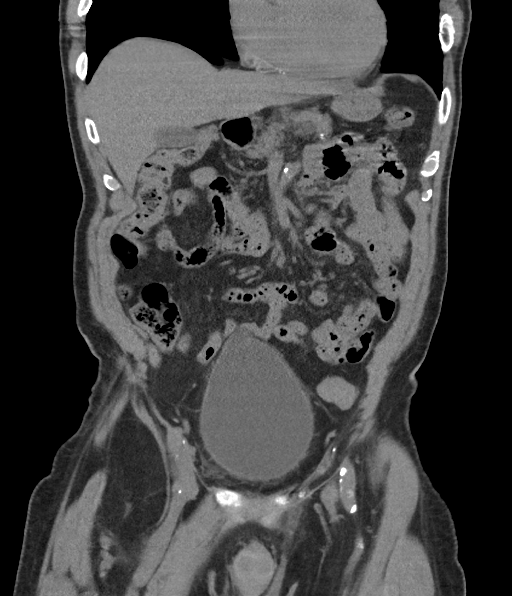
[im 42/95  soft-tissue]
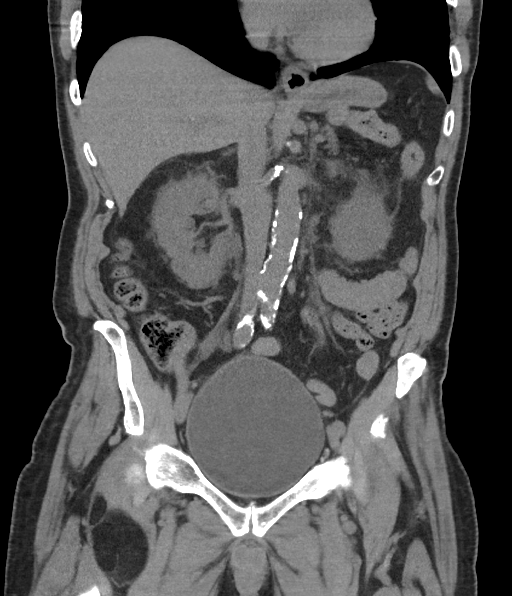
[im 53/95  soft-tissue]
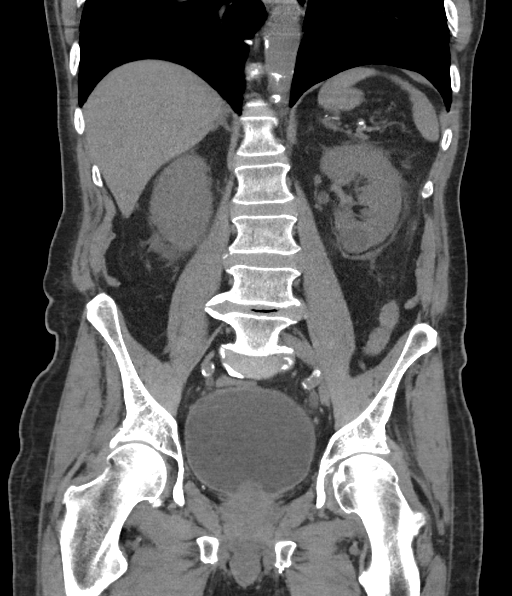

[16 of 46 positions shown; findings below may reference images not displayed]

FINDINGS: Lower chest: Lung bases demonstrate no acute consolidation or
effusion. The heart size is within normal limits. Coronary vascular
calcification

Hepatobiliary: No focal hepatic abnormality or biliary dilatation.
Small calcifications in the right upper quadrant may reflect small
stones at the gallbladder neck. No gallbladder wall inflammation.

Pancreas: No inflammatory change. Diffusely atrophic. Possible 9 mm
low-density cystic lesion at the uncinate process.

Spleen: Normal in size without focal abnormality.

Adrenals/Urinary Tract: Adrenal glands are within normal limits.
Moderate perinephric fat stranding. Mild bilateral hydronephrosis
and hydroureter. Probable cysts within the left kidney. No ureteral
stone. Bladder distention.

Stomach/Bowel: The stomach is nonenlarged. No dilated small bowel.
No colon wall thickening.

Vascular/Lymphatic: Moderate to marked aortic atherosclerosis. No
aneurysm. No significantly enlarged lymph nodes.

Reproductive: Enlarged prostate gland with calcification. Mass
effect on the posterior bladder

Other: No free air or free fluid. Fat within the left inguinal
canal.

Musculoskeletal: Degenerative changes. No fracture or malalignment.
Fat density mass in the right ileus psoas muscle consistent with
lipoma. This measures about 6.6 cm transverse.
IMPRESSION: 1. Moderate perinephric edema bilaterally with mild hydronephrosis
and hydroureter but no ureteral stone. Moderate to marked bladder
distension, question outlet obstruction. There is slight enlargement
of the prostate gland.
2. Possible small stones at the neck of the gallbladder
3. Possible 9 mm low-density cystic lesion at the uncinate process
of the pancreas. When the patient is clinically stable and able to
follow directions and hold their breath (preferably as an
outpatient) further evaluation with dedicated abdominal MRI should
be considered.

## 2020-01-29 ENCOUNTER — Telehealth: Payer: Self-pay

## 2020-01-29 ENCOUNTER — Ambulatory Visit (INDEPENDENT_AMBULATORY_CARE_PROVIDER_SITE_OTHER): Payer: Medicare HMO | Admitting: Nurse Practitioner

## 2020-01-29 ENCOUNTER — Other Ambulatory Visit: Payer: Self-pay

## 2020-01-29 ENCOUNTER — Encounter: Payer: Self-pay | Admitting: Nurse Practitioner

## 2020-01-29 VITALS — BP 160/90 | HR 66 | Resp 16 | Ht 78.0 in | Wt 234.0 lb

## 2020-01-29 DIAGNOSIS — I70213 Atherosclerosis of native arteries of extremities with intermittent claudication, bilateral legs: Secondary | ICD-10-CM

## 2020-01-29 DIAGNOSIS — E1159 Type 2 diabetes mellitus with other circulatory complications: Secondary | ICD-10-CM | POA: Diagnosis not present

## 2020-01-29 DIAGNOSIS — M79602 Pain in left arm: Secondary | ICD-10-CM

## 2020-01-29 DIAGNOSIS — Z87438 Personal history of other diseases of male genital organs: Secondary | ICD-10-CM

## 2020-01-29 DIAGNOSIS — Z794 Long term (current) use of insulin: Secondary | ICD-10-CM

## 2020-01-29 DIAGNOSIS — E039 Hypothyroidism, unspecified: Secondary | ICD-10-CM | POA: Diagnosis not present

## 2020-01-29 DIAGNOSIS — E1169 Type 2 diabetes mellitus with other specified complication: Secondary | ICD-10-CM | POA: Diagnosis not present

## 2020-01-29 DIAGNOSIS — E782 Mixed hyperlipidemia: Secondary | ICD-10-CM

## 2020-01-29 DIAGNOSIS — D649 Anemia, unspecified: Secondary | ICD-10-CM

## 2020-01-29 DIAGNOSIS — E1140 Type 2 diabetes mellitus with diabetic neuropathy, unspecified: Secondary | ICD-10-CM

## 2020-01-29 DIAGNOSIS — I25118 Atherosclerotic heart disease of native coronary artery with other forms of angina pectoris: Secondary | ICD-10-CM

## 2020-01-29 DIAGNOSIS — E1142 Type 2 diabetes mellitus with diabetic polyneuropathy: Secondary | ICD-10-CM | POA: Diagnosis not present

## 2020-01-29 DIAGNOSIS — E785 Hyperlipidemia, unspecified: Secondary | ICD-10-CM

## 2020-01-29 DIAGNOSIS — Z23 Encounter for immunization: Secondary | ICD-10-CM | POA: Diagnosis not present

## 2020-01-29 DIAGNOSIS — I10 Essential (primary) hypertension: Secondary | ICD-10-CM

## 2020-01-29 DIAGNOSIS — R944 Abnormal results of kidney function studies: Secondary | ICD-10-CM

## 2020-01-29 HISTORY — DX: Pain in left arm: M79.602

## 2020-01-29 MED ORDER — LEVOTHYROXINE SODIUM 88 MCG PO TABS: 88.0000 ug | ORAL_TABLET | Freq: Every day | ORAL | 1 refills | Status: DC

## 2020-01-29 MED ORDER — GLIPIZIDE 5 MG PO TABS: 2.5000 mg | ORAL_TABLET | Freq: Every day | ORAL | 1 refills | Status: DC

## 2020-01-29 MED ORDER — METFORMIN HCL 1000 MG PO TABS: 1000.0000 mg | ORAL_TABLET | Freq: Two times a day (BID) | ORAL | 1 refills | Status: DC

## 2020-01-29 MED ORDER — ATORVASTATIN CALCIUM 80 MG PO TABS
80.0000 mg | ORAL_TABLET | Freq: Every day | ORAL | 1 refills | Status: DC
Start: 2020-01-29 — End: 2020-07-16

## 2020-01-29 MED ORDER — AMLODIPINE BESYLATE 5 MG PO TABS: 5.0000 mg | ORAL_TABLET | Freq: Every day | ORAL | 1 refills | Status: DC

## 2020-01-29 MED ORDER — TAMSULOSIN HCL 0.4 MG PO CAPS: 0.4000 mg | ORAL_CAPSULE | Freq: Every day | ORAL | 1 refills | Status: DC

## 2020-01-29 MED ORDER — DICLOFENAC SODIUM 1 % EX GEL: 2.0000 g | Freq: Two times a day (BID) | CUTANEOUS | 1 refills | Status: DC | PRN

## 2020-01-29 MED ORDER — AMLODIPINE BESYLATE 5 MG PO TABS: 5.0000 mg | ORAL_TABLET | Freq: Every day | ORAL | 0 refills | Status: DC

## 2020-01-29 MED ORDER — LISINOPRIL 10 MG PO TABS
10.0000 mg | ORAL_TABLET | Freq: Every day | ORAL | 1 refills | Status: DC
Start: 2020-01-29 — End: 2020-07-27

## 2020-01-29 MED ORDER — GABAPENTIN 300 MG PO CAPS: 300.0000 mg | ORAL_CAPSULE | Freq: Three times a day (TID) | ORAL | 1 refills | Status: DC

## 2020-01-29 MED ORDER — ISOSORBIDE MONONITRATE ER 30 MG PO TB24
30.0000 mg | ORAL_TABLET | Freq: Every day | ORAL | 1 refills | Status: DC
Start: 2020-01-29 — End: 2020-09-08

## 2020-01-29 NOTE — Assessment & Plan Note (Deleted)
Chronic, ongoing.  Last A1c in September 2021 was 8.8%.  Patient has not been checking blood sugars and therefore we do not know what his A1c will look like today, although glipizide was started after the last visit.  Educated patient at length regarding safety issues of not checking blood sugar while taking insulin multiple times per day.  Encouraged patient to check blood sugar 4 times daily and bring this log to next office visit so we know how to adjust medication in future if needed.  Foot exam done today and referral to podiatry made to help with diabetic foot care.  Patient will reach out to ophthalmologist for diabetic eye exam.  Already on atorvastatin 80 mg daily.  A1c checked today.  Refill sent in for metformin 1000 mg twice daily, and glipizide 2.5 mg daily.  Patient reports he has enough insulin and diabetic supplies.  Follow-up in 3 months or sooner pending lab work.

## 2020-01-29 NOTE — Assessment & Plan Note (Addendum)
Chronic, ongoing.  LDL cholesterol in September 2021 showed was 83.  Currently taking Zetia, Brilinta, and atorvastatin.  Continue collaboration with cardiology and vascular surgery for same.  Follow-up in 6 months.

## 2020-01-29 NOTE — Telephone Encounter (Signed)
-----   Message from [name], NP sent at 01/29/2020  3:21 PM EST ----- Records received and reviewed from ophthalmologist - please let patient know his last eye exam was in June 2020 and he needs to schedule a diabetic eye examination check u

## 2020-01-29 NOTE — Assessment & Plan Note (Signed)
Chronic, stable.  Last TSH and T4 in September 2021 was within normal limits.  Will defer rechecking TSH today and continue on levothyroxine 88 mcg daily on the stomach.  Follow-up in 6 months.

## 2020-01-29 NOTE — Progress Notes (Signed)
Made patient aware.

## 2020-01-29 NOTE — Assessment & Plan Note (Addendum)
Acute, ongoing.  Pain started after fall earlier this week after slipping on the ice.  Given reports of pain is gradually improving and no red flags on examination, no crepitus, bruising, and able to perform range of motion, we will treat conservatively for now with Voltaren gel.  Patient encouraged to follow-up if pain worsens or does not continue improving.  May consider imaging at that time.

## 2020-01-29 NOTE — Assessment & Plan Note (Signed)
Noted on most recent labs.  Question if there is a component of chronic kidney disease related to diabetes or -we will recheck kidney function today.  May consider renally dosing medications in future.

## 2020-01-29 NOTE — Assessment & Plan Note (Signed)
Chronic, ongoing.  Last A1c in September 2021 was 8.8%.  Patient has not been checking blood sugars and therefore we do not know what his A1c will look like today, although glipizide was started after the last visit.  Educated patient at length regarding safety issues of not checking blood sugar while taking insulin multiple times per day.  Encouraged patient to check blood sugar 4 times daily and bring this log to next office visit so we know how to adjust medication in future if needed.  Foot exam done today and referral to podiatry made to help with diabetic foot care.  Patient will reach out to ophthalmologist for diabetic eye exam.  Already on atorvastatin 80 mg daily.  A1c checked today.  Refill sent in for metformin 1000 mg twice daily, and glipizide 2.5 mg daily.  Patient reports he has enough insulin and diabetic supplies.  Follow-up in 3 months or sooner pending lab work. 

## 2020-01-29 NOTE — Patient Instructions (Signed)
Blood Glucose Monitoring, Adult Monitoring your blood sugar (glucose) is an important part of managing your diabetes. Blood glucose monitoring involves checking your blood glucose as often as directed and keeping a log or record of your results over time. Checking your blood glucose regularly and keeping a blood glucose log can:  Help you and your health care provider adjust your diabetes management plan as needed, including your medicines or insulin.  Help you understand how food, exercise, illnesses, and medicines affect your blood glucose.  Let you know what your blood glucose is at any time. You can quickly find out if you have low blood glucose (hypoglycemia) or high blood glucose (hyperglycemia). Your health care provider will set individualized treatment goals for you. Your goals will be based on your age, other medical conditions you have, and how you respond to diabetes treatment. Generally, the goal of treatment is to maintain the following blood glucose levels:  Before meals (preprandial): 80-130 mg/dL (4.4-7.2 mmol/L).  After meals (postprandial): below 180 mg/dL (10 mmol/L).  A1C level: less than 7%. Supplies needed:  Blood glucose meter.  Test strips for your meter. Each meter has its own strips. You must use the strips that came with your meter.  A needle to prick your finger (lancet). Do not use a lancet more than one time.  A device that holds the lancet (lancing device).  A journal or log book to write down your results. How to check your blood glucose Checking your blood glucose 1. Wash your hands for at least 20 seconds with soap and water. 2. Prick the side of your finger (not the tip) with the lancet. Do not use the same finger consecutively. 3. Gently rub the finger until a small drop of blood appears. 4. Follow instructions that come with your meter for inserting the test strip, applying blood to the strip, and using your blood glucose meter. 5. Write down  your result and any notes in your log.   Using alternative sites Some meters allow you to use areas of your body other than your finger (alternative sites) to test your blood. The most common alternative sites are the forearm, the thigh, and the palm of your hand. Alternative sites may not be as accurate as the fingers because blood flow is slower in those areas. This means that the result you get may be delayed, and it may be different from the result that you would get from your finger. Use the finger only, and do not use alternative sites, if:  You think you have hypoglycemia.  You sometimes do not know that your blood glucose is getting low (hypoglycemia unawareness). General tips and recommendations Blood glucose log  Every time you check your blood glucose, write down your result. Also write down any notes about things that may be affecting your blood glucose, such as your diet and exercise for the day. This information can help you and your health care provider: ? Look for patterns in your blood glucose over time. ? Adjust your diabetes management plan as needed.  Check if your meter allows you to download your records to a computer or if there is an app for the meter. Most glucose meters store a record of glucose readings in the meter.   If you have type 1 diabetes:  Check your blood glucose 4 or more times a day if you are on intensive insulin therapy with multiple daily injections (MDI) or if you are using an insulin pump. Check your  blood glucose: ? Before every meal and snack. ? Before bedtime.  Also check your blood glucose: ? If you have symptoms of hypoglycemia. ? After treating low blood glucose. ? Before doing activities that create a risk for injury, like driving or using machinery. ? Before and after exercise. ? Two hours after a meal. ? Occasionally between 2:00 a.m. and 3:00 a.m., as directed.  You may need to check your blood glucose more often, 6-10 times per  day, if: ? You have diabetes that is not well controlled. ? You are ill. ? You have a history of severe hypoglycemia. ? You have hypoglycemia unawareness. If you have type 2 diabetes:  Check your blood glucose 2 or more times a day if you take insulin or other diabetes medicines.  Check your blood glucose 4 or more times a day if you are on intensive insulin therapy. Occasionally, you may also need to check your glucose between 2:00 a.m. and 3:00 a.m., as directed.  Also check your blood glucose: ? Before and after exercise. ? Before doing activities that create a risk for injury, like driving or using machinery.  You may need to check your blood glucose more often if: ? Your medicine is being adjusted. ? Your diabetes is not well controlled. ? You are ill. General tips  Make sure you always have your supplies with you.  After you use a few boxes of test strips, adjust (calibrate) your blood glucose meter by following instructions that came with your meter.  If you have questions or need help, all blood glucose meters have a 24-hour hotline phone number available that you can call. Also contact your health care provider with questions or concerns you may have. Where to find more information  The American Diabetes Association: www.diabetes.org  The Association of Diabetes Care & Education Specialists: www.diabeteseducator.org Contact a health care provider if:  Your blood glucose is at or above 240 mg/dL (13.3 mmol/L) for 2 days in a row.  You have been sick or have had a fever for 2 days or longer, and you are not getting better.  You have any of the following problems for more than 6 hours: ? You cannot eat or drink. ? You have nausea or vomiting. ? You have diarrhea. Get help right away if:  Your blood glucose is lower than 54 mg/dL (3 mmol/L).  You become confused, or you have trouble thinking clearly.  You have difficulty breathing.  You have moderate or large  ketone levels in your urine. These symptoms may represent a serious problem that is an emergency. Do not wait to see if the symptoms will go away. Get medical help right away. Call your local emergency services (911 in the [place] Do not drive yourself to the hospital. Summary  Monitoring your blood glucose is an important part of managing your diabetes.  Blood glucose monitoring involves checking your blood glucose as often as directed and keeping a log or record of your results over time.  Your health care provider will set individualized treatment goals for you. Your goals will be based on your age, other medical conditions you have, and how you respond to diabetes treatment.  Every time you check your blood glucose, write down your result. Also, write down any notes about things that may be affecting your blood glucose, such as your diet and exercise for the day. This information is not intended to replace advice given to you by your health care provider. Make sure  you discuss any questions you have with your health care provider. Document Revised: 09/24/2019 Document Reviewed: 09/24/2019 Elsevier Patient Education  2021 [employer].

## 2020-01-29 NOTE — Assessment & Plan Note (Signed)
Chronic, ongoing.  Blood pressure elevated above goal of 140/90 today in office.  Will not make any medication changes as patient reports he has been out of his amlodipine for a few days.  Patient does not check his blood pressure at home currently.  Refill of amlodipine and lisinopril given.  We will check BMET for kidney function and electrolytes today and follow-up pending results.

## 2020-01-29 NOTE — Telephone Encounter (Signed)
Spoke with patient and made him aware that he is past due for a diabetic eye exam. He verbalized understanding.

## 2020-01-29 NOTE — Assessment & Plan Note (Signed)
Chronic, stable.  Patient is on Brilinta 60 mg, Zetia 10 mg, and atorvastatin 80 mg daily.  Continue these medications as previously prescribed by cardiology.  Continue collaboration with cardiology and vascular surgery.  With any new chest pain or shortness of breath, go to ED.

## 2020-01-29 NOTE — Assessment & Plan Note (Addendum)
Chronic, ongoing.  Given reports of increased neuropathy, will trial increase of gabapentin 300 mg to 3 times daily as needed for neuropathy.  Diabetic foot exam done today and referral made to podiatry.  No current diabetic foot ulcers, however skin is flaky and toenails are thick and long.  Follow-up in 3 months.
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
## 2020-01-29 NOTE — Assessment & Plan Note (Addendum)
Chronic, ongoing.  Last A1c in September 2021 was 8.8%.  Patient has not been checking blood sugars and therefore we do not know what his A1c will look like today, although glipizide was started after the last visit.  Educated patient at length regarding safety issues of not checking blood sugar while taking insulin multiple times per day.  Encouraged patient to check blood sugar 4 times daily and bring this log to next office visit so we know how to adjust medication in future if needed.  Foot exam done today and referral to podiatry made to help with diabetic foot care.  Patient will reach out to ophthalmologist for diabetic eye exam.  Already on atorvastatin 80 mg daily.  A1c checked today.  Refill sent in for metformin 1000 mg twice daily, and glipizide 2.5 mg daily.  Patient reports he has enough insulin and diabetic supplies.  Follow-up in 3 months or sooner pending lab work. 

## 2020-01-29 NOTE — Assessment & Plan Note (Signed)
Chronic, stable.  Denies any leg or calf pain today.  With mild pitting edema of right greater than left lower extremity, encourage patient to take as needed Lasix as prescribed previously by Dr. Chancy Milroy per report.  Continue collaboration with cardiology and vascular surgery.  Patient is aware to go to the ED with any new chest pain or shortness of breath.

## 2020-02-03 ENCOUNTER — Telehealth: Payer: Self-pay | Admitting: Nurse Practitioner

## 2020-02-03 LAB — CBC WITH DIFFERENTIAL/PLATELET
Absolute Monocytes: 566 cells/uL (ref 200–950)
Basophils Absolute: 41 cells/uL (ref 0–200)
Basophils Relative: 0.7 %
Eosinophils Absolute: 148 cells/uL (ref 15–500)
Eosinophils Relative: 2.5 %
HCT: 38.1 % — ABNORMAL LOW (ref 38.5–50.0)
Hemoglobin: 12.5 g/dL — ABNORMAL LOW (ref 13.2–17.1)
Lymphs Abs: 1363 cells/uL (ref 850–3900)
MCH: 30.5 pg (ref 27.0–33.0)
MCHC: 32.8 g/dL (ref 32.0–36.0)
MCV: 92.9 fL (ref 80.0–100.0)
MPV: 10.2 fL (ref 7.5–12.5)
Monocytes Relative: 9.6 %
Neutro Abs: 3782 cells/uL (ref 1500–7800)
Neutrophils Relative %: 64.1 %
Platelets: 241 10*3/uL (ref 140–400)
RBC: 4.1 10*6/uL — ABNORMAL LOW (ref 4.20–5.80)
RDW: 13.2 % (ref 11.0–15.0)
Total Lymphocyte: 23.1 %
WBC: 5.9 10*3/uL (ref 3.8–10.8)

## 2020-02-03 LAB — BASIC METABOLIC PANEL
BUN/Creatinine Ratio: 13 (calc) (ref 6–22)
BUN: 17 mg/dL (ref 7–25)
CO2: 26 mmol/L (ref 20–32)
Calcium: 9.7 mg/dL (ref 8.6–10.3)
Chloride: 104 mmol/L (ref 98–110)
Creat: 1.3 mg/dL — ABNORMAL HIGH (ref 0.70–1.11)
Glucose, Bld: 192 mg/dL — ABNORMAL HIGH (ref 65–99)
Potassium: 4.8 mmol/L (ref 3.5–5.3)
Sodium: 138 mmol/L (ref 135–146)

## 2020-02-03 LAB — FOLATE: Folate: 10.3 ng/mL

## 2020-02-03 LAB — TEST AUTHORIZATION 2

## 2020-02-03 LAB — IRON,TIBC AND FERRITIN PANEL
%SAT: 40 % (calc) (ref 20–48)
Ferritin: 68 ng/mL (ref 24–380)
Iron: 106 ug/dL (ref 50–180)
TIBC: 264 mcg/dL (calc) (ref 250–425)

## 2020-02-03 LAB — HEMOGLOBIN A1C
Hgb A1c MFr Bld: 8.8 % of total Hgb — ABNORMAL HIGH (ref <5.7)
Mean Plasma Glucose: 206 mg/dL
eAG (mmol/L): 11.4 mmol/L

## 2020-02-03 LAB — VITAMIN B12: Vitamin B-12: 283 pg/mL (ref 200–1100)

## 2020-02-03 NOTE — Addendum Note (Signed)
Addended by: Noemi Chapel A on: 02/03/2020 01:06 PM   Modules accepted: Orders

## 2020-02-03 NOTE — Telephone Encounter (Signed)
Patient returned call to discuss labs results.  Discussed kidney function stable from previous checks when it was slightly decreased.  We will continue to keep a close eye on this.   HgbA1c came back at 8.8% which is exactly the same as 4 months ago.  Educated that this equates to an average blood glucose of more than 200.  Patient reports he has been checking his glucose levels at home and increasing his mealtime insulin.  Reports glucose levels are in the low 100s.  Agreeable to 1 month f/u for diabetes - patient to bring glucose log into appointment.  Blood counts are improved, however patient remains slightly anemic despite replacement with iron supplementation.  Confirmed that he is still taking iron.  Iron levels, folate, and Vitamin B12 are all normal.  Question CKD as cause vs. Anemia of chronic disease.  Will obtain stool testing for blood to rule out GI bleed.

## 2020-02-03 NOTE — Telephone Encounter (Signed)
Called patient and left voicemail to return call to clinic to discuss labs.

## 2020-02-04 ENCOUNTER — Other Ambulatory Visit: Payer: Self-pay

## 2020-02-04 ENCOUNTER — Encounter: Payer: Self-pay | Admitting: Podiatry

## 2020-02-04 ENCOUNTER — Ambulatory Visit: Payer: Medicare HMO | Admitting: Podiatry

## 2020-02-04 DIAGNOSIS — M79674 Pain in right toe(s): Secondary | ICD-10-CM

## 2020-02-04 DIAGNOSIS — L608 Other nail disorders: Secondary | ICD-10-CM

## 2020-02-04 DIAGNOSIS — M79675 Pain in left toe(s): Secondary | ICD-10-CM | POA: Diagnosis not present

## 2020-02-04 DIAGNOSIS — I739 Peripheral vascular disease, unspecified: Secondary | ICD-10-CM | POA: Diagnosis not present

## 2020-02-04 DIAGNOSIS — Z8631 Personal history of diabetic foot ulcer: Secondary | ICD-10-CM | POA: Diagnosis not present

## 2020-02-04 DIAGNOSIS — B351 Tinea unguium: Secondary | ICD-10-CM

## 2020-02-04 DIAGNOSIS — E1159 Type 2 diabetes mellitus with other circulatory complications: Secondary | ICD-10-CM

## 2020-02-04 DIAGNOSIS — E1142 Type 2 diabetes mellitus with diabetic polyneuropathy: Secondary | ICD-10-CM

## 2020-02-04 MED ORDER — KETOCONAZOLE 2 % EX CREA: 1.0000 "application " | TOPICAL_CREAM | Freq: Every day | CUTANEOUS | 2 refills | Status: DC

## 2020-02-05 NOTE — Progress Notes (Signed)
  Subjective:  Patient ID: Dylan Robles, male    DOB: [birth year],  MRN: [id number]  Chief Complaint  Patient presents with  . Nail Problem  . Diabetes    Nail trim [facility]    80 y.o. male presents with the above complaint. History confirmed with patient.  Here with his wife today.  Is been sometime since he has seen Dr. Prudence Davidson in our office.  Reports no new issues.  His ulcers have healed.  Reports thickened elongated painful toenails which she is unable to cut.  Objective:  Physical Exam: warm, good capillary refill, no trophic changes or ulcerative lesions and normal DP and PT pulses.  Abnormal sensory exam with loss of protective sensation to monofilament.  Onychomycosis x10 with thickened elongated incurvated toenails, pincer nail deformity of the left hallux. Assessment:  No diagnosis found.   Plan:  Patient was evaluated and treated and all questions answered.   Patient educated on diabetes. Discussed proper diabetic foot care and discussed risks and complications of disease. Educated patient in depth on reasons to return to the office immediately should he/she discover anything concerning or new on the feet. All questions answered. Discussed proper shoes as well.   Discussed the etiology and treatment options for the condition in detail with the patient. Educated patient on the topical and oral treatment options for mycotic nails. Recommended debridement of the nails today. Sharp and mechanical debridement performed of all painful and mycotic nails today. Nails debrided in length and thickness using a nail nipper to level of comfort. Discussed treatment options including appropriate shoe gear. Follow up as needed for painful nails.    Return in about 3 months (around 05/04/2020) for at risk diabetic foot care.

## 2020-02-06 ENCOUNTER — Telehealth: Payer: Self-pay

## 2020-02-06 NOTE — Telephone Encounter (Signed)
Received records for pt recent eye exam done on 06/18/18. Records have been reviewed by NP. Most recent note states pt is aware that it is time for him to have follow up appt. Records have been sent to scan

## 2020-02-11 ENCOUNTER — Other Ambulatory Visit: Payer: Self-pay

## 2020-02-11 ENCOUNTER — Other Ambulatory Visit: Payer: Medicare HMO

## 2020-02-11 DIAGNOSIS — D649 Anemia, unspecified: Secondary | ICD-10-CM

## 2020-02-12 LAB — FECAL GLOBIN BY IMMUNOCHEMISTRY
FECAL GLOBIN RESULT:: NOT DETECTED
MICRO NUMBER:: 11486903
SPECIMEN QUALITY:: ADEQUATE

## 2020-02-12 NOTE — Telephone Encounter (Signed)
Wonderful, thank you

## 2020-02-13 NOTE — Progress Notes (Signed)
Spoke with pt, pleased to hear of result. No further ?'s at this time

## 2020-03-01 ENCOUNTER — Ambulatory Visit (INDEPENDENT_AMBULATORY_CARE_PROVIDER_SITE_OTHER): Payer: Medicare HMO

## 2020-03-01 ENCOUNTER — Encounter (INDEPENDENT_AMBULATORY_CARE_PROVIDER_SITE_OTHER): Payer: Self-pay | Admitting: Vascular Surgery

## 2020-03-01 ENCOUNTER — Ambulatory Visit (INDEPENDENT_AMBULATORY_CARE_PROVIDER_SITE_OTHER): Payer: Medicare HMO | Admitting: Vascular Surgery

## 2020-03-01 ENCOUNTER — Other Ambulatory Visit: Payer: Self-pay

## 2020-03-01 VITALS — BP 122/63 | HR 78 | Resp 16 | Wt 216.6 lb

## 2020-03-01 DIAGNOSIS — E1159 Type 2 diabetes mellitus with other circulatory complications: Secondary | ICD-10-CM

## 2020-03-01 DIAGNOSIS — I10 Essential (primary) hypertension: Secondary | ICD-10-CM

## 2020-03-01 DIAGNOSIS — E782 Mixed hyperlipidemia: Secondary | ICD-10-CM

## 2020-03-01 DIAGNOSIS — I25118 Atherosclerotic heart disease of native coronary artery with other forms of angina pectoris: Secondary | ICD-10-CM

## 2020-03-01 DIAGNOSIS — I70213 Atherosclerosis of native arteries of extremities with intermittent claudication, bilateral legs: Secondary | ICD-10-CM

## 2020-03-01 NOTE — Progress Notes (Signed)
MRN : [id number]  Dylan Robles is a 80 y.o. (05-29-1940) male who presents with chief complaint of No chief complaint on file. [name]  History of Present Illness:   Patient is here forfollow-up noninvasive studies after angiogram on 12/24/2018 due to lower extremity ulceration. The patient underwent angioplasty of his posterior tibial artery and anterior tibial artery.. The patient most recently began seeing wound care as well. There is some report that the patient's wound is healing however slowly. He does endorse that the pain he was having in his lower extremities is greatly decreased. There have been no fever, chills, nausea vomiting or diarrhea following his procedure.  ABI's Rt=0.85 and Lt=0.97 (previousABI Rt=0.73 and Lt=0.70)  Previous left lower extremity arterial duplex reveals biphasic/triphasic waveforms down to the level of the tibial arteries(stent is patent)where it transitions to monophasic flow throughout the calf vessels, essentially unchanged compared to last study.  No outpatient medications have been marked as taking for the 03/01/20 encounter (Appointment) with Delana Meyer, Dolores Lory, MD.    Past Medical History:  Diagnosis Date  . Acute lower UTI 09/20/2017  . Bradycardia on ECG 07/31/2011  . Coronary artery disease    hyperlipidemia  . Dehydration 09/21/2017  . Diabetes mellitus   . Diabetes mellitus without complication ([place])   . Hypercholesterolemia   . Hypertension   . Hypothyroidism   . Paronychia of great toe of left foot 09/26/2018  . Paronychia of great toe, left 09/26/2018  . PERIPHERAL NEUROPATHY 01/13/2006   Qualifier: Diagnosis of  By: Truett Mainland MD, Christine    . Shortness of breath dyspnea    with exertion  . Vasovagal syncope   . Weakness 09/20/2017    Past Surgical History:  Procedure Laterality Date  . APPENDECTOMY    . CARDIAC CATHETERIZATION N/A 06/16/2014   Procedure: Left Heart Cath;  Surgeon: Dionisio David, MD;  Location: [facility];  Service: Cardiovascular;  Laterality: N/A;  . CIRCUMCISION, NON-NEWBORN    . HERNIA REPAIR    . LOWER EXTREMITY ANGIOGRAPHY Left 12/24/2018   Procedure: LOWER EXTREMITY ANGIOGRAPHY;  Surgeon: Katha Cabal, MD;  Location: [facility];  Service: Cardiovascular;  Laterality: Left;  . THYROID SURGERY      Social History Social History   Tobacco Use  . Smoking status: Former Research scientist (life sciences)  . Smokeless tobacco: Never Used  Substance Use Topics  . Alcohol use: No  . Drug use: No    Family History Family History  Problem Relation Age of Onset  . Diabetes Father   . Hypertension Father   . Cancer Mother   . Diabetes Brother     Allergies  Allergen Reactions  . Plavix [Clopidogrel] Hives     REVIEW OF SYSTEMS (Negative unless checked)  Constitutional: [] Weight loss  [] Fever  [] Chills Cardiac: [] Chest pain   [] Chest pressure   [] Palpitations   [] Shortness of breath when laying flat   [] Shortness of breath with exertion. Vascular:  [x] Pain in legs with walking   [] Pain in legs at rest  [] History of DVT   [] Phlebitis   [] Swelling in legs   [] Varicose veins   [] Non-healing ulcers Pulmonary:   [] Uses home oxygen   [] Productive cough   [] Hemoptysis   [] Wheeze  [] COPD   [] Asthma Neurologic:  [] Dizziness   [] Seizures   [] History of stroke   [] History of TIA  [] Aphasia   [] Vissual changes   [] Weakness or numbness in arm   [x] Weakness or numbness in  leg Musculoskeletal:   [] Joint swelling   [x] Joint pain   [x] Low back pain Hematologic:  [] Easy bruising  [] Easy bleeding   [] Hypercoagulable state   [] Anemic Gastrointestinal:  [] Diarrhea   [] Vomiting  [] Gastroesophageal reflux/heartburn   [] Difficulty swallowing. Genitourinary:  [] Chronic kidney disease   [] Difficult urination  [] Frequent urination   [] Blood in urine Skin:  [] Rashes   [] Ulcers  Psychological:  [] History of anxiety   []  History of major depression.  Physical Examination  There were no vitals filed for this  visit. There is no height or weight on file to calculate BMI. Gen: WD/WN, NAD Head: [place]/AT, No temporalis wasting.  Ear/Nose/Throat: Hearing grossly intact, nares w/o erythema or drainage Eyes: PER, EOMI, sclera nonicteric.  Neck: Supple, no large masses.   Pulmonary:  Good air movement, no audible wheezing bilaterally, no use of accessory muscles.  Cardiac: RRR, no JVD Vascular:  Vessel Right Left  Radial Palpable Palpable  PT Not Palpable Not Palpable  DP Not Palpable Not Palpable  Gastrointestinal: Non-distended. No guarding/no peritoneal signs.  Musculoskeletal: M/S 5/5 throughout.  No deformity or atrophy.  Neurologic: CN 2-12 intact. Symmetrical.  Speech is fluent. Motor exam as listed above. Psychiatric: Judgment intact, Mood & affect appropriate for pt's clinical situation. Dermatologic: No rashes or ulcers noted.  No changes consistent with cellulitis.  CBC Lab Results  Component Value Date   WBC 5.9 01/29/2020   HGB 12.5 (L) 01/29/2020   HCT 38.1 (L) 01/29/2020   MCV 92.9 01/29/2020   PLT 241 01/29/2020    BMET    Component Value Date/Time   NA 138 01/29/2020 1106   K 4.8 01/29/2020 1106   CL 104 01/29/2020 1106   CO2 26 01/29/2020 1106   GLUCOSE 192 (H) 01/29/2020 1106   BUN 17 01/29/2020 1106   CREATININE 1.30 (H) 01/29/2020 1106   CALCIUM 9.7 01/29/2020 1106   GFRNONAA 47 (L) 09/22/2019 1613   GFRAA 54 (L) 09/22/2019 1613   CrCl cannot be calculated (Patient's most recent lab result is older than the maximum 21 days allowed.).  COAG No results found for: INR, PROTIME  Radiology No results found.   Assessment/Plan 1. Atherosclerosis of native artery of both lower extremities with intermittent claudication (HCC)  Recommend:  The patient has evidence of atherosclerosis of the lower extremities with claudication.  The patient does not voice lifestyle limiting changes at this point in time.  Noninvasive studies do not suggest clinically significant  change.  No invasive studies, angiography or surgery at this time The patient should continue walking and begin a more formal exercise program.  The patient should continue antiplatelet therapy and aggressive treatment of the lipid abnormalities  No changes in the patient's medications at this time  The patient should continue wearing graduated compression socks 10-15 mmHg strength to control the mild edema.   - VAS [place] ABI WITH/WO TBI; Future  2. Essential hypertension Continue antihypertensive medications as already ordered, these medications have been reviewed and there are no changes at this time.   3. Coronary artery disease of native artery of native heart with stable angina pectoris (HCC) Continue cardiac and antihypertensive medications as already ordered and reviewed, no changes at this time.  Continue statin as ordered and reviewed, no changes at this time  Nitrates PRN for chest pain   4. Type 2 diabetes mellitus with vascular disease ([place]) Continue hypoglycemic medications as already ordered, these medications have been reviewed and there are no changes at this time.  Hgb A1C to be monitored as already arranged by primary service   5. Mixed hyperlipidemia Continue statin as ordered and reviewed, no changes at this time   Hortencia Pilar, MD  03/01/2020 2:40 PM

## 2020-03-03 ENCOUNTER — Other Ambulatory Visit: Payer: Self-pay

## 2020-03-03 ENCOUNTER — Ambulatory Visit (INDEPENDENT_AMBULATORY_CARE_PROVIDER_SITE_OTHER): Payer: Medicare HMO | Admitting: Nurse Practitioner

## 2020-03-03 ENCOUNTER — Encounter: Payer: Self-pay | Admitting: Nurse Practitioner

## 2020-03-03 VITALS — BP 120/80 | HR 75 | Temp 97.5°F | Ht 78.0 in | Wt 234.0 lb

## 2020-03-03 DIAGNOSIS — E1169 Type 2 diabetes mellitus with other specified complication: Secondary | ICD-10-CM | POA: Diagnosis not present

## 2020-03-03 DIAGNOSIS — N1831 Chronic kidney disease, stage 3a: Secondary | ICD-10-CM | POA: Insufficient documentation

## 2020-03-03 DIAGNOSIS — E785 Hyperlipidemia, unspecified: Secondary | ICD-10-CM

## 2020-03-03 NOTE — Patient Instructions (Signed)
F/u in 2 months

## 2020-03-03 NOTE — Assessment & Plan Note (Signed)
Chronic, ongoing.  Fasting blood glucoses appear to be trending down; most recently between 90-170 which is acceptable for age.  Encouraged continued close monitoring of blood sugar at home and follow up in 2 months for repeat blood work.  Continue current medications as prescribed.

## 2020-03-03 NOTE — Assessment & Plan Note (Signed)
Chronic, ongoing.  Medication list reviewed - nephrotoxic agents minimized and maximum gabapentin will be 1800 mg/day.  Repeat CMP in 2 months to monitor.  Maximize control of diabetes and blood pressure to prevent further injury.  Consider renal ultrasound and nephrology evaluation in future.

## 2020-03-03 NOTE — Progress Notes (Signed)
Subjective:    Patient ID: Dylan Robles, male    DOB: 1940-07-05, 80 y.o.   MRN: [id number]  HPI: [name] is a 80 y.o. male presenting for follow up.  Chief Complaint  Patient presents with  . Diabetes    Follow up   DIABETES Hypoglycemic episodes:no Polydipsia/polyuria: no Visual disturbance: no Chest pain: no Paresthesias: no Glucose Monitoring: yes  Accucheck frequency: daily  Fasting glucose: 150s Taking Insulin?: yes  Long acting insulin: 28 units at night time  Short acting insulin: 6 units twice daily Blood Pressure Monitoring: not checking Retinal Examination: Not up to Date Foot Exam: Up to Date Diabetic Education: Completed Pneumovax: Up to Date Influenza: Up to Date Aspirin: yes   CHRONIC KIDNEY DISEASE CKD status: stable Medications renally dose: yes Previous renal evaluation: no Pneumovax:  Up to Date Influenza Vaccine:  Up to Date   Allergies  Allergen Reactions  . Plavix [Clopidogrel] Hives    Outpatient Encounter Medications as of 03/03/2020  Medication Sig  . amLODipine (NORVASC) 5 MG tablet Take 1 tablet (5 mg total) by mouth daily.  [name] aspirin EC 81 MG tablet Take 81 mg by mouth daily.   [name] atorvastatin (LIPITOR) 80 MG tablet Take 1 tablet (80 mg total) by mouth at bedtime.  . Blood Glucose Calibration (TRUE METRIX LEVEL 1) Low SOLN 1 each by In Vitro route as directed.  . Blood Glucose Monitoring Suppl (TRUE METRIX AIR GLUCOSE METER) w/Device KIT 1 each by Does not apply route as directed.  [name] BRILINTA 60 MG TABS tablet Take 60 mg by mouth 2 (two) times daily.   . diclofenac Sodium (VOLTAREN) 1 % GEL Apply 2 g topically 2 (two) times daily as needed.  . DROPLET INSULIN SYRINGE 30G X 5/16" 1 ML MISC USE AS DIRECTED  . ezetimibe (ZETIA) 10 MG tablet Take 10 mg by mouth daily.  . furosemide (LASIX) 20 MG tablet Take 20 mg by mouth as needed.  . gabapentin (NEURONTIN) 300 MG capsule Take 1 capsule (300 mg total) by mouth 3 (three) times  daily.  [name] glipiZIDE (GLUCOTROL) 5 MG tablet Take 0.5 tablets (2.5 mg total) by mouth daily before breakfast.  . glucose blood test strip 1 each by Other route as needed. Use as instructed  . insulin aspart (NOVOLOG) 100 UNIT/ML injection Inject 5 Units into the skin 3 (three) times daily before meals.   . isosorbide mononitrate (IMDUR) 30 MG 24 hr tablet Take 1 tablet (30 mg total) by mouth daily.  [name] ketoconazole (NIZORAL) 2 % cream Apply 1 application topically daily.  [name] LEVEMIR FLEXTOUCH 100 UNIT/ML Pen Inject 34 Units into the skin at bedtime.   [name] levothyroxine (SYNTHROID) 88 MCG tablet Take 1 tablet (88 mcg total) by mouth daily before breakfast.  . lisinopril (ZESTRIL) 10 MG tablet Take 1 tablet (10 mg total) by mouth daily.  . metFORMIN (GLUCOPHAGE) 1000 MG tablet Take 1 tablet (1,000 mg total) by mouth 2 (two) times daily with a meal.  . metoprolol succinate (TOPROL-XL) 50 MG 24 hr tablet Take 1 tablet (50 mg total) by mouth daily.  . tamsulosin (FLOMAX) 0.4 MG CAPS capsule Take 1 capsule (0.4 mg total) by mouth at bedtime.   No facility-administered encounter medications on file as of 03/03/2020.    Patient Active Problem List   Diagnosis Date Noted  . Stage 3a chronic kidney disease ([place]) 03/03/2020  . Left arm pain 01/29/2020  . Decreased GFR 09/23/2019  .  Atherosclerosis of native arteries of extremity with intermittent claudication ([place]) 12/19/2018  . Hyperlipidemia 12/19/2018  . Diabetic neuropathy ([place]) 09/26/2018  . Type 2 diabetes mellitus with vascular disease ([place]) 09/26/2018  . Coronary artery disease of native artery of native heart with stable angina pectoris ([place]) 09/20/2017  . Type 2 diabetes mellitus with hyperlipidemia ([place]) 09/16/2008  . Hypothyroidism 03/29/2006  . Essential hypertension 01/13/2006  . PERIPHERAL VASCULAR DISEASE 01/13/2006    Past Medical History:  Diagnosis Date  . Acute lower UTI 09/20/2017  . Bradycardia on ECG 07/31/2011  . Coronary  artery disease    hyperlipidemia  . Dehydration 09/21/2017  . Diabetes mellitus   . Diabetes mellitus without complication ([place])   . Hypercholesterolemia   . Hypertension   . Hypothyroidism   . Paronychia of great toe of left foot 09/26/2018  . Paronychia of great toe, left 09/26/2018  . PERIPHERAL NEUROPATHY 01/13/2006   Qualifier: Diagnosis of  By: Truett Mainland MD, Christine    . Shortness of breath dyspnea    with exertion  . Vasovagal syncope   . Weakness 09/20/2017    Relevant past medical, surgical, family and social history reviewed and updated as indicated. Interim medical history since our last visit reviewed.  Review of Systems Per HPI unless specifically indicated above     Objective:    BP 120/80   Pulse 75   Temp (!) 97.5 F (36.4 C)   Ht $R'6\' 6"'xO$  (1.981 m)   Wt 234 lb (106.1 kg)   SpO2 97%   BMI 27.04 kg/m   Wt Readings from Last 3 Encounters:  03/03/20 234 lb (106.1 kg)  03/01/20 216 lb 9.6 oz (98.2 kg)  01/29/20 234 lb (106.1 kg)    Physical Exam Vitals reviewed.  Constitutional:      General: He is not in acute distress.    Appearance: Normal appearance. He is normal weight. He is not toxic-appearing.  HENT:     Head: Normocephalic and atraumatic.  Eyes:     General: No scleral icterus.    Extraocular Movements: Extraocular movements intact.     Comments: Bilateral cataracts noted  Cardiovascular:     Rate and Rhythm: Normal rate and regular rhythm.     Heart sounds: Normal heart sounds. No murmur heard.   Pulmonary:     Effort: Pulmonary effort is normal. No respiratory distress.     Breath sounds: Normal breath sounds. No wheezing, rhonchi or rales.  Abdominal:     General: Abdomen is flat. Bowel sounds are normal. There is no distension.     Palpations: Abdomen is soft.  Musculoskeletal:     Cervical back: Normal range of motion and neck supple.     Right lower leg: No edema.     Left lower leg: No edema.  Lymphadenopathy:     Cervical: No  cervical adenopathy.  Skin:    General: Skin is warm and dry.     Coloration: Skin is not jaundiced or pale.     Findings: No erythema.  Neurological:     General: No focal deficit present.     Mental Status: He is alert and oriented to person, place, and time.     Motor: No weakness.     Gait: Gait (Walks with cane) normal.  Psychiatric:        Mood and Affect: Mood normal.        Behavior: Behavior normal.        Thought Content: Thought content  normal.        Judgment: Judgment normal.     Results for orders placed or performed in visit on 02/11/20  Fecal Globin By Immunochemistry  Result Value Ref Range   MICRO NUMBER: [id number]    SPECIMEN QUALITY: Adequate    Source: STOOL    STATUS: FINAL    FECAL GLOBIN RESULT: Not Detected       Assessment & Plan:   Problem List Items Addressed This Visit      Endocrine   Type 2 diabetes mellitus with hyperlipidemia ([place]) - Primary    Chronic, ongoing.  Fasting blood glucoses appear to be trending down; most recently between 90-170 which is acceptable for age.  Encouraged continued close monitoring of blood sugar at home and follow up in 2 months for repeat blood work.  Continue current medications as prescribed.        Genitourinary   Stage 3a chronic kidney disease (HCC)    Chronic, ongoing.  Medication list reviewed - nephrotoxic agents minimized and maximum gabapentin will be 1800 mg/day.  Repeat CMP in 2 months to monitor.  Maximize control of diabetes and blood pressure to prevent further injury.  Consider renal ultrasound and nephrology evaluation in future.          Follow up plan: Return in about 2 months (around 05/01/2020) for hld, dm f/u.

## 2020-03-23 ENCOUNTER — Ambulatory Visit: Payer: Medicare HMO | Admitting: Family Medicine

## 2020-03-25 ENCOUNTER — Ambulatory Visit: Payer: Medicare HMO | Admitting: Internal Medicine

## 2020-03-31 ENCOUNTER — Ambulatory Visit (HOSPITAL_COMMUNITY)
Admission: RE | Admit: 2020-03-31 | Discharge: 2020-03-31 | Disposition: A | Discharge status: Home / Self Care | Payer: Medicare HMO | Source: Ambulatory Visit | Attending: Nurse Practitioner | Admitting: Nurse Practitioner

## 2020-03-31 ENCOUNTER — Other Ambulatory Visit: Payer: Self-pay

## 2020-03-31 ENCOUNTER — Ambulatory Visit (INDEPENDENT_AMBULATORY_CARE_PROVIDER_SITE_OTHER): Payer: Medicare HMO | Admitting: Nurse Practitioner

## 2020-03-31 ENCOUNTER — Encounter: Payer: Self-pay | Admitting: Nurse Practitioner

## 2020-03-31 VITALS — BP 138/72 | HR 68 | Temp 98.4°F | Ht 78.0 in | Wt 219.6 lb

## 2020-03-31 DIAGNOSIS — R0602 Shortness of breath: Secondary | ICD-10-CM

## 2020-03-31 DIAGNOSIS — R6889 Other general symptoms and signs: Secondary | ICD-10-CM

## 2020-03-31 NOTE — Patient Instructions (Signed)
F/u 1 month for diabetes and weight check

## 2020-03-31 NOTE — Progress Notes (Signed)
Subjective:    Patient ID: Dylan Robles, male    DOB: Aug 03, 1940, 80 y.o.   MRN: [id number]  HPI: Dylan Robles is a 80 y.o. male presenting for "I stay cold all the time."  Chief Complaint  Patient presents with  . Follow-up   COLD INTOLERANCE Onset: months; getting worse Timing: random Aggravating symtpoms: nothing Alleviating symptoms: getting under blanket, going outside if its warm outside Associated symptoms: denies fevers,  Treatments attempted: nothing  He reports he is not eating as much as he used to.  Does pretty good walking; if carrying something, cannot walk for as long.  Quit smoking more than 60 years ago.   BM are "better" sometimes will move 3x in 1 day; sometimes goes 2-3 days without BM.  BMs have not changed in size or shape, no blood, sometimes soft, sometimes has to strain.  DIABETES Hypoglycemic episodes:no Polydipsia/polyuria: no Visual disturbance: no Chest pain: no Paresthesias: yes; sometimes  Glucose Monitoring: randomly  Accucheck frequency: daily  Fasting glucose: 160-170 Blood Pressure Monitoring: not checking Retinal Examination: Up to Date; reports he was recently diagnosed with cataracts and has to have eye surgery. Foot Exam: Up to Date Diabetic Education: Completed Pneumovax: Up to Date Influenza: Up to Date Aspirin: yes  Allergies  Allergen Reactions  . Plavix [Clopidogrel] Hives    Outpatient Encounter Medications as of 03/31/2020  Medication Sig  . amLODipine (NORVASC) 5 MG tablet Take 1 tablet (5 mg total) by mouth daily.  [name] aspirin EC 81 MG tablet Take 81 mg by mouth daily.   [name] atorvastatin (LIPITOR) 80 MG tablet Take 1 tablet (80 mg total) by mouth at bedtime.  . Blood Glucose Calibration (TRUE METRIX LEVEL 1) Low SOLN 1 each by In Vitro route as directed.  . Blood Glucose Monitoring Suppl (TRUE METRIX AIR GLUCOSE METER) w/Device KIT 1 each by Does not apply route as directed.  [name] BRILINTA 60 MG TABS tablet Take 60 mg  by mouth 2 (two) times daily.   . diclofenac Sodium (VOLTAREN) 1 % GEL Apply 2 g topically 2 (two) times daily as needed.  . DROPLET INSULIN SYRINGE 30G X 5/16" 1 ML MISC USE AS DIRECTED  . ezetimibe (ZETIA) 10 MG tablet Take 10 mg by mouth daily.  . furosemide (LASIX) 20 MG tablet Take 20 mg by mouth as needed.  . gabapentin (NEURONTIN) 300 MG capsule Take 1 capsule (300 mg total) by mouth 3 (three) times daily.  [name] glipiZIDE (GLUCOTROL) 5 MG tablet Take 0.5 tablets (2.5 mg total) by mouth daily before breakfast.  . glucose blood test strip 1 each by Other route as needed. Use as instructed  . insulin aspart (NOVOLOG) 100 UNIT/ML injection Inject 5 Units into the skin 3 (three) times daily before meals.   . isosorbide mononitrate (IMDUR) 30 MG 24 hr tablet Take 1 tablet (30 mg total) by mouth daily.  [name] ketoconazole (NIZORAL) 2 % cream Apply 1 application topically daily.  [name] LEVEMIR FLEXTOUCH 100 UNIT/ML Pen Inject 34 Units into the skin at bedtime.   [name] levothyroxine (SYNTHROID) 88 MCG tablet Take 1 tablet (88 mcg total) by mouth daily before breakfast.  . lisinopril (ZESTRIL) 10 MG tablet Take 1 tablet (10 mg total) by mouth daily.  . metFORMIN (GLUCOPHAGE) 1000 MG tablet Take 1 tablet (1,000 mg total) by mouth 2 (two) times daily with a meal.  . metoprolol succinate (TOPROL-XL) 50 MG 24 hr tablet Take 1 tablet (50 mg total)  by mouth daily.  . tamsulosin (FLOMAX) 0.4 MG CAPS capsule Take 1 capsule (0.4 mg total) by mouth at bedtime.   No facility-administered encounter medications on file as of 03/31/2020.    Patient Active Problem List   Diagnosis Date Noted  . Stage 3a chronic kidney disease ([place]) 03/03/2020  . Left arm pain 01/29/2020  . Decreased GFR 09/23/2019  . Atherosclerosis of native arteries of extremity with intermittent claudication ([place]) 12/19/2018  . Hyperlipidemia 12/19/2018  . Diabetic neuropathy ([place]) 09/26/2018  . Type 2 diabetes mellitus with vascular disease ([place])  09/26/2018  . Coronary artery disease of native artery of native heart with stable angina pectoris ([place]) 09/20/2017  . Type 2 diabetes mellitus with hyperlipidemia ([place]) 09/16/2008  . Hypothyroidism 03/29/2006  . Essential hypertension 01/13/2006  . PERIPHERAL VASCULAR DISEASE 01/13/2006    Past Medical History:  Diagnosis Date  . Acute lower UTI 09/20/2017  . Bradycardia on ECG 07/31/2011  . Coronary artery disease    hyperlipidemia  . Dehydration 09/21/2017  . Diabetes mellitus   . Diabetes mellitus without complication ([place])   . Hypercholesterolemia   . Hypertension   . Hypothyroidism   . Paronychia of great toe of left foot 09/26/2018  . Paronychia of great toe, left 09/26/2018  . PERIPHERAL NEUROPATHY 01/13/2006   Qualifier: Diagnosis of  By: Truett Mainland MD, Christine    . Shortness of breath dyspnea    with exertion  . Vasovagal syncope   . Weakness 09/20/2017    Relevant past medical, surgical, family and social history reviewed and updated as indicated. Interim medical history since our last visit reviewed.  Review of Systems  Constitutional: Positive for appetite change (decreased appetite), chills, fatigue and unexpected weight change. Negative for activity change, diaphoresis and fever.  Eyes: Negative.   Respiratory: Positive for shortness of breath. Negative for cough, chest tightness and wheezing.   Cardiovascular: Negative for chest pain and palpitations.  Gastrointestinal: Negative.  Negative for abdominal pain, anal bleeding, blood in stool, diarrhea, nausea, rectal pain and vomiting.  Endocrine: Positive for cold intolerance and polyuria (worse in the morning). Negative for heat intolerance, polydipsia and polyphagia.  Genitourinary: Negative for hematuria.  Neurological: Negative.   Hematological: Negative.  Negative for adenopathy. Does not bruise/bleed easily.  Psychiatric/Behavioral: Negative.     Per HPI unless specifically indicated above     Objective:     BP 138/72   Pulse 68   Temp 98.4 F (36.9 C) (Oral)   Ht $R'6\' 6"'mD$  (1.981 m)   Wt 219 lb 9.6 oz (99.6 kg)   SpO2 98%   BMI 25.38 kg/m   Wt Readings from Last 3 Encounters:  03/31/20 219 lb 9.6 oz (99.6 kg)  03/03/20 234 lb (106.1 kg)  03/01/20 216 lb 9.6 oz (98.2 kg)    Physical Exam Vitals reviewed.  Constitutional:      General: He is not in acute distress.    Appearance: Normal appearance. He is normal weight. He is not toxic-appearing.  HENT:     Head: Normocephalic and atraumatic.  Eyes:     General: No scleral icterus.    Extraocular Movements: Extraocular movements intact.     Comments: Bilateral cataracts noted  Cardiovascular:     Rate and Rhythm: Normal rate and regular rhythm.     Heart sounds: Normal heart sounds. No murmur heard.   Pulmonary:     Effort: Pulmonary effort is normal. No respiratory distress.     Breath sounds: Normal breath  sounds. No wheezing, rhonchi or rales.  Musculoskeletal:     Cervical back: Normal range of motion.     Right lower leg: No edema.     Left lower leg: No edema.  Lymphadenopathy:     Cervical: No cervical adenopathy.  Skin:    General: Skin is warm and dry.     Coloration: Skin is not jaundiced or pale.     Findings: No erythema.  Neurological:     General: No focal deficit present.     Mental Status: He is alert and oriented to person, place, and time.     Motor: No weakness.     Gait: Gait (Walks with cane) normal.  Psychiatric:        Mood and Affect: Mood normal.        Behavior: Behavior normal.        Thought Content: Thought content normal.        Judgment: Judgment normal.       Assessment & Plan:  1. Cold intolerance Reports being more cold than usual.  Is also down around 15 pounds according to our scale however I question the accuracy of our previous reading.  Some concern with decreased appetite.  Will check CBC and TSH today.  Previous CBC in 01/2020 showed slight anemia, however this was improved from  previous and iron panel, vitamin B12, and folate were normal.  Fecal Globulin test was also negative.  We will recheck CBC today along with TSH.  We will follow up in 1 month for diabetes follow-up and to check on weight/symptoms.  - CBC with Differential/Platelet - TSH  2. Shortness of breath Acute.  Quit smoking more than 60 years ago-not a candidate for low-dose lung cancer screening CT.  Lungs clear to auscultation today, however will obtain chest x-ray to rule out infectious etiology.  Oxygenation normal today as well.  Follow-up pending chest x-ray results.  - DG Chest 2 View; Future    Follow up plan: No follow-ups on file.

## 2020-04-01 LAB — CBC WITH DIFFERENTIAL/PLATELET
Absolute Monocytes: 559 cells/uL (ref 200–950)
Basophils Absolute: 39 cells/uL (ref 0–200)
Basophils Relative: 0.6 %
Eosinophils Absolute: 150 cells/uL (ref 15–500)
Eosinophils Relative: 2.3 %
HCT: 40.5 % (ref 38.5–50.0)
Hemoglobin: 12.9 g/dL — ABNORMAL LOW (ref 13.2–17.1)
Lymphs Abs: 1476 cells/uL (ref 850–3900)
MCH: 29.6 pg (ref 27.0–33.0)
MCHC: 31.9 g/dL — ABNORMAL LOW (ref 32.0–36.0)
MCV: 92.9 fL (ref 80.0–100.0)
MPV: 10.4 fL (ref 7.5–12.5)
Monocytes Relative: 8.6 %
Neutro Abs: 4277 cells/uL (ref 1500–7800)
Neutrophils Relative %: 65.8 %
Platelets: 252 10*3/uL (ref 140–400)
RBC: 4.36 10*6/uL (ref 4.20–5.80)
RDW: 13.4 % (ref 11.0–15.0)
Total Lymphocyte: 22.7 %
WBC: 6.5 10*3/uL (ref 3.8–10.8)

## 2020-04-01 LAB — TSH: TSH: 1.75 mIU/L (ref 0.40–4.50)

## 2020-04-28 ENCOUNTER — Ambulatory Visit: Payer: Medicare HMO | Admitting: Nurse Practitioner

## 2020-04-29 ENCOUNTER — Ambulatory Visit (INDEPENDENT_AMBULATORY_CARE_PROVIDER_SITE_OTHER): Payer: Medicare HMO | Admitting: Nurse Practitioner

## 2020-04-29 ENCOUNTER — Encounter: Payer: Self-pay | Admitting: Nurse Practitioner

## 2020-04-29 ENCOUNTER — Other Ambulatory Visit: Payer: Self-pay

## 2020-04-29 VITALS — BP 138/82 | HR 71 | Temp 98.2°F | Ht 78.0 in | Wt 219.0 lb

## 2020-04-29 DIAGNOSIS — E785 Hyperlipidemia, unspecified: Secondary | ICD-10-CM

## 2020-04-29 DIAGNOSIS — E1169 Type 2 diabetes mellitus with other specified complication: Secondary | ICD-10-CM

## 2020-04-29 DIAGNOSIS — I25118 Atherosclerotic heart disease of native coronary artery with other forms of angina pectoris: Secondary | ICD-10-CM | POA: Diagnosis not present

## 2020-04-29 DIAGNOSIS — N1831 Chronic kidney disease, stage 3a: Secondary | ICD-10-CM

## 2020-04-29 NOTE — Progress Notes (Signed)
Subjective:    Patient ID: Dylan Robles, male    DOB: [birth year], 80 y.o.   MRN: [id number]  HPI: Dylan Robles is a 80 y.o. male presenting for follow up.  Chief Complaint  Patient presents with  . Follow-up    Still feeling cold all the time Eye surgery to remove cataract 5/2. [facility] in [place]   Care team: PCP- [name] Ophthalmologist- Mallie Snooks Podiatrist- Helane Gunther Vascular surgeon-Gregory Schnier Cardiology- [facility]  DIABETES Eats okra and string beans, fish, beef ribs. He misses his nighttime insulin 2-3 times per week for an unknown reason.  Just forgets to take it.  Hypoglycemic episodes:no Polydipsia/polyuria: no Visual disturbance: no Chest pain: no Paresthesias: yes Glucose Monitoring: yes  Accucheck frequency: about once per week  Fasting glucose: 120s reportedly Taking Insulin?: yes  Long acting insulin: 28-34 units once daily at night time  Short acting insulin: 6 unit with meals Blood Pressure Monitoring: not checking Retinal Examination: Up to Date; reports he is having cataracts removed on May 2 Foot Exam: Up to Date Diabetic Education: Completed Pneumovax: Up to Date Influenza: Up to Date Aspirin: yes  HYPERTENSION / HYPERLIPIDEMIA Currently taking metoprolol XL 50 mg, lisinopril 10 mg, Imdur 30 mg, amlodipine 5 mg for blood pressure.   Taking furosemide 20 mg daily prn, Zetia 10 mg daily, Brilinta 60 mg twice daily, atorvastatin 80 mg daily, aspirin 81 mg daily for CAD/HLD. Satisfied with current treatment? yes Duration of hypertension: chronic BP monitoring frequency: not checking BP medication side effects: no Duration of hyperlipidemia: chronic Cholesterol medication side effects: no Aspirin: yes Recent stressors: no Recurrent headaches: no Visual changes: no Palpitations: no Dyspnea: no Chest pain: no Lower extremity edema: no Dizzy/lightheaded: no   CHRONIC KIDNEY DISEASE CKD status:  controlled Medications renally dose: yes Previous renal evaluation: no Pneumovax:  Up to Date Influenza Vaccine:  Up to Date  Allergies  Allergen Reactions  . Plavix [Clopidogrel] Hives    Outpatient Encounter Medications as of 04/29/2020  Medication Sig  . amLODipine (NORVASC) 5 MG tablet Take 1 tablet (5 mg total) by mouth daily.  [name] aspirin EC 81 MG tablet Take 81 mg by mouth daily.   [name] atorvastatin (LIPITOR) 80 MG tablet Take 1 tablet (80 mg total) by mouth at bedtime.  . Blood Glucose Calibration (TRUE METRIX LEVEL 1) Low SOLN 1 each by In Vitro route as directed.  . Blood Glucose Monitoring Suppl (TRUE METRIX AIR GLUCOSE METER) w/Device KIT 1 each by Does not apply route as directed.  [name] BRILINTA 60 MG TABS tablet Take 60 mg by mouth 2 (two) times daily.   . diclofenac Sodium (VOLTAREN) 1 % GEL Apply 2 g topically 2 (two) times daily as needed.  . DROPLET INSULIN SYRINGE 30G X 5/16" 1 ML MISC USE AS DIRECTED  . ezetimibe (ZETIA) 10 MG tablet Take 10 mg by mouth daily.  . furosemide (LASIX) 20 MG tablet Take 20 mg by mouth as needed.  . gabapentin (NEURONTIN) 300 MG capsule Take 1 capsule (300 mg total) by mouth 3 (three) times daily.  [name] glipiZIDE (GLUCOTROL) 5 MG tablet Take 0.5 tablets (2.5 mg total) by mouth daily before breakfast.  . glucose blood test strip 1 each by Other route as needed. Use as instructed  . insulin aspart (NOVOLOG) 100 UNIT/ML injection Inject 5 Units into the skin 3 (three) times daily before meals.   . isosorbide mononitrate (IMDUR) 30 MG 24 hr  tablet Take 1 tablet (30 mg total) by mouth daily.  [name] ketoconazole (NIZORAL) 2 % cream Apply 1 application topically daily.  [name] LEVEMIR FLEXTOUCH 100 UNIT/ML Pen Inject 34 Units into the skin at bedtime.   [name] levothyroxine (SYNTHROID) 88 MCG tablet Take 1 tablet (88 mcg total) by mouth daily before breakfast.  . lisinopril (ZESTRIL) 10 MG tablet Take 1 tablet (10 mg total) by mouth daily.  . metFORMIN (GLUCOPHAGE)  1000 MG tablet Take 1 tablet (1,000 mg total) by mouth 2 (two) times daily with a meal.  . metoprolol succinate (TOPROL-XL) 50 MG 24 hr tablet Take 1 tablet (50 mg total) by mouth daily.  . tamsulosin (FLOMAX) 0.4 MG CAPS capsule Take 1 capsule (0.4 mg total) by mouth at bedtime.   No facility-administered encounter medications on file as of 04/29/2020.    Patient Active Problem List   Diagnosis Date Noted  . Stage 3a chronic kidney disease ([place]) 03/03/2020  . Left arm pain 01/29/2020  . Atherosclerosis of native arteries of extremity with intermittent claudication ([place]) 12/19/2018  . Hyperlipidemia 12/19/2018  . Diabetic neuropathy ([place]) 09/26/2018  . Type 2 diabetes mellitus with vascular disease ([place]) 09/26/2018  . Coronary artery disease of native artery of native heart with stable angina pectoris ([place]) 09/20/2017  . Type 2 diabetes mellitus with hyperlipidemia ([place]) 09/16/2008  . Hypothyroidism 03/29/2006  . Essential hypertension 01/13/2006  . PERIPHERAL VASCULAR DISEASE 01/13/2006    Past Medical History:  Diagnosis Date  . Acute lower UTI 09/20/2017  . Bradycardia on ECG 07/31/2011  . Coronary artery disease    hyperlipidemia  . Decreased GFR 09/23/2019  . Dehydration 09/21/2017  . Diabetes mellitus   . Diabetes mellitus without complication ([place])   . Hypercholesterolemia   . Hypertension   . Hypothyroidism   . Paronychia of great toe of left foot 09/26/2018  . Paronychia of great toe, left 09/26/2018  . PERIPHERAL NEUROPATHY 01/13/2006   Qualifier: Diagnosis of  By: Truett Mainland MD, Christine    . Shortness of breath dyspnea    with exertion  . Vasovagal syncope   . Weakness 09/20/2017    Relevant past medical, surgical, family and social history reviewed and updated as indicated. Interim medical history since our last visit reviewed.  Review of Systems Per HPI unless specifically indicated above     Objective:    BP 138/82   Pulse 71   Temp 98.2 F (36.8 C)   Ht $R'6\' 6"'Pa$   (1.981 m)   Wt 219 lb (99.3 kg)   SpO2 98%   BMI 25.31 kg/m   Wt Readings from Last 3 Encounters:  04/29/20 219 lb (99.3 kg)  03/31/20 219 lb 9.6 oz (99.6 kg)  03/03/20 234 lb (106.1 kg)    Physical Exam Vitals and nursing note reviewed.  Constitutional:      General: He is not in acute distress.    Appearance: Normal appearance. He is normal weight. He is not toxic-appearing.  HENT:     Head: Normocephalic and atraumatic.  Eyes:     General: No scleral icterus.    Extraocular Movements: Extraocular movements intact.     Comments: Bilateral cataracts noted  Cardiovascular:     Rate and Rhythm: Normal rate and regular rhythm.     Heart sounds: Normal heart sounds. No murmur heard.   Pulmonary:     Effort: Pulmonary effort is normal. No respiratory distress.     Breath sounds: Normal breath sounds. No wheezing, rhonchi or  rales.  Musculoskeletal:     Cervical back: Normal range of motion.     Right lower leg: No edema.     Left lower leg: No edema.  Lymphadenopathy:     Cervical: No cervical adenopathy.  Skin:    General: Skin is warm and dry.     Coloration: Skin is not jaundiced or pale.     Findings: No erythema.  Neurological:     General: No focal deficit present.     Mental Status: He is alert and oriented to person, place, and time.     Motor: No weakness.     Gait: Gait (Walks with cane) normal.  Psychiatric:        Mood and Affect: Mood normal.        Behavior: Behavior normal.        Thought Content: Thought content normal.        Judgment: Judgment normal.       Assessment & Plan:   Problem List Items Addressed This Visit      Cardiovascular and Mediastinum   Coronary artery disease of native artery of native heart with stable angina pectoris (HCC)    Chronic.  Follows with vascular surgeon as well as cardiologist.  We will request records from cardiologist.  We will check lipids today-patient is not fasting.  Continue current medications for now and  collaboration with specialists.      Relevant Orders   AMB Referral to [facility] Coordin[facility]     Endocrine   Type 2 diabetes mellitus with hyperlipidemia ([place]) - Primary    Chronic.  Patient reports blood sugars in the low 120s, however he is not checking them very often.  He also reports he is missing his nighttime insulin doses a few times per week which is concerning.  He denies affordability issues with the insulin, just does not feel like taking it all the time. Last A1c January 2022 was above goal of less than 8%.  We will recheck A1c today and CMP to assess kidney function.  If A1c remains elevated, consider referral to endocrinology for ongoing management.  He is currently on metformin, glipizide, mealtime insulin, as well as long-acting insulin.  We will consider discontinuing metformin if kidney function is continuing to decline.  Follow-up in 3 months or sooner pending blood work.      Relevant Orders   Hemoglobin A1c (Completed)   COMPLETE METABOLIC PANEL WITH GFR (Completed)   Lipid Panel (Completed)   AMB Referral to [facility]     Genitourinary   Stage 3a chronic kidney disease (HCC)    Chronic.  Will recheck kidney function today.  Again discussed with patient importance of maximizing control of diabetes and high blood pressure to prevent kidney further kidney damage.  Consider nephrology consultation pending kidney function results.  Follow up 3 months.      Relevant Orders   AMB Referral to [facility] Coordinaton       Follow up plan: Return in about 3 months (around 07/29/2020) for dm, hld, htn, ckd.

## 2020-04-29 NOTE — Patient Instructions (Signed)
F/u in 3 months for chronic disease  https://www.diabeteseducator.org/docs/default-source/living-with-diabetes/conquering-the-grocery-store-v1.pdf?sfvrsn=4">  Carbohydrate Counting for Diabetes Mellitus, Adult Carbohydrate counting is a method of keeping track of how many carbohydrates you eat. Eating carbohydrates naturally increases the amount of sugar (glucose) in the blood. Counting how many carbohydrates you eat improves your blood glucose control, which helps you manage your diabetes. It is important to know how many carbohydrates you can safely have in each meal. This is different for every person. A dietitian can help you make a meal plan and calculate how many carbohydrates you should have at each meal and snack. What foods contain carbohydrates? Carbohydrates are found in the following foods:  Grains, such as breads and cereals.  Dried beans and soy products.  Starchy vegetables, such as potatoes, peas, and corn.  Fruit and fruit juices.  Milk and yogurt.  Sweets and snack foods, such as cake, cookies, candy, chips, and soft drinks.   How do I count carbohydrates in foods? There are two ways to count carbohydrates in food. You can read food labels or learn standard serving sizes of foods. You can use either of the methods or a combination of both. Using the Nutrition Facts label The Nutrition Facts list is included on the labels of almost all packaged foods and beverages in the [place] It includes:  The serving size.  Information about nutrients in each serving, including the grams (g) of carbohydrate per serving. To use the Nutrition Facts:  Decide how many servings you will have.  Multiply the number of servings by the number of carbohydrates per serving.  The resulting number is the total amount of carbohydrates that you will be having. Learning the standard serving sizes of foods When you eat carbohydrate foods that are not packaged or do not include Nutrition Facts on  the label, you need to measure the servings in order to count the amount of carbohydrates.  Measure the foods that you will eat with a food scale or measuring cup, if needed.  Decide how many standard-size servings you will eat.  Multiply the number of servings by 15. For foods that contain carbohydrates, one serving equals 15 g of carbohydrates. ? For example, if you eat 2 cups or 10 oz (300 g) of strawberries, you will have eaten 2 servings and 30 g of carbohydrates (2 servings x 15 g = 30 g).  For foods that have more than one food mixed, such as soups and casseroles, you must count the carbohydrates in each food that is included. The following list contains standard serving sizes of common carbohydrate-rich foods. Each of these servings has about 15 g of carbohydrates:  1 slice of bread.  1 six-inch (15 cm) tortilla.  ? cup or 2 oz (53 g) cooked rice or pasta.   cup or 3 oz (85 g) cooked or canned, drained and rinsed beans or lentils.   cup or 3 oz (85 g) starchy vegetable, such as peas, corn, or squash.   cup or 4 oz (120 g) hot cereal.   cup or 3 oz (85 g) boiled or mashed potatoes, or  or 3 oz (85 g) of a large baked potato.   cup or 4 fl oz (118 mL) fruit juice.  1 cup or 8 fl oz (237 mL) milk.  1 small or 4 oz (106 g) apple.   or 2 oz (63 g) of a medium banana.  1 cup or 5 oz (150 g) strawberries.  3 cups or 1 oz (24  g) popped popcorn. What is an example of carbohydrate counting? To calculate the number of carbohydrates in this sample meal, follow the steps shown below. Sample meal  3 oz (85 g) chicken breast.  ? cup or 4 oz (106 g) brown rice.   cup or 3 oz (85 g) corn.  1 cup or 8 fl oz (237 mL) milk.  1 cup or 5 oz (150 g) strawberries with sugar-free whipped topping. Carbohydrate calculation 1. Identify the foods that contain carbohydrates: ? Rice. ? Corn. ? Milk. ? Strawberries. 2. Calculate how many servings you have of each food: ? 2  servings rice. ? 1 serving corn. ? 1 serving milk. ? 1 serving strawberries. 3. Multiply each number of servings by 15 g: ? 2 servings rice x 15 g = 30 g. ? 1 serving corn x 15 g = 15 g. ? 1 serving milk x 15 g = 15 g. ? 1 serving strawberries x 15 g = 15 g. 4. Add together all of the amounts to find the total grams of carbohydrates eaten: ? 30 g + 15 g + 15 g + 15 g = 75 g of carbohydrates total. What are tips for following this plan? Shopping  Develop a meal plan and then make a shopping list.  Buy fresh and frozen vegetables, fresh and frozen fruit, dairy, eggs, beans, lentils, and whole grains.  Look at food labels. Choose foods that have more fiber and less sugar.  Avoid processed foods and foods with added sugars. Meal planning  Aim to have the same amount of carbohydrates at each meal and for each snack time.  Plan to have regular, balanced meals and snacks. Where to find more information  American Diabetes Association: www.diabetes.org  Centers for Disease Control and Prevention: http://www.wolf.info/ Summary  Carbohydrate counting is a method of keeping track of how many carbohydrates you eat.  Eating carbohydrates naturally increases the amount of sugar (glucose) in the blood.  Counting how many carbohydrates you eat improves your blood glucose control, which helps you manage your diabetes.  A dietitian can help you make a meal plan and calculate how many carbohydrates you should have at each meal and snack. This information is not intended to replace advice given to you by your health care provider. Make sure you discuss any questions you have with your health care provider. Document Revised: 12/26/2018 Document Reviewed: 12/27/2018 Elsevier Patient Education  2021 [employer].

## 2020-04-30 ENCOUNTER — Telehealth: Payer: Self-pay | Admitting: Nurse Practitioner

## 2020-04-30 ENCOUNTER — Encounter: Payer: Self-pay | Admitting: Nurse Practitioner

## 2020-04-30 LAB — HEMOGLOBIN A1C
Hgb A1c MFr Bld: 10.5 % of total Hgb — ABNORMAL HIGH (ref <5.7)
Mean Plasma Glucose: 255 mg/dL
eAG (mmol/L): 14.1 mmol/L

## 2020-04-30 LAB — LIPID PANEL
Cholesterol: 175 mg/dL (ref <200)
HDL: 47 mg/dL (ref >=40)
LDL Cholesterol (Calc): 110 mg/dL (calc) — ABNORMAL HIGH
Non-HDL Cholesterol (Calc): 128 mg/dL (calc) (ref <130)
Total CHOL/HDL Ratio: 3.7 (calc) (ref <5.0)
Triglycerides: 90 mg/dL (ref <150)

## 2020-04-30 LAB — COMPLETE METABOLIC PANEL WITH GFR
AG Ratio: 1.6 (calc) (ref 1.0–2.5)
ALT: 9 U/L (ref 9–46)
AST: 12 U/L (ref 10–35)
Albumin: 3.9 g/dL (ref 3.6–5.1)
Alkaline phosphatase (APISO): 106 U/L (ref 35–144)
BUN/Creatinine Ratio: 13 (calc) (ref 6–22)
BUN: 16 mg/dL (ref 7–25)
CO2: 23 mmol/L (ref 20–32)
Calcium: 10 mg/dL (ref 8.6–10.3)
Chloride: 103 mmol/L (ref 98–110)
Creat: 1.23 mg/dL — ABNORMAL HIGH (ref 0.70–1.11)
GFR, Est African American: 63 mL/min/{1.73_m2} (ref >=60)
GFR, Est Non African American: 54 mL/min/{1.73_m2} — ABNORMAL LOW (ref >=60)
Globulin: 2.4 g/dL (calc) (ref 1.9–3.7)
Glucose, Bld: 277 mg/dL — ABNORMAL HIGH (ref 65–99)
Potassium: 4.9 mmol/L (ref 3.5–5.3)
Sodium: 137 mmol/L (ref 135–146)
Total Bilirubin: 0.5 mg/dL (ref 0.2–1.2)
Total Protein: 6.3 g/dL (ref 6.1–8.1)

## 2020-04-30 NOTE — Assessment & Plan Note (Signed)
Chronic.  Follows with vascular surgeon as well as cardiologist.  We will request records from cardiologist.  We will check lipids today-patient is not fasting.  Continue current medications for now and collaboration with specialists.

## 2020-04-30 NOTE — Progress Notes (Signed)
  Chronic Care Management   Note  04/30/2020 Name: Dylan Robles MRN: [id number] DOB: May 28, 1940  Dylan Robles is a 80 y.o. year old male who is a primary care patient of [name], NP. I reached out to [name] by phone today in response to a referral sent by Dylan Robles PCP, [name], NP.   Dylan Robles was given information about Chronic Care Management services today including:  1. CCM service includes personalized support from designated clinical staff supervised by his physician, including individualized plan of care and coordination with other care providers 2. 24/7 contact phone numbers for assistance for urgent and routine care needs. 3. Service will only be billed when office clinical staff spend 20 minutes or more in a month to coordinate care. 4. Only one practitioner may furnish and bill the service in a calendar month. 5. The patient may stop CCM services at any time (effective at the end of the month) by phone call to the office staff.   Patient agreed to services and verbal consent obtained.   Follow up plan:   Carley Perdue [employer] Scheduler

## 2020-04-30 NOTE — Assessment & Plan Note (Addendum)
Chronic.  Patient reports blood sugars in the low 120s, however he is not checking them very often.  He also reports he is missing his nighttime insulin doses a few times per week which is concerning.  He denies affordability issues with the insulin, just does not feel like taking it all the time. Last A1c January 2022 was above goal of less than 8%.  We will recheck A1c today and CMP to assess kidney function.  If A1c remains elevated, consider referral to endocrinology for ongoing management.  He is currently on metformin, glipizide, mealtime insulin, as well as long-acting insulin.  We will consider discontinuing metformin if kidney function is continuing to decline.  Follow-up in 3 months or sooner pending blood work.

## 2020-04-30 NOTE — Assessment & Plan Note (Signed)
Chronic.  Will recheck kidney function today.  Again discussed with patient importance of maximizing control of diabetes and high blood pressure to prevent kidney further kidney damage.  Consider nephrology consultation pending kidney function results.  Follow up 3 months.

## 2020-05-03 ENCOUNTER — Ambulatory Visit: Payer: Medicare HMO | Admitting: Nurse Practitioner

## 2020-05-06 ENCOUNTER — Ambulatory Visit (INDEPENDENT_AMBULATORY_CARE_PROVIDER_SITE_OTHER): Payer: Medicare HMO | Admitting: Podiatry

## 2020-05-06 ENCOUNTER — Other Ambulatory Visit: Payer: Self-pay

## 2020-05-06 ENCOUNTER — Encounter: Payer: Self-pay | Admitting: Podiatry

## 2020-05-06 DIAGNOSIS — E1142 Type 2 diabetes mellitus with diabetic polyneuropathy: Secondary | ICD-10-CM

## 2020-05-06 DIAGNOSIS — I739 Peripheral vascular disease, unspecified: Secondary | ICD-10-CM | POA: Diagnosis not present

## 2020-05-06 DIAGNOSIS — L608 Other nail disorders: Secondary | ICD-10-CM | POA: Diagnosis not present

## 2020-05-06 DIAGNOSIS — M79674 Pain in right toe(s): Secondary | ICD-10-CM

## 2020-05-06 DIAGNOSIS — M79675 Pain in left toe(s): Secondary | ICD-10-CM

## 2020-05-06 DIAGNOSIS — B351 Tinea unguium: Secondary | ICD-10-CM | POA: Diagnosis not present

## 2020-05-06 NOTE — Progress Notes (Signed)
This patient returns to my office for at risk foot care.  This patient requires this care by a professional since this patient will be at risk due to having diabetes and PVD.  This patient is unable to cut nails himself since the patient cannot reach his nails.These nails are painful walking and wearing shoes.  This patient presents for at risk foot care today.  General Appearance  Alert, conversant and in no acute stress.  Vascular  Dorsalis pedis and posterior tibial  pulses are weakly palpable  bilaterally.  Capillary return is within normal limits  bilaterally. Cold feet  Bilaterally. Absent digital hair.  Swelling foot 1/4  Neurologic  Senn-Weinstein monofilament wire test within normal limits right..LOPS absent left foot. Muscle power within normal limits bilaterally.  Nails Thick disfigured discolored nails with subungual debris  from hallux to fifth toes bilaterally. No evidence of bacterial infection or drainage bilaterally.  Orthopedic  No limitations of motion  feet .  No crepitus or effusions noted.  No bony pathology or digital deformities noted.  Skin  normotropic skin with no porokeratosis noted bilaterally.  No signs of infections or ulcers noted.     Onychomycosis  Pain in right toes  Pain in left toes  Consent was obtained for treatment procedures.   Mechanical debridement of nails 1-5  bilaterally performed with a nail nipper.  Filed with dremel without incident.    Return office visit   3 months                   Told patient to return for periodic foot care and evaluation due to potential at risk complications.   Lucciana Head DPM  

## 2020-06-01 ENCOUNTER — Telehealth: Payer: Self-pay | Admitting: Pharmacist

## 2020-06-01 NOTE — Progress Notes (Signed)
Chronic Care Management Pharmacy Note  06/04/2020 Name:  EDILSON VITAL MRN:  [id number] DOB:  1940/10/18  Subjective: DACE DENN is an 80 y.o. year old male who is a primary patient of [name], NP.  The CCM team was consulted for assistance with disease management and care coordination needs.    Engaged with patient face to face for initial visit in response to provider referral for pharmacy case management and/or care coordination services.   Consent to Services:  The patient was given the following information about Chronic Care Management services today, agreed to services, and gave verbal consent: 1. CCM service includes personalized support from designated clinical staff supervised by the primary care provider, including individualized plan of care and coordination with other care providers 2. 24/7 contact phone numbers for assistance for urgent and routine care needs. 3. Service will only be billed when office clinical staff spend 20 minutes or more in a month to coordinate care. 4. Only one practitioner may furnish and bill the service in a calendar month. 5.The patient may stop CCM services at any time (effective at the end of the month) by phone call to the office staff. 6. The patient will be responsible for cost sharing (co-pay) of up to 20% of the service fee (after annual deductible is met). Patient agreed to services and consent obtained.  Patient Care Team: [name], NP as PCP - General (Nurse Practitioner) Alycia Rossetti, MD (Family Medicine) Edythe Clarity, [facility] as Pharmacist (Pharmacist)  Recent office visits:  04/29/20 [name], NP. For follow-up. No medication changes.  03/31/20 [name], NP. For cold intolerance/Shortness of breath. No medication changes. Per note X-rays preformed.  03/03/20 [name], NP. For follow-up. No medication changes.  01/29/20 [name], NP. For follow-up. STARTED Diclofenac  Sodium 2 g 2 times daily PRN. CHANGED Gabapentin to 300 mg 3 times daily and Metformin 1,000 mg 2 times daily.  Recent consult visits:  05/06/20 Podiatry [name], DPM. For pain due to onychomycosis of toenails of both feet. No medication changes.  04/27/20 Ophthalmology Margreta Journey, MD. For cataract evaluation.  03/01/20 Vasc Surg Schnier, Dolores Lory, MD. For follow-up. No medication changes.  02/04/20 Podiatry McDonald, Stephan Minister, DPM. For follow-up. STARTED Ketoconazole 2% daily.   Hospital visits:  None in previous 6 months   Medication History:  Atorvastatin 80 mg 90 DS 07/17/19 Gabapentin 300 mg 90 DS 09/08/19 Lisinopril 10 mg 90 DS 08/13/19 Metformin 1000 mg 90 DS 06/04/19  Objective:  Lab Results  Component Value Date   CREATININE 1.23 (H) 04/29/2020   BUN 16 04/29/2020   GFRNONAA 54 (L) 04/29/2020   GFRAA 63 04/29/2020   NA 137 04/29/2020   K 4.9 04/29/2020   CALCIUM 10.0 04/29/2020   CO2 23 04/29/2020   GLUCOSE 277 (H) 04/29/2020    Lab Results  Component Value Date/Time   HGBA1C 10.5 (H) 04/29/2020 11:41 AM   HGBA1C 8.8 (H) 01/29/2020 11:06 AM   MICROALBUR 1.4 09/22/2019 04:13 PM   MICROALBUR 0.67 12/17/2006 09:03 PM    Last diabetic Eye exam: No results found for: HMDIABEYEEXA  Last diabetic Foot exam: No results found for: HMDIABFOOTEX   Lab Results  Component Value Date   CHOL 175 04/29/2020   HDL 47 04/29/2020   LDLCALC 110 (H) 04/29/2020   TRIG 90 04/29/2020   CHOLHDL 3.7 04/29/2020    Hepatic Function Latest Ref Rng & Units 04/29/2020  09/22/2019 03/19/2019  Total Protein 6.1 - 8.1 g/dL 6.3 6.2 6.2  Albumin 3.5 - 5.0 g/dL - - -  AST 10 - 35 U/L $Remo'12 15 15  'UbVim$ ALT 9 - 46 U/L $Remo'9 11 12  'rkATq$ Alk Phosphatase 38 - 126 U/L - - -  Total Bilirubin 0.2 - 1.2 mg/dL 0.5 0.8 0.8  Bilirubin, Direct mg/dL - - -    Lab Results  Component Value Date/Time   TSH 1.75 03/31/2020 11:16 AM   TSH 1.17 09/22/2019 04:13 PM   FREET4 1.3 09/22/2019 04:13 PM   FREET4 1.3  03/19/2019 03:15 PM    CBC Latest Ref Rng & Units 03/31/2020 01/29/2020 09/22/2019  WBC 3.8 - 10.8 Thousand/uL 6.5 5.9 7.2  Hemoglobin 13.2 - 17.1 g/dL 12.9(L) 12.5(L) 11.7(L)  Hematocrit 38.5 - 50.0 % 40.5 38.1(L) 36.4(L)  Platelets 140 - 400 Thousand/uL 252 241 251    No results found for: VD25OH  Clinical ASCVD: No  The ASCVD Risk score Mikey Bussing DC Jr., et al., 2013) failed to calculate for the following reasons:   The 2013 ASCVD risk score is only valid for ages 63 to 75    Depression screen PHQ 2/9 01/29/2020 03/19/2019  Decreased Interest 0 0  Down, Depressed, Hopeless 0 0  PHQ - 2 Score 0 0  Altered sleeping - 0  Tired, decreased energy - 0  Change in appetite - 0  Feeling bad or failure about yourself  - 0  Trouble concentrating - 0  Moving slowly or fidgety/restless - 0  Suicidal thoughts - 0  PHQ-9 Score - 0  Difficult doing work/chores - Not difficult at all      Social History   Tobacco Use  Smoking Status Former Smoker  Smokeless Tobacco Never Used   BP Readings from Last 3 Encounters:  04/29/20 138/82  03/31/20 138/72  03/03/20 120/80   Pulse Readings from Last 3 Encounters:  04/29/20 71  03/31/20 68  03/03/20 75   Wt Readings from Last 3 Encounters:  04/29/20 219 lb (99.3 kg)  03/31/20 219 lb 9.6 oz (99.6 kg)  03/03/20 234 lb (106.1 kg)   BMI Readings from Last 3 Encounters:  04/29/20 25.31 kg/m  03/31/20 25.38 kg/m  03/03/20 27.04 kg/m    Assessment/Interventions: Review of patient past medical history, allergies, medications, health status, including review of consultants reports, laboratory and other test data, was performed as part of comprehensive evaluation and provision of chronic care management services.   SDOH:  (Social Determinants of Health) assessments and interventions performed: Yes   Financial Resource Strain: Low Risk   . Difficulty of Paying Living Expenses: Not very hard    SDOH Screenings   Alcohol Screen: Not on file   Depression (PHQ2-9): Low Risk   . PHQ-2 Score: 0  Financial Resource Strain: Low Risk   . Difficulty of Paying Living Expenses: Not very hard  Food Insecurity: Not on file  Housing: Not on file  Physical Activity: Not on file  Social Connections: Not on file  Stress: Not on file  Tobacco Use: Medium Risk  . Smoking Tobacco Use: Former Smoker  . Smokeless Tobacco Use: Never Used  Transportation Needs: Not on file    CCM Care Plan  Allergies  Allergen Reactions  . Plavix [Clopidogrel] Hives    Medications Reviewed Today    Reviewed by [name], DPM (Physician) on 05/06/20 at [place] List Status: <None>  Medication Order Taking? Sig Documenting Provider Last Dose Status Informant  amLODipine (NORVASC) 5 MG tablet [id number] Yes Take 1 tablet (5 mg total) by mouth daily. [name], NP Taking Active   aspirin EC 81 MG tablet [id number] Yes Take 81 mg by mouth daily.  [provider] Taking Active Spouse/Significant Other  atorvastatin (LIPITOR) 80 MG tablet [id number] Yes Take 1 tablet (80 mg total) by mouth at bedtime. [name], NP Taking Active   Blood Glucose Calibration (TRUE METRIX LEVEL 1) Low SOLN [id number] Yes 1 each by In Vitro route as directed. Annie Main, FNP Taking Active   Blood Glucose Monitoring Suppl (TRUE METRIX AIR GLUCOSE METER) w/Device KIT [id number] Yes 1 each by Does not apply route as directed. Ishmael Holter A, FNP Taking Active   BRILINTA 60 MG TABS tablet [id number] Yes Take 60 mg by mouth 2 (two) times daily.  [provider] Taking Active Spouse/Significant Other  diclofenac Sodium (VOLTAREN) 1 % GEL [id number] Yes Apply 2 g topically 2 (two) times daily as needed. [name], NP Taking Active   DROPLET INSULIN SYRINGE 30G X 5/16" 1 ML MISC [id number] Yes USE AS DIRECTED Annie Main, FNP Taking Active   ezetimibe (ZETIA) 10 MG tablet [id number] Yes Take 10 mg by mouth daily. [provider]  Taking Active   furosemide (LASIX) 20 MG tablet [id number] Yes Take 20 mg by mouth as needed. [provider] Taking Active   gabapentin (NEURONTIN) 300 MG capsule [id number] Yes Take 1 capsule (300 mg total) by mouth 3 (three) times daily. [name], NP Taking Active   glipiZIDE (GLUCOTROL) 5 MG tablet [id number] Yes Take 0.5 tablets (2.5 mg total) by mouth daily before breakfast. [name], NP Taking Active   glucose blood test strip [id number] Yes 1 each by Other route as needed. Use as instructed Annie Main, FNP Taking Active   insulin aspart (NOVOLOG) 100 UNIT/ML injection [id number] Yes Inject 5 Units into the skin 3 (three) times daily before meals.  [provider] Taking Active Spouse/Significant Other  isosorbide mononitrate (IMDUR) 30 MG 24 hr tablet [id number] Yes Take 1 tablet (30 mg total) by mouth daily. [name], NP Taking Active   ketoconazole (NIZORAL) 2 % cream [id number] Yes Apply 1 application topically daily. Criselda Peaches, DPM Taking Active   LEVEMIR FLEXTOUCH 100 UNIT/ML Pen [id number] Yes Inject 34 Units into the skin at bedtime.  [provider] Taking Active Spouse/Significant Other  levothyroxine (SYNTHROID) 88 MCG tablet [id number] Yes Take 1 tablet (88 mcg total) by mouth daily before breakfast. [name], NP Taking Active   lisinopril (ZESTRIL) 10 MG tablet [id number] Yes Take 1 tablet (10 mg total) by mouth daily. [name], NP Taking Active   metFORMIN (GLUCOPHAGE) 1000 MG tablet [id number] Yes Take 1 tablet (1,000 mg total) by mouth 2 (two) times daily with a meal. [name], NP Taking Active   metoprolol succinate (TOPROL-XL) 50 MG 24 hr tablet [id number] Yes Take 1 tablet (50 mg total) by mouth daily. Annie Main, FNP Taking Active   tamsulosin (FLOMAX) 0.4 MG CAPS capsule [id number] Yes Take 1 capsule (0.4 mg total) by mouth at bedtime. [name], NP Taking Active            Patient Active Problem List   Diagnosis Date Noted  . Stage 3a chronic kidney disease ([place]) 03/03/2020  . Left arm pain 01/29/2020  . Atherosclerosis of native arteries of extremity with intermittent claudication ([place]) 12/19/2018  .  Hyperlipidemia 12/19/2018  . Diabetic neuropathy ([place]) 09/26/2018  . Type 2 diabetes mellitus with vascular disease ([place]) 09/26/2018  . Coronary artery disease of native artery of native heart with stable angina pectoris ([place]) 09/20/2017  . Type 2 diabetes mellitus with hyperlipidemia ([place]) 09/16/2008  . Hypothyroidism 03/29/2006  . Essential hypertension 01/13/2006  . PERIPHERAL VASCULAR DISEASE 01/13/2006    Immunization History  Administered Date(s) Administered  . Fluad Quad(high Dose 65+) 09/22/2019  . Influenza Whole 10/05/2005, 12/17/2006  . Influenza,inj,quad, With Preservative 11/25/2018  . Moderna Sars-Covid-2 Vaccination 03/06/2019, 04/03/2019, 01/06/2020  . Pneumococcal Conjugate-13 01/29/2020  . Pneumococcal Polysaccharide-23 08/21/2005  . Td 12/17/2006    Conditions to be addressed/monitored:  HTN, CAD, DM w/ neuropathy, Hypothyroidism, HLD  Care Plan : General Pharmacy (Adult)  Updates made by Edythe Clarity, RPH since 06/04/2020 12:00 AM    Problem: HTN, CAD, DM w/ neuropathy, Hypothyroidism, HLD   Priority: High  Onset Date: 06/03/2020    Long-Range Goal: Patient-Specific Goal   Start Date: 06/03/2020  Expected End Date: 12/05/2020  This Visit's Progress: On track  Priority: High  Note:   Current Barriers:  . Unable to independently afford treatment regimen . Unable to independently monitor therapeutic efficacy . Unable to achieve control of DM   Pharmacist Clinical Goal(s):  [name] Patient will verbalize ability to afford treatment regimen . achieve adherence to monitoring guidelines and medication adherence to achieve therapeutic efficacy . achieve control of DM as evidenced by home monitoring/A1c . adhere to  plan to optimize therapeutic regimen for DM as evidenced by report of adherence to recommended medication management changes . adhere to prescribed medication regimen as evidenced by fill dates through collaboration with PharmD and provider.   Interventions: . 1:1 collaboration with [name], NP regarding development and update of comprehensive plan of care as evidenced by provider attestation and co-signature . Inter-disciplinary care team collaboration (see longitudinal plan of care) . Comprehensive medication review performed; medication list updated in electronic medical record  Hypertension (BP goal <130/80) -Controlled -Current treatment: . Amlodipine $RemoveBefo'5mg'ZEPcUzQhOKD$  daily . Metoprolol XL $RemoveBefor'50mg'POItrJMTKQYH$  daily . Imdur $Remov'30mg'oqWEcl$  24hr daily . Lisinopril $RemoveBefo'10mg'rneLvvIdsJK$  daily -Medications previously tried: none noted -Current home readings: not logs available, dose check it and reports normal -Current dietary habits: see DM -Current exercise habits: minimal, outside walking around house alot -Denies hypotensive/hypertensive symptoms -Educated on BP goals and benefits of medications for prevention of heart attack, stroke and kidney damage; Daily salt intake goal < 2300 mg; Exercise goal of 150 minutes per week; Importance of home blood pressure monitoring; -Counseled to monitor BP at home a few times weekly, document, and provide log at future appointments -Recommended to continue current medication  Hyperlipidemia: (LDL goal < 70) -Not ideally controlled -Current treatment: . Atorvastatin $RemoveBefore'80mg'qwnyAQGEUAMMV$  daily . Zetia $Remov'10mg'MslUQe$  daily -Medications previously tried: simvastatin  -Current dietary patterns: see DM -Current exercise habits: see above -Educated on Cholesterol goals;  Benefits of statin for ASCVD risk reduction; Importance of limiting foods high in cholesterol; Exercise goal of 150 minutes per week;  -LDL most recently slightly elevated, however patient was not fasting -Recommended to continue current  medication Recommended repeat lipid panel for accuracy  Diabetes (A1c goal <8%) -Uncontrolled -Current medications: [name] Metformin $RemoveBeforeDEI'1000mg'IaOHTeJhKqquKBRL$  BID . Levemir 34 units hs . Novolog 5 units TID with meals . Glipizide 2.$RemoveBefor'5mg'DgAWoOeFHzBU$  daily with breakfast -Medications previously tried: [place]  -Current home glucose readings . fasting glucose: not checking . post prandial glucose: not checking -Denies hypoglycemic/hyperglycemic symptoms -Current  meal patterns:  . breakfast:   . lunch: sandwich, using white wheat bread  . dinner: home cooked meal with meat, starch, veggies . snacks:  . drinks: drinks at least two regular sodas per day -Current exercise: minimal -Educated on A1c and blood sugar goals; Complications of diabetes including kidney damage, retinal damage, and cardiovascular disease; Exercise goal of 150 minutes per week; Prevention and management of hypoglycemic episodes; Benefits of routine self-monitoring of blood sugar; Risk of not checking sugar on insulin -Counseled to check feet daily and get yearly eye exams -Counseled on diet and exercise extensively Recommended to continue current medication Recommended he start to check at least his fasting sugar for me and write it down in a log so that we can keep track.  We also discussed trying to cut back on regular soda intake and the amount of carbs he is eating with each meal.  Try to limit meals to one carbohydrate source per meal. Assessed patient finances. does report Levemir and Novolog are expensive.  He also sometimes does not take his nighttime Levemir for an unknown reason.  Stressed importance of this dose of Levemir and patient agrees to be better about taking.  Will have PAP form mailed to him for both insulins.    Hypothyroidism (Goal: Maintain TSH) -Controlled -Current treatment  . Levothyroxine 44mcg daily -Medications previously tried: none noted  -TSH normal, takes medication correctly -Recommended to continue current  medication   Patient Goals/Self-Care Activities . Patient will:  - take medications as prescribed focus on medication adherence by using a pill box, setting reminders check glucose at least fasting, document, and provide at future appointments check blood pressure a few times per week, document, and provide at future appointments engage in dietary modifications by limiting intake of sodas and carbohydrates  Follow Up Plan: The care management team will reach out to the patient again over the next 60 days.         Medication Assistance: Application for Levemir and Novolog  medication assistance program. in process.  Anticipated assistance start date unknown.  See plan of care for additional detail.  Patient's preferred pharmacy is:  [facility] [place], [place] - [address] [place] [address] Phone: [phone] Fax: [phone]  [facility] [address] [place] [address] Phone: [phone] Fax: [phone]  [facility] [address] [place] [address] Phone: [phone]   [facility] - [place], [place] [address] Phone: [phone] Fax: [phone]  Uses pill box? Yes Pt endorses 100% compliance  We discussed: Benefits of medication synchronization, packaging and delivery as well as enhanced pharmacist oversight with [facility]. Patient decided to: Continue current medication management strategy  Care Plan and Follow Up Patient Decision:  Patient agrees to Care Plan and Follow-up.  Plan: The care management team will reach out to the patient again over the next 60 days.  Beverly Milch, PharmD Clinical Pharmacist [place] [phone]

## 2020-06-01 NOTE — Progress Notes (Addendum)
Chronic Care Management Pharmacy Assistant   Name: Dylan Robles  MRN: [id number] DOB: [birth year]  Dylan Robles is an 80 y.o. year old male who presents for his initial CCM visit with the clinical pharmacist.  Reason for Encounter: Chart Prep   Conditions to be addressed/monitored: HTN,CAD,Type 2 DM,CKD,HLD.  Primary concerns for visit include: HTN, Type 2 DM.   Recent office visits:  04/29/20 Dylan Bear, NP. For follow-up. No medication changes.  03/31/20 Dylan Bear, NP. For cold intolerance/Shortness of breath. No medication changes. Per note X-rays preformed.  03/03/20 Dylan Bear, NP. For follow-up. No medication changes.  01/29/20 Dylan Bear, NP. For follow-up. STARTED Diclofenac Sodium 2 g 2 times daily PRN. CHANGED Gabapentin to 300 mg 3 times daily and Metformin 1,000 mg 2 times daily.  Recent consult visits:  05/06/20 Podiatry Dylan Robles, DPM. For pain due to onychomycosis of toenails of both feet. No medication changes.  04/27/20 Ophthalmology Dylan Journey, MD. For cataract evaluation.  03/01/20 Vasc Surg Dylan Robles, Dylan Lory, MD. For follow-up. No medication changes.  02/04/20 Podiatry Dylan Robles, Dylan Robles, DPM. For follow-up. STARTED Ketoconazole 2% daily.   Hospital visits:  None in previous 6 months   Medication History:  Atorvastatin 80 mg 90 DS 07/17/19 Gabapentin 300 mg 90 DS 09/08/19 Lisinopril 10 mg 90 DS 08/13/19 Metformin 1000 mg 90 DS 06/04/19  Medications: Outpatient Encounter Medications as of 06/01/2020  Medication Sig   amLODipine (NORVASC) 5 MG tablet Take 1 tablet (5 mg total) by mouth daily.   aspirin EC 81 MG tablet Take 81 mg by mouth daily.    atorvastatin (LIPITOR) 80 MG tablet Take 1 tablet (80 mg total) by mouth at bedtime.   Blood Glucose Calibration (TRUE METRIX LEVEL 1) Low SOLN 1 each by In Vitro route as directed.   Blood Glucose Monitoring Suppl (TRUE METRIX AIR GLUCOSE METER) w/Device KIT 1 each by Does  not apply route as directed.   BRILINTA 60 MG TABS tablet Take 60 mg by mouth 2 (two) times daily.    diclofenac Sodium (VOLTAREN) 1 % GEL Apply 2 g topically 2 (two) times daily as needed.   DROPLET INSULIN SYRINGE 30G X 5/16" 1 ML MISC USE AS DIRECTED   ezetimibe (ZETIA) 10 MG tablet Take 10 mg by mouth daily.   furosemide (LASIX) 20 MG tablet Take 20 mg by mouth as needed.   gabapentin (NEURONTIN) 300 MG capsule Take 1 capsule (300 mg total) by mouth 3 (three) times daily.   glipiZIDE (GLUCOTROL) 5 MG tablet Take 0.5 tablets (2.5 mg total) by mouth daily before breakfast.   glucose blood test strip 1 each by Other route as needed. Use as instructed   insulin aspart (NOVOLOG) 100 UNIT/ML injection Inject 5 Units into the skin 3 (three) times daily before meals.    isosorbide mononitrate (IMDUR) 30 MG 24 hr tablet Take 1 tablet (30 mg total) by mouth daily.   ketoconazole (NIZORAL) 2 % cream Apply 1 application topically daily.   LEVEMIR FLEXTOUCH 100 UNIT/ML Pen Inject 34 Units into the skin at bedtime.    levothyroxine (SYNTHROID) 88 MCG tablet Take 1 tablet (88 mcg total) by mouth daily before breakfast.   lisinopril (ZESTRIL) 10 MG tablet Take 1 tablet (10 mg total) by mouth daily.   metFORMIN (GLUCOPHAGE) 1000 MG tablet Take 1 tablet (1,000 mg total) by mouth 2 (two) times daily with a meal.   metoprolol succinate (TOPROL-XL) 50 MG  24 hr tablet Take 1 tablet (50 mg total) by mouth daily.   tamsulosin (FLOMAX) 0.4 MG CAPS capsule Take 1 capsule (0.4 mg total) by mouth at bedtime.   No facility-administered encounter medications on file as of 06/01/2020.   Have you seen any other providers since your last visit? Patient stated no.  Any changes in your medications or health? Patient stated no.  Any side effects from any medications? Patient stated no.  Do you have an symptoms or problems not managed by your medications? Patient stated no.  Any concerns about your health right now?  Patient stated no.  Has your provider asked that you check blood pressure, blood sugar, or follow special diet at home? Patient stated no.  Do you get any type of exercise on a regular basis? Patient stated no but he is outside a lot.  Can you think of a goal you would like to reach for your health? Patient stated no.  Do you have any problems getting your medications? Patient stated no.  Is there anything that you would like to discuss during the appointment? Patient stated no.  Please bring medications and supplements to appointment, patient reminded of his face to face on 5/26 at 2 pm.  Follow-Up:Pharmacist Review   Charlann Lange, [place] Pharmacist Assistant [phone]

## 2020-06-03 ENCOUNTER — Ambulatory Visit (INDEPENDENT_AMBULATORY_CARE_PROVIDER_SITE_OTHER): Payer: Medicare HMO | Admitting: Pharmacist

## 2020-06-03 ENCOUNTER — Other Ambulatory Visit: Payer: Self-pay

## 2020-06-03 DIAGNOSIS — E785 Hyperlipidemia, unspecified: Secondary | ICD-10-CM | POA: Diagnosis not present

## 2020-06-03 DIAGNOSIS — I10 Essential (primary) hypertension: Secondary | ICD-10-CM | POA: Diagnosis not present

## 2020-06-03 DIAGNOSIS — E039 Hypothyroidism, unspecified: Secondary | ICD-10-CM

## 2020-06-03 DIAGNOSIS — E1169 Type 2 diabetes mellitus with other specified complication: Secondary | ICD-10-CM | POA: Diagnosis not present

## 2020-06-04 NOTE — Patient Instructions (Addendum)
Visit Information  Goals Addressed            This Visit's Progress   . Monitor and Manage My Blood Sugar-Diabetes Type 2       Timeframe:  Long-Range Goal Priority:  High Start Date:  06/04/20                           Expected End Date: 12/05/20                      Follow Up Date 08/08/20   - check blood sugar at prescribed times - check blood sugar if I feel it is too high or too low - enter blood sugar readings and medication or insulin into daily log - take the blood sugar log to all doctor visits    Why is this important?    Checking your blood sugar at home helps to keep it from getting very high or very low.   Writing the results in a diary or log helps the doctor know how to care for you.   Your blood sugar log should have the time, date and the results.   Also, write down the amount of insulin or other medicine that you take.   Other information, like what you ate, exercise done and how you were feeling, will also be helpful.     Notes:       Patient Care Plan: General Pharmacy (Adult)    Problem Identified: HTN, CAD, DM w/ neuropathy, Hypothyroidism, HLD   Priority: High  Onset Date: 06/03/2020    Long-Range Goal: Patient-Specific Goal   Start Date: 06/03/2020  Expected End Date: 12/05/2020  This Visit's Progress: On track  Priority: High  Note:   Current Barriers:  . Unable to independently afford treatment regimen . Unable to independently monitor therapeutic efficacy . Unable to achieve control of DM   Pharmacist Clinical Goal(s):  [name] Patient will verbalize ability to afford treatment regimen . achieve adherence to monitoring guidelines and medication adherence to achieve therapeutic efficacy . achieve control of DM as evidenced by home monitoring/A1c . adhere to plan to optimize therapeutic regimen for DM as evidenced by report of adherence to recommended medication management changes . adhere to prescribed medication regimen as evidenced by  fill dates through collaboration with PharmD and provider.   Interventions: . 1:1 collaboration with [name], NP regarding development and update of comprehensive plan of care as evidenced by provider attestation and co-signature . Inter-disciplinary care team collaboration (see longitudinal plan of care) . Comprehensive medication review performed; medication list updated in electronic medical record  Hypertension (BP goal <130/80) -Controlled -Current treatment: . Amlodipine 5mg  daily . Metoprolol XL 50mg  daily . Imdur 30mg  24hr daily . Lisinopril 10mg  daily -Medications previously tried: none noted -Current home readings: not logs available, dose check it and reports normal -Current dietary habits: see DM -Current exercise habits: minimal, outside walking around house alot -Denies hypotensive/hypertensive symptoms -Educated on BP goals and benefits of medications for prevention of heart attack, stroke and kidney damage; Daily salt intake goal < 2300 mg; Exercise goal of 150 minutes per week; Importance of home blood pressure monitoring; -Counseled to monitor BP at home a few times weekly, document, and provide log at future appointments -Recommended to continue current medication  Hyperlipidemia: (LDL goal < 70) -Not ideally controlled -Current treatment: . Atorvastatin 80mg  daily . Zetia 10mg  daily -Medications previously tried: simvastatin  -  Current dietary patterns: see DM -Current exercise habits: see above -Educated on Cholesterol goals;  Benefits of statin for ASCVD risk reduction; Importance of limiting foods high in cholesterol; Exercise goal of 150 minutes per week;  -LDL most recently slightly elevated, however patient was not fasting -Recommended to continue current medication Recommended repeat lipid panel for accuracy  Diabetes (A1c goal <8%) -Uncontrolled -Current medications: [name] Metformin 1000mg  BID . Levemir 34 units hs . Novolog 5 units  TID with meals . Glipizide 2.5mg  daily with breakfast -Medications previously tried: [place]  -Current home glucose readings . fasting glucose: not checking . post prandial glucose: not checking -Denies hypoglycemic/hyperglycemic symptoms -Current meal patterns:  . breakfast:   . lunch: sandwich, using white wheat bread  . dinner: home cooked meal with meat, starch, veggies . snacks:  . drinks: drinks at least two regular sodas per day -Current exercise: minimal -Educated on A1c and blood sugar goals; Complications of diabetes including kidney damage, retinal damage, and cardiovascular disease; Exercise goal of 150 minutes per week; Prevention and management of hypoglycemic episodes; Benefits of routine self-monitoring of blood sugar; Risk of not checking sugar on insulin -Counseled to check feet daily and get yearly eye exams -Counseled on diet and exercise extensively Recommended to continue current medication Recommended he start to check at least his fasting sugar for me and write it down in a log so that we can keep track.  We also discussed trying to cut back on regular soda intake and the amount of carbs he is eating with each meal.  Try to limit meals to one carbohydrate source per meal. Assessed patient finances. does report Levemir and Novolog are expensive.  He also sometimes does not take his nighttime Levemir for an unknown reason.  Stressed importance of this dose of Levemir and patient agrees to be better about taking.  Will have PAP form mailed to him for both insulins.    Hypothyroidism (Goal: Maintain TSH) -Controlled -Current treatment  . Levothyroxine 53mcg daily -Medications previously tried: none noted  -TSH normal, takes medication correctly -Recommended to continue current medication   Patient Goals/Self-Care Activities . Patient will:  - take medications as prescribed focus on medication adherence by using a pill box, setting reminders check glucose at  least fasting, document, and provide at future appointments check blood pressure a few times per week, document, and provide at future appointments engage in dietary modifications by limiting intake of sodas and carbohydrates  Follow Up Plan: The care management team will reach out to the patient again over the next 60 days.        Mr. Mcevoy was given information about Chronic Care Management services today including:  1. CCM service includes personalized support from designated clinical staff supervised by his physician, including individualized plan of care and coordination with other care providers 2. 24/7 contact phone numbers for assistance for urgent and routine care needs. 3. Standard insurance, coinsurance, copays and deductibles apply for chronic care management only during months in which we provide at least 20 minutes of these services. Most insurances cover these services at 100%, however patients may be responsible for any copay, coinsurance and/or deductible if applicable. This service may help you avoid the need for more expensive face-to-face services. 4. Only one practitioner may furnish and bill the service in a calendar month. 5. The patient may stop CCM services at any time (effective at the end of the month) by phone call to the office staff.  Patient agreed  to services and verbal consent obtained.   The patient verbalized understanding of instructions, educational materials, and care plan provided today and agreed to receive a mailed copy of patient instructions, educational materials, and care plan.  Telephone follow up appointment with pharmacy team member scheduled for: 2 months  Edythe Clarity, [facility]  Preventing Diabetes Mellitus Complications You can help to prevent or slow down problems that are caused by diabetes (diabetes mellitus). Following your diabetes plan and taking care of yourself can reduce your risk of serious or life-threatening complications. What  actions can I take to prevent diabetes complications? Diabetes management  Follow instructions from your health care providers about managing your diabetes. Your diabetes may be managed by a team of health care providers who can teach you how to care for yourself and can answer questions that you have.  Educate yourself about your condition so you can make healthy choices about eating and physical activity.  Know your target range for your blood sugar (glucose), and check your blood glucose level as often as told. Your health care provider will help you decide how often to check your blood glucose level depending on your treatment goals and how well you are meeting them.  Ask your health care provider if you should take low-dose aspirin daily and what dose is recommended for you. Taking low-dose aspirin daily is recommended to help prevent cardiovascular disease.   Controlling your blood pressure and cholesterol Your personal target blood pressure is determined based on:  Your age.  Your medicines.  How long you have had diabetes.  Any other medical conditions you have. To control your blood pressure:  Follow instructions from your health care provider about meal planning, exercise, and medicines.  Make sure your health care provider checks your blood pressure at every medical visit.  Monitor your blood pressure at home as told by your health care provider. To control your cholesterol:  Follow instructions from your health care provider about meal planning, exercise, and medicines.  Have your cholesterol checked at least once a year.  You may be prescribed medicine to lower cholesterol (statin). If you are not taking a statin, ask your health care provider if you should be. Controlling your cholesterol may:  Help prevent heart disease and stroke. These are the most common health problems for people with diabetes.  Improve your blood flow.   Medical appointments and  vaccines Schedule and keep yearly physical exams and eye exams. Your health care provider will tell you how often you need medical visits depending on your diabetes management plan. Keep all follow-up visits as told. This is important so possible problems can be identified early and complications can be avoided or treated.  Every visit with your health care provider should include measuring your: ? Weight. ? Blood pressure. ? Blood glucose control.  Your A1C (hemoglobin A1C) level should be checked: ? At least 2 times a year, if you are meeting your treatment goals. ? 4 times a year, if you are not meeting treatment goals or if your treatment goals have changed.  Your blood lipids (lipid profile) should be checked yearly. You should also be checked yearly for protein in your urine (urine microalbumin).  If you have type 1 diabetes, get an eye exam 3-5 years after you are diagnosed, and then once a year after your first exam.  If you have type 2 diabetes, get an eye exam as soon as you are diagnosed, and then once a year after  your first exam. It is also important to keep your vaccines current. It is recommended that you receive:  A flu (influenza) vaccine every year.  A pneumonia (pneumococcal) vaccine and a hepatitis B vaccine. If you are age 77 or older, you may get the pneumonia vaccine as a series of two separate shots. Ask your health care provider which other vaccines may be recommended. Lifestyle  Do not use any products that contain nicotine or tobacco, such as cigarettes, e-cigarettes, and chewing tobacco. If you need help quitting, ask your health care provider. By avoiding nicotine and tobacco: ? You will lower your risk for heart attack, stroke, nerve disease, and kidney disease. ? Your cholesterol and blood pressure may improve. ? Your blood circulation will improve.  If you drink alcohol: ? Limit how much you use to:  0-1 drink a day for women who are not  pregnant.  0-2 drinks a day for men. ? Be aware of how much alcohol is in your drink. In the [place] one drink equals one 12 oz bottle of beer (355 mL), one 5 oz glass of wine (148 mL), or one 11?2 oz glass of hard liquor (44 mL). Taking care of your feet Diabetes may cause you to have poor blood circulation to your legs and feet. Because of this, taking care of your feet is very important. Diabetes can cause:  The skin on the feet to get thinner, break more easily, and heal more slowly.  Nerve damage in your legs and feet, which results in decreased feeling. You may not notice minor injuries that could lead to serious problems. To avoid foot problems:  Check your skin and feet every day for cuts, bruises, redness, blisters, or sores.  Schedule a foot exam with your health care provider once every year. This exam includes: ? Inspecting the structure and skin of your feet. ? Checking the pulses and sensation in your feet.  Make sure that your health care provider performs a visual foot exam at every medical visit.   Taking care of your teeth People with poorly controlled diabetes are more likely to have gum (periodontal) disease. Diabetes can make periodontal diseases harder to control. If not treated, periodontal diseases can lead to tooth loss. To prevent this:  Brush your teeth twice a day.  Floss at least once a day.  Visit your dentist 2 times a year. Managing stress Living with diabetes can be stressful. When you are experiencing stress, your blood glucose may be affected in two ways:  Stress hormones may cause your blood glucose to rise.  You may be distracted from taking good care of yourself. Be aware of your stress level and make changes to help you manage challenging situations. To lower your stress levels:  Consider joining a support group.  Do planned relaxation or meditation.  Do a hobby that you enjoy.  Maintain healthy relationships.  Exercise  regularly.  Work with your health care provider or a mental health professional. Where to find more information  American Diabetes Association: www.diabetes.org  Association of Diabetes Care and Education Specialists: www.diabeteseducator.org Summary  You can take action to prevent or slow down problems that are caused by diabetes (diabetes mellitus). Following your diabetes plan and taking care of yourself can reduce your risk of serious or life-threatening complications.  Follow instructions from your health care providers about managing your diabetes. Your diabetes may be managed by a team of health care providers who can teach you how to care for  yourself and can answer questions that you have.  Know your target range for your blood sugar (glucose), and check your blood glucose levels as often as told. Your health care provider will help you decide how often you should check your blood glucose level depending on your treatment goals and how well you are meeting them.  Your health care provider will tell you how often you need medical visits depending on your diabetes management plan. Keep all follow-up visits as directed. This is important so possible problems can be identified early and complications can be avoided or treated. This information is not intended to replace advice given to you by your health care provider. Make sure you discuss any questions you have with your health care provider. Document Revised: 02/14/2019 Document Reviewed: 02/14/2019 Elsevier Patient Education  2021 [employer].

## 2020-06-09 ENCOUNTER — Other Ambulatory Visit: Payer: Self-pay

## 2020-06-09 ENCOUNTER — Ambulatory Visit (INDEPENDENT_AMBULATORY_CARE_PROVIDER_SITE_OTHER): Payer: PRIVATE HEALTH INSURANCE | Admitting: Nurse Practitioner

## 2020-06-09 ENCOUNTER — Telehealth: Payer: Self-pay | Admitting: Pharmacist

## 2020-06-09 ENCOUNTER — Encounter: Payer: Self-pay | Admitting: Nurse Practitioner

## 2020-06-09 VITALS — BP 108/72 | HR 84 | Temp 98.1°F | Ht 78.0 in | Wt 218.2 lb

## 2020-06-09 DIAGNOSIS — R300 Dysuria: Secondary | ICD-10-CM | POA: Diagnosis not present

## 2020-06-09 LAB — URINALYSIS, ROUTINE W REFLEX MICROSCOPIC
Bilirubin Urine: NEGATIVE
Hgb urine dipstick: NEGATIVE
Ketones, ur: NEGATIVE
Leukocytes,Ua: NEGATIVE
Nitrite: NEGATIVE
Protein, ur: NEGATIVE
Specific Gravity, Urine: 1.015 (ref 1.001–1.035)
pH: 7 (ref 5.0–8.0)

## 2020-06-09 LAB — URINALYSIS, MICROSCOPIC ONLY
Bacteria, UA: NONE SEEN /HPF
Hyaline Cast: NONE SEEN /LPF
RBC / HPF: NONE SEEN /HPF (ref 0–2)
Squamous Epithelial / HPF: NONE SEEN /HPF (ref <=5)
WBC, UA: NONE SEEN /HPF (ref 0–5)

## 2020-06-09 NOTE — Progress Notes (Signed)
Subjective:    Patient ID: Dylan Robles, male    DOB: November 27, 1940, 80 y.o.   MRN: [id number]  HPI: Dylan Robles is a 80 y.o. male presenting for burning with urination.  Chief Complaint  Patient presents with  . burning urination    For 1 wk   URINARY SYMPTOMS Patient reports burning started about a week ago today.  His wife reports she started encouraging him to drink more water at that time and since then, the burning has completely resolved.  He is not having any burning today.  States that the burning stopped about 3 to 4 days ago or more. Duration: 1 week Dysuria: mostly in the morning when first going; completely resolved now Urinary frequency: yes; resolved now Urgency: yes; usually 3am-7am, better now Small volume voids: no Symptom severity: mild Urinary incontinence: no Foul odor: no Hematuria: no Abdominal pain: no Back pain: no Suprapubic pain/pressure: no Flank pain: no Fever:  No Nausea: no Vomiting: no Relief with cranberry juice: no Relief with pyridium: not tried Status: better Previous urinary tract infection: yes - long time ago Recurrent urinary tract infection: no Penile discharge: no Treatments attempted: increased water   Allergies  Allergen Reactions  . Plavix [Clopidogrel] Hives    Outpatient Encounter Medications as of 06/09/2020  Medication Sig  . amLODipine (NORVASC) 5 MG tablet Take 1 tablet (5 mg total) by mouth daily.  [name] aspirin EC 81 MG tablet Take 81 mg by mouth daily.   [name] atorvastatin (LIPITOR) 80 MG tablet Take 1 tablet (80 mg total) by mouth at bedtime.  . Blood Glucose Calibration (TRUE METRIX LEVEL 1) Low SOLN 1 each by In Vitro route as directed.  . Blood Glucose Monitoring Suppl (TRUE METRIX AIR GLUCOSE METER) w/Device KIT 1 each by Does not apply route as directed.  [name] BRILINTA 60 MG TABS tablet Take 60 mg by mouth 2 (two) times daily.   . diclofenac Sodium (VOLTAREN) 1 % GEL Apply 2 g topically 2 (two) times daily as needed.   . DROPLET INSULIN SYRINGE 30G X 5/16" 1 ML MISC USE AS DIRECTED  . ezetimibe (ZETIA) 10 MG tablet Take 10 mg by mouth daily.  . furosemide (LASIX) 20 MG tablet Take 20 mg by mouth as needed.  . gabapentin (NEURONTIN) 300 MG capsule Take 1 capsule (300 mg total) by mouth 3 (three) times daily.  [name] glipiZIDE (GLUCOTROL) 5 MG tablet Take 0.5 tablets (2.5 mg total) by mouth daily before breakfast.  . glucose blood test strip 1 each by Other route as needed. Use as instructed  . insulin aspart (NOVOLOG) 100 UNIT/ML injection Inject 5 Units into the skin 3 (three) times daily before meals.   . isosorbide mononitrate (IMDUR) 30 MG 24 hr tablet Take 1 tablet (30 mg total) by mouth daily.  [name] ketoconazole (NIZORAL) 2 % cream Apply 1 application topically daily.  [name] ketorolac (ACULAR) 0.5 % ophthalmic solution   . LEVEMIR FLEXTOUCH 100 UNIT/ML Pen Inject 34 Units into the skin at bedtime.   [name] levothyroxine (SYNTHROID) 88 MCG tablet Take 1 tablet (88 mcg total) by mouth daily before breakfast.  . lisinopril (ZESTRIL) 10 MG tablet Take 1 tablet (10 mg total) by mouth daily.  . metFORMIN (GLUCOPHAGE) 1000 MG tablet Take 1 tablet (1,000 mg total) by mouth 2 (two) times daily with a meal.  . metoprolol succinate (TOPROL-XL) 50 MG 24 hr tablet Take 1 tablet (50 mg total) by mouth daily.  [name]  ofloxacin (OCUFLOX) 0.3 % ophthalmic solution   . prednisoLONE acetate (PRED FORTE) 1 % ophthalmic suspension   . tamsulosin (FLOMAX) 0.4 MG CAPS capsule Take 1 capsule (0.4 mg total) by mouth at bedtime.   No facility-administered encounter medications on file as of 06/09/2020.    Patient Active Problem List   Diagnosis Date Noted  . Stage 3a chronic kidney disease ([place]) 03/03/2020  . Left arm pain 01/29/2020  . Atherosclerosis of native arteries of extremity with intermittent claudication ([place]) 12/19/2018  . Hyperlipidemia 12/19/2018  . Diabetic neuropathy ([place]) 09/26/2018  . Type 2 diabetes mellitus with vascular  disease ([place]) 09/26/2018  . Coronary artery disease of native artery of native heart with stable angina pectoris ([place]) 09/20/2017  . Type 2 diabetes mellitus with hyperlipidemia ([place]) 09/16/2008  . Hypothyroidism 03/29/2006  . Essential hypertension 01/13/2006  . PERIPHERAL VASCULAR DISEASE 01/13/2006    Past Medical History:  Diagnosis Date  . Acute lower UTI 09/20/2017  . Bradycardia on ECG 07/31/2011  . Coronary artery disease    hyperlipidemia  . Decreased GFR 09/23/2019  . Dehydration 09/21/2017  . Diabetes mellitus   . Diabetes mellitus without complication ([place])   . Hypercholesterolemia   . Hypertension   . Hypothyroidism   . Paronychia of great toe of left foot 09/26/2018  . Paronychia of great toe, left 09/26/2018  . PERIPHERAL NEUROPATHY 01/13/2006   Qualifier: Diagnosis of  By: Truett Mainland MD, Christine    . Shortness of breath dyspnea    with exertion  . Vasovagal syncope   . Weakness 09/20/2017    Relevant past medical, surgical, family and social history reviewed and updated as indicated. Interim medical history since our last visit reviewed.  Review of Systems Per HPI unless specifically indicated above     Objective:    BP 108/72   Pulse 84   Temp 98.1 F (36.7 C)   Ht $R'6\' 6"'we$  (1.981 m)   Wt 218 lb 3.2 oz (99 kg)   SpO2 97%   BMI 25.22 kg/m   Wt Readings from Last 3 Encounters:  06/09/20 218 lb 3.2 oz (99 kg)  04/29/20 219 lb (99.3 kg)  03/31/20 219 lb 9.6 oz (99.6 kg)    Physical Exam Vitals and nursing note reviewed.  Constitutional:      General: He is not in acute distress.    Appearance: Normal appearance. He is not toxic-appearing.  Abdominal:     General: Abdomen is flat. Bowel sounds are normal. There is no distension.     Palpations: Abdomen is soft.     Tenderness: There is no abdominal tenderness. There is no right CVA tenderness, left CVA tenderness, guarding or rebound.  Skin:    General: Skin is warm.     Capillary Refill: Capillary refill  takes less than 2 seconds.     Coloration: Skin is not jaundiced or pale.     Findings: No erythema.  Neurological:     Mental Status: He is alert and oriented to person, place, and time.     Motor: No weakness.     Gait: Gait normal.  Psychiatric:        Mood and Affect: Mood normal.        Behavior: Behavior normal.        Thought Content: Thought content normal.        Judgment: Judgment normal.       Assessment & Plan:  1. Burning with urination Acute, resolved.  UA today  unremarkable with exception of 2+ glucose.  Patient has known diabetes and is working on cutting back on simple sugars and carbohydrates.  Microscopic exam of urine today does not show any abnormalities.  I do not think that the patient has or had an acute urinary tract infection.  It seems most likely that the patient's burning may have been related to low water intake.  I encouraged patient hydration with plenty of water.  Gatorade 0 sugar is an alternative.  Follow-up as scheduled next month.  Return to clinic if symptoms return in the meantime.  - Urinalysis, Routine w reflex microscopic - Urinalysis, microscopic only     Follow up plan: Return for as scheduled.

## 2020-06-09 NOTE — Progress Notes (Addendum)
    Chronic Care Management Pharmacy Assistant   Name: Dylan Robles  MRN: [id number] DOB: [birth year]  Reason for Encounter: PAP  Medications: Outpatient Encounter Medications as of 06/09/2020  Medication Sig   amLODipine (NORVASC) 5 MG tablet Take 1 tablet (5 mg total) by mouth daily.   aspirin EC 81 MG tablet Take 81 mg by mouth daily.    atorvastatin (LIPITOR) 80 MG tablet Take 1 tablet (80 mg total) by mouth at bedtime.   Blood Glucose Calibration (TRUE METRIX LEVEL 1) Low SOLN 1 each by In Vitro route as directed.   Blood Glucose Monitoring Suppl (TRUE METRIX AIR GLUCOSE METER) w/Device KIT 1 each by Does not apply route as directed.   BRILINTA 60 MG TABS tablet Take 60 mg by mouth 2 (two) times daily.    diclofenac Sodium (VOLTAREN) 1 % GEL Apply 2 g topically 2 (two) times daily as needed.   DROPLET INSULIN SYRINGE 30G X 5/16" 1 ML MISC USE AS DIRECTED   ezetimibe (ZETIA) 10 MG tablet Take 10 mg by mouth daily.   furosemide (LASIX) 20 MG tablet Take 20 mg by mouth as needed.   gabapentin (NEURONTIN) 300 MG capsule Take 1 capsule (300 mg total) by mouth 3 (three) times daily.   glipiZIDE (GLUCOTROL) 5 MG tablet Take 0.5 tablets (2.5 mg total) by mouth daily before breakfast.   glucose blood test strip 1 each by Other route as needed. Use as instructed   insulin aspart (NOVOLOG) 100 UNIT/ML injection Inject 5 Units into the skin 3 (three) times daily before meals.    isosorbide mononitrate (IMDUR) 30 MG 24 hr tablet Take 1 tablet (30 mg total) by mouth daily.   ketoconazole (NIZORAL) 2 % cream Apply 1 application topically daily.   ketorolac (ACULAR) 0.5 % ophthalmic solution    LEVEMIR FLEXTOUCH 100 UNIT/ML Pen Inject 34 Units into the skin at bedtime.    levothyroxine (SYNTHROID) 88 MCG tablet Take 1 tablet (88 mcg total) by mouth daily before breakfast.   lisinopril (ZESTRIL) 10 MG tablet Take 1 tablet (10 mg total) by mouth daily.   metFORMIN (GLUCOPHAGE) 1000 MG tablet Take 1  tablet (1,000 mg total) by mouth 2 (two) times daily with a meal.   metoprolol succinate (TOPROL-XL) 50 MG 24 hr tablet Take 1 tablet (50 mg total) by mouth daily.   ofloxacin (OCUFLOX) 0.3 % ophthalmic solution    prednisoLONE acetate (PRED FORTE) 1 % ophthalmic suspension    tamsulosin (FLOMAX) 0.4 MG CAPS capsule Take 1 capsule (0.4 mg total) by mouth at bedtime.   No facility-administered encounter medications on file as of 06/09/2020.   New patient assistance application form filled out to [employer] for [name]. Waiting for patient and provider to complete and sign documentation. Called patient to inquire if they wanted the application mailed to them or if they wanted to come into the office. Patient is required to sign application and to bring/have proof of income. He stated he would be willing to come into office to bring proof of income and sign application once the paperwork came in the mail he/she would like it mailed to their residence address [place] [address].  Follow-Up:Pharmacist Review   Charlann Lange, RMA Clinical Pharmacist Assistant [phone]  5 minutes spent in review, coordination, and documentation.  Reviewed by: Beverly Milch, PharmD Clinical Pharmacist [facility] [phone]

## 2020-06-14 DIAGNOSIS — D649 Anemia, unspecified: Secondary | ICD-10-CM | POA: Diagnosis not present

## 2020-06-14 DIAGNOSIS — E039 Hypothyroidism, unspecified: Secondary | ICD-10-CM | POA: Diagnosis not present

## 2020-06-14 DIAGNOSIS — M1990 Unspecified osteoarthritis, unspecified site: Secondary | ICD-10-CM | POA: Diagnosis not present

## 2020-06-14 DIAGNOSIS — Z9842 Cataract extraction status, left eye: Secondary | ICD-10-CM | POA: Diagnosis not present

## 2020-06-14 DIAGNOSIS — E785 Hyperlipidemia, unspecified: Secondary | ICD-10-CM | POA: Diagnosis not present

## 2020-06-14 DIAGNOSIS — I10 Essential (primary) hypertension: Secondary | ICD-10-CM | POA: Diagnosis not present

## 2020-06-14 DIAGNOSIS — E119 Type 2 diabetes mellitus without complications: Secondary | ICD-10-CM | POA: Diagnosis not present

## 2020-06-14 DIAGNOSIS — E1136 Type 2 diabetes mellitus with diabetic cataract: Secondary | ICD-10-CM | POA: Diagnosis not present

## 2020-06-14 DIAGNOSIS — H2511 Age-related nuclear cataract, right eye: Secondary | ICD-10-CM | POA: Diagnosis not present

## 2020-06-14 DIAGNOSIS — I2510 Atherosclerotic heart disease of native coronary artery without angina pectoris: Secondary | ICD-10-CM | POA: Diagnosis not present

## 2020-06-25 ENCOUNTER — Telehealth: Payer: Self-pay | Admitting: Pharmacist

## 2020-06-25 NOTE — Progress Notes (Addendum)
Chronic Care Management Pharmacy Assistant   Name: Dylan Robles  MRN: [id number] DOB: 10-14-1940  Reason for Encounter: Disease State For DM   Conditions to be addressed/monitored: HTN, CAD, DM w/ neuropathy, Hypothyroidism, HLD  Recent office visits:  06/09/20 [name], NP. For burning with urination. No medication changes.  Recent consult visits:  None since 06/01/20  Hospital visits:  None since 06/01/20  Medications: Outpatient Encounter Medications as of 06/25/2020  Medication Sig   amLODipine (NORVASC) 5 MG tablet Take 1 tablet (5 mg total) by mouth daily.   aspirin EC 81 MG tablet Take 81 mg by mouth daily.    atorvastatin (LIPITOR) 80 MG tablet Take 1 tablet (80 mg total) by mouth at bedtime.   Blood Glucose Calibration (TRUE METRIX LEVEL 1) Low SOLN 1 each by In Vitro route as directed.   Blood Glucose Monitoring Suppl (TRUE METRIX AIR GLUCOSE METER) w/Device KIT 1 each by Does not apply route as directed.   BRILINTA 60 MG TABS tablet Take 60 mg by mouth 2 (two) times daily.    diclofenac Sodium (VOLTAREN) 1 % GEL Apply 2 g topically 2 (two) times daily as needed.   DROPLET INSULIN SYRINGE 30G X 5/16" 1 ML MISC USE AS DIRECTED   ezetimibe (ZETIA) 10 MG tablet Take 10 mg by mouth daily.   furosemide (LASIX) 20 MG tablet Take 20 mg by mouth as needed.   gabapentin (NEURONTIN) 300 MG capsule Take 1 capsule (300 mg total) by mouth 3 (three) times daily.   glipiZIDE (GLUCOTROL) 5 MG tablet Take 0.5 tablets (2.5 mg total) by mouth daily before breakfast.   glucose blood test strip 1 each by Other route as needed. Use as instructed   insulin aspart (NOVOLOG) 100 UNIT/ML injection Inject 5 Units into the skin 3 (three) times daily before meals.    isosorbide mononitrate (IMDUR) 30 MG 24 hr tablet Take 1 tablet (30 mg total) by mouth daily.   ketoconazole (NIZORAL) 2 % cream Apply 1 application topically daily.   ketorolac (ACULAR) 0.5 % ophthalmic solution    LEVEMIR  FLEXTOUCH 100 UNIT/ML Pen Inject 34 Units into the skin at bedtime.    levothyroxine (SYNTHROID) 88 MCG tablet Take 1 tablet (88 mcg total) by mouth daily before breakfast.   lisinopril (ZESTRIL) 10 MG tablet Take 1 tablet (10 mg total) by mouth daily.   metFORMIN (GLUCOPHAGE) 1000 MG tablet Take 1 tablet (1,000 mg total) by mouth 2 (two) times daily with a meal.   metoprolol succinate (TOPROL-XL) 50 MG 24 hr tablet Take 1 tablet (50 mg total) by mouth daily.   ofloxacin (OCUFLOX) 0.3 % ophthalmic solution    prednisoLONE acetate (PRED FORTE) 1 % ophthalmic suspension    tamsulosin (FLOMAX) 0.4 MG CAPS capsule Take 1 capsule (0.4 mg total) by mouth at bedtime.   No facility-administered encounter medications on file as of 06/25/2020.    Recent Relevant Labs: Lab Results  Component Value Date/Time   HGBA1C 10.5 (H) 04/29/2020 11:41 AM   HGBA1C 8.8 (H) 01/29/2020 11:06 AM   MICROALBUR 1.4 09/22/2019 04:13 PM   MICROALBUR 0.67 12/17/2006 09:03 PM    Kidney Function Lab Results  Component Value Date/Time   CREATININE 1.23 (H) 04/29/2020 11:41 AM   CREATININE 1.30 (H) 01/29/2020 11:06 AM   GFRNONAA 54 (L) 04/29/2020 11:41 AM   GFRAA 63 04/29/2020 11:41 AM    Current antihyperglycemic regimen:  Metformin 1000mg  BID Levemir 34 units hs Novolog  5 units TID with meals Glipizide 2.$RemoveBeforeDEI'5mg'[id number]$  daily with breakfast  What recent interventions/DTPs have been made to improve glycemic control:  None  Have there been any recent hospitalizations or ED visits since last visit with CPP? Patients wife stated no.  Patients wife denies hypoglycemic symptoms, including None  Patients wife denies hyperglycemic symptoms, including none  How often are you checking your blood sugar?  Patients wife stated  once daily  What are your blood sugars ranging?  Patients wife stated 130 on 06/24/20  During the week, how often does your blood glucose drop below 70? Patients wife stated Never.  Are you checking  your feet daily/regularly?  Patients wife stated she monitors his lower extremities regularly.   Adherence Review: Is the patient currently on a STATIN medication? Atorvastatin 80 mg   Is the patient currently on ACE/ARB medication? Lisinopril 10 mg  Does the patient have >5 day gap between last estimated fill dates? Per msic rtps, yes but per patient no.  Star Rating Drugs:Glipizide 5 mg , Lisinopril 10 mg 90 DS 08/13/19, Metformin 1000 mg 90 DS 06/04/19, Atorvastatin 80 mg 90 DS 07/17/19.  Patients wife stated he is cutting back on drinking sodas and has been drinking more water which is improved his burning in his urine.   Follow-Up:Pharmacist Review   Charlann Lange, RMA Clinical Pharmacist Assistant [phone]  10 minutes spent in review, coordination, and documentation.  Reviewed by: Beverly Milch, PharmD Clinical Pharmacist [facility] [phone]

## 2020-06-28 DIAGNOSIS — I2510 Atherosclerotic heart disease of native coronary artery without angina pectoris: Secondary | ICD-10-CM | POA: Diagnosis not present

## 2020-06-28 DIAGNOSIS — Z87891 Personal history of nicotine dependence: Secondary | ICD-10-CM | POA: Diagnosis not present

## 2020-06-28 DIAGNOSIS — I10 Essential (primary) hypertension: Secondary | ICD-10-CM | POA: Diagnosis not present

## 2020-06-29 DIAGNOSIS — R0602 Shortness of breath: Secondary | ICD-10-CM | POA: Diagnosis not present

## 2020-06-29 DIAGNOSIS — I2510 Atherosclerotic heart disease of native coronary artery without angina pectoris: Secondary | ICD-10-CM | POA: Diagnosis not present

## 2020-06-29 DIAGNOSIS — R5383 Other fatigue: Secondary | ICD-10-CM | POA: Diagnosis not present

## 2020-06-29 DIAGNOSIS — G912 (Idiopathic) normal pressure hydrocephalus: Secondary | ICD-10-CM | POA: Diagnosis not present

## 2020-06-29 DIAGNOSIS — E785 Hyperlipidemia, unspecified: Secondary | ICD-10-CM | POA: Diagnosis not present

## 2020-06-29 DIAGNOSIS — I10 Essential (primary) hypertension: Secondary | ICD-10-CM | POA: Diagnosis not present

## 2020-07-02 ENCOUNTER — Other Ambulatory Visit: Payer: Self-pay | Admitting: *Deleted

## 2020-07-02 DIAGNOSIS — I10 Essential (primary) hypertension: Secondary | ICD-10-CM

## 2020-07-02 DIAGNOSIS — E039 Hypothyroidism, unspecified: Secondary | ICD-10-CM

## 2020-07-02 MED ORDER — AMLODIPINE BESYLATE 5 MG PO TABS: 5.0000 mg | ORAL_TABLET | Freq: Every day | ORAL | 1 refills | Status: DC

## 2020-07-02 MED ORDER — LEVOTHYROXINE SODIUM 88 MCG PO TABS: 88.0000 ug | ORAL_TABLET | Freq: Every day | ORAL | 1 refills | Status: DC

## 2020-07-06 ENCOUNTER — Other Ambulatory Visit: Payer: Self-pay

## 2020-07-06 DIAGNOSIS — Z87438 Personal history of other diseases of male genital organs: Secondary | ICD-10-CM

## 2020-07-06 MED ORDER — TAMSULOSIN HCL 0.4 MG PO CAPS: 0.4000 mg | ORAL_CAPSULE | Freq: Every day | ORAL | 1 refills | Status: DC

## 2020-07-15 ENCOUNTER — Other Ambulatory Visit: Payer: Self-pay | Admitting: Nurse Practitioner

## 2020-07-15 DIAGNOSIS — E782 Mixed hyperlipidemia: Secondary | ICD-10-CM

## 2020-07-15 DIAGNOSIS — E1159 Type 2 diabetes mellitus with other circulatory complications: Secondary | ICD-10-CM

## 2020-07-16 DIAGNOSIS — G4733 Obstructive sleep apnea (adult) (pediatric): Secondary | ICD-10-CM | POA: Diagnosis not present

## 2020-07-21 ENCOUNTER — Other Ambulatory Visit: Payer: Self-pay | Admitting: Nurse Practitioner

## 2020-07-21 DIAGNOSIS — I10 Essential (primary) hypertension: Secondary | ICD-10-CM

## 2020-07-21 DIAGNOSIS — E039 Hypothyroidism, unspecified: Secondary | ICD-10-CM

## 2020-07-26 ENCOUNTER — Other Ambulatory Visit: Payer: Self-pay | Admitting: Nurse Practitioner

## 2020-07-26 DIAGNOSIS — I10 Essential (primary) hypertension: Secondary | ICD-10-CM

## 2020-07-28 ENCOUNTER — Other Ambulatory Visit: Payer: Self-pay | Admitting: Nurse Practitioner

## 2020-07-28 DIAGNOSIS — E1169 Type 2 diabetes mellitus with other specified complication: Secondary | ICD-10-CM

## 2020-08-01 NOTE — Progress Notes (Signed)
Subjective:    Patient ID: Dylan Robles, male    DOB: [birth year], 80 y.o.   MRN: [id number]  HPI: Dylan Robles is a 80 y.o. male presenting with spouse, [name], for follow up.  Chief Complaint  Patient presents with   Diabetes    Follow up, pt is not fasting   Had cataract removal surgery with Dr. Pennie Banter in May 2022.  Reports his vision has improved significantly.   HYPERTENSION / HYPERLIPIDEMIA Currently taking amlodipine 5 mg daily, lasix 20 mg daily as needed, imdur 30 mg daily, lisinopril 10 mg daily, metoprolol succinate 50 mg daily for blood pressure.  Taking atorvastatin 80 mg daily, brilinta 60 mg daily, zetia 10 mg daily for cholesterol.  Follows with Cardiology and Vascular surgery yearly. Satisfied with current treatment? yes Duration of hypertension: chronic BP monitoring frequency: not checking BP medication side effects: no Duration of hyperlipidemia: chronic Cholesterol medication side effects: no Aspirin: yes Recent stressors: no Recurrent headaches: no Visual changes: no Palpitations: no Dyspnea: no Chest pain: no Lower extremity edema: yes Dizzy/lightheaded: no  DIABETES Has cut out soda.  Thinks A1c last time was elevated because he was drinking soda.  Hypoglycemic episodes:no Polydipsia/polyuria: no Visual disturbance: no Chest pain: no Paresthesias:  yes; in feet sometimes but not very bothersome Glucose Monitoring: not at all   Accucheck frequency: not at all Taking Insulin?: yes  Long acting insulin: 34 units at bedtime  Short acting insulin: 6 with meals Retinal Examination: Up to Date Foot Exam: Up to Date Diabetic Education: Completed Pneumovax: Up to Date Influenza: Up to Date Aspirin: yes  CHRONIC KIDNEY DISEASE CKD status: stable Medications renally dose: yes Previous renal evaluation: no Pneumovax:  Up to Date Influenza Vaccine:  Up to Date  Allergies  Allergen Reactions   Plavix [Clopidogrel] Hives    Outpatient  Encounter Medications as of 08/02/2020  Medication Sig   amLODipine (NORVASC) 5 MG tablet TAKE 1 TABLET EVERY DAY   aspirin EC 81 MG tablet Take 81 mg by mouth daily.    atorvastatin (LIPITOR) 80 MG tablet TAKE 1 TABLET AT BEDTIME.   Blood Glucose Calibration (TRUE METRIX LEVEL 1) Low SOLN 1 each by In Vitro route as directed.   Blood Glucose Monitoring Suppl (TRUE METRIX AIR GLUCOSE METER) w/Device KIT 1 each by Does not apply route as directed.   BRILINTA 60 MG TABS tablet Take 60 mg by mouth 2 (two) times daily.    diclofenac Sodium (VOLTAREN) 1 % GEL Apply 2 g topically 2 (two) times daily as needed.   DROPLET INSULIN SYRINGE 30G X 5/16" 1 ML MISC USE AS DIRECTED   ezetimibe (ZETIA) 10 MG tablet Take 10 mg by mouth daily.   furosemide (LASIX) 20 MG tablet Take 20 mg by mouth as needed.   gabapentin (NEURONTIN) 300 MG capsule Take 1 capsule (300 mg total) by mouth 3 (three) times daily.   glipiZIDE (GLUCOTROL) 5 MG tablet TAKE 1/2 TABLET DAILY BEFORE BREAKFAST   glucose blood test strip 1 each by Other route as needed. Use as instructed   insulin aspart (NOVOLOG) 100 UNIT/ML injection Inject 5 Units into the skin 3 (three) times daily before meals.    isosorbide mononitrate (IMDUR) 30 MG 24 hr tablet Take 1 tablet (30 mg total) by mouth daily.   ketoconazole (NIZORAL) 2 % cream Apply 1 application topically daily.   ketorolac (ACULAR) 0.5 % ophthalmic solution    LEVEMIR FLEXTOUCH 100 UNIT/ML Pen Inject  34 Units into the skin at bedtime.    levothyroxine (SYNTHROID) 88 MCG tablet TAKE 1 TABLET DAILY BEFORE BREAKFAST.   lisinopril (ZESTRIL) 10 MG tablet TAKE 1 TABLET EVERY DAY   metFORMIN (GLUCOPHAGE) 1000 MG tablet TAKE 1 TABLET 2 TIMES DAILY WITH A MEAL   metoprolol succinate (TOPROL-XL) 50 MG 24 hr tablet Take 1 tablet (50 mg total) by mouth daily.   ofloxacin (OCUFLOX) 0.3 % ophthalmic solution    tamsulosin (FLOMAX) 0.4 MG CAPS capsule Take 1 capsule (0.4 mg total) by mouth at bedtime.    [DISCONTINUED] prednisoLONE acetate (PRED FORTE) 1 % ophthalmic suspension    No facility-administered encounter medications on file as of 08/02/2020.    Patient Active Problem List   Diagnosis Date Noted   Stage 3a chronic kidney disease ([facility]) 03/03/2020   Atherosclerosis of native arteries of extremity with intermittent claudication ([place]) 12/19/2018   Hyperlipidemia 12/19/2018   Diabetic neuropathy ([facility]) 09/26/2018   Type 2 diabetes mellitus with vascular disease ([place]) 09/26/2018   Coronary artery disease of native artery of native heart with stable angina pectoris ([facility]) 09/20/2017   Type 2 diabetes mellitus with hyperlipidemia ([place]) 09/16/2008   Hypothyroidism 03/29/2006   Essential hypertension 01/13/2006   PERIPHERAL VASCULAR DISEASE 01/13/2006    Past Medical History:  Diagnosis Date   Acute lower UTI 09/20/2017   Bradycardia on ECG 07/31/2011   Coronary artery disease    hyperlipidemia   Decreased GFR 09/23/2019   Dehydration 09/21/2017   Diabetes mellitus    Diabetes mellitus without complication ([place])    Hypercholesterolemia    Hypertension    Hypothyroidism    Left arm pain 01/29/2020   Paronychia of great toe of left foot 09/26/2018   Paronychia of great toe, left 09/26/2018   PERIPHERAL NEUROPATHY 01/13/2006   Qualifier: Diagnosis of  By: Truett Mainland MD, Christine     Shortness of breath dyspnea    with exertion   Vasovagal syncope    Weakness 09/20/2017    Relevant past medical, surgical, family and social history reviewed and updated as indicated. Interim medical history since our last visit reviewed.  Review of Systems Per HPI unless specifically indicated above     Objective:    BP 132/82   Pulse 65   Temp 98.7 F (37.1 C)   Ht _0  (1.981 m)   Wt 221 lb 9.6 oz (100.5 kg)   SpO2 98%   BMI 25.61 kg/m   Wt Readings from Last 3 Encounters:  08/02/20 221 lb 9.6 oz (100.5 kg)  06/09/20 218 lb 3.2 oz (99 kg)  04/29/20 219 lb (99.3 kg)    Physical  Exam Vitals and nursing note reviewed.  Constitutional:      General: He is not in acute distress.    Appearance: Normal appearance. He is normal weight. He is not toxic-appearing.  HENT:     Head: Normocephalic and atraumatic.  Eyes:     General: No scleral icterus.    Extraocular Movements: Extraocular movements intact.  Neck:     Vascular: No carotid bruit.  Cardiovascular:     Rate and Rhythm: Normal rate and regular rhythm.     Heart sounds: Normal heart sounds. No murmur heard. Pulmonary:     Effort: Pulmonary effort is normal. No respiratory distress.     Breath sounds: Normal breath sounds. No wheezing, rhonchi or rales.  Musculoskeletal:     Cervical back: Normal range of motion.     Right lower leg:  Edema present.     Left lower leg: Edema present.  Skin:    General: Skin is warm and dry.     Capillary Refill: Capillary refill takes less than 2 seconds.     Coloration: Skin is not jaundiced or pale.     Findings: No erythema.  Neurological:     General: No focal deficit present.     Mental Status: He is alert and oriented to person, place, and time.     Motor: No weakness.     Gait: Gait (Walks with cane) normal.  Psychiatric:        Mood and Affect: Mood normal.        Behavior: Behavior normal.        Thought Content: Thought content normal.        Judgment: Judgment normal.      Assessment & Plan:   Problem List Items Addressed This Visit       Cardiovascular and Mediastinum   Type 2 diabetes mellitus with vascular disease ([place])    Chronic.  Last A1c in April 2022 was 10.5%, at that time he was not taking his night time insulin consistently.  He is still not checking his blood sugars.  I recommended he check his blood sugars at least 3 times daily and report the readings to me in 2 weeks.  We will check A1c today; he has cut out soda and hopefully we will see an improvement in A1c, goal is less than 8%.  I discussed with him the importance of having good  control of blood sugar levels and we do not know which insulin to adjust if he is not checking his blood sugar at home. Foot exam and eye exam are up to date.  He follows closely with podiatry for diabetic foot care.  Taking statin regularly.  Plan to follow up in 3 months or sooner pending blood work.       Essential hypertension    Chronic. BP at goal of less than 140/90 today in clinic.  Will plan to continue current medications and collaboration with Cardiology.  Will check electrolytes with kidney function today. Follow up in 3 months.       Relevant Orders   COMPLETE METABOLIC PANEL WITH GFR   Lipid panel     Endocrine   Type 2 diabetes mellitus with hyperlipidemia ([place]) - Primary    Chronic.  Last A1c in April 2022 was 10.5%, at that time he was not taking his night time insulin consistently.  He is still not checking his blood sugars.  I recommended he check his blood sugars at least 3 times daily and report the readings to me in 2 weeks.  We will check A1c today; he has cut out soda and hopefully we will see an improvement in A1c.  Goal is less than 8%.  I discussed with him the importance of having good control of blood sugar levels and we do not know which insulin to adjust if he is not checking his blood sugar at home. Foot exam and eye exam are up to date.  He follows closely with podiatry for diabetic foot care.  Taking statin regularly.  Plan to follow up in 3 months or sooner pending blood work.  May consider referral to endocrinology.       Relevant Orders   Hemoglobin A1c   COMPLETE METABOLIC PANEL WITH GFR   Lipid panel     Genitourinary   Stage 3a chronic  kidney disease ([facility])    Chronic.  Will recheck kidney function today.  Discussed importance of controlling diabetes to prevent further kidney damage.  If kidney function remains elevated/if it has worsened, consider stopping Metformin and nephrology consultation.  Follow up in 3 months.         Other    Hyperlipidemia    Chronic.  Will check lipids today although patient is not fasting.  Follows with Cardiology.  Will plan to continue atorvastatin 80 mg daily, zetia 10 mg daily for now.  Consider Repatha vs. Leqvio injection if LDL not below goal of less than 70.  Follow up in 3 months.         Follow up plan: Return in about 3 months (around 11/02/2020) for 3 months HTN/HLD/DM.

## 2020-08-02 ENCOUNTER — Ambulatory Visit (INDEPENDENT_AMBULATORY_CARE_PROVIDER_SITE_OTHER): Payer: PRIVATE HEALTH INSURANCE | Admitting: Nurse Practitioner

## 2020-08-02 ENCOUNTER — Other Ambulatory Visit: Payer: Self-pay

## 2020-08-02 ENCOUNTER — Encounter: Payer: Self-pay | Admitting: Nurse Practitioner

## 2020-08-02 VITALS — BP 132/82 | HR 65 | Temp 98.7°F | Ht 78.0 in | Wt 221.6 lb

## 2020-08-02 DIAGNOSIS — E785 Hyperlipidemia, unspecified: Secondary | ICD-10-CM

## 2020-08-02 DIAGNOSIS — E1169 Type 2 diabetes mellitus with other specified complication: Secondary | ICD-10-CM

## 2020-08-02 DIAGNOSIS — N1831 Chronic kidney disease, stage 3a: Secondary | ICD-10-CM | POA: Diagnosis not present

## 2020-08-02 DIAGNOSIS — I10 Essential (primary) hypertension: Secondary | ICD-10-CM | POA: Diagnosis not present

## 2020-08-02 DIAGNOSIS — E1159 Type 2 diabetes mellitus with other circulatory complications: Secondary | ICD-10-CM | POA: Diagnosis not present

## 2020-08-02 DIAGNOSIS — E782 Mixed hyperlipidemia: Secondary | ICD-10-CM

## 2020-08-02 NOTE — Assessment & Plan Note (Addendum)
Chronic.  Last A1c in April 2022 was 10.5%, at that time he was not taking his night time insulin consistently.  He is still not checking his blood sugars.  I recommended he check his blood sugars at least 3 times daily and report the readings to me in 2 weeks.  We will check A1c today; he has cut out soda and hopefully we will see an improvement in A1c, goal is less than 8%.  I discussed with him the importance of having good control of blood sugar levels and we do not know which insulin to adjust if he is not checking his blood sugar at home. Foot exam and eye exam are up to date.  He follows closely with podiatry for diabetic foot care.  Taking statin regularly.  Plan to follow up in 3 months or sooner pending blood work.

## 2020-08-02 NOTE — Assessment & Plan Note (Addendum)
Chronic.  Last A1c in April 2022 was 10.5%, at that time he was not taking his night time insulin consistently.  He is still not checking his blood sugars.  I recommended he check his blood sugars at least 3 times daily and report the readings to me in 2 weeks.  We will check A1c today; he has cut out soda and hopefully we will see an improvement in A1c.  Goal is less than 8%.  I discussed with him the importance of having good control of blood sugar levels and we do not know which insulin to adjust if he is not checking his blood sugar at home. Foot exam and eye exam are up to date.  He follows closely with podiatry for diabetic foot care.  Taking statin regularly.  Plan to follow up in 3 months or sooner pending blood work.  May consider referral to endocrinology.

## 2020-08-02 NOTE — Assessment & Plan Note (Signed)
Chronic. BP at goal of less than 140/90 today in clinic.  Will plan to continue current medications and collaboration with Cardiology.  Will check electrolytes with kidney function today. Follow up in 3 months.

## 2020-08-02 NOTE — Assessment & Plan Note (Addendum)
Chronic.  Will recheck kidney function today.  Discussed importance of controlling diabetes to prevent further kidney damage.  If kidney function remains elevated/if it has worsened, consider stopping Metformin and nephrology consultation.  Follow up in 3 months.

## 2020-08-02 NOTE — Assessment & Plan Note (Signed)
Chronic.  Will check lipids today although patient is not fasting.  Follows with Cardiology.  Will plan to continue atorvastatin 80 mg daily, zetia 10 mg daily for now.  Consider Repatha vs. Leqvio injection if LDL not below goal of less than 70.  Follow up in 3 months.

## 2020-08-03 LAB — COMPLETE METABOLIC PANEL WITH GFR
AG Ratio: 1.6 (calc) (ref 1.0–2.5)
ALT: 8 U/L — ABNORMAL LOW (ref 9–46)
AST: 14 U/L (ref 10–35)
Albumin: 4 g/dL (ref 3.6–5.1)
Alkaline phosphatase (APISO): 83 U/L (ref 35–144)
BUN/Creatinine Ratio: 13 (calc) (ref 6–22)
BUN: 17 mg/dL (ref 7–25)
CO2: 26 mmol/L (ref 20–32)
Calcium: 10 mg/dL (ref 8.6–10.3)
Chloride: 105 mmol/L (ref 98–110)
Creat: 1.29 mg/dL — ABNORMAL HIGH (ref 0.70–1.22)
Globulin: 2.5 g/dL (calc) (ref 1.9–3.7)
Glucose, Bld: 127 mg/dL — ABNORMAL HIGH (ref 65–99)
Potassium: 4.9 mmol/L (ref 3.5–5.3)
Sodium: 139 mmol/L (ref 135–146)
Total Bilirubin: 0.9 mg/dL (ref 0.2–1.2)
Total Protein: 6.5 g/dL (ref 6.1–8.1)
eGFR: 55 mL/min/{1.73_m2} — ABNORMAL LOW (ref >=60)

## 2020-08-03 LAB — HEMOGLOBIN A1C
Hgb A1c MFr Bld: 9 % of total Hgb — ABNORMAL HIGH (ref <5.7)
Mean Plasma Glucose: 212 mg/dL
eAG (mmol/L): 11.7 mmol/L

## 2020-08-03 LAB — LIPID PANEL
Cholesterol: 186 mg/dL (ref <200)
HDL: 48 mg/dL (ref >=40)
LDL Cholesterol (Calc): 121 mg/dL (calc) — ABNORMAL HIGH
Non-HDL Cholesterol (Calc): 138 mg/dL (calc) — ABNORMAL HIGH (ref <130)
Total CHOL/HDL Ratio: 3.9 (calc) (ref <5.0)
Triglycerides: 80 mg/dL (ref <150)

## 2020-08-05 ENCOUNTER — Encounter: Payer: Self-pay | Admitting: Podiatry

## 2020-08-05 ENCOUNTER — Ambulatory Visit: Payer: Medicare HMO | Admitting: Podiatry

## 2020-08-05 ENCOUNTER — Other Ambulatory Visit: Payer: Self-pay

## 2020-08-05 DIAGNOSIS — I739 Peripheral vascular disease, unspecified: Secondary | ICD-10-CM | POA: Diagnosis not present

## 2020-08-05 DIAGNOSIS — B351 Tinea unguium: Secondary | ICD-10-CM

## 2020-08-05 DIAGNOSIS — E1142 Type 2 diabetes mellitus with diabetic polyneuropathy: Secondary | ICD-10-CM

## 2020-08-05 DIAGNOSIS — L608 Other nail disorders: Secondary | ICD-10-CM

## 2020-08-05 DIAGNOSIS — M79674 Pain in right toe(s): Secondary | ICD-10-CM

## 2020-08-05 DIAGNOSIS — M79675 Pain in left toe(s): Secondary | ICD-10-CM | POA: Diagnosis not present

## 2020-08-05 NOTE — Progress Notes (Signed)
This patient returns to my office for at risk foot care.  This patient requires this care by a professional since this patient will be at risk due to having diabetes and PVD.  This patient is unable to cut nails himself since the patient cannot reach his nails.These nails are painful walking and wearing shoes.  This patient presents for at risk foot care today.  General Appearance  Alert, conversant and in no acute stress.  Vascular  Dorsalis pedis and posterior tibial  pulses are weakly palpable  bilaterally.  Capillary return is within normal limits  bilaterally. Cold feet  Bilaterally. Absent digital hair.  Swelling foot 1/4  Neurologic  Senn-Weinstein monofilament wire test within normal limits right..LOPS absent left foot. Muscle power within normal limits bilaterally.  Nails Thick disfigured discolored nails with subungual debris  from hallux to fifth toes bilaterally. No evidence of bacterial infection or drainage bilaterally.  Orthopedic  No limitations of motion  feet .  No crepitus or effusions noted.  No bony pathology or digital deformities noted.  Skin  normotropic skin with no porokeratosis noted bilaterally.  No signs of infections or ulcers noted.     Onychomycosis  Pain in right toes  Pain in left toes  Consent was obtained for treatment procedures.   Mechanical debridement of nails 1-5  bilaterally performed with a nail nipper.  Filed with dremel without incident.    Return office visit   3 months                   Told patient to return for periodic foot care and evaluation due to potential at risk complications.   Jaydah Stahle DPM  

## 2020-08-09 ENCOUNTER — Telehealth: Payer: Self-pay

## 2020-09-08 ENCOUNTER — Other Ambulatory Visit: Payer: Self-pay | Admitting: Nurse Practitioner

## 2020-09-08 DIAGNOSIS — I10 Essential (primary) hypertension: Secondary | ICD-10-CM

## 2020-09-30 ENCOUNTER — Telehealth: Payer: Self-pay

## 2020-09-30 ENCOUNTER — Telehealth: Payer: Self-pay | Admitting: Pharmacist

## 2020-09-30 DIAGNOSIS — R0602 Shortness of breath: Secondary | ICD-10-CM | POA: Diagnosis not present

## 2020-09-30 DIAGNOSIS — I2510 Atherosclerotic heart disease of native coronary artery without angina pectoris: Secondary | ICD-10-CM | POA: Diagnosis not present

## 2020-09-30 DIAGNOSIS — E785 Hyperlipidemia, unspecified: Secondary | ICD-10-CM | POA: Diagnosis not present

## 2020-09-30 DIAGNOSIS — I10 Essential (primary) hypertension: Secondary | ICD-10-CM | POA: Diagnosis not present

## 2020-09-30 NOTE — Progress Notes (Signed)
Chronic Care Management Pharmacy Assistant   Name: Dylan Robles  MRN: [id number] DOB: 1940/02/01  Reason for Encounter: Disease State For DM.    Conditions to be addressed/monitored: HTN, CAD, DM w/ neuropathy, Hypothyroidism, HLD  Recent office visits:  08/02/20 Dylan Bear, NP. For diabetes. No medication changes.   Recent consult visits:  08/05/20 Podiatry Dylan Robles, DPM. For nail problem. No medication changes.  06/29/20 Cardiology Dylan Robles. No information given.  Hospital visits:  None since 06/25/20.   Medications: Outpatient Encounter Medications as of 09/30/2020  Medication Sig   amLODipine (NORVASC) 5 MG tablet TAKE 1 TABLET EVERY DAY   aspirin EC 81 MG tablet Take 81 mg by mouth daily.    atorvastatin (LIPITOR) 80 MG tablet TAKE 1 TABLET AT BEDTIME.   Blood Glucose Calibration (TRUE METRIX LEVEL 1) Low SOLN 1 each by In Vitro route as directed.   Blood Glucose Monitoring Suppl (TRUE METRIX AIR GLUCOSE METER) w/Device KIT 1 each by Does not apply route as directed.   BRILINTA 60 MG TABS tablet Take 60 mg by mouth 2 (two) times daily.    diclofenac Sodium (VOLTAREN) 1 % GEL Apply 2 g topically 2 (two) times daily as needed.   DROPLET INSULIN SYRINGE 30G X 5/16" 1 ML MISC USE AS DIRECTED   ezetimibe (ZETIA) 10 MG tablet Take 10 mg by mouth daily.   furosemide (LASIX) 20 MG tablet Take 20 mg by mouth as needed.   gabapentin (NEURONTIN) 300 MG capsule Take 1 capsule (300 mg total) by mouth 3 (three) times daily.   glipiZIDE (GLUCOTROL) 5 MG tablet TAKE 1/2 TABLET DAILY BEFORE BREAKFAST   glucose blood test strip 1 each by Other route as needed. Use as instructed   insulin aspart (NOVOLOG) 100 UNIT/ML injection Inject 5 Units into the skin 3 (three) times daily before meals.    isosorbide mononitrate (IMDUR) 30 MG 24 hr tablet TAKE 1 TABLET EVERY DAY   ketoconazole (NIZORAL) 2 % cream Apply 1 application topically daily.   ketorolac (ACULAR) 0.5 %  ophthalmic solution    LEVEMIR FLEXTOUCH 100 UNIT/ML Pen Inject 34 Units into the skin at bedtime.    levothyroxine (SYNTHROID) 88 MCG tablet TAKE 1 TABLET DAILY BEFORE BREAKFAST.   lisinopril (ZESTRIL) 10 MG tablet TAKE 1 TABLET EVERY DAY   metFORMIN (GLUCOPHAGE) 1000 MG tablet TAKE 1 TABLET 2 TIMES DAILY WITH Robles MEAL   metoprolol succinate (TOPROL-XL) 50 MG 24 hr tablet Take 1 tablet (50 mg total) by mouth daily.   ofloxacin (OCUFLOX) 0.3 % ophthalmic solution    tamsulosin (FLOMAX) 0.4 MG CAPS capsule Take 1 capsule (0.4 mg total) by mouth at bedtime.   No facility-administered encounter medications on file as of 09/30/2020.   Recent Relevant Labs: Lab Results  Component Value Date/Time   HGBA1C 9.0 (H) 08/02/2020 10:36 AM   HGBA1C 10.5 (H) 04/29/2020 11:41 AM   MICROALBUR 1.4 09/22/2019 04:13 PM   MICROALBUR 0.67 12/17/2006 09:03 PM    Kidney Function Lab Results  Component Value Date/Time   CREATININE 1.29 (H) 08/02/2020 10:36 AM   CREATININE 1.23 (H) 04/29/2020 11:41 AM   GFRNONAA 54 (L) 04/29/2020 11:41 AM   GFRAA 63 04/29/2020 11:41 AM    Current antihyperglycemic regimen:  Metformin $RemoveBe'1000mg'gPmeSBhLr$  BID Levemir 34 units hs Novolog 6 units TID with meals Glipizide 2.$RemoveBeforeDEI'5mg'mYJiKPyWaSqvnpQo$  daily with breakfast  What recent interventions/DTPs have been made to improve glycemic control:  None.  Have there been  any recent hospitalizations or ED visits since last visit with CPP? Patient stated no.  Patient denies hypoglycemic symptoms, including None  Patient denies hyperglycemic symptoms, including none  How often are you checking your blood sugar?  Patients wife stated once daily  What are your blood sugars ranging?  Patients wife stated his readings 120-150  During the week, how often does your blood glucose drop below 70? Never  Are you checking your feet daily/regularly?  Patients wife was reminded to check his feet regularly.   Adherence Review: Is the patient currently on Robles STATIN  medication?  Atorvastatin 80 mg  Is the patient currently on ACE/ARB medication? Lisinopril 10 mg  Does the patient have >5 day gap between last estimated fill dates? Per misc rpts, no.  Care Gaps:Not on the list.   Star Rating Drugs:Metformin 1000 mg 07/28/20 90 DS, Lisinopril 10 mg 07/27/20 90 DS, Glipizide 5 mg 07/19/20 90 DS, Atorvastatin 80 mg 07/19/20 90 DS.  Follow-Up:Pharmacist Review  Charlann Lange, [place] Pharmacist Assistant [phone]

## 2020-10-22 DIAGNOSIS — G4733 Obstructive sleep apnea (adult) (pediatric): Secondary | ICD-10-CM | POA: Diagnosis not present

## 2020-11-03 ENCOUNTER — Encounter: Payer: Self-pay | Admitting: Nurse Practitioner

## 2020-11-03 ENCOUNTER — Ambulatory Visit (INDEPENDENT_AMBULATORY_CARE_PROVIDER_SITE_OTHER): Payer: PRIVATE HEALTH INSURANCE | Admitting: Nurse Practitioner

## 2020-11-03 ENCOUNTER — Other Ambulatory Visit: Payer: Self-pay

## 2020-11-03 VITALS — BP 128/72 | HR 73 | Temp 98.4°F | Resp 18 | Ht 78.0 in | Wt 220.0 lb

## 2020-11-03 DIAGNOSIS — E039 Hypothyroidism, unspecified: Secondary | ICD-10-CM

## 2020-11-03 DIAGNOSIS — E1159 Type 2 diabetes mellitus with other circulatory complications: Secondary | ICD-10-CM

## 2020-11-03 DIAGNOSIS — Z23 Encounter for immunization: Secondary | ICD-10-CM

## 2020-11-03 DIAGNOSIS — E1169 Type 2 diabetes mellitus with other specified complication: Secondary | ICD-10-CM

## 2020-11-03 DIAGNOSIS — E782 Mixed hyperlipidemia: Secondary | ICD-10-CM

## 2020-11-03 DIAGNOSIS — E785 Hyperlipidemia, unspecified: Secondary | ICD-10-CM | POA: Diagnosis not present

## 2020-11-03 DIAGNOSIS — N1831 Chronic kidney disease, stage 3a: Secondary | ICD-10-CM | POA: Diagnosis not present

## 2020-11-03 DIAGNOSIS — I10 Essential (primary) hypertension: Secondary | ICD-10-CM | POA: Diagnosis not present

## 2020-11-03 DIAGNOSIS — E1142 Type 2 diabetes mellitus with diabetic polyneuropathy: Secondary | ICD-10-CM

## 2020-11-03 NOTE — Progress Notes (Signed)
Subjective:    Patient ID: Dylan Robles, male    DOB: 06-16-1940, 80 y.o.   MRN: [id number]  HPI: Dylan Robles is a 80 y.o. male presenting for follow up.  Chief Complaint  Patient presents with   Diabetes   Hypertension   HYPERTENSION / HYPERLIPIDEMIA Currently taking amlodipine 5 mg, furosemide 20 mg daily as needed for swelling, lisinopril 10 mg daily, and metoprolol succinate 50 mg daily for blood pressure.  He is taking atorvastatin 80 mg daily, Brilinta 60 mg daily, Zetia 10 mg daily for high cholesterol and coronary artery disease. BP monitoring frequency: not checking BP range:  Aspirin: yes Recent stressors: no Recurrent headaches: no Visual changes: no Palpitations: no Dyspnea: no Chest pain: no Lower extremity edema: no Dizzy/lightheaded: no Myalgias: no The ASCVD Risk score (Arnett DK, et al., 2019) failed to calculate for the following reasons:   The 2019 ASCVD risk score is only valid for ages 2 to 38  DIABETES He tells me today he has been trying to watch what he is eating.  However, he is still drinking soda at least once daily.  He does not drink sweet tea.  He tells me he is taking 28 units of Levemir at nighttime.  He takes mealtime insulin 6 units 3 times daily with meals.  He forgets his nighttime insulin a few times per week.[name]  He is not checking his blood sugars.  Previously, he had a continuous blood sugar monitor, but ran out of the testing material.  He has been referred to our chronic care management team but it is difficult to get in touch with him. Hypoglycemic episodes:no Polydipsia/polyuria: no Visual disturbance: no Chest pain: no Paresthesias: no Glucose Monitoring: no Taking Insulin?: yes  Long acting insulin: 28 units  Short acting insulin: 6 units with meals Blood Pressure Monitoring: not checking Retinal Examination: Up to Date Foot Exam: Up to Date Diabetic Education: Completed Pneumovax: Up-to-date Influenza:  Given  today Aspirin: yes  CHRONIC KIDNEY DISEASE CKD status: stable Medications renally dose: yes Previous renal evaluation: no Pneumovax:  Up to Date Influenza Vaccine:  Up to Date  HYPOTHYROIDISM Currently taking levothyroxine 88 mcg daily.  He is tolerating this medication well. Thyroid control status:stable Satisfied with current treatment? yes Medication side effects: no Medication compliance: excellent compliance Recent dose adjustment:no Fatigue: no Cold intolerance: no Heat intolerance: no Weight gain: no Weight loss: no Constipation: no Diarrhea/loose stools: no Palpitations: no Lower extremity edema: no Anxiety/depressed mood: no  Patient also reports he had a sleep study set up through his cardiologist, Dr. Humphrey Rolls.  They told him he needs a CPAP.  He is waiting for [facility] to set up the CPAP machine.  We have requested records from Cardiology in the past.  Allergies  Allergen Reactions   Plavix [Clopidogrel] Hives    Outpatient Encounter Medications as of 11/03/2020  Medication Sig   amLODipine (NORVASC) 5 MG tablet TAKE 1 TABLET EVERY DAY   aspirin EC 81 MG tablet Take 81 mg by mouth daily.    atorvastatin (LIPITOR) 80 MG tablet TAKE 1 TABLET AT BEDTIME.   Blood Glucose Calibration (TRUE METRIX LEVEL 1) Low SOLN 1 each by In Vitro route as directed.   Blood Glucose Monitoring Suppl (TRUE METRIX AIR GLUCOSE METER) w/Device KIT 1 each by Does not apply route as directed.   BRILINTA 60 MG TABS tablet Take 60 mg by mouth 2 (two) times daily.    diclofenac Sodium (VOLTAREN)  1 % GEL Apply 2 g topically 2 (two) times daily as needed.   DROPLET INSULIN SYRINGE 30G X 5/16" 1 ML MISC USE AS DIRECTED   ezetimibe (ZETIA) 10 MG tablet Take 10 mg by mouth daily.   furosemide (LASIX) 20 MG tablet Take 20 mg by mouth as needed.   gabapentin (NEURONTIN) 300 MG capsule Take 1 capsule (300 mg total) by mouth 3 (three) times daily.   glipiZIDE (GLUCOTROL) 5 MG tablet TAKE 1/2 TABLET  DAILY BEFORE BREAKFAST   glucose blood test strip 1 each by Other route as needed. Use as instructed   insulin aspart (NOVOLOG) 100 UNIT/ML injection Inject 5 Units into the skin 3 (three) times daily before meals.    isosorbide mononitrate (IMDUR) 30 MG 24 hr tablet TAKE 1 TABLET EVERY DAY   ketoconazole (NIZORAL) 2 % cream Apply 1 application topically daily.   ketorolac (ACULAR) 0.5 % ophthalmic solution    LEVEMIR FLEXTOUCH 100 UNIT/ML Pen Inject 34 Units into the skin at bedtime.    levothyroxine (SYNTHROID) 88 MCG tablet TAKE 1 TABLET DAILY BEFORE BREAKFAST.   lisinopril (ZESTRIL) 10 MG tablet TAKE 1 TABLET EVERY DAY   metFORMIN (GLUCOPHAGE) 1000 MG tablet TAKE 1 TABLET 2 TIMES DAILY WITH A MEAL   metoprolol succinate (TOPROL-XL) 50 MG 24 hr tablet Take 1 tablet (50 mg total) by mouth daily.   ofloxacin (OCUFLOX) 0.3 % ophthalmic solution    tamsulosin (FLOMAX) 0.4 MG CAPS capsule Take 1 capsule (0.4 mg total) by mouth at bedtime.   [DISCONTINUED] isosorbide mononitrate (IMDUR) 30 MG 24 hr tablet Take 1 tablet (30 mg total) by mouth daily.   No facility-administered encounter medications on file as of 11/03/2020.    Patient Active Problem List   Diagnosis Date Noted   Stage 3a chronic kidney disease ([place]) 03/03/2020   Atherosclerosis of native arteries of extremity with intermittent claudication ([place]) 12/19/2018   Hyperlipidemia 12/19/2018   Diabetic neuropathy ([place]) 09/26/2018   Type 2 diabetes mellitus with vascular disease ([place]) 09/26/2018   Coronary artery disease of native artery of native heart with stable angina pectoris ([place]) 09/20/2017   Type 2 diabetes mellitus with hyperlipidemia ([place]) 09/16/2008   Hypothyroidism 03/29/2006   Essential hypertension 01/13/2006   PERIPHERAL VASCULAR DISEASE 01/13/2006    Past Medical History:  Diagnosis Date   Acute lower UTI 09/20/2017   Bradycardia on ECG 07/31/2011   Coronary artery disease    hyperlipidemia   Decreased GFR  09/23/2019   Dehydration 09/21/2017   Diabetes mellitus    Diabetes mellitus without complication ([place])    Hypercholesterolemia    Hypertension    Hypothyroidism    Left arm pain 01/29/2020   Paronychia of great toe of left foot 09/26/2018   Paronychia of great toe, left 09/26/2018   PERIPHERAL NEUROPATHY 01/13/2006   Qualifier: Diagnosis of  By: Truett Mainland MD, Christine     Shortness of breath dyspnea    with exertion   Vasovagal syncope    Weakness 09/20/2017    Relevant past medical, surgical, family and social history reviewed and updated as indicated. Interim medical history since our last visit reviewed.  Review of Systems Per HPI unless specifically indicated above     Objective:    BP 128/72 (BP Location: Left Arm, Patient Position: Sitting)   Pulse 73   Temp 98.4 F (36.9 C) (Oral)   Resp 18   Ht 6' 6" (1.981 m)   Wt 220 lb (99.8 kg)  SpO2 98%   BMI 25.42 kg/m   Wt Readings from Last 3 Encounters:  11/03/20 220 lb (99.8 kg)  08/02/20 221 lb 9.6 oz (100.5 kg)  06/09/20 218 lb 3.2 oz (99 kg)    Physical Exam Vitals and nursing note reviewed.  Constitutional:      General: He is not in acute distress.    Appearance: Normal appearance. He is normal weight. He is not toxic-appearing.  HENT:     Head: Normocephalic and atraumatic.  Eyes:     General: No scleral icterus.    Extraocular Movements: Extraocular movements intact.  Neck:     Vascular: No carotid bruit.  Cardiovascular:     Rate and Rhythm: Normal rate and regular rhythm.     Heart sounds: Normal heart sounds. No murmur heard. Pulmonary:     Effort: Pulmonary effort is normal. No respiratory distress.     Breath sounds: Normal breath sounds. No wheezing, rhonchi or rales.  Musculoskeletal:     Cervical back: Normal range of motion.     Right lower leg: No edema.     Left lower leg: No edema.  Skin:    General: Skin is warm and dry.     Capillary Refill: Capillary refill takes less than 2 seconds.      Coloration: Skin is not jaundiced or pale.     Findings: No erythema.  Neurological:     General: No focal deficit present.     Mental Status: He is alert and oriented to person, place, and time.     Motor: No weakness.     Gait: Gait (Walks with cane) normal.  Psychiatric:        Mood and Affect: Mood normal.        Behavior: Behavior normal.        Thought Content: Thought content normal.        Judgment: Judgment normal.      Assessment & Plan:   Problem List Items Addressed This Visit       Cardiovascular and Mediastinum   Type 2 diabetes mellitus with vascular disease ([place])    Chronic.  Last A1c was improved, however remains above 9%.  He is still not taking his insulin consistently.  He still drinking sugary drinks.  He is still not checking his blood sugars.  We had a long discussion today.  We discussed the importance of keeping his blood sugar level less than 200 on average to prevent further kidney damage, eyesight damage, vascular damage, etc.  I have asked him to check his blood sugars at home and if greater than 200, slowly increase on Levemir by 2 units every 3 days.  Once blood sugar fasting is less than 200, he can continue Levemir consistent dosing.  Goal A1c is less than 8%, which would be an average blood sugar of less than 200.  He is going to check into getting refills of the materials for the continuous glucose monitor that way he does not have to poke his finger.  Foot exam and eye exam are up-to-date.  He is on high intensity statin.  He follows closely with podiatry.  Check A1c today.  Follow-up in 3 months.      Relevant Orders   Hemoglobin A1c (Completed)   COMPLETE METABOLIC PANEL WITH GFR (Completed)   CBC with Differential/Platelet (Completed)   Essential hypertension    Chronic.  Blood pressure is at goal of less than 140/90 today in clinic.  We  will plan to continue current medications and continue collaboration with cardiology.  We will request records.   Check electrolytes with kidney function today.  Follow-up 3 months.      Relevant Orders   Lipid panel (Completed)   COMPLETE METABOLIC PANEL WITH GFR (Completed)     Endocrine   Type 2 diabetes mellitus with hyperlipidemia ([facility]) - Primary    Chronic.  Last A1c was improved, however remains above 9%.  He is still not taking his insulin consistently.  He still drinking sugary drinks.  He is still not checking his blood sugars.  We had a long discussion today.  We discussed the importance of keeping his blood sugar level less than 200 on average to prevent further kidney damage, eyesight damage, vascular damage, etc.  I have asked him to check his blood sugars at home and if greater than 200, slowly increase on Levemir by 2 units every 3 days.  Once blood sugar fasting is less than 200, he can continue Levemir consistent dosing.  Goal A1c is less than 8%, which would be an average blood sugar of less than 200.  He is going to check into getting refills of the materials for the continuous glucose monitor that way he does not have to poke his finger.  Foot exam and eye exam are up-to-date.  He is on high intensity statin.  He follows closely with podiatry.  Check A1c today.  Follow-up in 3 months.      Relevant Orders   Hemoglobin A1c (Completed)   COMPLETE METABOLIC PANEL WITH GFR (Completed)   CBC with Differential/Platelet (Completed)   Hypothyroidism    Chronic.  We will check TSH today.  As long as stable, plan to continue levothyroxine at 88 mcg daily.  Follow-up in 6 months.      Relevant Orders   TSH (Completed)   Diabetic neuropathy ([facility])    Chronic.  Patient reports improvement in neuropathy with increase of gabapentin to 300 mg 3 times daily.  He follows with podiatry for diabetic foot care-continue collaboration.  Follow-up 3 months.      Relevant Orders   Hemoglobin A1c (Completed)   COMPLETE METABOLIC PANEL WITH GFR (Completed)   CBC with Differential/Platelet (Completed)      Genitourinary   Stage 3a chronic kidney disease ([facility])    Chronic.  We will recheck kidney function today.  Discussed importance of controlling blood sugars to prevent further kidney damage.  If creatinine greater than 1.5, will plan to stop metformin.  Can also consider nephrology consultation if he progresses to stage IIIb.  Follow-up 3 months.      Relevant Orders   COMPLETE METABOLIC PANEL WITH GFR (Completed)   CBC with Differential/Platelet (Completed)     Other   Hyperlipidemia    Chronic.  Check lipids today-patient is fasting.  LDL goal is less than 70.  Cardiology manages his cholesterol, will request records today.  Follow-up 3 months.      Relevant Orders   Lipid panel (Completed)   Other Visit Diagnoses     Need for influenza vaccination       Relevant Orders   Flu Vaccine QUAD High Dose(Fluad) (Completed)        Follow up plan: Return in about 3 months (around 02/03/2021).

## 2020-11-04 ENCOUNTER — Ambulatory Visit: Payer: PRIVATE HEALTH INSURANCE | Admitting: Podiatry

## 2020-11-04 ENCOUNTER — Encounter: Payer: Self-pay | Admitting: Podiatry

## 2020-11-04 DIAGNOSIS — B351 Tinea unguium: Secondary | ICD-10-CM | POA: Diagnosis not present

## 2020-11-04 DIAGNOSIS — M79674 Pain in right toe(s): Secondary | ICD-10-CM | POA: Diagnosis not present

## 2020-11-04 DIAGNOSIS — I739 Peripheral vascular disease, unspecified: Secondary | ICD-10-CM

## 2020-11-04 DIAGNOSIS — L608 Other nail disorders: Secondary | ICD-10-CM | POA: Diagnosis not present

## 2020-11-04 DIAGNOSIS — M79675 Pain in left toe(s): Secondary | ICD-10-CM

## 2020-11-04 DIAGNOSIS — E1142 Type 2 diabetes mellitus with diabetic polyneuropathy: Secondary | ICD-10-CM | POA: Diagnosis not present

## 2020-11-04 LAB — CBC WITH DIFFERENTIAL/PLATELET
Absolute Monocytes: 583 cells/uL (ref 200–950)
Basophils Absolute: 43 cells/uL (ref 0–200)
Basophils Relative: 0.6 %
Eosinophils Absolute: 151 cells/uL (ref 15–500)
Eosinophils Relative: 2.1 %
HCT: 37.7 % — ABNORMAL LOW (ref 38.5–50.0)
Hemoglobin: 12.2 g/dL — ABNORMAL LOW (ref 13.2–17.1)
Lymphs Abs: 1433 cells/uL (ref 850–3900)
MCH: 29.5 pg (ref 27.0–33.0)
MCHC: 32.4 g/dL (ref 32.0–36.0)
MCV: 91.1 fL (ref 80.0–100.0)
MPV: 10.1 fL (ref 7.5–12.5)
Monocytes Relative: 8.1 %
Neutro Abs: 4990 cells/uL (ref 1500–7800)
Neutrophils Relative %: 69.3 %
Platelets: 264 10*3/uL (ref 140–400)
RBC: 4.14 10*6/uL — ABNORMAL LOW (ref 4.20–5.80)
RDW: 13 % (ref 11.0–15.0)
Total Lymphocyte: 19.9 %
WBC: 7.2 10*3/uL (ref 3.8–10.8)

## 2020-11-04 LAB — LIPID PANEL
Cholesterol: 129 mg/dL (ref <200)
HDL: 40 mg/dL (ref >=40)
LDL Cholesterol (Calc): 73 mg/dL (calc)
Non-HDL Cholesterol (Calc): 89 mg/dL (calc) (ref <130)
Total CHOL/HDL Ratio: 3.2 (calc) (ref <5.0)
Triglycerides: 82 mg/dL (ref <150)

## 2020-11-04 LAB — COMPLETE METABOLIC PANEL WITH GFR
AG Ratio: 1.6 (calc) (ref 1.0–2.5)
ALT: 9 U/L (ref 9–46)
AST: 13 U/L (ref 10–35)
Albumin: 4 g/dL (ref 3.6–5.1)
Alkaline phosphatase (APISO): 99 U/L (ref 35–144)
BUN/Creatinine Ratio: 15 (calc) (ref 6–22)
BUN: 19 mg/dL (ref 7–25)
CO2: 22 mmol/L (ref 20–32)
Calcium: 10 mg/dL (ref 8.6–10.3)
Chloride: 106 mmol/L (ref 98–110)
Creat: 1.28 mg/dL — ABNORMAL HIGH (ref 0.70–1.22)
Globulin: 2.5 g/dL (calc) (ref 1.9–3.7)
Glucose, Bld: 111 mg/dL — ABNORMAL HIGH (ref 65–99)
Potassium: 4.4 mmol/L (ref 3.5–5.3)
Sodium: 142 mmol/L (ref 135–146)
Total Bilirubin: 1 mg/dL (ref 0.2–1.2)
Total Protein: 6.5 g/dL (ref 6.1–8.1)
eGFR: 56 mL/min/{1.73_m2} — ABNORMAL LOW (ref >=60)

## 2020-11-04 LAB — TSH: TSH: 1.45 mIU/L (ref 0.40–4.50)

## 2020-11-04 LAB — HEMOGLOBIN A1C
Hgb A1c MFr Bld: 9.8 % of total Hgb — ABNORMAL HIGH (ref <5.7)
Mean Plasma Glucose: 235 mg/dL
eAG (mmol/L): 13 mmol/L

## 2020-11-04 NOTE — Progress Notes (Signed)
This patient returns to my office for at risk foot care.  This patient requires this care by a professional since this patient will be at risk due to having diabetes and PVD.  This patient is unable to cut nails himself since the patient cannot reach his nails.These nails are painful walking and wearing shoes.  This patient presents for at risk foot care today.  General Appearance  Alert, conversant and in no acute stress.  Vascular  Dorsalis pedis and posterior tibial  pulses are weakly palpable  bilaterally.  Capillary return is within normal limits  bilaterally. Cold feet  Bilaterally. Absent digital hair.  Swelling foot 1/4  Neurologic  Senn-Weinstein monofilament wire test within normal limits right..LOPS absent left foot. Muscle power within normal limits bilaterally.  Nails Thick disfigured discolored nails with subungual debris  from hallux to fifth toes bilaterally. No evidence of bacterial infection or drainage bilaterally.  Orthopedic  No limitations of motion  feet .  No crepitus or effusions noted.  No bony pathology or digital deformities noted.  Skin  normotropic skin with no porokeratosis noted bilaterally.  No signs of infections or ulcers noted.     Onychomycosis  Pain in right toes  Pain in left toes  Consent was obtained for treatment procedures.   Mechanical debridement of nails 1-5  bilaterally performed with a nail nipper.  Filed with dremel without incident.    Return office visit   3 months                   Told patient to return for periodic foot care and evaluation due to potential at risk complications.   Zoran Yankee DPM  

## 2020-11-05 ENCOUNTER — Telehealth: Payer: Self-pay

## 2020-11-05 NOTE — Telephone Encounter (Signed)
Spoke with patient regarding results and recommendations. Patient voiced understanding. No further questions or concerns.

## 2020-11-05 NOTE — Telephone Encounter (Signed)
Attempted to reach pt re: lab results and recommendations. LVM for pt to return call.

## 2020-11-05 NOTE — Assessment & Plan Note (Signed)
Chronic.  Last A1c was improved, however remains above 9%.  He is still not taking his insulin consistently.  He still drinking sugary drinks.  He is still not checking his blood sugars.  We had a long discussion today.  We discussed the importance of keeping his blood sugar level less than 200 on average to prevent further kidney damage, eyesight damage, vascular damage, etc.  I have asked him to check his blood sugars at home and if greater than 200, slowly increase on Levemir by 2 units every 3 days.  Once blood sugar fasting is less than 200, he can continue Levemir consistent dosing.  Goal A1c is less than 8%, which would be an average blood sugar of less than 200.  He is going to check into getting refills of the materials for the continuous glucose monitor that way he does not have to poke his finger.  Foot exam and eye exam are up-to-date.  He is on high intensity statin.  He follows closely with podiatry.  Check A1c today.  Follow-up in 3 months.

## 2020-11-05 NOTE — Assessment & Plan Note (Signed)
Chronic.  We will recheck kidney function today.  Discussed importance of controlling blood sugars to prevent further kidney damage.  If creatinine greater than 1.5, will plan to stop metformin.  Can also consider nephrology consultation if he progresses to stage IIIb.  Follow-up 3 months.

## 2020-11-05 NOTE — Telephone Encounter (Signed)
-----   Message from [name], NP sent at 11/05/2020  9:22 AM EDT ----- Please notify with lab results. - TSH is normal, we will continue present dose of levothyroxine. - HGbA1c has worsened to 9.8%.  Please stress importance of taking night time insulin every day.  He should be taking mealtime insulin 5 units with meals, night time insulin 34 units at bedtime.  Our goal A1c is less than 8%, would like to see average blood sugars less than 200.  Please have him report his blood sugar readings to me in 2 weeks.  - Cholesterol levels have improved and LDL is now near goal, which is great. - Electrolytes, kidney function, liver enzymes are stable.  - Still slightly anemic, however not severe.  This is likely secondary to uncontrolled diabetes.  Follow up as scheduled.

## 2020-11-05 NOTE — Assessment & Plan Note (Signed)
Chronic.  Blood pressure is at goal of less than 140/90 today in clinic.  We will plan to continue current medications and continue collaboration with cardiology.  We will request records.  Check electrolytes with kidney function today.  Follow-up 3 months.

## 2020-11-05 NOTE — Assessment & Plan Note (Signed)
Chronic.  Patient reports improvement in neuropathy with increase of gabapentin to 300 mg 3 times daily.  He follows with podiatry for diabetic foot care-continue collaboration.  Follow-up 3 months.

## 2020-11-05 NOTE — Assessment & Plan Note (Signed)
Chronic.  We will check TSH today.  As long as stable, plan to continue levothyroxine at 88 mcg daily.  Follow-up in 6 months.

## 2020-11-05 NOTE — Assessment & Plan Note (Signed)
Chronic.  Check lipids today-patient is fasting.  LDL goal is less than 70.  Cardiology manages his cholesterol, will request records today.  Follow-up 3 months.

## 2020-11-11 ENCOUNTER — Telehealth: Payer: Self-pay | Admitting: Pharmacist

## 2020-11-11 NOTE — Progress Notes (Signed)
Chronic Care Management Pharmacy Assistant   Name: Dylan Robles  MRN: [id number] DOB: 1940-11-10  Reason for Encounter: Disease State For HTN.    Conditions to be addressed/monitored: HTN, CAD, DM w/ neuropathy, Hypothyroidism, HLD  Recent office visits:  11/03/20 Dylan Nose, NP. For Type 2 DM. No medication changes.  Recent consult visits:  11/04/20 Podiatry Dylan Robles, DPM. For nail problem. No medication changes.   Hospital visits:  None since 09/30/20  Medications: Outpatient Encounter Medications as of 11/11/2020  Medication Sig   amLODipine (NORVASC) 5 MG tablet TAKE 1 TABLET EVERY DAY   aspirin EC 81 MG tablet Take 81 mg by mouth daily.    atorvastatin (LIPITOR) 80 MG tablet TAKE 1 TABLET AT BEDTIME.   Blood Glucose Calibration (TRUE METRIX LEVEL 1) Low SOLN 1 each by In Vitro route as directed.   Blood Glucose Monitoring Suppl (TRUE METRIX AIR GLUCOSE METER) w/Device KIT 1 each by Does not apply route as directed.   BRILINTA 60 MG TABS tablet Take 60 mg by mouth 2 (two) times daily.    diclofenac Sodium (VOLTAREN) 1 % GEL Apply 2 g topically 2 (two) times daily as needed.   DROPLET INSULIN SYRINGE 30G X 5/16" 1 ML MISC USE AS DIRECTED   ezetimibe (ZETIA) 10 MG tablet Take 10 mg by mouth daily.   furosemide (LASIX) 20 MG tablet Take 20 mg by mouth as needed.   gabapentin (NEURONTIN) 300 MG capsule Take 1 capsule (300 mg total) by mouth 3 (three) times daily.   glipiZIDE (GLUCOTROL) 5 MG tablet TAKE 1/2 TABLET DAILY BEFORE BREAKFAST   glucose blood test strip 1 each by Other route as needed. Use as instructed   insulin aspart (NOVOLOG) 100 UNIT/ML injection Inject 5 Units into the skin 3 (three) times daily before meals.    isosorbide mononitrate (IMDUR) 30 MG 24 hr tablet TAKE 1 TABLET EVERY DAY   ketoconazole (NIZORAL) 2 % cream Apply 1 application topically daily.   ketorolac (ACULAR) 0.5 % ophthalmic solution    LEVEMIR FLEXTOUCH 100 UNIT/ML Pen Inject  34 Units into the skin at bedtime.    levothyroxine (SYNTHROID) 88 MCG tablet TAKE 1 TABLET DAILY BEFORE BREAKFAST.   lisinopril (ZESTRIL) 10 MG tablet TAKE 1 TABLET EVERY DAY   metFORMIN (GLUCOPHAGE) 1000 MG tablet TAKE 1 TABLET 2 TIMES DAILY WITH A MEAL   metoprolol succinate (TOPROL-XL) 50 MG 24 hr tablet Take 1 tablet (50 mg total) by mouth daily.   ofloxacin (OCUFLOX) 0.3 % ophthalmic solution    tamsulosin (FLOMAX) 0.4 MG CAPS capsule Take 1 capsule (0.4 mg total) by mouth at bedtime.   No facility-administered encounter medications on file as of 11/11/2020.   Reviewed chart prior to disease state call. Spoke with patient regarding BP  Recent Office Vitals: BP Readings from Last 3 Encounters:  11/03/20 128/72  08/02/20 132/82  06/09/20 108/72   Pulse Readings from Last 3 Encounters:  11/03/20 73  08/02/20 65  06/09/20 84    Wt Readings from Last 3 Encounters:  11/03/20 220 lb (99.8 kg)  08/02/20 221 lb 9.6 oz (100.5 kg)  06/09/20 218 lb 3.2 oz (99 kg)     Kidney Function Lab Results  Component Value Date/Time   CREATININE 1.28 (H) 11/03/2020 10:32 AM   CREATININE 1.29 (H) 08/02/2020 10:36 AM   GFRNONAA 54 (L) 04/29/2020 11:41 AM   GFRAA 63 04/29/2020 11:41 AM    BMP Latest Ref Rng & Units  11/03/2020 08/02/2020 04/29/2020  Glucose 65 - 99 mg/dL 111(H) 127(H) 277(H)  BUN 7 - 25 mg/dL $Remove'19 17 16  'vSNZFAf$ Creatinine 0.70 - 1.22 mg/dL 1.28(H) 1.29(H) 1.23(H)  BUN/Creat Ratio 6 - 22 (calc) $RemoveB'15 13 13  'bvjHlWAN$ Sodium 135 - 146 mmol/L 142 139 137  Potassium 3.5 - 5.3 mmol/L 4.4 4.9 4.9  Chloride 98 - 110 mmol/L 106 105 103  CO2 20 - 32 mmol/L $RemoveB'22 26 23  'uuzdOHrC$ Calcium 8.6 - 10.3 mg/dL 10.0 10.0 10.0    Current antihypertensive regimen:  Amlodipine $RemoveBef'5mg'FDVrkEvSjP$  daily Metoprolol XL $RemoveBefor'50mg'eohEpKzeFhvF$  daily Imdur $RemoveBef'30mg'HRWCyOjNDU$  24hr daily Lisinopril $RemoveBeforeDE'10mg'ZCKRESIVmfUbuFR$  daily  How often are you checking your Blood Pressure?  Patients wife stated she doesn't check his blood pressure regularly but she will start. She state she would call me back  with some home readings when she gets home.  Current home BP readings:  Patients wife stated she doesn't check his blood pressure regularly but she will start. She state she would call me back with some home readings when she gets home.  What recent interventions/DTPs have been made by any provider to improve Blood Pressure control since last CPP Visit: None.  Any recent hospitalizations or ED visits since last visit with CPP? Patients wife stated no.   What diet changes have been made to improve Blood Pressure Control?  Patient wife stated he has a areally good appetite.   What exercise is being done to improve your Blood Pressure Control?  Patients wife stated he is active but doesn't do any regular exercising.   Adherence Review: Is the patient currently on ACE/ARB medication? Lisinopril 10 mg, Atorvastatin 80 mg  Does the patient have >5 day gap between last estimated fill dates? Per misc rpts, no.   Care Gaps:Not on the list.  Star Rating Drugs:Glipizide 5 mg 07/19/20 90 DS, Lisinopril 10 mg 90 DS 08/13/19 90 DS, Metformin 1000 mg 90 DS 07/28/20, Atorvastatin 80 mg 90 DS 07/19/20.  Follow-UP:Pharmacist Review  Dylan Robles, [place] Clinical Pharmacist Assistant [phone]

## 2020-12-04 ENCOUNTER — Other Ambulatory Visit: Payer: Self-pay | Admitting: Nurse Practitioner

## 2020-12-04 DIAGNOSIS — Z87438 Personal history of other diseases of male genital organs: Secondary | ICD-10-CM

## 2020-12-14 ENCOUNTER — Telehealth: Payer: Self-pay | Admitting: Pharmacist

## 2020-12-14 NOTE — Progress Notes (Addendum)
Chronic Care Management Pharmacy Assistant   Name: Dylan Robles  MRN: [id number] DOB: 1940-03-25   Reason for Encounter: Disease State - Diabetes Call     Recent office visits:  None noted.   Recent consult visits:  None noted.  Hospital visits:  None in previous 6 months  Medications: Outpatient Encounter Medications as of 12/14/2020  Medication Sig   amLODipine (NORVASC) 5 MG tablet TAKE 1 TABLET EVERY DAY   aspirin EC 81 MG tablet Take 81 mg by mouth daily.    atorvastatin (LIPITOR) 80 MG tablet TAKE 1 TABLET AT BEDTIME.   Blood Glucose Calibration (TRUE METRIX LEVEL 1) Low SOLN 1 each by In Vitro route as directed.   Blood Glucose Monitoring Suppl (TRUE METRIX AIR GLUCOSE METER) w/Device KIT 1 each by Does not apply route as directed.   BRILINTA 60 MG TABS tablet Take 60 mg by mouth 2 (two) times daily.    diclofenac Sodium (VOLTAREN) 1 % GEL Apply 2 g topically 2 (two) times daily as needed.   DROPLET INSULIN SYRINGE 30G X 5/16" 1 ML MISC USE AS DIRECTED   ezetimibe (ZETIA) 10 MG tablet Take 10 mg by mouth daily.   furosemide (LASIX) 20 MG tablet Take 20 mg by mouth as needed.   gabapentin (NEURONTIN) 300 MG capsule Take 1 capsule (300 mg total) by mouth 3 (three) times daily.   glipiZIDE (GLUCOTROL) 5 MG tablet TAKE 1/2 TABLET DAILY BEFORE BREAKFAST   glucose blood test strip 1 each by Other route as needed. Use as instructed   insulin aspart (NOVOLOG) 100 UNIT/ML injection Inject 5 Units into the skin 3 (three) times daily before meals.    isosorbide mononitrate (IMDUR) 30 MG 24 hr tablet TAKE 1 TABLET EVERY DAY   ketoconazole (NIZORAL) 2 % cream Apply 1 application topically daily.   ketorolac (ACULAR) 0.5 % ophthalmic solution    LEVEMIR FLEXTOUCH 100 UNIT/ML Pen Inject 34 Units into the skin at bedtime.    levothyroxine (SYNTHROID) 88 MCG tablet TAKE 1 TABLET DAILY BEFORE BREAKFAST.   lisinopril (ZESTRIL) 10 MG tablet TAKE 1 TABLET EVERY DAY   metFORMIN  (GLUCOPHAGE) 1000 MG tablet TAKE 1 TABLET 2 TIMES DAILY WITH A MEAL   metoprolol succinate (TOPROL-XL) 50 MG 24 hr tablet Take 1 tablet (50 mg total) by mouth daily.   ofloxacin (OCUFLOX) 0.3 % ophthalmic solution    tamsulosin (FLOMAX) 0.4 MG CAPS capsule TAKE 1 CAPSULE AT BEDTIME   No facility-administered encounter medications on file as of 12/14/2020.    Current antihyperglycemic regimen:  Metformin 1048m BID Levemir 34 units hs Novolog 6 units TID with meals Glipizide 2.537mdaily with breakfast   What recent interventions/DTPs have been made to improve glycemic control:  Patient denied any recent changes in his current regimen.   Have there been any recent hospitalizations or ED visits since last visit with CPP?  Patient has not had any ED visits or hospitalizations since last visit with CPP.   Patient denies hypoglycemic symptoms, including Pale, Sweaty, Shaky, Hungry, Nervous/irritable, and Vision changes   Patient denies hyperglycemic symptoms, including blurry vision, excessive thirst, fatigue, polyuria, and weakness   How often are you checking your blood sugar? Patient reported he has been checking his blood sugars three time a day    What are your blood sugars ranging?  Fasting: 120-180 Before meals:  After meals:  Bedtime: 200-220  During the week, how often does your blood glucose drop below 70?  Patient denied any readings below 70.  Are you checking your feet daily/regularly? Patient reported he does check his feet daily.     Adherence Review: Is the patient currently on a STATIN medication? Yes Is the patient currently on ACE/ARB medication? Yes Does the patient have >5 day gap between last estimated fill dates? No  Metformin 1035m BID - last filled 10/21/20 90 days  Levemir 34 units hs - last filled 1 month ago per patient  Novolog 6 units TID with meals - last filled 1 month ago per patient  Glipizide 2.570mdaily with breakfast - last filled  10/01/20 90 days    Care Gaps  AWV: overdue Colonoscopy: unknown (? Aged out) DM Eye Exam: due 04/13/21 DM Foot Exam: due 11/04/21 Microalbumin: done 09/22/19 HbgAIC: done 11/03/20 (9.8) DEXA: N/A Mammogram: N/A  Star Rating Drugs: atorvastatin (LIPITOR) 80 MG tablet - last filled 09/30/20 90 days  glipiZIDE (GLUCOTROL) 5 MG tablet - last filled 10/01/20 90 days  lisinopril (ZESTRIL) 10 MG tablet - last filled 10/05/20 90 days  metFORMIN (GLUCOPHAGE) 1000 MG tablet - last filled 10/21/20 90 days   Future Appointments  Date Time Provider [place]1/25/2023  8:00 AM MaEulogio BearNP [facility] None  02/10/2021  2:45 PM MaGardiner BarefootDPM [facility]  02/28/2021  2:00 PM [facility] VASC 3 [facility] None  02/28/2021  3:00 PM Schnier, GrDolores LoryMD [facility] None    LiJobe GibbonCCMA Clinical Pharmacist Assistant  [phone] minutes spent in review, coordination, and documentation. Assisted PCP with DME order for Freestyle 2 Libre reader and sensors at her request.  [facility] Reviewed by: ChBeverly MilchPharmD Clinical Pharmacist [phone]

## 2020-12-16 ENCOUNTER — Ambulatory Visit (INDEPENDENT_AMBULATORY_CARE_PROVIDER_SITE_OTHER): Payer: PRIVATE HEALTH INSURANCE | Admitting: Nurse Practitioner

## 2020-12-16 ENCOUNTER — Encounter: Payer: Self-pay | Admitting: Nurse Practitioner

## 2020-12-16 ENCOUNTER — Other Ambulatory Visit: Payer: Self-pay

## 2020-12-16 DIAGNOSIS — E1169 Type 2 diabetes mellitus with other specified complication: Secondary | ICD-10-CM

## 2020-12-16 DIAGNOSIS — E785 Hyperlipidemia, unspecified: Secondary | ICD-10-CM

## 2020-12-16 MED ORDER — LANCETS MISC: Status: DC

## 2020-12-16 MED ORDER — GLUCOSE BLOOD VI STRP: ORAL_STRIP | 11 refills | Status: DC

## 2020-12-16 MED ORDER — BLOOD GLUCOSE SYSTEM PAK KIT: PACK | 1 refills | Status: DC

## 2020-12-16 NOTE — Progress Notes (Signed)
Subjective:    Patient ID: Dylan Robles, male    DOB: 1941-01-08, 80 y.o.   MRN: [id number]  HPI: Dylan Robles is a 80 y.o. male presenting for diabetes follow up.  Chief Complaint  Patient presents with   Follow-up    Requesting new glucose meter for arm    DIABETES Patient is here to request getting a new continuous glucose meter.  Worried that it put him in the donut hole last year.  Reports his blood sugar this morning was 90.  Does not regularly check his sugars otherwise because his meter is somewhat unreliable.  He is still taking insulin as prescribed.  Hypoglycemic episodes:no Polydipsia/polyuria: no Visual disturbance: no Chest pain: no Paresthesias: no Glucose Monitoring: yes  Accucheck frequency: a few times per week  Fasting glucose: today 90 Taking Insulin?: yes  Long acting insulin: 28 units nightly  Short acting insulin: 6 units with meals Blood Pressure Monitoring: not checking Retinal Examination: Up to Date Foot Exam: Up to Date Diabetic Education: Completed Pneumovax: Up to Date Influenza: Up to Date Aspirin: yes Statin: Atorvastatin 50 mg daily  Physical activity: works on a farm Dietary habits: does not watch what he eats A1c goal: less than 8%  Allergies  Allergen Reactions   Plavix [Clopidogrel] Hives    Outpatient Encounter Medications as of 12/16/2020  Medication Sig   amLODipine (NORVASC) 5 MG tablet TAKE 1 TABLET EVERY DAY   aspirin EC 81 MG tablet Take 81 mg by mouth daily.    atorvastatin (LIPITOR) 80 MG tablet TAKE 1 TABLET AT BEDTIME.   Blood Glucose Calibration (TRUE METRIX LEVEL 1) Low SOLN 1 each by In Vitro route as directed.   Blood Glucose Monitoring Suppl (BLOOD GLUCOSE SYSTEM PAK) KIT Please dispense based on patient and insurance preference.  Use as directed to monitor FSBS 4 x daily.  Dx E11.69   BRILINTA 60 MG TABS tablet Take 60 mg by mouth 2 (two) times daily.    diclofenac Sodium (VOLTAREN) 1 % GEL Apply 2 g topically  2 (two) times daily as needed.   DROPLET INSULIN SYRINGE 30G X 5/16" 1 ML MISC USE AS DIRECTED   ezetimibe (ZETIA) 10 MG tablet Take 10 mg by mouth daily.   furosemide (LASIX) 20 MG tablet Take 20 mg by mouth as needed.   gabapentin (NEURONTIN) 300 MG capsule Take 1 capsule (300 mg total) by mouth 3 (three) times daily.   glipiZIDE (GLUCOTROL) 5 MG tablet TAKE 1/2 TABLET DAILY BEFORE BREAKFAST   glucose blood test strip Please dispense based on patient and insurance preference.  Use as directed to monitor FSBS 4 x daily.  Dx E11.69   insulin aspart (NOVOLOG) 100 UNIT/ML injection Inject 5 Units into the skin 3 (three) times daily before meals.    isosorbide mononitrate (IMDUR) 30 MG 24 hr tablet TAKE 1 TABLET EVERY DAY   ketoconazole (NIZORAL) 2 % cream Apply 1 application topically daily.   ketorolac (ACULAR) 0.5 % ophthalmic solution    Lancets MISC Please dispense based on patient and insurance preference.  Use as directed to monitor FSBS 4 x daily.  Dx E11.69   LEVEMIR FLEXTOUCH 100 UNIT/ML Pen Inject 34 Units into the skin at bedtime.    levothyroxine (SYNTHROID) 88 MCG tablet TAKE 1 TABLET DAILY BEFORE BREAKFAST.   lisinopril (ZESTRIL) 10 MG tablet TAKE 1 TABLET EVERY DAY   metFORMIN (GLUCOPHAGE) 1000 MG tablet TAKE 1 TABLET 2 TIMES DAILY WITH A  MEAL   metoprolol succinate (TOPROL-XL) 50 MG 24 hr tablet Take 1 tablet (50 mg total) by mouth daily.   ofloxacin (OCUFLOX) 0.3 % ophthalmic solution    tamsulosin (FLOMAX) 0.4 MG CAPS capsule TAKE 1 CAPSULE AT BEDTIME   [DISCONTINUED] Blood Glucose Monitoring Suppl (TRUE METRIX AIR GLUCOSE METER) w/Device KIT 1 each by Does not apply route as directed.   [DISCONTINUED] glucose blood test strip 1 each by Other route as needed. Use as instructed   No facility-administered encounter medications on file as of 12/16/2020.    Patient Active Problem List   Diagnosis Date Noted   Stage 3a chronic kidney disease ([place]) 03/03/2020   Atherosclerosis of  native arteries of extremity with intermittent claudication ([place]) 12/19/2018   Hyperlipidemia 12/19/2018   Diabetic neuropathy ([place]) 09/26/2018   Type 2 diabetes mellitus with vascular disease ([place]) 09/26/2018   Coronary artery disease of native artery of native heart with stable angina pectoris ([place]) 09/20/2017   Type 2 diabetes mellitus with hyperlipidemia ([place]) 09/16/2008   Hypothyroidism 03/29/2006   Essential hypertension 01/13/2006   PERIPHERAL VASCULAR DISEASE 01/13/2006    Past Medical History:  Diagnosis Date   Acute lower UTI 09/20/2017   Bradycardia on ECG 07/31/2011   Coronary artery disease    hyperlipidemia   Decreased GFR 09/23/2019   Dehydration 09/21/2017   Diabetes mellitus    Diabetes mellitus without complication ([place])    Hypercholesterolemia    Hypertension    Hypothyroidism    Left arm pain 01/29/2020   Paronychia of great toe of left foot 09/26/2018   Paronychia of great toe, left 09/26/2018   PERIPHERAL NEUROPATHY 01/13/2006   Qualifier: Diagnosis of  By: Truett Mainland MD, Christine     Shortness of breath dyspnea    with exertion   Vasovagal syncope    Weakness 09/20/2017    Relevant past medical, surgical, family and social history reviewed and updated as indicated. Interim medical history since our last visit reviewed.  Review of Systems Per HPI unless specifically indicated above     Objective:    BP (!) 142/80   Pulse 69   Ht $R'6\' 6"'rr$  (1.981 m)   Wt 222 lb 12.8 oz (101.1 kg)   SpO2 97%   BMI 25.75 kg/m   Wt Readings from Last 3 Encounters:  12/16/20 222 lb 12.8 oz (101.1 kg)  11/03/20 220 lb (99.8 kg)  08/02/20 221 lb 9.6 oz (100.5 kg)    Physical Exam Vitals and nursing note reviewed.  Constitutional:      General: He is not in acute distress.    Appearance: Normal appearance. He is not toxic-appearing.  HENT:     Head: Normocephalic and atraumatic.  Eyes:     General: No scleral icterus.    Extraocular Movements: Extraocular movements intact.   Cardiovascular:     Rate and Rhythm: Normal rate.  Pulmonary:     Effort: Pulmonary effort is normal. No respiratory distress.  Skin:    General: Skin is warm and dry.     Capillary Refill: Capillary refill takes less than 2 seconds.     Coloration: Skin is not jaundiced or pale.     Findings: No erythema.  Neurological:     Mental Status: He is alert and oriented to person, place, and time.     Motor: No weakness.     Gait: Gait normal.  Psychiatric:        Mood and Affect: Mood normal.  Behavior: Behavior normal.        Thought Content: Thought content normal.        Judgment: Judgment normal.      Assessment & Plan:   Problem List Items Addressed This Visit       Endocrine   Type 2 diabetes mellitus with hyperlipidemia (HCC)    Chronic.  Patient would like to be re-set up with a continuous glucose meter.  I agree with this plan and do think that he would benefit from once since he is on multiple times daily insulin including meal time insulin.  He has been set up with the pharmacist to help with cost of medications.  I will reach out to the pharmacist today to see if we can assist with ordering CGM through insurance and making sure that the patient does not have to pay a co-pay for it.  This would greatly help with the management of this patient's diabetes.  Not due for blood work today.  Eye and foot exam are up to date.  Follow up in 1.5 months as planned.      Relevant Medications   Lancets MISC   Blood Glucose Monitoring Suppl (BLOOD GLUCOSE SYSTEM PAK) KIT   glucose blood test strip     Follow up plan: Return for as scheduled.

## 2020-12-16 NOTE — Assessment & Plan Note (Signed)
Chronic.  Patient would like to be re-set up with a continuous glucose meter.  I agree with this plan and do think that he would benefit from once since he is on multiple times daily insulin including meal time insulin.  He has been set up with the pharmacist to help with cost of medications.  I will reach out to the pharmacist today to see if we can assist with ordering CGM through insurance and making sure that the patient does not have to pay a co-pay for it.  This would greatly help with the management of this patient's diabetes.  Not due for blood work today.  Eye and foot exam are up to date.  Follow up in 1.5 months as planned.

## 2020-12-22 ENCOUNTER — Encounter: Payer: Self-pay | Admitting: Nurse Practitioner

## 2020-12-24 NOTE — Chronic Care Management (AMB) (Signed)
12 minutes spent in review, coordination, and documentation.  First order to [facility] did not go through due to not being in network.  Order was cancelled.  Have re submitted to [facility] to see if they are in network  Reviewed by: Beverly Milch, PharmD Clinical Pharmacist [phone]

## 2020-12-31 ENCOUNTER — Emergency Department (HOSPITAL_COMMUNITY): Payer: Medicare HMO

## 2020-12-31 ENCOUNTER — Emergency Department (HOSPITAL_COMMUNITY)
Admission: EM | Admit: 2020-12-31 | Discharge: 2020-12-31 | Disposition: A | Discharge status: Home / Self Care | Payer: Medicare HMO | Attending: Emergency Medicine | Admitting: Emergency Medicine

## 2020-12-31 ENCOUNTER — Encounter (HOSPITAL_COMMUNITY): Payer: Self-pay | Admitting: Emergency Medicine

## 2020-12-31 DIAGNOSIS — Z79899 Other long term (current) drug therapy: Secondary | ICD-10-CM | POA: Diagnosis not present

## 2020-12-31 DIAGNOSIS — E1151 Type 2 diabetes mellitus with diabetic peripheral angiopathy without gangrene: Secondary | ICD-10-CM | POA: Diagnosis not present

## 2020-12-31 DIAGNOSIS — U071 COVID-19: Secondary | ICD-10-CM | POA: Diagnosis not present

## 2020-12-31 DIAGNOSIS — I129 Hypertensive chronic kidney disease with stage 1 through stage 4 chronic kidney disease, or unspecified chronic kidney disease: Secondary | ICD-10-CM | POA: Diagnosis not present

## 2020-12-31 DIAGNOSIS — Z794 Long term (current) use of insulin: Secondary | ICD-10-CM | POA: Diagnosis not present

## 2020-12-31 DIAGNOSIS — I2510 Atherosclerotic heart disease of native coronary artery without angina pectoris: Secondary | ICD-10-CM | POA: Insufficient documentation

## 2020-12-31 DIAGNOSIS — Z7982 Long term (current) use of aspirin: Secondary | ICD-10-CM | POA: Insufficient documentation

## 2020-12-31 DIAGNOSIS — R531 Weakness: Secondary | ICD-10-CM | POA: Diagnosis present

## 2020-12-31 DIAGNOSIS — N1831 Chronic kidney disease, stage 3a: Secondary | ICD-10-CM | POA: Insufficient documentation

## 2020-12-31 DIAGNOSIS — R059 Cough, unspecified: Secondary | ICD-10-CM | POA: Diagnosis not present

## 2020-12-31 DIAGNOSIS — E1140 Type 2 diabetes mellitus with diabetic neuropathy, unspecified: Secondary | ICD-10-CM | POA: Insufficient documentation

## 2020-12-31 DIAGNOSIS — Z87891 Personal history of nicotine dependence: Secondary | ICD-10-CM | POA: Diagnosis not present

## 2020-12-31 DIAGNOSIS — I700 Atherosclerosis of aorta: Secondary | ICD-10-CM | POA: Diagnosis not present

## 2020-12-31 DIAGNOSIS — J111 Influenza due to unidentified influenza virus with other respiratory manifestations: Secondary | ICD-10-CM | POA: Diagnosis not present

## 2020-12-31 DIAGNOSIS — R739 Hyperglycemia, unspecified: Secondary | ICD-10-CM | POA: Diagnosis not present

## 2020-12-31 DIAGNOSIS — Z7984 Long term (current) use of oral hypoglycemic drugs: Secondary | ICD-10-CM | POA: Diagnosis not present

## 2020-12-31 DIAGNOSIS — E1122 Type 2 diabetes mellitus with diabetic chronic kidney disease: Secondary | ICD-10-CM | POA: Diagnosis not present

## 2020-12-31 DIAGNOSIS — R55 Syncope and collapse: Secondary | ICD-10-CM | POA: Diagnosis not present

## 2020-12-31 DIAGNOSIS — E039 Hypothyroidism, unspecified: Secondary | ICD-10-CM | POA: Insufficient documentation

## 2020-12-31 DIAGNOSIS — R5383 Other fatigue: Secondary | ICD-10-CM | POA: Diagnosis not present

## 2020-12-31 DIAGNOSIS — I10 Essential (primary) hypertension: Secondary | ICD-10-CM | POA: Diagnosis not present

## 2020-12-31 LAB — CBC WITH DIFFERENTIAL/PLATELET
Abs Immature Granulocytes: 0.03 10*3/uL (ref 0.00–0.07)
Basophils Absolute: 0 10*3/uL (ref 0.0–0.1)
Basophils Relative: 0 %
Eosinophils Absolute: 0.1 10*3/uL (ref 0.0–0.5)
Eosinophils Relative: 1 %
HCT: 36.4 % — ABNORMAL LOW (ref 39.0–52.0)
Hemoglobin: 11.6 g/dL — ABNORMAL LOW (ref 13.0–17.0)
Immature Granulocytes: 0 %
Lymphocytes Relative: 9 %
Lymphs Abs: 0.9 10*3/uL (ref 0.7–4.0)
MCH: 29.6 pg (ref 26.0–34.0)
MCHC: 31.9 g/dL (ref 30.0–36.0)
MCV: 92.9 fL (ref 80.0–100.0)
Monocytes Absolute: 0.7 10*3/uL (ref 0.1–1.0)
Monocytes Relative: 8 %
Neutro Abs: 7.7 10*3/uL (ref 1.7–7.7)
Neutrophils Relative %: 82 %
Platelets: 196 10*3/uL (ref 150–400)
RBC: 3.92 MIL/uL — ABNORMAL LOW (ref 4.22–5.81)
RDW: 14.5 % (ref 11.5–15.5)
WBC: 9.4 10*3/uL (ref 4.0–10.5)
nRBC: 0 % (ref 0.0–0.2)

## 2020-12-31 LAB — COMPREHENSIVE METABOLIC PANEL
ALT: 12 U/L (ref 0–44)
AST: 15 U/L (ref 15–41)
Albumin: 3.3 g/dL — ABNORMAL LOW (ref 3.5–5.0)
Alkaline Phosphatase: 76 U/L (ref 38–126)
Anion gap: 9 (ref 5–15)
BUN: 20 mg/dL (ref 8–23)
CO2: 21 mmol/L — ABNORMAL LOW (ref 22–32)
Calcium: 8.6 mg/dL — ABNORMAL LOW (ref 8.9–10.3)
Chloride: 100 mmol/L (ref 98–111)
Creatinine, Ser: 1.52 mg/dL — ABNORMAL HIGH (ref 0.61–1.24)
GFR, Estimated: 45 mL/min — ABNORMAL LOW (ref >60)
Glucose, Bld: 333 mg/dL — ABNORMAL HIGH (ref 70–99)
Potassium: 4.3 mmol/L (ref 3.5–5.1)
Sodium: 130 mmol/L — ABNORMAL LOW (ref 135–145)
Total Bilirubin: 0.4 mg/dL (ref 0.3–1.2)
Total Protein: 6.3 g/dL — ABNORMAL LOW (ref 6.5–8.1)

## 2020-12-31 LAB — RESP PANEL BY RT-PCR (FLU A&B, COVID) ARPGX2
Influenza A by PCR: NEGATIVE
Influenza B by PCR: NEGATIVE
SARS Coronavirus 2 by RT PCR: POSITIVE — AB

## 2020-12-31 LAB — TROPONIN I (HIGH SENSITIVITY)
Troponin I (High Sensitivity): 5 ng/L (ref <18)
Troponin I (High Sensitivity): 7 ng/L (ref <18)

## 2020-12-31 MED ORDER — SODIUM CHLORIDE 0.9 % IV BOLUS
1000.0000 mL | Freq: Once | INTRAVENOUS | Status: AC
  Administered 2020-12-31: 20:00:00 1000 mL via INTRAVENOUS

## 2020-12-31 MED ORDER — MOLNUPIRAVIR EUA 200MG CAPSULE
4.0000 | ORAL_CAPSULE | Freq: Two times a day (BID) | ORAL | 0 refills | Status: AC
Start: 2020-12-31 — End: 2021-01-05

## 2020-12-31 NOTE — ED Triage Notes (Signed)
Pt to ED via EMS from home c/o flu like symptoms that started 1 week ago.  Temp 99.7 1000mg  Tylenol given by EMS. #18LAC. 400ML NS given by EMS. CBG 333; hx DM, HTN . Last VS: 130/81, 100%ra, rr14, 79HR

## 2020-12-31 NOTE — ED Provider Notes (Signed)
[facility] Provider Note   CSN: [id number] Arrival date & time: 12/31/20  1904     History No chief complaint on file.   Dylan Robles is a 80 y.o. male. Patient presents the emergency department with complaints of generalized weakness.  He says that he had a cold that started on Monday.  He started to feel slightly better, however over the last day or 2 he has felt progressively more weak.  His body just feels generally fatigued.  He is not having any difficulties walking or getting around the house.  He says that his cough is actually improved from his cold earlier in the week.  He has been kind of congested.   Denies any fevers or chills.  He has not had any changes to his medications.  He denies any chest pain, shortness of breath, abdominal pain, nausea, vomiting, diarrhea, headaches, changes in urination.   HPI     Past Medical History:  Diagnosis Date   Acute lower UTI 09/20/2017   Bradycardia on ECG 07/31/2011   Coronary artery disease    hyperlipidemia   Decreased GFR 09/23/2019   Dehydration 09/21/2017   Diabetes mellitus    Diabetes mellitus without complication ([place])    Hypercholesterolemia    Hypertension    Hypothyroidism    Left arm pain 01/29/2020   Paronychia of great toe of left foot 09/26/2018   Paronychia of great toe, left 09/26/2018   PERIPHERAL NEUROPATHY 01/13/2006   Qualifier: Diagnosis of  By: Truett Mainland MD, Christine     Shortness of breath dyspnea    with exertion   Vasovagal syncope    Weakness 09/20/2017    Patient Active Problem List   Diagnosis Date Noted   Stage 3a chronic kidney disease ([place]) 03/03/2020   Atherosclerosis of native arteries of extremity with intermittent claudication ([place]) 12/19/2018   Hyperlipidemia 12/19/2018   Diabetic neuropathy ([place]) 09/26/2018   Type 2 diabetes mellitus with vascular disease ([place]) 09/26/2018   Coronary artery disease of native artery of native heart with stable angina pectoris ([place])  09/20/2017   Type 2 diabetes mellitus with hyperlipidemia ([place]) 09/16/2008   Hypothyroidism 03/29/2006   Essential hypertension 01/13/2006   PERIPHERAL VASCULAR DISEASE 01/13/2006    Past Surgical History:  Procedure Laterality Date   APPENDECTOMY     CARDIAC CATHETERIZATION N/A 06/16/2014   Procedure: Left Heart Cath;  Surgeon: Dionisio David, MD;  Location: [facility];  Service: Cardiovascular;  Laterality: N/A;   CIRCUMCISION, NON-NEWBORN     HERNIA REPAIR     LOWER EXTREMITY ANGIOGRAPHY Left 12/24/2018   Procedure: LOWER EXTREMITY ANGIOGRAPHY;  Surgeon: Katha Cabal, MD;  Location: [facility];  Service: Cardiovascular;  Laterality: Left;   THYROID SURGERY         Family History  Problem Relation Age of Onset   Diabetes Father    Hypertension Father    Cancer Mother    Diabetes Brother     Social History   Tobacco Use   Smoking status: Former   Smokeless tobacco: Never  Substance Use Topics   Alcohol use: No   Drug use: No    Home Medications Prior to Admission medications   Medication Sig Start Date End Date Taking? Authorizing Provider  molnupiravir EUA (LAGEVRIO) 200 mg CAPS capsule Take 4 capsules (800 mg total) by mouth 2 (two) times daily for 5 days. 12/31/20 01/05/21 Yes Nicholai Willette, Adora Fridge, PA-C  amLODipine (NORVASC) 5 MG  tablet TAKE 1 TABLET EVERY DAY 07/21/20   [name], NP  aspirin EC 81 MG tablet Take 81 mg by mouth daily.     [provider]  atorvastatin (LIPITOR) 80 MG tablet TAKE 1 TABLET AT BEDTIME. 07/16/20   Noemi Chapel A, NP  Blood Glucose Calibration (TRUE METRIX LEVEL 1) Low SOLN 1 each by In Vitro route as directed. 08/15/19   Ishmael Holter A, FNP  Blood Glucose Monitoring Suppl (BLOOD GLUCOSE SYSTEM PAK) KIT Please dispense based on patient and insurance preference.  Use as directed to monitor FSBS 4 x daily.  Dx E11.69 12/16/20   [name], NP  BRILINTA 60 MG TABS tablet Take 60 mg by  mouth 2 (two) times daily.  12/02/18   [provider]  diclofenac Sodium (VOLTAREN) 1 % GEL Apply 2 g topically 2 (two) times daily as needed. 01/29/20   [name], NP  DROPLET INSULIN SYRINGE 30G X 5/16" 1 ML MISC USE AS DIRECTED 09/23/19   Ishmael Holter A, FNP  ezetimibe (ZETIA) 10 MG tablet Take 10 mg by mouth daily. 09/09/19   [provider]  furosemide (LASIX) 20 MG tablet Take 20 mg by mouth as needed.    [provider]  gabapentin (NEURONTIN) 300 MG capsule Take 1 capsule (300 mg total) by mouth 3 (three) times daily. 01/29/20   [name], NP  glipiZIDE (GLUCOTROL) 5 MG tablet TAKE 1/2 TABLET DAILY BEFORE BREAKFAST 07/16/20   Noemi Chapel A, NP  glucose blood test strip Please dispense based on patient and insurance preference.  Use as directed to monitor FSBS 4 x daily.  Dx E11.69 12/16/20   [name], NP  insulin aspart (NOVOLOG) 100 UNIT/ML injection Inject 5 Units into the skin 3 (three) times daily before meals.     [provider]  isosorbide mononitrate (IMDUR) 30 MG 24 hr tablet TAKE 1 TABLET EVERY DAY 09/08/20   [name], NP  ketoconazole (NIZORAL) 2 % cream Apply 1 application topically daily. 02/04/20   Criselda Peaches, DPM  ketorolac (ACULAR) 0.5 % ophthalmic solution  05/10/20   [provider]  Lancets MISC Please dispense based on patient and insurance preference.  Use as directed to monitor FSBS 4 x daily.  Dx E11.69 12/16/20   [name], NP  LEVEMIR FLEXTOUCH 100 UNIT/ML Pen Inject 34 Units into the skin at bedtime.  11/22/18   [provider]  levothyroxine (SYNTHROID) 88 MCG tablet TAKE 1 TABLET DAILY BEFORE BREAKFAST. 07/21/20   [name], NP  lisinopril (ZESTRIL) 10 MG tablet TAKE 1 TABLET EVERY DAY 07/27/20   Susy Frizzle, MD  metFORMIN (GLUCOPHAGE) 1000 MG tablet TAKE 1 TABLET 2 TIMES DAILY WITH A MEAL 07/28/20   Noemi Chapel A, NP  metoprolol  succinate (TOPROL-XL) 50 MG 24 hr tablet Take 1 tablet (50 mg total) by mouth daily. 03/19/19   Annie Main, FNP  ofloxacin (OCUFLOX) 0.3 % ophthalmic solution  05/10/20   [provider]  tamsulosin (FLOMAX) 0.4 MG CAPS capsule TAKE 1 CAPSULE AT BEDTIME 12/06/20   [name], NP    Allergies    Plavix [clopidogrel]  Review of Systems   Review of Systems  Constitutional:  Positive for fatigue.  HENT:  Positive for congestion.   Respiratory:  Positive for cough. Negative for shortness of breath.   Cardiovascular:  Negative for chest pain.  Neurological:  Positive for weakness.  All other systems reviewed and are negative.  Physical Exam Updated Vital Signs BP (!) 142/81    Pulse 66    Temp 98.4 F (36.9 C) (Oral)    Resp 14    Ht $R'6\' 6"'LQ$  (1.981 m)    Wt 100.7 kg    SpO2 99%    BMI 25.65 kg/m   Physical Exam Vitals and nursing note reviewed.  Constitutional:      General: He is not in acute distress.    Appearance: Normal appearance. He is not ill-appearing, toxic-appearing or diaphoretic.  HENT:     Head: Normocephalic and atraumatic.     Nose: No nasal deformity.     Mouth/Throat:     Lips: Pink. No lesions.     Mouth: Mucous membranes are moist. No injury, lacerations, oral lesions or angioedema.     Pharynx: Oropharynx is clear. Uvula midline. No pharyngeal swelling, oropharyngeal exudate, posterior oropharyngeal erythema or uvula swelling.  Eyes:     General: Gaze aligned appropriately. No scleral icterus.       Right eye: No discharge.        Left eye: No discharge.     Conjunctiva/sclera: Conjunctivae normal.     Right eye: Right conjunctiva is not injected. No exudate or hemorrhage.    Left eye: Left conjunctiva is not injected. No exudate or hemorrhage. Cardiovascular:     Rate and Rhythm: Normal rate and regular rhythm.     Pulses: Normal pulses.          Radial pulses are 2+ on the right side and 2+ on the left side.       Dorsalis pedis pulses  are 2+ on the right side and 2+ on the left side.     Heart sounds: Normal heart sounds, S1 normal and S2 normal. Heart sounds not distant. No murmur heard.   No friction rub. No gallop. No S3 or S4 sounds.  Pulmonary:     Effort: Pulmonary effort is normal. No accessory muscle usage or respiratory distress.     Breath sounds: Normal breath sounds. No stridor. No wheezing, rhonchi or rales.  Chest:     Chest wall: No tenderness.  Abdominal:     General: Abdomen is flat. Bowel sounds are normal. There is no distension.     Palpations: Abdomen is soft. There is no mass or pulsatile mass.     Tenderness: There is no abdominal tenderness. There is no guarding or rebound.  Musculoskeletal:     Right lower leg: No edema.     Left lower leg: No edema.  Skin:    General: Skin is warm and dry.     Coloration: Skin is not jaundiced or pale.     Findings: No bruising, erythema, lesion or rash.  Neurological:     General: No focal deficit present.     Mental Status: He is alert and oriented to person, place, and time.     GCS: GCS eye subscore is 4. GCS verbal subscore is 5. GCS motor subscore is 6.     Comments: Alert and Oriented x 3 Speech clear with no aphasia Motor: - 5/5 motor strength in all four extremities.  - Normal tone   Psychiatric:        Mood and Affect: Mood normal.        Behavior: Behavior normal. Behavior is cooperative.    ED Results / Procedures / Treatments   Labs (all labs ordered are listed, but only  abnormal results are displayed) Labs Reviewed  RESP PANEL BY RT-PCR (FLU A&B, COVID) ARPGX2 - Abnormal; Notable for the following components:      Result Value   SARS Coronavirus 2 by RT PCR POSITIVE (*)    All other components within normal limits  COMPREHENSIVE METABOLIC PANEL - Abnormal; Notable for the following components:   Sodium 130 (*)    CO2 21 (*)    Glucose, Bld 333 (*)    Creatinine, Ser 1.52 (*)    Calcium 8.6 (*)    Total Protein 6.3 (*)     Albumin 3.3 (*)    GFR, Estimated 45 (*)    All other components within normal limits  CBC WITH DIFFERENTIAL/PLATELET - Abnormal; Notable for the following components:   RBC 3.92 (*)    Hemoglobin 11.6 (*)    HCT 36.4 (*)    All other components within normal limits  URINALYSIS, ROUTINE W REFLEX MICROSCOPIC  TROPONIN I (HIGH SENSITIVITY)  TROPONIN I (HIGH SENSITIVITY)    EKG EKG Interpretation  Date/Time:  Friday December 31 2020 19:35:26 EST Ventricular Rate:  75 PR Interval:  180 QRS Duration: 87 QT Interval:  397 QTC Calculation: 444 R Axis:   3 Text Interpretation: Sinus rhythm Abnormal R-wave progression, early transition Confirmed by Nanda Quinton [phone]) on 12/31/2020 7:47:15 PM  Radiology DG Chest Port 1 View  Result Date: 12/31/2020 CLINICAL DATA:  Flu symptoms today. EXAM: PORTABLE CHEST 1 VIEW COMPARISON:  03/31/2020 FINDINGS: Numerous leads and wires project over the chest. Midline trachea. Normal heart size. Atherosclerosis in the transverse aorta. No pleural effusion or pneumothorax. Clear lungs. IMPRESSION: No acute cardiopulmonary disease. Aortic Atherosclerosis (ICD10-I70.0). Electronically Signed   By: Abigail Miyamoto M.D.   On: 12/31/2020 19:48    Procedures Procedures   Medications Ordered in ED Medications  sodium chloride 0.9 % bolus 1,000 mL (0 mLs Intravenous Stopped 12/31/20 2212)    ED Course  I have reviewed the triage vital signs and the nursing notes.  Pertinent labs & imaging results that were available during my care of the patient were reviewed by me and considered in my medical decision making (see chart for details).  Clinical Course as of 12/31/20 2217  Fri Dec 31, 2020  2039 SARS Coronavirus 2 by RT PCR(!): POSITIVE [GL]    Clinical Course User Index [GL] Dylan Robles, Adora Fridge, PA-C   MDM Rules/Calculators/A&P                           This is a 81 y.o. male with a PMH of thyroidism, diabetes, hyperlipidemia, PVD, coronary artery  disease, stage III CKD Who presents to the ED with pains of worsening generalized weakness and fatigue.  He started having flu like symptoms on Monday and feels like his fatigue has progressively gotten worse over the past couple days.  Vitals: Stable.  Afebrile  Exam: Unremarkable. lungs are clear. Normal Neuro exam.  Initial Impression: Unclear At this time.  Today basic labs, troponins, urine, respiratory panel, chest x-ray, and EKG.  I personally reviewed all laboratory work and imaging. Abnormal results outlined below. Hyponatremia to 130, however glucose is 333 so this will be corrected to normal.  No anion gap or signs of acidosis.  Creatinine is stable for baseline.  Patient with no leukocytosis.  Anemia stable from baseline.  Troponin is negative.  He did test positive for COVID-19.  EKG unremarkable.  Chest x-ray without acute abnormality.  Events/Interventions: IVF  Impression: Weakness is likely secondary to COVID-19 infection.  No signs of pneumonia on x-ray.  Patient appears well.  On reevaluation, patient is feeling much better after fluids.  His vitals have remained stable.  I feel he is stable for discharge home with antiviral therapy.  He is not a great candidate for Paxlovid due to medication interactions and chronic kidney disease.  We will give alternate antiviral.  Dispo Plan: 5 days of Molnupiravir  I have seen and evaluated this patient in conjunction with my attending physician who agrees and has made changes to the plan accordingly.  Portions of this note were generated with [employer]. Dictation errors may occur despite best attempts at proofreading.   Final Clinical Impression(s) / ED Diagnoses Final diagnoses:  COVID    Rx / DC Orders ED Discharge Orders          Ordered    molnupiravir EUA (LAGEVRIO) 200 mg CAPS capsule  2 times daily        12/31/20 2148             Sheila Oats 12/31/20 2217    Margette Fast,  MD 01/04/21 [phone]

## 2020-12-31 NOTE — Discharge Instructions (Signed)
You have tested positive for COVID-19. Please isolate from others until at least Sunday. If you develop worsening shortness of breath, or are not improving over the next week please return to be reevaluated.   I have prescribed you an antiviral medication that helps fight against COVID-19. Please take this for 5 days.

## 2020-12-31 NOTE — ED Notes (Signed)
Pt d/c home per MD order. Discharge summary reviewed, pt verbalizes understanding. Off unit via [place], Discharged home with spouse. No s/s of acute distress noted.

## 2021-01-10 ENCOUNTER — Other Ambulatory Visit: Payer: Self-pay | Admitting: Nurse Practitioner

## 2021-01-10 DIAGNOSIS — E1169 Type 2 diabetes mellitus with other specified complication: Secondary | ICD-10-CM

## 2021-01-31 DIAGNOSIS — I10 Essential (primary) hypertension: Secondary | ICD-10-CM | POA: Diagnosis not present

## 2021-01-31 DIAGNOSIS — I340 Nonrheumatic mitral (valve) insufficiency: Secondary | ICD-10-CM | POA: Diagnosis not present

## 2021-01-31 DIAGNOSIS — E119 Type 2 diabetes mellitus without complications: Secondary | ICD-10-CM | POA: Diagnosis not present

## 2021-01-31 DIAGNOSIS — R0602 Shortness of breath: Secondary | ICD-10-CM | POA: Diagnosis not present

## 2021-01-31 DIAGNOSIS — E785 Hyperlipidemia, unspecified: Secondary | ICD-10-CM | POA: Diagnosis not present

## 2021-01-31 DIAGNOSIS — I2510 Atherosclerotic heart disease of native coronary artery without angina pectoris: Secondary | ICD-10-CM | POA: Diagnosis not present

## 2021-02-02 ENCOUNTER — Encounter: Payer: Self-pay | Admitting: Nurse Practitioner

## 2021-02-02 ENCOUNTER — Ambulatory Visit (INDEPENDENT_AMBULATORY_CARE_PROVIDER_SITE_OTHER): Payer: PRIVATE HEALTH INSURANCE | Admitting: Nurse Practitioner

## 2021-02-02 ENCOUNTER — Other Ambulatory Visit: Payer: Self-pay

## 2021-02-02 VITALS — BP 148/80 | HR 76 | Ht 78.0 in | Wt 222.0 lb

## 2021-02-02 DIAGNOSIS — R067 Sneezing: Secondary | ICD-10-CM

## 2021-02-02 DIAGNOSIS — N1831 Chronic kidney disease, stage 3a: Secondary | ICD-10-CM

## 2021-02-02 DIAGNOSIS — E1159 Type 2 diabetes mellitus with other circulatory complications: Secondary | ICD-10-CM | POA: Diagnosis not present

## 2021-02-02 DIAGNOSIS — I10 Essential (primary) hypertension: Secondary | ICD-10-CM | POA: Diagnosis not present

## 2021-02-02 DIAGNOSIS — E1169 Type 2 diabetes mellitus with other specified complication: Secondary | ICD-10-CM | POA: Diagnosis not present

## 2021-02-02 DIAGNOSIS — E785 Hyperlipidemia, unspecified: Secondary | ICD-10-CM

## 2021-02-02 DIAGNOSIS — E782 Mixed hyperlipidemia: Secondary | ICD-10-CM | POA: Diagnosis not present

## 2021-02-02 DIAGNOSIS — I25118 Atherosclerotic heart disease of native coronary artery with other forms of angina pectoris: Secondary | ICD-10-CM | POA: Diagnosis not present

## 2021-02-02 MED ORDER — CETIRIZINE HCL 5 MG PO TABS: 5.0000 mg | ORAL_TABLET | Freq: Every day | ORAL | 1 refills | Status: DC

## 2021-02-02 NOTE — Assessment & Plan Note (Signed)
Chronic.  LDL goal less than 70 given history of stent placement and diabetes.  Continue collaboration with Cardiology.

## 2021-02-02 NOTE — Assessment & Plan Note (Signed)
Chronic.  Continue collaboration with Vascular Surgeon, Cardiologist, and continue current medications.

## 2021-02-02 NOTE — Assessment & Plan Note (Signed)
Chronic.  Recent A1c was 9.8%.  Goal for this patient is less than 8% given comorbidities and age.  LDL goal less than 70 - recent was 73 and patient was not fasting.  Still not taking insulin consistently and drinking sugary drinks.  I am going to reach out to the pharmacist to see where we are at with getting the patient a continuous glucose meter.  We really need his blood sugar readings to accurately titrate his insulin.  Plan to continue Novolog 6 units daily with meals and Levemir 38 units daily.  Foot exam and eye exam are up to date.  He is on high intensity statin and follows regularly with Cardiology and Podiatry.  Check A1c today.  Encouraged closed follow up with new PCP to titrate insulin and gain better blood sugar control.

## 2021-02-02 NOTE — Assessment & Plan Note (Signed)
Chronic.  BP improved upon recheck today.  Goal less than 140/90.  Continue current medications and collaboration with Cardiology.  Check electrolytes and kidney function today.

## 2021-02-02 NOTE — Assessment & Plan Note (Signed)
Chronic.  Check kidney function today and titrate medication as indicated.

## 2021-02-02 NOTE — Assessment & Plan Note (Addendum)
Chronic.  Recent A1c was 9.8%.  Goal for this patient is less than 8% given comorbidities and age.  LDL goal less than 70 - recent was 73 and patient was not fasting.  Still not taking insulin consistently and drinking sugary drinks.  I am going to reach out to the pharmacist to see where we are at with getting the patient a continuous glucose meter.  We really need his blood sugar readings to accurately titrate his insulin.  Plan to continue Novolog 6 units daily with meals and Levemir 38 units daily.  Foot exam and eye exam are up to date.  He is on high intensity statin and follows regularly with Cardiology and Podiatry.  Check A1c today.  Encouraged closed follow up with new PCP to titrate insulin and gain better blood sugar control.

## 2021-02-02 NOTE — Progress Notes (Signed)
Subjective:    Patient ID: Dylan Robles, male    DOB: 01-Jan-1941, 81 y.o.   MRN: [id number]  HPI: Dylan Robles is a 81 y.o. male presenting with wife for follow up.  Chief Complaint  Patient presents with   Diabetes   HYPERTENSION / HYPERLIPIDEMIA Currently taking amlodipine 5 mg, furosemide 20 mg daily as needed for swelling, lisinopril 10 mg daily, and metoprolol succinate 50 mg daily for blood pressure.  He is taking atorvastatin 80 mg daily, Brilinta 60 mg daily, Zetia 10 mg daily for high cholesterol and coronary artery disease.  Heart catheterization in 2016 done and showed high grade lesion of proximal RCA and stent was placed. BP monitoring frequency: not checking BP range:  Aspirin: yes Recent stressors: no Recurrent headaches: no Visual changes: no Palpitations: no Dyspnea: no Chest pain: no Lower extremity edema: no Dizzy/lightheaded: no Myalgias: no The ASCVD Risk score (Arnett DK, et al., 2019) failed to calculate for the following reasons:   The 2019 ASCVD risk score is only valid for ages 51 to 39  Saw Dr. Humphrey Rolls early this week.  Apparently he ordered a stress test and echocardiogram due to increased shortness of breath with activity.   Reports he has still not received CPAP from home health agency.  DIABETES He tells me today he has been trying to watch what he is eating.  However, he is still drinking soda at least once daily.  He does not drink sweet tea.  He tells me he is taking 28 units and sometimes 38 units of Levemir at nighttime.  He takes mealtime insulin 6 units 3 times daily with meals.  He forgets his nighttime insulin a few times per week.[name]  He is not checking his blood sugars.  We have been trying to get him a continuous blood glucose meter, however it was going to cost $100 per month so he declined.  He has been referred to our chronic care management team. Hypoglycemic episodes:no Polydipsia/polyuria: no Visual disturbance: no Chest pain:  no Paresthesias: no Glucose Monitoring: no Taking Insulin?: yes  Long acting insulin: 28 - 38 units  Short acting insulin: 6 units with meals Blood Pressure Monitoring: not checking Retinal Examination: Up to Date Foot Exam: Up to Date Diabetic Education: Completed Pneumovax: Up-to-date Influenza:  Given today Aspirin: yes  CHRONIC KIDNEY DISEASE CKD status: stable Medications renally dose: yes Previous renal evaluation: no Pneumovax:  Up to Date Influenza Vaccine:  Up to Date  Allergies  Allergen Reactions   Plavix [Clopidogrel] Hives    Outpatient Encounter Medications as of 02/02/2021  Medication Sig   amLODipine (NORVASC) 5 MG tablet TAKE 1 TABLET EVERY DAY   aspirin EC 81 MG tablet Take 81 mg by mouth daily.    atorvastatin (LIPITOR) 80 MG tablet TAKE 1 TABLET AT BEDTIME.   Blood Glucose Calibration (TRUE METRIX LEVEL 1) Low SOLN 1 each by In Vitro route as directed.   Blood Glucose Monitoring Suppl (BLOOD GLUCOSE SYSTEM PAK) KIT Please dispense based on patient and insurance preference.  Use as directed to monitor FSBS 4 x daily.  Dx E11.69   diclofenac Sodium (VOLTAREN) 1 % GEL Apply 2 g topically 2 (two) times daily as needed.   DROPLET INSULIN SYRINGE 30G X 5/16" 1 ML MISC USE AS DIRECTED   ezetimibe (ZETIA) 10 MG tablet Take 10 mg by mouth daily.   furosemide (LASIX) 20 MG tablet Take 20 mg by mouth as needed.  gabapentin (NEURONTIN) 300 MG capsule Take 1 capsule (300 mg total) by mouth 3 (three) times daily.   glipiZIDE (GLUCOTROL) 5 MG tablet TAKE 1/2 TABLET DAILY BEFORE BREAKFAST   glucose blood test strip Please dispense based on patient and insurance preference.  Use as directed to monitor FSBS 4 x daily.  Dx E11.69   insulin aspart (NOVOLOG) 100 UNIT/ML injection Inject 5 Units into the skin 3 (three) times daily before meals.    isosorbide mononitrate (IMDUR) 30 MG 24 hr tablet TAKE 1 TABLET EVERY DAY   ketoconazole (NIZORAL) 2 % cream Apply 1 application  topically daily.   ketorolac (ACULAR) 0.5 % ophthalmic solution    LEVEMIR FLEXTOUCH 100 UNIT/ML Pen Inject 34 Units into the skin at bedtime.    levothyroxine (SYNTHROID) 88 MCG tablet TAKE 1 TABLET DAILY BEFORE BREAKFAST.   lisinopril (ZESTRIL) 10 MG tablet TAKE 1 TABLET EVERY DAY   metFORMIN (GLUCOPHAGE) 1000 MG tablet TAKE 1 TABLET 2 TIMES DAILY WITH A MEAL   metoprolol succinate (TOPROL-XL) 50 MG 24 hr tablet Take 1 tablet (50 mg total) by mouth daily.   ofloxacin (OCUFLOX) 0.3 % ophthalmic solution    tamsulosin (FLOMAX) 0.4 MG CAPS capsule TAKE 1 CAPSULE AT BEDTIME   TRUEplus Lancets 33G MISC USE AS DIRECTED TO MONITOR FINGER STICK BLOOD SUGAR FOUR TIMES DAILY   [DISCONTINUED] BRILINTA 60 MG TABS tablet Take 60 mg by mouth 2 (two) times daily.    [DISCONTINUED] cetirizine (ZYRTEC) 5 MG tablet Take 1 tablet (5 mg total) by mouth daily.   cetirizine (ZYRTEC) 5 MG tablet Take 1 tablet (5 mg total) by mouth daily.   No facility-administered encounter medications on file as of 02/02/2021.    Patient Active Problem List   Diagnosis Date Noted   Stage 3a chronic kidney disease ([place]) 03/03/2020   Atherosclerosis of native arteries of extremity with intermittent claudication ([place]) 12/19/2018   Hyperlipidemia 12/19/2018   Diabetic neuropathy ([place]) 09/26/2018   Type 2 diabetes mellitus with vascular disease ([place]) 09/26/2018   Coronary artery disease of native artery of native heart with stable angina pectoris ([place]) 09/20/2017   Type 2 diabetes mellitus with hyperlipidemia ([place]) 09/16/2008   Hypothyroidism 03/29/2006   Essential hypertension 01/13/2006   PERIPHERAL VASCULAR DISEASE 01/13/2006    Past Medical History:  Diagnosis Date   Acute lower UTI 09/20/2017   Bradycardia on ECG 07/31/2011   Coronary artery disease    hyperlipidemia   Decreased GFR 09/23/2019   Dehydration 09/21/2017   Diabetes mellitus    Diabetes mellitus without complication ([place])    Hypercholesterolemia     Hypertension    Hypothyroidism    Left arm pain 01/29/2020   Paronychia of great toe of left foot 09/26/2018   Paronychia of great toe, left 09/26/2018   PERIPHERAL NEUROPATHY 01/13/2006   Qualifier: Diagnosis of  By: Truett Mainland MD, Christine     Shortness of breath dyspnea    with exertion   Vasovagal syncope    Weakness 09/20/2017    Relevant past medical, surgical, family and social history reviewed and updated as indicated. Interim medical history since our last visit reviewed.  Review of Systems Per HPI unless specifically indicated above     Objective:    BP (!) 148/80    Pulse 76    Ht _0  (1.981 m)    Wt 222 lb (100.7 kg)    SpO2 97%    BMI 25.65 kg/m   Wt Readings from Last 3  Encounters:  02/02/21 222 lb (100.7 kg)  12/31/20 222 lb (100.7 kg)  12/16/20 222 lb 12.8 oz (101.1 kg)    Physical Exam Vitals and nursing note reviewed.  Constitutional:      General: He is not in acute distress.    Appearance: Normal appearance. He is normal weight. He is not toxic-appearing.  HENT:     Head: Normocephalic and atraumatic.     Right Ear: Tympanic membrane, ear canal and external ear normal.     Left Ear: Tympanic membrane, ear canal and external ear normal.     Nose: Congestion and rhinorrhea present.     Mouth/Throat:     Mouth: Mucous membranes are moist.     Pharynx: Oropharynx is clear. No oropharyngeal exudate or posterior oropharyngeal erythema.  Eyes:     General: No scleral icterus.       Right eye: No discharge.        Left eye: No discharge.  Neck:     Vascular: No carotid bruit.  Cardiovascular:     Rate and Rhythm: Normal rate and regular rhythm.     Heart sounds: Normal heart sounds. No murmur heard. Pulmonary:     Effort: Pulmonary effort is normal. No respiratory distress.     Breath sounds: Normal breath sounds. No wheezing, rhonchi or rales.  Abdominal:     General: Abdomen is flat. Bowel sounds are normal.     Palpations: Abdomen is soft.   Musculoskeletal:     Cervical back: Normal range of motion.     Right lower leg: 1+ Edema present.     Left lower leg: 1+ Edema present.  Skin:    General: Skin is warm and dry.     Capillary Refill: Capillary refill takes less than 2 seconds.     Coloration: Skin is not jaundiced or pale.     Findings: No erythema.  Neurological:     Mental Status: He is alert and oriented to person, place, and time.     Motor: No weakness.     Gait: Gait normal.  Psychiatric:        Mood and Affect: Mood normal.        Behavior: Behavior normal.        Thought Content: Thought content normal.        Judgment: Judgment normal.      Assessment & Plan:   Problem List Items Addressed This Visit       Cardiovascular and Mediastinum   Type 2 diabetes mellitus with vascular disease ([place])    Chronic.  Recent A1c was 9.8%.  Goal for this patient is less than 8% given comorbidities and age.  LDL goal less than 70 - recent was 73 and patient was not fasting.  Still not taking insulin consistently and drinking sugary drinks.  I am going to reach out to the pharmacist to see where we are at with getting the patient a continuous glucose meter.  We really need his blood sugar readings to accurately titrate his insulin.  Plan to continue Novolog 6 units daily with meals and Levemir 38 units daily.  Foot exam and eye exam are up to date.  He is on high intensity statin and follows regularly with Cardiology and Podiatry.  Check A1c today.  Encouraged closed follow up with new PCP to titrate insulin and gain better blood sugar control.      Essential hypertension    Chronic.  BP improved upon recheck today.  Goal less than 140/90.  Continue current medications and collaboration with Cardiology.  Check electrolytes and kidney function today.        Coronary artery disease of native artery of native heart with stable angina pectoris (HCC)    Chronic.  Continue collaboration with Vascular Surgeon, Cardiologist, and  continue current medications.       Relevant Orders   BASIC METABOLIC PANEL WITH GFR     Endocrine   Type 2 diabetes mellitus with hyperlipidemia (HCC) - Primary    Chronic.  Recent A1c was 9.8%.  Goal for this patient is less than 8% given comorbidities and age.  LDL goal less than 70 - recent was 73 and patient was not fasting.  Still not taking insulin consistently and drinking sugary drinks.  I am going to reach out to the pharmacist to see where we are at with getting the patient a continuous glucose meter.  We really need his blood sugar readings to accurately titrate his insulin.  Plan to continue Novolog 6 units daily with meals and Levemir 38 units daily.  Foot exam and eye exam are up to date.  He is on high intensity statin and follows regularly with Cardiology and Podiatry.  Check A1c today.  Encouraged closed follow up with new PCP to titrate insulin and gain better blood sugar control.      Relevant Orders   Hemoglobin A1c     Genitourinary   Stage 3a chronic kidney disease (HCC)    Chronic.  Check kidney function today and titrate medication as indicated.        Relevant Orders   Hemoglobin A1c   Microalbumin, urine     Other   Hyperlipidemia    Chronic.  LDL goal less than 70 given history of stent placement and diabetes.  Continue collaboration with Cardiology.      Relevant Orders   BASIC METABOLIC PANEL WITH GFR   Other Visit Diagnoses     Sneezing       Acute.  No sinus tenderness or pressure today.  Suspect allergies - start low dose antihistamine and monitor for improvement.   Relevant Medications   cetirizine (ZYRTEC) 5 MG tablet        Follow up plan: Return for with new PCP.

## 2021-02-03 LAB — BASIC METABOLIC PANEL WITH GFR
BUN/Creatinine Ratio: 11 (calc) (ref 6–22)
BUN: 15 mg/dL (ref 7–25)
CO2: 28 mmol/L (ref 20–32)
Calcium: 10.3 mg/dL (ref 8.6–10.3)
Chloride: 103 mmol/L (ref 98–110)
Creat: 1.34 mg/dL — ABNORMAL HIGH (ref 0.70–1.22)
Glucose, Bld: 312 mg/dL — ABNORMAL HIGH (ref 65–99)
Potassium: 4.6 mmol/L (ref 3.5–5.3)
Sodium: 138 mmol/L (ref 135–146)
eGFR: 53 mL/min/{1.73_m2} — ABNORMAL LOW (ref >=60)

## 2021-02-03 LAB — HEMOGLOBIN A1C
Hgb A1c MFr Bld: 10.5 % of total Hgb — ABNORMAL HIGH (ref <5.7)
Mean Plasma Glucose: 255 mg/dL
eAG (mmol/L): 14.1 mmol/L

## 2021-02-03 LAB — MICROALBUMIN, URINE: Microalb, Ur: 5 mg/dL

## 2021-02-04 ENCOUNTER — Telehealth: Payer: Self-pay

## 2021-02-04 MED ORDER — LISINOPRIL 20 MG PO TABS: 20.0000 mg | ORAL_TABLET | Freq: Every day | ORAL | 3 refills | Status: DC

## 2021-02-04 NOTE — Telephone Encounter (Signed)
-----   Message from [name], NP sent at 02/03/2021  3:53 PM EST ----- Please notify with labs.   - HgbA1c remains elevated and diabetes is not well controlled.  I have spoken with Gerald Stabs our Pharmacist and he has sent in another application for the continuous glucose meter. Will need to increase insulin to get blood sugars under better control.  - Kidney function and electrolytes are stable. - Urine protein level is elevated.  Can increase lisinopril 10 mg to 20 mg daily to help with this and also get blood pressure to better control.

## 2021-02-08 DIAGNOSIS — R0602 Shortness of breath: Secondary | ICD-10-CM | POA: Diagnosis not present

## 2021-02-10 ENCOUNTER — Ambulatory Visit: Payer: PRIVATE HEALTH INSURANCE | Admitting: Podiatry

## 2021-02-10 DIAGNOSIS — R0602 Shortness of breath: Secondary | ICD-10-CM | POA: Diagnosis not present

## 2021-02-11 DIAGNOSIS — I2510 Atherosclerotic heart disease of native coronary artery without angina pectoris: Secondary | ICD-10-CM | POA: Diagnosis not present

## 2021-02-11 DIAGNOSIS — I340 Nonrheumatic mitral (valve) insufficiency: Secondary | ICD-10-CM | POA: Diagnosis not present

## 2021-02-11 DIAGNOSIS — I10 Essential (primary) hypertension: Secondary | ICD-10-CM | POA: Diagnosis not present

## 2021-02-11 DIAGNOSIS — R0602 Shortness of breath: Secondary | ICD-10-CM | POA: Diagnosis not present

## 2021-02-11 DIAGNOSIS — E785 Hyperlipidemia, unspecified: Secondary | ICD-10-CM | POA: Diagnosis not present

## 2021-02-15 DIAGNOSIS — E1159 Type 2 diabetes mellitus with other circulatory complications: Secondary | ICD-10-CM | POA: Diagnosis not present

## 2021-02-24 ENCOUNTER — Telehealth: Payer: Self-pay | Admitting: Pharmacist

## 2021-02-24 NOTE — Progress Notes (Addendum)
Chronic Care Management Pharmacy Assistant   Name: Dylan Robles  MRN: [id number] DOB: [birth year]   Reason for Encounter: Disease State - General Adherence Call     Recent office visits:  02/02/21 Noemi Chapel, NP - Family Medicine- Diabetes - Labs were ordered. cetirizine (ZYRTEC) 5 MG tablet prescribed. Follow up as scheduled.   12/16/20 Noemi Chapel, NP - Family Medicine - Diabetes - Prescription sent for continuous blood glucose monitoring device. Follow up in 1.5 months as scheduled.   Recent consult visits:  None noted.   Hospital visits: 12/31/20 Medication Reconciliation was completed by comparing discharge summary, patients EMR and Pharmacy list, and upon discussion with patient.  Admitted to the hospital on 12/31/20 due to [place]. Discharge date was 01/05/21. Discharged from [place]?Medications Started at [facility] Discharge:?? molnupiravir EUA (LAGEVRIO) 200 mg CAPS capsule  Medication Changes at Hospital Discharge: Note noted  Medications Discontinued at Hospital Discharge: None noted  Medications that remain the same after Hospital Discharge:??  All other medications will remain the same.    Medications: Outpatient Encounter Medications as of 02/24/2021  Medication Sig   amLODipine (NORVASC) 5 MG tablet TAKE 1 TABLET EVERY DAY   aspirin EC 81 MG tablet Take 81 mg by mouth daily.    atorvastatin (LIPITOR) 80 MG tablet TAKE 1 TABLET AT BEDTIME.   Blood Glucose Calibration (TRUE METRIX LEVEL 1) Low SOLN 1 each by In Vitro route as directed.   Blood Glucose Monitoring Suppl (BLOOD GLUCOSE SYSTEM PAK) KIT Please dispense based on patient and insurance preference.  Use as directed to monitor FSBS 4 x daily.  Dx E11.69   BRILINTA 60 MG TABS tablet Take 60 mg by mouth 2 (two) times daily.   cetirizine (ZYRTEC) 5 MG tablet Take 1 tablet (5 mg total) by mouth daily.   diclofenac Sodium (VOLTAREN) 1 % GEL Apply 2 g topically 2 (two) times daily  as needed.   DROPLET INSULIN SYRINGE 30G X 5/16" 1 ML MISC USE AS DIRECTED   ezetimibe (ZETIA) 10 MG tablet Take 10 mg by mouth daily.   furosemide (LASIX) 20 MG tablet Take 20 mg by mouth as needed.   gabapentin (NEURONTIN) 300 MG capsule Take 1 capsule (300 mg total) by mouth 3 (three) times daily.   glipiZIDE (GLUCOTROL) 5 MG tablet TAKE 1/2 TABLET DAILY BEFORE BREAKFAST   glucose blood test strip Please dispense based on patient and insurance preference.  Use as directed to monitor FSBS 4 x daily.  Dx E11.69   insulin aspart (NOVOLOG) 100 UNIT/ML injection Inject 5 Units into the skin 3 (three) times daily before meals.    isosorbide mononitrate (IMDUR) 30 MG 24 hr tablet TAKE 1 TABLET EVERY DAY   ketoconazole (NIZORAL) 2 % cream Apply 1 application topically daily.   ketorolac (ACULAR) 0.5 % ophthalmic solution    LEVEMIR FLEXTOUCH 100 UNIT/ML Pen Inject 34 Units into the skin at bedtime.    levothyroxine (SYNTHROID) 88 MCG tablet TAKE 1 TABLET DAILY BEFORE BREAKFAST.   lisinopril (ZESTRIL) 20 MG tablet Take 1 tablet (20 mg total) by mouth daily.   metFORMIN (GLUCOPHAGE) 1000 MG tablet TAKE 1 TABLET 2 TIMES DAILY WITH A MEAL   metoprolol succinate (TOPROL-XL) 50 MG 24 hr tablet Take 1 tablet (50 mg total) by mouth daily.   ofloxacin (OCUFLOX) 0.3 % ophthalmic solution    tamsulosin (FLOMAX) 0.4 MG CAPS capsule TAKE 1 CAPSULE AT BEDTIME   TRUEplus Lancets  33G MISC USE AS DIRECTED TO MONITOR FINGER STICK BLOOD SUGAR FOUR TIMES DAILY   No facility-administered encounter medications on file as of 02/24/2021.    Have you had any problems recently with your health? Patient reported he is recently getting over [place] but doing much better.   Have you had any problems with your pharmacy? Patient denied having any issues or concerns with his current pharmacy.   What issues or side effects are you having with your medications? Patient denied any issues or side effects with his current  medications.   What would you like me to pass along to Leata Mouse, CPP for them to help you with?  Patient did not have anything to pass along to CPP at this time other than he was doing well and did have any concerns currently.   What can we do to take care of you better? Patient did not have any recommendations at this time.   Care Gaps  AWV: Colonoscopy: unknown DM Eye Exam: due 04/13/21 DM Foot Exam: due 11/04/21 Microalbumin: done 09/22/19 HbgAIC: done 02/02/21 (10.5) DEXA:  N/A Mammogram: N/A  Star Rating Drugs: atorvastatin (LIPITOR) 80 MG tablet - last filled 09/30/20 90 days  glipiZIDE (GLUCOTROL) 5 MG tablet - last filled 12/17/20 90 days  lisinopril (ZESTRIL) 20 MG tablet - last filled 02/04/21 90 days  metFORMIN (GLUCOPHAGE) 1000 MG tablet - last filled 01/19/21 90 days   Future Appointments  Date Time Provider [place]  03/10/2021  1:20 PM Renee Rival, FNP [facility]  03/28/2021  3:00 PM [facility] VASC 3 [facility] None  03/28/2021  4:15 PM Schnier, Dolores Lory, MD [facility] None    Jobe Gibbon, CCMA Clinical Pharmacist Assistant  [phone]  10 minutes spent in review, coordination, and documentation.  Reviewed chart A1c uncontrolled.  Patient should have received Freestyle as it was shipped out on 2/8.  FU with patient next month on how it is going.  Schedule appt with PharmD if he has not established at new practice by then.  Reviewed by: Beverly Milch, PharmD Clinical Pharmacist [phone]

## 2021-02-25 DIAGNOSIS — E1159 Type 2 diabetes mellitus with other circulatory complications: Secondary | ICD-10-CM | POA: Diagnosis not present

## 2021-02-28 ENCOUNTER — Ambulatory Visit (INDEPENDENT_AMBULATORY_CARE_PROVIDER_SITE_OTHER): Payer: Medicare HMO | Admitting: Vascular Surgery

## 2021-02-28 ENCOUNTER — Encounter (INDEPENDENT_AMBULATORY_CARE_PROVIDER_SITE_OTHER): Payer: Medicare HMO

## 2021-03-10 ENCOUNTER — Other Ambulatory Visit: Payer: Self-pay

## 2021-03-10 ENCOUNTER — Encounter: Payer: Self-pay | Admitting: Nurse Practitioner

## 2021-03-10 ENCOUNTER — Ambulatory Visit (INDEPENDENT_AMBULATORY_CARE_PROVIDER_SITE_OTHER): Payer: Medicare HMO | Admitting: Nurse Practitioner

## 2021-03-10 VITALS — BP 129/71 | HR 79 | Ht 78.0 in | Wt 223.0 lb

## 2021-03-10 DIAGNOSIS — N1831 Chronic kidney disease, stage 3a: Secondary | ICD-10-CM

## 2021-03-10 DIAGNOSIS — E039 Hypothyroidism, unspecified: Secondary | ICD-10-CM

## 2021-03-10 DIAGNOSIS — E782 Mixed hyperlipidemia: Secondary | ICD-10-CM

## 2021-03-10 DIAGNOSIS — E1169 Type 2 diabetes mellitus with other specified complication: Secondary | ICD-10-CM

## 2021-03-10 DIAGNOSIS — I10 Essential (primary) hypertension: Secondary | ICD-10-CM | POA: Diagnosis not present

## 2021-03-10 DIAGNOSIS — E785 Hyperlipidemia, unspecified: Secondary | ICD-10-CM

## 2021-03-10 NOTE — Assessment & Plan Note (Signed)
Chronic condition, avoid nephrotoxic agents.  On lisinopril 20 mg daily, CMP plus EGFR at his next visit ?Lab Results  ?Component Value Date  ? NA 138 02/02/2021  ? K 4.6 02/02/2021  ? CO2 28 02/02/2021  ? GLUCOSE 312 (H) 02/02/2021  ? BUN 15 02/02/2021  ? CREATININE 1.34 (H) 02/02/2021  ? CALCIUM 10.3 02/02/2021  ? EGFR 53 (L) 02/02/2021  ? GFRNONAA 45 (L) 12/31/2020  ? ?

## 2021-03-10 NOTE — Patient Instructions (Addendum)
?  Please get your shingles vaccine, COVID booster vaccine, TDAP vaccine at your pharmacy  ? ? ? ?It is important that you exercise regularly at least 30 minutes 5 times a week.  ?Think about what you will eat, plan ahead. ?Choose " clean, green, fresh or frozen" over canned, processed or packaged foods which are more sugary, salty and fatty. ?70 to 75% of food eaten should be vegetables and fruit. ?Three meals at set times with snacks allowed between meals, but they must be fruit or vegetables. ?Aim to eat over a 12 hour period , example 7 am to 7 pm, and STOP after  your last meal of the day. ?Drink water,generally about 64 ounces per day, no other drink is as healthy. Fruit juice is best enjoyed in a healthy way, by EATING the fruit. ? ?Thanks for choosing [facility], we consider it a privelige to serve you. ? ?

## 2021-03-10 NOTE — Assessment & Plan Note (Signed)
Lab Results  ?Component Value Date  ? TSH 1.45 11/03/2020  ? ?Condition well-controlled ?Continue Synthroid 88 mcg daily ?TSH at next visit ?

## 2021-03-10 NOTE — Progress Notes (Signed)
New Patient Office Visit  Subjective:  Patient ID: ISSA KOSMICKI, male    DOB: 11-20-1940  Age: 81 y.o. MRN: [id number]  CC:  Chief Complaint  Patient presents with   New Patient (Initial Visit)    NP    HPI [name] with history of essential hypertension, PVD, CAD, type 2 diabetes with vascular disease, hypothyroidism, stage III chronic kidney disease, hyperlipidemia presents to establish care for his chronic medical conditions.  Previous patient of Noemi Chapel, NP.   HTN, takes amlodipine $RemoveBeforeDE'5mg'eKecJMFvWDhGuWO$  daily, isosorbide mononitrate 30 mg daily lisinopril 20 mg daily metoprolol 50 mg daily.  Patient denies chest pain palpitation dizziness syncope  CAD .  Followed by cardiology, takes Brilinta 60 mg tablets twice daily.  On atorvastatin 80 mg daily, Zetia 10 mg daily, fish oil 1000 mg capsule daily.   DM.  Uncontrolled condition, takes glipizide half tablet daily before breakfast, NovoLog 6 units 3 times daily, metformin 1000 mg twice daily, Levemir 34 units daily.  Patient stated that he was previously seeing Dr. Dorris Fetch did not want to go back to Dr. Seth Bake agreed to see an endocrinologist  Hypothyroidism.  Takes Synthroid 88 mcg daily.   Patient is due for shingles vaccine, Tdap vaccine COVID booster vaccine.  Need for all vaccines discussed with patient he verbalized understanding.  Patient encouraged to get vaccines at his pharmacy  Past Medical History:  Diagnosis Date   Acute lower UTI 09/20/2017   Bradycardia on ECG 07/31/2011   Coronary artery disease    hyperlipidemia   Decreased GFR 09/23/2019   Dehydration 09/21/2017   Diabetes mellitus    Diabetes mellitus without complication ([place])    Hypercholesterolemia    Hypertension    Hypothyroidism    Left arm pain 01/29/2020   Paronychia of great toe of left foot 09/26/2018   Paronychia of great toe, left 09/26/2018   PERIPHERAL NEUROPATHY 01/13/2006   Qualifier: Diagnosis of  By: Truett Mainland MD, Christine     Shortness of breath dyspnea     with exertion   Vasovagal syncope    Weakness 09/20/2017    Past Surgical History:  Procedure Laterality Date   APPENDECTOMY     CARDIAC CATHETERIZATION N/A 06/16/2014   Procedure: Left Heart Cath;  Surgeon: Dionisio David, MD;  Location: [facility];  Service: Cardiovascular;  Laterality: N/A;   CIRCUMCISION, NON-NEWBORN     HERNIA REPAIR     LOWER EXTREMITY ANGIOGRAPHY Left 12/24/2018   Procedure: LOWER EXTREMITY ANGIOGRAPHY;  Surgeon: Katha Cabal, MD;  Location: [facility];  Service: Cardiovascular;  Laterality: Left;   THYROID SURGERY      Family History  Problem Relation Age of Onset   Diabetes Father    Hypertension Father    Cancer Mother    Diabetes Brother     Social History   Socioeconomic History   Marital status: Married    Spouse name: Not on file   Number of children: Not on file   Years of education: Not on file   Highest education level: Not on file  Occupational History   Occupation: Truck Geophysicist/field seismologist    Comment: Retired  Tobacco Use   Smoking status: Former   Smokeless tobacco: Never  Substance and Sexual Activity   Alcohol use: No   Drug use: No   Sexual activity: Not on file  Other Topics Concern   Not on file  Social History Narrative   ** Merged History Encounter **  Social Determinants of Health   Financial Resource Strain: Low Risk    Difficulty of Paying Living Expenses: Not very hard  Food Insecurity: Not on file  Transportation Needs: Not on file  Physical Activity: Not on file  Stress: Not on file  Social Connections: Not on file  Intimate Partner Violence: Not on file    ROS Review of Systems  Constitutional: Negative.   Respiratory: Negative.    Cardiovascular: Negative.   Endocrine: Negative.   Genitourinary: Negative.   Psychiatric/Behavioral: Negative.     Objective:   Today's Vitals: BP 129/71 (BP Location: Right Arm, Patient Position: Sitting, Cuff Size: Large)    Pulse 79    Ht $R'6\' 6"'CW$   (1.981 m)    Wt 223 lb (101.2 kg)    SpO2 98%    BMI 25.77 kg/m   Physical Exam Constitutional:      General: He is not in acute distress.    Appearance: He is not ill-appearing, toxic-appearing or diaphoretic.  Cardiovascular:     Rate and Rhythm: Normal rate and regular rhythm.     Pulses: Normal pulses.     Heart sounds: Normal heart sounds. No murmur heard.   No friction rub. No gallop.  Pulmonary:     Effort: Pulmonary effort is normal. No respiratory distress.     Breath sounds: Normal breath sounds. No stridor. No wheezing, rhonchi or rales.  Chest:     Chest wall: No tenderness.  Abdominal:     Palpations: Abdomen is soft.  Neurological:     Mental Status: He is alert and oriented to person, place, and time.  Psychiatric:        Mood and Affect: Mood normal.        Behavior: Behavior normal.        Thought Content: Thought content normal.        Judgment: Judgment normal.    Assessment & Plan:   Problem List Items Addressed This Visit       Cardiovascular and Mediastinum   Essential hypertension    BP Readings from Last 3 Encounters:  03/10/21 129/71  02/02/21 (!) 148/80  12/31/20 (!) 142/81  Condition well-controlled akes amlodipine $RemoveBeforeD'5mg'zXLYlOdyfkQFJN$  daily, isosorbide mononitrate 30 mg daily lisinopril 20 mg daily metoprolol 50 mg daily.  Patient denies chest pain palpitation dizziness syncope Continue current medications. Check CMP plus EGFR at next visit. Lab Results  Component Value Date   NA 138 02/02/2021   K 4.6 02/02/2021   CO2 28 02/02/2021   GLUCOSE 312 (H) 02/02/2021   BUN 15 02/02/2021   CREATININE 1.34 (H) 02/02/2021   CALCIUM 10.3 02/02/2021   EGFR 53 (L) 02/02/2021   GFRNONAA 45 (L) 12/31/2020         Endocrine   Hypothyroidism    Lab Results  Component Value Date   TSH 1.45 11/03/2020  Condition well-controlled Continue Synthroid 88 mcg daily TSH at next visit      Type 2 diabetes mellitus with hyperlipidemia (HCC) - Primary    Lab Results   Component Value Date   HGBA1C 10.5 (H) 02/02/2021  Uncontrolled condition, patient states that he has been drinking sugary drinks   takes glipizide half tablet daily before breakfast, NovoLog 6 units 3 times daily, metformin 1000 mg twice daily, Levemir 34 units daily.  Patient stated that he was previously seeing Dr. Dorris Fetch did not want to go back to Dr. Dorris Fetch. pt agreed to see another endocrinologist Patient is on high intensity statin  Up-to-date with foot exam and eye exam. Need to avoid sugar, soda, cake discussed with patient and his house they verbalized understanding. Referred to endocrinology today Follow-up in 3 months      Relevant Orders   Ambulatory referral to Endocrinology     Genitourinary   Stage 3a chronic kidney disease ([place])    Chronic condition, avoid nephrotoxic agents.  On lisinopril 20 mg daily, CMP plus EGFR at his next visit Lab Results  Component Value Date   NA 138 02/02/2021   K 4.6 02/02/2021   CO2 28 02/02/2021   GLUCOSE 312 (H) 02/02/2021   BUN 15 02/02/2021   CREATININE 1.34 (H) 02/02/2021   CALCIUM 10.3 02/02/2021   EGFR 53 (L) 02/02/2021   GFRNONAA 45 (L) 12/31/2020          Other   Hyperlipidemia    Lab Results  Component Value Date   LDLCALC 73 11/03/2020  Takes atorvastatin 80 mg daily, Zetia 10 mg daily, fish oil 1000 mg capsule daily Check lipid panel at next visit Goal is LDL less than 55 Has CAD, patient encouraged to keep close follow-up with cardiology, they verbalized understanding        Outpatient Encounter Medications as of 03/10/2021  Medication Sig   amLODipine (NORVASC) 5 MG tablet TAKE 1 TABLET EVERY DAY   Ascorbic Acid (VITAMIN C) 500 MG CAPS Take by mouth once. daily   aspirin EC 81 MG tablet Take 81 mg by mouth daily.    atorvastatin (LIPITOR) 80 MG tablet TAKE 1 TABLET AT BEDTIME.   Blood Glucose Calibration (TRUE METRIX LEVEL 1) Low SOLN 1 each by In Vitro route as directed.   Blood Glucose Monitoring Suppl  (BLOOD GLUCOSE SYSTEM PAK) KIT Please dispense based on patient and insurance preference.  Use as directed to monitor FSBS 4 x daily.  Dx E11.69   BRILINTA 60 MG TABS tablet Take 60 mg by mouth 2 (two) times daily.   DROPLET INSULIN SYRINGE 30G X 5/16" 1 ML MISC USE AS DIRECTED   ezetimibe (ZETIA) 10 MG tablet Take 10 mg by mouth daily.   Fish Oil-Vitamin D 1000-1000 MG-UNIT CAPS Take by mouth once. daily   furosemide (LASIX) 20 MG tablet Take 20 mg by mouth as needed.   gabapentin (NEURONTIN) 300 MG capsule Take 1 capsule (300 mg total) by mouth 3 (three) times daily.   glipiZIDE (GLUCOTROL) 5 MG tablet TAKE 1/2 TABLET DAILY BEFORE BREAKFAST   glucose blood test strip Please dispense based on patient and insurance preference.  Use as directed to monitor FSBS 4 x daily.  Dx E11.69   insulin aspart (NOVOLOG) 100 UNIT/ML injection Inject 5 Units into the skin 3 (three) times daily before meals.    isosorbide mononitrate (IMDUR) 30 MG 24 hr tablet TAKE 1 TABLET EVERY DAY   LEVEMIR FLEXTOUCH 100 UNIT/ML Pen Inject 34 Units into the skin at bedtime.    levothyroxine (SYNTHROID) 88 MCG tablet TAKE 1 TABLET DAILY BEFORE BREAKFAST.   lisinopril (ZESTRIL) 20 MG tablet Take 1 tablet (20 mg total) by mouth daily.   metFORMIN (GLUCOPHAGE) 1000 MG tablet TAKE 1 TABLET 2 TIMES DAILY WITH A MEAL   metoprolol succinate (TOPROL-XL) 50 MG 24 hr tablet Take 1 tablet (50 mg total) by mouth daily.   tamsulosin (FLOMAX) 0.4 MG CAPS capsule TAKE 1 CAPSULE AT BEDTIME   TRUEplus Lancets 33G MISC USE AS DIRECTED TO MONITOR FINGER STICK BLOOD SUGAR FOUR TIMES DAILY   cetirizine (ZYRTEC)  5 MG tablet Take 1 tablet (5 mg total) by mouth daily. (Patient not taking: Reported on 03/10/2021)   diclofenac Sodium (VOLTAREN) 1 % GEL Apply 2 g topically 2 (two) times daily as needed. (Patient not taking: Reported on 03/10/2021)   ketoconazole (NIZORAL) 2 % cream Apply 1 application topically daily. (Patient not taking: Reported on  03/10/2021)   ketorolac (ACULAR) 0.5 % ophthalmic solution  (Patient not taking: Reported on 03/10/2021)   ofloxacin (OCUFLOX) 0.3 % ophthalmic solution  (Patient not taking: Reported on 03/10/2021)   No facility-administered encounter medications on file as of 03/10/2021.    Follow-up: Return in about 3 months (around 06/10/2021).   Renee Rival, FNP

## 2021-03-10 NOTE — Assessment & Plan Note (Signed)
Lab Results  ?Component Value Date  ? HGBA1C 10.5 (H) 02/02/2021  ? ?Uncontrolled condition, patient states that he has been drinking sugary drinks ?  takes glipizide half tablet daily before breakfast, NovoLog 6 units 3 times daily, metformin 1000 mg twice daily, Levemir 34 units daily.  Patient stated that he was previously seeing Dr. Dorris Fetch did not want to go back to Dr. Dorris Fetch. pt agreed to see another endocrinologist ?Patient is on high intensity statin ?Up-to-date with foot exam and eye exam. ?Need to avoid sugar, soda, cake discussed with patient and his house they verbalized understanding. ?Referred to endocrinology today ?Follow-up in 3 months ?

## 2021-03-10 NOTE — Assessment & Plan Note (Signed)
Lab Results  ?Component Value Date  ? Wagner 73 11/03/2020  ?Takes atorvastatin 80 mg daily, Zetia 10 mg daily, fish oil 1000 mg capsule daily ?Check lipid panel at next visit ?Goal is LDL less than 55 ?Has CAD, patient encouraged to keep close follow-up with cardiology, they verbalized understanding ? ?

## 2021-03-10 NOTE — Assessment & Plan Note (Signed)
BP Readings from Last 3 Encounters:  ?03/10/21 129/71  ?02/02/21 (!) 148/80  ?12/31/20 (!) 142/81  ?Condition well-controlled ?akes amlodipine 5mg daily, isosorbide mononitrate 30 mg daily lisinopril 20 mg daily metoprolol 50 mg daily.  Patient denies chest pain palpitation dizziness syncope ?Continue current medications. ?Check CMP plus EGFR at next visit. ?Lab Results  ?Component Value Date  ? NA 138 02/02/2021  ? K 4.6 02/02/2021  ? CO2 28 02/02/2021  ? GLUCOSE 312 (H) 02/02/2021  ? BUN 15 02/02/2021  ? CREATININE 1.34 (H) 02/02/2021  ? CALCIUM 10.3 02/02/2021  ? EGFR 53 (L) 02/02/2021  ? GFRNONAA 45 (L) 12/31/2020  ? ?

## 2021-03-25 ENCOUNTER — Other Ambulatory Visit (INDEPENDENT_AMBULATORY_CARE_PROVIDER_SITE_OTHER): Payer: Self-pay | Admitting: Vascular Surgery

## 2021-03-25 DIAGNOSIS — I739 Peripheral vascular disease, unspecified: Secondary | ICD-10-CM

## 2021-03-28 ENCOUNTER — Other Ambulatory Visit: Payer: Self-pay

## 2021-03-28 ENCOUNTER — Other Ambulatory Visit: Payer: Self-pay | Admitting: Nurse Practitioner

## 2021-03-28 ENCOUNTER — Ambulatory Visit (INDEPENDENT_AMBULATORY_CARE_PROVIDER_SITE_OTHER): Payer: PRIVATE HEALTH INSURANCE | Admitting: Vascular Surgery

## 2021-03-28 ENCOUNTER — Encounter (INDEPENDENT_AMBULATORY_CARE_PROVIDER_SITE_OTHER): Payer: Self-pay | Admitting: Vascular Surgery

## 2021-03-28 ENCOUNTER — Ambulatory Visit (INDEPENDENT_AMBULATORY_CARE_PROVIDER_SITE_OTHER): Payer: PRIVATE HEALTH INSURANCE

## 2021-03-28 VITALS — BP 116/66 | HR 82 | Resp 17 | Ht 78.0 in | Wt 222.0 lb

## 2021-03-28 DIAGNOSIS — I25118 Atherosclerotic heart disease of native coronary artery with other forms of angina pectoris: Secondary | ICD-10-CM | POA: Diagnosis not present

## 2021-03-28 DIAGNOSIS — E782 Mixed hyperlipidemia: Secondary | ICD-10-CM | POA: Diagnosis not present

## 2021-03-28 DIAGNOSIS — I10 Essential (primary) hypertension: Secondary | ICD-10-CM | POA: Diagnosis not present

## 2021-03-28 DIAGNOSIS — E1159 Type 2 diabetes mellitus with other circulatory complications: Secondary | ICD-10-CM | POA: Diagnosis not present

## 2021-03-28 DIAGNOSIS — I739 Peripheral vascular disease, unspecified: Secondary | ICD-10-CM

## 2021-03-28 DIAGNOSIS — I70213 Atherosclerosis of native arteries of extremities with intermittent claudication, bilateral legs: Secondary | ICD-10-CM | POA: Diagnosis not present

## 2021-03-28 DIAGNOSIS — E1169 Type 2 diabetes mellitus with other specified complication: Secondary | ICD-10-CM

## 2021-03-29 ENCOUNTER — Ambulatory Visit (INDEPENDENT_AMBULATORY_CARE_PROVIDER_SITE_OTHER): Payer: Medicare HMO | Admitting: Family Medicine

## 2021-03-29 DIAGNOSIS — E119 Type 2 diabetes mellitus without complications: Secondary | ICD-10-CM

## 2021-03-29 DIAGNOSIS — Z0000 Encounter for general adult medical examination without abnormal findings: Secondary | ICD-10-CM

## 2021-03-29 DIAGNOSIS — Z0001 Encounter for general adult medical examination with abnormal findings: Secondary | ICD-10-CM | POA: Diagnosis not present

## 2021-03-29 NOTE — Patient Instructions (Signed)
?  Dylan Robles , ?Thank you for taking time to come for your Medicare Wellness Visit. I appreciate your ongoing commitment to your health goals. Please review the following plan we discussed and let me know if I can assist you in the future.  ? ?These are the goals we discussed: ? Goals   ? ?  Monitor and Manage My Blood Sugar-Diabetes Type 2   ?  Timeframe:  Long-Range Goal ?Priority:  High ?Start Date:  06/04/20                           ?Expected End Date: 12/05/20                     ? ?Follow Up Date 08/08/20 ?  ?- check blood sugar at prescribed times ?- check blood sugar if I feel it is too high or too low ?- enter blood sugar readings and medication or insulin into daily log ?- take the blood sugar log to all doctor visits  ?  ?Why is this important?   ?Checking your blood sugar at home helps to keep it from getting very high or very low.  ?Writing the results in a diary or log helps the doctor know how to care for you.  ?Your blood sugar log should have the time, date and the results.  ?Also, write down the amount of insulin or other medicine that you take.  ?Other information, like what you ate, exercise done and how you were feeling, will also be helpful.   ?  ?Notes:  ?  ? ?  ?  ?This is a list of the screening recommended for you and due dates:  ?Health Maintenance  ?Topic Date Due  ? Zoster (Shingles) Vaccine (1 of 2) Never done  ? COVID-19 Vaccine (4 - Booster for Moderna series) 04/08/2021*  ? Tetanus Vaccine  04/29/2021*  ? Eye exam for diabetics  04/13/2021  ? Hemoglobin A1C  05/03/2021  ? Complete foot exam   11/04/2021  ? Pneumonia Vaccine  Completed  ? Flu Shot  Completed  ? HPV Vaccine  Aged Out  ?*Topic was postponed. The date shown is not the original due date.  ?  ?

## 2021-03-29 NOTE — Progress Notes (Signed)
? ?Subjective:  ? Dylan Robles is a 81 y.o. male who presents for an Initial Medicare Annual Wellness Visit. ? ?Review of Systems    ?I connected with  Dylan Robles on 03/29/21 by a audio enabled telemedicine application and verified that I am speaking with the correct person using two identifiers. ? ?Patient Location: Home ? ?Provider Location: Office/Clinic ? ?I discussed the limitations of evaluation and management by telemedicine. The patient expressed understanding and agreed to proceed.  ?Cardiac Risk Factors include: advanced age (>62mn, >>21women);hypertension;male gender;diabetes mellitus ? ?   ?Objective:  ?  ?There were no vitals filed for this visit. ?There is no height or weight on file to calculate BMI. ? ?Advanced Directives 03/29/2021 12/24/2018 09/20/2017 09/20/2017 09/18/2017 09/12/2017 04/28/2015  ?Does Patient Have a Medical Advance Directive? No No Yes No No No No  ?Type of Advance Directive - -Public librarianLiving will - - - -  ?Does patient want to make changes to medical advance directive? - - No - Patient declined - - - -  ?Would patient like information on creating a medical advance directive? No - Patient declined Yes (MAU/Ambulatory/Procedural Areas - Information given) No - Patient declined - No - Patient declined No - Patient declined No - patient declined information  ?Pre-existing out of facility DNR order (yellow form or pink MOST form) - - - - - - -  ? ? ?Current Medications (verified) ?Outpatient Encounter Medications as of 03/29/2021  ?Medication Sig  ? amLODipine (NORVASC) 5 MG tablet TAKE 1 TABLET EVERY DAY  ? Ascorbic Acid (VITAMIN C) 500 MG CAPS Take by mouth once. daily  ? aspirin EC 81 MG tablet Take 81 mg by mouth daily.   ? atorvastatin (LIPITOR) 80 MG tablet TAKE 1 TABLET AT BEDTIME.  ? Blood Glucose Calibration (TRUE METRIX LEVEL 1) Low SOLN 1 each by In Vitro route as directed.  ? Blood Glucose Monitoring Suppl (BLOOD GLUCOSE SYSTEM PAK) KIT Please dispense  based on patient and insurance preference.  Use as directed to monitor FSBS 4 x daily.  Dx E11.69  ? BRILINTA 60 MG TABS tablet Take 60 mg by mouth 2 (two) times daily.  ? cetirizine (ZYRTEC) 5 MG tablet Take 1 tablet (5 mg total) by mouth daily.  ? diclofenac Sodium (VOLTAREN) 1 % GEL Apply 2 g topically 2 (two) times daily as needed.  ? DROPLET INSULIN SYRINGE 30G X 5/16" 1 ML MISC USE AS DIRECTED  ? ezetimibe (ZETIA) 10 MG tablet Take 10 mg by mouth daily.  ? Fish Oil-Vitamin D 1000-1000 MG-UNIT CAPS Take by mouth once. daily  ? furosemide (LASIX) 20 MG tablet Take 20 mg by mouth as needed.  ? gabapentin (NEURONTIN) 300 MG capsule Take 1 capsule (300 mg total) by mouth 3 (three) times daily.  ? glipiZIDE (GLUCOTROL) 5 MG tablet TAKE 1/2 TABLET DAILY BEFORE BREAKFAST  ? glucose blood test strip Please dispense based on patient and insurance preference.  Use as directed to monitor FSBS 4 x daily.  Dx E11.69  ? insulin aspart (NOVOLOG) 100 UNIT/ML injection Inject 5 Units into the skin 3 (three) times daily before meals.   ? isosorbide mononitrate (IMDUR) 30 MG 24 hr tablet TAKE 1 TABLET EVERY DAY  ? ketoconazole (NIZORAL) 2 % cream Apply 1 application topically daily. (Patient not taking: Reported on 03/10/2021)  ? ketorolac (ACULAR) 0.5 % ophthalmic solution  (Patient not taking: Reported on 03/10/2021)  ? LEVEMIR FLEXTOUCH 100 UNIT/ML  Pen Inject 34 Units into the skin at bedtime.   ? levothyroxine (SYNTHROID) 88 MCG tablet TAKE 1 TABLET DAILY BEFORE BREAKFAST.  ? lisinopril (ZESTRIL) 20 MG tablet Take 1 tablet (20 mg total) by mouth daily.  ? metFORMIN (GLUCOPHAGE) 1000 MG tablet TAKE 1 TABLET 2 TIMES DAILY WITH A MEAL  ? metoprolol succinate (TOPROL-XL) 50 MG 24 hr tablet Take 1 tablet (50 mg total) by mouth daily.  ? ofloxacin (OCUFLOX) 0.3 % ophthalmic solution  (Patient not taking: Reported on 03/10/2021)  ? tamsulosin (FLOMAX) 0.4 MG CAPS capsule TAKE 1 CAPSULE AT BEDTIME  ? TRUEplus Lancets 33G MISC USE AS  DIRECTED TO MONITOR FINGER STICK BLOOD SUGAR FOUR TIMES DAILY  ? ?No facility-administered encounter medications on file as of 03/29/2021.  ? ? ?Allergies (verified) ?Plavix [clopidogrel]  ? ?History: ?Past Medical History:  ?Diagnosis Date  ? Acute lower UTI 09/20/2017  ? Bradycardia on ECG 07/31/2011  ? Coronary artery disease   ? hyperlipidemia  ? Decreased GFR 09/23/2019  ? Dehydration 09/21/2017  ? Diabetes mellitus   ? Diabetes mellitus without complication ([place])   ? Hypercholesterolemia   ? Hypertension   ? Hypothyroidism   ? Left arm pain 01/29/2020  ? Paronychia of great toe of left foot 09/26/2018  ? Paronychia of great toe, left 09/26/2018  ? PERIPHERAL NEUROPATHY 01/13/2006  ? Qualifier: Diagnosis of  By: Truett Mainland MD, Christine    ? Shortness of breath dyspnea   ? with exertion  ? Vasovagal syncope   ? Weakness 09/20/2017  ? ?Past Surgical History:  ?Procedure Laterality Date  ? APPENDECTOMY    ? CARDIAC CATHETERIZATION N/A 06/16/2014  ? Procedure: Left Heart Cath;  Surgeon: Dionisio David, MD;  Location: [facility];  Service: Cardiovascular;  Laterality: N/A;  ? CIRCUMCISION, NON-NEWBORN    ? HERNIA REPAIR    ? LOWER EXTREMITY ANGIOGRAPHY Left 12/24/2018  ? Procedure: LOWER EXTREMITY ANGIOGRAPHY;  Surgeon: Katha Cabal, MD;  Location: [facility];  Service: Cardiovascular;  Laterality: Left;  ? THYROID SURGERY    ? ?Family History  ?Problem Relation Age of Onset  ? Diabetes Father   ? Hypertension Father   ? Cancer Mother   ? Diabetes Brother   ? ?Social History  ? ?Socioeconomic History  ? Marital status: Married  ?  Spouse name: Not on file  ? Number of children: Not on file  ? Years of education: Not on file  ? Highest education level: Not on file  ?Occupational History  ? 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 Smoking status: Former  ? Smokeless tobacco: Never  ?Substance and Sexual Activity  ? Alcohol use: No  ? Drug use: No  ? Sexual activity: Not on file  ?Other  Topics Concern  ? Not on file  ?Social History Narrative  ? ** Merged History Encounter **  ?    ? ?Social Determinants of Health  ? ?Financial Resource Strain: Low Risk   ? Difficulty of Paying Living Expenses: Not very hard  ?Food Insecurity: No Food Insecurity  ? Worried About Charity fundraiser in the Last Year: Never true  ? Ran Out of Food in the Last Year: Never true  ?Transportation Needs: No Transportation Needs  ? Lack of Transportation (Medical): No  ? Lack of Transportation (Non-Medical): No  ?Physical Activity: Insufficiently Active  ? Days of Exercise per Week: 7 days  ? Minutes of Exercise  per Session: 20 min  ?Stress: No Stress Concern Present  ? Feeling of Stress : Not at all  ?Social Connections: Socially Integrated  ? Frequency of Communication with Friends and Family: More than three times a week  ? Frequency of Social Gatherings with Friends and Family: More than three times a week  ? Attends Religious Services: More than 4 times per year  ? Active Member of Clubs or Organizations: Yes  ? Attends Archivist Meetings: 1 to 4 times per year  ? Marital Status: Married  ? ? ?Tobacco Counseling ?Counseling given: Not Answered ? ? ?Clinical Intake: ? ?Pre-visit preparation completed: No ? ?  ? ?  ? ?Nutritional Status: BMI 25 -29 Overweight ?Nutritional Risks: None ?Diabetes: Yes ?CBG done?: No ?Did pt. bring in CBG monitor from home?: No ? ?How often do you need to have someone help you when you read instructions, pamphlets, or other written materials from your doctor or pharmacy?: 5 - Always ?What is the last grade level you completed in school?: 12th ? ?Diabetic? Yes ?Nutrition Risk Assessment: ? ?Has the patient had any N/V/D within the last 2 months?  No  ?Does the patient have any non-healing wounds?  No  ?Has the patient had any unintentional weight loss or weight gain?  No  ? ?Diabetes: ? ?Is the patient diabetic?  Yes  ?If diabetic, was a CBG obtained today?  No  ?Did the patient  bring in their glucometer from home?  No  ?How often do you monitor your CBG's?  Four times a day ? ?Financial Strains and Diabetes Management: ? ?Are you having any financial strains with the device, your sup

## 2021-04-04 ENCOUNTER — Other Ambulatory Visit: Payer: Self-pay | Admitting: Nurse Practitioner

## 2021-04-04 DIAGNOSIS — E1169 Type 2 diabetes mellitus with other specified complication: Secondary | ICD-10-CM

## 2021-04-06 ENCOUNTER — Other Ambulatory Visit: Payer: Self-pay | Admitting: Nurse Practitioner

## 2021-04-06 DIAGNOSIS — E1169 Type 2 diabetes mellitus with other specified complication: Secondary | ICD-10-CM

## 2021-04-12 ENCOUNTER — Encounter: Payer: Medicare HMO | Attending: Nurse Practitioner | Admitting: Nutrition

## 2021-04-12 ENCOUNTER — Encounter (INDEPENDENT_AMBULATORY_CARE_PROVIDER_SITE_OTHER): Payer: Self-pay | Admitting: Vascular Surgery

## 2021-04-12 VITALS — Ht 78.0 in | Wt 222.6 lb

## 2021-04-12 DIAGNOSIS — E1169 Type 2 diabetes mellitus with other specified complication: Secondary | ICD-10-CM

## 2021-04-12 DIAGNOSIS — E119 Type 2 diabetes mellitus without complications: Secondary | ICD-10-CM | POA: Diagnosis not present

## 2021-04-12 DIAGNOSIS — N1831 Chronic kidney disease, stage 3a: Secondary | ICD-10-CM

## 2021-04-12 DIAGNOSIS — I25118 Atherosclerotic heart disease of native coronary artery with other forms of angina pectoris: Secondary | ICD-10-CM

## 2021-04-12 DIAGNOSIS — I10 Essential (primary) hypertension: Secondary | ICD-10-CM

## 2021-04-12 DIAGNOSIS — E0842 Diabetes mellitus due to underlying condition with diabetic polyneuropathy: Secondary | ICD-10-CM

## 2021-04-12 NOTE — Progress Notes (Signed)
Medical Nutrition Therapy  ?Appointment Start time:  1400  Appointment End time:  1500 ? ?Primary concerns today: Dm Type 2,   ?Referral diagnosis: E11.8 ?Preferred learning style: No preference ?Learning readiness: Ready  ? ? ?NUTRITION ASSESSMENT  ?Here with is wife. Sees [facility] for PCP. ?DM x 10 yrs.  He has the [place].  ?FBS 69-130 mg/dl  Before lunch 90- 130's Before dinner: 100-130's. Bedtime: 241. ?Anthropometrics  ?Wt Readings from Last 3 Encounters:  ?04/12/21 222 lb 9.6 oz (101 kg)  ?03/28/21 222 lb (100.7 kg)  ?03/10/21 223 lb (101.2 kg)  ? ?Ht Readings from Last 3 Encounters:  ?04/12/21 '6\' 6"'$  (1.981 m)  ?03/28/21 '6\' 6"'$  (1.981 m)  ?03/10/21 '6\' 6"'$  (1.981 m)  ? ?Body mass index is 25.72 kg/m?. ?'@BMIFA'$ @ ?Facility age limit for growth percentiles is 20 years. ?Facility age limit for growth percentiles is 20 years. ?  ? ?Clinical ?Medical Hx: see chart ?Medications: Levermir 34 units at night, Novolog 5 units plus sliding scale., Glipizide 2.5 mg before breakfast ?Labs:  ?Lab Results  ?Component Value Date  ? HGBA1C 10.5 (H) 02/02/2021  ? ? ?  Latest Ref Rng & Units 02/02/2021  ?  8:39 AM 12/31/2020  ?  7:30 PM 11/03/2020  ? 10:32 AM  ?CMP  ?Glucose 65 - 99 mg/dL 312   333   111    ?BUN 7 - 25 mg/dL '15   20   19    '$ ?Creatinine 0.70 - 1.22 mg/dL 1.34   1.52   1.28    ?Sodium 135 - 146 mmol/L 138   130   142    ?Potassium 3.5 - 5.3 mmol/L 4.6   4.3   4.4    ?Chloride 98 - 110 mmol/L 103   100   106    ?CO2 20 - 32 mmol/L '28   21   22    '$ ?Calcium 8.6 - 10.3 mg/dL 10.3   8.6   10.0    ?Total Protein 6.5 - 8.1 g/dL  6.3   6.5    ?Total Bilirubin 0.3 - 1.2 mg/dL  0.4   1.0    ?Alkaline Phos 38 - 126 U/L  76     ?AST 15 - 41 U/L  15   13    ?ALT 0 - 44 U/L  12   9    ? ?Lipid Panel  ?   ?Component Value Date/Time  ? CHOL 129 11/03/2020 1032  ? TRIG 82 11/03/2020 1032  ? HDL 40 11/03/2020 1032  ? CHOLHDL 3.2 11/03/2020 1032  ? VLDL 12 06/17/2008 0304  ? [place] 73 11/03/2020 1032  ? ? ?Notable  Signs/Symptoms: tired, increased urination ? ?Lifestyle & Dietary Hx ?Lives with his wife. He is use to going out to at breakfast and may eat 3 meals per days. Sometimes eats late at night. ? ?Estimated daily fluid intake: 60 oz ?Supplements:  ?Sleep: 8-10 hrs ?Stress / self-care: no ?Current average no ?weekly physical activity: Walks and yard work  ? ?24-Hr Dietary Recall ?First Meal: coffee, sausage biscuit, water ?Snack:  ?Second Meal: Pinto beans, bread, pickle, water ?Snack:  ?Third Meal: Chicken,   french frieds -few, water,  ? ?Snack: Bologna sandwich,  ?Beverages: water ? ?Estimated Energy Needs ?Calories: 1800 ?Carbohydrate: 200g ?Protein: 135 g ?Fat: 50g ? ? ?NUTRITION DIAGNOSIS  ?NB-1.1 Food and nutrition-related knowledge deficit As related to Diabetes Type 2.  As evidenced by A1C 8.8%. ? ? ?NUTRITION INTERVENTION  ?  Nutrition education (E-1) on the following topics:  ?Nutrition and Diabetes education provided on My Plate, CHO counting, meal planning, portion sizes, timing of meals, avoiding snacks between meals unless having a low blood sugar, target ranges for A1C and blood sugars, signs/symptoms and treatment of hyper/hypoglycemia, monitoring blood sugars, taking medications as prescribed, benefits of exercising 30 minutes per day and prevention of complications of DM. ? ?Lifestyle Medicine ?- Whole Food, Plant Predominant Nutrition is highly recommended: Eat Plenty of vegetables, Mushrooms, fruits, Legumes, Whole Grains, Nuts, seeds in lieu of processed meats, processed snacks/pastries red meat, poultry, eggs.  ?  ?-It is better to avoid simple carbohydrates including: Cakes, Sweet Desserts, Ice Cream, Soda (diet and regular), Sweet Tea, Candies, Chips, Cookies, Store Bought Juices, Alcohol in Excess of  1-2 drinks a day, Lemonade,  Artificial Sweeteners, Doughnuts, Coffee Creamers, "Sugar-free" Products, etc, etc.  This is not a complete list..... ? ?Exercise: If you are able: 30 -60 minutes a day  ,4 days a week, or 150 minutes a week.  The longer the better.  Combine stretch, strength, and aerobic activities.  If you were told in the past that you have high risk for cardiovascular diseases, you may seek evaluation by your heart doctor prior to initiating moderate to intense exercise programs ? ?Handouts Provided Include  ?LIfestyle medicine ?Meal planning with plant based food ?Meal Plan Card ? ?Learning Style & Readiness for Change ?Teaching method utilized: Visual & Auditory  ?Demonstrated degree of understanding via: Teach Back  ?Barriers to learning/adherence to lifestyle change: none ? ?Goals Established by Pt ?Goals ? ?Eat three meals per day ?Increase plant based food ?Cut out processed meats, and junk food ?Walk 15 minutes per day ?Call MD if BS are less than 90 2-times in a row. ?Get A1C down to 7% or below. ? ? ?MONITORING & EVALUATION ?Dietary intake, weekly physical activity, and blood sugars in 1 month. ? ?Next Steps  ?Patient is to work on eating more plant based foods and cut out fried and processed foods.. ? ?

## 2021-04-12 NOTE — Patient Instructions (Addendum)
Goals ? ?Eat three meals per day ?Increase plant based food ?Cut out processed meats, and junk food ?Walk 15 minutes per day ?Call MD if BS are less than 90 2-times in a row. ?Get A1C down to 7% or below. ? ? ?

## 2021-04-12 NOTE — Progress Notes (Signed)
? ? ?MRN : [id number] ? ?Dylan Robles is a 81 y.o. (1940/09/22) male who presents with chief complaint of check legs. ? ?History of Present Illness:  ? ?Patient is here for follow-up and review of noninvasive studies.  He is s/p past angiogram on 12/24/2018 due to lower extremity ulceration.  His wound has healed. ? ?There have been no interval changes in lower extremity symptoms. No interval shortening of the patient's claudication distance or development of rest pain symptoms. No new ulcers or wounds have occurred since the last visit. ? ?There have been no significant changes to the patient's overall health care. ? ?The patient denies amaurosis fugax or recent TIA symptoms. There are no recent neurological changes noted. ?The patient denies history of DVT, PE or superficial thrombophlebitis. ?The patient denies recent episodes of angina or shortness of breath.  ? ?ABI's Rt=0.73 and Lt=0.77  (previous ABI Rt=0.85 and Lt=0.97) ?  ?Previous left lower extremity arterial duplex reveals biphasic/triphasic waveforms down to the level of the tibial arteries (stent is patent) where it transitions to monophasic flow throughout the calf vessels, essentially unchanged compared to last study. ? ?Current Meds  ?Medication Sig  ? amLODipine (NORVASC) 5 MG tablet TAKE 1 TABLET EVERY DAY  ? Ascorbic Acid (VITAMIN C) 500 MG CAPS Take by mouth once. daily  ? aspirin EC 81 MG tablet Take 81 mg by mouth daily.   ? atorvastatin (LIPITOR) 80 MG tablet TAKE 1 TABLET AT BEDTIME.  ? Blood Glucose Calibration (TRUE METRIX LEVEL 1) Low SOLN 1 each by In Vitro route as directed.  ? Blood Glucose Monitoring Suppl (BLOOD GLUCOSE SYSTEM PAK) KIT Please dispense based on patient and insurance preference.  Use as directed to monitor FSBS 4 x daily.  Dx E11.69  ? BRILINTA 60 MG TABS tablet Take 60 mg by mouth 2 (two) times daily.  ? cetirizine (ZYRTEC) 5 MG tablet Take 1 tablet (5 mg total) by mouth daily.  ? diclofenac Sodium (VOLTAREN) 1 % GEL  Apply 2 g topically 2 (two) times daily as needed.  ? DROPLET INSULIN SYRINGE 30G X 5/16" 1 ML MISC USE AS DIRECTED  ? ezetimibe (ZETIA) 10 MG tablet Take 10 mg by mouth daily.  ? Fish Oil-Vitamin D 1000-1000 MG-UNIT CAPS Take by mouth once. daily  ? furosemide (LASIX) 20 MG tablet Take 20 mg by mouth as needed.  ? gabapentin (NEURONTIN) 300 MG capsule Take 1 capsule (300 mg total) by mouth 3 (three) times daily.  ? glipiZIDE (GLUCOTROL) 5 MG tablet TAKE 1/2 TABLET DAILY BEFORE BREAKFAST  ? glucose blood test strip Please dispense based on patient and insurance preference.  Use as directed to monitor FSBS 4 x daily.  Dx E11.69  ? insulin aspart (NOVOLOG) 100 UNIT/ML injection Inject 5 Units into the skin 3 (three) times daily before meals.   ? isosorbide mononitrate (IMDUR) 30 MG 24 hr tablet TAKE 1 TABLET EVERY DAY  ? LEVEMIR FLEXTOUCH 100 UNIT/ML Pen Inject 34 Units into the skin at bedtime.   ? levothyroxine (SYNTHROID) 88 MCG tablet TAKE 1 TABLET DAILY BEFORE BREAKFAST.  ? lisinopril (ZESTRIL) 20 MG tablet Take 1 tablet (20 mg total) by mouth daily.  ? metFORMIN (GLUCOPHAGE) 1000 MG tablet TAKE 1 TABLET 2 TIMES DAILY WITH A MEAL  ? metoprolol succinate (TOPROL-XL) 50 MG 24 hr tablet Take 1 tablet (50 mg total) by mouth daily.  ? tamsulosin (FLOMAX) 0.4 MG CAPS capsule TAKE 1 CAPSULE AT BEDTIME  ? TRUEplus  Lancets 33G MISC USE AS DIRECTED TO MONITOR FINGER STICK BLOOD SUGAR FOUR TIMES DAILY  ? ? ?Past Medical History:  ?Diagnosis Date  ? Acute lower UTI 09/20/2017  ? Bradycardia on ECG 07/31/2011  ? Coronary artery disease   ? hyperlipidemia  ? Decreased GFR 09/23/2019  ? Dehydration 09/21/2017  ? Diabetes mellitus   ? Diabetes mellitus without complication ([place])   ? Hypercholesterolemia   ? Hypertension   ? Hypothyroidism   ? Left arm pain 01/29/2020  ? Paronychia of great toe of left foot 09/26/2018  ? Paronychia of great toe, left 09/26/2018  ? PERIPHERAL NEUROPATHY 01/13/2006  ? Qualifier: Diagnosis of  By: Truett Mainland MD,  Christine    ? Shortness of breath dyspnea   ? with exertion  ? Vasovagal syncope   ? Weakness 09/20/2017  ? ? ?Past Surgical History:  ?Procedure Laterality Date  ? APPENDECTOMY    ? CARDIAC CATHETERIZATION N/A 06/16/2014  ? Procedure: Left Heart Cath;  Surgeon: Dionisio David, MD;  Location: [facility];  Service: Cardiovascular;  Laterality: N/A;  ? CIRCUMCISION, NON-NEWBORN    ? HERNIA REPAIR    ? LOWER EXTREMITY ANGIOGRAPHY Left 12/24/2018  ? Procedure: LOWER EXTREMITY ANGIOGRAPHY;  Surgeon: Katha Cabal, MD;  Location: [facility];  Service: Cardiovascular;  Laterality: Left;  ? THYROID SURGERY    ? ? ?Social History ?Social History  ? ?Tobacco Use  ? Smoking status: Former  ? Smokeless tobacco: Never  ?Substance Use Topics  ? Alcohol use: No  ? Drug use: No  ? ? ?Family History ?Family History  ?Problem Relation Age of Onset  ? Diabetes Father   ? Hypertension Father   ? Cancer Mother   ? Diabetes Brother   ? ? ?Allergies  ?Allergen Reactions  ? Plavix [Clopidogrel] Hives  ? ? ? ?REVIEW OF SYSTEMS (Negative unless checked) ? ?Constitutional: [] Weight loss  [] Fever  [] Chills ?Cardiac: [] Chest pain   [] Chest pressure   [] Palpitations   [] Shortness of breath when laying flat   [] Shortness of breath with exertion. ?Vascular:  [] Pain in legs with walking   [] Pain in legs at rest  [] History of DVT   [] Phlebitis   [] Swelling in legs   [] Varicose veins   [] Non-healing ulcers ?Pulmonary:   [] Uses home oxygen   [] Productive cough   [] Hemoptysis   [] Wheeze  [] COPD   [] Asthma ?Neurologic:  [] Dizziness   [] Seizures   [] History of stroke   [] History of TIA  [] Aphasia   [] Vissual changes   [] Weakness or numbness in arm   [] Weakness or numbness in leg ?Musculoskeletal:   [] Joint swelling   [] Joint pain   [] Low back pain ?Hematologic:  [] Easy bruising  [] Easy bleeding   [] Hypercoagulable state   [] Anemic ?Gastrointestinal:  [] Diarrhea   [] Vomiting  [] Gastroesophageal reflux/heartburn   [] Difficulty  swallowing. ?Genitourinary:  [] Chronic kidney disease   [] Difficult urination  [] Frequent urination   [] Blood in urine ?Skin:  [] Rashes   [] Ulcers  ?Psychological:  [] History of anxiety   []  History of major depression. ? ?Physical Examination ? ?Vitals:  ? 03/28/21 1547  ?BP: 116/66  ?Pulse: 82  ?Resp: 17  ?Weight: 222 lb (100.7 kg)  ?Height: 6\' 6"  (1.981 m)  ? ?Body mass index is 25.65 kg/m?. ?Gen: WD/WN, NAD ?Head: [place]/AT, No temporalis wasting.  ?Ear/Nose/Throat: Hearing grossly intact, nares w/o erythema or drainage ?Eyes: PER, EOMI, sclera nonicteric.  ?Neck: Supple, no masses.  No bruit or JVD.  ?Pulmonary:  Good air movement, no audible wheezing, no use of accessory muscles.  ?Cardiac: RRR, normal S1, S2, no Murmurs. ?Vascular:   ?Vessel Right Left  ?Radial Palpable Palpable  ?PT Not Palpable Not Palpable  ?DP Not Palpable Not Palpable  ?Gastrointestinal: soft, non-distended. No guarding/no peritoneal signs.  ?Musculoskeletal: M/S 5/5 throughout.  No visible deformity.  ?Neurologic: CN 2-12 intact. Pain and light touch intact in extremities.  Symmetrical.  Speech is fluent. Motor exam as listed above. ?Psychiatric: Judgment intact, Mood & affect appropriate for pt's clinical situation. ?Dermatologic: No rashes or ulcers noted.  No changes consistent with cellulitis. ? ? ?CBC ?Lab Results  ?Component Value Date  ? WBC 9.4 12/31/2020  ? HGB 11.6 (L) 12/31/2020  ? HCT 36.4 (L) 12/31/2020  ? MCV 92.9 12/31/2020  ? PLT 196 12/31/2020  ? ? ?BMET ?   ?Component Value Date/Time  ? NA 138 02/02/2021 0839  ? NA 143 02/07/2019 0000  ? K 4.6 02/02/2021 0839  ? CL 103 02/02/2021 0839  ? CO2 28 02/02/2021 0839  ? GLUCOSE 312 (H) 02/02/2021 0839  ? BUN 15 02/02/2021 0839  ? BUN 15 02/07/2019 0000  ? CREATININE 1.34 (H) 02/02/2021 9022  ? CALCIUM 10.3 02/02/2021 0839  ? GFRNONAA 45 (L) 12/31/2020 1930  ? GFRNONAA 54 (L) 04/29/2020 1141  ? GFRAA 63 04/29/2020 1141  ? ?CrCl cannot be calculated (Patient's most recent lab  result is older than the maximum 21 days allowed.). ? ?COAG ?Lab Results  ?Component Value Date  ? PROTIME 12.7 06/09/2014  ? ? ?Radiology ?VAS [place] ABI WITH/WO TBI ? ?Result Date: 03/28/2021 ? LOWER EXTREMITY DOPPLE

## 2021-04-14 ENCOUNTER — Other Ambulatory Visit: Payer: Self-pay | Admitting: Nurse Practitioner

## 2021-04-14 DIAGNOSIS — E785 Hyperlipidemia, unspecified: Secondary | ICD-10-CM

## 2021-04-16 ENCOUNTER — Other Ambulatory Visit: Payer: Self-pay | Admitting: Nurse Practitioner

## 2021-04-16 DIAGNOSIS — E1169 Type 2 diabetes mellitus with other specified complication: Secondary | ICD-10-CM

## 2021-04-21 ENCOUNTER — Encounter: Payer: Self-pay | Admitting: Nutrition

## 2021-04-21 ENCOUNTER — Other Ambulatory Visit: Payer: Self-pay | Admitting: Nurse Practitioner

## 2021-04-21 DIAGNOSIS — E1169 Type 2 diabetes mellitus with other specified complication: Secondary | ICD-10-CM

## 2021-04-28 DIAGNOSIS — E119 Type 2 diabetes mellitus without complications: Secondary | ICD-10-CM | POA: Diagnosis not present

## 2021-04-28 DIAGNOSIS — H5213 Myopia, bilateral: Secondary | ICD-10-CM | POA: Diagnosis not present

## 2021-04-28 LAB — HM DIABETES EYE EXAM

## 2021-05-06 DIAGNOSIS — E1159 Type 2 diabetes mellitus with other circulatory complications: Secondary | ICD-10-CM | POA: Diagnosis not present

## 2021-05-11 ENCOUNTER — Ambulatory Visit: Payer: Medicare HMO | Admitting: Nutrition

## 2021-05-12 ENCOUNTER — Encounter (HOSPITAL_COMMUNITY): Payer: Self-pay

## 2021-05-12 ENCOUNTER — Emergency Department (HOSPITAL_COMMUNITY): Payer: Medicare HMO

## 2021-05-12 ENCOUNTER — Emergency Department (HOSPITAL_COMMUNITY)
Admission: EM | Admit: 2021-05-12 | Discharge: 2021-05-13 | Disposition: A | Discharge status: Home / Self Care | Payer: Medicare HMO | Attending: Emergency Medicine | Admitting: Emergency Medicine

## 2021-05-12 ENCOUNTER — Other Ambulatory Visit: Payer: Self-pay

## 2021-05-12 DIAGNOSIS — Z7901 Long term (current) use of anticoagulants: Secondary | ICD-10-CM | POA: Insufficient documentation

## 2021-05-12 DIAGNOSIS — K6289 Other specified diseases of anus and rectum: Secondary | ICD-10-CM | POA: Diagnosis not present

## 2021-05-12 DIAGNOSIS — Z794 Long term (current) use of insulin: Secondary | ICD-10-CM | POA: Insufficient documentation

## 2021-05-12 DIAGNOSIS — E875 Hyperkalemia: Secondary | ICD-10-CM | POA: Diagnosis not present

## 2021-05-12 DIAGNOSIS — Z7984 Long term (current) use of oral hypoglycemic drugs: Secondary | ICD-10-CM | POA: Diagnosis not present

## 2021-05-12 DIAGNOSIS — K5120 Ulcerative (chronic) proctitis without complications: Secondary | ICD-10-CM | POA: Diagnosis not present

## 2021-05-12 DIAGNOSIS — R197 Diarrhea, unspecified: Secondary | ICD-10-CM | POA: Diagnosis present

## 2021-05-12 DIAGNOSIS — Z79899 Other long term (current) drug therapy: Secondary | ICD-10-CM | POA: Insufficient documentation

## 2021-05-12 DIAGNOSIS — R109 Unspecified abdominal pain: Secondary | ICD-10-CM | POA: Diagnosis not present

## 2021-05-12 LAB — CBC
HCT: 39.7 % (ref 39.0–52.0)
Hemoglobin: 12.7 g/dL — ABNORMAL LOW (ref 13.0–17.0)
MCH: 29.8 pg (ref 26.0–34.0)
MCHC: 32 g/dL (ref 30.0–36.0)
MCV: 93.2 fL (ref 80.0–100.0)
Platelets: 252 10*3/uL (ref 150–400)
RBC: 4.26 MIL/uL (ref 4.22–5.81)
RDW: 15.1 % (ref 11.5–15.5)
WBC: 15.1 10*3/uL — ABNORMAL HIGH (ref 4.0–10.5)
nRBC: 0 % (ref 0.0–0.2)

## 2021-05-12 LAB — COMPREHENSIVE METABOLIC PANEL
ALT: 13 U/L (ref 0–44)
AST: 20 U/L (ref 15–41)
Albumin: 4 g/dL (ref 3.5–5.0)
Alkaline Phosphatase: 94 U/L (ref 38–126)
Anion gap: 11 (ref 5–15)
BUN: 24 mg/dL — ABNORMAL HIGH (ref 8–23)
CO2: 23 mmol/L (ref 22–32)
Calcium: 10.1 mg/dL (ref 8.9–10.3)
Chloride: 103 mmol/L (ref 98–111)
Creatinine, Ser: 1.56 mg/dL — ABNORMAL HIGH (ref 0.61–1.24)
GFR, Estimated: 44 mL/min — ABNORMAL LOW (ref >60)
Glucose, Bld: 295 mg/dL — ABNORMAL HIGH (ref 70–99)
Potassium: 5.6 mmol/L — ABNORMAL HIGH (ref 3.5–5.1)
Sodium: 137 mmol/L (ref 135–145)
Total Bilirubin: 1.2 mg/dL (ref 0.3–1.2)
Total Protein: 7.5 g/dL (ref 6.5–8.1)

## 2021-05-12 LAB — TYPE AND SCREEN
ABO/RH(D): A POS
Antibody Screen: NEGATIVE

## 2021-05-12 LAB — PROTIME-INR
INR: 1 (ref 0.8–1.2)
Prothrombin Time: 12.9 seconds (ref 11.4–15.2)

## 2021-05-12 LAB — ABO/RH: ABO/RH(D): A POS

## 2021-05-12 MED ORDER — IOHEXOL 300 MG/ML  SOLN
100.0000 mL | Freq: Once | INTRAMUSCULAR | Status: AC | PRN
  Administered 2021-05-12: 75 mL via INTRAVENOUS

## 2021-05-12 MED ORDER — LACTATED RINGERS IV BOLUS
1000.0000 mL | Freq: Once | INTRAVENOUS | Status: AC
  Administered 2021-05-13: 1000 mL via INTRAVENOUS

## 2021-05-12 NOTE — ED Provider Triage Note (Signed)
Emergency Medicine Provider Triage Evaluation Note ? ?Dylan Robles , a 81 y.o. male  was evaluated in triage.  Pt complains of abdominal discomfort, GI bleeding, diarrhea, nausea. ? ?Review of Systems  ?Positive: Positive for abdominal pain nausea and lower GI bleeding with bright red blood ?Negative: Fevers, chills, chest pain or shortness of breath, mild generalized weakness ? ?Physical Exam  ?BP (!) 152/97 (BP Location: Right Arm)   Pulse (!) 102   Temp 99.9 ?F (37.7 ?C) (Oral)   Resp 17   Ht 1.981 m ('6\' 6"'$ )   Wt 100.7 kg   SpO2 99%   BMI 25.65 kg/m?  ?Gen:   Awake, no distress   ?Resp:  Normal effort  ?MSK:   Moves extremities without difficulty  ?Other:  Mild edema, abdomen nontender, normal bowel sounds ? ?Medical Decision Making  ?Medically screening exam initiated at 10:19 PM.  Appropriate orders placed.  ADEEL GUIFFRE was informed that the remainder of the evaluation will be completed by another provider, this initial triage assessment does not replace that evaluation, and the importance of remaining in the ED until their evaluation is complete. ? ?May need CT scan to make sure there is not a segment of ischemic colitis, has borderline fever of 99.9 with a mild tachycardia.  CT scan will be ordered, the patient is not significantly anemic, he does take anticoagulants including Brilinta and aspirin ?  ?Noemi Chapel, MD ?05/12/21 2220 ? ?

## 2021-05-12 NOTE — ED Triage Notes (Signed)
Reports n/v/d that started today.  Reports blood in stool.  Pt is alert and oriented.  Resp even and unlabored.  Skin warm and dry.  nad ?

## 2021-05-13 LAB — POTASSIUM: Potassium: 5.2 mmol/L — ABNORMAL HIGH (ref 3.5–5.1)

## 2021-05-13 MED ORDER — DOCUSATE SODIUM 100 MG PO CAPS: 100.0000 mg | ORAL_CAPSULE | Freq: Two times a day (BID) | ORAL | 0 refills | Status: AC

## 2021-05-13 MED ORDER — SODIUM ZIRCONIUM CYCLOSILICATE 5 G PO PACK
10.0000 g | PACK | Freq: Once | ORAL | Status: AC
  Administered 2021-05-13: 10 g via ORAL
  Filled 2021-05-13: qty 2

## 2021-05-13 MED ORDER — AMOXICILLIN-POT CLAVULANATE 875-125 MG PO TABS
1.0000 | ORAL_TABLET | Freq: Once | ORAL | Status: AC
Start: 2021-05-13 — End: 2021-05-13
  Administered 2021-05-13: 1 via ORAL
  Filled 2021-05-13: qty 1

## 2021-05-13 MED ORDER — AMOXICILLIN-POT CLAVULANATE 875-125 MG PO TABS: 1.0000 | ORAL_TABLET | Freq: Two times a day (BID) | ORAL | 0 refills | Status: DC

## 2021-05-13 NOTE — ED Provider Notes (Signed)
?[facility] ?Provider Note ? ? ?CSN: [id number] ?Arrival date & time: 05/12/21  2009 ? ?  ? ?History ? ?Chief Complaint  ?Patient presents with  ? GI Bleeding  ? ? ?Dylan Robles is a 81 y.o. male. ? ?81 year old male with multimedical problems as documented that presents to the ER today with a couple episodes of diarrhea associated with a couple episodes of bright red blood in his stool as well.  Patient started with some abdominal pain earlier in the day.  He had 2 or 3 normal bowel movements and subsequently they got looser.  He had difficulty controlling his bowels and had diarrhea which had couple spots of blood in it but was not too significant.  He later had another episode of diarrhea that had more blood in it so they present here for evaluation.  Patient does take aspirin and Brilinta.  No loss of consciousness, dyspnea, fatigue or other associated symptoms.  No history of the same.  Abdominal pain is improved and so is his diarrhea.  He did have 1 episode of nonbloody nonbilious emesis at some point prior to me seeing him.  No fevers.  Did have some chills. ? ? ? ?  ? ?Home Medications ?Prior to Admission medications   ?Medication Sig Start Date End Date Taking? Authorizing Provider  ?amoxicillin-clavulanate (AUGMENTIN) 875-125 MG tablet Take 1 tablet by mouth 2 (two) times daily. One po bid x 7 days 05/13/21  Yes Andera Cranmer, Corene Cornea, MD  ?docusate sodium (COLACE) 100 MG capsule Take 1 capsule (100 mg total) by mouth every 12 (twelve) hours for 10 days. 05/13/21 05/23/21 Yes Shenoa Hattabaugh, Corene Cornea, MD  ?amLODipine (NORVASC) 5 MG tablet TAKE 1 TABLET EVERY DAY 07/21/20   [name], NP  ?Ascorbic Acid (VITAMIN C) 500 MG CAPS Take by mouth once. daily    [provider]  ?aspirin EC 81 MG tablet Take 81 mg by mouth daily.     [provider]  ?atorvastatin (LIPITOR) 80 MG tablet TAKE 1 TABLET AT BEDTIME. 07/16/20   [name], NP  ?Blood Glucose Calibration (TRUE METRIX LEVEL  1) Low SOLN 1 each by In Vitro route as directed. 08/15/19   Annie Main, FNP  ?Blood Glucose Monitoring Suppl (BLOOD GLUCOSE SYSTEM PAK) KIT Please dispense based on patient and insurance preference.  Use as directed to monitor FSBS 4 x daily.  Dx E11.69 12/16/20   [name], NP  ?BRILINTA 60 MG TABS tablet Take 60 mg by mouth 2 (two) times daily. 02/05/21   [provider]  ?cetirizine (ZYRTEC) 5 MG tablet Take 1 tablet (5 mg total) by mouth daily. 02/02/21   [name], NP  ?diclofenac Sodium (VOLTAREN) 1 % GEL Apply 2 g topically 2 (two) times daily as needed. 01/29/20   [name], NP  ?DROPLET INSULIN SYRINGE 30G X 5/16" 1 ML MISC USE AS DIRECTED 09/23/19   Annie Main, FNP  ?ezetimibe (ZETIA) 10 MG tablet Take 10 mg by mouth daily. 09/09/19   [provider]  ?Fish Oil-Vitamin D 1000-1000 MG-UNIT CAPS Take by mouth once. daily    [provider]  ?furosemide (LASIX) 20 MG tablet Take 20 mg by mouth as needed.    [provider]  ?gabapentin (NEURONTIN) 300 MG capsule Take 1 capsule (300 mg total) by mouth 3 (three) times daily. 01/29/20   [name], NP  ?glipiZIDE (GLUCOTROL) 5 MG tablet TAKE 1/2 TABLET DAILY BEFORE  BREAKFAST 07/16/20   [name], NP  ?glucose blood test strip Please dispense based on patient and insurance preference.  Use as directed to monitor FSBS 4 x daily.  Dx E11.69 12/16/20   [name], NP  ?insulin aspart (NOVOLOG) 100 UNIT/ML injection Inject 5 Units into the skin 3 (three) times daily before meals.     [provider]  ?isosorbide mononitrate (IMDUR) 30 MG 24 hr tablet TAKE 1 TABLET EVERY DAY 09/08/20   [name], NP  ?ketoconazole (NIZORAL) 2 % cream Apply 1 application topically daily. ?Patient not taking: Reported on 03/10/2021 02/04/20   Criselda Peaches, DPM  ?ketorolac (ACULAR) 0.5 % ophthalmic solution  05/10/20   [provider]  ?LEVEMIR FLEXTOUCH 100 UNIT/ML  Pen Inject 34 Units into the skin at bedtime.  11/22/18   [provider]  ?levothyroxine (SYNTHROID) 88 MCG tablet TAKE 1 TABLET DAILY BEFORE BREAKFAST. 07/21/20   [name], NP  ?lisinopril (ZESTRIL) 20 MG tablet Take 1 tablet (20 mg total) by mouth daily. 02/04/21   [name], NP  ?metFORMIN (GLUCOPHAGE) 1000 MG tablet TAKE 1 TABLET 2 TIMES DAILY WITH A MEAL 07/28/20   Noemi Chapel A, NP  ?metoprolol succinate (TOPROL-XL) 50 MG 24 hr tablet Take 1 tablet (50 mg total) by mouth daily. 03/19/19   Annie Main, FNP  ?ofloxacin (OCUFLOX) 0.3 % ophthalmic solution  05/10/20   [provider]  ?tamsulosin (FLOMAX) 0.4 MG CAPS capsule TAKE 1 CAPSULE AT BEDTIME 12/06/20   [name], NP  ?TRUEplus Lancets 33G MISC USE AS DIRECTED TO MONITOR FINGER STICK BLOOD SUGAR FOUR TIMES DAILY 01/11/21   [name], NP  ?   ? ?Allergies    ?Plavix [clopidogrel]   ? ?Review of Systems   ?Review of Systems ? ?Physical Exam ?Updated Vital Signs ?BP 139/87   Pulse (!) 108   Temp 98.9 ?F (37.2 ?C)   Resp 15   Ht _0  (1.981 m)   Wt 100.7 kg   SpO2 95%   BMI 25.65 kg/m?  ?Physical Exam ?Vitals and nursing note reviewed.  ?Constitutional:   ?   Appearance: He is well-developed.  ?HENT:  ?   Head: Normocephalic and atraumatic.  ?   Mouth/Throat:  ?   Mouth: Mucous membranes are moist.  ?   Pharynx: Oropharynx is clear.  ?Eyes:  ?   Pupils: Pupils are equal, round, and reactive to light.  ?Cardiovascular:  ?   Rate and Rhythm: Normal rate.  ?Pulmonary:  ?   Effort: Pulmonary effort is normal. No respiratory distress.  ?Abdominal:  ?   General: Abdomen is flat. There is no distension.  ?Musculoskeletal:     ?   General: Normal range of motion.  ?   Cervical back: Normal range of motion.  ?Skin: ?   General: Skin is warm and dry.  ?   Coloration: Skin is not jaundiced.  ?Neurological:  ?   General: No focal deficit present.  ?   Mental Status: He is alert.  ? ? ?ED Results /  Procedures / Treatments   ?Labs ?(all labs ordered are listed, but only abnormal results are displayed) ?Labs Reviewed  ?COMPREHENSIVE METABOLIC PANEL - Abnormal; Notable for the following components:  ?    Result Value  ? Potassium 5.6 (*)   ? Glucose, Bld 295 (*)   ? BUN 24 (*)   ? Creatinine, Ser 1.56 (*)   ?  GFR, Estimated 44 (*)   ? All other components within normal limits  ?CBC - Abnormal; Notable for the following components:  ? WBC 15.1 (*)   ? Hemoglobin 12.7 (*)   ? All other components within normal limits  ?POTASSIUM - Abnormal; Notable for the following components:  ? Potassium 5.2 (*)   ? All other components within normal limits  ?PROTIME-INR  ?POC OCCULT BLOOD, ED  ?TYPE AND SCREEN  ?ABO/RH  ? ? ?EKG ?None ? ?Radiology ?CT ABDOMEN PELVIS W CONTRAST ? ?Result Date: 05/12/2021 ?CLINICAL DATA:  Abdominal pain with diarrhea EXAM: CT ABDOMEN AND PELVIS WITH CONTRAST TECHNIQUE: Multidetector CT imaging of the abdomen and pelvis was performed using the standard protocol following bolus administration of intravenous contrast. RADIATION DOSE REDUCTION: This exam was performed according to the departmental dose-optimization program which includes automated exposure control, adjustment of the mA and/or kV according to patient size and/or use of iterative reconstruction technique. CONTRAST:  6m OMNIPAQUE IOHEXOL 300 MG/ML  SOLN COMPARISON:  09/22/2017 FINDINGS: Lower Chest: Area of suspected scarring in the right middle lobe. Lung bases are otherwise clear. Hepatobiliary: Normal hepatic contours. No intra- or extrahepatic biliary dilatation. Normal gallbladder. Pancreas: Normal pancreas. No ductal dilatation or peripancreatic fluid collection. Spleen: Normal. Adrenals/Urinary Tract: The adrenal glands are normal. No hydronephrosis, nephroureterolithiasis or solid renal mass. The urinary bladder is normal for degree of distention Stomach/Bowel: There is no hiatal hernia. Normal duodenal course and caliber. No  small bowel dilatation or inflammation. There is mild stranding around the sigmoid colon and proximal rectum. Status post appendectomy. Vascular/Lymphatic: There is calcific atherosclerosis of the abdominal aorta. N

## 2021-05-17 ENCOUNTER — Encounter: Payer: Self-pay | Admitting: Nurse Practitioner

## 2021-05-17 ENCOUNTER — Ambulatory Visit (INDEPENDENT_AMBULATORY_CARE_PROVIDER_SITE_OTHER): Payer: Medicare HMO | Admitting: Nurse Practitioner

## 2021-05-17 VITALS — BP 136/68 | HR 78 | Ht 78.0 in | Wt 219.0 lb

## 2021-05-17 DIAGNOSIS — Z09 Encounter for follow-up examination after completed treatment for conditions other than malignant neoplasm: Secondary | ICD-10-CM | POA: Diagnosis not present

## 2021-05-17 DIAGNOSIS — E119 Type 2 diabetes mellitus without complications: Secondary | ICD-10-CM

## 2021-05-17 DIAGNOSIS — K6289 Other specified diseases of anus and rectum: Secondary | ICD-10-CM | POA: Diagnosis not present

## 2021-05-17 DIAGNOSIS — E785 Hyperlipidemia, unspecified: Secondary | ICD-10-CM

## 2021-05-17 DIAGNOSIS — E1169 Type 2 diabetes mellitus with other specified complication: Secondary | ICD-10-CM | POA: Diagnosis not present

## 2021-05-17 DIAGNOSIS — I10 Essential (primary) hypertension: Secondary | ICD-10-CM

## 2021-05-17 DIAGNOSIS — Z0001 Encounter for general adult medical examination with abnormal findings: Secondary | ICD-10-CM | POA: Insufficient documentation

## 2021-05-17 NOTE — Assessment & Plan Note (Signed)
Now resolved, denies bloody stool, abdominal pain, nausea, vomiting, diarrhea, hematemesis. ?Currently on Augmentin 875--125 mg twice daily ?Need to complete full course of antibiotics discussed with patient verbalized understanding. ?

## 2021-05-17 NOTE — Patient Instructions (Signed)

## 2021-05-17 NOTE — Assessment & Plan Note (Signed)
?  BP Readings from Last 3 Encounters:  ?05/17/21 136/68  ?05/13/21 139/87  ?03/28/21 116/66  ?On amlodipine 5 mg daily, isosorbide mononitrate 30 mg daily, lisinopril 20 mg daily, metoprolol 50 mg daily ?Continue current medications ?DASH diet advised engage in regular exercises at least for 150 minutes weekly as tolerated ?Lab Results  ?Component Value Date  ? NA 137 05/12/2021  ? K 5.2 (H) 05/13/2021  ? CO2 23 05/12/2021  ? GLUCOSE 295 (H) 05/12/2021  ? BUN 24 (H) 05/12/2021  ? CREATININE 1.56 (H) 05/12/2021  ? CALCIUM 10.1 05/12/2021  ? EGFR 53 (L) 02/02/2021  ? GFRNONAA 44 (L) 05/12/2021  ? ?

## 2021-05-17 NOTE — Assessment & Plan Note (Signed)
follow up for ER visit for proctitis pt currently denies bloddy stool, abdominal pain, N/V, diarrhea, states that he has been taking Augmentin twice daily as prescribed. ?Hospital after visit summary, labs reviewed today ?

## 2021-05-17 NOTE — Progress Notes (Signed)
? ?  Dylan Robles     MRN: [id number]      DOB: 1940-04-25 ? ? ?HPI ?Dylan Robles with past medical history of type 2 diabetes, PVD, CAD, hypothyroidism, stage IIIa CKD, hyperlipidemia is here for follow up for ER visit for proctitis pt currently denies bloddy stool, abdominal pain, N/V, diarrhea, states that he has been taking Augmentin twice daily as prescribed. ? ? ?ROS ?Denies recent fever or chills. ?Denies sinus pressure, nasal congestion, ear pain or sore throat. ?Denies chest congestion, productive cough or wheezing. ?Denies chest pains, palpitations and leg swelling ?Denies abdominal pain, nausea, vomiting,diarrhea or constipation.   ?Denies dysuria, frequency, hesitancy or incontinence. ?Denies headaches, seizures, numbness, or tingling. ?Denies depression, anxiety or insomnia. ? ? ? ?PE ? ?BP 136/68 (BP Location: Right Arm, Cuff Size: Large)   Pulse 78   Ht $R'6\' 6"'aX$  (1.981 m)   Wt 219 lb (99.3 kg)   SpO2 99%   BMI 25.31 kg/m?  ? ?Patient alert and oriented and in no cardiopulmonary distress. ? ?HEENT: No facial asymmetry, EOMI,     Neck supple . ? ?Chest: Clear to auscultation bilaterally. ? ?CVS: S1, S2 no murmurs, no S3.Regular rate. ? ?ABD: Soft non tender.  ? ?Ext: No edema ? ?MS: Adequate ROM spine, shoulders, hips and knees. ? ?Psych: Good eye contact, normal affect. Memory intact not anxious or depressed appearing. ? ? ? ?Assessment & Plan ? ?Encounter for examination following treatment at hospital ?follow up for ER visit for proctitis pt currently denies bloddy stool, abdominal pain, N/V, diarrhea, states that he has been taking Augmentin twice daily as prescribed. ?Hospital after visit summary, labs reviewed today ? ?Essential hypertension ? ?BP Readings from Last 3 Encounters:  ?05/17/21 136/68  ?05/13/21 139/87  ?03/28/21 116/66  ?On amlodipine 5 mg daily, isosorbide mononitrate 30 mg daily, lisinopril 20 mg daily, metoprolol 50 mg daily ?Continue current medications ?DASH diet advised engage in  regular exercises at least for 150 minutes weekly as tolerated ?Lab Results  ?Component Value Date  ? NA 137 05/12/2021  ? K 5.2 (H) 05/13/2021  ? CO2 23 05/12/2021  ? GLUCOSE 295 (H) 05/12/2021  ? BUN 24 (H) 05/12/2021  ? CREATININE 1.56 (H) 05/12/2021  ? CALCIUM 10.1 05/12/2021  ? EGFR 53 (L) 02/02/2021  ? GFRNONAA 44 (L) 05/12/2021  ? ? ?Proctitis ?Now resolved, denies bloody stool, abdominal pain, nausea, vomiting, diarrhea, hematemesis. ?Currently on Augmentin 875--125 mg twice daily ?Need to complete full course of antibiotics discussed with patient verbalized understanding. ? ?Type 2 diabetes mellitus with hyperlipidemia ([place]) ?Uncontrolled condition ?On glipizide 2.5 mg daily, NovoLog 5 units 3 times daily, Levemir 34 units at bedtime, metformin 1000 mg twice daily, denies hypoglycemic episodes at home. ?Has upcoming appointment at [facility]  endocrinology in November but patient agreed for referral to Rayetta Pigg NP at [facility] endocrinology, referral placed today.  Need to get diabetes under control discussed with patient and his family he verbalized understanding. ?Avoid sugar sweets soda juice.  ?

## 2021-05-17 NOTE — Assessment & Plan Note (Addendum)
Uncontrolled condition ?On glipizide 2.5 mg daily, NovoLog 5 units 3 times daily, Levemir 34 units at bedtime, metformin 1000 mg twice daily, denies hypoglycemic episodes at home. ?Has upcoming appointment at [facility]  endocrinology in November but patient agreed for referral to Rayetta Pigg NP at [facility] endocrinology, referral placed today.  Need to get diabetes under control discussed with patient and his family he verbalized understanding. ?Avoid sugar sweets soda juice. ?

## 2021-05-30 ENCOUNTER — Other Ambulatory Visit: Payer: Self-pay | Admitting: Nurse Practitioner

## 2021-05-30 DIAGNOSIS — E1142 Type 2 diabetes mellitus with diabetic polyneuropathy: Secondary | ICD-10-CM

## 2021-05-30 DIAGNOSIS — E1159 Type 2 diabetes mellitus with other circulatory complications: Secondary | ICD-10-CM

## 2021-05-30 DIAGNOSIS — I10 Essential (primary) hypertension: Secondary | ICD-10-CM

## 2021-05-31 NOTE — Telephone Encounter (Signed)
Appears pt has PCP. Former pt of Noemi Chapel. Requested Prescriptions  Pending Prescriptions Disp Refills  . isosorbide mononitrate (IMDUR) 30 MG 24 hr tablet [Pharmacy Med Name: ISOSORBIDE MONONITRATE ER 30 MG Tablet Extended Release 24 Hour] 90 tablet 1    Sig: TAKE 1 TABLET EVERY DAY     Cardiovascular:  Nitrates Passed - 05/30/2021 11:06 AM      Passed - Last BP in normal range    BP Readings from Last 1 Encounters:  05/17/21 136/68         Passed - Last Heart Rate in normal range    Pulse Readings from Last 1 Encounters:  05/17/21 78         Passed - Valid encounter within last 12 months    Recent Outpatient Visits          3 months ago Type 2 diabetes mellitus with hyperlipidemia ([place])   [place] Noemi Chapel A, NP   5 months ago Type 2 diabetes mellitus with hyperlipidemia ([place])   [place] Noemi Chapel A, NP   6 months ago Type 2 diabetes mellitus with hyperlipidemia ([place])   [place] [name], NP   10 months ago Type 2 diabetes mellitus with hyperlipidemia ([place])   [place] [name], NP   11 months ago Burning with urination   [place] [name], NP      Future Appointments            In 1 week [place], Dewaine Conger, FNP [facility]           . amLODipine (NORVASC) 5 MG tablet [Pharmacy Med Name: AMLODIPINE BESYLATE 5 MG Tablet] 90 tablet 1    Sig: TAKE 1 TABLET EVERY DAY     Cardiovascular: Calcium Channel Blockers 2 Passed - 05/30/2021 11:06 AM      Passed - Last BP in normal range    BP Readings from Last 1 Encounters:  05/17/21 136/68         Passed - Last Heart Rate in normal range    Pulse Readings from Last 1 Encounters:  05/17/21 78         Passed - Valid encounter within last 6 months    Recent Outpatient Visits          3 months ago Type 2 diabetes mellitus with hyperlipidemia ([place])    [place] Noemi Chapel A, NP   5 months ago Type 2 diabetes mellitus with hyperlipidemia ([place])   [place] Noemi Chapel A, NP   6 months ago Type 2 diabetes mellitus with hyperlipidemia ([place])   [place] [name], NP   10 months ago Type 2 diabetes mellitus with hyperlipidemia ([place])   [place] [name], NP   11 months ago Burning with urination   [place] [name], NP      Future Appointments            In 1 week Paseda, Dewaine Conger, FNP [facility] [place]           . glipiZIDE (GLUCOTROL) 5 MG tablet [Pharmacy Med Name: GLIPIZIDE 5 MG Tablet] 45 tablet 1    Sig: TAKE 1/2 TABLET DAILY BEFORE BREAKFAST     Endocrinology:  Diabetes - Sulfonylureas Failed - 05/30/2021 11:06 AM  Failed - HBA1C is between 0 and 7.9 and within 180 days    Hemoglobin A1C  Date Value Ref Range Status  02/07/2019 10.7  Final   Hgb A1c MFr Bld  Date Value Ref Range Status  02/02/2021 10.5 (H) <5.7 % of total Hgb Final    Comment:    For someone without known diabetes, a hemoglobin A1c value of 6.5% or greater indicates that they may have  diabetes and this should be confirmed with a follow-up  test. . For someone with known diabetes, a value <7% indicates  that their diabetes is well controlled and a value  greater than or equal to 7% indicates suboptimal  control. A1c targets should be individualized based on  duration of diabetes, age, comorbid conditions, and  other considerations. . Currently, no consensus exists regarding use of hemoglobin A1c for diagnosis of diabetes for children. .          Failed - Cr in normal range and within 360 days    Creat  Date Value Ref Range Status  02/02/2021 1.34 (H) 0.70 - 1.22 mg/dL Final   Creatinine, Ser  Date Value Ref Range Status  05/12/2021 1.56 (H) 0.61 - 1.24 mg/dL Final   Creatinine,  Urine  Date Value Ref Range Status  12/17/2006 166.9 mg/dL Final         Passed - Valid encounter within last 6 months    Recent Outpatient Visits          3 months ago Type 2 diabetes mellitus with hyperlipidemia ([place])   [place] [name], NP   5 months ago Type 2 diabetes mellitus with hyperlipidemia ([place])   [place] [name], NP   6 months ago Type 2 diabetes mellitus with hyperlipidemia ([place])   [facility] [name], NP   10 months ago Type 2 diabetes mellitus with hyperlipidemia ([place])   [place] [name], NP   11 months ago Burning with urination   [place] [name], NP      Future Appointments            In 1 week Paseda, Dewaine Conger, FNP [facility]           . gabapentin (NEURONTIN) 300 MG capsule [Pharmacy Med Name: GABAPENTIN 300 MG Capsule] 270 capsule 1    Sig: TAKE [place]     Neurology: Anticonvulsants - gabapentin Failed - 05/30/2021 11:06 AM      Failed - Cr in normal range and within 360 days    Creat  Date Value Ref Range Status  02/02/2021 1.34 (H) 0.70 - 1.22 mg/dL Final   Creatinine, Ser  Date Value Ref Range Status  05/12/2021 1.56 (H) 0.61 - 1.24 mg/dL Final   Creatinine, Urine  Date Value Ref Range Status  12/17/2006 166.9 mg/dL Final         Passed - Completed PHQ-2 or PHQ-9 in the last 360 days      Passed - Valid encounter within last 12 months    Recent Outpatient Visits          3 months ago Type 2 diabetes mellitus with hyperlipidemia ([place])   [place] [name], NP   5 months ago Type 2 diabetes mellitus with hyperlipidemia ([place])   [facility] [name], NP   6 months  ago Type 2 diabetes mellitus with hyperlipidemia ([place])   [place] [name], NP   10 months  ago Type 2 diabetes mellitus with hyperlipidemia [facility])   [place] [name], NP   11 months ago Burning with urination   [place] [name], NP      Future Appointments            In 1 week Paseda, Dewaine Conger, FNP [facility]

## 2021-06-02 ENCOUNTER — Other Ambulatory Visit: Payer: Self-pay | Admitting: Nurse Practitioner

## 2021-06-02 DIAGNOSIS — E039 Hypothyroidism, unspecified: Secondary | ICD-10-CM

## 2021-06-08 ENCOUNTER — Encounter: Payer: Self-pay | Admitting: Nurse Practitioner

## 2021-06-08 ENCOUNTER — Ambulatory Visit (INDEPENDENT_AMBULATORY_CARE_PROVIDER_SITE_OTHER): Payer: PRIVATE HEALTH INSURANCE | Admitting: Nurse Practitioner

## 2021-06-08 VITALS — BP 130/71 | HR 80 | Ht 78.0 in | Wt 222.0 lb

## 2021-06-08 DIAGNOSIS — Z794 Long term (current) use of insulin: Secondary | ICD-10-CM | POA: Diagnosis not present

## 2021-06-08 DIAGNOSIS — E039 Hypothyroidism, unspecified: Secondary | ICD-10-CM

## 2021-06-08 DIAGNOSIS — E1122 Type 2 diabetes mellitus with diabetic chronic kidney disease: Secondary | ICD-10-CM | POA: Diagnosis not present

## 2021-06-08 DIAGNOSIS — N1832 Chronic kidney disease, stage 3b: Secondary | ICD-10-CM | POA: Diagnosis not present

## 2021-06-08 LAB — POCT GLYCOSYLATED HEMOGLOBIN (HGB A1C): HbA1c POC (<> result, manual entry): 7.6 % (ref 4.0–5.6)

## 2021-06-08 NOTE — Progress Notes (Signed)
Endocrinology Consult Note       06/08/2021, 3:36 PM   Subjective:    Patient ID: Dylan Robles, male    DOB: [birth year].  Dylan Robles is being seen in consultation for management of currently uncontrolled symptomatic diabetes requested by  Renee Rival, FNP.   Past Medical History:  Diagnosis Date   Acute lower UTI 09/20/2017   Bradycardia on ECG 07/31/2011   Coronary artery disease    hyperlipidemia   Decreased GFR 09/23/2019   Dehydration 09/21/2017   Diabetes mellitus    Diabetes mellitus without complication ([place])    Hypercholesterolemia    Hypertension    Hypothyroidism    Left arm pain 01/29/2020   Paronychia of great toe of left foot 09/26/2018   Paronychia of great toe, left 09/26/2018   PERIPHERAL NEUROPATHY 01/13/2006   Qualifier: Diagnosis of  By: Truett Mainland MD, Christine     Shortness of breath dyspnea    with exertion   Vasovagal syncope    Weakness 09/20/2017    Past Surgical History:  Procedure Laterality Date   APPENDECTOMY     CARDIAC CATHETERIZATION N/A 06/16/2014   Procedure: Left Heart Cath;  Surgeon: Dionisio David, MD;  Location: [facility];  Service: Cardiovascular;  Laterality: N/A;   CIRCUMCISION, NON-NEWBORN     HERNIA REPAIR     LOWER EXTREMITY ANGIOGRAPHY Left 12/24/2018   Procedure: LOWER EXTREMITY ANGIOGRAPHY;  Surgeon: Katha Cabal, MD;  Location: [facility];  Service: Cardiovascular;  Laterality: Left;   THYROID SURGERY      Social History   Socioeconomic History   Marital status: Married    Spouse name: Not on file   Number of children: Not on file   Years of education: Not on file   Highest education level: Not on file  Occupational History   Occupation: Truck Geophysicist/field seismologist    Comment: Retired  Tobacco Use   Smoking status: Former   Smokeless tobacco: Never  Scientific laboratory technician Use: Never used  Substance and Sexual Activity   Alcohol use:  No   Drug use: No   Sexual activity: Not on file  Other Topics Concern   Not on file  Social History Narrative   ** Merged History Encounter **       Social Determinants of Health   Financial Resource Strain: Low Risk    Difficulty of Paying Living Expenses: Not very hard  Food Insecurity: No Food Insecurity   Worried About Charity fundraiser in the Last Year: Never true   Arboriculturist in the Last Year: Never true  Transportation Needs: No Transportation Needs   Lack of Transportation (Medical): No   Lack of Transportation (Non-Medical): No  Physical Activity: Insufficiently Active   Days of Exercise per Week: 7 days   Minutes of Exercise per Session: 20 min  Stress: No Stress Concern Present   Feeling of Stress : Not at all  Social Connections: Socially Integrated   Frequency of Communication with Friends and Family: More than three times a week   Frequency of Social Gatherings with Friends and Family: More than three times a  week   Attends Religious Services: More than 4 times per year   Active Member of Clubs or Organizations: Yes   Attends Archivist Meetings: 1 to 4 times per year   Marital Status: Married    Family History  Problem Relation Age of Onset   Diabetes Father    Hypertension Father    Cancer Mother    Diabetes Brother     Outpatient Encounter Medications as of 06/08/2021  Medication Sig   amLODipine (NORVASC) 5 MG tablet TAKE 1 TABLET EVERY DAY   aspirin EC 81 MG tablet Take 81 mg by mouth daily.    atorvastatin (LIPITOR) 80 MG tablet TAKE 1 TABLET AT BEDTIME.   BRILINTA 60 MG TABS tablet Take 60 mg by mouth 2 (two) times daily.   cetirizine (ZYRTEC) 5 MG tablet Take 1 tablet (5 mg total) by mouth daily.   ezetimibe (ZETIA) 10 MG tablet Take 10 mg by mouth daily.   Fish Oil-Vitamin D 1000-1000 MG-UNIT CAPS Take by mouth once. daily   furosemide (LASIX) 20 MG tablet Take 20 mg by mouth as needed.   gabapentin (NEURONTIN) 300 MG capsule  Take 1 capsule (300 mg total) by mouth 3 (three) times daily.   insulin aspart (NOVOLOG) 100 UNIT/ML injection Inject 4-10 Units into the skin 3 (three) times daily before meals.   isosorbide mononitrate (IMDUR) 30 MG 24 hr tablet TAKE 1 TABLET EVERY DAY   LEVEMIR FLEXTOUCH 100 UNIT/ML Pen Inject 22 Units into the skin at bedtime.   levothyroxine (SYNTHROID) 88 MCG tablet TAKE 1 TABLET DAILY BEFORE BREAKFAST.   lisinopril (ZESTRIL) 20 MG tablet Take 1 tablet (20 mg total) by mouth daily.   metoprolol succinate (TOPROL-XL) 50 MG 24 hr tablet Take 1 tablet (50 mg total) by mouth daily.   tamsulosin (FLOMAX) 0.4 MG CAPS capsule TAKE 1 CAPSULE AT BEDTIME   [DISCONTINUED] glipiZIDE (GLUCOTROL) 5 MG tablet TAKE 1/2 TABLET DAILY BEFORE BREAKFAST   [DISCONTINUED] metFORMIN (GLUCOPHAGE) 1000 MG tablet TAKE 1 TABLET 2 TIMES DAILY WITH A MEAL   [DISCONTINUED] amoxicillin-clavulanate (AUGMENTIN) 875-125 MG tablet Take 1 tablet by mouth 2 (two) times daily. One po bid x 7 days   [DISCONTINUED] Ascorbic Acid (VITAMIN C) 500 MG CAPS Take by mouth once. daily   [DISCONTINUED] Blood Glucose Calibration (TRUE METRIX LEVEL 1) Low SOLN 1 each by In Vitro route as directed.   [DISCONTINUED] Blood Glucose Monitoring Suppl (BLOOD GLUCOSE SYSTEM PAK) KIT Please dispense based on patient and insurance preference.  Use as directed to monitor FSBS 4 x daily.  Dx E11.69   [DISCONTINUED] diclofenac Sodium (VOLTAREN) 1 % GEL Apply 2 g topically 2 (two) times daily as needed. (Patient not taking: Reported on 05/17/2021)   [DISCONTINUED] DROPLET INSULIN SYRINGE 30G X 5/16" 1 ML MISC USE AS DIRECTED   [DISCONTINUED] glucose blood test strip Please dispense based on patient and insurance preference.  Use as directed to monitor FSBS 4 x daily.  Dx E11.69   [DISCONTINUED] ketoconazole (NIZORAL) 2 % cream Apply 1 application topically daily. (Patient not taking: Reported on 03/10/2021)   [DISCONTINUED] ketorolac (ACULAR) 0.5 % ophthalmic  solution  (Patient not taking: Reported on 03/10/2021)   [DISCONTINUED] ofloxacin (OCUFLOX) 0.3 % ophthalmic solution  (Patient not taking: Reported on 03/10/2021)   [DISCONTINUED] TRUEplus Lancets 33G MISC USE AS DIRECTED TO MONITOR FINGER STICK BLOOD SUGAR FOUR TIMES DAILY   No facility-administered encounter medications on file as of 06/08/2021.    ALLERGIES: Allergies  Allergen Reactions   Plavix [Clopidogrel] Hives    VACCINATION STATUS: Immunization History  Administered Date(s) Administered   Fluad Quad(high Dose 65+) 09/22/2019, 11/03/2020   Influenza Whole 10/05/2005, 12/17/2006   Influenza,inj,quad, With Preservative 11/25/2018   Moderna Sars-Covid-2 Vaccination 03/06/2019, 04/03/2019, 01/06/2020   Pneumococcal Conjugate-13 01/29/2020   Pneumococcal Polysaccharide-23 08/21/2005   Td 12/17/2006   Zoster Recombinat (Shingrix) 06/03/2021    Diabetes He presents for his initial diabetic visit. He has type 2 diabetes mellitus. Onset time: Diagnosed at approx age of 81. His disease course has been improving. Hypoglycemia symptoms include sweats and tremors. Associated symptoms include foot paresthesias. Hypoglycemia complications include nocturnal hypoglycemia. Symptoms are stable. Diabetic complications include heart disease, nephropathy, peripheral neuropathy and PVD. Risk factors for coronary artery disease include diabetes mellitus, dyslipidemia, family history, male sex and hypertension. Current diabetic treatment includes intensive insulin program and oral agent (dual therapy). He is compliant with treatment most of the time. His weight is stable. He is following a generally healthy diet. Meal planning includes avoidance of concentrated sweets. He has had a previous visit with a dietitian. He participates in exercise intermittently. His home blood glucose trend is decreasing steadily. His overall blood glucose range is 110-130 mg/dl. (He presents today, accompanied by his wife, for  his consultation with his CGM showing tight fasting and slightly above target postprandial readings.  His POCT A1c today is 7.6% improving drastically from last visit of 10.5%.  He drinks mostly water, with 1 cup of coffee in the morning with splenda and SF creamer.  He eats 3 meals per day, no snacks.  He engages in routine physical activity with yard work.  He is UTD on eye exam, and sees podiatry routinely.  Analysis of his CGM shows TIR 79%, TAR 15%, TBR 6% with a GMI of 6.4%.) An ACE inhibitor/angiotensin II receptor blocker is being taken. He sees a podiatrist.Eye exam is current.    Review of systems  Constitutional: + Minimally fluctuating body weight, current Body mass index is 25.65 kg/m., no fatigue, no subjective hyperthermia, no subjective hypothermia Eyes: no blurry vision, no xerophthalmia ENT: no sore throat, no nodules palpated in throat, no dysphagia/odynophagia, no hoarseness Cardiovascular: no chest pain, no shortness of breath, no palpitations, no leg swelling Respiratory: no cough, no shortness of breath Gastrointestinal: no nausea/vomiting/diarrhea Musculoskeletal: no muscle/joint aches, walks with cane Skin: no rashes, no hyperemia Neurological: no tremors, + numbness/tingling to BLE, no dizziness Psychiatric: no depression, no anxiety  Objective:     BP 130/71   Pulse 80   Ht 6\' 6"  (1.981 m)   Wt 222 lb (100.7 kg)   BMI 25.65 kg/m   Wt Readings from Last 3 Encounters:  06/08/21 222 lb (100.7 kg)  05/17/21 219 lb (99.3 kg)  05/12/21 222 lb (100.7 kg)     BP Readings from Last 3 Encounters:  06/08/21 130/71  05/17/21 136/68  05/13/21 139/87     Physical Exam- Limited  Constitutional:  Body mass index is 25.65 kg/m. , not in acute distress, normal state of mind Eyes:  EOMI, no exophthalmos Neck: Supple Cardiovascular: RRR, no murmurs, rubs, or gallops, no edema Respiratory: Adequate breathing efforts, no crackles, rales, rhonchi, or  wheezing Musculoskeletal: no gross deformities, strength intact in all four extremities, no gross restriction of joint movements, walks with cane Skin:  no rashes, no hyperemia Neurological: no tremor with outstretched hands    CMP ( most recent) CMP     Component Value Date/Time  NA 137 05/12/2021 2120   NA 143 02/07/2019 0000   K 5.2 (H) 05/13/2021 0216   CL 103 05/12/2021 2120   CO2 23 05/12/2021 2120   GLUCOSE 295 (H) 05/12/2021 2120   BUN 24 (H) 05/12/2021 2120   BUN 15 02/07/2019 0000   CREATININE 1.56 (H) 05/12/2021 2120   CREATININE 1.34 (H) 02/02/2021 0839   CALCIUM 10.1 05/12/2021 2120   PROT 7.5 05/12/2021 2120   ALBUMIN 4.0 05/12/2021 2120   AST 20 05/12/2021 2120   ALT 13 05/12/2021 2120   ALKPHOS 94 05/12/2021 2120   BILITOT 1.2 05/12/2021 2120   GFRNONAA 44 (L) 05/12/2021 2120   GFRNONAA 54 (L) 04/29/2020 1141   GFRAA 63 04/29/2020 1141     Diabetic Labs (most recent): Lab Results  Component Value Date   HGBA1C 7.6 06/08/2021   HGBA1C 10.5 (H) 02/02/2021   HGBA1C 9.8 (H) 11/03/2020     Lipid Panel ( most recent) Lipid Panel     Component Value Date/Time   CHOL 129 11/03/2020 1032   TRIG 82 11/03/2020 1032   HDL 40 11/03/2020 1032   CHOLHDL 3.2 11/03/2020 1032   VLDL 12 06/17/2008 0304   LDLCALC 73 11/03/2020 1032      Lab Results  Component Value Date   TSH 1.45 11/03/2020   TSH 1.75 03/31/2020   TSH 1.17 09/22/2019   TSH 0.94 03/19/2019   TSH 1.039 09/20/2017   TSH 0.26 (A) 03/27/2014   TSH 1.746 06/17/2008   TSH 1.028 12/18/2007   TSH 0.283 (L) 09/17/2006   TSH 1.002 05/10/2006   FREET4 1.3 09/22/2019   FREET4 1.3 03/19/2019   FREET4 1.32 07/31/2011   FREET4 1.09 06/17/2008   FREET4 1.31 12/18/2007   FREET4 1.27 05/10/2006           Assessment & Plan:   1) Type 2 diabetes mellitus with stage 3b chronic kidney disease, with long-term current use of insulin ([place])  He presents today, accompanied by his wife, for his  consultation with his CGM showing tight fasting and slightly above target postprandial readings.  His POCT A1c today is 7.6% improving drastically from last visit of 10.5%.  He drinks mostly water, with 1 cup of coffee in the morning with splenda and SF creamer.  He eats 3 meals per day, no snacks.  He engages in routine physical activity with yard work.  He is UTD on eye exam, and sees podiatry routinely.  Analysis of his CGM shows TIR 79%, TAR 15%, TBR 6% with a GMI of 6.4%.  - Dylan Robles has currently uncontrolled symptomatic type 2 DM since 81 years of age, with most recent A1c of 7.6 %.   -Recent labs reviewed.  - I had a long discussion with him about the progressive nature of diabetes and the pathology behind its complications. -his diabetes is complicated by CAD, CKD, PVD, neuropathy and he remains at a high risk for more acute and chronic complications which include CAD, CVA, CKD, retinopathy, and neuropathy. These are all discussed in detail with him.  The following Lifestyle Medicine recommendations according to [facility]) were discussed and offered to patient and he agrees to start the journey:  A. Whole Foods, Plant-based plate comprising of fruits and vegetables, plant-based proteins, whole-grain carbohydrates was discussed in detail with the patient.   A list for source of those nutrients were also provided to the patient.  Patient will use only water or unsweetened tea  for hydration. B.  The need to stay away from risky substances including alcohol, smoking; obtaining 7 to 9 hours of restorative sleep, at least 150 minutes of moderate intensity exercise weekly, the importance of healthy social connections,  and stress reduction techniques were discussed. C.  A full color page of  Calorie density of various food groups per pound showing examples of each food groups was provided to the patient.  - I have counseled him on diet and weight management by  adopting a carbohydrate restricted/protein rich diet. Patient is encouraged to switch to unprocessed or minimally processed complex starch and increased protein intake (animal or plant source), fruits, and vegetables. -  he is advised to stick to a routine mealtimes to eat 3 meals a day and avoid unnecessary snacks (to snack only to correct hypoglycemia).   - he acknowledges that there is a room for improvement in his food and drink choices. - Suggestion is made for him to avoid simple carbohydrates from his diet including Cakes, Sweet Desserts, Ice Cream, Soda (diet and regular), Sweet Tea, Candies, Chips, Cookies, Store Bought Juices, Alcohol in Excess of 1-2 drinks a day, Artificial Sweeteners, Coffee Creamer, and "Sugar-free" Products. This will help patient to have more stable blood glucose profile and potentially avoid unintended weight gain.  - I have approached him with the following individualized plan to manage his diabetes and patient agrees:   -He is advised to lower his Levemir to 22 units SQ nightly and adjust his Novolog to 4-10 units TID with meals if glucose is above 100 and he is eating (Specific instructions on how to titrate insulin dosage based on glucose readings given to patient in writing).  He and his wife demonstrated their ability to use the SSI provided to dose meal time injections properly.  -he is encouraged to start monitoring glucose 4 times daily (using his CGM), before meals and before bed, to log their readings on the clinic sheets provided, and bring them to review at follow up appointment in 2 weeks.  - he is warned not to take insulin without proper monitoring per orders. - Adjustment parameters are given to him for hypo and hyperglycemia in writing. - he is encouraged to call clinic for blood glucose levels less than 70 or above 300 mg /dl.  - his Glipizide will be discontinued, risk outweighs benefit for this patient- risk of hypoglycemia given advanced age and  CKD. - he is not a candidate for full dose Metformin due to concurrent renal insufficiency, I discontinued this today to de-escalate his treatment.  - he is not an ideal candidate for incretin therapy due to body habitus with BMI at 25.  - Specific targets for  A1c; LDL, HDL, and Triglycerides were discussed with the patient.  2) Blood Pressure /Hypertension:  his blood pressure is controlled to target.   he is advised to continue his current medications including Norvasc 5 mg p.o. daily with breakfast, Lasix 20 mg po daily as needed, Lisinopril 20 mg po daily and Metoprolol 50 mg po daily.  3) Lipids/Hyperlipidemia:    Review of his recent lipid panel from 11/03/20 showed controlled LDL at 73 .  he is advised to continue Lipitor 80 mg daily at bedtime and Zetia 10 mg po daily.  Side effects and precautions discussed with him.  4)  Weight/Diet:  his Body mass index is 25.65 kg/m.  -  he is NOT a candidate for weight loss.  Exercise, and detailed carbohydrates information provided  -  detailed on discharge instructions.  5) Hypothyroidism-unspecified The details surrounding his diagnosis are not available.  He is advised to continue his Levothyroxine 88 mcg po daily before breakfast.  Will recheck TFTs on subsequent visits.  - The correct intake of thyroid hormone (Levothyroxine, Synthroid), is on empty stomach first thing in the morning, with water, separated by at least 30 minutes from breakfast and other medications,  and separated by more than 4 hours from calcium, iron, multivitamins, acid reflux medications (PPIs).  - This medication is a life-long medication and will be needed to correct thyroid hormone imbalances for the rest of your life.  The dose may change from time to time, based on thyroid blood work.  - It is extremely important to be consistent taking this medication, near the same time each morning.  -AVOID TAKING PRODUCTS CONTAINING BIOTIN (commonly found in Hair, Skin,  Nails vitamins) AS IT INTERFERES WITH THE VALIDITY OF THYROID FUNCTION BLOOD TESTS.  6) Chronic Care/Health Maintenance: -he is on ACEI/ARB and Statin medications and is encouraged to initiate and continue to follow up with Ophthalmology, Dentist, Podiatrist at least yearly or according to recommendations, and advised to stay away from smoking. I have recommended yearly flu vaccine and pneumonia vaccine at least every 5 years; moderate intensity exercise for up to 150 minutes weekly; and sleep for at least 7 hours a day.  - he is advised to maintain close follow up with Renee Rival, FNP for primary care needs, as well as his other providers for optimal and coordinated care.   - Time spent in this patient care: 60 min, of which > 50% was spent in counseling him about his diabetes and the rest reviewing his blood glucose logs, discussing his hypoglycemia and hyperglycemia episodes, reviewing his current and previous labs/studies (including abstraction from other facilities) and medications doses and developing a long term treatment plan based on the latest standards of care/guidelines; and documenting his care.    Please refer to Patient Instructions for Blood Glucose Monitoring and Insulin/Medications Dosing Guide" in media tab for additional information. Please also refer to "Patient Self Inventory" in the Media tab for reviewed elements of pertinent patient history.  Dylan Robles participated in the discussions, expressed understanding, and voiced agreement with the above plans.  All questions were answered to his satisfaction. he is encouraged to contact clinic should he have any questions or concerns prior to his return visit.     Follow up plan: - Return in about 2 weeks (around 06/22/2021) for Diabetes F/U, Bring meter and logs.    Rayetta Pigg, [facility] [address] [place], [place] [address] Phone: [phone] Fax:  [phone]  06/08/2021, 3:36 PM

## 2021-06-08 NOTE — Patient Instructions (Signed)

## 2021-06-10 ENCOUNTER — Ambulatory Visit (INDEPENDENT_AMBULATORY_CARE_PROVIDER_SITE_OTHER): Payer: Medicare HMO | Admitting: Nurse Practitioner

## 2021-06-10 ENCOUNTER — Encounter: Payer: Self-pay | Admitting: Nurse Practitioner

## 2021-06-10 VITALS — BP 136/78 | HR 78 | Ht 78.0 in | Wt 223.0 lb

## 2021-06-10 DIAGNOSIS — E1169 Type 2 diabetes mellitus with other specified complication: Secondary | ICD-10-CM

## 2021-06-10 DIAGNOSIS — N1831 Chronic kidney disease, stage 3a: Secondary | ICD-10-CM

## 2021-06-10 DIAGNOSIS — I10 Essential (primary) hypertension: Secondary | ICD-10-CM | POA: Diagnosis not present

## 2021-06-10 DIAGNOSIS — E039 Hypothyroidism, unspecified: Secondary | ICD-10-CM

## 2021-06-10 DIAGNOSIS — E663 Overweight: Secondary | ICD-10-CM | POA: Insufficient documentation

## 2021-06-10 DIAGNOSIS — E782 Mixed hyperlipidemia: Secondary | ICD-10-CM | POA: Diagnosis not present

## 2021-06-10 DIAGNOSIS — E1142 Type 2 diabetes mellitus with diabetic polyneuropathy: Secondary | ICD-10-CM | POA: Diagnosis not present

## 2021-06-10 DIAGNOSIS — E785 Hyperlipidemia, unspecified: Secondary | ICD-10-CM

## 2021-06-10 MED ORDER — GABAPENTIN 300 MG PO CAPS: 300.0000 mg | ORAL_CAPSULE | Freq: Three times a day (TID) | ORAL | 1 refills | Status: DC

## 2021-06-10 MED ORDER — GVOKE HYPOPEN 2-PACK 1 MG/0.2ML ~~LOC~~ SOAJ: 0.5000 mg | Freq: Every day | SUBCUTANEOUS | 1 refills | Status: DC | PRN

## 2021-06-10 MED ORDER — GVOKE HYPOPEN 2-PACK 1 MG/0.2ML ~~LOC~~ SOAJ
1.0000 mg | Freq: Every day | SUBCUTANEOUS | 1 refills | Status: DC | PRN
Start: 2021-06-10 — End: 2021-06-17

## 2021-06-10 NOTE — Patient Instructions (Addendum)
Please use your Gvoke hypo pen glucagon '1mg'$  injection as needed for  hypoglycemia .   It is important that you exercise regularly at least 30 minutes 5 times a week.  Think about what you will eat, plan ahead. Choose " clean, green, fresh or frozen" over canned, processed or packaged foods which are more sugary, salty and fatty. 70 to 75% of food eaten should be vegetables and fruit. Three meals at set times with snacks allowed between meals, but they must be fruit or vegetables. Aim to eat over a 12 hour period , example 7 am to 7 pm, and STOP after  your last meal of the day. Drink water,generally about 64 ounces per day, no other drink is as healthy. Fruit juice is best enjoyed in a healthy way, by EATING the fruit.  Thanks for choosing [facility], we consider it a privelige to serve you.

## 2021-06-10 NOTE — Assessment & Plan Note (Signed)
BP Readings from Last 3 Encounters:  06/10/21 136/78  06/08/21 130/71  05/17/21 136/68  Chronic condition well-controlled on amlodipine 5 mg daily, isosorbide 30 mg daily, metoprolol 50 mg daily, lisinopril 20 mg daily Continue current medications DASH diet advised engage in regular daily exercises at least for 50 minutes weekly.

## 2021-06-10 NOTE — Assessment & Plan Note (Signed)
Currently on levothyroxine 88 mcg tablets daily TSH ordered

## 2021-06-10 NOTE — Assessment & Plan Note (Signed)
Chronic condition well-controlled on gabapentin 300 mg 3 times daily Medication refill today

## 2021-06-10 NOTE — Assessment & Plan Note (Signed)
Lab Results  Component Value Date   HGBA1C 7.6 06/08/2021   Patient congratulated on his recent A1c readings States that he has stopped drinking soda now drinking only water Reports taking Levemir 22 units at bedtime, NovoLog sliding scale 3 times daily. Patient has established care with endocrinologist,  Glucagon 1 mg injection ordered to be taken  as needed hypoglycemia. Has freestyle libre in place Patient encouraged to call the endocrinologist office if he is blood sugar consistently drops below 70.He verbalized understanding. Patient encouraged to maintain close follow-up with endocrinologist

## 2021-06-10 NOTE — Assessment & Plan Note (Deleted)
Lab Results  Component Value Date   HGBA1C 7.6 06/08/2021  Patient congratulated on his recent A1c readings States that he has stopped drinking soda now drinking only water Reports taking Levemir 22 units at bedtime, NovoLog sliding scale 3 times daily. Patient has established care with endocrinologist,  Glucagon 1 mg injection ordered to be taken  as needed hypoglycemia. Has freestyle libre in place Patient encouraged to call the endocrinologist office if he is blood sugar consistently drops below 70.He verbalized understanding. Patient encouraged to maintain close follow-up with endocrinologist

## 2021-06-10 NOTE — Progress Notes (Deleted)
   Dylan Robles     MRN: [id number]      DOB: 04-19-1940   HPI Dylan Robles is here for follow up and re-evaluation of chronic medical conditions, medication management and review of any available recent lab and radiology data.  Preventive health is updated, specifically  Cancer screening and Immunization.   Questions or concerns regarding consultations or procedures which the PT has had in the interim are  addressed. The PT denies any adverse reactions to current medications since the last visit.  There are no new concerns.  There are no specific complaints   He has stopped drinking Soda, now drinking more water .        ROS Denies recent fever or chills. Denies sinus pressure, nasal congestion, ear pain or sore throat. Denies chest congestion, productive cough or wheezing. Denies chest pains, palpitations and leg swelling Denies abdominal pain, nausea, vomiting,diarrhea or constipation.   Denies dysuria, frequency, hesitancy or incontinence. Denies joint pain, swelling and limitation in mobility. Denies headaches, seizures, numbness, or tingling. Denies depression, anxiety or insomnia. Denies skin break down or rash.   PE  BP 136/78 (BP Location: Left Arm, Cuff Size: Normal)   Pulse 78   Ht '6\' 6"'$  (1.981 m)   Wt 223 lb (101.2 kg)   SpO2 96%   BMI 25.77 kg/m   Patient alert and oriented and in no cardiopulmonary distress.  HEENT: No facial asymmetry, EOMI,     Neck supple .  Chest: Clear to auscultation bilaterally.  CVS: S1, S2 no murmurs, no S3.Regular rate.  ABD: Soft non tender.   Ext: No edema  MS: Adequate ROM spine, shoulders, hips and knees.  Skin: Intact, no ulcerations or rash noted.  Psych: Good eye contact, normal affect. Memory intact not anxious or depressed appearing.  CNS: CN 2-12 intact, power,  normal throughout.no focal deficits noted.   Assessment & Plan  ***

## 2021-06-10 NOTE — Assessment & Plan Note (Addendum)
Lab Results  Component Value Date   NA 137 05/12/2021   K 5.2 (H) 05/13/2021   CO2 23 05/12/2021   BUN 24 (H) 05/12/2021   CREATININE 1.56 (H) 05/12/2021   CALCIUM 10.1 05/12/2021   GLUCOSE 295 (H) 05/12/2021  Chronic stable condition Avoid NSAIDs drink at least 64 ounces of water daily On lisinopril 20 mg daily We will recheck labs at next follow-up appointment in 5 months

## 2021-06-10 NOTE — Assessment & Plan Note (Addendum)
Currently on atorvastatin 80 mg daily, Zetia 10 mg daily,, fish oil 1000 mg daily LDL goal is less than 55 due to history of PVD, CAD  Fasting lipid ordered today.  Has upcoming appointment with cardiology.

## 2021-06-10 NOTE — Assessment & Plan Note (Signed)
Wt Readings from Last 3 Encounters:  06/10/21 223 lb (101.2 kg)  06/08/21 222 lb (100.7 kg)  05/17/21 219 lb (99.3 kg)  Patient counseled on low-carb diet, encouraged to engage in regular walking exercises at least 150 minutes weekly as tolerated

## 2021-06-10 NOTE — Progress Notes (Signed)
Dylan Robles     MRN: [id number]      DOB: [birth year]   HPI Dylan Robles with past medical history of essential hypertension, type 2 diabetes, hypothyroidism, diabetic neuropathy, hyperlipidemia, stage III CKD is here for follow up and re-evaluation of chronic medical conditions, medication management and review of any available recent lab.   Type 2 diabetes.  Currently on NovoLog sliding scale, Levemir 22 units at bedtime, patient reports that he has stopped drinking soda now drinking more water, has freestyle libre for blood sugar monitoring, reports one  hypoglycemic episode reading of 69, takes orange juice as needed.   Hypertension.  Currently on lisinopril 20 mg daily, metoprolol 50 mg daily, isosorbide 30 mg daily amlodipine 5 mg daily, denies chest pain, edema, HA  Up-to-date with his vaccines.  Patient denies any adverse reactions to current medication.    ROS Denies recent fever or chills. Denies sinus pressure, nasal congestion, ear pain or sore throat. Denies chest congestion, productive cough or wheezing. Denies chest pains, palpitations and leg swelling Denies abdominal pain, nausea, vomiting,diarrhea or constipation.   Denies dysuria, frequency, hesitancy or incontinence. Denies joint pain, swelling and limitation in mobility. Denies depression, anxiety or insomnia.    PE  BP 136/78 (BP Location: Left Arm, Cuff Size: Normal)   Pulse 78   Ht '6\' 6"'$  (1.981 m)   Wt 223 lb (101.2 kg)   SpO2 96%   BMI 25.77 kg/m   Patient alert and oriented and in no cardiopulmonary distress.   Chest: Clear to auscultation bilaterally.  CVS: S1, S2 no murmurs, no S3.Regular rate.  ABD: Soft non tender.   Ext: No edema  MS: Adequate ROM spine, shoulders, hips and knees, using a cane  Psych: Good eye contact, normal affect. Memory intact not anxious or depressed appearing.  CNS: CN 2-12 intact, power,  normal throughout.no focal deficits noted.   Assessment &  Plan  Essential hypertension BP Readings from Last 3 Encounters:  06/10/21 136/78  06/08/21 130/71  05/17/21 136/68  Chronic condition well-controlled on amlodipine 5 mg daily, isosorbide 30 mg daily, metoprolol 50 mg daily, lisinopril 20 mg daily Continue current medications DASH diet advised engage in regular daily exercises at least for 50 minutes weekly.  Hypothyroidism Currently on levothyroxine 88 mcg tablets daily TSH ordered  Type 2 diabetes mellitus with hyperlipidemia ([place]) Lab Results  Component Value Date   HGBA1C 7.6 06/08/2021   Patient congratulated on his recent A1c readings States that he has stopped drinking soda now drinking only water Reports taking Levemir 22 units at bedtime, NovoLog sliding scale 3 times daily. Patient has established care with endocrinologist,  Glucagon 1 mg injection ordered to be taken  as needed hypoglycemia. Has freestyle libre in place Patient encouraged to call the endocrinologist office if he is blood sugar consistently drops below 70.He verbalized understanding. Patient encouraged to maintain close follow-up with endocrinologist  Diabetic neuropathy ([place]) Chronic condition well-controlled on gabapentin 300 mg 3 times daily Medication refill today  Stage 3a chronic kidney disease ([address]) Lab Results  Component Value Date   NA 137 05/12/2021   K 5.2 (H) 05/13/2021   CO2 23 05/12/2021   BUN 24 (H) 05/12/2021   CREATININE 1.56 (H) 05/12/2021   CALCIUM 10.1 05/12/2021   GLUCOSE 295 (H) 05/12/2021  Chronic stable condition Avoid NSAIDs drink at least 64 ounces of water daily On lisinopril 20 mg daily We will recheck labs at next follow-up appointment in 5 months  Hyperlipidemia Currently on atorvastatin 80 mg daily, Zetia 10 mg daily,, fish oil 1000 mg daily LDL goal is less than 55 due to history of PVD, CAD  Fasting lipid ordered today.  Has upcoming appointment with cardiology.  Overweight (BMI 25.0-29.9) Wt Readings  from Last 3 Encounters:  06/10/21 223 lb (101.2 kg)  06/08/21 222 lb (100.7 kg)  05/17/21 219 lb (99.3 kg)  Patient counseled on low-carb diet, encouraged to engage in regular walking exercises at least 150 minutes weekly as tolerated

## 2021-06-13 DIAGNOSIS — I2510 Atherosclerotic heart disease of native coronary artery without angina pectoris: Secondary | ICD-10-CM | POA: Diagnosis not present

## 2021-06-13 DIAGNOSIS — E785 Hyperlipidemia, unspecified: Secondary | ICD-10-CM | POA: Diagnosis not present

## 2021-06-13 DIAGNOSIS — I10 Essential (primary) hypertension: Secondary | ICD-10-CM | POA: Diagnosis not present

## 2021-06-13 DIAGNOSIS — I340 Nonrheumatic mitral (valve) insufficiency: Secondary | ICD-10-CM | POA: Diagnosis not present

## 2021-06-13 DIAGNOSIS — E119 Type 2 diabetes mellitus without complications: Secondary | ICD-10-CM | POA: Diagnosis not present

## 2021-06-14 DIAGNOSIS — E782 Mixed hyperlipidemia: Secondary | ICD-10-CM | POA: Diagnosis not present

## 2021-06-14 DIAGNOSIS — E039 Hypothyroidism, unspecified: Secondary | ICD-10-CM | POA: Diagnosis not present

## 2021-06-15 LAB — LIPID PANEL
Chol/HDL Ratio: 3.1 ratio (ref 0.0–5.0)
Cholesterol, Total: 101 mg/dL (ref 100–199)
HDL: 33 mg/dL — ABNORMAL LOW (ref >39)
LDL Chol Calc (NIH): 55 mg/dL (ref 0–99)
Triglycerides: 53 mg/dL (ref 0–149)
VLDL Cholesterol Cal: 13 mg/dL (ref 5–40)

## 2021-06-15 LAB — TSH: TSH: 1.79 u[IU]/mL (ref 0.450–4.500)

## 2021-06-15 NOTE — Progress Notes (Signed)
Labs are normal , continue current meds.

## 2021-06-16 ENCOUNTER — Ambulatory Visit (INDEPENDENT_AMBULATORY_CARE_PROVIDER_SITE_OTHER): Payer: PRIVATE HEALTH INSURANCE | Admitting: Podiatry

## 2021-06-16 ENCOUNTER — Encounter: Payer: Self-pay | Admitting: Podiatry

## 2021-06-16 DIAGNOSIS — L608 Other nail disorders: Secondary | ICD-10-CM

## 2021-06-16 DIAGNOSIS — E1142 Type 2 diabetes mellitus with diabetic polyneuropathy: Secondary | ICD-10-CM | POA: Diagnosis not present

## 2021-06-16 DIAGNOSIS — M79675 Pain in left toe(s): Secondary | ICD-10-CM

## 2021-06-16 DIAGNOSIS — B351 Tinea unguium: Secondary | ICD-10-CM

## 2021-06-16 DIAGNOSIS — I739 Peripheral vascular disease, unspecified: Secondary | ICD-10-CM | POA: Diagnosis not present

## 2021-06-16 DIAGNOSIS — M79674 Pain in right toe(s): Secondary | ICD-10-CM | POA: Diagnosis not present

## 2021-06-16 NOTE — Progress Notes (Signed)
This patient returns to my office for at risk foot care.  This patient requires this care by a professional since this patient will be at risk due to having diabetes and PVD.  This patient is unable to cut nails himself since the patient cannot reach his nails.These nails are painful walking and wearing shoes.  This patient presents for at risk foot care today.  General Appearance  Alert, conversant and in no acute stress.  Vascular  Dorsalis pedis and posterior tibial  pulses are weakly palpable  bilaterally.  Capillary return is within normal limits  bilaterally. Cold feet  Bilaterally. Absent digital hair.  Swelling foot 1/4  Neurologic  Senn-Weinstein monofilament wire test within normal limits right..LOPS absent left foot. Muscle power within normal limits bilaterally.  Nails Thick disfigured discolored nails with subungual debris  from hallux to fifth toes bilaterally. No evidence of bacterial infection or drainage bilaterally.  Orthopedic  No limitations of motion  feet .  No crepitus or effusions noted.  No bony pathology or digital deformities noted.  Skin  normotropic skin with no porokeratosis noted bilaterally.  No signs of infections or ulcers noted.     Onychomycosis  Pain in right toes  Pain in left toes  Consent was obtained for treatment procedures.   Mechanical debridement of nails 1-5  bilaterally performed with a nail nipper.  Filed with dremel without incident.    Return office visit   3 months                   Told patient to return for periodic foot care and evaluation due to potential at risk complications.   Wilton Thrall DPM  

## 2021-06-17 ENCOUNTER — Other Ambulatory Visit: Payer: Self-pay | Admitting: Nurse Practitioner

## 2021-06-17 MED ORDER — GVOKE HYPOPEN 2-PACK 1 MG/0.2ML ~~LOC~~ SOAJ: 1.0000 mg | Freq: Every day | SUBCUTANEOUS | 1 refills | Status: DC | PRN

## 2021-06-28 ENCOUNTER — Encounter: Payer: Self-pay | Admitting: Nurse Practitioner

## 2021-06-28 ENCOUNTER — Ambulatory Visit (INDEPENDENT_AMBULATORY_CARE_PROVIDER_SITE_OTHER): Payer: PRIVATE HEALTH INSURANCE | Admitting: Nurse Practitioner

## 2021-06-28 VITALS — BP 121/73 | HR 75 | Ht 78.0 in | Wt 221.0 lb

## 2021-06-28 DIAGNOSIS — E039 Hypothyroidism, unspecified: Secondary | ICD-10-CM | POA: Diagnosis not present

## 2021-06-28 DIAGNOSIS — E1122 Type 2 diabetes mellitus with diabetic chronic kidney disease: Secondary | ICD-10-CM | POA: Diagnosis not present

## 2021-06-28 DIAGNOSIS — Z794 Long term (current) use of insulin: Secondary | ICD-10-CM

## 2021-06-28 DIAGNOSIS — N1832 Chronic kidney disease, stage 3b: Secondary | ICD-10-CM | POA: Diagnosis not present

## 2021-06-28 NOTE — Progress Notes (Signed)
Endocrinology Follow Up Note       06/28/2021, 3:47 PM   Subjective:    Patient ID: Dylan Robles, male    DOB: Jan 23, 1940.  Dylan Robles is being seen in follow up after being seen in consultation for management of currently uncontrolled symptomatic diabetes requested by  Renee Rival, FNP.   Past Medical History:  Diagnosis Date   Acute lower UTI 09/20/2017   Bradycardia on ECG 07/31/2011   Coronary artery disease    hyperlipidemia   Decreased GFR 09/23/2019   Dehydration 09/21/2017   Diabetes mellitus    Diabetes mellitus without complication ([place])    Hypercholesterolemia    Hypertension    Hypothyroidism    Left arm pain 01/29/2020   Paronychia of great toe of left foot 09/26/2018   Paronychia of great toe, left 09/26/2018   PERIPHERAL NEUROPATHY 01/13/2006   Qualifier: Diagnosis of  By: Truett Mainland MD, Christine     Shortness of breath dyspnea    with exertion   Vasovagal syncope    Weakness 09/20/2017    Past Surgical History:  Procedure Laterality Date   APPENDECTOMY     CARDIAC CATHETERIZATION N/A 06/16/2014   Procedure: Left Heart Cath;  Surgeon: Dionisio David, MD;  Location: [facility];  Service: Cardiovascular;  Laterality: N/A;   CIRCUMCISION, NON-NEWBORN     HERNIA REPAIR     LOWER EXTREMITY ANGIOGRAPHY Left 12/24/2018   Procedure: LOWER EXTREMITY ANGIOGRAPHY;  Surgeon: Katha Cabal, MD;  Location: [facility];  Service: Cardiovascular;  Laterality: Left;   THYROID SURGERY      Social History   Socioeconomic History   Marital status: Married    Spouse name: Not on file   Number of children: Not on file   Years of education: Not on file   Highest education level: Not on file  Occupational History   Occupation: Truck Geophysicist/field seismologist    Comment: Retired  Tobacco Use   Smoking status: Former   Smokeless tobacco: Never  Scientific laboratory technician Use: Never used  Substance and  Sexual Activity   Alcohol use: No   Drug use: No   Sexual activity: Not on file  Other Topics Concern   Not on file  Social History Narrative   ** Merged History Encounter **       Social Determinants of Health   Financial Resource Strain: Low Risk  (03/29/2021)   Overall Financial Resource Strain (CARDIA)    Difficulty of Paying Living Expenses: Not very hard  Food Insecurity: No Food Insecurity (03/29/2021)   Hunger Vital Sign    Worried About Running Out of Food in the Last Year: Never true    Ran Out of Food in the Last Year: Never true  Transportation Needs: No Transportation Needs (03/29/2021)   PRAPARE - Hydrologist (Medical): No    Lack of Transportation (Non-Medical): No  Physical Activity: Insufficiently Active (03/29/2021)   Exercise Vital Sign    Days of Exercise per Week: 7 days    Minutes of Exercise per Session: 20 min  Stress: No Stress Concern Present (03/29/2021)   [place]  [facility]    Feeling of Stress : Not at all  Social Connections: Socially Integrated (03/29/2021)   Social Connection and Isolation Panel [NHANES]    Frequency of Communication with Friends and Family: More than three times a week    Frequency of Social Gatherings with Friends and Family: More than three times a week    Attends Religious Services: More than 4 times per year    Active Member of [employer] or Organizations: Yes    Attends Archivist Meetings: 1 to 4 times per year    Marital Status: Married    Family History  Problem Relation Age of Onset   Diabetes Father    Hypertension Father    Cancer Mother    Diabetes Brother     Outpatient Encounter Medications as of 06/28/2021  Medication Sig   amLODipine (NORVASC) 5 MG tablet TAKE 1 TABLET EVERY DAY   aspirin EC 81 MG tablet Take 81 mg by mouth daily.    atorvastatin (LIPITOR) 80 MG tablet TAKE 1 TABLET AT BEDTIME.   BRILINTA 60 MG TABS  tablet Take 60 mg by mouth 2 (two) times daily.   cetirizine (ZYRTEC) 5 MG tablet Take 1 tablet (5 mg total) by mouth daily.   ezetimibe (ZETIA) 10 MG tablet Take 10 mg by mouth daily.   Fish Oil-Vitamin D 1000-1000 MG-UNIT CAPS Take by mouth once. daily   furosemide (LASIX) 20 MG tablet Take 20 mg by mouth as needed.   gabapentin (NEURONTIN) 300 MG capsule Take 1 capsule (300 mg total) by mouth 3 (three) times daily.   Glucagon (GVOKE HYPOPEN 2-PACK) 1 MG/0.2ML SOAJ Inject 1 mg into the skin daily as needed.   insulin aspart (NOVOLOG) 100 UNIT/ML injection Inject 2-8 Units into the skin 3 (three) times daily before meals.   isosorbide mononitrate (IMDUR) 30 MG 24 hr tablet TAKE 1 TABLET EVERY DAY   LEVEMIR FLEXTOUCH 100 UNIT/ML Pen Inject 20 Units into the skin at bedtime.   levothyroxine (SYNTHROID) 88 MCG tablet TAKE 1 TABLET DAILY BEFORE BREAKFAST.   lisinopril (ZESTRIL) 20 MG tablet Take 1 tablet (20 mg total) by mouth daily.   metoprolol succinate (TOPROL-XL) 50 MG 24 hr tablet Take 1 tablet (50 mg total) by mouth daily.   tamsulosin (FLOMAX) 0.4 MG CAPS capsule TAKE 1 CAPSULE AT BEDTIME   No facility-administered encounter medications on file as of 06/28/2021.    ALLERGIES: Allergies  Allergen Reactions   Plavix [Clopidogrel] Hives    VACCINATION STATUS: Immunization History  Administered Date(s) Administered   Fluad Quad(high Dose 65+) 09/22/2019, 11/03/2020   Influenza Whole 10/05/2005, 12/17/2006   Influenza,inj,quad, With Preservative 11/25/2018   Moderna Sars-Covid-2 Vaccination 03/06/2019, 04/03/2019, 01/06/2020   Pneumococcal Conjugate-13 01/29/2020   Pneumococcal Polysaccharide-23 08/21/2005   Td 12/17/2006   Tdap 06/10/2021   Zoster Recombinat (Shingrix) 06/03/2021    Diabetes He presents for his follow-up diabetic visit. He has type 2 diabetes mellitus. Onset time: Diagnosed at approx age of 58. His disease course has been stable. There are no hypoglycemic  associated symptoms. Associated symptoms include foot paresthesias. There are no hypoglycemic complications. Symptoms are stable. Diabetic complications include heart disease, nephropathy, peripheral neuropathy and PVD. Risk factors for coronary artery disease include diabetes mellitus, dyslipidemia, family history, male sex and hypertension. Current diabetic treatment includes intensive insulin program. He is compliant with treatment most of the time. His weight is fluctuating minimally. He is following a generally healthy  diet. Meal planning includes avoidance of concentrated sweets. He has had a previous visit with a dietitian. He participates in exercise intermittently. His home blood glucose trend is decreasing steadily. His overall blood glucose range is 180-200 mg/dl. (He presents today, accompanied by his wife, with his CGM showing stable, fluctuating glycemic profile overall.  He was not due for another A1c today.  He has had less hypoglycemia since adjusting his meds at last visit.  Analysis of his CGM shows TIR 53%, TAR 47%, TBR 0% with a GMI of 7.7%.  He has worked hard to cut out sodas and is seeing the benefit in his glucose readings.) An ACE inhibitor/angiotensin II receptor blocker is being taken. He sees a podiatrist.Eye exam is current.     Review of systems  Constitutional: + Minimally fluctuating body weight, current Body mass index is 25.54 kg/m., no fatigue, no subjective hyperthermia, no subjective hypothermia Eyes: no blurry vision, no xerophthalmia ENT: no sore throat, no nodules palpated in throat, no dysphagia/odynophagia, no hoarseness Cardiovascular: no chest pain, no shortness of breath, no palpitations, no leg swelling Respiratory: no cough, no shortness of breath Gastrointestinal: no nausea/vomiting/diarrhea Musculoskeletal: no muscle/joint aches, walks with cane Skin: no rashes, no hyperemia Neurological: no tremors, + numbness/tingling to BLE, no  dizziness Psychiatric: no depression, no anxiety  Objective:     BP 121/73   Pulse 75   Ht '6\' 6"'$  (1.981 m)   Wt 221 lb (100.2 kg)   BMI 25.54 kg/m   Wt Readings from Last 3 Encounters:  06/28/21 221 lb (100.2 kg)  06/10/21 223 lb (101.2 kg)  06/08/21 222 lb (100.7 kg)     BP Readings from Last 3 Encounters:  06/28/21 121/73  06/10/21 136/78  06/08/21 130/71     Physical Exam- Limited  Constitutional:  Body mass index is 25.54 kg/m. , not in acute distress, normal state of mind Eyes:  EOMI, no exophthalmos Neck: Supple Cardiovascular: RRR, no murmurs, rubs, or gallops, no edema Respiratory: Adequate breathing efforts, no crackles, rales, rhonchi, or wheezing Musculoskeletal: no gross deformities, strength intact in all four extremities, no gross restriction of joint movements, walks with cane Skin:  no rashes, no hyperemia Neurological: no tremor with outstretched hands    CMP ( most recent) CMP     Component Value Date/Time   NA 137 05/12/2021 2120   NA 143 02/07/2019 0000   K 5.2 (H) 05/13/2021 0216   CL 103 05/12/2021 2120   CO2 23 05/12/2021 2120   GLUCOSE 295 (H) 05/12/2021 2120   BUN 24 (H) 05/12/2021 2120   BUN 15 02/07/2019 0000   CREATININE 1.56 (H) 05/12/2021 2120   CREATININE 1.34 (H) 02/02/2021 0839   CALCIUM 10.1 05/12/2021 2120   PROT 7.5 05/12/2021 2120   ALBUMIN 4.0 05/12/2021 2120   AST 20 05/12/2021 2120   ALT 13 05/12/2021 2120   ALKPHOS 94 05/12/2021 2120   BILITOT 1.2 05/12/2021 2120   GFRNONAA 44 (L) 05/12/2021 2120   GFRNONAA 54 (L) 04/29/2020 1141   GFRAA 63 04/29/2020 1141     Diabetic Labs (most recent): Lab Results  Component Value Date   HGBA1C 7.6 06/08/2021   HGBA1C 10.5 (H) 02/02/2021   HGBA1C 9.8 (H) 11/03/2020   MICROALBUR 5.0 02/02/2021   MICROALBUR 1.4 09/22/2019   MICROALBUR 0.67 12/17/2006     Lipid Panel ( most recent) Lipid Panel     Component Value Date/Time   CHOL 101 06/14/2021 0916   TRIG 53  06/14/2021 0916   HDL 33 (L) 06/14/2021 0916   CHOLHDL 3.1 06/14/2021 0916   CHOLHDL 3.2 11/03/2020 1032   VLDL 12 06/17/2008 0304   LDLCALC 55 06/14/2021 0916   LDLCALC 73 11/03/2020 1032   LABVLDL 13 06/14/2021 0916      Lab Results  Component Value Date   TSH 1.790 06/14/2021   TSH 1.45 11/03/2020   TSH 1.75 03/31/2020   TSH 1.17 09/22/2019   TSH 0.94 03/19/2019   TSH 1.039 09/20/2017   TSH 0.26 (A) 03/27/2014   TSH 1.746 06/17/2008   TSH 1.028 12/18/2007   TSH 0.283 (L) 09/17/2006   FREET4 1.3 09/22/2019   FREET4 1.3 03/19/2019   FREET4 1.32 07/31/2011   FREET4 1.09 06/17/2008   FREET4 1.31 12/18/2007   FREET4 1.27 05/10/2006           Assessment & Plan:   1) Type 2 diabetes mellitus with stage 3b chronic kidney disease, with long-term current use of insulin ([place])  He presents today, accompanied by his wife, with his CGM showing stable, fluctuating glycemic profile overall.  He was not due for another A1c today.  He has had less hypoglycemia since adjusting his meds at last visit.  Analysis of his CGM shows TIR 53%, TAR 47%, TBR 0% with a GMI of 7.7%.  He has worked hard to cut out sodas and is seeing the benefit in his glucose readings.  - Dylan Robles has currently uncontrolled symptomatic type 2 DM since 81 years of age, with most recent A1c of 7.6 %.   -Recent labs reviewed.  - I had a long discussion with him about the progressive nature of diabetes and the pathology behind its complications. -his diabetes is complicated by CAD, CKD, PVD, neuropathy and he remains at a high risk for more acute and chronic complications which include CAD, CVA, CKD, retinopathy, and neuropathy. These are all discussed in detail with him.  The following Lifestyle Medicine recommendations according to [facility]) were discussed and offered to patient and he agrees to start the journey:  A. Whole Foods, Plant-based plate comprising of fruits and  vegetables, plant-based proteins, whole-grain carbohydrates was discussed in detail with the patient.   A list for source of those nutrients were also provided to the patient.  Patient will use only water or unsweetened tea for hydration. B.  The need to stay away from risky substances including alcohol, smoking; obtaining 7 to 9 hours of restorative sleep, at least 150 minutes of moderate intensity exercise weekly, the importance of healthy social connections,  and stress reduction techniques were discussed. C.  A full color page of  Calorie density of various food groups per pound showing examples of each food groups was provided to the patient.  - Nutritional counseling repeated at each appointment due to patients tendency to fall back in to old habits.  - The patient admits there is a room for improvement in their diet and drink choices. -  Suggestion is made for the patient to avoid simple carbohydrates from their diet including Cakes, Sweet Desserts / Pastries, Ice Cream, Soda (diet and regular), Sweet Tea, Candies, Chips, Cookies, Sweet Pastries, Store Bought Juices, Alcohol in Excess of 1-2 drinks a day, Artificial Sweeteners, Coffee Creamer, and "Sugar-free" Products. This will help patient to have stable blood glucose profile and potentially avoid unintended weight gain.   - I encouraged the patient to switch to unprocessed or minimally processed complex starch and  increased protein intake (animal or plant source), fruits, and vegetables.   - Patient is advised to stick to a routine mealtimes to eat 3 meals a day and avoid unnecessary snacks (to snack only to correct hypoglycemia).  - I have approached him with the following individualized plan to manage his diabetes and patient agrees:   -He is advised to lower his Levemir to 20 units SQ nightly and adjust his Novolog to 2-8 units TID with meals if glucose is above 90 and he is eating (Specific instructions on how to titrate insulin dosage  based on glucose readings given to patient in writing).    -he is encouraged to continue monitoring glucose 4 times daily (using his CGM), before meals and before bed, and to call the clinic if he has readings less than 70 or above 300 for 3 tests in a row.  - he is warned not to take insulin without proper monitoring per orders. - Adjustment parameters are given to him for hypo and hyperglycemia in writing.  - his Glipizide was be discontinued, risk outweighs benefit for this patient- risk of hypoglycemia given advanced age and CKD. - he is not a candidate for full dose Metformin due to concurrent renal insufficiency, I discontinued this at last visit to de-escalate his treatment.  - he is not an ideal candidate for incretin therapy due to body habitus with BMI at 25.  - Specific targets for  A1c; LDL, HDL, and Triglycerides were discussed with the patient.  2) Blood Pressure /Hypertension:  his blood pressure is controlled to target.   he is advised to continue his current medications including Norvasc 5 mg p.o. daily with breakfast, Lasix 20 mg po daily as needed, Lisinopril 20 mg po daily and Metoprolol 50 mg po daily.  3) Lipids/Hyperlipidemia:    Review of his recent lipid panel from 11/03/20 showed controlled LDL at 73 .  he is advised to continue Lipitor 80 mg daily at bedtime and Zetia 10 mg po daily.  Side effects and precautions discussed with him.  4)  Weight/Diet:  his Body mass index is 25.54 kg/m.  -  he is NOT a candidate for weight loss.  Exercise, and detailed carbohydrates information provided  -  detailed on discharge instructions.  5) Hypothyroidism-unspecified The details surrounding his diagnosis are not available.  He is advised to continue his Levothyroxine 88 mcg po daily before breakfast.  Will recheck TFTs on subsequent visits.  - The correct intake of thyroid hormone (Levothyroxine, Synthroid), is on empty stomach first thing in the morning, with water,  separated by at least 30 minutes from breakfast and other medications,  and separated by more than 4 hours from calcium, iron, multivitamins, acid reflux medications (PPIs).  - This medication is a life-long medication and will be needed to correct thyroid hormone imbalances for the rest of your life.  The dose may change from time to time, based on thyroid blood work.  - It is extremely important to be consistent taking this medication, near the same time each morning.  -AVOID TAKING PRODUCTS CONTAINING BIOTIN (commonly found in Hair, Skin, Nails vitamins) AS IT INTERFERES WITH THE VALIDITY OF THYROID FUNCTION BLOOD TESTS.  6) Chronic Care/Health Maintenance: -he is on ACEI/ARB and Statin medications and is encouraged to initiate and continue to follow up with Ophthalmology, Dentist, Podiatrist at least yearly or according to recommendations, and advised to stay away from smoking. I have recommended yearly flu vaccine and pneumonia vaccine at least  every 5 years; moderate intensity exercise for up to 150 minutes weekly; and sleep for at least 7 hours a day.  - he is advised to maintain close follow up with Renee Rival, FNP for primary care needs, as well as his other providers for optimal and coordinated care.     I spent 30 minutes in the care of the patient today including review of labs from [place], Lipids, Thyroid Function, Hematology (current and previous including abstractions from other facilities); face-to-face time discussing  his blood glucose readings/logs, discussing hypoglycemia and hyperglycemia episodes and symptoms, medications doses, his options of short and long term treatment based on the latest standards of care / guidelines;  discussion about incorporating lifestyle medicine;  and documenting the encounter.    Please refer to Patient Instructions for Blood Glucose Monitoring and Insulin/Medications Dosing Guide"  in media tab for additional information. Please  also refer  to " Patient Self Inventory" in the Media  tab for reviewed elements of pertinent patient history.  Dylan Robles participated in the discussions, expressed understanding, and voiced agreement with the above plans.  All questions were answered to his satisfaction. he is encouraged to contact clinic should he have any questions or concerns prior to his return visit.     Follow up plan: - Return in about 3 months (around 09/28/2021) for Diabetes F/U with A1c in office, Previsit labs, Bring meter and logs.   Rayetta Pigg, [facility] [address] [place], [place] [address] Phone: [phone] Fax: [phone]  06/28/2021, 3:47 PM

## 2021-06-28 NOTE — Patient Instructions (Signed)

## 2021-07-25 DIAGNOSIS — E1159 Type 2 diabetes mellitus with other circulatory complications: Secondary | ICD-10-CM | POA: Diagnosis not present

## 2021-08-12 ENCOUNTER — Ambulatory Visit (INDEPENDENT_AMBULATORY_CARE_PROVIDER_SITE_OTHER): Payer: Medicare HMO | Admitting: Nurse Practitioner

## 2021-08-12 ENCOUNTER — Encounter: Payer: Self-pay | Admitting: Nurse Practitioner

## 2021-08-12 VITALS — BP 132/62 | HR 83 | Ht 78.0 in | Wt 222.0 lb

## 2021-08-12 DIAGNOSIS — I10 Essential (primary) hypertension: Secondary | ICD-10-CM

## 2021-08-12 DIAGNOSIS — E1169 Type 2 diabetes mellitus with other specified complication: Secondary | ICD-10-CM

## 2021-08-12 DIAGNOSIS — E785 Hyperlipidemia, unspecified: Secondary | ICD-10-CM | POA: Diagnosis not present

## 2021-08-12 NOTE — Progress Notes (Signed)
   Dylan Robles     MRN: [id number]      DOB: Dec 08, 1940   HPI Dylan Robles with past medical history of essential hypertension, PVD, type 2 diabetes with hyperlipidemia, hypothyroidism, CKD stage III in is here  to fill forms needed to get supplies for his CGM.  He is currently on NovoLog sliding scale 3 times daily with meals, Levemir 20 units at bedtime and denies hypoglycemic episodes.  Has been following up with endocrinologist as planned   Patient denies any adverse reactions to current medications since his last visit he denies any new complaints today.            ROS Denies recent fever or chills. Denies sinus pressure, nasal congestion, ear pain or sore throat. Denies chest congestion, productive cough or wheezing. Denies chest pains, palpitations and leg swelling Denies abdominal pain, nausea, vomiting,diarrhea or constipation.   Denies dysuria, frequency, hesitancy or incontinence. Denies joint pain, swelling and limitation in mobility. Denies headaches, seizures, numbness, or tingling. Denies skin break down or rash.   PE  BP 132/62 (BP Location: Right Arm, Cuff Size: Normal)   Pulse 83   Ht '6\' 6"'$  (1.981 m)   Wt 222 lb (100.7 kg)   SpO2 94%   BMI 25.65 kg/m   Patient alert and oriented and in no cardiopulmonary distress.  Chest: Clear to auscultation bilaterally.  CVS: S1, S2 no murmurs, no S3.Regular rate.  ABD: Soft non tender.   Ext: No edema  MS: Adequate ROM spine, shoulders, hips and knees.  Skin: Intact, no ulcerations or rash noted.  Psych: Good eye contact, normal affect. Memory intact not anxious or depressed appearing.  CNS: CN 2-12 intact, power,  normal throughout.no focal deficits noted.   Assessment & Plan  Essential hypertension BP Readings from Last 3 Encounters:  08/12/21 132/62  06/28/21 121/73  06/10/21 136/78  Well-controlled on amlodipine 5 mg daily, isosorbide mononitrate 30 mg daily, metoprolol 50 mg daily, lisinopril  20 mg daily Continue current medications DASH diet advised engage in regular daily exercises at least 150 minutes weekly as tolerated  Type 2 diabetes mellitus with hyperlipidemia ([place]) Lab Results  Component Value Date   HGBA1C 7.6 06/08/2021  Chronic condition uncontrolled On Levemir 20 units daily at bedtime, NovoLog sliding scale insulin, Current medications avoid sugar sweets soda Supplies for CGM ordered today Patient encouraged to maintain close follow-up with endocrinologist he verbalized understanding.

## 2021-08-12 NOTE — Assessment & Plan Note (Signed)
BP Readings from Last 3 Encounters:  08/12/21 132/62  06/28/21 121/73  06/10/21 136/78  Well-controlled on amlodipine 5 mg daily, isosorbide mononitrate 30 mg daily, metoprolol 50 mg daily, lisinopril 20 mg daily Continue current medications DASH diet advised engage in regular daily exercises at least 150 minutes weekly as tolerated

## 2021-08-12 NOTE — Patient Instructions (Signed)

## 2021-08-12 NOTE — Assessment & Plan Note (Signed)
Lab Results  Component Value Date   HGBA1C 7.6 06/08/2021  Chronic condition uncontrolled On Levemir 20 units daily at bedtime, NovoLog sliding scale insulin, Current medications avoid sugar sweets soda Supplies for CGM ordered today Patient encouraged to maintain close follow-up with endocrinologist he verbalized understanding.

## 2021-08-24 ENCOUNTER — Other Ambulatory Visit: Payer: Self-pay | Admitting: Nurse Practitioner

## 2021-08-24 DIAGNOSIS — Z87438 Personal history of other diseases of male genital organs: Secondary | ICD-10-CM

## 2021-08-24 NOTE — Telephone Encounter (Signed)
Provider not at this practice, will refuse medication.  Requested Prescriptions  Pending Prescriptions Disp Refills  . tamsulosin (FLOMAX) 0.4 MG CAPS capsule [Pharmacy Med Name: TAMSULOSIN HYDROCHLORIDE 0.4 MG Capsule] 90 capsule 1    Sig: TAKE 1 CAPSULE AT BEDTIME     Urology: Alpha-Adrenergic Blocker Failed - 08/24/2021 11:31 AM      Failed - PSA in normal range and within 360 days    No results found for: "LABPSA", "PSA", "PSA1", "ULTRAPSA"       Passed - Last BP in normal range    BP Readings from Last 1 Encounters:  08/12/21 132/62         Passed - Valid encounter within last 12 months    Recent Outpatient Visits          6 months ago Type 2 diabetes mellitus with hyperlipidemia ([place])   [place] Noemi Chapel A, NP   8 months ago Type 2 diabetes mellitus with hyperlipidemia ([place])   [place] [name], NP   9 months ago Type 2 diabetes mellitus with hyperlipidemia ([place])   [place] [name], NP   1 year ago Type 2 diabetes mellitus with hyperlipidemia ([place])   [place] [name], NP   1 year ago Burning with urination   [place] [name], NP      Future Appointments            In 2 months [place], Dewaine Conger, FNP [facility]

## 2021-09-13 DIAGNOSIS — I10 Essential (primary) hypertension: Secondary | ICD-10-CM | POA: Diagnosis not present

## 2021-09-13 DIAGNOSIS — R0602 Shortness of breath: Secondary | ICD-10-CM | POA: Diagnosis not present

## 2021-09-13 DIAGNOSIS — I340 Nonrheumatic mitral (valve) insufficiency: Secondary | ICD-10-CM | POA: Diagnosis not present

## 2021-09-13 DIAGNOSIS — E785 Hyperlipidemia, unspecified: Secondary | ICD-10-CM | POA: Diagnosis not present

## 2021-09-13 DIAGNOSIS — I2510 Atherosclerotic heart disease of native coronary artery without angina pectoris: Secondary | ICD-10-CM | POA: Diagnosis not present

## 2021-09-16 ENCOUNTER — Other Ambulatory Visit: Payer: Self-pay

## 2021-09-16 DIAGNOSIS — I10 Essential (primary) hypertension: Secondary | ICD-10-CM

## 2021-09-16 DIAGNOSIS — E039 Hypothyroidism, unspecified: Secondary | ICD-10-CM

## 2021-09-16 MED ORDER — AMLODIPINE BESYLATE 5 MG PO TABS: 5.0000 mg | ORAL_TABLET | Freq: Every day | ORAL | 2 refills | Status: DC

## 2021-09-16 MED ORDER — LEVOTHYROXINE SODIUM 88 MCG PO TABS: 88.0000 ug | ORAL_TABLET | Freq: Every day | ORAL | 2 refills | Status: DC

## 2021-09-22 ENCOUNTER — Ambulatory Visit: Payer: Medicare HMO | Admitting: Podiatry

## 2021-09-22 ENCOUNTER — Encounter: Payer: Self-pay | Admitting: Podiatry

## 2021-09-22 DIAGNOSIS — I739 Peripheral vascular disease, unspecified: Secondary | ICD-10-CM | POA: Diagnosis not present

## 2021-09-22 DIAGNOSIS — M79675 Pain in left toe(s): Secondary | ICD-10-CM | POA: Diagnosis not present

## 2021-09-22 DIAGNOSIS — E1142 Type 2 diabetes mellitus with diabetic polyneuropathy: Secondary | ICD-10-CM | POA: Diagnosis not present

## 2021-09-22 DIAGNOSIS — B351 Tinea unguium: Secondary | ICD-10-CM

## 2021-09-22 DIAGNOSIS — M79674 Pain in right toe(s): Secondary | ICD-10-CM

## 2021-09-22 DIAGNOSIS — L608 Other nail disorders: Secondary | ICD-10-CM

## 2021-09-22 NOTE — Progress Notes (Signed)
This patient returns to my office for at risk foot care.  This patient requires this care by a professional since this patient will be at risk due to having diabetes and PVD.  This patient is unable to cut nails himself since the patient cannot reach his nails.These nails are painful walking and wearing shoes.  This patient presents for at risk foot care today.  General Appearance  Alert, conversant and in no acute stress.  Vascular  Dorsalis pedis and posterior tibial  pulses are weakly palpable  bilaterally.  Capillary return is within normal limits  bilaterally. Cold feet  Bilaterally. Absent digital hair.  Swelling foot 1/4  Neurologic  Senn-Weinstein monofilament wire test within normal limits right..LOPS absent left foot. Muscle power within normal limits bilaterally.  Nails Thick disfigured discolored nails with subungual debris  from hallux to fifth toes bilaterally. No evidence of bacterial infection or drainage bilaterally.  Orthopedic  No limitations of motion  feet .  No crepitus or effusions noted.  No bony pathology or digital deformities noted.  Skin  normotropic skin with no porokeratosis noted bilaterally.  No signs of infections or ulcers noted.     Onychomycosis  Pain in right toes  Pain in left toes  Consent was obtained for treatment procedures.   Mechanical debridement of nails 1-5  bilaterally performed with a nail nipper.  Filed with dremel without incident.    Return office visit   3 months                   Told patient to return for periodic foot care and evaluation due to potential at risk complications.   Yuri Fana DPM  

## 2021-09-29 ENCOUNTER — Encounter: Payer: Self-pay | Admitting: Nurse Practitioner

## 2021-09-29 ENCOUNTER — Ambulatory Visit (INDEPENDENT_AMBULATORY_CARE_PROVIDER_SITE_OTHER): Payer: Medicare HMO | Admitting: Nurse Practitioner

## 2021-09-29 VITALS — BP 131/70 | HR 62 | Ht 78.0 in | Wt 232.8 lb

## 2021-09-29 DIAGNOSIS — Z794 Long term (current) use of insulin: Secondary | ICD-10-CM | POA: Diagnosis not present

## 2021-09-29 DIAGNOSIS — N1832 Chronic kidney disease, stage 3b: Secondary | ICD-10-CM

## 2021-09-29 DIAGNOSIS — E039 Hypothyroidism, unspecified: Secondary | ICD-10-CM

## 2021-09-29 DIAGNOSIS — E1122 Type 2 diabetes mellitus with diabetic chronic kidney disease: Secondary | ICD-10-CM

## 2021-09-29 LAB — POCT GLYCOSYLATED HEMOGLOBIN (HGB A1C): Hemoglobin A1C: 9.7 % — AB (ref 4.0–5.6)

## 2021-09-29 MED ORDER — LEVEMIR FLEXTOUCH 100 UNIT/ML ~~LOC~~ SOPN: 20.0000 [IU] | PEN_INJECTOR | Freq: Every day | SUBCUTANEOUS | 3 refills | Status: DC

## 2021-09-29 NOTE — Progress Notes (Signed)
Endocrinology Follow Up Note       09/29/2021, 3:08 PM   Subjective:    Patient ID: Dylan Robles, male    DOB: 11-08-1940.  Dylan Robles is being seen in follow up after being seen in consultation for management of currently uncontrolled symptomatic diabetes requested by  Renee Rival, FNP.   Past Medical History:  Diagnosis Date   Acute lower UTI 09/20/2017   Bradycardia on ECG 07/31/2011   Coronary artery disease    hyperlipidemia   Decreased GFR 09/23/2019   Dehydration 09/21/2017   Diabetes mellitus    Diabetes mellitus without complication ([place])    Hypercholesterolemia    Hypertension    Hypothyroidism    Left arm pain 01/29/2020   Paronychia of great toe of left foot 09/26/2018   Paronychia of great toe, left 09/26/2018   PERIPHERAL NEUROPATHY 01/13/2006   Qualifier: Diagnosis of  By: Truett Mainland MD, Christine     Shortness of breath dyspnea    with exertion   Vasovagal syncope    Weakness 09/20/2017    Past Surgical History:  Procedure Laterality Date   APPENDECTOMY     CARDIAC CATHETERIZATION N/A 06/16/2014   Procedure: Left Heart Cath;  Surgeon: Dionisio David, MD;  Location: [facility];  Service: Cardiovascular;  Laterality: N/A;   CIRCUMCISION, NON-NEWBORN     HERNIA REPAIR     LOWER EXTREMITY ANGIOGRAPHY Left 12/24/2018   Procedure: LOWER EXTREMITY ANGIOGRAPHY;  Surgeon: Katha Cabal, MD;  Location: [facility];  Service: Cardiovascular;  Laterality: Left;   THYROID SURGERY      Social History   Socioeconomic History   Marital status: Married    Spouse name: Not on file   Number of children: Not on file   Years of education: Not on file   Highest education level: Not on file  Occupational History   Occupation: Truck Geophysicist/field seismologist    Comment: Retired  Tobacco Use   Smoking status: Former   Smokeless tobacco: Never  Scientific laboratory technician Use: Never used  Substance and  Sexual Activity   Alcohol use: No   Drug use: No   Sexual activity: Not on file  Other Topics Concern   Not on file  Social History Narrative   ** Merged History Encounter **       Social Determinants of Health   Financial Resource Strain: Low Risk  (03/29/2021)   Overall Financial Resource Strain (CARDIA)    Difficulty of Paying Living Expenses: Not very hard  Food Insecurity: No Food Insecurity (03/29/2021)   Hunger Vital Sign    Worried About Running Out of Food in the Last Year: Never true    Ran Out of Food in the Last Year: Never true  Transportation Needs: No Transportation Needs (03/29/2021)   PRAPARE - Hydrologist (Medical): No    Lack of Transportation (Non-Medical): No  Physical Activity: Insufficiently Active (03/29/2021)   Exercise Vital Sign    Days of Exercise per Week: 7 days    Minutes of Exercise per Session: 20 min  Stress: No Stress Concern Present (03/29/2021)   [place]  [facility]    Feeling of Stress : Not at all  Social Connections: Socially Integrated (03/29/2021)   Social Connection and Isolation Panel [NHANES]    Frequency of Communication with Friends and Family: More than three times a week    Frequency of Social Gatherings with Friends and Family: More than three times a week    Attends Religious Services: More than 4 times per year    Active Member of [employer] or Organizations: Yes    Attends Archivist Meetings: 1 to 4 times per year    Marital Status: Married    Family History  Problem Relation Age of Onset   Diabetes Father    Hypertension Father    Cancer Mother    Diabetes Brother     Outpatient Encounter Medications as of 09/29/2021  Medication Sig   amLODipine (NORVASC) 5 MG tablet Take 1 tablet (5 mg total) by mouth daily.   aspirin EC 81 MG tablet Take 81 mg by mouth daily.    atorvastatin (LIPITOR) 80 MG tablet TAKE 1 TABLET AT BEDTIME.    BRILINTA 60 MG TABS tablet Take 60 mg by mouth 2 (two) times daily.   cetirizine (ZYRTEC) 5 MG tablet Take 1 tablet (5 mg total) by mouth daily.   ezetimibe (ZETIA) 10 MG tablet Take 10 mg by mouth daily.   Fish Oil-Vitamin D 1000-1000 MG-UNIT CAPS Take by mouth once. daily   furosemide (LASIX) 20 MG tablet Take 20 mg by mouth as needed.   gabapentin (NEURONTIN) 300 MG capsule Take 1 capsule (300 mg total) by mouth 3 (three) times daily.   Glucagon (GVOKE HYPOPEN 2-PACK) 1 MG/0.2ML SOAJ Inject 1 mg into the skin daily as needed.   insulin aspart (NOVOLOG) 100 UNIT/ML injection Inject 5-11 Units into the skin 3 (three) times daily before meals.   isosorbide mononitrate (IMDUR) 30 MG 24 hr tablet TAKE 1 TABLET EVERY DAY   levothyroxine (SYNTHROID) 88 MCG tablet Take 1 tablet (88 mcg total) by mouth daily before breakfast.   lisinopril (ZESTRIL) 20 MG tablet Take 1 tablet (20 mg total) by mouth daily.   metoprolol succinate (TOPROL-XL) 50 MG 24 hr tablet Take 1 tablet (50 mg total) by mouth daily.   tamsulosin (FLOMAX) 0.4 MG CAPS capsule TAKE 1 CAPSULE AT BEDTIME   [DISCONTINUED] LEVEMIR FLEXTOUCH 100 UNIT/ML Pen Inject 38 Units into the skin at bedtime.   LEVEMIR FLEXTOUCH 100 UNIT/ML FlexTouch Pen Inject 20 Units into the skin at bedtime.   No facility-administered encounter medications on file as of 09/29/2021.    ALLERGIES: Allergies  Allergen Reactions   Plavix [Clopidogrel] Hives    VACCINATION STATUS: Immunization History  Administered Date(s) Administered   Fluad Quad(high Dose 65+) 09/22/2019, 11/03/2020   Influenza Whole 10/05/2005, 12/17/2006   Influenza,inj,quad, With Preservative 11/25/2018   Moderna Sars-Covid-2 Vaccination 03/06/2019, 04/03/2019, 01/06/2020   Pneumococcal Conjugate-13 01/29/2020   Pneumococcal Polysaccharide-23 08/21/2005   Td 12/17/2006   Tdap 06/10/2021   Zoster Recombinat (Shingrix) 06/03/2021    Diabetes He presents for his follow-up diabetic  visit. He has type 2 diabetes mellitus. Onset time: Diagnosed at approx age of 73. His disease course has been worsening. There are no hypoglycemic associated symptoms. Associated symptoms include foot paresthesias. There are no hypoglycemic complications. Symptoms are stable. Diabetic complications include heart disease, nephropathy, peripheral neuropathy and PVD. Risk factors for coronary artery disease include diabetes mellitus, dyslipidemia, family history, male sex and hypertension. Current  diabetic treatment includes intensive insulin program. He is compliant with treatment most of the time. His weight is fluctuating minimally. He is following a generally healthy diet. Meal planning includes avoidance of concentrated sweets. He has had a previous visit with a dietitian. He participates in exercise intermittently. His home blood glucose trend is increasing steadily. His overall blood glucose range is >200 mg/dl. (He presents today, accompanied by his wife, with his CGM showing fluctuating glycemic profile with significant postprandial spikes.  His POCT A1c today is 9.7%, increasing from last visit of 7.6%.  He notes he has been forgetting his insulin pen when out at restaurants.  Analysis of his CGM shows TIR 34%, TAR 66%, TBR 0%.) An ACE inhibitor/angiotensin II receptor blocker is being taken. He sees a podiatrist.Eye exam is current.     Review of systems  Constitutional: + Minimally fluctuating body weight, current Body mass index is 26.9 kg/m., no fatigue, no subjective hyperthermia, no subjective hypothermia Eyes: no blurry vision, no xerophthalmia ENT: no sore throat, no nodules palpated in throat, no dysphagia/odynophagia, no hoarseness Cardiovascular: no chest pain, no shortness of breath, no palpitations, no leg swelling Respiratory: no cough, no shortness of breath Gastrointestinal: no nausea/vomiting/diarrhea Musculoskeletal: no muscle/joint aches, walks with cane Skin: no rashes, no  hyperemia Neurological: no tremors, + numbness/tingling to BLE, no dizziness Psychiatric: no depression, no anxiety   Objective:     BP 131/70 (BP Location: Left Arm, Patient Position: Sitting, Cuff Size: Large)   Pulse 62   Ht '6\' 6"'$  (1.981 m)   Wt 232 lb 12.8 oz (105.6 kg)   BMI 26.90 kg/m   Wt Readings from Last 3 Encounters:  09/29/21 232 lb 12.8 oz (105.6 kg)  08/12/21 222 lb (100.7 kg)  06/28/21 221 lb (100.2 kg)     BP Readings from Last 3 Encounters:  09/29/21 131/70  08/12/21 132/62  06/28/21 121/73     Physical Exam- Limited  Constitutional:  Body mass index is 26.9 kg/m. , not in acute distress, normal state of mind Eyes:  EOMI, no exophthalmos Neck: Supple Cardiovascular: RRR, no murmurs, rubs, or gallops, no edema Respiratory: Adequate breathing efforts, no crackles, rales, rhonchi, or wheezing Musculoskeletal: no gross deformities, strength intact in all four extremities, no gross restriction of joint movements, walks with cane Skin:  no rashes, no hyperemia Neurological: no tremor with outstretched hands    CMP ( most recent) CMP     Component Value Date/Time   NA 137 05/12/2021 2120   NA 143 02/07/2019 0000   K 5.2 (H) 05/13/2021 0216   CL 103 05/12/2021 2120   CO2 23 05/12/2021 2120   GLUCOSE 295 (H) 05/12/2021 2120   BUN 24 (H) 05/12/2021 2120   BUN 15 02/07/2019 0000   CREATININE 1.56 (H) 05/12/2021 2120   CREATININE 1.34 (H) 02/02/2021 0839   CALCIUM 10.1 05/12/2021 2120   PROT 7.5 05/12/2021 2120   ALBUMIN 4.0 05/12/2021 2120   AST 20 05/12/2021 2120   ALT 13 05/12/2021 2120   ALKPHOS 94 05/12/2021 2120   BILITOT 1.2 05/12/2021 2120   GFRNONAA 44 (L) 05/12/2021 2120   GFRNONAA 54 (L) 04/29/2020 1141   GFRAA 63 04/29/2020 1141     Diabetic Labs (most recent): Lab Results  Component Value Date   HGBA1C 9.7 (A) 09/29/2021   HGBA1C 7.6 06/08/2021   HGBA1C 10.5 (H) 02/02/2021   MICROALBUR 5.0 02/02/2021   MICROALBUR 1.4  09/22/2019   MICROALBUR 0.67 12/17/2006  Lipid Panel ( most recent) Lipid Panel     Component Value Date/Time   CHOL 101 06/14/2021 0916   TRIG 53 06/14/2021 0916   HDL 33 (L) 06/14/2021 0916   CHOLHDL 3.1 06/14/2021 0916   CHOLHDL 3.2 11/03/2020 1032   VLDL 12 06/17/2008 0304   LDLCALC 55 06/14/2021 0916   LDLCALC 73 11/03/2020 1032   LABVLDL 13 06/14/2021 0916      Lab Results  Component Value Date   TSH 1.790 06/14/2021   TSH 1.45 11/03/2020   TSH 1.75 03/31/2020   TSH 1.17 09/22/2019   TSH 0.94 03/19/2019   TSH 1.039 09/20/2017   TSH 0.26 (A) 03/27/2014   TSH 1.746 06/17/2008   TSH 1.028 12/18/2007   TSH 0.283 (L) 09/17/2006   FREET4 1.3 09/22/2019   FREET4 1.3 03/19/2019   FREET4 1.32 07/31/2011   FREET4 1.09 06/17/2008   FREET4 1.31 12/18/2007   FREET4 1.27 05/10/2006           Assessment & Plan:   1) Type 2 diabetes mellitus with stage 3b chronic kidney disease, with long-term current use of insulin ([place])  He presents today, accompanied by his wife, with his CGM showing fluctuating glycemic profile with significant postprandial spikes.  His POCT A1c today is 9.7%, increasing from last visit of 7.6%.  He notes he has been forgetting his insulin pen when out at restaurants.  Analysis of his CGM shows TIR 34%, TAR 66%, TBR 0%.  - Dylan Robles has currently uncontrolled symptomatic type 2 DM since 81 years of age.   -Recent labs reviewed.  - I had a long discussion with him about the progressive nature of diabetes and the pathology behind its complications. -his diabetes is complicated by CAD, CKD, PVD, neuropathy and he remains at a high risk for more acute and chronic complications which include CAD, CVA, CKD, retinopathy, and neuropathy. These are all discussed in detail with him.  The following Lifestyle Medicine recommendations according to [facility]) were discussed and offered to patient and he agrees to start  the journey:  A. Whole Foods, Plant-based plate comprising of fruits and vegetables, plant-based proteins, whole-grain carbohydrates was discussed in detail with the patient.   A list for source of those nutrients were also provided to the patient.  Patient will use only water or unsweetened tea for hydration. B.  The need to stay away from risky substances including alcohol, smoking; obtaining 7 to 9 hours of restorative sleep, at least 150 minutes of moderate intensity exercise weekly, the importance of healthy social connections,  and stress reduction techniques were discussed. C.  A full color page of  Calorie density of various food groups per pound showing examples of each food groups was provided to the patient.  - Nutritional counseling repeated at each appointment due to patients tendency to fall back in to old habits.  - The patient admits there is a room for improvement in their diet and drink choices. -  Suggestion is made for the patient to avoid simple carbohydrates from their diet including Cakes, Sweet Desserts / Pastries, Ice Cream, Soda (diet and regular), Sweet Tea, Candies, Chips, Cookies, Sweet Pastries, Store Bought Juices, Alcohol in Excess of 1-2 drinks a day, Artificial Sweeteners, Coffee Creamer, and "Sugar-free" Products. This will help patient to have stable blood glucose profile and potentially avoid unintended weight gain.   - I encouraged the patient to switch to unprocessed or minimally processed complex starch  and increased protein intake (animal or plant source), fruits, and vegetables.   - Patient is advised to stick to a routine mealtimes to eat 3 meals a day and avoid unnecessary snacks (to snack only to correct hypoglycemia).  - I have approached him with the following individualized plan to manage his diabetes and patient agrees:   -He is advised to continue his Levemir 20 units SQ nightly and adjust his Novolog to 5-11 units TID with meals if glucose is above  90 and he is eating (Specific instructions on how to titrate insulin dosage based on glucose readings given to patient in writing).    -he is encouraged to continue monitoring glucose 4 times daily (using his CGM), before meals and before bed, and to call the clinic if he has readings less than 70 or above 300 for 3 tests in a row.  - he is warned not to take insulin without proper monitoring per orders. - Adjustment parameters are given to him for hypo and hyperglycemia in writing.  - his Glipizide was be discontinued, risk outweighs benefit for this patient- risk of hypoglycemia given advanced age and CKD. - he is not a candidate for full dose Metformin due to concurrent renal insufficiency, I discontinued this at last visit to de-escalate his treatment.  - he is not an ideal candidate for incretin therapy due to body habitus with BMI at 25.  - Specific targets for  A1c; LDL, HDL, and Triglycerides were discussed with the patient.  2) Blood Pressure /Hypertension:  his blood pressure is controlled to target.   he is advised to continue his current medications including Norvasc 5 mg p.o. daily with breakfast, Lasix 20 mg po daily as needed, Lisinopril 20 mg po daily and Metoprolol 50 mg po daily.  3) Lipids/Hyperlipidemia:    Review of his recent lipid panel from 11/03/20 showed controlled LDL at 73 .  he is advised to continue Lipitor 80 mg daily at bedtime and Zetia 10 mg po daily.  Side effects and precautions discussed with him.  4)  Weight/Diet:  his Body mass index is 26.9 kg/m.  -  he is NOT a candidate for weight loss.  Exercise, and detailed carbohydrates information provided  -  detailed on discharge instructions.  5) Hypothyroidism-unspecified The details surrounding his diagnosis are not available.  He is advised to continue his Levothyroxine 88 mcg po daily before breakfast.  Will recheck TFTs prior to next visit.  - The correct intake of thyroid hormone (Levothyroxine,  Synthroid), is on empty stomach first thing in the morning, with water, separated by at least 30 minutes from breakfast and other medications,  and separated by more than 4 hours from calcium, iron, multivitamins, acid reflux medications (PPIs).  - This medication is a life-long medication and will be needed to correct thyroid hormone imbalances for the rest of your life.  The dose may change from time to time, based on thyroid blood work.  - It is extremely important to be consistent taking this medication, near the same time each morning.  -AVOID TAKING PRODUCTS CONTAINING BIOTIN (commonly found in Hair, Skin, Nails vitamins) AS IT INTERFERES WITH THE VALIDITY OF THYROID FUNCTION BLOOD TESTS.  6) Chronic Care/Health Maintenance: -he is on ACEI/ARB and Statin medications and is encouraged to initiate and continue to follow up with Ophthalmology, Dentist, Podiatrist at least yearly or according to recommendations, and advised to stay away from smoking. I have recommended yearly flu vaccine and pneumonia vaccine at  least every 5 years; moderate intensity exercise for up to 150 minutes weekly; and sleep for at least 7 hours a day.  - he is advised to maintain close follow up with Renee Rival, FNP for primary care needs, as well as his other providers for optimal and coordinated care.      I spent 40 minutes in the care of the patient today including review of labs from [place], Lipids, Thyroid Function, Hematology (current and previous including abstractions from other facilities); face-to-face time discussing  his blood glucose readings/logs, discussing hypoglycemia and hyperglycemia episodes and symptoms, medications doses, his options of short and long term treatment based on the latest standards of care / guidelines;  discussion about incorporating lifestyle medicine;  and documenting the encounter. Risk reduction counseling performed per USPSTF guidelines to reduce obesity and cardiovascular  risk factors.     Please refer to Patient Instructions for Blood Glucose Monitoring and Insulin/Medications Dosing Guide"  in media tab for additional information. Please  also refer to " Patient Self Inventory" in the Media  tab for reviewed elements of pertinent patient history.  Dylan Robles participated in the discussions, expressed understanding, and voiced agreement with the above plans.  All questions were answered to his satisfaction. he is encouraged to contact clinic should he have any questions or concerns prior to his return visit.     Follow up plan: - Return in about 3 months (around 12/29/2021) for Diabetes F/U with A1c in office, Previsit labs, Bring meter and logs, Thyroid follow up.  Rayetta Pigg, [facility] [address] [place], [place] [address] Phone: [phone] Fax: [phone]  09/29/2021, 3:08 PM

## 2021-10-03 ENCOUNTER — Telehealth: Payer: Self-pay

## 2021-10-03 NOTE — Telephone Encounter (Signed)
Patient's wife, [name] left a Vm stating she would like to speak with the nurse regarding his Elenor [place]. Call her at [phone]

## 2021-10-04 MED ORDER — FREESTYLE LIBRE 2 SENSOR MISC: 3 refills | Status: DC

## 2021-10-04 NOTE — Telephone Encounter (Signed)
Yes we can most certainly take over this for him.  I sent in a new prescription to [place] for him.

## 2021-10-04 NOTE — Telephone Encounter (Signed)
Talked with the patient's wife. She states that the patient's PCP prescribed the [place] 2 for her husband , and has never sent in a Rx for it. This PCP is leaving and there will be another PCP to take over in November. She is wanting to know if this prescription can be written for [facility] and sent in for the patient?

## 2021-10-05 MED ORDER — FREESTYLE LIBRE 2 SENSOR MISC: 3 refills | Status: DC

## 2021-10-05 NOTE — Telephone Encounter (Signed)
Called and left a message that the [place] 2 request had been sent to [place].

## 2021-10-05 NOTE — Telephone Encounter (Signed)
Talked with Mrs. Reid , she said that the RX should have gone to [facility]. Their phone number id [phone]. I have called but they have not opened, their hours are 9-6.

## 2021-10-05 NOTE — Telephone Encounter (Signed)
I talked with Dylan Robles who works with [facility]. Their fax number is [phone]. This is where the request for the [place] 2 needs to be sent.

## 2021-10-05 NOTE — Telephone Encounter (Signed)
done

## 2021-10-25 DIAGNOSIS — E1165 Type 2 diabetes mellitus with hyperglycemia: Secondary | ICD-10-CM | POA: Diagnosis not present

## 2021-11-07 ENCOUNTER — Encounter (INDEPENDENT_AMBULATORY_CARE_PROVIDER_SITE_OTHER): Payer: Self-pay

## 2021-11-10 ENCOUNTER — Ambulatory Visit (INDEPENDENT_AMBULATORY_CARE_PROVIDER_SITE_OTHER): Payer: Medicare HMO | Admitting: Internal Medicine

## 2021-11-10 ENCOUNTER — Encounter: Payer: Self-pay | Admitting: Internal Medicine

## 2021-11-10 VITALS — BP 115/57 | HR 65 | Ht 78.0 in | Wt 235.4 lb

## 2021-11-10 DIAGNOSIS — E1169 Type 2 diabetes mellitus with other specified complication: Secondary | ICD-10-CM

## 2021-11-10 DIAGNOSIS — Z0001 Encounter for general adult medical examination with abnormal findings: Secondary | ICD-10-CM

## 2021-11-10 DIAGNOSIS — E1159 Type 2 diabetes mellitus with other circulatory complications: Secondary | ICD-10-CM

## 2021-11-10 DIAGNOSIS — Z23 Encounter for immunization: Secondary | ICD-10-CM

## 2021-11-10 DIAGNOSIS — Z139 Encounter for screening, unspecified: Secondary | ICD-10-CM | POA: Diagnosis not present

## 2021-11-10 DIAGNOSIS — E039 Hypothyroidism, unspecified: Secondary | ICD-10-CM | POA: Diagnosis not present

## 2021-11-10 DIAGNOSIS — E785 Hyperlipidemia, unspecified: Secondary | ICD-10-CM | POA: Diagnosis not present

## 2021-11-10 NOTE — Assessment & Plan Note (Signed)
Presenting today for his annual exam.  He reports chronic dyspnea on exertion but is otherwise asymptomatic and states that he feels well.  Recent records and labs were reviewed. -Repeat labs ordered today, including urine microalbumin/creatinine ratio -Influenza vaccine administered today -Diabetic foot exam completed on 09/22/2021 -Follow-up in 3 months

## 2021-11-10 NOTE — Patient Instructions (Signed)
It was a pleasure to see you today.  Thank you for giving [place] the opportunity to be involved in your care.  Below is a brief recap of your visit and next steps.  We will plan to see you again in 3 months.  Summary We completed your annual exam today. No medication changes were made You will receive your flu shot and we will update labs and check for protein in your urine.

## 2021-11-10 NOTE — Progress Notes (Signed)
Complete physical exam  Patient: Dylan Robles   DOB: April 01, 1940   81 y.o. Male  MRN: [id number]  Subjective:    Chief Complaint  Patient presents with   Annual Exam    Dylan Robles is a 81 y.o. male who presents today for a complete physical exam. He reports consuming a general diet. Home exercise routine includes yardwork. He generally feels fairly well. He reports sleeping well. He does not have additional problems to discuss today.   Most recent fall risk assessment:    11/10/2021    1:08 PM  [place] in the past year? 0  Number falls in past yr: 0  Injury with Fall? 0  Risk for fall due to : No Fall Risks  Follow up Falls evaluation completed     Most recent depression screenings:    11/10/2021    1:09 PM 08/12/2021    3:39 PM  PHQ 2/9 Scores  PHQ - 2 Score 0 0    Vision:Within last year  Past Medical History:  Diagnosis Date   Acute lower UTI 09/20/2017   Bradycardia on ECG 07/31/2011   Coronary artery disease    hyperlipidemia   Decreased GFR 09/23/2019   Dehydration 09/21/2017   Diabetes mellitus    Diabetes mellitus without complication ([place])    Hypercholesterolemia    Hypertension    Hypothyroidism    Left arm pain 01/29/2020   Paronychia of great toe of left foot 09/26/2018   Paronychia of great toe, left 09/26/2018   PERIPHERAL NEUROPATHY 01/13/2006   Qualifier: Diagnosis of  By: Truett Mainland MD, Christine     Shortness of breath dyspnea    with exertion   Vasovagal syncope    Weakness 09/20/2017   Past Surgical History:  Procedure Laterality Date   APPENDECTOMY     CARDIAC CATHETERIZATION N/A 06/16/2014   Procedure: Left Heart Cath;  Surgeon: Dionisio David, MD;  Location: [facility];  Service: Cardiovascular;  Laterality: N/A;   CIRCUMCISION, NON-NEWBORN     HERNIA REPAIR     LOWER EXTREMITY ANGIOGRAPHY Left 12/24/2018   Procedure: LOWER EXTREMITY ANGIOGRAPHY;  Surgeon: Katha Cabal, MD;  Location: [facility];  Service:  Cardiovascular;  Laterality: Left;   THYROID SURGERY     Social History   Tobacco Use   Smoking status: Former   Smokeless tobacco: Never  Vaping Use   Vaping Use: Never used  Substance Use Topics   Alcohol use: No   Drug use: No   Family History  Problem Relation Age of Onset   Diabetes Father    Hypertension Father    Cancer Mother    Diabetes Brother    Allergies  Allergen Reactions   Plavix [Clopidogrel] Hives      Patient Care Team: Johnette Abraham, MD as PCP - General (Internal Medicine) Buelah Manis, Modena Nunnery, MD (Family Medicine) Edythe Clarity, [facility] as Pharmacist (Pharmacist)   Outpatient Medications Prior to Visit  Medication Sig   amLODipine (NORVASC) 5 MG tablet Take 1 tablet (5 mg total) by mouth daily.   aspirin EC 81 MG tablet Take 81 mg by mouth daily.    atorvastatin (LIPITOR) 80 MG tablet TAKE 1 TABLET AT BEDTIME.   BRILINTA 60 MG TABS tablet Take 60 mg by mouth 2 (two) times daily.   cetirizine (ZYRTEC) 5 MG tablet Take 1 tablet (5 mg total) by mouth daily.   Continuous Blood Gluc Sensor (FREESTYLE  LIBRE 2 SENSOR) MISC Use to check glucose at least 4 times daily   ezetimibe (ZETIA) 10 MG tablet Take 10 mg by mouth daily.   Fish Oil-Vitamin D 1000-1000 MG-UNIT CAPS Take by mouth once. daily   furosemide (LASIX) 20 MG tablet Take 20 mg by mouth as needed.   gabapentin (NEURONTIN) 300 MG capsule Take 1 capsule (300 mg total) by mouth 3 (three) times daily.   Glucagon (GVOKE HYPOPEN 2-PACK) 1 MG/0.2ML SOAJ Inject 1 mg into the skin daily as needed.   insulin aspart (NOVOLOG) 100 UNIT/ML injection Inject 5-11 Units into the skin 3 (three) times daily before meals.   isosorbide mononitrate (IMDUR) 30 MG 24 hr tablet TAKE 1 TABLET EVERY DAY   LEVEMIR FLEXTOUCH 100 UNIT/ML FlexTouch Pen Inject 20 Units into the skin at bedtime.   levothyroxine (SYNTHROID) 88 MCG tablet Take 1 tablet (88 mcg total) by mouth daily before breakfast.   lisinopril (ZESTRIL) 20 MG  tablet Take 1 tablet (20 mg total) by mouth daily.   metoprolol succinate (TOPROL-XL) 50 MG 24 hr tablet Take 1 tablet (50 mg total) by mouth daily.   tamsulosin (FLOMAX) 0.4 MG CAPS capsule TAKE 1 CAPSULE AT BEDTIME   No facility-administered medications prior to visit.    Review of Systems  Constitutional:  Negative for chills and fever.  HENT:  Negative for sore throat.   Respiratory:  Positive for shortness of breath (DOE (chronic)). Negative for cough.   Cardiovascular:  Negative for chest pain, palpitations and leg swelling.  Gastrointestinal:  Negative for abdominal pain, blood in stool, constipation, diarrhea, nausea and vomiting.  Genitourinary:  Negative for dysuria and hematuria.  Musculoskeletal:  Negative for myalgias.  Skin:  Negative for itching and rash.  Neurological:  Negative for dizziness and headaches.  Psychiatric/Behavioral:  Negative for depression and suicidal ideas.       Objective:     BP (!) 115/57   Pulse 65   Ht '6\' 6"'$  (1.981 m)   Wt 235 lb 6.4 oz (106.8 kg)   SpO2 94%   BMI 27.20 kg/m  BP Readings from Last 3 Encounters:  11/10/21 (!) 115/57  09/29/21 131/70  08/12/21 132/62    Physical Exam Vitals reviewed.  Constitutional:      General: He is not in acute distress.    Appearance: Normal appearance. He is not ill-appearing.  HENT:     Head: Normocephalic and atraumatic.     Right Ear: External ear normal.     Left Ear: External ear normal.     Nose: Nose normal. No congestion or rhinorrhea.     Mouth/Throat:     Mouth: Mucous membranes are moist.     Pharynx: Oropharynx is clear.  Eyes:     General: No scleral icterus.    Extraocular Movements: Extraocular movements intact.     Conjunctiva/sclera: Conjunctivae normal.     Pupils: Pupils are equal, round, and reactive to light.  Cardiovascular:     Rate and Rhythm: Normal rate and regular rhythm.     Pulses: Normal pulses.     Heart sounds: Normal heart sounds. No murmur  heard. Pulmonary:     Effort: Pulmonary effort is normal.     Breath sounds: Normal breath sounds. No wheezing, rhonchi or rales.  Abdominal:     General: Abdomen is flat. Bowel sounds are normal. There is no distension.     Palpations: Abdomen is soft.     Tenderness: There is no abdominal  tenderness.  Musculoskeletal:        General: No swelling or deformity. Normal range of motion.     Cervical back: Normal range of motion.  Skin:    General: Skin is warm and dry.     Capillary Refill: Capillary refill takes less than 2 seconds.  Neurological:     General: No focal deficit present.     Mental Status: He is alert and oriented to person, place, and time.     Motor: No weakness.  Psychiatric:        Mood and Affect: Mood normal.        Behavior: Behavior normal.        Thought Content: Thought content normal.     Last CBC Lab Results  Component Value Date   WBC 15.1 (H) 05/12/2021   HGB 12.7 (L) 05/12/2021   HCT 39.7 05/12/2021   MCV 93.2 05/12/2021   MCH 29.8 05/12/2021   RDW 15.1 05/12/2021   PLT 252 66/29/4765   Last metabolic panel Lab Results  Component Value Date   GLUCOSE 295 (H) 05/12/2021   NA 137 05/12/2021   K 5.2 (H) 05/13/2021   CL 103 05/12/2021   CO2 23 05/12/2021   BUN 24 (H) 05/12/2021   CREATININE 1.56 (H) 05/12/2021   GFRNONAA 44 (L) 05/12/2021   CALCIUM 10.1 05/12/2021   PROT 7.5 05/12/2021   ALBUMIN 4.0 05/12/2021   BILITOT 1.2 05/12/2021   ALKPHOS 94 05/12/2021   AST 20 05/12/2021   ALT 13 05/12/2021   ANIONGAP 11 05/12/2021   Last lipids Lab Results  Component Value Date   CHOL 101 06/14/2021   HDL 33 (L) 06/14/2021   LDLCALC 55 06/14/2021   TRIG 53 06/14/2021   CHOLHDL 3.1 06/14/2021   Last hemoglobin A1c Lab Results  Component Value Date   HGBA1C 9.7 (A) 09/29/2021   Last thyroid functions Lab Results  Component Value Date   TSH 1.790 06/14/2021   Last vitamin B12 and Folate Lab Results  Component Value Date    VITAMINB12 283 01/29/2020   FOLATE 10.3 01/29/2020      Assessment & Plan:    Routine Health Maintenance and Physical Exam  Immunization History  Administered Date(s) Administered   Fluad Quad(high Dose 65+) 09/22/2019, 11/03/2020   Influenza Whole 10/05/2005, 12/17/2006   Influenza,inj,quad, With Preservative 11/25/2018   Moderna Sars-Covid-2 Vaccination 03/06/2019, 04/03/2019, 01/06/2020   Pneumococcal Conjugate-13 01/29/2020   Pneumococcal Polysaccharide-23 08/21/2005   Td 12/17/2006   Tdap 06/10/2021   Zoster Recombinat (Shingrix) 06/03/2021, 09/20/2021    Health Maintenance  Topic Date Due   Diabetic kidney evaluation - Urine ACR  12/27/2016   COVID-19 Vaccine (4 - Moderna series) 03/02/2020   INFLUENZA VACCINE  08/09/2021   HEMOGLOBIN A1C  12/29/2021   Medicare Annual Wellness (AWV)  03/30/2022   OPHTHALMOLOGY EXAM  04/29/2022   Diabetic kidney evaluation - GFR measurement  05/13/2022   FOOT EXAM  09/23/2022   TETANUS/TDAP  06/11/2031   Pneumonia Vaccine 59+ Years old  Completed   Zoster Vaccines- Shingrix  Completed   HPV VACCINES  Aged Out    Discussed health benefits of physical activity, and encouraged him to engage in regular exercise appropriate for his age and condition.  Problem List Items Addressed This Visit       Type 2 diabetes mellitus with hyperlipidemia ([place])    Most recent A1c 9.7.  He is followed by endocrinology and currently prescribed Levemir and NovoLog.  He  currently denies polyuria/polydipsia. -Urine microalbumin/creatinine ratio ordered today -Diabetic foot exam completed 9/14      Encounter for well adult exam with abnormal findings - Primary    Presenting today for his annual exam.  He reports chronic dyspnea on exertion but is otherwise asymptomatic and states that he feels well.  Recent records and labs were reviewed. -Repeat labs ordered today, including urine microalbumin/creatinine ratio -Influenza vaccine administered  today -Diabetic foot exam completed on 09/22/2021 -Follow-up in 3 months      Return in about 3 months (around 02/10/2022).     Johnette Abraham, MD

## 2021-11-10 NOTE — Assessment & Plan Note (Signed)
Most recent A1c 9.7.  He is followed by endocrinology and currently prescribed Levemir and NovoLog.  He currently denies polyuria/polydipsia. -Urine microalbumin/creatinine ratio ordered today -Diabetic foot exam completed 9/14

## 2021-11-11 LAB — CBC WITH DIFFERENTIAL/PLATELET
Basophils Absolute: 0 10*3/uL (ref 0.0–0.2)
Basos: 1 %
EOS (ABSOLUTE): 0.2 10*3/uL (ref 0.0–0.4)
Eos: 3 %
Hematocrit: 36.3 % — ABNORMAL LOW (ref 37.5–51.0)
Hemoglobin: 11.8 g/dL — ABNORMAL LOW (ref 13.0–17.7)
Immature Grans (Abs): 0 10*3/uL (ref 0.0–0.1)
Immature Granulocytes: 0 %
Lymphocytes Absolute: 1.6 10*3/uL (ref 0.7–3.1)
Lymphs: 24 %
MCH: 29.9 pg (ref 26.6–33.0)
MCHC: 32.5 g/dL (ref 31.5–35.7)
MCV: 92 fL (ref 79–97)
Monocytes Absolute: 0.7 10*3/uL (ref 0.1–0.9)
Monocytes: 10 %
Neutrophils Absolute: 4.3 10*3/uL (ref 1.4–7.0)
Neutrophils: 62 %
Platelets: 258 10*3/uL (ref 150–450)
RBC: 3.94 x10E6/uL — ABNORMAL LOW (ref 4.14–5.80)
RDW: 13.6 % (ref 11.6–15.4)
WBC: 6.9 10*3/uL (ref 3.4–10.8)

## 2021-11-11 LAB — CMP14+EGFR
ALT: 26 IU/L (ref 0–44)
AST: 27 IU/L (ref 0–40)
Albumin/Globulin Ratio: 1.5 (ref 1.2–2.2)
Albumin: 4 g/dL (ref 3.7–4.7)
Alkaline Phosphatase: 134 IU/L — ABNORMAL HIGH (ref 44–121)
BUN/Creatinine Ratio: 12 (ref 10–24)
BUN: 21 mg/dL (ref 8–27)
Bilirubin Total: 0.6 mg/dL (ref 0.0–1.2)
CO2: 23 mmol/L (ref 20–29)
Calcium: 9.7 mg/dL (ref 8.6–10.2)
Chloride: 103 mmol/L (ref 96–106)
Creatinine, Ser: 1.75 mg/dL — ABNORMAL HIGH (ref 0.76–1.27)
Globulin, Total: 2.6 g/dL (ref 1.5–4.5)
Glucose: 171 mg/dL — ABNORMAL HIGH (ref 70–99)
Potassium: 5.2 mmol/L (ref 3.5–5.2)
Sodium: 139 mmol/L (ref 134–144)
Total Protein: 6.6 g/dL (ref 6.0–8.5)
eGFR: 38 mL/min/{1.73_m2} — ABNORMAL LOW (ref >59)

## 2021-11-11 LAB — B12 AND FOLATE PANEL
Folate: 14.3 ng/mL (ref >3.0)
Vitamin B-12: 490 pg/mL (ref 232–1245)

## 2021-11-11 LAB — VITAMIN D 25 HYDROXY (VIT D DEFICIENCY, FRACTURES): Vit D, 25-Hydroxy: 34.2 ng/mL (ref 30.0–100.0)

## 2021-11-12 LAB — MICROALBUMIN / CREATININE URINE RATIO
Creatinine, Urine: 168.3 mg/dL
Microalb/Creat Ratio: 17 mg/g creat (ref 0–29)
Microalbumin, Urine: 28.5 ug/mL

## 2021-11-14 ENCOUNTER — Other Ambulatory Visit: Payer: Self-pay

## 2021-11-14 ENCOUNTER — Telehealth: Payer: Self-pay | Admitting: Internal Medicine

## 2021-11-14 DIAGNOSIS — E1169 Type 2 diabetes mellitus with other specified complication: Secondary | ICD-10-CM

## 2021-11-14 NOTE — Telephone Encounter (Signed)
Spoke with renita

## 2021-11-14 NOTE — Telephone Encounter (Signed)
Called in for lab results

## 2021-11-15 ENCOUNTER — Ambulatory Visit: Payer: Medicare HMO | Admitting: Internal Medicine

## 2021-11-28 ENCOUNTER — Telehealth: Payer: Self-pay | Admitting: Internal Medicine

## 2021-11-28 NOTE — Telephone Encounter (Signed)
Pt wife dropped off Chronic Condition Verification forms   Noted/copied/given to provider

## 2021-12-21 ENCOUNTER — Other Ambulatory Visit: Payer: Self-pay

## 2021-12-21 MED ORDER — LISINOPRIL 20 MG PO TABS: 20.0000 mg | ORAL_TABLET | Freq: Every day | ORAL | 3 refills | Status: DC

## 2021-12-22 ENCOUNTER — Encounter: Payer: Self-pay | Admitting: Podiatry

## 2021-12-22 ENCOUNTER — Ambulatory Visit: Payer: Medicare HMO | Admitting: Podiatry

## 2021-12-22 DIAGNOSIS — M79674 Pain in right toe(s): Secondary | ICD-10-CM

## 2021-12-22 DIAGNOSIS — L608 Other nail disorders: Secondary | ICD-10-CM

## 2021-12-22 DIAGNOSIS — E1142 Type 2 diabetes mellitus with diabetic polyneuropathy: Secondary | ICD-10-CM | POA: Diagnosis not present

## 2021-12-22 DIAGNOSIS — I739 Peripheral vascular disease, unspecified: Secondary | ICD-10-CM

## 2021-12-22 DIAGNOSIS — M79675 Pain in left toe(s): Secondary | ICD-10-CM | POA: Diagnosis not present

## 2021-12-22 DIAGNOSIS — B351 Tinea unguium: Secondary | ICD-10-CM | POA: Diagnosis not present

## 2021-12-22 NOTE — Progress Notes (Signed)
This patient returns to my office for at risk foot care.  This patient requires this care by a professional since this patient will be at risk due to having diabetes and PVD.  This patient is unable to cut nails himself since the patient cannot reach his nails.These nails are painful walking and wearing shoes.  This patient presents for at risk foot care today.  General Appearance  Alert, conversant and in no acute stress.  Vascular  Dorsalis pedis and posterior tibial  pulses are weakly palpable  bilaterally.  Capillary return is within normal limits  bilaterally. Cold feet  Bilaterally. Absent digital hair.  Swelling foot 1/4  Neurologic  Senn-Weinstein monofilament wire test within normal limits right.Marland KitchenLOPS absent left foot. Muscle power within normal limits bilaterally.  Nails Thick disfigured discolored nails with subungual debris  from hallux to fifth toes bilaterally. No evidence of bacterial infection or drainage bilaterally.  Orthopedic  No limitations of motion  feet .  No crepitus or effusions noted.  No bony pathology or digital deformities noted.  Skin  normotropic skin with no porokeratosis noted bilaterally.  No signs of infections or ulcers noted.     Onychomycosis  Pain in right toes  Pain in left toes  Consent was obtained for treatment procedures.   Mechanical debridement of nails 1-5  bilaterally performed with a nail nipper.  Filed with dremel without incident.    Return office visit   3 months                   Told patient to return for periodic foot care and evaluation due to potential at risk complications.   [name] DPM

## 2021-12-23 DIAGNOSIS — Z794 Long term (current) use of insulin: Secondary | ICD-10-CM | POA: Diagnosis not present

## 2021-12-23 DIAGNOSIS — E1122 Type 2 diabetes mellitus with diabetic chronic kidney disease: Secondary | ICD-10-CM | POA: Diagnosis not present

## 2021-12-23 DIAGNOSIS — E785 Hyperlipidemia, unspecified: Secondary | ICD-10-CM | POA: Diagnosis not present

## 2021-12-23 DIAGNOSIS — E039 Hypothyroidism, unspecified: Secondary | ICD-10-CM | POA: Diagnosis not present

## 2021-12-23 DIAGNOSIS — E1169 Type 2 diabetes mellitus with other specified complication: Secondary | ICD-10-CM | POA: Diagnosis not present

## 2021-12-23 DIAGNOSIS — N1832 Chronic kidney disease, stage 3b: Secondary | ICD-10-CM | POA: Diagnosis not present

## 2021-12-24 LAB — BMP8+EGFR
BUN/Creatinine Ratio: 12 (ref 10–24)
BUN: 18 mg/dL (ref 8–27)
CO2: 23 mmol/L (ref 20–29)
Calcium: 9.7 mg/dL (ref 8.6–10.2)
Chloride: 109 mmol/L — ABNORMAL HIGH (ref 96–106)
Creatinine, Ser: 1.56 mg/dL — ABNORMAL HIGH (ref 0.76–1.27)
Glucose: 104 mg/dL — ABNORMAL HIGH (ref 70–99)
Potassium: 4.5 mmol/L (ref 3.5–5.2)
Sodium: 146 mmol/L — ABNORMAL HIGH (ref 134–144)
eGFR: 44 mL/min/{1.73_m2} — ABNORMAL LOW (ref >59)

## 2021-12-24 LAB — COMPREHENSIVE METABOLIC PANEL
ALT: 24 IU/L (ref 0–44)
AST: 22 IU/L (ref 0–40)
Albumin/Globulin Ratio: 1.5 (ref 1.2–2.2)
Albumin: 3.8 g/dL (ref 3.7–4.7)
Alkaline Phosphatase: 126 IU/L — ABNORMAL HIGH (ref 44–121)
BUN/Creatinine Ratio: 12 (ref 10–24)
BUN: 18 mg/dL (ref 8–27)
Bilirubin Total: 0.7 mg/dL (ref 0.0–1.2)
CO2: 23 mmol/L (ref 20–29)
Calcium: 9.6 mg/dL (ref 8.6–10.2)
Chloride: 107 mmol/L — ABNORMAL HIGH (ref 96–106)
Creatinine, Ser: 1.51 mg/dL — ABNORMAL HIGH (ref 0.76–1.27)
Globulin, Total: 2.6 g/dL (ref 1.5–4.5)
Glucose: 110 mg/dL — ABNORMAL HIGH (ref 70–99)
Potassium: 4.4 mmol/L (ref 3.5–5.2)
Sodium: 143 mmol/L (ref 134–144)
Total Protein: 6.4 g/dL (ref 6.0–8.5)
eGFR: 46 mL/min/{1.73_m2} — ABNORMAL LOW (ref >59)

## 2021-12-24 LAB — T4, FREE: Free T4: 1.41 ng/dL (ref 0.82–1.77)

## 2021-12-24 LAB — TSH: TSH: 2.14 u[IU]/mL (ref 0.450–4.500)

## 2021-12-27 NOTE — Patient Instructions (Incomplete)

## 2021-12-29 ENCOUNTER — Ambulatory Visit: Payer: Medicare HMO | Admitting: Nurse Practitioner

## 2021-12-29 DIAGNOSIS — E039 Hypothyroidism, unspecified: Secondary | ICD-10-CM

## 2021-12-29 DIAGNOSIS — Z794 Long term (current) use of insulin: Secondary | ICD-10-CM

## 2022-01-10 ENCOUNTER — Other Ambulatory Visit: Payer: Self-pay

## 2022-01-10 DIAGNOSIS — I10 Essential (primary) hypertension: Secondary | ICD-10-CM

## 2022-01-10 MED ORDER — ISOSORBIDE MONONITRATE ER 30 MG PO TB24: 30.0000 mg | ORAL_TABLET | Freq: Every day | ORAL | 1 refills | Status: DC

## 2022-01-11 ENCOUNTER — Other Ambulatory Visit: Payer: Self-pay

## 2022-01-11 DIAGNOSIS — R067 Sneezing: Secondary | ICD-10-CM

## 2022-01-11 MED ORDER — CETIRIZINE HCL 5 MG PO TABS: 5.0000 mg | ORAL_TABLET | Freq: Every day | ORAL | 1 refills | Status: DC

## 2022-01-17 DIAGNOSIS — I10 Essential (primary) hypertension: Secondary | ICD-10-CM | POA: Diagnosis not present

## 2022-01-17 DIAGNOSIS — I2510 Atherosclerotic heart disease of native coronary artery without angina pectoris: Secondary | ICD-10-CM | POA: Diagnosis not present

## 2022-01-17 DIAGNOSIS — E785 Hyperlipidemia, unspecified: Secondary | ICD-10-CM | POA: Diagnosis not present

## 2022-01-17 DIAGNOSIS — R0602 Shortness of breath: Secondary | ICD-10-CM | POA: Diagnosis not present

## 2022-01-17 DIAGNOSIS — I340 Nonrheumatic mitral (valve) insufficiency: Secondary | ICD-10-CM | POA: Diagnosis not present

## 2022-01-18 ENCOUNTER — Ambulatory Visit: Payer: Medicare HMO | Admitting: Nurse Practitioner

## 2022-01-18 ENCOUNTER — Encounter: Payer: Self-pay | Admitting: Nurse Practitioner

## 2022-01-18 VITALS — BP 140/78 | HR 66 | Ht 78.0 in | Wt 236.8 lb

## 2022-01-18 DIAGNOSIS — N1832 Chronic kidney disease, stage 3b: Secondary | ICD-10-CM

## 2022-01-18 DIAGNOSIS — E039 Hypothyroidism, unspecified: Secondary | ICD-10-CM

## 2022-01-18 DIAGNOSIS — Z794 Long term (current) use of insulin: Secondary | ICD-10-CM

## 2022-01-18 DIAGNOSIS — E1122 Type 2 diabetes mellitus with diabetic chronic kidney disease: Secondary | ICD-10-CM | POA: Diagnosis not present

## 2022-01-18 LAB — POCT GLYCOSYLATED HEMOGLOBIN (HGB A1C): Hemoglobin A1C: 8.3 % — AB (ref 4.0–5.6)

## 2022-01-18 MED ORDER — TRESIBA FLEXTOUCH 100 UNIT/ML ~~LOC~~ SOPN: 16.0000 [IU] | PEN_INJECTOR | Freq: Every day | SUBCUTANEOUS | 3 refills | Status: DC

## 2022-01-18 NOTE — Progress Notes (Signed)
Endocrinology Follow Up Note       01/18/2022, 6:13 PM   Subjective:    Patient ID: Dylan Robles, male    DOB: March 31, 1940.  Dylan Robles is being seen in follow up after being seen in consultation for management of currently uncontrolled symptomatic diabetes requested by  Johnette Abraham, MD.   Past Medical History:  Diagnosis Date   Acute lower UTI 09/20/2017   Bradycardia on ECG 07/31/2011   Coronary artery disease    hyperlipidemia   Decreased GFR 09/23/2019   Dehydration 09/21/2017   Diabetes mellitus    Diabetes mellitus without complication ([place])    Hypercholesterolemia    Hypertension    Hypothyroidism    Left arm pain 01/29/2020   Paronychia of great toe of left foot 09/26/2018   Paronychia of great toe, left 09/26/2018   PERIPHERAL NEUROPATHY 01/13/2006   Qualifier: Diagnosis of  By: Truett Mainland MD, Christine     Shortness of breath dyspnea    with exertion   Vasovagal syncope    Weakness 09/20/2017    Past Surgical History:  Procedure Laterality Date   APPENDECTOMY     CARDIAC CATHETERIZATION N/A 06/16/2014   Procedure: Left Heart Cath;  Surgeon: Dionisio David, MD;  Location: [facility];  Service: Cardiovascular;  Laterality: N/A;   CIRCUMCISION, NON-NEWBORN     HERNIA REPAIR     LOWER EXTREMITY ANGIOGRAPHY Left 12/24/2018   Procedure: LOWER EXTREMITY ANGIOGRAPHY;  Surgeon: Katha Cabal, MD;  Location: [facility];  Service: Cardiovascular;  Laterality: Left;   THYROID SURGERY      Social History   Socioeconomic History   Marital status: Married    Spouse name: Not on file   Number of children: Not on file   Years of education: Not on file   Highest education level: Not on file  Occupational History   Occupation: Truck Geophysicist/field seismologist    Comment: Retired  Tobacco Use   Smoking status: Former   Smokeless tobacco: Never  Scientific laboratory technician Use: Never used  Substance and  Sexual Activity   Alcohol use: No   Drug use: No   Sexual activity: Not on file  Other Topics Concern   Not on file  Social History Narrative   ** Merged History Encounter **       Social Determinants of Health   Financial Resource Strain: Low Risk  (03/29/2021)   Overall Financial Resource Strain (CARDIA)    Difficulty of Paying Living Expenses: Not very hard  Food Insecurity: No Food Insecurity (03/29/2021)   Hunger Vital Sign    Worried About Running Out of Food in the Last Year: Never true    Ran Out of Food in the Last Year: Never true  Transportation Needs: No Transportation Needs (03/29/2021)   PRAPARE - Hydrologist (Medical): No    Lack of Transportation (Non-Medical): No  Physical Activity: Insufficiently Active (03/29/2021)   Exercise Vital Sign    Days of Exercise per Week: 7 days    Minutes of Exercise per Session: 20 min  Stress: No Stress Concern Present (03/29/2021)   [place]  [facility] [facility] [employer]    Feeling of Stress : Not at all  Social Connections: Socially Integrated (03/29/2021)   Social Connection and Isolation Panel [NHANES]    Frequency of Communication with Friends and Family: More than three times a week    Frequency of Social Gatherings with Friends and Family: More than three times a week    Attends Religious Services: More than 4 times per year    Active Member of [employer] or Organizations: Yes    Attends Archivist Meetings: 1 to 4 times per year    Marital Status: Married    Family History  Problem Relation Age of Onset   Diabetes Father    Hypertension Father    Cancer Mother    Diabetes Brother     Outpatient Encounter Medications as of 01/18/2022  Medication Sig   amLODipine (NORVASC) 5 MG tablet Take 1 tablet (5 mg total) by mouth daily.   aspirin EC 81 MG tablet Take 81 mg by mouth daily.    atorvastatin (LIPITOR) 80 MG tablet TAKE 1 TABLET AT BEDTIME.    BRILINTA 60 MG TABS tablet Take 60 mg by mouth 2 (two) times daily.   cetirizine (ZYRTEC) 5 MG tablet Take 1 tablet (5 mg total) by mouth daily.   Continuous Blood Gluc Sensor (FREESTYLE LIBRE 2 SENSOR) MISC Use to check glucose at least 4 times daily   ezetimibe (ZETIA) 10 MG tablet Take 10 mg by mouth daily.   Fish Oil-Vitamin D 1000-1000 MG-UNIT CAPS Take by mouth once. daily   furosemide (LASIX) 20 MG tablet Take 20 mg by mouth as needed.   gabapentin (NEURONTIN) 300 MG capsule Take 1 capsule (300 mg total) by mouth 3 (three) times daily.   Glucagon (GVOKE HYPOPEN 2-PACK) 1 MG/0.2ML SOAJ Inject 1 mg into the skin daily as needed.   insulin aspart (NOVOLOG) 100 UNIT/ML injection Inject 5-11 Units into the skin 3 (three) times daily before meals.   insulin degludec (TRESIBA FLEXTOUCH) 100 UNIT/ML FlexTouch Pen Inject 16 Units into the skin at bedtime.   isosorbide mononitrate (IMDUR) 30 MG 24 hr tablet Take 1 tablet (30 mg total) by mouth daily.   levothyroxine (SYNTHROID) 88 MCG tablet Take 1 tablet (88 mcg total) by mouth daily before breakfast.   lisinopril (ZESTRIL) 20 MG tablet Take 1 tablet (20 mg total) by mouth daily.   metoprolol succinate (TOPROL-XL) 50 MG 24 hr tablet Take 1 tablet (50 mg total) by mouth daily.   tamsulosin (FLOMAX) 0.4 MG CAPS capsule TAKE 1 CAPSULE AT BEDTIME   [DISCONTINUED] LEVEMIR FLEXTOUCH 100 UNIT/ML FlexTouch Pen Inject 20 Units into the skin at bedtime.   No facility-administered encounter medications on file as of 01/18/2022.    ALLERGIES: Allergies  Allergen Reactions   Plavix [Clopidogrel] Hives    VACCINATION STATUS: Immunization History  Administered Date(s) Administered   Fluad Quad(high Dose 65+) 09/22/2019, 11/03/2020, 11/10/2021   Influenza Whole 10/05/2005, 12/17/2006   Influenza,inj,quad, With Preservative 11/25/2018   Moderna Sars-Covid-2 Vaccination 03/06/2019, 04/03/2019, 01/06/2020   Pneumococcal Conjugate-13 01/29/2020    Pneumococcal Polysaccharide-23 08/21/2005   Td 12/17/2006   Tdap 06/10/2021   Zoster Recombinat (Shingrix) 06/03/2021, 09/20/2021    Diabetes He presents for his follow-up diabetic visit. He has type 2 diabetes mellitus. Onset time: Diagnosed at approx age of 61. His disease course has been improving. Hypoglycemia symptoms include nervousness/anxiousness, sweats and tremors. Associated symptoms include foot paresthesias. Hypoglycemia complications include nocturnal hypoglycemia.  Symptoms are stable. Diabetic complications include heart disease, nephropathy, peripheral neuropathy and PVD. Risk factors for coronary artery disease include diabetes mellitus, dyslipidemia, family history, male sex and hypertension. Current diabetic treatment includes intensive insulin program. He is compliant with treatment most of the time. His weight is fluctuating minimally. He is following a generally healthy diet. Meal planning includes avoidance of concentrated sweets. He has had a previous visit with a dietitian. He participates in exercise intermittently. His home blood glucose trend is decreasing steadily. His overall blood glucose range is 140-180 mg/dl. (He presents today, accompanied by his wife, with his CGM showing improving glycemic profile overall.  His POCT A1c today is 8.3%, improving from last visit of 9.7%.  Analysis of his CGM shows TIR 64%, TAR 35%, TBR 1% with a GMI of 7.2%.  He does note he sleeps in on most days and that is when his glucose tends to be low.) An ACE inhibitor/angiotensin II receptor blocker is being taken. He sees a podiatrist.Eye exam is current.     Review of systems  Constitutional: + Minimally fluctuating body weight, current Body mass index is 27.36 kg/m., no fatigue, no subjective hyperthermia, no subjective hypothermia Eyes: no blurry vision, no xerophthalmia ENT: no sore throat, no nodules palpated in throat, no dysphagia/odynophagia, no hoarseness Cardiovascular: no chest  pain, no shortness of breath, no palpitations, no leg swelling Respiratory: no cough, no shortness of breath Gastrointestinal: no nausea/vomiting/diarrhea Musculoskeletal: no muscle/joint aches, walks with cane Skin: no rashes, no hyperemia Neurological: no tremors, + numbness/tingling to BLE, no dizziness Psychiatric: no depression, no anxiety   Objective:     BP (!) 140/78 (BP Location: Right Arm) Comment: Rechecked with the manuel cuff  Pulse 66   Ht '6\' 6"'$  (1.981 m)   Wt 236 lb 12.8 oz (107.4 kg)   BMI 27.36 kg/m   Wt Readings from Last 3 Encounters:  01/18/22 236 lb 12.8 oz (107.4 kg)  11/10/21 235 lb 6.4 oz (106.8 kg)  09/29/21 232 lb 12.8 oz (105.6 kg)     BP Readings from Last 3 Encounters:  01/18/22 (!) 140/78  11/10/21 (!) 115/57  09/29/21 131/70     Physical Exam- Limited  Constitutional:  Body mass index is 27.36 kg/m. , not in acute distress, normal state of mind Eyes:  EOMI, no exophthalmos Musculoskeletal: no gross deformities, strength intact in all four extremities, no gross restriction of joint movements, walks with cane Skin:  no rashes, no hyperemia Neurological: no tremor with outstretched hands    CMP ( most recent) CMP     Component Value Date/Time   NA 146 (H) 12/23/2021 0942   K 4.5 12/23/2021 0942   CL 109 (H) 12/23/2021 0942   CO2 23 12/23/2021 0942   GLUCOSE 104 (H) 12/23/2021 0942   GLUCOSE 295 (H) 05/12/2021 2120   BUN 18 12/23/2021 0942   CREATININE 1.56 (H) 12/23/2021 0942   CREATININE 1.34 (H) 02/02/2021 0839   CALCIUM 9.7 12/23/2021 0942   PROT 6.4 12/23/2021 0937   ALBUMIN 3.8 12/23/2021 0937   AST 22 12/23/2021 0937   ALT 24 12/23/2021 0937   ALKPHOS 126 (H) 12/23/2021 0937   BILITOT 0.7 12/23/2021 0937   GFRNONAA 44 (L) 05/12/2021 2120   GFRNONAA 54 (L) 04/29/2020 1141   GFRAA 63 04/29/2020 1141     Diabetic Labs (most recent): Lab Results  Component Value Date   HGBA1C 8.3 (A) 01/18/2022   HGBA1C 9.7 (A)  09/29/2021   HGBA1C 7.6  06/08/2021   MICROALBUR 5.0 02/02/2021   MICROALBUR 1.4 09/22/2019   MICROALBUR 0.67 12/17/2006     Lipid Panel ( most recent) Lipid Panel     Component Value Date/Time   CHOL 101 06/14/2021 0916   TRIG 53 06/14/2021 0916   HDL 33 (L) 06/14/2021 0916   CHOLHDL 3.1 06/14/2021 0916   CHOLHDL 3.2 11/03/2020 1032   VLDL 12 06/17/2008 0304   LDLCALC 55 06/14/2021 0916   LDLCALC 73 11/03/2020 1032   LABVLDL 13 06/14/2021 0916      Lab Results  Component Value Date   TSH 2.140 12/23/2021   TSH 1.790 06/14/2021   TSH 1.45 11/03/2020   TSH 1.75 03/31/2020   TSH 1.17 09/22/2019   TSH 0.94 03/19/2019   TSH 1.039 09/20/2017   TSH 0.26 (A) 03/27/2014   TSH 1.746 06/17/2008   TSH 1.028 12/18/2007   FREET4 1.41 12/23/2021   FREET4 1.3 09/22/2019   FREET4 1.3 03/19/2019   FREET4 1.32 07/31/2011   FREET4 1.09 06/17/2008   FREET4 1.31 12/18/2007   FREET4 1.27 05/10/2006           Assessment & Plan:   1) Type 2 diabetes mellitus with stage 3b chronic kidney disease, with long-term current use of insulin ([place])  He presents today, accompanied by his wife, with his CGM showing improving glycemic profile overall.  His POCT A1c today is 8.3%, improving from last visit of 9.7%.  Analysis of his CGM shows TIR 64%, TAR 35%, TBR 1% with a GMI of 7.2%.  He does note he sleeps in on most days and that is when his glucose tends to be low.  - [name] has currently uncontrolled symptomatic type 2 DM since 82 years of age.   -Recent labs reviewed.  - I had a long discussion with him about the progressive nature of diabetes and the pathology behind its complications. -his diabetes is complicated by CAD, CKD, PVD, neuropathy and he remains at a high risk for more acute and chronic complications which include CAD, CVA, CKD, retinopathy, and neuropathy. These are all discussed in detail with him.  The following Lifestyle Medicine recommendations according to  [facility]) were discussed and offered to patient and he agrees to start the journey:  A. Whole Foods, Plant-based plate comprising of fruits and vegetables, plant-based proteins, whole-grain carbohydrates was discussed in detail with the patient.   A list for source of those nutrients were also provided to the patient.  Patient will use only water or unsweetened tea for hydration. B.  The need to stay away from risky substances including alcohol, smoking; obtaining 7 to 9 hours of restorative sleep, at least 150 minutes of moderate intensity exercise weekly, the importance of healthy social connections,  and stress reduction techniques were discussed. C.  A full color page of  Calorie density of various food groups per pound showing examples of each food groups was provided to the patient.  - Nutritional counseling repeated at each appointment due to patients tendency to fall back in to old habits.  - The patient admits there is a room for improvement in their diet and drink choices. -  Suggestion is made for the patient to avoid simple carbohydrates from their diet including Cakes, Sweet Desserts / Pastries, Ice Cream, Soda (diet and regular), Sweet Tea, Candies, Chips, Cookies, Sweet Pastries, Store Bought Juices, Alcohol in Excess of 1-2 drinks a day, Artificial Sweeteners, Coffee Creamer, and "Sugar-free" Products.  This will help patient to have stable blood glucose profile and potentially avoid unintended weight gain.   - I encouraged the patient to switch to unprocessed or minimally processed complex starch and increased protein intake (animal or plant source), fruits, and vegetables.   - Patient is advised to stick to a routine mealtimes to eat 3 meals a day and avoid unnecessary snacks (to snack only to correct hypoglycemia).  - I have approached him with the following individualized plan to manage his diabetes and patient agrees:   -He is advised to lower  his Tyler Aas (changing from Levemir due to insurance coverage) to 16 units SQ nightly.  He can continue his Novolog 5-11 units TID with meals if glucose is above 90 and he is eating (Specific instructions on how to titrate insulin dosage based on glucose readings given to patient in writing).    -he is encouraged to continue monitoring glucose 4 times daily (using his CGM), before meals and before bed, and to call the clinic if he has readings less than 70 or above 300 for 3 tests in a row.  - he is warned not to take insulin without proper monitoring per orders. - Adjustment parameters are given to him for hypo and hyperglycemia in writing.  - his Glipizide was be discontinued, risk outweighs benefit for this patient- risk of hypoglycemia given advanced age and CKD. - he is not a candidate for full dose Metformin due to concurrent renal insufficiency, I discontinued this at last visit to de-escalate his treatment.  - he is not an ideal candidate for incretin therapy due to body habitus with BMI at 25.  - Specific targets for  A1c; LDL, HDL, and Triglycerides were discussed with the patient.  2) Blood Pressure /Hypertension:  his blood pressure is controlled to target for his age.   he is advised to continue his current medications including Norvasc 5 mg p.o. daily with breakfast, Lasix 20 mg po daily as needed, Lisinopril 20 mg po daily and Metoprolol 50 mg po daily.  3) Lipids/Hyperlipidemia:    Review of his recent lipid panel from 06/14/21 showed controlled LDL at 55.  he is advised to continue Lipitor 80 mg daily at bedtime and Zetia 10 mg po daily.  Side effects and precautions discussed with him.  4)  Weight/Diet:  his Body mass index is 27.36 kg/m.  -  he is NOT a candidate for weight loss.  Exercise, and detailed carbohydrates information provided  -  detailed on discharge instructions.  5) Hypothyroidism-unspecified The details surrounding his diagnosis are not available.    His  previsit thyroid function tests are consistent with appropriate hormone replacement.  He is advised to continue his Levothyroxine 88 mcg po daily before breakfast.    - The correct intake of thyroid hormone (Levothyroxine, Synthroid), is on empty stomach first thing in the morning, with water, separated by at least 30 minutes from breakfast and other medications,  and separated by more than 4 hours from calcium, iron, multivitamins, acid reflux medications (PPIs).  - This medication is a life-long medication and will be needed to correct thyroid hormone imbalances for the rest of your life.  The dose may change from time to time, based on thyroid blood work.  - It is extremely important to be consistent taking this medication, near the same time each morning.  -AVOID TAKING PRODUCTS CONTAINING BIOTIN (commonly found in Hair, Skin, Nails vitamins) AS IT INTERFERES WITH THE VALIDITY OF THYROID FUNCTION BLOOD  TESTS.  6) Chronic Care/Health Maintenance: -he is on ACEI/ARB and Statin medications and is encouraged to initiate and continue to follow up with Ophthalmology, Dentist, Podiatrist at least yearly or according to recommendations, and advised to stay away from smoking. I have recommended yearly flu vaccine and pneumonia vaccine at least every 5 years; moderate intensity exercise for up to 150 minutes weekly; and sleep for at least 7 hours a day.  - he is advised to maintain close follow up with Doren Custard, Hazle Nordmann, MD for primary care needs, as well as his other providers for optimal and coordinated care.     I spent 35 minutes in the care of the patient today including review of labs from [place], Lipids, Thyroid Function, Hematology (current and previous including abstractions from other facilities); face-to-face time discussing  his blood glucose readings/logs, discussing hypoglycemia and hyperglycemia episodes and symptoms, medications doses, his options of short and long term treatment based on the  latest standards of care / guidelines;  discussion about incorporating lifestyle medicine;  and documenting the encounter. Risk reduction counseling performed per USPSTF guidelines to reduce obesity and cardiovascular risk factors.     Please refer to Patient Instructions for Blood Glucose Monitoring and Insulin/Medications Dosing Guide"  in media tab for additional information. Please  also refer to " Patient Self Inventory" in the Media  tab for reviewed elements of pertinent patient history.  [name] participated in the discussions, expressed understanding, and voiced agreement with the above plans.  All questions were answered to his satisfaction. he is encouraged to contact clinic should he have any questions or concerns prior to his return visit.     Follow up plan: - Return in about 3 months (around 04/19/2022) for Diabetes F/U with A1c in office, No previsit labs, Bring meter and logs.  Rayetta Pigg, [facility] [address] [place], [place] [address] Phone: [phone] Fax: [phone]  01/18/2022, 6:13 PM

## 2022-01-23 DIAGNOSIS — E1165 Type 2 diabetes mellitus with hyperglycemia: Secondary | ICD-10-CM | POA: Diagnosis not present

## 2022-02-13 ENCOUNTER — Ambulatory Visit (INDEPENDENT_AMBULATORY_CARE_PROVIDER_SITE_OTHER): Payer: Medicare HMO | Admitting: Internal Medicine

## 2022-02-13 ENCOUNTER — Encounter: Payer: Self-pay | Admitting: Internal Medicine

## 2022-02-13 VITALS — BP 128/67 | HR 74 | Ht 78.0 in | Wt 237.2 lb

## 2022-02-13 DIAGNOSIS — R600 Localized edema: Secondary | ICD-10-CM | POA: Diagnosis not present

## 2022-02-13 DIAGNOSIS — N1831 Chronic kidney disease, stage 3a: Secondary | ICD-10-CM

## 2022-02-13 DIAGNOSIS — I10 Essential (primary) hypertension: Secondary | ICD-10-CM | POA: Diagnosis not present

## 2022-02-13 MED ORDER — ISOSORBIDE MONONITRATE ER 60 MG PO TB24: 60.0000 mg | ORAL_TABLET | Freq: Every day | ORAL | 2 refills | Status: DC

## 2022-02-13 MED ORDER — LISINOPRIL 20 MG PO TABS: 20.0000 mg | ORAL_TABLET | Freq: Every day | ORAL | 3 refills | Status: DC

## 2022-02-13 NOTE — Patient Instructions (Signed)
It was a pleasure to see you today.  Thank you for giving [place] the opportunity to be involved in your care.  Below is a brief recap of your visit and next steps.  We will plan to see you again in 4 weeks.  Summary STOP amlodipine  Increase Imdur to 60 mg daily  Continue lisinopril and metoprolol and prescribed I have placed a referral to nephrology I recommend purchasing compression stockings at your pharmacy Follow up in 4 weeks

## 2022-02-13 NOTE — Progress Notes (Unsigned)
Established Patient Office Visit  Subjective   Patient ID: Dylan Robles, male    DOB: [birth year]  Age: 82 y.o. MRN: [id number]  Chief Complaint  Patient presents with   Diabetes    Follow up   [name] returns to care today.  He was last seen by me on 11/10/21 for his annual exam.  In the interim he has been seen by podiatry and endocrinology for follow-up.  There have otherwise been no acute interval events.  Past Medical History:  Diagnosis Date   Acute lower UTI 09/20/2017   Bradycardia on ECG 07/31/2011   Coronary artery disease    hyperlipidemia   Decreased GFR 09/23/2019   Dehydration 09/21/2017   Diabetes mellitus    Diabetes mellitus without complication ([place])    Hypercholesterolemia    Hypertension    Hypothyroidism    Left arm pain 01/29/2020   Paronychia of great toe of left foot 09/26/2018   Paronychia of great toe, left 09/26/2018   PERIPHERAL NEUROPATHY 01/13/2006   Qualifier: Diagnosis of  By: Truett Mainland MD, Christine     Shortness of breath dyspnea    with exertion   Vasovagal syncope    Weakness 09/20/2017   Past Surgical History:  Procedure Laterality Date   APPENDECTOMY     CARDIAC CATHETERIZATION N/A 06/16/2014   Procedure: Left Heart Cath;  Surgeon: Dionisio David, MD;  Location: [facility];  Service: Cardiovascular;  Laterality: N/A;   CIRCUMCISION, NON-NEWBORN     HERNIA REPAIR     LOWER EXTREMITY ANGIOGRAPHY Left 12/24/2018   Procedure: LOWER EXTREMITY ANGIOGRAPHY;  Surgeon: Katha Cabal, MD;  Location: [facility];  Service: Cardiovascular;  Laterality: Left;   THYROID SURGERY     Social History   Tobacco Use   Smoking status: Former   Smokeless tobacco: Never  Vaping Use   Vaping Use: Never used  Substance Use Topics   Alcohol use: No   Drug use: No   Family History  Problem Relation Age of Onset   Diabetes Father    Hypertension Father    Cancer Mother    Diabetes Brother    Allergies  Allergen Reactions   Plavix  [Clopidogrel] Hives   ROS    Objective:     BP 128/67   Pulse 74   Ht '6\' 6"'$  (1.981 m)   Wt 237 lb 3.2 oz (107.6 kg)   SpO2 94%   BMI 27.41 kg/m  BP Readings from Last 3 Encounters:  02/13/22 128/67  01/18/22 (!) 140/78  11/10/21 (!) 115/57      Physical Exam   No results found for any visits on 02/13/22.  Last CBC Lab Results  Component Value Date   WBC 6.9 11/10/2021   HGB 11.8 (L) 11/10/2021   HCT 36.3 (L) 11/10/2021   MCV 92 11/10/2021   MCH 29.9 11/10/2021   RDW 13.6 11/10/2021   PLT 258 63/78/5885   Last metabolic panel Lab Results  Component Value Date   GLUCOSE 104 (H) 12/23/2021   NA 146 (H) 12/23/2021   K 4.5 12/23/2021   CL 109 (H) 12/23/2021   CO2 23 12/23/2021   BUN 18 12/23/2021   CREATININE 1.56 (H) 12/23/2021   EGFR 44 (L) 12/23/2021   CALCIUM 9.7 12/23/2021   PROT 6.4 12/23/2021   ALBUMIN 3.8 12/23/2021   LABGLOB 2.6 12/23/2021   AGRATIO 1.5 12/23/2021   BILITOT 0.7 12/23/2021   ALKPHOS 126 (H) 12/23/2021  AST 22 12/23/2021   ALT 24 12/23/2021   ANIONGAP 11 05/12/2021   Last lipids Lab Results  Component Value Date   CHOL 101 06/14/2021   HDL 33 (L) 06/14/2021   LDLCALC 55 06/14/2021   TRIG 53 06/14/2021   CHOLHDL 3.1 06/14/2021   Last hemoglobin A1c Lab Results  Component Value Date   HGBA1C 8.3 (A) 01/18/2022   Last thyroid functions Lab Results  Component Value Date   TSH 2.140 12/23/2021   Last vitamin D Lab Results  Component Value Date   VD25OH 34.2 11/10/2021   Last vitamin B12 and Folate Lab Results  Component Value Date   VITAMINB12 490 11/10/2021   FOLATE 14.3 11/10/2021     Assessment & Plan:   Problem List Items Addressed This Visit   None   No follow-ups on file.    Dylan Abraham, MD

## 2022-02-14 LAB — CMP14+EGFR
ALT: 41 IU/L (ref 0–44)
AST: 26 IU/L (ref 0–40)
Albumin/Globulin Ratio: 1.6 (ref 1.2–2.2)
Albumin: 3.9 g/dL (ref 3.7–4.7)
Alkaline Phosphatase: 131 IU/L — ABNORMAL HIGH (ref 44–121)
BUN/Creatinine Ratio: 12 (ref 10–24)
BUN: 19 mg/dL (ref 8–27)
Bilirubin Total: 0.6 mg/dL (ref 0.0–1.2)
CO2: 21 mmol/L (ref 20–29)
Calcium: 9.7 mg/dL (ref 8.6–10.2)
Chloride: 106 mmol/L (ref 96–106)
Creatinine, Ser: 1.62 mg/dL — ABNORMAL HIGH (ref 0.76–1.27)
Globulin, Total: 2.4 g/dL (ref 1.5–4.5)
Glucose: 155 mg/dL — ABNORMAL HIGH (ref 70–99)
Potassium: 4.9 mmol/L (ref 3.5–5.2)
Sodium: 141 mmol/L (ref 134–144)
Total Protein: 6.3 g/dL (ref 6.0–8.5)
eGFR: 42 mL/min/{1.73_m2} — ABNORMAL LOW (ref >59)

## 2022-02-15 DIAGNOSIS — R600 Localized edema: Secondary | ICD-10-CM | POA: Insufficient documentation

## 2022-02-15 NOTE — Assessment & Plan Note (Signed)
Noted on exam today, radiating proximally to just below the knee.  Denies symptoms of orthopnea/PND as well as dyspnea on exertion.  Currently prescribed amlodipine. -Discontinue amlodipine today in case this is contributing to lower extremity edema -I have purchasing compression stockings and elevating his legs as able -Follow-up in 4 weeks for reassessment

## 2022-02-15 NOTE — Assessment & Plan Note (Signed)
CKD 3A.  No microalbuminuria identified on urine study from November 2023. -Nephrology referral placed today -Repeat chemistry panel ordered today

## 2022-02-15 NOTE — Assessment & Plan Note (Signed)
He is currently prescribed amlodipine 5 mg daily, Imdur 30 mg daily, lisinopril 20 mg daily, and metoprolol succinate 50 mg daily for treatment of hypertension.  His blood pressure today is 128/67 -Discontinue amlodipine in the setting of bilateral lower extremity edema -Increase Imdur to 60 mg daily -Continue lisinopril and metoprolol succinate at current doses -Follow-up in 4 weeks for HTN check

## 2022-03-13 ENCOUNTER — Encounter: Payer: Self-pay | Admitting: Internal Medicine

## 2022-03-13 ENCOUNTER — Ambulatory Visit (INDEPENDENT_AMBULATORY_CARE_PROVIDER_SITE_OTHER): Payer: Medicare HMO | Admitting: Internal Medicine

## 2022-03-13 VITALS — BP 141/65 | HR 70 | Ht 78.0 in | Wt 238.8 lb

## 2022-03-13 DIAGNOSIS — I10 Essential (primary) hypertension: Secondary | ICD-10-CM | POA: Diagnosis not present

## 2022-03-13 DIAGNOSIS — R600 Localized edema: Secondary | ICD-10-CM

## 2022-03-13 NOTE — Assessment & Plan Note (Signed)
Amlodipine was discontinued at his last appointment in case this was contributing to bilateral lower extreme edema.  His symptoms are largely unchanged today.  There is trace lower extremity edema present bilaterally on exam today.  He continues to deny orthopnea/PND and dyspnea on exertion.  His edema is likely due to chronic venous insufficiency.  He is prescribed Lasix 20 mg for as needed use. -No additional medication changes today.  I have continued to recommend leg elevation when able and use of compression stockings.

## 2022-03-13 NOTE — Progress Notes (Signed)
Established Patient Office Visit  Subjective   Patient ID: Dylan Robles, male    DOB: June 26, 1940  Age: 82 y.o. MRN: [id number]  Chief Complaint  Patient presents with   Hypertension    Follow up   Dylan Robles returns to care today for HTN follow-up.  He was last seen by me on 2/5 for routine care.  At that time he endorsed bilateral lower extremity edema.  Amlodipine 5 mg daily was discontinued in case this was contributing to his edema.  Imdur was increased to 60 mg daily.  Lisinopril and metoprolol succinate were continued at the current doses.  4-week follow-up was arranged.  He was also referred to nephrology in the setting of CKD 3A.  There have been no acute interval events.  Dylan Robles reports feeling well today.  He is asymptomatic and has no additional concerns to discuss.  He states that his edema is largely unchanged.  Past Medical History:  Diagnosis Date   Acute lower UTI 09/20/2017   Bradycardia on ECG 07/31/2011   Coronary artery disease    hyperlipidemia   Decreased GFR 09/23/2019   Dehydration 09/21/2017   Diabetes mellitus    Diabetes mellitus without complication ([place])    Hypercholesterolemia    Hypertension    Hypothyroidism    Left arm pain 01/29/2020   Paronychia of great toe of left foot 09/26/2018   Paronychia of great toe, left 09/26/2018   PERIPHERAL NEUROPATHY 01/13/2006   Qualifier: Diagnosis of  By: Truett Mainland MD, Christine     Shortness of breath dyspnea    with exertion   Vasovagal syncope    Weakness 09/20/2017   Past Surgical History:  Procedure Laterality Date   APPENDECTOMY     CARDIAC CATHETERIZATION N/A 06/16/2014   Procedure: Left Heart Cath;  Surgeon: Dionisio David, MD;  Location: [facility];  Service: Cardiovascular;  Laterality: N/A;   CIRCUMCISION, NON-NEWBORN     HERNIA REPAIR     LOWER EXTREMITY ANGIOGRAPHY Left 12/24/2018   Procedure: LOWER EXTREMITY ANGIOGRAPHY;  Surgeon: Katha Cabal, MD;  Location: [facility];   Service: Cardiovascular;  Laterality: Left;   THYROID SURGERY     Social History   Tobacco Use   Smoking status: Former   Smokeless tobacco: Never  Vaping Use   Vaping Use: Never used  Substance Use Topics   Alcohol use: No   Drug use: No   Family History  Problem Relation Age of Onset   Diabetes Father    Hypertension Father    Cancer Mother    Diabetes Brother    Allergies  Allergen Reactions   Plavix [Clopidogrel] Hives   Review of Systems  Constitutional:  Negative for chills and fever.  HENT:  Negative for sore throat.   Respiratory:  Negative for cough and shortness of breath.   Cardiovascular:  Positive for leg swelling (Transient bilateral lower extremity edema). Negative for chest pain and palpitations.  Gastrointestinal:  Negative for abdominal pain, blood in stool, constipation, diarrhea, nausea and vomiting.  Genitourinary:  Negative for dysuria and hematuria.  Musculoskeletal:  Negative for myalgias.  Skin:  Negative for itching and rash.  Neurological:  Negative for dizziness and headaches.  Psychiatric/Behavioral:  Negative for depression and suicidal ideas.      Objective:     BP (!) 141/65   Pulse 70   Ht '6\' 6"'$  (1.981 m)   Wt 238 lb 12.8 oz (108.3 kg)   SpO2  96%   BMI 27.60 kg/m  BP Readings from Last 3 Encounters:  03/13/22 (!) 141/65  02/13/22 128/67  01/18/22 (!) 140/78   Physical Exam Vitals reviewed.  Constitutional:      General: He is not in acute distress.    Appearance: Normal appearance. He is not ill-appearing.  HENT:     Head: Normocephalic and atraumatic.     Right Ear: External ear normal.     Left Ear: External ear normal.     Nose: Nose normal. No congestion or rhinorrhea.     Mouth/Throat:     Mouth: Mucous membranes are moist.     Pharynx: Oropharynx is clear.  Eyes:     General: No scleral icterus.    Extraocular Movements: Extraocular movements intact.     Conjunctiva/sclera: Conjunctivae normal.     Pupils:  Pupils are equal, round, and reactive to light.  Cardiovascular:     Rate and Rhythm: Normal rate and regular rhythm.     Pulses: Normal pulses.     Heart sounds: Normal heart sounds. No murmur heard. Pulmonary:     Effort: Pulmonary effort is normal.     Breath sounds: Normal breath sounds. No wheezing, rhonchi or rales.  Abdominal:     General: Abdomen is flat. Bowel sounds are normal. There is no distension.     Palpations: Abdomen is soft.     Tenderness: There is no abdominal tenderness.  Musculoskeletal:        General: No swelling or deformity. Normal range of motion.     Cervical back: Normal range of motion.     Right lower leg: No edema.     Left lower leg: No edema.  Skin:    General: Skin is warm and dry.     Capillary Refill: Capillary refill takes less than 2 seconds.  Neurological:     General: No focal deficit present.     Mental Status: He is alert and oriented to person, place, and time.     Motor: No weakness.  Psychiatric:        Mood and Affect: Mood normal.        Behavior: Behavior normal.        Thought Content: Thought content normal.   Last CBC Lab Results  Component Value Date   WBC 6.9 11/10/2021   HGB 11.8 (L) 11/10/2021   HCT 36.3 (L) 11/10/2021   MCV 92 11/10/2021   MCH 29.9 11/10/2021   RDW 13.6 11/10/2021   PLT 258 [id number]   Last metabolic panel Lab Results  Component Value Date   GLUCOSE 155 (H) 02/13/2022   NA 141 02/13/2022   K 4.9 02/13/2022   CL 106 02/13/2022   CO2 21 02/13/2022   BUN 19 02/13/2022   CREATININE 1.62 (H) 02/13/2022   EGFR 42 (L) 02/13/2022   CALCIUM 9.7 02/13/2022   PROT 6.3 02/13/2022   ALBUMIN 3.9 02/13/2022   LABGLOB 2.4 02/13/2022   AGRATIO 1.6 02/13/2022   BILITOT 0.6 02/13/2022   ALKPHOS 131 (H) 02/13/2022   AST 26 02/13/2022   ALT 41 02/13/2022   ANIONGAP 11 05/12/2021   Last lipids Lab Results  Component Value Date   CHOL 101 06/14/2021   HDL 33 (L) 06/14/2021   LDLCALC 55 06/14/2021    TRIG 53 06/14/2021   CHOLHDL 3.1 06/14/2021   Last hemoglobin A1c Lab Results  Component Value Date   HGBA1C 8.3 (A) 01/18/2022   Last thyroid functions Lab Results  Component Value Date   TSH 2.140 12/23/2021   Last vitamin D Lab Results  Component Value Date   VD25OH 34.2 11/10/2021   Last vitamin B12 and Folate Lab Results  Component Value Date   [id number] 11/10/2021   FOLATE 14.3 11/10/2021     Assessment & Plan:   Problem List Items Addressed This Visit       Essential hypertension - Primary    Presenting today for HTN follow-up.  Amlodipine was discontinued at his last appointment in case this was contributing to bilateral lower extremity edema.  Imdur was increased to 60 mg daily.  He is also prescribed lisinopril 20 mg daily and Toprol-XL 50 mg daily.  His blood pressure today is 141/65. -No additional medication changes today.  His blood pressure is acceptable. -Follow-up in 3 months.  In the interim I have asked his wife to periodically check his blood pressure at home and keep a log.      Bilateral lower extremity edema    Amlodipine was discontinued at his last appointment in case this was contributing to bilateral lower extreme edema.  His symptoms are largely unchanged today.  There is trace lower extremity edema present bilaterally on exam today.  He continues to deny orthopnea/PND and dyspnea on exertion.  His edema is likely due to chronic venous insufficiency.  He is prescribed Lasix 20 mg for as needed use. -No additional medication changes today.  I have continued to recommend leg elevation when able and use of compression stockings.       Return in about 3 months (around 06/13/2022).    Johnette Abraham, MD

## 2022-03-13 NOTE — Patient Instructions (Signed)
It was a pleasure to see you today.  Thank you for giving [place] the opportunity to be involved in your care.  Below is a brief recap of your visit and next steps.  We will plan to see you again in 3 months.  Summary No medication changes today. Blood pressure is acceptable. We will follow up in 3 months

## 2022-03-13 NOTE — Assessment & Plan Note (Signed)
Presenting today for HTN follow-up.  Amlodipine was discontinued at his last appointment in case this was contributing to bilateral lower extremity edema.  Imdur was increased to 60 mg daily.  He is also prescribed lisinopril 20 mg daily and Toprol-XL 50 mg daily.  His blood pressure today is 141/65. -No additional medication changes today.  His blood pressure is acceptable. -Follow-up in 3 months.  In the interim I have asked his wife to periodically check his blood pressure at home and keep a log.

## 2022-03-25 NOTE — Progress Notes (Signed)
MRN : [id number]  Dylan Robles is a 82 y.o. (11/07/1940) male who presents with chief complaint of check circulation.  History of Present Illness:  Patient is here for follow-up and review of noninvasive studies.  He is s/p past angiogram on 12/24/2018 due to lower extremity ulceration.  His wound has healed.   There have been no interval changes in lower extremity symptoms. No interval shortening of the patient's claudication distance or development of rest pain symptoms. No new ulcers or wounds have occurred since the last visit.  He also notes increased edema at the end of the day   There have been no significant changes to the patient's overall health care.   The patient denies amaurosis fugax or recent TIA symptoms. There are no recent neurological changes noted. The patient denies history of DVT, PE or superficial thrombophlebitis. The patient denies recent episodes of angina or shortness of breath.    ABI's Rt=0.88 and Lt=0.99  (previous ABI's Rt=0.73 and Lt=0.77)   Previous left lower extremity arterial duplex reveals biphasic/triphasic waveforms down to the level of the tibial arteries (stent is patent) where it transitions to monophasic flow throughout the calf vessels, essentially unchanged compared to last study.   No outpatient medications have been marked as taking for the 03/27/22 encounter (Appointment) with Delana Meyer, Dolores Lory, MD.    Past Medical History:  Diagnosis Date   Acute lower UTI 09/20/2017   Bradycardia on ECG 07/31/2011   Coronary artery disease    hyperlipidemia   Decreased GFR 09/23/2019   Dehydration 09/21/2017   Diabetes mellitus    Diabetes mellitus without complication ([place])    Hypercholesterolemia    Hypertension    Hypothyroidism    Left arm pain 01/29/2020   Paronychia of great toe of left foot 09/26/2018   Paronychia of great toe, left 09/26/2018   PERIPHERAL NEUROPATHY 01/13/2006   Qualifier: Diagnosis of  By: Truett Mainland MD,  Christine     Shortness of breath dyspnea    with exertion   Vasovagal syncope    Weakness 09/20/2017    Past Surgical History:  Procedure Laterality Date   APPENDECTOMY     CARDIAC CATHETERIZATION N/A 06/16/2014   Procedure: Left Heart Cath;  Surgeon: Dionisio David, MD;  Location: [facility];  Service: Cardiovascular;  Laterality: N/A;   CIRCUMCISION, NON-NEWBORN     HERNIA REPAIR     LOWER EXTREMITY ANGIOGRAPHY Left 12/24/2018   Procedure: LOWER EXTREMITY ANGIOGRAPHY;  Surgeon: Katha Cabal, MD;  Location: [facility];  Service: Cardiovascular;  Laterality: Left;   THYROID SURGERY      Social History Social History   Tobacco Use   Smoking status: Former   Smokeless tobacco: Never  Scientific laboratory technician Use: Never used  Substance Use Topics   Alcohol use: No   Drug use: No    Family History Family History  Problem Relation Age of Onset   Diabetes Father    Hypertension Father    Cancer Mother    Diabetes Brother     Allergies  Allergen Reactions   Plavix [Clopidogrel] Hives     REVIEW OF SYSTEMS (Negative unless checked)  Constitutional: [] Weight loss  [] Fever  [] Chills Cardiac: [] Chest pain   [] Chest pressure   [] Palpitations   [] Shortness of breath when laying flat   [] Shortness of breath with exertion. Vascular:  [x] Pain in legs  with walking   [] Pain in legs at rest  [] History of DVT   [] Phlebitis   [] Swelling in legs   [] Varicose veins   [] Non-healing ulcers Pulmonary:   [] Uses home oxygen   [] Productive cough   [] Hemoptysis   [] Wheeze  [] COPD   [] Asthma Neurologic:  [] Dizziness   [] Seizures   [] History of stroke   [] History of TIA  [] Aphasia   [] Vissual changes   [] Weakness or numbness in arm   [] Weakness or numbness in leg Musculoskeletal:   [] Joint swelling   [] Joint pain   [] Low back pain Hematologic:  [] Easy bruising  [] Easy bleeding   [] Hypercoagulable state   [] Anemic Gastrointestinal:  [] Diarrhea   [] Vomiting   [] Gastroesophageal reflux/heartburn   [] Difficulty swallowing. Genitourinary:  [] Chronic kidney disease   [] Difficult urination  [] Frequent urination   [] Blood in urine Skin:  [] Rashes   [] Ulcers  Psychological:  [] History of anxiety   []  History of major depression.  Physical Examination  There were no vitals filed for this visit. There is no height or weight on file to calculate BMI. Gen: WD/WN, NAD Head: Center/AT, No temporalis wasting.  Ear/Nose/Throat: Hearing grossly intact, nares w/o erythema or drainage Eyes: PER, EOMI, sclera nonicteric.  Neck: Supple, no masses.  No bruit or JVD.  Pulmonary:  Good air movement, no audible wheezing, no use of accessory muscles.  Cardiac: RRR, normal S1, S2, no Murmurs. Vascular:  mild trophic changes, no open wounds Vessel Right Left  Radial Palpable Palpable  PT Not Palpable Not Palpable  DP Not Palpable Not Palpable  Gastrointestinal: soft, non-distended. No guarding/no peritoneal signs.  Musculoskeletal: M/S 5/5 throughout.  No visible deformity.  Neurologic: CN 2-12 intact. Pain and light touch intact in extremities.  Symmetrical.  Speech is fluent. Motor exam as listed above. Psychiatric: Judgment intact, Mood & affect appropriate for pt's clinical situation. Dermatologic: No rashes or ulcers noted.  No changes consistent with cellulitis.   CBC Lab Results  Component Value Date   WBC 6.9 11/10/2021   HGB 11.8 (L) 11/10/2021   HCT 36.3 (L) 11/10/2021   MCV 92 11/10/2021   PLT 258 11/10/2021    BMET    Component Value Date/Time   NA 141 02/13/2022 1352   K 4.9 02/13/2022 1352   CL 106 02/13/2022 1352   CO2 21 02/13/2022 1352   GLUCOSE 155 (H) 02/13/2022 1352   GLUCOSE 295 (H) 05/12/2021 2120   BUN 19 02/13/2022 1352   CREATININE 1.62 (H) 02/13/2022 1352   CREATININE 1.34 (H) 02/02/2021 0839   CALCIUM 9.7 02/13/2022 1352   GFRNONAA 44 (L) 05/12/2021 2120   GFRNONAA 54 (L) 04/29/2020 1141   GFRAA 63 04/29/2020 1141   CrCl  cannot be calculated (Patient's most recent lab result is older than the maximum 21 days allowed.).  COAG Lab Results  Component Value Date   INR 1.0 05/12/2021   PROTIME 12.7 06/09/2014    Radiology No results found.   Assessment/Plan 1. Atherosclerosis of native artery of both lower extremities with intermittent claudication (HCC)  Recommend:  The patient has evidence of atherosclerosis of the lower extremities with claudication.  The patient does not voice lifestyle limiting changes at this point in time.  Noninvasive studies do not suggest clinically significant change.  No invasive studies, angiography or surgery at this time The patient should continue walking and begin a more formal exercise program.  The patient should continue antiplatelet therapy and aggressive treatment of the lipid abnormalities  No changes  in the patient's medications at this time.  I have recommended moderate strength graduated compression socks for the edema  Continued surveillance is indicated as atherosclerosis is likely to progress with time.    The patient will continue follow up with noninvasive studies as ordered.  - VAS [place] ABI WITH/WO TBI; Future  2. Type 2 diabetes mellitus with vascular disease ([place]) Continue hypoglycemic medications as already ordered, these medications have been reviewed and there are no changes at this time.  Hgb A1C to be monitored as already arranged by primary service  3. Coronary artery disease of native artery of native heart with stable angina pectoris (HCC) Continue cardiac and antihypertensive medications as already ordered and reviewed, no changes at this time.  Continue statin as ordered and reviewed, no changes at this time  Nitrates PRN for chest pain  4. Essential hypertension Continue antihypertensive medications as already ordered, these medications have been reviewed and there are no changes at this time.  5. Mixed hyperlipidemia Continue  statin as ordered and reviewed, no changes at this time    Hortencia Pilar, MD  03/25/2022 11:13 AM

## 2022-03-27 ENCOUNTER — Ambulatory Visit (INDEPENDENT_AMBULATORY_CARE_PROVIDER_SITE_OTHER): Payer: Medicare HMO | Admitting: Vascular Surgery

## 2022-03-27 ENCOUNTER — Encounter (INDEPENDENT_AMBULATORY_CARE_PROVIDER_SITE_OTHER): Payer: Self-pay | Admitting: Vascular Surgery

## 2022-03-27 ENCOUNTER — Ambulatory Visit (INDEPENDENT_AMBULATORY_CARE_PROVIDER_SITE_OTHER): Payer: Medicare HMO

## 2022-03-27 VITALS — BP 158/92 | HR 62 | Resp 16 | Wt 238.0 lb

## 2022-03-27 DIAGNOSIS — I70213 Atherosclerosis of native arteries of extremities with intermittent claudication, bilateral legs: Secondary | ICD-10-CM

## 2022-03-27 DIAGNOSIS — I10 Essential (primary) hypertension: Secondary | ICD-10-CM | POA: Diagnosis not present

## 2022-03-27 DIAGNOSIS — I25118 Atherosclerotic heart disease of native coronary artery with other forms of angina pectoris: Secondary | ICD-10-CM | POA: Diagnosis not present

## 2022-03-27 DIAGNOSIS — E782 Mixed hyperlipidemia: Secondary | ICD-10-CM | POA: Diagnosis not present

## 2022-03-27 DIAGNOSIS — E1159 Type 2 diabetes mellitus with other circulatory complications: Secondary | ICD-10-CM | POA: Diagnosis not present

## 2022-03-30 ENCOUNTER — Encounter: Payer: Self-pay | Admitting: Podiatry

## 2022-03-30 ENCOUNTER — Ambulatory Visit: Payer: Medicare HMO | Admitting: Podiatry

## 2022-03-30 DIAGNOSIS — M79674 Pain in right toe(s): Secondary | ICD-10-CM

## 2022-03-30 DIAGNOSIS — M79675 Pain in left toe(s): Secondary | ICD-10-CM | POA: Diagnosis not present

## 2022-03-30 DIAGNOSIS — E1142 Type 2 diabetes mellitus with diabetic polyneuropathy: Secondary | ICD-10-CM | POA: Diagnosis not present

## 2022-03-30 DIAGNOSIS — B351 Tinea unguium: Secondary | ICD-10-CM

## 2022-03-30 NOTE — Progress Notes (Signed)
This patient returns to my office for at risk foot care.  This patient requires this care by a professional since this patient will be at risk due to having diabetes and PVD.  This patient is unable to cut nails himself since the patient cannot reach his nails.These nails are painful walking and wearing shoes.  This patient presents for at risk foot care today.  General Appearance  Alert, conversant and in no acute stress.  Vascular  Dorsalis pedis and posterior tibial  pulses are weakly palpable  bilaterally.  Capillary return is within normal limits  bilaterally. Cold feet  Bilaterally. Absent digital hair.  Swelling foot 1/4  Neurologic  Senn-Weinstein monofilament wire test within normal limits right..LOPS absent left foot. Muscle power within normal limits bilaterally.  Nails Thick disfigured discolored nails with subungual debris  from hallux to fifth toes bilaterally. No evidence of bacterial infection or drainage bilaterally.  Orthopedic  No limitations of motion  feet .  No crepitus or effusions noted.  No bony pathology or digital deformities noted.  Skin  normotropic skin with no porokeratosis noted bilaterally.  No signs of infections or ulcers noted.     Onychomycosis  Pain in right toes  Pain in left toes  Consent was obtained for treatment procedures.   Mechanical debridement of nails 1-5  bilaterally performed with a nail nipper.  Filed with dremel without incident.    Return office visit   3 months                   Told patient to return for periodic foot care and evaluation due to potential at risk complications.   Gerardine Peltz DPM  

## 2022-04-03 ENCOUNTER — Ambulatory Visit (INDEPENDENT_AMBULATORY_CARE_PROVIDER_SITE_OTHER): Payer: Medicare HMO | Admitting: Internal Medicine

## 2022-04-03 ENCOUNTER — Encounter: Payer: Self-pay | Admitting: Internal Medicine

## 2022-04-03 ENCOUNTER — Ambulatory Visit: Payer: Medicare HMO | Admitting: Nurse Practitioner

## 2022-04-03 ENCOUNTER — Encounter (INDEPENDENT_AMBULATORY_CARE_PROVIDER_SITE_OTHER): Payer: Self-pay | Admitting: Vascular Surgery

## 2022-04-03 DIAGNOSIS — Z0000 Encounter for general adult medical examination without abnormal findings: Secondary | ICD-10-CM

## 2022-04-03 LAB — VAS US ABI WITH/WO TBI
Left ABI: 0.99
Right ABI: 0.88

## 2022-04-03 NOTE — Patient Instructions (Signed)
  Mr. Thagard , Thank you for taking time to come for your Medicare Wellness Visit. I appreciate your ongoing commitment to your health goals. Please review the following plan we discussed and let me know if I can assist you in the future.   These are the goals we discussed: Patient will stay active by walking daily.   This is a list of the screening recommended for you and due dates:  Health Maintenance  Topic Date Due   COVID-19 Vaccine (4 - 2023-24 season) 09/09/2021   Hemoglobin A1C  04/19/2022   Eye exam for diabetics  04/29/2022   Complete foot exam   09/23/2022   Yearly kidney health urinalysis for diabetes  11/11/2022   Yearly kidney function blood test for diabetes  02/14/2023   Medicare Annual Wellness Visit  04/03/2023   DTaP/Tdap/Td vaccine (3 - Td or Tdap) 06/11/2031   Pneumonia Vaccine  Completed   Flu Shot  Completed   Zoster (Shingles) Vaccine  Completed   HPV Vaccine  Aged Out

## 2022-04-03 NOTE — Progress Notes (Signed)
Subjective:  This is a telephone encounter between Dylan Robles and Lorene Dy on 04/03/2022 for AWV. The visit was conducted with the patient located at home and Lorene Dy at [facility]. The patient's identity was confirmed using their DOB and current address. The patient has consented to being evaluated through a telephone encounter and understands the associated risks (an examination cannot be done and the patient may need to come in for an appointment) / benefits (allows the patient to remain at home, decreasing exposure to coronavirus).     Dylan Robles is a 82 y.o. male who presents for an Initial Medicare Annual Wellness Visit.  Review of Systems    Review of Systems  All other systems reviewed and are negative.     Objective:    Today's Vitals   04/03/22 0807  PainSc: 0-No pain   There is no height or weight on file to calculate BMI.     04/03/2022    8:09 AM 05/12/2021    8:50 PM 03/29/2021    8:21 AM 12/24/2018   12:58 PM 09/20/2017   11:46 PM 09/20/2017    7:11 PM 09/18/2017    8:03 PM  Advanced Directives  Does Patient Have a Medical Advance Directive? No No No No Yes No No  Type of Personnel officer;Living will    Does patient want to make changes to medical advance directive?     No - Patient declined    Would patient like information on creating a medical advance directive? Yes (ED - Information included in AVS) No - Patient declined No - Patient declined Yes (MAU/Ambulatory/Procedural Areas - Information given) No - Patient declined  No - Patient declined    Current Medications (verified) Outpatient Encounter Medications as of 04/03/2022  Medication Sig   aspirin EC 81 MG tablet Take 81 mg by mouth daily.    atorvastatin (LIPITOR) 80 MG tablet TAKE 1 TABLET AT BEDTIME.   BRILINTA 60 MG TABS tablet Take 60 mg by mouth 2 (two) times daily.   cetirizine (ZYRTEC) 5 MG tablet Take 1 tablet (5 mg total) by mouth daily.   Continuous  Blood Gluc Sensor (FREESTYLE LIBRE 2 SENSOR) MISC Use to check glucose at least 4 times daily   ezetimibe (ZETIA) 10 MG tablet Take 10 mg by mouth daily.   Fish Oil-Vitamin D 1000-1000 MG-UNIT CAPS Take by mouth once. daily   furosemide (LASIX) 20 MG tablet Take 20 mg by mouth as needed.   gabapentin (NEURONTIN) 300 MG capsule Take 1 capsule (300 mg total) by mouth 3 (three) times daily.   Glucagon (GVOKE HYPOPEN 2-PACK) 1 MG/0.2ML SOAJ Inject 1 mg into the skin daily as needed.   insulin aspart (NOVOLOG) 100 UNIT/ML injection Inject 5-11 Units into the skin 3 (three) times daily before meals.   insulin degludec (TRESIBA FLEXTOUCH) 100 UNIT/ML FlexTouch Pen Inject 16 Units into the skin at bedtime.   isosorbide mononitrate (IMDUR) 60 MG 24 hr tablet Take 1 tablet (60 mg total) by mouth daily.   levothyroxine (SYNTHROID) 88 MCG tablet Take 1 tablet (88 mcg total) by mouth daily before breakfast.   lisinopril (ZESTRIL) 20 MG tablet Take 1 tablet (20 mg total) by mouth daily.   metoprolol succinate (TOPROL-XL) 50 MG 24 hr tablet Take 1 tablet (50 mg total) by mouth daily.   tamsulosin (FLOMAX) 0.4 MG CAPS capsule TAKE 1 CAPSULE AT BEDTIME   No facility-administered encounter medications  on file as of 04/03/2022.    Allergies (verified) Plavix [clopidogrel]   History: Past Medical History:  Diagnosis Date   Acute lower UTI 09/20/2017   Bradycardia on ECG 07/31/2011   Coronary artery disease    hyperlipidemia   Decreased GFR 09/23/2019   Dehydration 09/21/2017   Diabetes mellitus    Diabetes mellitus without complication ([place])    Hypercholesterolemia    Hypertension    Hypothyroidism    Left arm pain 01/29/2020   Paronychia of great toe of left foot 09/26/2018   Paronychia of great toe, left 09/26/2018   PERIPHERAL NEUROPATHY 01/13/2006   Qualifier: Diagnosis of  By: Truett Mainland MD, Christine     Shortness of breath dyspnea    with exertion   Vasovagal syncope    Weakness 09/20/2017   Past  Surgical History:  Procedure Laterality Date   APPENDECTOMY     CARDIAC CATHETERIZATION N/A 06/16/2014   Procedure: Left Heart Cath;  Surgeon: Dionisio David, MD;  Location: [facility];  Service: Cardiovascular;  Laterality: N/A;   CIRCUMCISION, NON-NEWBORN     HERNIA REPAIR     LOWER EXTREMITY ANGIOGRAPHY Left 12/24/2018   Procedure: LOWER EXTREMITY ANGIOGRAPHY;  Surgeon: Katha Cabal, MD;  Location: [facility];  Service: Cardiovascular;  Laterality: Left;   THYROID SURGERY     Family History  Problem Relation Age of Onset   Diabetes Father    Hypertension Father    Cancer Mother    Diabetes Brother    Social History   Socioeconomic History   Marital status: Married    Spouse name: Not on file   Number of children: Not on file   Years of education: Not on file   Highest education level: Not on file  Occupational History   Occupation: Truck Geophysicist/field seismologist    Comment: Retired  Tobacco Use   Smoking status: Former   Smokeless tobacco: Never  Scientific laboratory technician Use: Never used  Substance and Sexual Activity   Alcohol use: No   Drug use: No   Sexual activity: Not on file  Other Topics Concern   Not on file  Social History Narrative   ** Merged History Encounter **       Social Determinants of Health   Financial Resource Strain: Low Risk  (03/29/2021)   Overall Financial Resource Strain (CARDIA)    Difficulty of Paying Living Expenses: Not very hard  Food Insecurity: No Food Insecurity (03/29/2021)   Hunger Vital Sign    Worried About Running Out of Food in the Last Year: Never true    Ran Out of Food in the Last Year: Never true  Transportation Needs: No Transportation Needs (03/29/2021)   PRAPARE - Hydrologist (Medical): No    Lack of Transportation (Non-Medical): No  Physical Activity: Insufficiently Active (03/29/2021)   Exercise Vital Sign    Days of Exercise per Week: 7 days    Minutes of Exercise per Session: 20  min  Stress: No Stress Concern Present (03/29/2021)   Tar Heel    Feeling of Stress : Not at all  Social Connections: [place] (03/29/2021)   Social Connection and Isolation Panel [NHANES]    Frequency of Communication with Friends and Family: More than three times a week    Frequency of Social Gatherings with Friends and Family: More than three times a week    Attends Religious  Services: More than 4 times per year    Active Member of Clubs or Organizations: Yes    Attends Archivist Meetings: 1 to 4 times per year    Marital Status: Married    Tobacco Counseling Counseling given: Not Answered   Clinical Intake:  Pre-visit preparation completed: Yes  Pain : No/denies pain Pain Score: 0-No pain     Diabetes: Yes CBG done?: No Did pt. bring in CBG monitor from home?: No  How often do you need to have someone help you when you read instructions, pamphlets, or other written materials from your doctor or pharmacy?: 1 - Never What is the last grade level you completed in school?: 12th grade  Interpreter Needed?: No    Activities of Daily Living    04/03/2022    8:11 AM  In your present state of health, do you have any difficulty performing the following activities:  Hearing? 0  Vision? 0  Difficulty concentrating or making decisions? 0  Walking or climbing stairs? 0  Dressing or bathing? 0  Doing errands, shopping? 0    Patient Care Team: Johnette Abraham, MD as PCP - General (Internal Medicine) Buelah Manis, Modena Nunnery, MD (Family Medicine) Edythe Clarity, [facility] as Pharmacist (Pharmacist)  Indicate any recent Medical Services you may have received from other than [facility] providers in the past year (date may be approximate).     Assessment:   This is a routine wellness examination for Sonnie.  Hearing/Vision screen No results found.  Dietary issues and exercise activities  discussed:     Goals Addressed   None    Depression Screen    04/03/2022    8:11 AM 03/13/2022    1:51 PM 02/13/2022    1:14 PM 11/10/2021    1:09 PM 08/12/2021    3:39 PM 06/10/2021    1:14 PM 05/17/2021    8:05 AM  PHQ 2/9 Scores  PHQ - 2 Score 0 0 0 0 0 0 0  PHQ- 9 Score  1 0   0 0    Fall Risk    04/03/2022    8:11 AM 03/13/2022    1:51 PM 02/13/2022    1:14 PM 11/10/2021    1:08 PM 08/12/2021    3:39 PM  Friendly in the past year? 0 0 0 0 0  Number falls in past yr:  0 0 0 0  Injury with Fall?  0 0 0 0  Risk for fall due to :  No Fall Risks No Fall Risks No Fall Risks No Fall Risks  Follow up  Falls evaluation completed Falls evaluation completed Falls evaluation completed Falls evaluation completed    FALL RISK PREVENTION PERTAINING TO THE HOME:  Any stairs in or around the home? No  If so, are there any without handrails? No  Home free of loose throw rugs in walkways, pet beds, electrical cords, etc? Yes  Adequate lighting in your home to reduce risk of falls? Yes   ASSISTIVE DEVICES UTILIZED TO PREVENT FALLS:  Life alert? No  Use of a cane, walker or w/c? Yes  Grab bars in the bathroom? Yes  Shower chair or bench in shower? Yes  Elevated toilet seat or a handicapped toilet? Yes    Cognitive Function:        04/03/2022    8:12 AM 03/29/2021    8:21 AM  6CIT Screen  What Year? 0 points 0 points  What month?  0 points 0 points  What time? 0 points 0 points  Count back from 20  0 points  Months in reverse  0 points  Repeat phrase  0 points  Total Score  0 points    Immunizations Immunization History  Administered Date(s) Administered   Fluad Quad(high Dose 65+) 09/22/2019, 11/03/2020, 11/10/2021   Influenza Whole 10/05/2005, 12/17/2006   Influenza,inj,quad, With Preservative 11/25/2018   Moderna Sars-Covid-2 Vaccination 03/06/2019, 04/03/2019, 01/06/2020   Pneumococcal Conjugate-13 01/29/2020   Pneumococcal Polysaccharide-23 08/21/2005   Td  12/17/2006   Tdap 06/10/2021   Zoster Recombinat (Shingrix) 06/03/2021, 09/20/2021    TDAP status: Up to date  Flu Vaccine status: Up to date  Pneumococcal vaccine status: Up to date  Covid-19 vaccine status: Information provided on how to obtain vaccines.   Qualifies for Shingles Vaccine? Yes   Zostavax completed No   Shingrix Completed?: Yes  Screening Tests Health Maintenance  Topic Date Due   COVID-19 Vaccine (4 - 2023-24 season) 09/09/2021   HEMOGLOBIN A1C  04/19/2022   OPHTHALMOLOGY EXAM  04/29/2022   FOOT EXAM  09/23/2022   Diabetic kidney evaluation - Urine ACR  11/11/2022   Diabetic kidney evaluation - eGFR measurement  02/14/2023   Medicare Annual Wellness (AWV)  04/03/2023   DTaP/Tdap/Td (3 - Td or Tdap) 06/11/2031   Pneumonia Vaccine 46+ Years old  Completed   INFLUENZA VACCINE  Completed   Zoster Vaccines- Shingrix  Completed   HPV VACCINES  Aged Out    Health Maintenance  Health Maintenance Due  Topic Date Due   COVID-19 Vaccine (4 - 2023-24 season) 09/09/2021    Colorectal cancer screening: No longer required.   Lung Cancer Screening: (Low Dose CT Chest recommended if Age 22-80 years, 30 pack-year currently smoking OR have quit w/in 15years.) does not qualify.    Additional Screening:  Hepatitis C Screening: does not qualify  Vision Screening: Recommended annual ophthalmology exams for early detection of glaucoma and other disorders of the eye. Is the patient up to date with their annual eye exam?  Yes  Who is the provider or what is the name of the office in which the patient attends annual eye exams? [facility] If pt is not established with a provider, would they like to be referred to a provider to establish care? No .   Dental Screening: Recommended annual dental exams for proper oral hygiene  Community Resource Referral / Chronic Care Management: CRR required this visit?  No   CCM required this visit?  No      Plan:     I have  personally reviewed and noted the following in the patient's chart:   Medical and social history Use of alcohol, tobacco or illicit drugs  Current medications and supplements including opioid prescriptions. Patient is not currently taking opioid prescriptions. Functional ability and status Nutritional status Physical activity Advanced directives List of other physicians Hospitalizations, surgeries, and ER visits in previous 12 months Vitals Screenings to include cognitive, depression, and falls Referrals and appointments  In addition, I have reviewed and discussed with patient certain preventive protocols, quality metrics, and best practice recommendations. A written personalized care plan for preventive services as well as general preventive health recommendations were provided to patient.     Lorene Dy, MD   04/03/2022

## 2022-04-05 ENCOUNTER — Other Ambulatory Visit: Payer: Self-pay | Admitting: Cardiovascular Disease

## 2022-04-10 DIAGNOSIS — I5032 Chronic diastolic (congestive) heart failure: Secondary | ICD-10-CM | POA: Diagnosis not present

## 2022-04-10 DIAGNOSIS — N189 Chronic kidney disease, unspecified: Secondary | ICD-10-CM | POA: Diagnosis not present

## 2022-04-10 DIAGNOSIS — D638 Anemia in other chronic diseases classified elsewhere: Secondary | ICD-10-CM | POA: Diagnosis not present

## 2022-04-10 DIAGNOSIS — R809 Proteinuria, unspecified: Secondary | ICD-10-CM | POA: Diagnosis not present

## 2022-04-10 DIAGNOSIS — I129 Hypertensive chronic kidney disease with stage 1 through stage 4 chronic kidney disease, or unspecified chronic kidney disease: Secondary | ICD-10-CM | POA: Diagnosis not present

## 2022-04-10 DIAGNOSIS — Z6828 Body mass index (BMI) 28.0-28.9, adult: Secondary | ICD-10-CM | POA: Diagnosis not present

## 2022-04-10 DIAGNOSIS — E1129 Type 2 diabetes mellitus with other diabetic kidney complication: Secondary | ICD-10-CM | POA: Diagnosis not present

## 2022-04-10 DIAGNOSIS — E1122 Type 2 diabetes mellitus with diabetic chronic kidney disease: Secondary | ICD-10-CM | POA: Diagnosis not present

## 2022-04-10 DIAGNOSIS — E875 Hyperkalemia: Secondary | ICD-10-CM | POA: Diagnosis not present

## 2022-04-12 ENCOUNTER — Other Ambulatory Visit: Payer: Self-pay | Admitting: Cardiovascular Disease

## 2022-04-12 DIAGNOSIS — I10 Essential (primary) hypertension: Secondary | ICD-10-CM

## 2022-04-13 ENCOUNTER — Ambulatory Visit: Payer: Medicare HMO | Attending: Audiologist | Admitting: Audiologist

## 2022-04-13 DIAGNOSIS — H903 Sensorineural hearing loss, bilateral: Secondary | ICD-10-CM | POA: Diagnosis not present

## 2022-04-13 NOTE — Procedures (Signed)
  Outpatient Audiology and [facility], [place]  [id number] [phone]  AUDIOLOGICAL  EVALUATION  NAME: Dylan Robles     DOB:   Jan 31, 1940      MRN: [id number]                                                                                     DATE: 04/13/2022     REFERENT: Johnette Abraham, MD STATUS: Outpatient DIAGNOSIS: Sensorineural Hearing Loss   History: [name] was seen for an audiological evaluation at recommendation of his doctor. [name] feels he misses what people say on occasion but over all he hear well. His wife feels [name] can hear well. [name] denied pain, pressure, and tinnitus. 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 No other case history reported.   Evaluation:  Otoscopy showed a clear view of the tympanic membranes, bilaterally Tympanometry results were consistent with normal middle ear function, bilaterally   Audiometric testing was completed using Conventional Audiometry techniques with insert earphones and TDH headphones. Test results are consistent with normal hearing 250-2kHz sloping to a mild high frequency sensorineural hearing loss. Speech Recognition Thresholds were obtained at 25dB HL in the right ear and at 20dB HL in the left ear. Word Recognition Testing was completed at  65dB HL and Develle scored 96% in the right ear and 100% in the left ear.   Results:  The test results were reviewed with [name] and his wife. [name] has a mild high pitched hearing loss in both ears consistent with age related changes. He does not yet need hearing aids. Recommend annual hearing testing to monitor hearing loss progression.    Recommendations: 1.   No hearing aids needed at this time. Annual hearing testing recommended due to Chronic kidney disease due to type 2 diabetes mellitus .          32 minutes spent testing and counseling on results.   If you have any questions please feel free to contact me at [phone]  [phone].  [name]  Audiologist, Au.D., CCC-A 04/13/2022  3:17 PM  Cc: Johnette Abraham, MD

## 2022-04-18 ENCOUNTER — Ambulatory Visit: Payer: Medicare HMO | Admitting: Cardiovascular Disease

## 2022-04-18 ENCOUNTER — Encounter: Payer: Self-pay | Admitting: Cardiovascular Disease

## 2022-04-18 VITALS — BP 132/80 | HR 88 | Ht 78.0 in | Wt 234.8 lb

## 2022-04-18 DIAGNOSIS — I25118 Atherosclerotic heart disease of native coronary artery with other forms of angina pectoris: Secondary | ICD-10-CM | POA: Diagnosis not present

## 2022-04-18 DIAGNOSIS — I10 Essential (primary) hypertension: Secondary | ICD-10-CM

## 2022-04-18 DIAGNOSIS — E782 Mixed hyperlipidemia: Secondary | ICD-10-CM

## 2022-04-18 DIAGNOSIS — R0602 Shortness of breath: Secondary | ICD-10-CM | POA: Diagnosis not present

## 2022-04-18 NOTE — Progress Notes (Signed)
Cardiology Office Note   Date:  04/18/2022   ID:  Dylan Robles, DOB 26-May-1940, MRN [id number]  PCP:  Billie Lade, MD  Cardiologist:  Adrian Blackwater, MD      History of Present Illness: Dylan Robles is a 82 y.o. male who presents for  Chief Complaint  Patient presents with   Follow-up    3 month follow up    Patient in office for routine cardiac exam. Denies chest pain, edema, palpitations. Patient reports shortness of breath on exertion.   Shortness of Breath This is a recurrent problem. The current episode started 1 to 4 weeks ago. The problem occurs intermittently. The problem has been waxing and waning. The symptoms are aggravated by exercise and any activity. He has tried rest for the symptoms. The treatment provided moderate relief. His past medical history is significant for CAD.   Past Medical History:  Diagnosis Date   Acute lower UTI 09/20/2017   Bradycardia on ECG 07/31/2011   Coronary artery disease    hyperlipidemia   Decreased GFR 09/23/2019   Dehydration 09/21/2017   Diabetes mellitus    Diabetes mellitus without complication    Hypercholesterolemia    Hypertension    Hypothyroidism    Left arm pain 01/29/2020   Paronychia of great toe of left foot 09/26/2018   Paronychia of great toe, left 09/26/2018   PERIPHERAL NEUROPATHY 01/13/2006   Qualifier: Diagnosis of  By: Jen Mow MD, Christine     Shortness of breath dyspnea    with exertion   Vasovagal syncope    Weakness 09/20/2017     Past Surgical History:  Procedure Laterality Date   APPENDECTOMY     CARDIAC CATHETERIZATION N/A 06/16/2014   Procedure: Left Heart Cath;  Surgeon: Laurier Nancy, MD;  Location: [facility];  Service: Cardiovascular;  Laterality: N/A;   CIRCUMCISION, NON-NEWBORN     HERNIA REPAIR     LOWER EXTREMITY ANGIOGRAPHY Left 12/24/2018   Procedure: LOWER EXTREMITY ANGIOGRAPHY;  Surgeon: Renford Dills, MD;  Location: [facility];  Service: Cardiovascular;   Laterality: Left;   THYROID SURGERY       Current Outpatient Medications  Medication Sig Dispense Refill   amLODipine (NORVASC) 5 MG tablet Take 1 tablet by mouth every morning.     aspirin EC 81 MG tablet Take 81 mg by mouth daily.      atorvastatin (LIPITOR) 80 MG tablet TAKE 1 TABLET AT BEDTIME. 90 tablet 1   BRILINTA 60 MG TABS tablet TAKE 1 TABLET TWICE DAILY 180 tablet 3   cetirizine (ZYRTEC) 5 MG tablet Take 1 tablet (5 mg total) by mouth daily. 90 tablet 1   Continuous Blood Gluc Sensor (FREESTYLE LIBRE 2 SENSOR) MISC Use to check glucose at least 4 times daily 6 each 3   ezetimibe (ZETIA) 10 MG tablet Take 10 mg by mouth daily.     Fish Oil-Vitamin D 1000-1000 MG-UNIT CAPS Take by mouth once. daily     furosemide (LASIX) 20 MG tablet Take 20 mg by mouth as needed.     gabapentin (NEURONTIN) 300 MG capsule Take 1 capsule (300 mg total) by mouth 3 (three) times daily. 270 capsule 1   Glucagon (GVOKE HYPOPEN 2-PACK) 1 MG/0.2ML SOAJ Inject 1 mg into the skin daily as needed. 0.4 mL 1   insulin aspart (NOVOLOG) 100 UNIT/ML injection Inject 5-11 Units into the skin 3 (three) times daily before meals.  insulin degludec (TRESIBA FLEXTOUCH) 100 UNIT/ML FlexTouch Pen Inject 16 Units into the skin at bedtime. 15 mL 3   isosorbide mononitrate (IMDUR) 60 MG 24 hr tablet Take 1 tablet (60 mg total) by mouth daily. 30 tablet 2   levothyroxine (SYNTHROID) 88 MCG tablet Take 1 tablet (88 mcg total) by mouth daily before breakfast. 90 tablet 2   lisinopril (ZESTRIL) 20 MG tablet Take 1 tablet (20 mg total) by mouth daily. 90 tablet 3   metoprolol succinate (TOPROL-XL) 50 MG 24 hr tablet TAKE 1 TABLET EVERY DAY 90 tablet 0   tamsulosin (FLOMAX) 0.4 MG CAPS capsule TAKE 1 CAPSULE AT BEDTIME 90 capsule 1   No current facility-administered medications for this visit.    Allergies:   Plavix [clopidogrel]    Social History:   reports that he has quit smoking. He has never used smokeless tobacco.  He reports that he does not drink alcohol and does not use drugs.   Family History:  family history includes Cancer in his mother; Diabetes in his brother and father; Hypertension in his father.    ROS:     Review of Systems  Constitutional: Negative.   HENT: Negative.    Eyes: Negative.   Respiratory:  Positive for shortness of breath.   Cardiovascular: Negative.   Gastrointestinal: Negative.   Genitourinary: Negative.   Musculoskeletal: Negative.   Skin: Negative.   Neurological: Negative.   Endo/Heme/Allergies: Negative.   Psychiatric/Behavioral: Negative.    All other systems reviewed and are negative.   All other systems are reviewed and negative.   PHYSICAL EXAM: VS:  BP 132/80   Pulse 88   Ht 6\' 6"  (1.981 m)   Wt 234 lb 12.8 oz (106.5 kg)   SpO2 97%   BMI 27.13 kg/m  , BMI Body mass index is 27.13 kg/m. Last weight:  Wt Readings from Last 3 Encounters:  04/18/22 234 lb 12.8 oz (106.5 kg)  03/27/22 238 lb (108 kg)  03/13/22 238 lb 12.8 oz (108.3 kg)     Physical Exam Vitals reviewed.  Constitutional:      Appearance: Normal appearance. He is normal weight.  HENT:     Head: Normocephalic.     Nose: Nose normal.     Mouth/Throat:     Mouth: Mucous membranes are moist.  Eyes:     Pupils: Pupils are equal, round, and reactive to light.  Cardiovascular:     Rate and Rhythm: Normal rate and regular rhythm.     Pulses: Normal pulses.     Heart sounds: Normal heart sounds.  Pulmonary:     Effort: Pulmonary effort is normal.  Abdominal:     General: Abdomen is flat. Bowel sounds are normal.  Musculoskeletal:        General: Normal range of motion.     Cervical back: Normal range of motion.  Skin:    General: Skin is warm.  Neurological:     General: No focal deficit present.     Mental Status: He is alert.  Psychiatric:        Mood and Affect: Mood normal.     EKG: none today  Recent Labs: 11/10/2021: Hemoglobin 11.8; Platelets 258 12/23/2021:  TSH 2.140 02/13/2022: ALT 41; BUN 19; Creatinine, Ser 1.62; Potassium 4.9; Sodium 141    Lipid Panel    Component Value Date/Time   CHOL 101 06/14/2021 0916   TRIG 53 06/14/2021 0916   HDL 33 (L) 06/14/2021 0916   CHOLHDL 3.1  06/14/2021 0916   CHOLHDL 3.2 11/03/2020 1032   VLDL 12 06/17/2008 0304   LDLCALC 55 06/14/2021 0916   LDLCALC 73 11/03/2020 1032     Other studies Reviewed: Patient: [id number] - Glynda Jaeger DOB:  Feb 09, 1940  Date:  02/10/2021 10:00 Provider: Adrian Blackwater MD Encounter: ECHO   Page 2 REASON FOR VISIT  Visit for: Echocardiogram/R06.02  Sex:   Male  wt=  218  lbs.  BP=130/82  Height= 78   inches.    TESTS  Imaging: Echocardiogram:  An echocardiogram in (2-d) mode was performed and in Doppler mode with color flow velocity mapping was performed. The aortic valve cusps are abnormal 2.2   cm, flow velocity 1.06   m/s, and systolic calculated mean flow gradient 3  mmHg. Mitral valve diastolic peak flow velocity E .489     m/s and E/A ratio 0.6. Aortic root diameter 2.9   cm. The LVOT internal diameter 3.3 cm and flow velocity was abnormal .752  m/s. LV systolic dimension 3.01   cm, diastolic 4.32  cm, posterior wall thickness 1.05    cm, fractional shortening 30.3 %, and EF 58 %. IVS thickness 1.5   cm. LA dimension 2.6 cm. Mitral Valve has Mild Regurgitation. Tricuspid Valve has Mild Regurgitation.   ASSESSMENT  Technically adequate study.  Normal chamber sizes.  Normal left ventricular systolic function.  Mild left ventricular hypertrophy with GRADE 1 (relaxation abnormality) diastolic dysfunction.  Normal right ventricular systolic function.  Normal right ventricular diastolic function.  Normal left ventricular wall motion.  Normal right ventricular wall motion.  Mild tricuspid regurgitation.  Borderline pulmonary hypertension.  Mild mitral regurgitation.  Fibrocalcified aortic valve.  No pericardial effusion.  Moderately dilated Left atrium   Mildly dilated Right atrium  Moderate LVH.   THERAPY   Referring physician: Laurier Nancy  Sonographer: Adrian Blackwater.   Adrian Blackwater MD  Electronically signed by: Adrian Blackwater     Date: 02/10/2021 13:21  Patient: [id number] - Glynda Jaeger DOB:  06/22/1940  Date:  02/08/2021 07:45 Provider: Adrian Blackwater MD Encounter: [facility] STRESS TEST   Page 1 TESTS                                                                                         [facility] [address] [place], [place] [address] [phone] STUDY:  Gated Stress / Rest Myocardial Perfusion Imaging Tomographic (SPECT) Including attenuation correction Wall Motion, Left Ventricular Ejection Fraction By Gated Technique.Treadmill Stress Test. SEX: Male  WEIGHT: 220 lbs  HEIGHT: 78 in      ARMS UP: YES/NO                                                                        REFERRING PHYSICIAN: Dr.Kolten Ryback Welton Flakes  INDICATION FOR STUDY: R06.02                                                                                                                                                                                                                    TECHNIQUE:  Approximately 20 minutes following the intravenous administration of 10.4 mCi of Tc-77m Sestamibi after stress testing in a reclined supine position with arms above their head if able to do so, gated SPECT imaging of the heart was performed. After about a 2hr break, the patient was injected intravenously with 31.3 mCi of Tc-65m Sestamibi.  Approximately 45 minutes later in the same position as stress imaging SPECT rest imaging of the heart was performed.  STRESS BY:  Adrian Blackwater, MD PROTOCOL:   Modified Bruce                                                                                        MAX PRED HR: 138                     85%: 117               75%: 104                                                                                                                   RESTING BP: 138/82  RESTING HR: 88 PEAK BP: 150/90  PEAK HR: 125 (90%)  EXERCISE DURATION: 2:09                                             METS: 3.7     REASON FOR TEST TERMINATION: Target reached/1 min post injection                                                                                                                                  SYMPTOMS: Fatigue. SOB.  [place]KE TREADMILL SCORE: 2                                       RISK: Moderate                                                                                                                                                                                                           EKG RESULTS: NSR. 90/min. Frequent PVC's, no significant ST changes at peak exercise.                                                             IMAGE QUALITY:  Good  PERFUSION/WALL MOTION FINDINGS: EF = 64%. No perfusion defects, normal wall motion.                                                                           IMPRESSION: Normal stress test with normal LVEF.                                                                                                                                                                                                                                                                                         Adrian BlackwaterShaukat  Vincient Vanaman, MD Stress Interpreting Physician / Nuclear Interpreting Physician        Adrian BlackwaterShaukat Chenel Wernli MD  Electronically signed by: Adrian BlackwaterSHAUKAT Jaimen Melone     Date: 02/10/2021 11:28  Patient: [id number] - Glynda JaegerFrank E. Gartin DOB:  11/23/1940  Date:  06/02/2019 10:00 Provider: Adrian BlackwaterKhan, Kemper Hochman MD Encounter: ALL ANGIOGRAMS (CTA BRAIN, CAROTIDS, RENAL ARTERIES, PE)   Page 2 REASON FOR VISIT  Referred by Dr.Niyana Chesbro Welton FlakesKhan.     TESTS  Imaging: Computed Tomographic Angiography:  Cardiac multidetector CT was performed paying particular attention to the coronary arteries for the diagnosis of: Cornoary angioplasty status. Diagnostic Drugs:  Administered iohexol (Omnipaque) through an antecubital vein and images from the examination were analyzed for the presence and extent of coronary artery disease, using 3D image processing software. 100 mL of non-ionic contrast (Omnipaque) was used.   ASSESSMENT   The left main artery was abnormal:2  The proximal LAD artery are abnormal:3  The mid-LAD artery was abnormal:4  The distal LAD artery was abnormal:4  The proximal circumflex artery was abnormal:4  The distal circumflex artery was abnormal:4  The first obtuse marginal branch artery (OM-1) was abnormal:4  The mid right coronary artery (RCA) was abnormal:4  The distal right coronary artery (RCA) was abnormal:4   TEST CONCLUSIONS  Quality of study: Good.  1-Calcium  score: 4045.6  2-Right dominant system.  3-Severly calcified diffuse 3 vessel disease. Consider Cath.  Adrian Blackwater MD  Electronically signed by: Adrian Blackwater     Date: 06/06/2019 09:37   ASSESSMENT AND PLAN:    ICD-10-CM   1. SOB (shortness of breath)  R06.02 PCV ECHOCARDIOGRAM COMPLETE    2. Coronary artery disease of native artery of native heart with stable angina pectoris  I25.118     3. Essential hypertension  I10     4. Mixed hyperlipidemia  E78.2        Problem List Items Addressed This Visit       Cardiovascular and Mediastinum    Essential hypertension   Relevant Medications   amLODipine (NORVASC) 5 MG tablet   Coronary artery disease of native artery of native heart with stable angina pectoris   Relevant Medications   amLODipine (NORVASC) 5 MG tablet     Other   Hyperlipidemia   Relevant Medications   amLODipine (NORVASC) 5 MG tablet   SOB (shortness of breath) - Primary    Patient complains of shortness of breath with walking. Denies chest pain. Will order an echo to check heart function.       Relevant Orders   PCV ECHOCARDIOGRAM COMPLETE     Disposition:   Return in about 3 weeks (around 05/09/2022) for after echo.    Total time spent: 30 minutes  Signed,  Adrian Blackwater, MD  04/18/2022 10:01 AM    [facility]

## 2022-04-18 NOTE — Assessment & Plan Note (Signed)
Patient complains of shortness of breath with walking. Denies chest pain. Will order an echo to check heart function.

## 2022-04-19 DIAGNOSIS — D638 Anemia in other chronic diseases classified elsewhere: Secondary | ICD-10-CM | POA: Diagnosis not present

## 2022-04-19 DIAGNOSIS — N189 Chronic kidney disease, unspecified: Secondary | ICD-10-CM | POA: Diagnosis not present

## 2022-04-19 DIAGNOSIS — E1129 Type 2 diabetes mellitus with other diabetic kidney complication: Secondary | ICD-10-CM | POA: Diagnosis not present

## 2022-04-19 DIAGNOSIS — I5032 Chronic diastolic (congestive) heart failure: Secondary | ICD-10-CM | POA: Diagnosis not present

## 2022-04-19 DIAGNOSIS — I129 Hypertensive chronic kidney disease with stage 1 through stage 4 chronic kidney disease, or unspecified chronic kidney disease: Secondary | ICD-10-CM | POA: Diagnosis not present

## 2022-04-19 DIAGNOSIS — E875 Hyperkalemia: Secondary | ICD-10-CM | POA: Diagnosis not present

## 2022-04-19 DIAGNOSIS — E559 Vitamin D deficiency, unspecified: Secondary | ICD-10-CM | POA: Diagnosis not present

## 2022-04-19 DIAGNOSIS — E1122 Type 2 diabetes mellitus with diabetic chronic kidney disease: Secondary | ICD-10-CM | POA: Diagnosis not present

## 2022-04-19 DIAGNOSIS — Z6828 Body mass index (BMI) 28.0-28.9, adult: Secondary | ICD-10-CM | POA: Diagnosis not present

## 2022-04-19 DIAGNOSIS — R809 Proteinuria, unspecified: Secondary | ICD-10-CM | POA: Diagnosis not present

## 2022-04-20 ENCOUNTER — Ambulatory Visit: Payer: Medicare HMO | Admitting: Nurse Practitioner

## 2022-04-20 ENCOUNTER — Encounter: Payer: Self-pay | Admitting: Nurse Practitioner

## 2022-04-20 VITALS — BP 144/78 | HR 64 | Ht 78.0 in | Wt 236.3 lb

## 2022-04-20 DIAGNOSIS — Z794 Long term (current) use of insulin: Secondary | ICD-10-CM

## 2022-04-20 DIAGNOSIS — N1832 Chronic kidney disease, stage 3b: Secondary | ICD-10-CM | POA: Diagnosis not present

## 2022-04-20 DIAGNOSIS — E1122 Type 2 diabetes mellitus with diabetic chronic kidney disease: Secondary | ICD-10-CM

## 2022-04-20 DIAGNOSIS — E039 Hypothyroidism, unspecified: Secondary | ICD-10-CM | POA: Diagnosis not present

## 2022-04-20 LAB — POCT GLYCOSYLATED HEMOGLOBIN (HGB A1C): Hemoglobin A1C: 10.1 % — AB (ref 4.0–5.6)

## 2022-04-20 MED ORDER — NOVOLOG FLEXPEN 100 UNIT/ML ~~LOC~~ SOPN: 8.0000 [IU] | PEN_INJECTOR | Freq: Three times a day (TID) | SUBCUTANEOUS | 3 refills | Status: DC

## 2022-04-20 MED ORDER — TRESIBA FLEXTOUCH 100 UNIT/ML ~~LOC~~ SOPN: 16.0000 [IU] | PEN_INJECTOR | Freq: Every day | SUBCUTANEOUS | 3 refills | Status: DC

## 2022-04-20 NOTE — Progress Notes (Signed)
Endocrinology Follow Up Note       04/20/2022, 12:09 PM   Subjective:    Patient ID: Dylan Robles, male    DOB: 08/25/1940.  Dylan Robles is being seen in follow up after being seen in consultation for management of currently uncontrolled symptomatic diabetes requested by  Dylan Ladeixon, Phillip E, MD.   Past Medical History:  Diagnosis Date   Acute lower UTI 09/20/2017   Bradycardia on ECG 07/31/2011   Coronary artery disease    hyperlipidemia   Decreased GFR 09/23/2019   Dehydration 09/21/2017   Diabetes mellitus    Diabetes mellitus without complication    Hypercholesterolemia    Hypertension    Hypothyroidism    Left arm pain 01/29/2020   Paronychia of great toe of left foot 09/26/2018   Paronychia of great toe, left 09/26/2018   PERIPHERAL NEUROPATHY 01/13/2006   Qualifier: Diagnosis of  By: Dylan MowMetz MD, Dylan Robles     Shortness of breath dyspnea    with exertion   Vasovagal syncope    Weakness 09/20/2017    Past Surgical History:  Procedure Laterality Date   APPENDECTOMY     CARDIAC CATHETERIZATION N/A 06/16/2014   Procedure: Left Heart Cath;  Surgeon: Dylan NancyShaukat A Khan, MD;  Location: [facility];  Service: Cardiovascular;  Laterality: N/A;   CIRCUMCISION, NON-NEWBORN     HERNIA REPAIR     LOWER EXTREMITY ANGIOGRAPHY Left 12/24/2018   Procedure: LOWER EXTREMITY ANGIOGRAPHY;  Surgeon: Dylan DillsSchnier, Dylan G, MD;  Location: [facility];  Service: Cardiovascular;  Laterality: Left;   THYROID SURGERY      Social History   Socioeconomic History   Marital status: Married    Spouse name: Not on file   Number of children: Not on file   Years of education: Not on file   Highest education level: Not on file  Occupational History   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  Tobacco Use   Smoking status: Former   Smokeless tobacco: Never  Building services engineerVaping Use   Vaping Use: Never used  Substance and Sexual  Activity   Alcohol use: No   Drug use: No   Sexual activity: Not on file  Other Topics Concern   Not on file  Social History Narrative   ** Merged History Encounter **       Social Determinants of Health   Financial Resource Strain: Low Risk  (03/29/2021)   Overall Financial Resource Strain (CARDIA)    Difficulty of Paying Living Expenses: Not very hard  Food Insecurity: No Food Insecurity (03/29/2021)   Hunger Vital Sign    Worried About Running Out of Food in the Last Year: Never true    Ran Out of Food in the Last Year: Never true  Transportation Needs: No Transportation Needs (03/29/2021)   PRAPARE - Administrator, Civil ServiceTransportation    Lack of Transportation (Medical): No    Lack of Transportation (Non-Medical): No  Physical Activity: Insufficiently Active (03/29/2021)   Exercise Vital Sign    Days of Exercise per Week: 7 days    Minutes of Exercise per Session: 20 min  Stress: No Stress Concern Present (03/29/2021)   [employer] - Occupational Stress Questionnaire    Feeling of Stress : Not at all  Social Connections: Socially Integrated (03/29/2021)   Social Connection and Isolation Panel [NHANES]    Frequency of Communication with Friends and Family: More than three times a week    Frequency of Social Gatherings with Friends and Family: More than three times a week    Attends Religious Services: More than 4 times per year    Active Member of [employer] or Organizations: Yes    Attends Banker Meetings: 1 to 4 times per year    Marital Status: Married    Family History  Problem Relation Age of Onset   Diabetes Father    Hypertension Father    Cancer Mother    Diabetes Brother     Outpatient Encounter Medications as of 04/20/2022  Medication Sig   amLODipine (NORVASC) 5 MG tablet Take 1 tablet by mouth every morning.   aspirin EC 81 MG tablet Take 81 mg by mouth daily.    atorvastatin (LIPITOR) 80 MG tablet TAKE 1 TABLET AT BEDTIME.   BRILINTA 60  MG TABS tablet TAKE 1 TABLET TWICE DAILY   cetirizine (ZYRTEC) 5 MG tablet Take 1 tablet (5 mg total) by mouth daily.   Continuous Blood Gluc Sensor (FREESTYLE LIBRE 2 SENSOR) MISC Use to check glucose at least 4 times daily   ezetimibe (ZETIA) 10 MG tablet Take 10 mg by mouth daily.   Fish Oil-Vitamin D 1000-1000 MG-UNIT CAPS Take by mouth once. daily   furosemide (LASIX) 20 MG tablet Take 20 mg by mouth as needed.   gabapentin (NEURONTIN) 300 MG capsule Take 1 capsule (300 mg total) by mouth 3 (three) times daily.   Glucagon (GVOKE HYPOPEN 2-PACK) 1 MG/0.2ML SOAJ Inject 1 mg into the skin daily as needed.   insulin aspart (NOVOLOG FLEXPEN) 100 UNIT/ML FlexPen Inject 8-14 Units into the skin 3 (three) times daily with meals.   isosorbide mononitrate (IMDUR) 60 MG 24 hr tablet Take 1 tablet (60 mg total) by mouth daily.   levothyroxine (SYNTHROID) 88 MCG tablet Take 1 tablet (88 mcg total) by mouth daily before breakfast.   lisinopril (ZESTRIL) 20 MG tablet Take 1 tablet (20 mg total) by mouth daily.   metoprolol succinate (TOPROL-XL) 50 MG 24 hr tablet TAKE 1 TABLET EVERY DAY   tamsulosin (FLOMAX) 0.4 MG CAPS capsule TAKE 1 CAPSULE AT BEDTIME   [DISCONTINUED] insulin aspart (NOVOLOG) 100 UNIT/ML injection Inject 8-14 Units into the skin 3 (three) times daily with meals.   [DISCONTINUED] insulin degludec (TRESIBA FLEXTOUCH) 100 UNIT/ML FlexTouch Pen Inject 16 Units into the skin at bedtime.   insulin degludec (TRESIBA FLEXTOUCH) 100 UNIT/ML FlexTouch Pen Inject 16 Units into the skin at bedtime.   No facility-administered encounter medications on file as of 04/20/2022.    ALLERGIES: Allergies  Allergen Reactions   Plavix [Clopidogrel] Hives    VACCINATION STATUS: Immunization History  Administered Date(s) Administered   Fluad Quad(high Dose 65+) 09/22/2019, 11/03/2020, 11/10/2021   Influenza Whole 10/05/2005, 12/17/2006   Influenza,inj,quad, With Preservative 11/25/2018   Moderna  Sars-Covid-2 Vaccination 03/06/2019, 04/03/2019, 01/06/2020   Pneumococcal Conjugate-13 01/29/2020   Pneumococcal Polysaccharide-23 08/21/2005   Td 12/17/2006   Tdap 06/10/2021   Zoster Recombinat (Shingrix) 06/03/2021, 09/20/2021    Diabetes He presents for his follow-up diabetic visit. He has type 2 diabetes mellitus. Onset time: Diagnosed at approx age of 24. His disease course has been worsening. There are no hypoglycemic  associated symptoms. Associated symptoms include foot paresthesias. There are no hypoglycemic complications. Symptoms are stable. Diabetic complications include heart disease, nephropathy, peripheral neuropathy and PVD. Risk factors for coronary artery disease include diabetes mellitus, dyslipidemia, family history, male sex and hypertension. Current diabetic treatment includes intensive insulin program. He is compliant with treatment most of the time. His weight is fluctuating minimally. He is following a generally healthy diet. Meal planning includes avoidance of concentrated sweets. He has had a previous visit with a dietitian. He participates in exercise intermittently. His home blood glucose trend is increasing steadily. His overall blood glucose range is >200 mg/dl. (He presents today, accompanied by his wife, with his CGM showing gross hyperglycemia overall.  His POCT A1c today is 10.1%, increasing from last visit of 8.3%.  He admits he has not been sticking with his diet recently.  Analysis of his CGM shows TIR 32%, TAR 68%, TBR 0% with a GMI of 9%.) An ACE inhibitor/angiotensin II receptor blocker is being taken. He sees a podiatrist.Eye exam is current.     Review of systems  Constitutional: + Minimally fluctuating body weight, current Body mass index is 27.31 kg/m., no fatigue, no subjective hyperthermia, no subjective hypothermia Eyes: no blurry vision, no xerophthalmia ENT: no sore throat, no nodules palpated in throat, no dysphagia/odynophagia, no  hoarseness Cardiovascular: no chest pain, no shortness of breath, no palpitations, no leg swelling Respiratory: no cough, no shortness of breath Gastrointestinal: no nausea/vomiting/diarrhea Musculoskeletal: no muscle/joint aches, walks with cane Skin: no rashes, no hyperemia Neurological: no tremors, + numbness/tingling to BLE, no dizziness Psychiatric: no depression, no anxiety   Objective:     BP (!) 144/78 (BP Location: Left Arm, Patient Position: Sitting, Cuff Size: Large) Comment: Retake with manuel cuff  Pulse 64   Ht 6\' 6"  (1.981 m)   Wt 236 lb 4.8 oz (107.2 kg)   BMI 27.31 kg/m   Wt Readings from Last 3 Encounters:  04/20/22 236 lb 4.8 oz (107.2 kg)  04/18/22 234 lb 12.8 oz (106.5 kg)  03/27/22 238 lb (108 kg)     BP Readings from Last 3 Encounters:  04/20/22 (!) 144/78  04/18/22 132/80  03/27/22 (!) 158/92     Physical Exam- Limited  Constitutional:  Body mass index is 27.31 kg/m. , not in acute distress, normal state of mind Eyes:  EOMI, no exophthalmos Musculoskeletal: no gross deformities, strength intact in all four extremities, no gross restriction of joint movements, walks with cane Skin:  no rashes, no hyperemia Neurological: no tremor with outstretched hands    CMP ( most recent) CMP     Component Value Date/Time   NA 141 02/13/2022 1352   K 4.9 02/13/2022 1352   CL 106 02/13/2022 1352   CO2 21 02/13/2022 1352   GLUCOSE 155 (H) 02/13/2022 1352   GLUCOSE 295 (H) 05/12/2021 2120   BUN 19 02/13/2022 1352   CREATININE 1.62 (H) 02/13/2022 1352   CREATININE 1.34 (H) 02/02/2021 0839   CALCIUM 9.7 02/13/2022 1352   PROT 6.3 02/13/2022 1352   ALBUMIN 3.9 02/13/2022 1352   AST 26 02/13/2022 1352   ALT 41 02/13/2022 1352   ALKPHOS 131 (H) 02/13/2022 1352   BILITOT 0.6 02/13/2022 1352   GFRNONAA 44 (L) 05/12/2021 2120   GFRNONAA 54 (L) 04/29/2020 1141   GFRAA 63 04/29/2020 1141     Diabetic Labs (most recent): Lab Results  Component Value  Date   HGBA1C 10.1 (A) 04/20/2022   HGBA1C 8.3 (A) 01/18/2022  HGBA1C 9.7 (A) 09/29/2021   MICROALBUR 5.0 02/02/2021   MICROALBUR 1.4 09/22/2019   MICROALBUR 0.67 12/17/2006     Lipid Panel ( most recent) Lipid Panel     Component Value Date/Time   CHOL 101 06/14/2021 0916   TRIG 53 06/14/2021 0916   HDL 33 (L) 06/14/2021 0916   CHOLHDL 3.1 06/14/2021 0916   CHOLHDL 3.2 11/03/2020 1032   VLDL 12 06/17/2008 0304   LDLCALC 55 06/14/2021 0916   LDLCALC 73 11/03/2020 1032   LABVLDL 13 06/14/2021 0916      Lab Results  Component Value Date   TSH 2.140 12/23/2021   TSH 1.790 06/14/2021   TSH 1.45 11/03/2020   TSH 1.75 03/31/2020   TSH 1.17 09/22/2019   TSH 0.94 03/19/2019   TSH 1.039 09/20/2017   TSH 0.26 (A) 03/27/2014   TSH 1.746 06/17/2008   TSH 1.028 12/18/2007   FREET4 1.41 12/23/2021   FREET4 1.3 09/22/2019   FREET4 1.3 03/19/2019   FREET4 1.32 07/31/2011   FREET4 1.09 06/17/2008   FREET4 1.31 12/18/2007   FREET4 1.27 05/10/2006           Assessment & Plan:   1) Type 2 diabetes mellitus with stage 3b chronic kidney disease, with long-term current use of insulin (HCC)  He presents today, accompanied by his wife, with his CGM showing gross hyperglycemia overall.  His POCT A1c today is 10.1%, increasing from last visit of 8.3%.  He admits he has not been sticking with his diet recently.  Analysis of his CGM shows TIR 32%, TAR 68%, TBR 0% with a GMI of 9%.  - Dylan Robles has currently uncontrolled symptomatic type 2 DM since 82 years of age.   -Recent labs reviewed.  - I had a long discussion with him about the progressive nature of diabetes and the pathology behind its complications. -his diabetes is complicated by CAD, CKD, PVD, neuropathy and he remains at a high risk for more acute and chronic complications which include CAD, CVA, CKD, retinopathy, and neuropathy. These are all discussed in detail with him.  The following Lifestyle Medicine  recommendations according to American College of Lifestyle Medicine [facility]) were discussed and offered to patient and he agrees to start the journey:  A. Whole Foods, Plant-based plate comprising of fruits and vegetables, plant-based proteins, whole-grain carbohydrates was discussed in detail with the patient.   A list for source of those nutrients were also provided to the patient.  Patient will use only water or unsweetened tea for hydration. B.  The need to stay away from risky substances including alcohol, smoking; obtaining 7 to 9 hours of restorative sleep, at least 150 minutes of moderate intensity exercise weekly, the importance of healthy social connections,  and stress reduction techniques were discussed. C.  A full color page of  Calorie density of various food groups per pound showing examples of each food groups was provided to the patient.  - Nutritional counseling repeated at each appointment due to patients tendency to fall back in to old habits.  - The patient admits there is a room for improvement in their diet and drink choices. -  Suggestion is made for the patient to avoid simple carbohydrates from their diet including Cakes, Sweet Desserts / Pastries, Ice Cream, Soda (diet and regular), Sweet Tea, Candies, Chips, Cookies, Sweet Pastries, Store Bought Juices, Alcohol in Excess of 1-2 drinks a day, Artificial Sweeteners, Coffee Creamer, and "Sugar-free" Products. This will help patient to have  stable blood glucose profile and potentially avoid unintended weight gain.   - I encouraged the patient to switch to unprocessed or minimally processed complex starch and increased protein intake (animal or plant source), fruits, and vegetables.   - Patient is advised to stick to a routine mealtimes to eat 3 meals a day and avoid unnecessary snacks (to snack only to correct hypoglycemia).  - I have approached him with the following individualized plan to manage his diabetes and patient agrees:    -He is advised to continue his Tresiba 16 units SQ nightly and increase Novolog to 8-14 units TID with meals if glucose is above 90 and he is eating (Specific instructions on how to titrate insulin dosage based on glucose readings given to patient in writing).    -he is encouraged to continue monitoring glucose 4 times daily (using his CGM), before meals and before bed, and to call the clinic if he has readings less than 70 or above 300 for 3 tests in a row.  - he is warned not to take insulin without proper monitoring per orders. - Adjustment parameters are given to him for hypo and hyperglycemia in writing.  - his Glipizide was be discontinued, risk outweighs benefit for this patient- risk of hypoglycemia given advanced age and CKD. - he is not a candidate for full dose Metformin due to concurrent renal insufficiency, I discontinued this at last visit to de-escalate his treatment and preserve kidney function.  - he is not an ideal candidate for incretin therapy due to body habitus with BMI at 25.  - Specific targets for  A1c; LDL, HDL, and Triglycerides were discussed with the patient.  2) Blood Pressure /Hypertension:  his blood pressure is controlled to target for his age.   he is advised to continue his current medications including Norvasc 5 mg p.o. daily with breakfast, Lasix 20 mg po daily as needed, Lisinopril 20 mg po daily and Metoprolol 50 mg po daily.  3) Lipids/Hyperlipidemia:    Review of his recent lipid panel from 06/14/21 showed controlled LDL at 55.  he is advised to continue Lipitor 80 mg daily at bedtime and Zetia 10 mg po daily.  Side effects and precautions discussed with him.  Will recheck lipid panel prior to next visit.  4)  Weight/Diet:  his Body mass index is 27.31 kg/m.  -  he is NOT a candidate for weight loss.  Exercise, and detailed carbohydrates information provided  -  detailed on discharge instructions.  5) Hypothyroidism-unspecified The details  surrounding his diagnosis are not available.    There are no recent TFTs to review.  He is advised to continue his Levothyroxine 88 mcg po daily before breakfast.  Will recheck prior to next visit.  - The correct intake of thyroid hormone (Levothyroxine, Synthroid), is on empty stomach first thing in the morning, with water, separated by at least 30 minutes from breakfast and other medications,  and separated by more than 4 hours from calcium, iron, multivitamins, acid reflux medications (PPIs).  - This medication is a life-long medication and will be needed to correct thyroid hormone imbalances for the rest of your life.  The dose may change from time to time, based on thyroid blood work.  - It is extremely important to be consistent taking this medication, near the same time each morning.  -AVOID TAKING PRODUCTS CONTAINING BIOTIN (commonly found in Hair, Skin, Nails vitamins) AS IT INTERFERES WITH THE VALIDITY OF THYROID FUNCTION BLOOD TESTS.  6) Chronic Care/Health Maintenance: -he is on ACEI/ARB and Statin medications and is encouraged to initiate and continue to follow up with Ophthalmology, Dentist, Podiatrist at least yearly or according to recommendations, and advised to stay away from smoking. I have recommended yearly flu vaccine and pneumonia vaccine at least every 5 years; moderate intensity exercise for up to 150 minutes weekly; and sleep for at least 7 hours a day.  - he is advised to maintain close follow up with Durwin Nora, Lucina Mellow, MD for primary care needs, as well as his other providers for optimal and coordinated care.     I spent  47  minutes in the care of the patient today including review of labs from CMP, Lipids, Thyroid Function, Hematology (current and previous including abstractions from other facilities); face-to-face time discussing  his blood glucose readings/logs, discussing hypoglycemia and hyperglycemia episodes and symptoms, medications doses, his options of short  and long term treatment based on the latest standards of care / guidelines;  discussion about incorporating lifestyle medicine;  and documenting the encounter. Risk reduction counseling performed per USPSTF guidelines to reduce obesity and cardiovascular risk factors.     Please refer to Patient Instructions for Blood Glucose Monitoring and Insulin/Medications Dosing Guide"  in media tab for additional information. Please  also refer to " Patient Self Inventory" in the Media  tab for reviewed elements of pertinent patient history.  Dylan Robles participated in the discussions, expressed understanding, and voiced agreement with the above plans.  All questions were answered to his satisfaction. he is encouraged to contact clinic should he have any questions or concerns prior to his return visit.     Follow up plan: - Return in about 4 weeks (around 05/18/2022) for Diabetes F/U, Bring meter and logs.  Ronny Bacon, [facility] [address] [place], [place] [address] Phone: [phone] Fax: [phone]  04/20/2022, 12:09 PM

## 2022-04-23 ENCOUNTER — Other Ambulatory Visit: Payer: Self-pay | Admitting: Nurse Practitioner

## 2022-04-23 DIAGNOSIS — E1165 Type 2 diabetes mellitus with hyperglycemia: Secondary | ICD-10-CM | POA: Diagnosis not present

## 2022-04-27 ENCOUNTER — Ambulatory Visit (INDEPENDENT_AMBULATORY_CARE_PROVIDER_SITE_OTHER): Payer: Medicare HMO

## 2022-04-27 DIAGNOSIS — I340 Nonrheumatic mitral (valve) insufficiency: Secondary | ICD-10-CM | POA: Diagnosis not present

## 2022-04-27 DIAGNOSIS — R0602 Shortness of breath: Secondary | ICD-10-CM

## 2022-04-27 DIAGNOSIS — I351 Nonrheumatic aortic (valve) insufficiency: Secondary | ICD-10-CM | POA: Diagnosis not present

## 2022-05-03 ENCOUNTER — Other Ambulatory Visit (HOSPITAL_COMMUNITY): Payer: Self-pay | Admitting: Nephrology

## 2022-05-03 DIAGNOSIS — E1129 Type 2 diabetes mellitus with other diabetic kidney complication: Secondary | ICD-10-CM

## 2022-05-03 DIAGNOSIS — E875 Hyperkalemia: Secondary | ICD-10-CM

## 2022-05-03 DIAGNOSIS — I129 Hypertensive chronic kidney disease with stage 1 through stage 4 chronic kidney disease, or unspecified chronic kidney disease: Secondary | ICD-10-CM

## 2022-05-03 DIAGNOSIS — E1122 Type 2 diabetes mellitus with diabetic chronic kidney disease: Secondary | ICD-10-CM

## 2022-05-03 DIAGNOSIS — D638 Anemia in other chronic diseases classified elsewhere: Secondary | ICD-10-CM

## 2022-05-06 ENCOUNTER — Other Ambulatory Visit: Payer: Self-pay | Admitting: Nurse Practitioner

## 2022-05-06 DIAGNOSIS — E039 Hypothyroidism, unspecified: Secondary | ICD-10-CM

## 2022-05-09 ENCOUNTER — Encounter: Payer: Self-pay | Admitting: Cardiovascular Disease

## 2022-05-09 ENCOUNTER — Ambulatory Visit: Payer: Medicare HMO | Admitting: Cardiovascular Disease

## 2022-05-09 VITALS — BP 140/80 | HR 88 | Ht 78.0 in | Wt 240.0 lb

## 2022-05-09 DIAGNOSIS — I10 Essential (primary) hypertension: Secondary | ICD-10-CM | POA: Diagnosis not present

## 2022-05-09 DIAGNOSIS — E782 Mixed hyperlipidemia: Secondary | ICD-10-CM

## 2022-05-09 DIAGNOSIS — R0602 Shortness of breath: Secondary | ICD-10-CM

## 2022-05-09 MED ORDER — SPIRONOLACTONE 25 MG PO TABS
25.0000 mg | ORAL_TABLET | Freq: Every day | ORAL | 11 refills | Status: DC
Start: 2022-05-09 — End: 2023-05-21
  Filled 2023-01-23: qty 30, 30d supply, fill #0
  Filled 2023-02-23: qty 30, 30d supply, fill #1
  Filled 2023-03-24: qty 30, 30d supply, fill #2
  Filled 2023-04-23: qty 30, 30d supply, fill #3

## 2022-05-09 NOTE — Progress Notes (Signed)
Cardiology Office Note   Date:  05/09/2022   ID:  Dylan Robles, DOB [birth year], MRN [id number]  PCP:  Billie Lade, MD  Cardiologist:  Adrian Blackwater, MD      History of Present Illness: Dylan Robles is a 82 y.o. male who presents for  Chief Complaint  Patient presents with   Follow-up    Echo results    Patient in office to discuss echo results. Denies chest pain. Reports shortness of breath with long walking and bending over.      Past Medical History:  Diagnosis Date   Acute lower UTI 09/20/2017   Bradycardia on ECG 07/31/2011   Coronary artery disease    hyperlipidemia   Decreased GFR 09/23/2019   Dehydration 09/21/2017   Diabetes mellitus    Diabetes mellitus without complication (HCC)    Hypercholesterolemia    Hypertension    Hypothyroidism    Left arm pain 01/29/2020   Paronychia of great toe of left foot 09/26/2018   Paronychia of great toe, left 09/26/2018   PERIPHERAL NEUROPATHY 01/13/2006   Qualifier: Diagnosis of  By: Jen Mow MD, Christine     Shortness of breath dyspnea    with exertion   Vasovagal syncope    Weakness 09/20/2017     Past Surgical History:  Procedure Laterality Date   APPENDECTOMY     CARDIAC CATHETERIZATION N/A 06/16/2014   Procedure: Left Heart Cath;  Surgeon: Laurier Nancy, MD;  Location: [facility];  Service: Cardiovascular;  Laterality: N/A;   CIRCUMCISION, NON-NEWBORN     HERNIA REPAIR     LOWER EXTREMITY ANGIOGRAPHY Left 12/24/2018   Procedure: LOWER EXTREMITY ANGIOGRAPHY;  Surgeon: Renford Dills, MD;  Location: [facility];  Service: Cardiovascular;  Laterality: Left;   THYROID SURGERY       Current Outpatient Medications  Medication Sig Dispense Refill   amLODipine (NORVASC) 5 MG tablet TAKE 1 TABLET EVERY DAY 90 tablet 3   aspirin EC 81 MG tablet Take 81 mg by mouth daily.      atorvastatin (LIPITOR) 80 MG tablet TAKE 1 TABLET AT BEDTIME. 90 tablet 1   BRILINTA 60 MG TABS tablet TAKE 1 TABLET  TWICE DAILY 180 tablet 3   cetirizine (ZYRTEC) 5 MG tablet Take 1 tablet (5 mg total) by mouth daily. 90 tablet 1   Continuous Blood Gluc Sensor (FREESTYLE LIBRE 2 SENSOR) MISC Use to check glucose at least 4 times daily 6 each 3   ezetimibe (ZETIA) 10 MG tablet Take 10 mg by mouth daily.     Fish Oil-Vitamin D 1000-1000 MG-UNIT CAPS Take by mouth once. daily     gabapentin (NEURONTIN) 300 MG capsule Take 1 capsule (300 mg total) by mouth 3 (three) times daily. 270 capsule 1   Glucagon (GVOKE HYPOPEN 2-PACK) 1 MG/0.2ML SOAJ Inject 1 mg into the skin daily as needed. 0.4 mL 1   insulin aspart (NOVOLOG FLEXPEN) 100 UNIT/ML FlexPen Inject 8-14 Units into the skin 3 (three) times daily with meals. 30 mL 3   insulin degludec (TRESIBA FLEXTOUCH) 100 UNIT/ML FlexTouch Pen Inject 16 Units into the skin at bedtime. 15 mL 3   isosorbide mononitrate (IMDUR) 60 MG 24 hr tablet Take 1 tablet (60 mg total) by mouth daily. 30 tablet 2   levothyroxine (SYNTHROID) 88 MCG tablet TAKE 1 TABLET EVERY DAY BEFORE BREAKFAST 90 tablet 3   lisinopril (ZESTRIL) 20 MG tablet Take 1 tablet (20 mg total)  by mouth daily. 90 tablet 3   metoprolol succinate (TOPROL-XL) 50 MG 24 hr tablet TAKE 1 TABLET EVERY DAY 90 tablet 0   spironolactone (ALDACTONE) 25 MG tablet Take 1 tablet (25 mg total) by mouth daily. 30 tablet 11   tamsulosin (FLOMAX) 0.4 MG CAPS capsule TAKE 1 CAPSULE AT BEDTIME 90 capsule 1   furosemide (LASIX) 20 MG tablet Take 20 mg by mouth as needed.     No current facility-administered medications for this visit.    Allergies:   Plavix [clopidogrel]    Social History:   reports that he has quit smoking. He has never used smokeless tobacco. He reports that he does not drink alcohol and does not use drugs.   Family History:  family history includes Cancer in his mother; Diabetes in his brother and father; Hypertension in his father.    ROS:     Review of Systems  Constitutional: Negative.   HENT:  Negative.    Eyes: Negative.   Respiratory:  Positive for shortness of breath.   Cardiovascular: Negative.   Gastrointestinal: Negative.   Genitourinary: Negative.   Musculoskeletal: Negative.   Skin: Negative.   Neurological: Negative.   Endo/Heme/Allergies: Negative.   Psychiatric/Behavioral: Negative.    All other systems reviewed and are negative.   All other systems are reviewed and negative.   PHYSICAL EXAM: VS:  BP (!) 140/80   Pulse 88   Ht 6\' 6"  (1.981 m)   Wt 240 lb (108.9 kg)   PF 98 L/min   BMI 27.73 kg/m  , BMI Body mass index is 27.73 kg/m. Last weight:  Wt Readings from Last 3 Encounters:  05/09/22 240 lb (108.9 kg)  04/20/22 236 lb 4.8 oz (107.2 kg)  04/18/22 234 lb 12.8 oz (106.5 kg)   Physical Exam Vitals reviewed.  Constitutional:      Appearance: Normal appearance. He is normal weight.  HENT:     Head: Normocephalic.     Nose: Nose normal.     Mouth/Throat:     Mouth: Mucous membranes are moist.  Eyes:     Pupils: Pupils are equal, round, and reactive to light.  Cardiovascular:     Rate and Rhythm: Normal rate and regular rhythm.     Pulses: Normal pulses.     Heart sounds: Normal heart sounds.  Pulmonary:     Effort: Pulmonary effort is normal.  Abdominal:     General: Abdomen is flat. Bowel sounds are normal.  Musculoskeletal:        General: Normal range of motion.     Cervical back: Normal range of motion.  Skin:    General: Skin is warm.  Neurological:     General: No focal deficit present.     Mental Status: He is alert.  Psychiatric:        Mood and Affect: Mood normal.      EKG: none yoday  Recent Labs: 11/10/2021: Hemoglobin 11.8; Platelets 258 12/23/2021: TSH 2.140 02/13/2022: ALT 41; BUN 19; Creatinine, Ser 1.62; Potassium 4.9; Sodium 141    Lipid Panel    Component Value Date/Time   CHOL 101 06/14/2021 0916   TRIG 53 06/14/2021 0916   HDL 33 (L) 06/14/2021 0916   CHOLHDL 3.1 06/14/2021 0916   CHOLHDL 3.2  11/03/2020 1032   VLDL 12 06/17/2008 0304   LDLCALC 55 06/14/2021 0916   LDLCALC 73 11/03/2020 1032      Other studies Reviewed: echo 04/27/22   ASSESSMENT AND PLAN:  ICD-10-CM   1. SOB (shortness of breath)  R06.02 spironolactone (ALDACTONE) 25 MG tablet    2. Essential hypertension  I10     3. Mixed hyperlipidemia  E78.2        Problem List Items Addressed This Visit       Cardiovascular and Mediastinum   Essential hypertension   Relevant Medications   spironolactone (ALDACTONE) 25 MG tablet     Other   Hyperlipidemia   Relevant Medications   spironolactone (ALDACTONE) 25 MG tablet   SOB (shortness of breath) - Primary    Patient complaining of shortness of breath with exertion, bending over. EF revealed normal EF. Taking Lasix as needed. Add spironolactone. Continue Lasix as needed.       Relevant Medications   spironolactone (ALDACTONE) 25 MG tablet     Disposition:   Return in about 4 weeks (around 06/06/2022).    Total time spent: 30 minutes  Signed,  Adrian Blackwater, MD  05/09/2022 11:04 AM    [facility]

## 2022-05-09 NOTE — Assessment & Plan Note (Signed)
Patient complaining of shortness of breath with exertion, bending over. EF revealed normal EF. Taking Lasix as needed. Add spironolactone. Continue Lasix as needed.

## 2022-05-10 DIAGNOSIS — E039 Hypothyroidism, unspecified: Secondary | ICD-10-CM | POA: Diagnosis not present

## 2022-05-10 DIAGNOSIS — E1122 Type 2 diabetes mellitus with diabetic chronic kidney disease: Secondary | ICD-10-CM | POA: Diagnosis not present

## 2022-05-10 DIAGNOSIS — Z794 Long term (current) use of insulin: Secondary | ICD-10-CM | POA: Diagnosis not present

## 2022-05-10 DIAGNOSIS — N1832 Chronic kidney disease, stage 3b: Secondary | ICD-10-CM | POA: Diagnosis not present

## 2022-05-11 DIAGNOSIS — E1122 Type 2 diabetes mellitus with diabetic chronic kidney disease: Secondary | ICD-10-CM | POA: Diagnosis not present

## 2022-05-11 DIAGNOSIS — D638 Anemia in other chronic diseases classified elsewhere: Secondary | ICD-10-CM | POA: Diagnosis not present

## 2022-05-11 DIAGNOSIS — N189 Chronic kidney disease, unspecified: Secondary | ICD-10-CM | POA: Diagnosis not present

## 2022-05-11 LAB — COMPREHENSIVE METABOLIC PANEL
ALT: 22 IU/L (ref 0–44)
AST: 21 IU/L (ref 0–40)
Albumin/Globulin Ratio: 1.9 (ref 1.2–2.2)
Albumin: 3.9 g/dL (ref 3.7–4.7)
Alkaline Phosphatase: 147 IU/L — ABNORMAL HIGH (ref 44–121)
BUN/Creatinine Ratio: 10 (ref 10–24)
BUN: 14 mg/dL (ref 8–27)
Bilirubin Total: 1.1 mg/dL (ref 0.0–1.2)
CO2: 20 mmol/L (ref 20–29)
Calcium: 9.6 mg/dL (ref 8.6–10.2)
Chloride: 104 mmol/L (ref 96–106)
Creatinine, Ser: 1.42 mg/dL — ABNORMAL HIGH (ref 0.76–1.27)
Globulin, Total: 2.1 g/dL (ref 1.5–4.5)
Glucose: 212 mg/dL — ABNORMAL HIGH (ref 70–99)
Potassium: 4.8 mmol/L (ref 3.5–5.2)
Sodium: 139 mmol/L (ref 134–144)
Total Protein: 6 g/dL (ref 6.0–8.5)
eGFR: 49 mL/min/{1.73_m2} — ABNORMAL LOW (ref >59)

## 2022-05-11 LAB — LIPID PANEL
Chol/HDL Ratio: 2.7 ratio (ref 0.0–5.0)
Cholesterol, Total: 125 mg/dL (ref 100–199)
HDL: 46 mg/dL (ref >39)
LDL Chol Calc (NIH): 65 mg/dL (ref 0–99)
Triglycerides: 68 mg/dL (ref 0–149)
VLDL Cholesterol Cal: 14 mg/dL (ref 5–40)

## 2022-05-11 LAB — T4, FREE: Free T4: 1.45 ng/dL (ref 0.82–1.77)

## 2022-05-11 LAB — VITAMIN D 25 HYDROXY (VIT D DEFICIENCY, FRACTURES): Vit D, 25-Hydroxy: 28.8 ng/mL — ABNORMAL LOW (ref 30.0–100.0)

## 2022-05-11 LAB — TSH: TSH: 1.73 u[IU]/mL (ref 0.450–4.500)

## 2022-05-17 ENCOUNTER — Ambulatory Visit (HOSPITAL_COMMUNITY)
Admission: RE | Admit: 2022-05-17 | Discharge: 2022-05-17 | Disposition: A | Discharge status: Home / Self Care | Payer: Medicare HMO | Source: Ambulatory Visit | Attending: Nephrology | Admitting: Nephrology

## 2022-05-17 DIAGNOSIS — E875 Hyperkalemia: Secondary | ICD-10-CM | POA: Diagnosis not present

## 2022-05-17 DIAGNOSIS — R809 Proteinuria, unspecified: Secondary | ICD-10-CM | POA: Diagnosis not present

## 2022-05-17 DIAGNOSIS — D638 Anemia in other chronic diseases classified elsewhere: Secondary | ICD-10-CM | POA: Diagnosis not present

## 2022-05-17 DIAGNOSIS — E1129 Type 2 diabetes mellitus with other diabetic kidney complication: Secondary | ICD-10-CM | POA: Insufficient documentation

## 2022-05-17 DIAGNOSIS — I129 Hypertensive chronic kidney disease with stage 1 through stage 4 chronic kidney disease, or unspecified chronic kidney disease: Secondary | ICD-10-CM | POA: Insufficient documentation

## 2022-05-17 DIAGNOSIS — E1122 Type 2 diabetes mellitus with diabetic chronic kidney disease: Secondary | ICD-10-CM | POA: Diagnosis not present

## 2022-05-17 DIAGNOSIS — N189 Chronic kidney disease, unspecified: Secondary | ICD-10-CM | POA: Diagnosis not present

## 2022-05-17 NOTE — Patient Instructions (Signed)

## 2022-05-19 ENCOUNTER — Ambulatory Visit: Payer: Medicare HMO | Admitting: Nurse Practitioner

## 2022-05-19 ENCOUNTER — Encounter: Payer: Self-pay | Admitting: Nurse Practitioner

## 2022-05-19 VITALS — BP 136/70 | HR 66 | Ht 78.0 in | Wt 237.2 lb

## 2022-05-19 DIAGNOSIS — Z794 Long term (current) use of insulin: Secondary | ICD-10-CM | POA: Diagnosis not present

## 2022-05-19 DIAGNOSIS — N1831 Chronic kidney disease, stage 3a: Secondary | ICD-10-CM

## 2022-05-19 DIAGNOSIS — E039 Hypothyroidism, unspecified: Secondary | ICD-10-CM | POA: Diagnosis not present

## 2022-05-19 DIAGNOSIS — E1122 Type 2 diabetes mellitus with diabetic chronic kidney disease: Secondary | ICD-10-CM

## 2022-05-19 MED ORDER — NOVOLOG FLEXPEN 100 UNIT/ML ~~LOC~~ SOPN: 10.0000 [IU] | PEN_INJECTOR | Freq: Three times a day (TID) | SUBCUTANEOUS | 3 refills | Status: DC

## 2022-05-19 NOTE — Progress Notes (Signed)
Endocrinology Follow Up Note       05/19/2022, 11:11 AM   Subjective:    Patient ID: Dylan Robles, male    DOB: 07-Jan-1941.  Dylan Robles is being seen in follow up after being seen in consultation for management of currently uncontrolled symptomatic diabetes requested by  Billie Lade, MD.   Past Medical History:  Diagnosis Date   Acute lower UTI 09/20/2017   Bradycardia on ECG 07/31/2011   Coronary artery disease    hyperlipidemia   Decreased GFR 09/23/2019   Dehydration 09/21/2017   Diabetes mellitus    Diabetes mellitus without complication (HCC)    Hypercholesterolemia    Hypertension    Hypothyroidism    Left arm pain 01/29/2020   Paronychia of great toe of left foot 09/26/2018   Paronychia of great toe, left 09/26/2018   PERIPHERAL NEUROPATHY 01/13/2006   Qualifier: Diagnosis of  By: Jen Mow MD, Christine     Shortness of breath dyspnea    with exertion   Vasovagal syncope    Weakness 09/20/2017    Past Surgical History:  Procedure Laterality Date   APPENDECTOMY     CARDIAC CATHETERIZATION N/A 06/16/2014   Procedure: Left Heart Cath;  Surgeon: Laurier Nancy, MD;  Location: [facility];  Service: Cardiovascular;  Laterality: N/A;   CIRCUMCISION, NON-NEWBORN     HERNIA REPAIR     LOWER EXTREMITY ANGIOGRAPHY Left 12/24/2018   Procedure: LOWER EXTREMITY ANGIOGRAPHY;  Surgeon: Renford Dills, MD;  Location: [facility];  Service: Cardiovascular;  Laterality: Left;   THYROID SURGERY      Social History   Socioeconomic History   Marital status: Married    Spouse name: Not on file   Number of children: Not on file   Years of education: Not on file   Highest education level: Not on file  Occupational History   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  Tobacco Use   Smoking status: Former   Smokeless tobacco: Never  Building services engineer Use: Never used  Substance and  Sexual Activity   Alcohol use: No   Drug use: No   Sexual activity: Not on file  Other Topics Concern   Not on file  Social History Narrative   ** Merged History Encounter **       Social Determinants of Health   Financial Resource Strain: Low Risk  (03/29/2021)   Overall Financial Resource Strain (CARDIA)    Difficulty of Paying Living Expenses: Not very hard  Food Insecurity: No Food Insecurity (03/29/2021)   Hunger Vital Sign    Worried About Running Out of Food in the Last Year: Never true    Ran Out of Food in the Last Year: Never true  Transportation Needs: No Transportation Needs (03/29/2021)   PRAPARE - Administrator, Civil Service (Medical): No    Lack of Transportation (Non-Medical): No  Physical Activity: Insufficiently Active (03/29/2021)   Exercise Vital Sign    Days of Exercise per Week: 7 days    Minutes of Exercise per Session: 20 min  Stress: No Stress Concern Present (03/29/2021)   [place]  [employer] - Occupational Stress Questionnaire    Feeling of Stress : Not at all  Social Connections: Socially Integrated (03/29/2021)   Social Connection and Isolation Panel [NHANES]    Frequency of Communication with Friends and Family: More than three times a week    Frequency of Social Gatherings with Friends and Family: More than three times a week    Attends Religious Services: More than 4 times per year    Active Member of [employer] or Organizations: Yes    Attends Banker Meetings: 1 to 4 times per year    Marital Status: Married    Family History  Problem Relation Age of Onset   Diabetes Father    Hypertension Father    Cancer Mother    Diabetes Brother     Outpatient Encounter Medications as of 05/19/2022  Medication Sig   amLODipine (NORVASC) 5 MG tablet TAKE 1 TABLET EVERY DAY   aspirin EC 81 MG tablet Take 81 mg by mouth daily.    atorvastatin (LIPITOR) 80 MG tablet TAKE 1 TABLET AT BEDTIME.   BRILINTA 60 MG TABS  tablet TAKE 1 TABLET TWICE DAILY   cetirizine (ZYRTEC) 5 MG tablet Take 1 tablet (5 mg total) by mouth daily.   Continuous Blood Gluc Sensor (FREESTYLE LIBRE 2 SENSOR) MISC Use to check glucose at least 4 times daily   ezetimibe (ZETIA) 10 MG tablet Take 10 mg by mouth daily.   Fish Oil-Vitamin D 1000-1000 MG-UNIT CAPS Take by mouth once. daily   furosemide (LASIX) 20 MG tablet Take 20 mg by mouth as needed.   gabapentin (NEURONTIN) 300 MG capsule Take 1 capsule (300 mg total) by mouth 3 (three) times daily.   Glucagon (GVOKE HYPOPEN 2-PACK) 1 MG/0.2ML SOAJ Inject 1 mg into the skin daily as needed.   insulin degludec (TRESIBA FLEXTOUCH) 100 UNIT/ML FlexTouch Pen Inject 16 Units into the skin at bedtime.   levothyroxine (SYNTHROID) 88 MCG tablet TAKE 1 TABLET EVERY DAY BEFORE BREAKFAST   lisinopril (ZESTRIL) 20 MG tablet Take 1 tablet (20 mg total) by mouth daily.   metoprolol succinate (TOPROL-XL) 50 MG 24 hr tablet TAKE 1 TABLET EVERY DAY   spironolactone (ALDACTONE) 25 MG tablet Take 1 tablet (25 mg total) by mouth daily.   tamsulosin (FLOMAX) 0.4 MG CAPS capsule TAKE 1 CAPSULE AT BEDTIME   [DISCONTINUED] insulin aspart (NOVOLOG FLEXPEN) 100 UNIT/ML FlexPen Inject 8-14 Units into the skin 3 (three) times daily with meals.   insulin aspart (NOVOLOG FLEXPEN) 100 UNIT/ML FlexPen Inject 10-16 Units into the skin 3 (three) times daily with meals.   isosorbide mononitrate (IMDUR) 60 MG 24 hr tablet Take 1 tablet (60 mg total) by mouth daily.   No facility-administered encounter medications on file as of 05/19/2022.    ALLERGIES: Allergies  Allergen Reactions   Plavix [Clopidogrel] Hives    VACCINATION STATUS: Immunization History  Administered Date(s) Administered   Fluad Quad(high Dose 65+) 09/22/2019, 11/03/2020, 11/10/2021   Influenza Whole 10/05/2005, 12/17/2006   Influenza,inj,quad, With Preservative 11/25/2018   Moderna Sars-Covid-2 Vaccination 03/06/2019, 04/03/2019, 01/06/2020    Pneumococcal Conjugate-13 01/29/2020   Pneumococcal Polysaccharide-23 08/21/2005   Td 12/17/2006   Tdap 06/10/2021   Zoster Recombinat (Shingrix) 06/03/2021, 09/20/2021    Diabetes He presents for his follow-up diabetic visit. He has type 2 diabetes mellitus. Onset time: Diagnosed at approx age of 70. His disease course has been improving. There are no hypoglycemic associated symptoms. Associated symptoms include foot paresthesias.  There are no hypoglycemic complications. Symptoms are stable. Diabetic complications include heart disease, nephropathy, peripheral neuropathy and PVD. Risk factors for coronary artery disease include diabetes mellitus, dyslipidemia, family history, male sex and hypertension. Current diabetic treatment includes intensive insulin program. He is compliant with treatment most of the time. His weight is fluctuating minimally. He is following a generally healthy diet. Meal planning includes avoidance of concentrated sweets. He has had a previous visit with a dietitian. He participates in exercise intermittently. His home blood glucose trend is decreasing steadily. His overall blood glucose range is >200 mg/dl. (He presents today, accompanied by his wife, with his CGM slight improvement in glycemic profile.  He was not due for another A1c today.  Analysis of his CGM shows TIR 40%, TAR 60%, TBR 0% with a GMI of 8.2%.  He is still eating lunch and supper too close together.  Says he sleeps late and stays up late.  Generally only eats 2 meals per day.  ) An ACE inhibitor/angiotensin II receptor blocker is being taken. He sees a podiatrist.Eye exam is current.     Review of systems  Constitutional: + Minimally fluctuating body weight, current Body mass index is 27.41 kg/m., no fatigue, no subjective hyperthermia, no subjective hypothermia Eyes: no blurry vision, no xerophthalmia ENT: no sore throat, no nodules palpated in throat, no dysphagia/odynophagia, no  hoarseness Cardiovascular: no chest pain, no shortness of breath, no palpitations, no leg swelling Respiratory: no cough, no shortness of breath Gastrointestinal: no nausea/vomiting/diarrhea Musculoskeletal: no muscle/joint aches, walks with cane Skin: no rashes, no hyperemia Neurological: no tremors, + numbness/tingling to BLE, no dizziness Psychiatric: no depression, no anxiety   Objective:     BP 136/70 (BP Location: Right Arm, Patient Position: Sitting, Cuff Size: Large)   Pulse 66   Ht 6\' 6"  (1.981 m)   Wt 237 lb 3.2 oz (107.6 kg)   BMI 27.41 kg/m   Wt Readings from Last 3 Encounters:  05/19/22 237 lb 3.2 oz (107.6 kg)  05/09/22 240 lb (108.9 kg)  04/20/22 236 lb 4.8 oz (107.2 kg)     BP Readings from Last 3 Encounters:  05/19/22 136/70  05/09/22 (!) 140/80  04/20/22 (!) 144/78     Physical Exam- Limited  Constitutional:  Body mass index is 27.41 kg/m. , not in acute distress, normal state of mind Eyes:  EOMI, no exophthalmos Musculoskeletal: no gross deformities, strength intact in all four extremities, no gross restriction of joint movements, walks with cane Skin:  no rashes, no hyperemia Neurological: no tremor with outstretched hands    CMP ( most recent) CMP     Component Value Date/Time   NA 139 05/10/2022 0917   K 4.8 05/10/2022 0917   CL 104 05/10/2022 0917   CO2 20 05/10/2022 0917   GLUCOSE 212 (H) 05/10/2022 0917   GLUCOSE 295 (H) 05/12/2021 2120   BUN 14 05/10/2022 0917   CREATININE 1.42 (H) 05/10/2022 0917   CREATININE 1.34 (H) 02/02/2021 0839   CALCIUM 9.6 05/10/2022 0917   PROT 6.0 05/10/2022 0917   ALBUMIN 3.9 05/10/2022 0917   AST 21 05/10/2022 0917   ALT 22 05/10/2022 0917   ALKPHOS 147 (H) 05/10/2022 0917   BILITOT 1.1 05/10/2022 0917   GFRNONAA 44 (L) 05/12/2021 2120   GFRNONAA 54 (L) 04/29/2020 1141   GFRAA 63 04/29/2020 1141     Diabetic Labs (most recent): Lab Results  Component Value Date   HGBA1C 10.1 (A) 04/20/2022    HGBA1C  8.3 (A) 01/18/2022   HGBA1C 9.7 (A) 09/29/2021   MICROALBUR 5.0 02/02/2021   MICROALBUR 1.4 09/22/2019   MICROALBUR 0.67 12/17/2006     Lipid Panel ( most recent) Lipid Panel     Component Value Date/Time   CHOL 125 05/10/2022 0917   TRIG 68 05/10/2022 0917   HDL 46 05/10/2022 0917   CHOLHDL 2.7 05/10/2022 0917   CHOLHDL 3.2 11/03/2020 1032   VLDL 12 06/17/2008 0304   LDLCALC 65 05/10/2022 0917   LDLCALC 73 11/03/2020 1032   LABVLDL 14 05/10/2022 0917      Lab Results  Component Value Date   TSH 1.730 05/10/2022   TSH 2.140 12/23/2021   TSH 1.790 06/14/2021   TSH 1.45 11/03/2020   TSH 1.75 03/31/2020   TSH 1.17 09/22/2019   TSH 0.94 03/19/2019   TSH 1.039 09/20/2017   TSH 0.26 (A) 03/27/2014   TSH 1.746 06/17/2008   FREET4 1.45 05/10/2022   FREET4 1.41 12/23/2021   FREET4 1.3 09/22/2019   FREET4 1.3 03/19/2019   FREET4 1.32 07/31/2011   FREET4 1.09 06/17/2008   FREET4 1.31 12/18/2007   FREET4 1.27 05/10/2006           Assessment & Plan:   1) Type 2 diabetes mellitus with stage 3a chronic kidney disease, with long-term current use of insulin (HCC)  He presents today, accompanied by his wife, with his CGM slight improvement in glycemic profile.  He was not due for another A1c today.  Analysis of his CGM shows TIR 40%, TAR 60%, TBR 0% with a GMI of 8.2%.  He is still eating lunch and supper too close together.  Says he sleeps late and stays up late.  Generally only eats 2 meals per day.    - Dylan Robles has currently uncontrolled symptomatic type 2 DM since 82 years of age.   -Recent labs reviewed.  - I had a long discussion with him about the progressive nature of diabetes and the pathology behind its complications. -his diabetes is complicated by CAD, CKD, PVD, neuropathy and he remains at a high risk for more acute and chronic complications which include CAD, CVA, CKD, retinopathy, and neuropathy. These are all discussed in detail with  him.  The following Lifestyle Medicine recommendations according to American College of Lifestyle Medicine [facility]) were discussed and offered to patient and he agrees to start the journey:  A. Whole Foods, Plant-based plate comprising of fruits and vegetables, plant-based proteins, whole-grain carbohydrates was discussed in detail with the patient.   A list for source of those nutrients were also provided to the patient.  Patient will use only water or unsweetened tea for hydration. B.  The need to stay away from risky substances including alcohol, smoking; obtaining 7 to 9 hours of restorative sleep, at least 150 minutes of moderate intensity exercise weekly, the importance of healthy social connections,  and stress reduction techniques were discussed. C.  A full color page of  Calorie density of various food groups per pound showing examples of each food groups was provided to the patient.  - Nutritional counseling repeated at each appointment due to patients tendency to fall back in to old habits.  - The patient admits there is a room for improvement in their diet and drink choices. -  Suggestion is made for the patient to avoid simple carbohydrates from their diet including Cakes, Sweet Desserts / Pastries, Ice Cream, Soda (diet and regular), Sweet Tea, Candies, Chips, Cookies, Sweet Pastries,  Store Bought Juices, Alcohol in Excess of 1-2 drinks a day, Artificial Sweeteners, Coffee Creamer, and "Sugar-free" Products. This will help patient to have stable blood glucose profile and potentially avoid unintended weight gain.   - I encouraged the patient to switch to unprocessed or minimally processed complex starch and increased protein intake (animal or plant source), fruits, and vegetables.   - Patient is advised to stick to a routine mealtimes to eat 3 meals a day and avoid unnecessary snacks (to snack only to correct hypoglycemia).  - I have approached him with the following individualized plan to  manage his diabetes and patient agrees:   -He is advised to continue his Tresiba 16 units SQ nightly and increase Novolog to 10-16 units TID with meals if glucose is above 90 and he is eating (Specific instructions on how to titrate insulin dosage based on glucose readings given to patient in writing).  I also advised him to separate out his meals a bit more to allow time for glucose to come to normal before eating again.  -he is encouraged to continue monitoring glucose 4 times daily (using his CGM), before meals and before bed, and to call the clinic if he has readings less than 70 or above 300 for 3 tests in a row.  - he is warned not to take insulin without proper monitoring per orders. - Adjustment parameters are given to him for hypo and hyperglycemia in writing.  - his Glipizide was be discontinued, risk outweighs benefit for this patient- risk of hypoglycemia given advanced age and CKD. - he is not a candidate for full dose Metformin due to concurrent renal insufficiency, I discontinued this at last visit to de-escalate his treatment and preserve kidney function.  - he is not an ideal candidate for incretin therapy due to body habitus with BMI at 25.  - Specific targets for  A1c; LDL, HDL, and Triglycerides were discussed with the patient.  2) Blood Pressure /Hypertension:  his blood pressure is controlled to target for his age.   he is advised to continue his current medications including Norvasc 5 mg p.o. daily with breakfast, Lasix 20 mg po daily as needed, Lisinopril 20 mg po daily and Metoprolol 50 mg po daily.  3) Lipids/Hyperlipidemia:    Review of his recent lipid panel from 05/10/22 showed controlled LDL at 65.  he is advised to continue Lipitor 80 mg daily at bedtime and Zetia 10 mg po daily.  Side effects and precautions discussed with him.    4)  Weight/Diet:  his Body mass index is 27.41 kg/m.  -  he is NOT a candidate for weight loss.  Exercise, and detailed carbohydrates  information provided  -  detailed on discharge instructions.  5) Hypothyroidism-unspecified The details surrounding his diagnosis are not available.    His previsit TFTs are consistent with appropriate hormone replacement.  He is advised to continue his Levothyroxine 88 mcg po daily before breakfast.     - The correct intake of thyroid hormone (Levothyroxine, Synthroid), is on empty stomach first thing in the morning, with water, separated by at least 30 minutes from breakfast and other medications,  and separated by more than 4 hours from calcium, iron, multivitamins, acid reflux medications (PPIs).  - This medication is a life-long medication and will be needed to correct thyroid hormone imbalances for the rest of your life.  The dose may change from time to time, based on thyroid blood work.  - It is extremely  important to be consistent taking this medication, near the same time each morning.  -AVOID TAKING PRODUCTS CONTAINING BIOTIN (commonly found in Hair, Skin, Nails vitamins) AS IT INTERFERES WITH THE VALIDITY OF THYROID FUNCTION BLOOD TESTS.  6) Vitamin D deficiency His most recent vitamin D level from 05/10/22 was slightly low at 28.8.  He is already taking supplement but not sure of the amount.  I advised to take Vitamin d3 5000 units daily.  7) Chronic Care/Health Maintenance: -he is on ACEI/ARB and Statin medications and is encouraged to initiate and continue to follow up with Ophthalmology, Dentist, Podiatrist at least yearly or according to recommendations, and advised to stay away from smoking. I have recommended yearly flu vaccine and pneumonia vaccine at least every 5 years; moderate intensity exercise for up to 150 minutes weekly; and sleep for at least 7 hours a day.  - he is advised to maintain close follow up with Durwin Nora, Lucina Mellow, MD for primary care needs, as well as his other providers for optimal and coordinated care.     I spent  25  minutes in the care of the  patient today including review of labs from CMP, Lipids, Thyroid Function, Hematology (current and previous including abstractions from other facilities); face-to-face time discussing  his blood glucose readings/logs, discussing hypoglycemia and hyperglycemia episodes and symptoms, medications doses, his options of short and long term treatment based on the latest standards of care / guidelines;  discussion about incorporating lifestyle medicine;  and documenting the encounter. Risk reduction counseling performed per USPSTF guidelines to reduce obesity and cardiovascular risk factors.     Please refer to Patient Instructions for Blood Glucose Monitoring and Insulin/Medications Dosing Guide"  in media tab for additional information. Please  also refer to " Patient Self Inventory" in the Media  tab for reviewed elements of pertinent patient history.  Dylan Robles participated in the discussions, expressed understanding, and voiced agreement with the above plans.  All questions were answered to his satisfaction. he is encouraged to contact clinic should he have any questions or concerns prior to his return visit.     Follow up plan: - Return in about 3 months (around 08/19/2022) for Diabetes F/U with A1c in office, No previsit labs, Bring meter and logs.  Ronny Bacon, [facility] [address] [place], [place] [address] Phone: [phone] Fax: [phone]  05/19/2022, 11:11 AM

## 2022-05-23 DIAGNOSIS — H524 Presbyopia: Secondary | ICD-10-CM | POA: Diagnosis not present

## 2022-05-23 LAB — HM DIABETES EYE EXAM

## 2022-06-07 ENCOUNTER — Other Ambulatory Visit: Payer: Self-pay | Admitting: Internal Medicine

## 2022-06-08 ENCOUNTER — Encounter: Payer: Self-pay | Admitting: Cardiovascular Disease

## 2022-06-08 ENCOUNTER — Ambulatory Visit: Payer: Medicare HMO | Admitting: Cardiovascular Disease

## 2022-06-08 VITALS — BP 126/66 | HR 74 | Ht 78.0 in | Wt 234.6 lb

## 2022-06-08 DIAGNOSIS — E782 Mixed hyperlipidemia: Secondary | ICD-10-CM

## 2022-06-08 DIAGNOSIS — E1159 Type 2 diabetes mellitus with other circulatory complications: Secondary | ICD-10-CM | POA: Diagnosis not present

## 2022-06-08 DIAGNOSIS — I340 Nonrheumatic mitral (valve) insufficiency: Secondary | ICD-10-CM | POA: Diagnosis not present

## 2022-06-08 DIAGNOSIS — I10 Essential (primary) hypertension: Secondary | ICD-10-CM

## 2022-06-08 DIAGNOSIS — I25118 Atherosclerotic heart disease of native coronary artery with other forms of angina pectoris: Secondary | ICD-10-CM | POA: Diagnosis not present

## 2022-06-08 DIAGNOSIS — R0602 Shortness of breath: Secondary | ICD-10-CM | POA: Diagnosis not present

## 2022-06-08 DIAGNOSIS — I739 Peripheral vascular disease, unspecified: Secondary | ICD-10-CM

## 2022-06-08 DIAGNOSIS — I351 Nonrheumatic aortic (valve) insufficiency: Secondary | ICD-10-CM

## 2022-06-08 NOTE — Progress Notes (Signed)
Cardiology Office Note   Date:  06/08/2022   ID:  Dylan Robles, DOB 1940/11/27, MRN [id number]  PCP:  Billie Lade, MD  Cardiologist:  Adrian Blackwater, MD      History of Present Illness: Dylan Robles is a 82 y.o. male who presents for  Chief Complaint  Patient presents with   Follow-up    1 month follow up    Shortness of Breath This is a chronic problem. The current episode started more than 1 year ago. The problem has been unchanged.      Past Medical History:  Diagnosis Date   Acute lower UTI 09/20/2017   Bradycardia on ECG 07/31/2011   Coronary artery disease    hyperlipidemia   Decreased GFR 09/23/2019   Dehydration 09/21/2017   Diabetes mellitus    Diabetes mellitus without complication (HCC)    Hypercholesterolemia    Hypertension    Hypothyroidism    Left arm pain 01/29/2020   Paronychia of great toe of left foot 09/26/2018   Paronychia of great toe, left 09/26/2018   PERIPHERAL NEUROPATHY 01/13/2006   Qualifier: Diagnosis of  By: Jen Mow MD, Christine     Shortness of breath dyspnea    with exertion   Vasovagal syncope    Weakness 09/20/2017     Past Surgical History:  Procedure Laterality Date   APPENDECTOMY     CARDIAC CATHETERIZATION N/A 06/16/2014   Procedure: Left Heart Cath;  Surgeon: Laurier Nancy, MD;  Location: [facility];  Service: Cardiovascular;  Laterality: N/A;   CIRCUMCISION, NON-NEWBORN     HERNIA REPAIR     LOWER EXTREMITY ANGIOGRAPHY Left 12/24/2018   Procedure: LOWER EXTREMITY ANGIOGRAPHY;  Surgeon: Renford Dills, MD;  Location: [facility];  Service: Cardiovascular;  Laterality: Left;   THYROID SURGERY       Current Outpatient Medications  Medication Sig Dispense Refill   amLODipine (NORVASC) 5 MG tablet TAKE 1 TABLET EVERY DAY 90 tablet 3   aspirin EC 81 MG tablet Take 81 mg by mouth daily.      atorvastatin (LIPITOR) 80 MG tablet TAKE 1 TABLET AT BEDTIME. 90 tablet 1   BRILINTA 60 MG TABS tablet TAKE 1  TABLET TWICE DAILY 180 tablet 3   cetirizine (ZYRTEC) 5 MG tablet Take 1 tablet (5 mg total) by mouth daily. 90 tablet 1   Continuous Blood Gluc Sensor (FREESTYLE LIBRE 2 SENSOR) MISC Use to check glucose at least 4 times daily 6 each 3   ezetimibe (ZETIA) 10 MG tablet Take 10 mg by mouth daily.     Fish Oil-Vitamin D 1000-1000 MG-UNIT CAPS Take by mouth once. daily     furosemide (LASIX) 20 MG tablet Take 20 mg by mouth as needed.     gabapentin (NEURONTIN) 300 MG capsule Take 1 capsule (300 mg total) by mouth 3 (three) times daily. 270 capsule 1   Glucagon (GVOKE HYPOPEN 2-PACK) 1 MG/0.2ML SOAJ Inject 1 mg into the skin daily as needed. 0.4 mL 1   insulin aspart (NOVOLOG FLEXPEN) 100 UNIT/ML FlexPen Inject 10-16 Units into the skin 3 (three) times daily with meals. 30 mL 3   insulin degludec (TRESIBA FLEXTOUCH) 100 UNIT/ML FlexTouch Pen Inject 16 Units into the skin at bedtime. 15 mL 3   isosorbide mononitrate (IMDUR) 30 MG 24 hr tablet TAKE 1 TABLET EVERY DAY 90 tablet 3   levothyroxine (SYNTHROID) 88 MCG tablet TAKE 1 TABLET EVERY DAY BEFORE BREAKFAST  90 tablet 3   lisinopril (ZESTRIL) 20 MG tablet Take 1 tablet (20 mg total) by mouth daily. 90 tablet 3   metoprolol succinate (TOPROL-XL) 50 MG 24 hr tablet TAKE 1 TABLET EVERY DAY 90 tablet 0   spironolactone (ALDACTONE) 25 MG tablet Take 1 tablet (25 mg total) by mouth daily. 30 tablet 11   tamsulosin (FLOMAX) 0.4 MG CAPS capsule TAKE 1 CAPSULE AT BEDTIME 90 capsule 1   isosorbide mononitrate (IMDUR) 60 MG 24 hr tablet Take 1 tablet (60 mg total) by mouth daily. 30 tablet 2   No current facility-administered medications for this visit.    Allergies:   Plavix [clopidogrel]    Social History:   reports that he has quit smoking. He has never used smokeless tobacco. He reports that he does not drink alcohol and does not use drugs.   Family History:  family history includes Cancer in his mother; Diabetes in his brother and father;  Hypertension in his father.    ROS:     Review of Systems  Constitutional: Negative.   HENT: Negative.    Eyes: Negative.   Respiratory:  Positive for shortness of breath.   Gastrointestinal: Negative.   Genitourinary: Negative.   Musculoskeletal: Negative.   Skin: Negative.   Neurological: Negative.   Endo/Heme/Allergies: Negative.   Psychiatric/Behavioral: Negative.    All other systems reviewed and are negative.     All other systems are reviewed and negative.    PHYSICAL EXAM: VS:  BP 126/66   Pulse 74   Ht 6\' 6"  (1.981 m)   Wt 234 lb 9.6 oz (106.4 kg)   SpO2 97%   BMI 27.11 kg/m  , BMI Body mass index is 27.11 kg/m. Last weight:  Wt Readings from Last 3 Encounters:  06/08/22 234 lb 9.6 oz (106.4 kg)  05/19/22 237 lb 3.2 oz (107.6 kg)  05/09/22 240 lb (108.9 kg)     Physical Exam Vitals reviewed.  Constitutional:      Appearance: Normal appearance. He is normal weight.  HENT:     Head: Normocephalic.     Nose: Nose normal.     Mouth/Throat:     Mouth: Mucous membranes are moist.  Eyes:     Pupils: Pupils are equal, round, and reactive to light.  Cardiovascular:     Rate and Rhythm: Normal rate and regular rhythm.     Pulses: Normal pulses.     Heart sounds: Normal heart sounds.  Pulmonary:     Effort: Pulmonary effort is normal.  Abdominal:     General: Abdomen is flat. Bowel sounds are normal.  Musculoskeletal:        General: Normal range of motion.     Cervical back: Normal range of motion.  Skin:    General: Skin is warm.  Neurological:     General: No focal deficit present.     Mental Status: He is alert.  Psychiatric:        Mood and Affect: Mood normal.       EKG:   Recent Labs: 11/10/2021: Hemoglobin 11.8; Platelets 258 05/10/2022: ALT 22; BUN 14; Creatinine, Ser 1.42; Potassium 4.8; Sodium 139; TSH 1.730    Lipid Panel    Component Value Date/Time   CHOL 125 05/10/2022 0917   TRIG 68 05/10/2022 0917   HDL 46 05/10/2022  0917   CHOLHDL 2.7 05/10/2022 0917   CHOLHDL 3.2 11/03/2020 1032   VLDL 12 06/17/2008 0304   LDLCALC 65 05/10/2022 0917  LDLCALC 73 11/03/2020 1032      TESTS                                                                                          [facility] [address] [place], [place] [address] [phone] STUDY:  Gated Stress / Rest Myocardial Perfusion Imaging Tomographic (SPECT) Including attenuation correction Wall Motion, Left Ventricular Ejection Fraction By Gated Technique.Treadmill Stress Test. SEX: Male  WEIGHT: 220 lbs  HEIGHT: 78 in      ARMS UP: YES/NO                                                                        REFERRING PHYSICIAN: Dr.Shellie Rogoff                                                                                                                                                                                                                       INDICATION FOR STUDY: R06.02  TECHNIQUE:  Approximately 20 minutes following the intravenous administration of 10.4 mCi of Tc-18m Sestamibi after stress testing in a reclined supine position with arms above their head if able to do so, gated SPECT imaging of the heart was performed. After about a 2hr break, the patient was injected intravenously with 31.3 mCi of Tc-64m Sestamibi.  Approximately 45 minutes later in the same position as stress imaging SPECT rest imaging of the heart was performed.  STRESS BY:  Adrian Blackwater, MD PROTOCOL:   Modified Bruce                                                                                       MAX PRED HR: 138                     85%: 117               75%: 104                                                                                                                    RESTING BP: 138/82  RESTING HR: 88 PEAK BP: 150/90  PEAK HR: 125 (90%)                                                                    EXERCISE DURATION: 2:09                                             METS: 3.7     REASON FOR TEST TERMINATION: Target reached/1 min post injection  SYMPTOMS: Fatigue. SOB.  [place] TREADMILL SCORE: 2                                       RISK: Moderate                                                                                                                                                                                                           EKG RESULTS: NSR. 90/min. Frequent PVC's, no significant ST changes at peak exercise.                                                             IMAGE QUALITY:  Good  PERFUSION/WALL MOTION FINDINGS: EF = 64%. No perfusion defects, normal wall motion.                                                                           IMPRESSION: Normal stress test with normal LVEF.                                                                                                                                                                                                                                                                                         Adrian Blackwater, MD Stress Interpreting Physician / Nuclear Interpreting Physician                         Adrian Blackwater MD  Electronically signed by: Adrian Blackwater     Date: 02/10/2021 11:28 REASON FOR  VISIT  Visit for: Echocardiogram/R06.02  Sex:   Male  wt=  218  lbs.  BP=130/82  Height= 78   inches.        TESTS  Imaging: Echocardiogram:  An echocardiogram in (2-d) mode was performed and in Doppler mode with color flow velocity mapping was performed. The aortic valve cusps are abnormal 2.2   cm, flow velocity 1.06   m/s, and systolic calculated mean flow gradient 3  mmHg. Mitral valve diastolic peak flow velocity E .489     m/s and E/A ratio 0.6. Aortic root diameter 2.9   cm. The LVOT internal diameter 3.3 cm and flow velocity was abnormal .752  m/s. LV systolic dimension 3.01   cm, diastolic 4.32  cm, posterior wall thickness 1.05    cm, fractional shortening 30.3 %, and EF 58 %. IVS thickness 1.5   cm. LA dimension 2.6 cm. Mitral Valve has Mild Regurgitation. Tricuspid Valve has Mild Regurgitation.     ASSESSMENT  Technically adequate study.  Normal chamber sizes.  Normal left ventricular systolic function.  Mild left ventricular hypertrophy with GRADE 1 (relaxation abnormality) diastolic dysfunction.  Normal right ventricular systolic function.  Normal right ventricular diastolic function.  Normal left ventricular wall motion.  Normal right ventricular wall motion.  Mild tricuspid regurgitation.  Borderline pulmonary hypertension.  Mild mitral regurgitation.  Fibrocalcified aortic valve.  No pericardial effusion.  Moderately dilated Left atrium  Mildly dilated Right atrium  Moderate LVH.     THERAPY   Referring physician: Laurier Nancy  Sonographer: Adrian Blackwater.      Adrian Blackwater MD  Electronically signed by: Adrian Blackwater     Date: 02/10/2021 13:21 Other studies Reviewed: Additional studies/ records that were reviewed today include:  Review of the above records demonstrates:       No data to display            ASSESSMENT AND PLAN:    ICD-10-CM   1. SOB (shortness of breath)  R06.02    on exertion only, has cpmpensateed HEpEF    2.  Coronary artery disease of native artery of native heart with stable angina pectoris (HCC)  I25.118     3. Essential hypertension  I10     4. PERIPHERAL VASCULAR DISEASE  I73.9     5. Type 2 diabetes mellitus with vascular disease (HCC)  E11.59     6. Mixed hyperlipidemia  E78.2     7. Nonrheumatic mitral valve regurgitation  I34.0    mild    8. Nonrheumatic aortic valve insufficiency  I35.1    mild       Problem List Items Addressed This Visit       Cardiovascular and Mediastinum   Essential hypertension   PERIPHERAL VASCULAR DISEASE   Coronary artery disease of native artery of native heart with stable angina pectoris (HCC)   Type 2 diabetes mellitus with vascular disease (HCC)     Other   Hyperlipidemia   SOB (shortness of breath) - Primary   Other Visit Diagnoses     Nonrheumatic mitral valve regurgitation       mild   Nonrheumatic aortic valve insufficiency       mild          Disposition:   Return in about 3 months (around 09/08/2022).    Total time spent: 30 minutes  Signed,  Adrian Blackwater, MD  06/08/2022 10:53 AM    [facility]

## 2022-06-14 ENCOUNTER — Encounter: Payer: Self-pay | Admitting: Internal Medicine

## 2022-06-14 ENCOUNTER — Ambulatory Visit (INDEPENDENT_AMBULATORY_CARE_PROVIDER_SITE_OTHER): Payer: Medicare HMO | Admitting: Internal Medicine

## 2022-06-14 VITALS — BP 104/61 | HR 70 | Ht 78.0 in | Wt 235.0 lb

## 2022-06-14 DIAGNOSIS — I739 Peripheral vascular disease, unspecified: Secondary | ICD-10-CM | POA: Diagnosis not present

## 2022-06-14 DIAGNOSIS — I872 Venous insufficiency (chronic) (peripheral): Secondary | ICD-10-CM

## 2022-06-14 DIAGNOSIS — Z87438 Personal history of other diseases of male genital organs: Secondary | ICD-10-CM

## 2022-06-14 DIAGNOSIS — E1142 Type 2 diabetes mellitus with diabetic polyneuropathy: Secondary | ICD-10-CM | POA: Diagnosis not present

## 2022-06-14 DIAGNOSIS — E1159 Type 2 diabetes mellitus with other circulatory complications: Secondary | ICD-10-CM | POA: Diagnosis not present

## 2022-06-14 DIAGNOSIS — I10 Essential (primary) hypertension: Secondary | ICD-10-CM

## 2022-06-14 DIAGNOSIS — R067 Sneezing: Secondary | ICD-10-CM

## 2022-06-14 DIAGNOSIS — N400 Enlarged prostate without lower urinary tract symptoms: Secondary | ICD-10-CM | POA: Diagnosis not present

## 2022-06-14 DIAGNOSIS — N1831 Chronic kidney disease, stage 3a: Secondary | ICD-10-CM | POA: Diagnosis not present

## 2022-06-14 DIAGNOSIS — I25118 Atherosclerotic heart disease of native coronary artery with other forms of angina pectoris: Secondary | ICD-10-CM

## 2022-06-14 MED ORDER — TRIAMCINOLONE ACETONIDE 0.1 % EX CREA
1.0000 | TOPICAL_CREAM | Freq: Two times a day (BID) | CUTANEOUS | 1 refills | Status: DC
Start: 2022-06-14 — End: 2023-01-22

## 2022-06-14 MED ORDER — CETIRIZINE HCL 5 MG PO TABS: 5.0000 mg | ORAL_TABLET | Freq: Every day | ORAL | 1 refills | Status: DC

## 2022-06-14 MED ORDER — GABAPENTIN 300 MG PO CAPS
300.0000 mg | ORAL_CAPSULE | Freq: Three times a day (TID) | ORAL | 1 refills | Status: DC
Start: 2022-06-14 — End: 2022-10-20

## 2022-06-14 MED ORDER — TAMSULOSIN HCL 0.4 MG PO CAPS: 0.4000 mg | ORAL_CAPSULE | Freq: Every day | ORAL | 1 refills | Status: DC

## 2022-06-14 NOTE — Assessment & Plan Note (Signed)
BP 104/61 today.  Remains well-controlled on current antihypertensive regimen of Imdur, lisinopril, and metoprolol succinate.

## 2022-06-14 NOTE — Assessment & Plan Note (Signed)
Denies recent chest pain.  Recently seen by his cardiologist (Dr. Welton Flakes) for follow-up.  Remains on DAPT (Brilinta/ASA) and statin therapy.

## 2022-06-14 NOTE — Assessment & Plan Note (Signed)
Recently seen by vascular surgery for follow-up.  He remains on atorvastatin and DAPT (Brilinta/ASA).

## 2022-06-14 NOTE — Assessment & Plan Note (Signed)
There is evidence of venous stasis dermatitis present on the right shin.  Triamcinolone 0.1% cream has been prescribed for twice daily application.  He was also encouraged to purchase compression stockings to alleviate right ankle edema.

## 2022-06-14 NOTE — Assessment & Plan Note (Signed)
Symptoms remain well-controlled with Flomax.

## 2022-06-14 NOTE — Assessment & Plan Note (Signed)
Followed by endocrinology.  Last seen in April.  A1c has increased to 10.1.  He is focusing on dietary changes in an effort to improve his A1c.  Endocrinology follow-up is scheduled for later this month.

## 2022-06-14 NOTE — Assessment & Plan Note (Signed)
Renal function stable on latest labs.  He has establish care with nephrology (Dr. Wolfgang Phoenix).  Last seen in April.  Reports that he will return to care in August.

## 2022-06-14 NOTE — Patient Instructions (Signed)
It was a pleasure to see you today.  Thank you for giving [place] the opportunity to be involved in your care.  Below is a brief recap of your visit and next steps.  We will plan to see you again in 3 months.  Summary Triamcinolone cream added today for right leg wound Follow up in 3 months for routine care

## 2022-06-14 NOTE — Progress Notes (Signed)
Established Patient Office Visit  Subjective   Patient ID: Dylan Robles, male    DOB: 12-Apr-1940  Age: 82 y.o. MRN: [id number]  Chief Complaint  Patient presents with   Hypertension    Three month follow up    Dylan Robles returns to care today for routine follow-up.  He was last evaluated by me on 3/4 for HTN follow-up.  No additional medication changes were made at that time.  In the interim he has been seen by vascular surgery, endocrinology, and cardiology for follow-up.  Dylan Robles reports feeling well today.  He is asymptomatic and has no acute concerns to discuss.  He reports today that he has also recently establish care with nephrology (Dylan Robles).  Last seen in April.  Past Medical History:  Diagnosis Date   Acute lower UTI 09/20/2017   Bradycardia on ECG 07/31/2011   Coronary artery disease    hyperlipidemia   Decreased GFR 09/23/2019   Dehydration 09/21/2017   Diabetes mellitus    Diabetes mellitus without complication (HCC)    Hypercholesterolemia    Hypertension    Hypothyroidism    Left arm pain 01/29/2020   Paronychia of great toe of left foot 09/26/2018   Paronychia of great toe, left 09/26/2018   PERIPHERAL NEUROPATHY 01/13/2006   Qualifier: Diagnosis of  By: Jen Mow MD, Christine     Shortness of breath dyspnea    with exertion   Vasovagal syncope    Weakness 09/20/2017   Past Surgical History:  Procedure Laterality Date   APPENDECTOMY     CARDIAC CATHETERIZATION N/A 06/16/2014   Procedure: Left Heart Cath;  Surgeon: Laurier Nancy, MD;  Location: [facility];  Service: Cardiovascular;  Laterality: N/A;   CIRCUMCISION, NON-NEWBORN     HERNIA REPAIR     LOWER EXTREMITY ANGIOGRAPHY Left 12/24/2018   Procedure: LOWER EXTREMITY ANGIOGRAPHY;  Surgeon: Renford Dills, MD;  Location: [facility];  Service: Cardiovascular;  Laterality: Left;   THYROID SURGERY     Social History   Tobacco Use   Smoking status: Former   Smokeless tobacco: Never   Vaping Use   Vaping Use: Never used  Substance Use Topics   Alcohol use: No   Drug use: No   Family History  Problem Relation Age of Onset   Diabetes Father    Hypertension Father    Cancer Mother    Diabetes Brother    Allergies  Allergen Reactions   Plavix [Clopidogrel] Hives   Review of Systems  Constitutional:  Negative for chills and fever.  HENT:  Negative for sore throat.   Respiratory:  Negative for cough and shortness of breath.   Cardiovascular:  Negative for chest pain, palpitations and leg swelling.  Gastrointestinal:  Negative for abdominal pain, blood in stool, constipation, diarrhea, nausea and vomiting.  Genitourinary:  Negative for dysuria and hematuria.  Musculoskeletal:  Negative for myalgias.  Skin:  Negative for itching and rash.  Neurological:  Negative for dizziness and headaches.  Psychiatric/Behavioral:  Negative for depression and suicidal ideas.      Objective:     BP 104/61   Pulse 70   Ht 6\' 6"  (1.981 m)   Wt 235 lb (106.6 kg)   SpO2 96%   BMI 27.16 kg/m  BP Readings from Last 3 Encounters:  06/14/22 104/61  06/08/22 126/66  05/19/22 136/70   Physical Exam Vitals reviewed.  Constitutional:      General: He is not in  acute distress.    Appearance: Normal appearance. He is not ill-appearing.  HENT:     Head: Normocephalic and atraumatic.     Right Ear: External ear normal.     Left Ear: External ear normal.     Nose: Nose normal. No congestion or rhinorrhea.     Mouth/Throat:     Mouth: Mucous membranes are moist.     Pharynx: Oropharynx is clear.  Eyes:     General: No scleral icterus.    Extraocular Movements: Extraocular movements intact.     Conjunctiva/sclera: Conjunctivae normal.     Pupils: Pupils are equal, round, and reactive to light.  Cardiovascular:     Rate and Rhythm: Normal rate and regular rhythm.     Pulses: Normal pulses.     Heart sounds: Normal heart sounds. No murmur heard. Pulmonary:     Effort:  Pulmonary effort is normal.     Breath sounds: Normal breath sounds. No wheezing, rhonchi or rales.  Abdominal:     General: Abdomen is flat. Bowel sounds are normal. There is no distension.     Palpations: Abdomen is soft.     Tenderness: There is no abdominal tenderness.  Musculoskeletal:        General: Deformity (2 x 1 cm nonpurulent wound present on the right shin) present. No swelling. Normal range of motion.     Cervical back: Normal range of motion.     Right lower leg: Edema (2+ edema around the right ankle) present.     Left lower leg: No edema.  Skin:    General: Skin is warm and dry.     Capillary Refill: Capillary refill takes less than 2 seconds.     Findings: Rash (Venous stasis dermatitis-right shin) present.  Neurological:     General: No focal deficit present.     Mental Status: He is alert and oriented to person, place, and time.     Motor: No weakness.  Psychiatric:        Mood and Affect: Mood normal.        Behavior: Behavior normal.        Thought Content: Thought content normal.   Last CBC Lab Results  Component Value Date   WBC 6.9 11/10/2021   HGB 11.8 (L) 11/10/2021   HCT 36.3 (L) 11/10/2021   MCV 92 11/10/2021   MCH 29.9 11/10/2021   RDW 13.6 11/10/2021   PLT 258 11/10/2021   Last metabolic panel Lab Results  Component Value Date   GLUCOSE 212 (H) 05/10/2022   NA 139 05/10/2022   K 4.8 05/10/2022   CL 104 05/10/2022   CO2 20 05/10/2022   BUN 14 05/10/2022   CREATININE 1.42 (H) 05/10/2022   EGFR 49 (L) 05/10/2022   CALCIUM 9.6 05/10/2022   PROT 6.0 05/10/2022   ALBUMIN 3.9 05/10/2022   LABGLOB 2.1 05/10/2022   AGRATIO 1.9 05/10/2022   BILITOT 1.1 05/10/2022   ALKPHOS 147 (H) 05/10/2022   AST 21 05/10/2022   ALT 22 05/10/2022   ANIONGAP 11 05/12/2021   Last lipids Lab Results  Component Value Date   CHOL 125 05/10/2022   HDL 46 05/10/2022   LDLCALC 65 05/10/2022   TRIG 68 05/10/2022   CHOLHDL 2.7 05/10/2022   Last hemoglobin  A1c Lab Results  Component Value Date   HGBA1C 10.1 (A) 04/20/2022   Last thyroid functions Lab Results  Component Value Date   TSH 1.730 05/10/2022   Last vitamin D Lab  Results  Component Value Date   VD25OH 28.8 (L) 05/10/2022   Last vitamin B12 and Folate Lab Results  Component Value Date   VITAMINB12 490 11/10/2021   FOLATE 14.3 11/10/2021     Assessment & Plan:   Problem List Items Addressed This Visit       Essential hypertension    BP 104/61 today.  Remains well-controlled on current antihypertensive regimen of Imdur, lisinopril, and metoprolol succinate.      PERIPHERAL VASCULAR DISEASE    Recently seen by vascular surgery for follow-up.  He remains on atorvastatin and DAPT (Brilinta/ASA).      Coronary artery disease of native artery of native heart with stable angina pectoris [facility])    Denies recent chest pain.  Recently seen by his cardiologist (Dr. Welton Flakes) for follow-up.  Remains on DAPT (Brilinta/ASA) and statin therapy.      Type 2 diabetes mellitus with vascular disease (HCC)    Followed by endocrinology.  Last seen in April.  A1c has increased to 10.1.  He is focusing on dietary changes in an effort to improve his A1c.  Endocrinology follow-up is scheduled for later this month.      Venous stasis dermatitis of right lower extremity - Primary    There is evidence of venous stasis dermatitis present on the right shin.  Triamcinolone 0.1% cream has been prescribed for twice daily application.  He was also encouraged to purchase compression stockings to alleviate right ankle edema.      Stage 3a chronic kidney disease (HCC)    Renal function stable on latest labs.  He has establish care with nephrology (Dylan Robles).  Last seen in April.  Reports that he will return to care in August.      BPH (benign prostatic hyperplasia)    Symptoms remain well-controlled with Flomax.      Return in about 3 months (around 09/14/2022).   Billie Lade, MD

## 2022-06-21 ENCOUNTER — Other Ambulatory Visit: Payer: Self-pay | Admitting: Cardiovascular Disease

## 2022-06-29 ENCOUNTER — Ambulatory Visit (INDEPENDENT_AMBULATORY_CARE_PROVIDER_SITE_OTHER): Payer: Medicare HMO | Admitting: Podiatry

## 2022-06-29 DIAGNOSIS — Z91199 Patient's noncompliance with other medical treatment and regimen due to unspecified reason: Secondary | ICD-10-CM

## 2022-07-02 NOTE — Progress Notes (Signed)
1. No-show for appointment     

## 2022-07-12 ENCOUNTER — Other Ambulatory Visit: Payer: Self-pay | Admitting: Cardiovascular Disease

## 2022-07-12 DIAGNOSIS — I10 Essential (primary) hypertension: Secondary | ICD-10-CM

## 2022-07-22 DIAGNOSIS — E1165 Type 2 diabetes mellitus with hyperglycemia: Secondary | ICD-10-CM | POA: Diagnosis not present

## 2022-08-09 DIAGNOSIS — E1129 Type 2 diabetes mellitus with other diabetic kidney complication: Secondary | ICD-10-CM | POA: Diagnosis not present

## 2022-08-09 DIAGNOSIS — I5032 Chronic diastolic (congestive) heart failure: Secondary | ICD-10-CM | POA: Diagnosis not present

## 2022-08-09 DIAGNOSIS — N2581 Secondary hyperparathyroidism of renal origin: Secondary | ICD-10-CM | POA: Diagnosis not present

## 2022-08-09 DIAGNOSIS — I129 Hypertensive chronic kidney disease with stage 1 through stage 4 chronic kidney disease, or unspecified chronic kidney disease: Secondary | ICD-10-CM | POA: Diagnosis not present

## 2022-08-09 DIAGNOSIS — D638 Anemia in other chronic diseases classified elsewhere: Secondary | ICD-10-CM | POA: Diagnosis not present

## 2022-08-09 DIAGNOSIS — N1832 Chronic kidney disease, stage 3b: Secondary | ICD-10-CM | POA: Diagnosis not present

## 2022-08-10 ENCOUNTER — Other Ambulatory Visit: Payer: Self-pay | Admitting: Internal Medicine

## 2022-08-10 DIAGNOSIS — I639 Cerebral infarction, unspecified: Secondary | ICD-10-CM

## 2022-08-10 DIAGNOSIS — I10 Essential (primary) hypertension: Secondary | ICD-10-CM

## 2022-08-10 HISTORY — DX: Cerebral infarction, unspecified: I63.9

## 2022-08-15 DIAGNOSIS — N2581 Secondary hyperparathyroidism of renal origin: Secondary | ICD-10-CM | POA: Diagnosis not present

## 2022-08-15 DIAGNOSIS — E1129 Type 2 diabetes mellitus with other diabetic kidney complication: Secondary | ICD-10-CM | POA: Diagnosis not present

## 2022-08-15 DIAGNOSIS — I5032 Chronic diastolic (congestive) heart failure: Secondary | ICD-10-CM | POA: Diagnosis not present

## 2022-08-15 DIAGNOSIS — N1832 Chronic kidney disease, stage 3b: Secondary | ICD-10-CM | POA: Diagnosis not present

## 2022-08-18 ENCOUNTER — Encounter: Payer: Self-pay | Admitting: Cardiovascular Disease

## 2022-08-18 ENCOUNTER — Ambulatory Visit: Payer: Medicare HMO | Admitting: Cardiovascular Disease

## 2022-08-18 VITALS — BP 130/65 | HR 60 | Ht 78.0 in | Wt 236.4 lb

## 2022-08-18 DIAGNOSIS — I10 Essential (primary) hypertension: Secondary | ICD-10-CM

## 2022-08-18 DIAGNOSIS — I70213 Atherosclerosis of native arteries of extremities with intermittent claudication, bilateral legs: Secondary | ICD-10-CM | POA: Diagnosis not present

## 2022-08-18 DIAGNOSIS — E1159 Type 2 diabetes mellitus with other circulatory complications: Secondary | ICD-10-CM | POA: Diagnosis not present

## 2022-08-18 DIAGNOSIS — E782 Mixed hyperlipidemia: Secondary | ICD-10-CM

## 2022-08-18 DIAGNOSIS — R0602 Shortness of breath: Secondary | ICD-10-CM

## 2022-08-18 DIAGNOSIS — I25118 Atherosclerotic heart disease of native coronary artery with other forms of angina pectoris: Secondary | ICD-10-CM | POA: Diagnosis not present

## 2022-08-18 NOTE — Progress Notes (Addendum)
Cardiology Office Note   Date:  08/18/2022   ID:  Dylan Robles, DOB Jun 09, 1940, MRN [id number]  PCP:  Billie Lade, MD  Cardiologist:  Adrian Blackwater, MD      History of Present Illness: Dylan Robles is a 82 y.o. male who presents for  Chief Complaint  Patient presents with   Follow-up    3 mo f/u    sob  Shortness of Breath The current episode started more than 1 month ago. The problem has been waxing and waning.      Past Medical History:  Diagnosis Date   Acute lower UTI 09/20/2017   Bradycardia on ECG 07/31/2011   Coronary artery disease    hyperlipidemia   Decreased GFR 09/23/2019   Dehydration 09/21/2017   Diabetes mellitus    Diabetes mellitus without complication (HCC)    Hypercholesterolemia    Hypertension    Hypothyroidism    Left arm pain 01/29/2020   Paronychia of great toe of left foot 09/26/2018   Paronychia of great toe, left 09/26/2018   PERIPHERAL NEUROPATHY 01/13/2006   Qualifier: Diagnosis of  By: Jen Mow MD, Christine     Shortness of breath dyspnea    with exertion   Vasovagal syncope    Weakness 09/20/2017     Past Surgical History:  Procedure Laterality Date   APPENDECTOMY     CARDIAC CATHETERIZATION N/A 06/16/2014   Procedure: Left Heart Cath;  Surgeon: Laurier Nancy, MD;  Location: [facility];  Service: Cardiovascular;  Laterality: N/A;   CIRCUMCISION, NON-NEWBORN     HERNIA REPAIR     LOWER EXTREMITY ANGIOGRAPHY Left 12/24/2018   Procedure: LOWER EXTREMITY ANGIOGRAPHY;  Surgeon: Renford Dills, MD;  Location: [facility];  Service: Cardiovascular;  Laterality: Left;   THYROID SURGERY       Current Outpatient Medications  Medication Sig Dispense Refill   aspirin EC 81 MG tablet Take 81 mg by mouth daily.      atorvastatin (LIPITOR) 80 MG tablet TAKE 1 TABLET AT BEDTIME. 90 tablet 1   BRILINTA 60 MG TABS tablet TAKE 1 TABLET TWICE DAILY 180 tablet 3   cetirizine (ZYRTEC) 5 MG tablet Take 1 tablet (5 mg total)  by mouth daily. 90 tablet 1   Continuous Blood Gluc Sensor (FREESTYLE LIBRE 2 SENSOR) MISC Use to check glucose at least 4 times daily 6 each 3   ezetimibe (ZETIA) 10 MG tablet TAKE 1 TABLET EVERY DAY 90 tablet 1   Fish Oil-Vitamin D 1000-1000 MG-UNIT CAPS Take by mouth once. daily     furosemide (LASIX) 20 MG tablet Take 20 mg by mouth as needed.     gabapentin (NEURONTIN) 300 MG capsule Take 1 capsule (300 mg total) by mouth 3 (three) times daily. 270 capsule 1   Glucagon (GVOKE HYPOPEN 2-PACK) 1 MG/0.2ML SOAJ Inject 1 mg into the skin daily as needed. 0.4 mL 1   insulin aspart (NOVOLOG FLEXPEN) 100 UNIT/ML FlexPen Inject 10-16 Units into the skin 3 (three) times daily with meals. 30 mL 3   insulin degludec (TRESIBA FLEXTOUCH) 100 UNIT/ML FlexTouch Pen Inject 16 Units into the skin at bedtime. 15 mL 3   isosorbide mononitrate (IMDUR) 30 MG 24 hr tablet TAKE 1 TABLET EVERY DAY 90 tablet 3   isosorbide mononitrate (IMDUR) 60 MG 24 hr tablet Take 1 tablet by mouth once daily 30 tablet 0   levothyroxine (SYNTHROID) 88 MCG tablet TAKE 1 TABLET EVERY  DAY BEFORE BREAKFAST 90 tablet 3   metoprolol succinate (TOPROL-XL) 50 MG 24 hr tablet TAKE 1 TABLET EVERY DAY 90 tablet 3   spironolactone (ALDACTONE) 25 MG tablet Take 1 tablet (25 mg total) by mouth daily. 30 tablet 11   tamsulosin (FLOMAX) 0.4 MG CAPS capsule Take 1 capsule (0.4 mg total) by mouth at bedtime. 90 capsule 1   triamcinolone cream (KENALOG) 0.1 % Apply 1 Application topically 2 (two) times daily. 15 g 1   No current facility-administered medications for this visit.    Allergies:   Plavix [clopidogrel]    Social History:   reports that he has quit smoking. He has never used smokeless tobacco. He reports that he does not drink alcohol and does not use drugs.   Family History:  family history includes Cancer in his mother; Diabetes in his brother and father; Hypertension in his father.    ROS:     Review of Systems   Constitutional: Negative.   HENT: Negative.    Eyes: Negative.   Respiratory:  Positive for shortness of breath.   Gastrointestinal: Negative.   Genitourinary: Negative.   Musculoskeletal: Negative.   Skin: Negative.   Neurological: Negative.   Endo/Heme/Allergies: Negative.   Psychiatric/Behavioral: Negative.    All other systems reviewed and are negative.     All other systems are reviewed and negative.    PHYSICAL EXAM: VS:  BP 130/65   Pulse 60   Ht 6\' 6"  (1.981 m)   Wt 236 lb 6.4 oz (107.2 kg)   SpO2 96%   BMI 27.32 kg/m  , BMI Body mass index is 27.32 kg/m. Last weight:  Wt Readings from Last 3 Encounters:  08/18/22 236 lb 6.4 oz (107.2 kg)  06/14/22 235 lb (106.6 kg)  06/08/22 234 lb 9.6 oz (106.4 kg)     Physical Exam Vitals reviewed.  Constitutional:      Appearance: Normal appearance. He is normal weight.  HENT:     Head: Normocephalic.     Nose: Nose normal.     Mouth/Throat:     Mouth: Mucous membranes are moist.  Eyes:     Pupils: Pupils are equal, round, and reactive to light.  Cardiovascular:     Rate and Rhythm: Normal rate and regular rhythm.     Pulses: Normal pulses.     Heart sounds: Normal heart sounds.  Pulmonary:     Effort: Pulmonary effort is normal.  Abdominal:     General: Abdomen is flat. Bowel sounds are normal.  Musculoskeletal:        General: Normal range of motion.     Cervical back: Normal range of motion.  Skin:    General: Skin is warm.  Neurological:     General: No focal deficit present.     Mental Status: He is alert.  Psychiatric:        Mood and Affect: Mood normal.       EKG:   Recent Labs: 11/10/2021: Hemoglobin 11.8; Platelets 258 05/10/2022: ALT 22; BUN 14; Creatinine, Ser 1.42; Potassium 4.8; Sodium 139; TSH 1.730    Lipid Panel    Component Value Date/Time   CHOL 125 05/10/2022 0917   TRIG 68 05/10/2022 0917   HDL 46 05/10/2022 0917   CHOLHDL 2.7 05/10/2022 0917   CHOLHDL 3.2 11/03/2020  1032   VLDL 12 06/17/2008 0304   LDLCALC 65 05/10/2022 0917   LDLCALC 73 11/03/2020 1032      Other studies Reviewed: Additional  studies/ records that were reviewed today include:  Review of the above records demonstrates:       No data to display            ASSESSMENT AND PLAN:    ICD-10-CM   1. Atherosclerosis of native artery of both lower extremities with intermittent claudication (HCC)  I70.213 MYOCARDIAL PERFUSION IMAGING    PCV ECHOCARDIOGRAM COMPLETE    2. Coronary artery disease of native artery of native heart with stable angina pectoris (HCC)  I25.118 MYOCARDIAL PERFUSION IMAGING    PCV ECHOCARDIOGRAM COMPLETE    3. Essential hypertension  I10 MYOCARDIAL PERFUSION IMAGING    PCV ECHOCARDIOGRAM COMPLETE   ZESTORIL STOPPED BY RENAL AS GFR 29    4. Type 2 diabetes mellitus with vascular disease (HCC)  E11.59 MYOCARDIAL PERFUSION IMAGING    PCV ECHOCARDIOGRAM COMPLETE    5. Mixed hyperlipidemia  E78.2 MYOCARDIAL PERFUSION IMAGING    PCV ECHOCARDIOGRAM COMPLETE    6. SOB (shortness of breath)  R06.02 MYOCARDIAL PERFUSION IMAGING    PCV ECHOCARDIOGRAM COMPLETE   WILL DO ECHO, STRESS TEST       Problem List Items Addressed This Visit       Cardiovascular and Mediastinum   Essential hypertension   Relevant Orders   MYOCARDIAL PERFUSION IMAGING   PCV ECHOCARDIOGRAM COMPLETE   Coronary artery disease of native artery of native heart with stable angina pectoris (HCC)   Relevant Orders   MYOCARDIAL PERFUSION IMAGING   PCV ECHOCARDIOGRAM COMPLETE   Type 2 diabetes mellitus with vascular disease (HCC)   Relevant Orders   MYOCARDIAL PERFUSION IMAGING   PCV ECHOCARDIOGRAM COMPLETE   Atherosclerosis of native arteries of extremity with intermittent claudication (HCC) - Primary   Relevant Orders   MYOCARDIAL PERFUSION IMAGING   PCV ECHOCARDIOGRAM COMPLETE     Other   Hyperlipidemia   Relevant Orders   MYOCARDIAL PERFUSION IMAGING   PCV ECHOCARDIOGRAM  COMPLETE   SOB (shortness of breath)   Relevant Orders   MYOCARDIAL PERFUSION IMAGING   PCV ECHOCARDIOGRAM COMPLETE       Disposition:   Return in about 2 weeks (around 09/01/2022) for ECHO, STRESS TEST AND F/U.    Total time spent: 30 minutes  Signed,  Adrian Blackwater, MD  08/18/2022 11:34 AM    [facility]

## 2022-08-20 ENCOUNTER — Emergency Department: Payer: Medicare HMO

## 2022-08-20 ENCOUNTER — Observation Stay: Payer: Medicare HMO

## 2022-08-20 ENCOUNTER — Other Ambulatory Visit: Payer: Self-pay

## 2022-08-20 ENCOUNTER — Inpatient Hospital Stay
Admission: EM | Admit: 2022-08-20 | Discharge: 2022-08-23 | DRG: 064 | Disposition: A | Discharge status: Home Health | Payer: Medicare HMO | Attending: Obstetrics and Gynecology | Admitting: Obstetrics and Gynecology

## 2022-08-20 DIAGNOSIS — I6782 Cerebral ischemia: Secondary | ICD-10-CM | POA: Diagnosis not present

## 2022-08-20 DIAGNOSIS — R9089 Other abnormal findings on diagnostic imaging of central nervous system: Secondary | ICD-10-CM | POA: Diagnosis not present

## 2022-08-20 DIAGNOSIS — I639 Cerebral infarction, unspecified: Secondary | ICD-10-CM | POA: Diagnosis not present

## 2022-08-20 DIAGNOSIS — I2510 Atherosclerotic heart disease of native coronary artery without angina pectoris: Secondary | ICD-10-CM | POA: Diagnosis present

## 2022-08-20 DIAGNOSIS — E7800 Pure hypercholesterolemia, unspecified: Secondary | ICD-10-CM | POA: Diagnosis present

## 2022-08-20 DIAGNOSIS — E1165 Type 2 diabetes mellitus with hyperglycemia: Secondary | ICD-10-CM | POA: Diagnosis present

## 2022-08-20 DIAGNOSIS — R29700 NIHSS score 0: Secondary | ICD-10-CM | POA: Diagnosis present

## 2022-08-20 DIAGNOSIS — R471 Dysarthria and anarthria: Secondary | ICD-10-CM | POA: Diagnosis present

## 2022-08-20 DIAGNOSIS — N1831 Chronic kidney disease, stage 3a: Secondary | ICD-10-CM | POA: Diagnosis present

## 2022-08-20 DIAGNOSIS — E1122 Type 2 diabetes mellitus with diabetic chronic kidney disease: Secondary | ICD-10-CM | POA: Diagnosis present

## 2022-08-20 DIAGNOSIS — Z7982 Long term (current) use of aspirin: Secondary | ICD-10-CM

## 2022-08-20 DIAGNOSIS — R29810 Facial weakness: Secondary | ICD-10-CM | POA: Diagnosis not present

## 2022-08-20 DIAGNOSIS — G8191 Hemiplegia, unspecified affecting right dominant side: Secondary | ICD-10-CM | POA: Diagnosis present

## 2022-08-20 DIAGNOSIS — I700 Atherosclerosis of aorta: Secondary | ICD-10-CM | POA: Diagnosis present

## 2022-08-20 DIAGNOSIS — R4701 Aphasia: Secondary | ICD-10-CM | POA: Diagnosis present

## 2022-08-20 DIAGNOSIS — I4891 Unspecified atrial fibrillation: Secondary | ICD-10-CM | POA: Diagnosis present

## 2022-08-20 DIAGNOSIS — R569 Unspecified convulsions: Secondary | ICD-10-CM | POA: Diagnosis not present

## 2022-08-20 DIAGNOSIS — G629 Polyneuropathy, unspecified: Secondary | ICD-10-CM | POA: Diagnosis present

## 2022-08-20 DIAGNOSIS — G9341 Metabolic encephalopathy: Secondary | ICD-10-CM | POA: Diagnosis present

## 2022-08-20 DIAGNOSIS — E860 Dehydration: Secondary | ICD-10-CM | POA: Diagnosis present

## 2022-08-20 DIAGNOSIS — I130 Hypertensive heart and chronic kidney disease with heart failure and stage 1 through stage 4 chronic kidney disease, or unspecified chronic kidney disease: Secondary | ICD-10-CM | POA: Diagnosis present

## 2022-08-20 DIAGNOSIS — R479 Unspecified speech disturbances: Secondary | ICD-10-CM

## 2022-08-20 DIAGNOSIS — Z79890 Hormone replacement therapy (postmenopausal): Secondary | ICD-10-CM | POA: Diagnosis not present

## 2022-08-20 DIAGNOSIS — I6521 Occlusion and stenosis of right carotid artery: Secondary | ICD-10-CM | POA: Diagnosis present

## 2022-08-20 DIAGNOSIS — Z794 Long term (current) use of insulin: Secondary | ICD-10-CM

## 2022-08-20 DIAGNOSIS — Z87891 Personal history of nicotine dependence: Secondary | ICD-10-CM

## 2022-08-20 DIAGNOSIS — I672 Cerebral atherosclerosis: Secondary | ICD-10-CM | POA: Diagnosis not present

## 2022-08-20 DIAGNOSIS — I6389 Other cerebral infarction: Principal | ICD-10-CM | POA: Diagnosis present

## 2022-08-20 DIAGNOSIS — E785 Hyperlipidemia, unspecified: Secondary | ICD-10-CM

## 2022-08-20 DIAGNOSIS — E039 Hypothyroidism, unspecified: Secondary | ICD-10-CM | POA: Diagnosis present

## 2022-08-20 DIAGNOSIS — Z8673 Personal history of transient ischemic attack (TIA), and cerebral infarction without residual deficits: Secondary | ICD-10-CM

## 2022-08-20 DIAGNOSIS — I6523 Occlusion and stenosis of bilateral carotid arteries: Secondary | ICD-10-CM | POA: Diagnosis not present

## 2022-08-20 DIAGNOSIS — N179 Acute kidney failure, unspecified: Secondary | ICD-10-CM | POA: Diagnosis present

## 2022-08-20 DIAGNOSIS — Z7902 Long term (current) use of antithrombotics/antiplatelets: Secondary | ICD-10-CM

## 2022-08-20 DIAGNOSIS — Z888 Allergy status to other drugs, medicaments and biological substances status: Secondary | ICD-10-CM

## 2022-08-20 DIAGNOSIS — I10 Essential (primary) hypertension: Secondary | ICD-10-CM | POA: Diagnosis present

## 2022-08-20 DIAGNOSIS — E1129 Type 2 diabetes mellitus with other diabetic kidney complication: Secondary | ICD-10-CM | POA: Diagnosis present

## 2022-08-20 DIAGNOSIS — I6502 Occlusion and stenosis of left vertebral artery: Secondary | ICD-10-CM | POA: Diagnosis present

## 2022-08-20 DIAGNOSIS — I5032 Chronic diastolic (congestive) heart failure: Secondary | ICD-10-CM | POA: Diagnosis present

## 2022-08-20 DIAGNOSIS — N400 Enlarged prostate without lower urinary tract symptoms: Secondary | ICD-10-CM | POA: Diagnosis present

## 2022-08-20 DIAGNOSIS — I6310 Cerebral infarction due to embolism of unspecified precerebral artery: Secondary | ICD-10-CM

## 2022-08-20 DIAGNOSIS — R4781 Slurred speech: Secondary | ICD-10-CM | POA: Diagnosis not present

## 2022-08-20 DIAGNOSIS — G459 Transient cerebral ischemic attack, unspecified: Secondary | ICD-10-CM

## 2022-08-20 DIAGNOSIS — Z79899 Other long term (current) drug therapy: Secondary | ICD-10-CM

## 2022-08-20 DIAGNOSIS — Z833 Family history of diabetes mellitus: Secondary | ICD-10-CM

## 2022-08-20 DIAGNOSIS — Z8249 Family history of ischemic heart disease and other diseases of the circulatory system: Secondary | ICD-10-CM | POA: Diagnosis not present

## 2022-08-20 LAB — COMPREHENSIVE METABOLIC PANEL
ALT: 28 U/L (ref 0–44)
AST: 24 U/L (ref 15–41)
Albumin: 3.3 g/dL — ABNORMAL LOW (ref 3.5–5.0)
Alkaline Phosphatase: 95 U/L (ref 38–126)
Anion gap: 10 (ref 5–15)
BUN: 35 mg/dL — ABNORMAL HIGH (ref 8–23)
CO2: 22 mmol/L (ref 22–32)
Calcium: 8.7 mg/dL — ABNORMAL LOW (ref 8.9–10.3)
Chloride: 103 mmol/L (ref 98–111)
Creatinine, Ser: 2.09 mg/dL — ABNORMAL HIGH (ref 0.61–1.24)
GFR, Estimated: 31 mL/min — ABNORMAL LOW (ref >60)
Glucose, Bld: 256 mg/dL — ABNORMAL HIGH (ref 70–99)
Potassium: 4.1 mmol/L (ref 3.5–5.1)
Sodium: 135 mmol/L (ref 135–145)
Total Bilirubin: 0.8 mg/dL (ref 0.3–1.2)
Total Protein: 6.4 g/dL — ABNORMAL LOW (ref 6.5–8.1)

## 2022-08-20 LAB — CBG MONITORING, ED: Glucose-Capillary: 221 mg/dL — ABNORMAL HIGH (ref 70–99)

## 2022-08-20 LAB — DIFFERENTIAL
Abs Immature Granulocytes: 0.01 10*3/uL (ref 0.00–0.07)
Basophils Absolute: 0 10*3/uL (ref 0.0–0.1)
Basophils Relative: 1 %
Eosinophils Absolute: 0.2 10*3/uL (ref 0.0–0.5)
Eosinophils Relative: 3 %
Immature Granulocytes: 0 %
Lymphocytes Relative: 20 %
Lymphs Abs: 1.4 10*3/uL (ref 0.7–4.0)
Monocytes Absolute: 0.6 10*3/uL (ref 0.1–1.0)
Monocytes Relative: 9 %
Neutro Abs: 4.6 10*3/uL (ref 1.7–7.7)
Neutrophils Relative %: 67 %

## 2022-08-20 LAB — CBC
HCT: 31.8 % — ABNORMAL LOW (ref 39.0–52.0)
Hemoglobin: 10.3 g/dL — ABNORMAL LOW (ref 13.0–17.0)
MCH: 29.8 pg (ref 26.0–34.0)
MCHC: 32.4 g/dL (ref 30.0–36.0)
MCV: 91.9 fL (ref 80.0–100.0)
Platelets: 188 10*3/uL (ref 150–400)
RBC: 3.46 MIL/uL — ABNORMAL LOW (ref 4.22–5.81)
RDW: 15.1 % (ref 11.5–15.5)
WBC: 6.8 10*3/uL (ref 4.0–10.5)
nRBC: 0 % (ref 0.0–0.2)

## 2022-08-20 LAB — ETHANOL: Alcohol, Ethyl (B): <10 mg/dL (ref <10)

## 2022-08-20 LAB — TSH: TSH: 1.51 u[IU]/mL (ref 0.350–4.500)

## 2022-08-20 LAB — PROTIME-INR
INR: 1.2 (ref 0.8–1.2)
Prothrombin Time: 15.1 seconds (ref 11.4–15.2)

## 2022-08-20 LAB — BRAIN NATRIURETIC PEPTIDE: B Natriuretic Peptide: 439.8 pg/mL — ABNORMAL HIGH (ref 0.0–100.0)

## 2022-08-20 LAB — GLUCOSE, CAPILLARY: Glucose-Capillary: 237 mg/dL — ABNORMAL HIGH (ref 70–99)

## 2022-08-20 LAB — APTT: aPTT: 33 seconds (ref 24–36)

## 2022-08-20 MED ORDER — STROKE: EARLY STAGES OF RECOVERY BOOK: Freq: Once | Status: AC

## 2022-08-20 MED ORDER — SODIUM CHLORIDE 0.9 % IV BOLUS
500.0000 mL | Freq: Once | INTRAVENOUS | Status: AC
  Administered 2022-08-20: 500 mL via INTRAVENOUS

## 2022-08-20 MED ORDER — INSULIN ASPART 100 UNIT/ML IJ SOLN
0.0000 [IU] | Freq: Every day | INTRAMUSCULAR | Status: DC
  Administered 2022-08-21: 2 [IU] via SUBCUTANEOUS
  Filled 2022-08-20: qty 1

## 2022-08-20 MED ORDER — SODIUM CHLORIDE 0.9% FLUSH
3.0000 mL | Freq: Once | INTRAVENOUS | Status: AC
  Administered 2022-08-20: 3 mL via INTRAVENOUS

## 2022-08-20 MED ORDER — DIPHENHYDRAMINE HCL 50 MG/ML IJ SOLN: 12.5000 mg | Freq: Three times a day (TID) | INTRAMUSCULAR | Status: DC | PRN

## 2022-08-20 MED ORDER — ENOXAPARIN SODIUM 40 MG/0.4ML IJ SOSY
40.0000 mg | PREFILLED_SYRINGE | INTRAMUSCULAR | Status: DC
  Administered 2022-08-21: 40 mg via SUBCUTANEOUS
  Filled 2022-08-20: qty 0.4

## 2022-08-20 MED ORDER — LORAZEPAM 2 MG/ML IJ SOLN: 2.0000 mg | INTRAMUSCULAR | Status: DC | PRN

## 2022-08-20 MED ORDER — INSULIN ASPART 100 UNIT/ML IJ SOLN
0.0000 [IU] | Freq: Three times a day (TID) | INTRAMUSCULAR | Status: DC
  Administered 2022-08-21 (×2): 2 [IU] via SUBCUTANEOUS
  Administered 2022-08-21: 1 [IU] via SUBCUTANEOUS
  Administered 2022-08-22: 3 [IU] via SUBCUTANEOUS
  Administered 2022-08-22: 2 [IU] via SUBCUTANEOUS
  Administered 2022-08-23: 1 [IU] via SUBCUTANEOUS
  Filled 2022-08-20 (×6): qty 1

## 2022-08-20 MED ORDER — IOHEXOL 350 MG/ML SOLN
75.0000 mL | Freq: Once | INTRAVENOUS | Status: AC | PRN
  Administered 2022-08-20: 75 mL via INTRAVENOUS

## 2022-08-20 MED ORDER — ACETAMINOPHEN 160 MG/5ML PO SOLN: 650.0000 mg | ORAL | Status: DC | PRN

## 2022-08-20 MED ORDER — ACETAMINOPHEN 325 MG PO TABS
650.0000 mg | ORAL_TABLET | ORAL | Status: DC | PRN
  Administered 2022-08-22: 650 mg via ORAL
  Filled 2022-08-20: qty 2

## 2022-08-20 MED ORDER — ACETAMINOPHEN 650 MG RE SUPP: 650.0000 mg | RECTAL | Status: DC | PRN

## 2022-08-20 MED ORDER — HYDRALAZINE HCL 20 MG/ML IJ SOLN: 5.0000 mg | INTRAMUSCULAR | Status: DC | PRN

## 2022-08-20 MED ORDER — SENNOSIDES-DOCUSATE SODIUM 8.6-50 MG PO TABS: 1.0000 | ORAL_TABLET | Freq: Every evening | ORAL | Status: DC | PRN

## 2022-08-20 NOTE — Progress Notes (Signed)
   08/20/22 1900  Spiritual Encounters  Type of Visit Initial  Care provided to: Pt not available  Referral source Code page  Reason for visit Code  OnCall Visit Yes   Chaplain met EMT team and patient as the ED entrance. No family present at the time. Chaplain services available if needed.

## 2022-08-20 NOTE — ED Notes (Signed)
Report received from [facility], [place]. Neurologist updated pt and family on poc.

## 2022-08-20 NOTE — ED Provider Notes (Signed)
[facility] Provider Note    Event Date/Time   First MD Initiated Contact with Patient 08/20/22 1957     (approximate)   History   Extremity Weakness   HPI  Dylan Robles is a 82 y.o. male presents to the emergency department today via EMS as a code stroke.  Patient had complaints of right sided weakness and difficulty speaking.  This was noticed by family at home.  However her symptoms had resolved by the time the patient arrived to the emergency department.  He denies similar symptoms in the past.  He denies any recent illness.     Physical Exam   Triage Vital Signs: ED Triage Vitals  Encounter Vitals Group     BP 08/20/22 1955 (!) 173/74     Systolic BP Percentile --      Diastolic BP Percentile --      Pulse Rate 08/20/22 1955 64     Resp 08/20/22 1955 14     Temp 08/20/22 1955 98.9 F (37.2 C)     Temp Source 08/20/22 1955 Oral     SpO2 08/20/22 1955 97 %     Weight 08/20/22 1956 243 lb 2.7 oz (110.3 kg)     Height 08/20/22 1956 6\' 6"  (1.981 m)     Head Circumference --      Peak Flow --      Pain Score 08/20/22 2001 0     Pain Loc --      Pain Education --      Exclude from Growth Chart --     Most recent vital signs: Vitals:   08/20/22 1959 08/20/22 2000  BP:  (!) 168/82  Pulse:  69  Resp:  17  Temp:    SpO2: 98% 97%   General: Awake, alert, oriented save for event. CV:  Good peripheral perfusion. Irregular rhythm Resp:  Normal effort. Lungs clear. Abd:  No distention.   ED Results / Procedures / Treatments   Labs (all labs ordered are listed, but only abnormal results are displayed) Labs Reviewed  CBC - Abnormal; Notable for the following components:      Result Value   RBC 3.46 (*)    Hemoglobin 10.3 (*)    HCT 31.8 (*)    All other components within normal limits  COMPREHENSIVE METABOLIC PANEL - Abnormal; Notable for the following components:   Glucose, Bld 256 (*)    BUN 35 (*)    Creatinine, Ser 2.09 (*)     Calcium 8.7 (*)    Total Protein 6.4 (*)    Albumin 3.3 (*)    GFR, Estimated 31 (*)    All other components within normal limits  CBG MONITORING, ED - Abnormal; Notable for the following components:   Glucose-Capillary 221 (*)    All other components within normal limits  PROTIME-INR  APTT  DIFFERENTIAL  ETHANOL     EKG  I, Phineas Semen, attending physician, personally viewed and interpreted this EKG  EKG Time: 1954 Rate: 71 Rhythm: atrial fibrillation Axis: normal Intervals: qtc 588 QRS: narrow ST changes: no st elevation Impression: abnormal ekg   RADIOLOGY I independently interpreted and visualized the CT head. My interpretation: No bleed Radiology interpretation:  IMPRESSION:  1. No acute intracranial abnormality.  2. ASPECTS is 10.      PROCEDURES:  Critical Care performed: No  MEDICATIONS ORDERED IN ED: Medications  sodium chloride flush (NS) 0.9 % injection 3 mL (has no administration  in time range)  iohexol (OMNIPAQUE) 350 MG/ML injection 75 mL (75 mLs Intravenous Contrast Given 08/20/22 1945)     IMPRESSION / MDM / ASSESSMENT AND PLAN / ED COURSE  I reviewed the triage vital signs and the nursing notes.                              Differential diagnosis includes, but is not limited to, TIA, seizure, syncope  Patient's presentation is most consistent with acute presentation with potential threat to life or bodily function.   The patient is on the cardiac monitor to evaluate for evidence of arrhythmia and/or significant heart rate changes.  Patient presented to the emergency department today because of concerns for episode of weakness and speech difficulty.  Symptoms had resolved by the time my examination.  Patient did arrive as a code stroke and Dr. Jerrell Belfast with neurology evaluated.  Head CT without concerning findings.  At this time he recommends admission for TIA workup, would also consider possible seizure type episode. EKG today is  concerning for possible afib, which is not on patient's list of medical problems. Discussed with Dr. Clyde Lundborg with the hospitalist service who will evaluate for admission.    FINAL CLINICAL IMPRESSION(S) / ED DIAGNOSES   Final diagnoses:  Atrial fibrillation, unspecified type (HCC)  Difficulty with speech     Note:  This document was prepared using Dragon voice recognition software and may include unintentional dictation errors.    Phineas Semen, MD 08/21/22 [phone]

## 2022-08-20 NOTE — Progress Notes (Addendum)
Telestroke Note  1926: Code stroke cart activated at this time. Patient arrived via EMS after wife witnessed patient experience a sudden onset "coughing spell" that preceded a period of unresponsiveness and inability to follow commands. Wife stated that patient had a blank stare and was looking around but was unable to speak. Upon EMS arrival, speech changes were noted by wife and patient as patient stated he was experiencing difficulty with word finding. LKW 1800. mRS 1. Patient lives at home, is at baseline alert and oriented x4, and is independent.   1927: Dr. Wilford Corner on camera at this time. Patient history and report provided to Dr. Wilford Corner by EMS.   1928: Dr. Wilford Corner performing neuro assessment at this time.   1930: Patient transported to CT.   1941: Dr. Wilford Corner read CT imaging at this time. CTA to be obtained.   1948: Patient returned to room from CT.   1949: Dr. Wilford Corner speaking with patient's wife at this time. Dr. Wilford Corner discussing CT imaging results with wife.    Derrill Kay Telestroke RN

## 2022-08-20 NOTE — H&P (Signed)
History and Physical    Dylan Robles Dylan Robles:[id number] DOB: [birth year] DOA: 08/20/2022  Referring MD/NP/PA:   PCP: Billie Lade, MD   Patient coming from:  The patient is coming from home.     Chief Complaint: AMS, confusion, right-sided weakness, difficulty speaking  HPI: Dylan Robles is a 82 y.o. male with medical history significant of CAD, HTN, HLD, CAD, dCHF, hypothyroidism, BPH, CKD-3A, peripheral neuropathy, former smoker, who presents with AMS, confusion, right-sided weakness, difficulty speaking.  Per his wife at bedside, around 6:00 PM, she heard that patient had coughing spell, then pt suddenly stopped coughing, and started staring in space with confusion. No seizure activity noted.  Patient has difficulty speaking, and right-sided weakness which has resolved at arrival to ED. When I saw patient in ED, he still has some difficulty speaking, no unilateral numbness or tingling in extremities. Patient has decreased sensation in right leg. No facial droop, vision loss or hearing loss.  Patient is mildly confused, not oriented to time, but is still orientated to the person and place.  Following commands. Patient has mild shortness of breath, no active cough currently, denies chest pain.  No fever or chills.  Patient does not have nausea, vomiting, diarrhea or abdominal pain.  No symptoms of UTI.  No rectal bleeding or dark stool.  Data reviewed independently and ED Course: pt was found to have WBC 6.8, INR 1.2, PTT 33, worsening renal function with creatinine 2.09, BUN 25 and GFR 31 (recent baseline creatinine 1.42 on 05/10/2022), temperature normal, blood pressure 168/82, heart rate 69, RR 17, oxygen saturation 97% on room air.  CT of head is negative.  CTA of head and neck negative for LVO.  Patient is placed on head of bed for obs.  Dr. Wilford Corner of neurology and Dr. Juliann Pares of card are consulted.  CTA of head and neck: 1. No emergent large vessel occlusion. 2. Severe stenosis of the left  vertebral artery V4 segment over a length of approximately 5 mm. 3. A 50% stenosis of the proximal right internal carotid artery secondary to mixed density atherosclerosis.   Aortic Atherosclerosis (ICD10-I70.0).   EKG: I have personally reviewed.  New onset A-fib, QTc 588, nonspecific T wave change, heart rate 71   Review of Systems:   General: no fevers, chills, no body weight gain, has fatigue HEENT: no blurry vision, hearing changes or sore throat Respiratory: has mild dyspnea, coughing, no wheezing CV: no chest pain, no palpitations GI: no nausea, vomiting, abdominal pain, diarrhea, constipation GU: no dysuria, burning on urination, increased urinary frequency, hematuria  Ext: has leg edema Neuro: no vision change or hearing loss.  Has confusion, right-sided weakness, difficulty speaking Skin: no rash, no skin tear. MSK: No muscle spasm, no deformity, no limitation of range of movement in spin Heme: No easy bruising.  Travel history: No recent long distant travel.   Allergy:  Allergies  Allergen Reactions   Plavix [Clopidogrel] Hives    Past Medical History:  Diagnosis Date   Acute lower UTI 09/20/2017   Bradycardia on ECG 07/31/2011   Coronary artery disease    hyperlipidemia   Decreased GFR 09/23/2019   Dehydration 09/21/2017   Diabetes mellitus    Diabetes mellitus without complication (HCC)    Hypercholesterolemia    Hypertension    Hypothyroidism    Left arm pain 01/29/2020   Paronychia of great toe of left foot 09/26/2018   Paronychia of great toe, left 09/26/2018   PERIPHERAL NEUROPATHY 01/13/2006  Qualifier: Diagnosis of  By: Jen Mow MD, Christine     Shortness of breath dyspnea    with exertion   Vasovagal syncope    Weakness 09/20/2017    Past Surgical History:  Procedure Laterality Date   APPENDECTOMY     CARDIAC CATHETERIZATION N/A 06/16/2014   Procedure: Left Heart Cath;  Surgeon: Laurier Nancy, MD;  Location: [facility] INVASIVE CV [facility];  Service:  Cardiovascular;  Laterality: N/A;   CIRCUMCISION, NON-NEWBORN     HERNIA REPAIR     LOWER EXTREMITY ANGIOGRAPHY Left 12/24/2018   Procedure: LOWER EXTREMITY ANGIOGRAPHY;  Surgeon: Renford Dills, MD;  Location: [facility] INVASIVE CV LAB;  Service: Cardiovascular;  Laterality: Left;   THYROID SURGERY      Social History:  reports that he has quit smoking. He has never used smokeless tobacco. He reports that he does not drink alcohol and does not use drugs.  Family History:  Family History  Problem Relation Age of Onset   Diabetes Father    Hypertension Father    Cancer Mother    Diabetes Brother      Prior to Admission medications   Medication Sig Start Date End Date Taking? Authorizing Provider  aspirin EC 81 MG tablet Take 81 mg by mouth daily.     [provider]  atorvastatin (LIPITOR) 80 MG tablet TAKE 1 TABLET AT BEDTIME. 07/16/20   [name], NP  BRILINTA 60 MG TABS tablet TAKE 1 TABLET TWICE DAILY 04/06/22   Laurier Nancy, MD  cetirizine (ZYRTEC) 5 MG tablet Take 1 tablet (5 mg total) by mouth daily. 06/14/22   Billie Lade, MD  Continuous Blood Gluc Sensor (FREESTYLE LIBRE 2 SENSOR) MISC Use to check glucose at least 4 times daily 10/05/21   [name], NP  ezetimibe (ZETIA) 10 MG tablet TAKE 1 TABLET EVERY DAY 06/21/22   Laurier Nancy, MD  Fish Oil-Vitamin D 1000-1000 MG-UNIT CAPS Take by mouth once. daily    [provider]  furosemide (LASIX) 20 MG tablet Take 20 mg by mouth as needed.    [provider]  gabapentin (NEURONTIN) 300 MG capsule Take 1 capsule (300 mg total) by mouth 3 (three) times daily. 06/14/22   Billie Lade, MD  Glucagon (GVOKE HYPOPEN 2-PACK) 1 MG/0.2ML SOAJ Inject 1 mg into the skin daily as needed. 06/17/21   Paseda, Baird Kay, FNP  insulin aspart (NOVOLOG FLEXPEN) 100 UNIT/ML FlexPen Inject 10-16 Units into the skin 3 (three) times daily with meals. 05/19/22   [name], NP  insulin degludec  (TRESIBA FLEXTOUCH) 100 UNIT/ML FlexTouch Pen Inject 16 Units into the skin at bedtime. 04/20/22   [name], NP  isosorbide mononitrate (IMDUR) 30 MG 24 hr tablet TAKE 1 TABLET EVERY DAY 06/07/22   Billie Lade, MD  isosorbide mononitrate (IMDUR) 60 MG 24 hr tablet Take 1 tablet by mouth once daily 08/11/22   Billie Lade, MD  levothyroxine (SYNTHROID) 88 MCG tablet TAKE 1 TABLET EVERY DAY BEFORE BREAKFAST 05/08/22   Billie Lade, MD  metoprolol succinate (TOPROL-XL) 50 MG 24 hr tablet TAKE 1 TABLET EVERY DAY 07/12/22   Laurier Nancy, MD  spironolactone (ALDACTONE) 25 MG tablet Take 1 tablet (25 mg total) by mouth daily. 05/09/22 05/09/23  Laurier Nancy, MD  tamsulosin (FLOMAX) 0.4 MG CAPS capsule Take 1 capsule (0.4 mg total) by mouth at bedtime. 06/14/22   Billie Lade, MD  triamcinolone cream (  KENALOG) 0.1 % Apply 1 Application topically 2 (two) times daily. 06/14/22   Billie Lade, MD    Physical Exam: Vitals:   08/20/22 1959 08/20/22 2000 08/20/22 2143 08/20/22 2217  BP:  (!) 168/82 (!) 178/88 (!) 179/86  Pulse:  69 65 66  Resp:  17 18 16   Temp:   98.6 F (37 C) 98.3 F (36.8 C)  TempSrc:      SpO2: 98% 97% 96% 94%  Weight:    107.6 kg  Height:    6\' 6"  (1.981 m)   General: Not in acute distress HEENT:       Eyes: PERRL, EOMI, no jaundice       ENT: No discharge from the ears and nose, no pharynx injection, no tonsillar enlargement.        Neck: No JVD, no bruit, no mass felt. Heme: No neck lymph node enlargement. Cardiac: S1/S2, irregularly irregular rhythm, no murmurs, No gallops or rubs. Respiratory: No rales, wheezing, rhonchi or rubs. GI: Soft, nondistended, nontender, no rebound pain, no organomegaly, BS present. GU: No hematuria Ext: has trace leg edema bilaterally.  Has chronic venous insufficiency change in both legs.  1+DP/PT pulse bilaterally. Musculoskeletal: No joint deformities, No joint redness or warmth, no limitation of ROM in spin. Skin:  No rashes.  Neuro: Mildly confused, orientated to place and person, not orientated to time, Cranial nerves II-XII grossly intact, Muscle strength 5/5 in all extremities, sensation to light touch is decreased in right arm and leg. Psych: Patient is not psychotic, no suicidal or hemocidal ideation.  Labs on Admission: I have personally reviewed following labs and imaging studies  CBC: Recent Labs  Lab 08/20/22 1955  WBC 6.8  NEUTROABS 4.6  HGB 10.3*  HCT 31.8*  MCV 91.9  PLT 188   Basic Metabolic Panel: Recent Labs  Lab 08/20/22 1955  NA 135  K 4.1  CL 103  CO2 22  GLUCOSE 256*  BUN 35*  CREATININE 2.09*  CALCIUM 8.7*   GFR: Estimated Creatinine Clearance: 34 mL/min (A) (by C-G formula based on SCr of 2.09 mg/dL (H)). Liver Function Tests: Recent Labs  Lab 08/20/22 1955  AST 24  ALT 28  ALKPHOS 95  BILITOT 0.8  PROT 6.4*  ALBUMIN 3.3*   No results for input(s): "LIPASE", "AMYLASE" in the last 168 hours. No results for input(s): "AMMONIA" in the last 168 hours. Coagulation Profile: Recent Labs  Lab 08/20/22 1955  INR 1.2   Cardiac Enzymes: No results for input(s): "CKTOTAL", "CKMB", "CKMBINDEX", "TROPONINI" in the last 168 hours. BNP (last 3 results) No results for input(s): "PROBNP" in the last 8760 hours. HbA1C: No results for input(s): "HGBA1C" in the last 72 hours. CBG: Recent Labs  Lab 08/20/22 1928 08/20/22 2221  GLUCAP 221* 237*   Lipid Profile: No results for input(s): "CHOL", "HDL", "LDLCALC", "TRIG", "CHOLHDL", "LDLDIRECT" in the last 72 hours. Thyroid Function Tests: Recent Labs    08/20/22 1953  TSH 1.510   Anemia Panel: No results for input(s): "VITAMINB12", "FOLATE", "FERRITIN", "TIBC", "IRON", "RETICCTPCT" in the last 72 hours. Urine analysis:    Component Value Date/Time   COLORURINE YELLOW 06/09/2020 1142   APPEARANCEUR SLIGHTLY CLOUDY (A) 06/09/2020 1142   LABSPEC 1.015 06/09/2020 1142   PHURINE 7.0 06/09/2020 1142    GLUCOSEU 2+ (A) 06/09/2020 1142   HGBUR NEGATIVE 06/09/2020 1142   BILIRUBINUR NEGATIVE 09/20/2017 1917   KETONESUR NEGATIVE 06/09/2020 1142   PROTEINUR NEGATIVE 06/09/2020 1142   UROBILINOGEN 0.2  07/28/2011 1351   NITRITE NEGATIVE 06/09/2020 1142   LEUKOCYTESUR NEGATIVE 06/09/2020 1142   Sepsis Labs: @LABRCNTIP (procalcitonin:4,lacticidven:4) )No results found for this or any previous visit (from the past 240 hour(s)).   Radiological Exams on Admission: MR BRAIN WO CONTRAST  Result Date: 08/20/2022 CLINICAL DATA:  Transient ischemic attack EXAM: MRI HEAD WITHOUT CONTRAST TECHNIQUE: Multiplanar, multiecho pulse sequences of the brain and surrounding structures were obtained without intravenous contrast. COMPARISON:  None Available. FINDINGS: Brain: Small focus of acute ischemia at posterior left insula. No acute or chronic hemorrhage. There is multifocal hyperintense T2-weighted signal within the white matter. Generalized volume loss. Multiple old small vessel infarcts of the deep white matter. The midline structures are normal. Vascular: Major flow voids are preserved. Skull and upper cervical spine: Normal calvarium and skull base. Visualized upper cervical spine and soft tissues are normal. Sinuses/Orbits:No paranasal sinus fluid levels or advanced mucosal thickening. No mastoid or middle ear effusion. Normal orbits. IMPRESSION: 1. Small focus of acute ischemia at posterior left insula. No hemorrhage or mass effect. 2. Multiple old small vessel infarcts of the deep white matter. Electronically Signed   By: Deatra Robinson M.D.   On: 08/20/2022 22:21   CT ANGIO HEAD NECK W WO CM (CODE STROKE)  Result Date: 08/20/2022 CLINICAL DATA:  Stroke follow-up EXAM: CT ANGIOGRAPHY HEAD AND NECK WITH AND WITHOUT CONTRAST TECHNIQUE: Multidetector CT imaging of the head and neck was performed using the standard protocol during bolus administration of intravenous contrast. Multiplanar CT image reconstructions  and MIPs were obtained to evaluate the vascular anatomy. Carotid stenosis measurements (when applicable) are obtained utilizing NASCET criteria, using the distal internal carotid diameter as the denominator. RADIATION DOSE REDUCTION: This exam was performed according to the departmental dose-optimization program which includes automated exposure control, adjustment of the mA and/or kV according to patient size and/or use of iterative reconstruction technique. CONTRAST:  75mL OMNIPAQUE IOHEXOL 350 MG/ML SOLN COMPARISON:  08/20/2022 FINDINGS: CTA NECK FINDINGS SKELETON: There is no bony spinal canal stenosis. No lytic or blastic lesion. OTHER NECK: Normal pharynx, larynx and major salivary glands. No cervical lymphadenopathy. Unremarkable thyroid gland. UPPER CHEST: No pneumothorax or pleural effusion. No nodules or masses. AORTIC ARCH: There is calcific atherosclerosis of the aortic arch. Conventional 3 vessel aortic branching pattern. RIGHT CAROTID SYSTEM: No dissection, occlusion or aneurysm. There is mixed density atherosclerosis extending into the proximal ICA, resulting in 50% stenosis. LEFT CAROTID SYSTEM: No dissection, occlusion or aneurysm. Mild atherosclerotic calcification at the carotid bifurcation without hemodynamically significant stenosis. VERTEBRAL ARTERIES: Right dominant configuration.There is no dissection, occlusion or flow-limiting stenosis to the skull base (V1-V3 segments). CTA HEAD FINDINGS POSTERIOR CIRCULATION: --Vertebral arteries: Severe stenosis of the left V4 segment over length of approximately 5 mm (series 8 image 151. Mild atherosclerosis of the right V4 segment without stenosis. --Inferior cerebellar arteries: Normal. --Basilar artery: Diminutive basilar artery --Superior cerebellar arteries: Normal. --Posterior cerebral arteries (PCA): Normal. ANTERIOR CIRCULATION: --Intracranial internal carotid arteries: Normal. --Anterior cerebral arteries (ACA): Normal. Both A1 segments are  present. Patent anterior communicating artery (a-comm). --Middle cerebral arteries (MCA): Normal. VENOUS SINUSES: As permitted by contrast timing, patent. ANATOMIC VARIANTS: None Review of the MIP images confirms the above findings. IMPRESSION: 1. No emergent large vessel occlusion. 2. Severe stenosis of the left vertebral artery V4 segment over a length of approximately 5 mm. 3. A 50% stenosis of the proximal right internal carotid artery secondary to mixed density atherosclerosis. Aortic Atherosclerosis (ICD10-I70.0). Electronically Signed   By: Caryn Bee  Chase Picket M.D.   On: 08/20/2022 20:03   CT HEAD CODE STROKE WO CONTRAST  Result Date: 08/20/2022 CLINICAL DATA:  Code stroke.  Slurred speech EXAM: CT HEAD WITHOUT CONTRAST TECHNIQUE: Contiguous axial images were obtained from the base of the skull through the vertex without intravenous contrast. RADIATION DOSE REDUCTION: This exam was performed according to the departmental dose-optimization program which includes automated exposure control, adjustment of the mA and/or kV according to patient size and/or use of iterative reconstruction technique. COMPARISON:  None Available. FINDINGS: Brain: There is no mass, hemorrhage or extra-axial collection. There is generalized atrophy without lobar predilection. There is hypoattenuation of the periventricular white matter, most commonly indicating chronic ischemic microangiopathy. Old right basal ganglia small vessel infarct. Vascular: Atherosclerotic calcification of the vertebral and internal carotid arteries at the skull base. No abnormal hyperdensity of the major intracranial arteries or dural venous sinuses. Skull: The visualized skull base, calvarium and extracranial soft tissues are normal. Sinuses/Orbits: No fluid levels or advanced mucosal thickening of the visualized paranasal sinuses. No mastoid or middle ear effusion. The orbits are normal. ASPECTS [facility] Stroke Program Early CT Score) - Ganglionic level  infarction (caudate, lentiform nuclei, internal capsule, insula, M1-M3 cortex): 7 - Supraganglionic infarction (M4-M6 cortex): 3 Total score (0-10 with 10 being normal): 10 IMPRESSION: 1. No acute intracranial abnormality. 2. ASPECTS is 10. These results were called by telephone at the time of interpretation on 08/20/2022 at 7:44 pm to provider [facility] , who verbally acknowledged these results. Electronically Signed   By: Deatra Robinson M.D.   On: 08/20/2022 19:44      Assessment/Plan Principal Problem:   TIA (transient ischemic attack) Active Problems:   New onset atrial fibrillation (HCC)   Chronic diastolic CHF (congestive heart failure) (HCC)   Acute metabolic encephalopathy   Hypothyroidism   Essential hypertension   Hyperlipidemia   Type II diabetes mellitus with renal manifestations (HCC)   Acute renal failure superimposed on stage 3a chronic kidney disease (HCC)   BPH (benign prostatic hyperplasia)   CAD (coronary artery disease)   Assessment and Plan:  TIA (transient ischemic attack): Patient's symptoms are concerning for TIA.  Other differential diagnosis include seizure.  CT of head is negative for acute intracranial abnormalities.  Patient is found to have new onset atrial fibrillation which is likely the etiology. CTA of head and neck negative for LVO.  Consulted Dr. Wilford Corner of neurology.   -Placed on tele med bed for observation - Obtain MRI-brain  - will hold oral Bp meds to allow permissive HTN  - will continue home ASA and Brilinta  - Statin: lipitor - fasting lipid panel and HbA1c  - 2D transthoracic echocardiography  - swallowing screen. If fails, will get SLP - PT/OT consult - EEG -Seizure precaution -As needed Ativan for seizure -Check orthostatic vital sign per Dr. Wilford Corner  New onset atrial fibrillation (HCC): HR is 70s.  CHADS2 score is 8, will need chronic anticoagulants. -continue home metoprolol -Check TSH - will not start anticoagulants in the  setting for acute TIA, cannot completely rule out small stroke, to avoid hemorrhagic conversion. -Consulted Dr. Juliann Pares of cardiology  Chronic diastolic CHF (congestive heart failure) (HCC): 2D echo on 03/25/2020 showed EF 68.5% with grade 1 diastolic dysfunction.  Patient has mild shortness of breath and trace leg edema, no oxygen desaturation, no JVD, clinically does not seem to have CHF exacerbation. -Hold diuretics: Lasix and spironolactone due to worsening renal function -check BNP  Acute metabolic encephalopathy: Most  likely due to TIA -Fall precaution -Frequent neurocheck -Follow-up urinalysis  Hypothyroidism -Synthroid -Follow-up TSH  Essential hypertension - prn IV hydralazine for SBP>220 or dBP>110 -Hold Lasix, spironolactone, Imdur -Continue metoprolol for rate control of A-fib  Hyperlipidemia -Lipitor and Zetia  Type II diabetes mellitus with renal manifestations [facility]): Recent A1c 10.2, poorly controlled.  Patient is taking NovoLog and Tresiba 16 unit daily -Sliding scale insulin -Glargine insulin 12 units daily   Acute renal failure superimposed on stage 3a chronic kidney disease (HCC): Likely due to dehydration and continuation for diuretics -Hold Lasix and spironolactone -Patient received 500 cc normal saline -check UA  BPH (benign prostatic hyperplasia) -Flomax  CAD: no CP. S/p of PCI, no stent placement per his wife -lipitor -ASA and Brilinta    DVT ppx: SQ Lovenox  Code Status: Full code   Family Communication: Yes, patient's wife and brother-in-law   at bed side.     Disposition Plan:  Anticipate discharge back to previous environment  Consults called:   Dr. Wilford Corner of neurology and Dr. Juliann Pares of card are consulted.  Admission status and Level of care: Telemetry Medical:  for obs    Dispo: The patient is from: Home              Anticipated d/c is to: Home              Anticipated d/c date is: 1 day              Patient currently is not  medically stable to d/c.    Severity of Illness:  The appropriate patient status for this patient is OBSERVATION. Observation status is judged to be reasonable and necessary in order to provide the required intensity of service to ensure the patient's safety. The patient's presenting symptoms, physical exam findings, and initial radiographic and laboratory data in the context of their medical condition is felt to place them at decreased risk for further clinical deterioration. Furthermore, it is anticipated that the patient will be medically stable for discharge from the hospital within 2 midnights of admission.        Date of Service 08/21/2022    Lorretta Harp [facility]   If 7PM-7AM, please contact night-coverage [email or web address] 08/21/2022, 1:35 AM

## 2022-08-20 NOTE — ED Notes (Signed)
Upon arrival, telestroke MD and telestroke RN on screen to assess patient. Pt taken to CT after exam by telestroke MD.

## 2022-08-20 NOTE — Consult Note (Addendum)
[facility] Telemedicine Consult   Requesting Provider: Dr Rosalia Hammers  Consult Participants: Dr. Marthe Patch, Telespecialist RN Glenda   Bedside RN Corrie Dandy Location of the provider: Home Location of the patient: [facility] 15A  This consult was provided via telemedicine with 2-way video and audio communication. The patient/family was informed that care would be provided in this way and agreed to receive care in this manner.   Chief Complaint: staring, non responsive, right sided weakness  HPI:  82 year old man past history of coronary artery disease, hyperlipidemia, diabetes, hypertension, prior syncope episodes, presented to the emergency room for evaluation of sudden onset of not following commands, staring and being unresponsive followed by right-sided weakness. Around 6 PM, the wife heard the patient have a coughing spell and he suddenly stopped coughing, and started staring in space.  He stopped talking and did not follow any commands.  Wife called 911.  EMS arrived at their house at around 1830 hrs.  He had complete weakness of the right side with no grip strength on the right hand.  His left side was fully strong.  He did not follow commands consistently.  He was asked to smile but he only open his mouth and did not understand what was being asked of him.  He continued to stare in space.  En route, his symptoms started to improve.  He started to talk some and his right-sided strength started to become better. Code stroke was activated due to sudden onset of focal neurological symptoms.  ROS obtained from wife-reports increasing shortness of breath over the past few months.   Past Medical History:  Diagnosis Date   Acute lower UTI 09/20/2017   Bradycardia on ECG 07/31/2011   Coronary artery disease    hyperlipidemia   Decreased GFR 09/23/2019   Dehydration 09/21/2017   Diabetes mellitus    Diabetes mellitus without complication (HCC)    Hypercholesterolemia    Hypertension     Hypothyroidism    Left arm pain 01/29/2020   Paronychia of great toe of left foot 09/26/2018   Paronychia of great toe, left 09/26/2018   PERIPHERAL NEUROPATHY 01/13/2006   Qualifier: Diagnosis of  By: Jen Mow MD, Christine     Shortness of breath dyspnea    with exertion   Vasovagal syncope    Weakness 09/20/2017     Current Facility-Administered Medications:    sodium chloride flush (NS) 0.9 % injection 3 mL, 3 mL, Intravenous, Once, Ray, Neha, MD  Current Outpatient Medications:    aspirin EC 81 MG tablet, Take 81 mg by mouth daily. , Disp: , Rfl:    atorvastatin (LIPITOR) 80 MG tablet, TAKE 1 TABLET AT BEDTIME., Disp: 90 tablet, Rfl: 1   BRILINTA 60 MG TABS tablet, TAKE 1 TABLET TWICE DAILY, Disp: 180 tablet, Rfl: 3   cetirizine (ZYRTEC) 5 MG tablet, Take 1 tablet (5 mg total) by mouth daily., Disp: 90 tablet, Rfl: 1   Continuous Blood Gluc Sensor (FREESTYLE LIBRE 2 SENSOR) MISC, Use to check glucose at least 4 times daily, Disp: 6 each, Rfl: 3   ezetimibe (ZETIA) 10 MG tablet, TAKE 1 TABLET EVERY DAY, Disp: 90 tablet, Rfl: 1   Fish Oil-Vitamin D 1000-1000 MG-UNIT CAPS, Take by mouth once. daily, Disp: , Rfl:    furosemide (LASIX) 20 MG tablet, Take 20 mg by mouth as needed., Disp: , Rfl:    gabapentin (NEURONTIN) 300 MG capsule, Take 1 capsule (300 mg total) by mouth 3 (three) times daily., Disp: 270 capsule,  Rfl: 1   Glucagon (GVOKE HYPOPEN 2-PACK) 1 MG/0.2ML SOAJ, Inject 1 mg into the skin daily as needed., Disp: 0.4 mL, Rfl: 1   insulin aspart (NOVOLOG FLEXPEN) 100 UNIT/ML FlexPen, Inject 10-16 Units into the skin 3 (three) times daily with meals., Disp: 30 mL, Rfl: 3   insulin degludec (TRESIBA FLEXTOUCH) 100 UNIT/ML FlexTouch Pen, Inject 16 Units into the skin at bedtime., Disp: 15 mL, Rfl: 3   isosorbide mononitrate (IMDUR) 30 MG 24 hr tablet, TAKE 1 TABLET EVERY DAY, Disp: 90 tablet, Rfl: 3   isosorbide mononitrate (IMDUR) 60 MG 24 hr tablet, Take 1 tablet by mouth once daily, Disp:  30 tablet, Rfl: 0   levothyroxine (SYNTHROID) 88 MCG tablet, TAKE 1 TABLET EVERY DAY BEFORE BREAKFAST, Disp: 90 tablet, Rfl: 3   metoprolol succinate (TOPROL-XL) 50 MG 24 hr tablet, TAKE 1 TABLET EVERY DAY, Disp: 90 tablet, Rfl: 3   spironolactone (ALDACTONE) 25 MG tablet, Take 1 tablet (25 mg total) by mouth daily., Disp: 30 tablet, Rfl: 11   tamsulosin (FLOMAX) 0.4 MG CAPS capsule, Take 1 capsule (0.4 mg total) by mouth at bedtime., Disp: 90 capsule, Rfl: 1   triamcinolone cream (KENALOG) 0.1 %, Apply 1 Application topically 2 (two) times daily., Disp: 15 g, Rfl: 1    LKW: 1800 hrs. IV thrombolysis given?: No, resolving symptoms, low NIH IR Thrombectomy? No, symptoms not consistent with the LVO Modified Rankin Scale: 0-Completely asymptomatic and back to baseline post- stroke Time of teleneurologist evaluation: 1727 hrs.  Exam: There were no vitals filed for this visit.  Neurological exam Awake alert oriented to his age, the fact that he is in the hospital, and to the correct month. Speech is not dysarthric No evidence of aphasia.  Able to follow commands. Cranial nerves II to XII appear intact Motor examination with no drift in any of the 4 extremities. Sensation appears intact to light touch without extinction Coordination examination with no dysmetria NIH stroke scale-0  Imaging Reviewed: CT head-no acute changes.  CT angio head and neck no emergent LVO.  Formal read pending.  Labs reviewed in epic and pertinent values follow: CBC    Component Value Date/Time   WBC 6.9 11/10/2021 1346   WBC 15.1 (H) 05/12/2021 2120   RBC 3.94 (L) 11/10/2021 1346   RBC 4.26 05/12/2021 2120   HGB 11.8 (L) 11/10/2021 1346   HCT 36.3 (L) 11/10/2021 1346   PLT 258 11/10/2021 1346   MCV 92 11/10/2021 1346   MCH 29.9 11/10/2021 1346   MCH 29.8 05/12/2021 2120   MCHC 32.5 11/10/2021 1346   MCHC 32.0 05/12/2021 2120   RDW 13.6 11/10/2021 1346   LYMPHSABS 1.6 11/10/2021 1346   MONOABS 0.7  12/31/2020 1930   EOSABS 0.2 11/10/2021 1346   BASOSABS 0.0 11/10/2021 1346   CMP     Component Value Date/Time   NA 139 05/10/2022 0917   K 4.8 05/10/2022 0917   CL 104 05/10/2022 0917   CO2 20 05/10/2022 0917   GLUCOSE 212 (H) 05/10/2022 0917   GLUCOSE 295 (H) 05/12/2021 2120   BUN 14 05/10/2022 0917   CREATININE 1.42 (H) 05/10/2022 0917   CREATININE 1.34 (H) 02/02/2021 0839   CALCIUM 9.6 05/10/2022 0917   PROT 6.0 05/10/2022 0917   ALBUMIN 3.9 05/10/2022 0917   AST 21 05/10/2022 0917   ALT 22 05/10/2022 0917   ALKPHOS 147 (H) 05/10/2022 0917   BILITOT 1.1 05/10/2022 0917   GFRNONAA 44 (L) 05/12/2021 2120  GFRNONAA 54 (L) 04/29/2020 1141   GFRAA 63 04/29/2020 1141     Assessment: 82 year old man past history of coronary artery disease, hypertension, hyperlipidemia and diabetes, prior syncopal episodes presented to the emergency room for evaluation of sudden onset of not following commands staring in space and being unresponsive followed by right-sided weakness and then eventually coming to and exam at the time of arrival in the ER being essentially nonfocal. Differentials: TIA versus seizure.  Recommendations:  Admit to hospitalist Telemetry Frequent neurochecks Routine EEG MRI brain without contrast 2D echo A1c Lipid panel PT OT Speech therapy Allow for permissive hypertension-treat only if systolic blood pressures greater than 220 on a as needed basis. He is on aspirin and Brilinta at home-continue for now. No AEDs for now Maintain seizure precautions Check orthostatic vitals  Neurology will follow with you Plan was discussed with Dr. Dr. Rosalia Hammers   If he has any focal neurological symptoms again, please activate a new code stroke since his clock is reset because his NIH stroke scale at this time is 0 meaning he is asymptomatic.  -- Milon Dikes, MD Neurologist [facility] Pager: [phone]

## 2022-08-20 NOTE — ED Triage Notes (Signed)
Pt BIB [facility] d/t CODE STROKE - right sided weakness and difficulty speaking.   At time of arrival pt resolved all symptoms.  LVO was 5 PTA and now NIHSS is 0

## 2022-08-21 ENCOUNTER — Observation Stay
Admit: 2022-08-21 | Discharge: 2022-08-21 | Disposition: A | Discharge status: Home / Self Care | Payer: Medicare HMO | Attending: Internal Medicine | Admitting: Internal Medicine

## 2022-08-21 ENCOUNTER — Ambulatory Visit: Payer: Medicare HMO

## 2022-08-21 DIAGNOSIS — E1165 Type 2 diabetes mellitus with hyperglycemia: Secondary | ICD-10-CM | POA: Diagnosis present

## 2022-08-21 DIAGNOSIS — I5032 Chronic diastolic (congestive) heart failure: Secondary | ICD-10-CM | POA: Diagnosis present

## 2022-08-21 DIAGNOSIS — N400 Enlarged prostate without lower urinary tract symptoms: Secondary | ICD-10-CM | POA: Diagnosis present

## 2022-08-21 DIAGNOSIS — R569 Unspecified convulsions: Secondary | ICD-10-CM

## 2022-08-21 DIAGNOSIS — I639 Cerebral infarction, unspecified: Secondary | ICD-10-CM | POA: Diagnosis not present

## 2022-08-21 DIAGNOSIS — E7800 Pure hypercholesterolemia, unspecified: Secondary | ICD-10-CM | POA: Diagnosis present

## 2022-08-21 DIAGNOSIS — E039 Hypothyroidism, unspecified: Secondary | ICD-10-CM | POA: Diagnosis present

## 2022-08-21 DIAGNOSIS — E1122 Type 2 diabetes mellitus with diabetic chronic kidney disease: Secondary | ICD-10-CM | POA: Diagnosis present

## 2022-08-21 DIAGNOSIS — I6389 Other cerebral infarction: Secondary | ICD-10-CM | POA: Diagnosis present

## 2022-08-21 DIAGNOSIS — G459 Transient cerebral ischemic attack, unspecified: Secondary | ICD-10-CM | POA: Diagnosis present

## 2022-08-21 DIAGNOSIS — I6502 Occlusion and stenosis of left vertebral artery: Secondary | ICD-10-CM | POA: Diagnosis present

## 2022-08-21 DIAGNOSIS — Z794 Long term (current) use of insulin: Secondary | ICD-10-CM | POA: Diagnosis not present

## 2022-08-21 DIAGNOSIS — I4891 Unspecified atrial fibrillation: Secondary | ICD-10-CM | POA: Diagnosis present

## 2022-08-21 DIAGNOSIS — G9341 Metabolic encephalopathy: Secondary | ICD-10-CM | POA: Diagnosis present

## 2022-08-21 DIAGNOSIS — Z8249 Family history of ischemic heart disease and other diseases of the circulatory system: Secondary | ICD-10-CM | POA: Diagnosis not present

## 2022-08-21 DIAGNOSIS — I700 Atherosclerosis of aorta: Secondary | ICD-10-CM | POA: Diagnosis present

## 2022-08-21 DIAGNOSIS — N179 Acute kidney failure, unspecified: Secondary | ICD-10-CM | POA: Diagnosis present

## 2022-08-21 DIAGNOSIS — R4701 Aphasia: Secondary | ICD-10-CM | POA: Diagnosis present

## 2022-08-21 DIAGNOSIS — R471 Dysarthria and anarthria: Secondary | ICD-10-CM | POA: Diagnosis present

## 2022-08-21 DIAGNOSIS — I2510 Atherosclerotic heart disease of native coronary artery without angina pectoris: Secondary | ICD-10-CM | POA: Diagnosis present

## 2022-08-21 DIAGNOSIS — N1831 Chronic kidney disease, stage 3a: Secondary | ICD-10-CM | POA: Diagnosis present

## 2022-08-21 DIAGNOSIS — Z79890 Hormone replacement therapy (postmenopausal): Secondary | ICD-10-CM | POA: Diagnosis not present

## 2022-08-21 DIAGNOSIS — G8191 Hemiplegia, unspecified affecting right dominant side: Secondary | ICD-10-CM | POA: Diagnosis present

## 2022-08-21 DIAGNOSIS — E860 Dehydration: Secondary | ICD-10-CM | POA: Diagnosis present

## 2022-08-21 DIAGNOSIS — R29700 NIHSS score 0: Secondary | ICD-10-CM | POA: Diagnosis present

## 2022-08-21 DIAGNOSIS — I6521 Occlusion and stenosis of right carotid artery: Secondary | ICD-10-CM | POA: Diagnosis present

## 2022-08-21 DIAGNOSIS — I130 Hypertensive heart and chronic kidney disease with heart failure and stage 1 through stage 4 chronic kidney disease, or unspecified chronic kidney disease: Secondary | ICD-10-CM | POA: Diagnosis present

## 2022-08-21 DIAGNOSIS — I10 Essential (primary) hypertension: Secondary | ICD-10-CM | POA: Diagnosis not present

## 2022-08-21 LAB — HEMOGLOBIN A1C
Hgb A1c MFr Bld: 10.1 % — ABNORMAL HIGH (ref 4.8–5.6)
Mean Plasma Glucose: 243 mg/dL

## 2022-08-21 LAB — GLUCOSE, CAPILLARY
Glucose-Capillary: 124 mg/dL — ABNORMAL HIGH (ref 70–99)
Glucose-Capillary: 158 mg/dL — ABNORMAL HIGH (ref 70–99)
Glucose-Capillary: 163 mg/dL — ABNORMAL HIGH (ref 70–99)
Glucose-Capillary: 148 mg/dL — ABNORMAL HIGH (ref 70–99)

## 2022-08-21 LAB — CBC
HCT: 35 % — ABNORMAL LOW (ref 39.0–52.0)
Hemoglobin: 11.3 g/dL — ABNORMAL LOW (ref 13.0–17.0)
MCH: 29.6 pg (ref 26.0–34.0)
MCHC: 32.3 g/dL (ref 30.0–36.0)
MCV: 91.6 fL (ref 80.0–100.0)
Platelets: 202 10*3/uL (ref 150–400)
RBC: 3.82 MIL/uL — ABNORMAL LOW (ref 4.22–5.81)
RDW: 15 % (ref 11.5–15.5)
WBC: 8.1 10*3/uL (ref 4.0–10.5)
nRBC: 0 % (ref 0.0–0.2)

## 2022-08-21 LAB — PROTIME-INR
INR: 1.1 (ref 0.8–1.2)
Prothrombin Time: 14.5 seconds (ref 11.4–15.2)

## 2022-08-21 LAB — APTT: aPTT: 35 seconds (ref 24–36)

## 2022-08-21 LAB — HEPARIN LEVEL (UNFRACTIONATED): Heparin Unfractionated: 0.21 IU/mL — ABNORMAL LOW (ref 0.30–0.70)

## 2022-08-21 MED ORDER — TICAGRELOR 60 MG PO TABS
60.0000 mg | ORAL_TABLET | Freq: Two times a day (BID) | ORAL | Status: DC
  Administered 2022-08-21 – 2022-08-22 (×3): 60 mg via ORAL
  Filled 2022-08-21 (×3): qty 1

## 2022-08-21 MED ORDER — ASPIRIN 81 MG PO TBEC
81.0000 mg | DELAYED_RELEASE_TABLET | Freq: Every day | ORAL | Status: DC
  Administered 2022-08-21 – 2022-08-22 (×2): 81 mg via ORAL
  Filled 2022-08-21 (×2): qty 1

## 2022-08-21 MED ORDER — EZETIMIBE 10 MG PO TABS
10.0000 mg | ORAL_TABLET | Freq: Every day | ORAL | Status: DC
  Administered 2022-08-21 – 2022-08-23 (×3): 10 mg via ORAL
  Filled 2022-08-21 (×3): qty 1

## 2022-08-21 MED ORDER — METOPROLOL SUCCINATE ER 50 MG PO TB24
50.0000 mg | ORAL_TABLET | Freq: Every day | ORAL | Status: DC
  Administered 2022-08-21 – 2022-08-23 (×3): 50 mg via ORAL
  Filled 2022-08-21 (×3): qty 1

## 2022-08-21 MED ORDER — TAMSULOSIN HCL 0.4 MG PO CAPS
0.4000 mg | ORAL_CAPSULE | Freq: Every day | ORAL | Status: DC
  Administered 2022-08-21 – 2022-08-22 (×2): 0.4 mg via ORAL
  Filled 2022-08-21 (×2): qty 1

## 2022-08-21 MED ORDER — LEVOTHYROXINE SODIUM 88 MCG PO TABS
88.0000 ug | ORAL_TABLET | Freq: Every day | ORAL | Status: DC
  Administered 2022-08-21 – 2022-08-23 (×3): 88 ug via ORAL
  Filled 2022-08-21 (×3): qty 1

## 2022-08-21 MED ORDER — INSULIN GLARGINE-YFGN 100 UNIT/ML ~~LOC~~ SOLN
12.0000 [IU] | Freq: Every day | SUBCUTANEOUS | Status: DC
  Administered 2022-08-21 – 2022-08-23 (×3): 12 [IU] via SUBCUTANEOUS
  Filled 2022-08-21 (×3): qty 0.12

## 2022-08-21 MED ORDER — GABAPENTIN 300 MG PO CAPS
300.0000 mg | ORAL_CAPSULE | Freq: Three times a day (TID) | ORAL | Status: DC
  Administered 2022-08-21 – 2022-08-23 (×7): 300 mg via ORAL
  Filled 2022-08-21 (×7): qty 1

## 2022-08-21 MED ORDER — ATORVASTATIN CALCIUM 20 MG PO TABS
80.0000 mg | ORAL_TABLET | Freq: Every day | ORAL | Status: DC
  Administered 2022-08-21 – 2022-08-22 (×2): 80 mg via ORAL
  Filled 2022-08-21 (×2): qty 4

## 2022-08-21 MED ORDER — HEPARIN (PORCINE) 25000 UT/250ML-% IV SOLN
1400.0000 [IU]/h | INTRAVENOUS | Status: DC
  Administered 2022-08-21: 1300 [IU]/h via INTRAVENOUS
  Filled 2022-08-21: qty 250

## 2022-08-21 NOTE — Progress Notes (Signed)
*  PRELIMINARY RESULTS* Echocardiogram 2D Echocardiogram has been performed.  Cristela Blue 08/21/2022, 9:52 AM

## 2022-08-21 NOTE — Consult Note (Signed)
ANTICOAGULATION CONSULT NOTE - Initial Consult  Pharmacy Consult for Heparin Infusion Indication: new AF, TIA   Allergies  Allergen Reactions   Plavix [Clopidogrel] Hives    Patient Measurements: Height: 6\' 6"  (198.1 cm) Weight: 107.6 kg (237 lb 3.4 oz) IBW/kg (Calculated) : 91.4 Heparin Dosing Weight: 107.6  Vital Signs: Temp: 98.5 F (36.9 C) (08/12 2034) Temp Source: Oral (08/12 2034) BP: 157/85 (08/12 2034) Pulse Rate: 59 (08/12 2034)  Labs: Recent Labs    08/20/22 1955 08/21/22 1632 08/21/22 2056  HGB 10.3* 11.3*  --   HCT 31.8* 35.0*  --   PLT 188 202  --   APTT 33 35  --   LABPROT 15.1 14.5  --   INR 1.2 1.1  --   HEPARINUNFRC  --   --  0.21*  CREATININE 2.09*  --   --     Estimated Creatinine Clearance: 34 mL/min (A) (by C-G formula based on SCr of 2.09 mg/dL (H)).   Medical History: Past Medical History:  Diagnosis Date   Acute lower UTI 09/20/2017   Bradycardia on ECG 07/31/2011   Coronary artery disease    hyperlipidemia   Decreased GFR 09/23/2019   Dehydration 09/21/2017   Diabetes mellitus    Diabetes mellitus without complication (HCC)    Hypercholesterolemia    Hypertension    Hypothyroidism    Left arm pain 01/29/2020   Paronychia of great toe of left foot 09/26/2018   Paronychia of great toe, left 09/26/2018   PERIPHERAL NEUROPATHY 01/13/2006   Qualifier: Diagnosis of  By: Jen Mow MD, Christine     Shortness of breath dyspnea    with exertion   Vasovagal syncope    Weakness 09/20/2017   Assessment: Dylan Robles is a 82 y.o. male presenting with TIA. PMH significant for CAD, HTN, HLD, CAD, dCHF, hypothyroidism, BPH, CKD-3A, peripheral neuropathy, former smoker. Patient now found to be in AF with CHADSVAsc at least 8. Patient was not on [facility] PTA per chart review. Pharmacy has been consulted to initiate and manage heparin infusion.   Baseline Labs: aPTT 35, PT 14.5, INR 1.1, Hgb 11.3, Hct 35.0, Plt 202   8/12 2056   HL 0.21  Goal of Therapy:   Heparin level 0.3-0.5 units/ml Monitor platelets by anticoagulation protocol: Yes   Plan:  8/12 20:56 HL is subtherapeutic No boluses and tighter therapeutic range per heparin stroke protocol Increase heparin infusion to 1400 units/hr Check HL in 8 hours after rate increase Continue to monitor H&H and platelets daily while on heparin infusion   Clovia Cuff, PharmD, BCPS 08/21/2022 9:25 PM

## 2022-08-21 NOTE — Evaluation (Signed)
Physical Therapy Evaluation Patient Details Name: Dylan Robles MRN: [id number] DOB: [birth year] Today's Date: 08/21/2022  History of Present Illness  82 y/o male presented to ED on 08/20/22 for R sided weakness and difficulty speaking. CT negative. MRI showed small focus of acute ischemia at posterior left insula. PMH: CAD, HTN, HLD, dCHF, BPH, CKD-3A, peripheral neuropathy.  Clinical Impression  Patient admitted with the above. PTA, patient lives with wife and was independent with no AD. Currently, patient is not at functional baseline and requires min-modA for sit to stand and minA for ambulation with RW. Demonstrates difficulty with standing dual task at sink with significant R lateral lean. Unable to self correct despite cues and bringing attention to posture with use of mirror at sink. Patient demonstrates R LE weakness, impaired coordination, mild inattention, lack of insight of deficits, impaired balance, and decreased activity tolerance. Wife present and supportive throughout session. Patient will benefit from skilled PT services during acute stay to address listed deficits. Patient will benefit from ongoing therapy at discharge to maximize functional independence and safety. Anticipate with intensive therapy, patient could improve to supervision level with LRAD as patient was independent with no AD prior to admission.       If plan is discharge home, recommend the following: A little help with walking and/or transfers;A little help with bathing/dressing/bathroom;Assistance with cooking/housework;Direct supervision/assist for medications management;Direct supervision/assist for financial management;Assist for transportation;Help with stairs or ramp for entrance;Supervision due to cognitive status   Can travel by private vehicle        Equipment Recommendations Rolling  (2 wheels)  Recommendations for Other Services  Rehab consult    Functional Status Assessment Patient has had a  recent decline in their functional status and demonstrates the ability to make significant improvements in function in a reasonable and predictable amount of time.     Precautions / Restrictions Precautions Precautions: Fall Precaution Comments: mild R inattention Restrictions Weight Bearing Restrictions: No      Mobility  Bed Mobility Overal bed mobility: Needs Assistance Bed Mobility: Supine to Sit, Sit to Supine     Supine to sit: Contact guard, HOB elevated, Used rails Sit to supine: Min assist, HOB elevated, Used rails   General bed mobility comments: assist to bring R LE onto bed    Transfers Overall transfer level: Needs assistance Equipment used: Rolling  (2 wheels) Transfers: Sit to/from Stand Sit to Stand: Min assist, Mod assist           General transfer comment: minA from elevated bed surface and modA to stand from low toilet    Ambulation/Gait Ambulation/Gait assistance: Min assist Gait Distance (Feet): 12 Feet (+12') Assistive device: Rolling  (2 wheels) Gait Pattern/deviations: Step-through pattern, Decreased stride length, Drifts right/left, Trunk flexed Gait velocity: decreased     General Gait Details: assist for RW management and balance as patient with R lateral lean while ambulating with inability to correct despite cues  Stairs            Wheelchair Mobility     Tilt Bed    Modified Rankin (Stroke Patients Only) Modified Rankin (Stroke Patients Only) Pre-Morbid Rankin Score: No symptoms Modified Rankin: Moderately severe disability     Balance Overall balance assessment: Needs assistance Sitting-balance support: No upper extremity supported, Feet supported Sitting balance-Leahy Scale: Fair   Postural control: Right lateral lean Standing balance support: No upper extremity supported, During functional activity Standing balance-Leahy Scale: Poor Standing balance comment: with addition of dual task, patient  with  significant R lateral lean at sink against wall. Continued to have difficulty correcting while performing task despite cues and bringing attention to posture.                             Pertinent Vitals/Pain Pain Assessment Pain Assessment: No/denies pain    Home Living Family/patient expects to be discharged to:: Private residence Living Arrangements: Spouse/significant other Available Help at Discharge: Family;Available 24 hours/day Type of Home: House Home Access: Ramped entrance       Home Layout: One level Home Equipment: Agricultural consultant (2 wheels);Cane - single point;Shower seat      Prior Function Prior Level of Function : Independent/Modified Independent             Mobility Comments: ind with no AD       Extremity/Trunk Assessment   Upper Extremity Assessment Upper Extremity Assessment: Defer to OT evaluation    Lower Extremity Assessment Lower Extremity Assessment: RLE deficits/detail RLE Deficits / Details: grossly 3+/5. Functionally weak with mild R inattention    Cervical / Trunk Assessment Cervical / Trunk Assessment: Kyphotic  Communication   Communication Communication: Difficulty communicating thoughts/reduced clarity of speech  Cognition Arousal: Alert Behavior During Therapy: Flat affect Overall Cognitive Status: Impaired/Different from baseline Area of Impairment: Awareness, Problem solving, Safety/judgement, Following commands, Attention                   Current Attention Level: Sustained   Following Commands: Follows one step commands with increased time Safety/Judgement: Decreased awareness of deficits, Decreased awareness of safety Awareness: Emergent Problem Solving: Slow processing, Requires verbal cues          General Comments      Exercises     Assessment/Plan    PT Assessment Patient needs continued PT services  PT Problem List Decreased strength;Decreased balance;Decreased activity  tolerance;Decreased mobility;Decreased coordination;Decreased cognition;Decreased knowledge of use of DME;Decreased safety awareness;Decreased knowledge of precautions       PT Treatment Interventions DME instruction;Gait training;Stair training;Therapeutic activities;Functional mobility training;Therapeutic exercise;Balance training;Neuromuscular re-education;Patient/family education    PT Goals (Current goals can be found in the Care Plan section)  Acute Rehab PT Goals Patient Stated Goal: to get back to independent PT Goal Formulation: With patient/family Time For Goal Achievement: 09/04/22 Potential to Achieve Goals: Good Additional Goals Additional Goal #1: Patient will perform dual task while ambulating with no LOB or R lateral lean with supervision and LRAD.    Frequency Min 1X/week     Co-evaluation               AM-PAC PT "6 Clicks" Mobility  Outcome Measure Help needed turning from your back to your side while in a flat bed without using bedrails?: A Little Help needed moving from lying on your back to sitting on the side of a flat bed without using bedrails?: A Little Help needed moving to and from a bed to a chair (including a wheelchair)?: A Little Help needed standing up from a chair using your arms (e.g., wheelchair or bedside chair)?: A Lot Help needed to walk in hospital room?: A Little Help needed climbing 3-5 steps with a railing? : A Lot 6 Click Score: 16    End of Session Equipment Utilized During Treatment: Gait belt Activity Tolerance: Patient tolerated treatment well Patient left: in bed;with call bell/phone within reach;Other (comment) (echo at bedside) Nurse Communication: Mobility status PT Visit Diagnosis: Unsteadiness on feet (  R26.81);Muscle weakness (generalized) (M62.81);Other abnormalities of gait and mobility (R26.89);Difficulty in walking, not elsewhere classified (R26.2);Other symptoms and signs involving the nervous system (R29.898)     Time: 2130-8657 PT Time Calculation (min) (ACUTE ONLY): 25 min   Charges:   PT Evaluation $PT Eval Moderate Complexity: 1 Mod PT Treatments $Therapeutic Activity: 8-22 mins PT General Charges $$ ACUTE PT VISIT: 1 Visit         Maylon Peppers, PT, DPT Physical Therapist - [facility]  [facility]    A  08/21/2022, 9:30 AM

## 2022-08-21 NOTE — Progress Notes (Addendum)
Subjective: Continues to have speech difficulty  Exam: Vitals:   08/21/22 0926 08/21/22 1216  BP:  (!) 164/73  Pulse: 62 (!) 58  Resp:  18  Temp:  98.4 F (36.9 C)  SpO2:  96%   Gen: In bed, NAD Resp: non-labored breathing, no acute distress Abd: soft, nt  Neuro: MS: Awake, alert, significant aphasia, most prominent with naming, but some receptive component as well. CN: Endorses seeing fingers wiggle in both hemifield's, face is symmetric Motor: He is able to raise all extremities against gravity, but mild 4/5 weakness of the right leg Sensory: Endorses symmetric sensation   Pertinent Labs: LDL 77 A1c pending CTA head and neck-no relevant stenosis MRI brain-infarction in the left insula  Impression: 82 year old male with transient right-sided hemiplegia and aphasia which is markedly improved though not resolved with small area of ischemia on MRI.  My suspicion is that this represents a "mostly transient" ischemic attack with a much larger area initially at risk, with auto reperfusion resulting in improvement in symptoms.  He is already on Zetia and atorvastatin for cholesterol control, but not quite at goal.  I am not sure if I would consider this to be an atherogenic event as it was likely embolic based on symptomatology.  He has been found to be in atrial fibrillation, and this is likely etiology.  Recommendations: 1) he will need long-term anticoagulation, but question whether he needs to be on DAPT in addition. 2) continue Zetia and atorvastatin 3) PT, OT, ST 4) permissive hypertension for the first 48 hours followed by gradual reduction towards goal normotension. 5) with small size of infarct I think that the hemorrhage risk is relatively low, could start low-dose heparin. 6) neurology will follow  Ritta Slot, MD [facility] [phone]  If 7pm- 7am, please page neurology on call as listed in AMION.

## 2022-08-21 NOTE — Progress Notes (Signed)
Eeg done 

## 2022-08-21 NOTE — Progress Notes (Signed)
1607: Wife stated patient had blood in his urine which was not present this morning. Before initiating heparin gtt, patient voided in the urinal and hematuria not noted at this time. Dr. Allena Katz notified before initiating heparin gtt. Will continue to monitor for sign of bleeding.  1804: Bleeding noted, dripping from his penis. Dr. Allena Katz  and pharmacist notified.

## 2022-08-21 NOTE — Progress Notes (Signed)
Inpatient Rehab Admissions Coordinator:  ? ?Per therapy recommendations,  patient was screened for CIR candidacy by Laura Staley, MS, CCC-SLP. At this time, Pt. Appears to be a a potential candidate for CIR. I will place   order for rehab consult per protocol for full assessment. Please contact me any with questions. ? ?Laura Staley, MS, CCC-SLP ?Rehab Admissions Coordinator  ?[phone] (celll) ?[phone] (office) ? ?

## 2022-08-21 NOTE — Plan of Care (Signed)

## 2022-08-21 NOTE — Consult Note (Signed)
ANTICOAGULATION CONSULT NOTE - Initial Consult  Pharmacy Consult for Heparin Infusion Indication: new AF, TIA   Allergies  Allergen Reactions   Plavix [Clopidogrel] Hives    Patient Measurements: Height: 6\' 6"  (198.1 cm) Weight: 107.6 kg (237 lb 3.4 oz) IBW/kg (Calculated) : 91.4 Heparin Dosing Weight: 107.6  Vital Signs: Temp: 98.4 F (36.9 C) (08/12 1216) Temp Source: Oral (08/12 1216) BP: 164/73 (08/12 1216) Pulse Rate: 58 (08/12 1216)  Labs: Recent Labs    08/20/22 1955  HGB 10.3*  HCT 31.8*  PLT 188  APTT 33  LABPROT 15.1  INR 1.2  CREATININE 2.09*    Estimated Creatinine Clearance: 34 mL/min (A) (by C-G formula based on SCr of 2.09 mg/dL (H)).   Medical History: Past Medical History:  Diagnosis Date   Acute lower UTI 09/20/2017   Bradycardia on ECG 07/31/2011   Coronary artery disease    hyperlipidemia   Decreased GFR 09/23/2019   Dehydration 09/21/2017   Diabetes mellitus    Diabetes mellitus without complication (HCC)    Hypercholesterolemia    Hypertension    Hypothyroidism    Left arm pain 01/29/2020   Paronychia of great toe of left foot 09/26/2018   Paronychia of great toe, left 09/26/2018   PERIPHERAL NEUROPATHY 01/13/2006   Qualifier: Diagnosis of  By: Jen Mow MD, Christine     Shortness of breath dyspnea    with exertion   Vasovagal syncope    Weakness 09/20/2017   Assessment: Dylan Robles is a 82 y.o. male presenting with TIA. PMH significant for CAD, HTN, HLD, CAD, dCHF, hypothyroidism, BPH, CKD-3A, peripheral neuropathy, former smoker. Patient now found to be in AF with CHADSVAsc at least 8. Patient was not on [facility] PTA per chart review. Pharmacy has been consulted to initiate and manage heparin infusion.   Baseline Labs: aPTT 35, PT 14.5, INR 1.1, Hgb 11.3, Hct 35.0, Plt 202   Goal of Therapy:  Heparin level 0.3-0.5 units/ml Monitor platelets by anticoagulation protocol: Yes   Plan:  No boluses and tighter therapeutic range per heparin  stroke protocol Start heparin infusion at 1300 units/hr Check HL in 8 hours  Continue to monitor H&H and platelets daily while on heparin infusion   Celene Squibb, PharmD Clinical Pharmacist 08/21/2022 3:07 PM

## 2022-08-21 NOTE — Progress Notes (Signed)
[facility]  - [place] at [facility]   PATIENT NAME: Dylan Robles    MR#:  [id number]  DATE OF BIRTH:  18-Jun-1940  SUBJECTIVE:  patient's brother and sister-in-law in the room. Came in with difficulty speaking and right-sided weakness. Found to have acute stroke. Able to move his right upper lower extremity very well. Speech is improving although has expressive aviation intermittently. Evaluated by speech therapist. Evaluated by PT OT as well. Neurology on board.    VITALS:  Blood pressure (!) 164/73, pulse (!) 58, temperature 98.4 F (36.9 C), temperature source Oral, resp. rate 18, height 6\' 6"  (1.981 m), weight 107.6 kg, SpO2 96%.  PHYSICAL EXAMINATION:   GENERAL:  82 y.o.-year-old patient with no acute distress.  LUNGS: Normal breath sounds bilaterally, no wheezing CARDIOVASCULAR: S1, S2 normal. No murmur   ABDOMEN: Soft, nontender, nondistended. Bowel sounds present.  EXTREMITIES: No  edema b/l.    NEUROLOGIC: nonfocal  patient is alert and awake mild expressive aphasia. Right UE /LE weakness improving SKIN: No obvious rash, lesion, or ulcer.   LABORATORY PANEL:  CBC Recent Labs  Lab 08/20/22 1955  WBC 6.8  HGB 10.3*  HCT 31.8*  PLT 188    Chemistries  Recent Labs  Lab 08/20/22 1955  NA 135  K 4.1  CL 103  CO2 22  GLUCOSE 256*  BUN 35*  CREATININE 2.09*  CALCIUM 8.7*  AST 24  ALT 28  ALKPHOS 95  BILITOT 0.8   Cardiac Enzymes No results for input(s): "TROPONINI" in the last 168 hours. RADIOLOGY:  ECHOCARDIOGRAM COMPLETE  Result Date: 08/21/2022    ECHOCARDIOGRAM REPORT   Patient Name:   Dylan Robles Date of Exam: 08/21/2022 Medical Rec #:  [id number]     Height:       78.0 in Accession #:    [id number]    Weight:       237.2 lb Date of Birth:  10-30-1940      BSA:          2.427 m Patient Age:    82 years      BP:           133/73 mmHg Patient Gender: M             HR:           57 bpm. Exam Location:  [facility] Procedure: 2D Echo, Cardiac  Doppler, Color Doppler and Saline Contrast Bubble            Study Indications:     TIA G45.9  History:         Patient has prior history of Echocardiogram examinations, most                  recent 03/25/2020. CAD; Risk Factors:Diabetes and Hypertension.  Sonographer:     Cristela Blue Referring Phys:  Wynona Neat NIU Diagnosing Phys: Adrian Blackwater IMPRESSIONS  1. Left ventricular ejection fraction, by estimation, is 55 to 60%. The left ventricle has normal function. The left ventricle has no regional wall motion abnormalities. Left ventricular diastolic parameters were normal.  2. Right ventricular systolic function is normal. The right ventricular size is normal.  3. Left atrial size was mildly dilated.  4. Right atrial size was mildly dilated.  5. The mitral valve is normal in structure. Mild mitral valve regurgitation. No evidence of mitral stenosis.  6. The aortic valve is normal in structure. Aortic valve regurgitation is not visualized. Aortic valve sclerosis/calcification  is present, without any evidence of aortic stenosis.  7. The inferior vena cava is normal in size with greater than 50% respiratory variability, suggesting right atrial pressure of 3 mmHg. FINDINGS  Left Ventricle: Left ventricular ejection fraction, by estimation, is 55 to 60%. The left ventricle has normal function. The left ventricle has no regional wall motion abnormalities. The left ventricular internal cavity size was normal in size. There is  no left ventricular hypertrophy. Left ventricular diastolic parameters were normal. Right Ventricle: The right ventricular size is normal. No increase in right ventricular wall thickness. Right ventricular systolic function is normal. Left Atrium: Left atrial size was mildly dilated. Right Atrium: Right atrial size was mildly dilated. Pericardium: There is no evidence of pericardial effusion. Mitral Valve: The mitral valve is normal in structure. Mild mitral valve regurgitation. No evidence of mitral  valve stenosis. Tricuspid Valve: The tricuspid valve is normal in structure. Tricuspid valve regurgitation is mild . No evidence of tricuspid stenosis. Aortic Valve: The aortic valve is normal in structure. Aortic valve regurgitation is not visualized. Aortic valve sclerosis/calcification is present, without any evidence of aortic stenosis. Aortic valve mean gradient measures 3.0 mmHg. Aortic valve peak  gradient measures 5.0 mmHg. Aortic valve area, by VTI measures 2.24 cm. Pulmonic Valve: The pulmonic valve was normal in structure. Pulmonic valve regurgitation is not visualized. No evidence of pulmonic stenosis. Aorta: The aortic root is normal in size and structure. Venous: The inferior vena cava is normal in size with greater than 50% respiratory variability, suggesting right atrial pressure of 3 mmHg. IAS/Shunts: No atrial level shunt detected by color flow Doppler. Agitated saline contrast was given intravenously to evaluate for intracardiac shunting.  LEFT VENTRICLE PLAX 2D LVIDd:         4.60 cm   Diastology LVIDs:         3.20 cm   LV e' medial:    8.27 cm/s LV PW:         1.20 cm   LV E/e' medial:  11.7 LV IVS:        0.90 cm   LV e' lateral:   13.50 cm/s LVOT diam:     2.10 cm   LV E/e' lateral: 7.2 LV SV:         41 LV SV Index:   17 LVOT Area:     3.46 cm  RIGHT VENTRICLE RV Basal diam:  4.00 cm RV Mid diam:    3.30 cm RV S prime:     11.60 cm/s TAPSE (M-mode): 2.0 cm LEFT ATRIUM           Index        RIGHT ATRIUM           Index LA diam:      5.00 cm 2.06 cm/m   RA Area:     17.40 cm LA Vol (A2C): 99.8 ml 41.12 ml/m  RA Volume:   49.30 ml  20.31 ml/m LA Vol (A4C): 40.0 ml 16.48 ml/m  AORTIC VALVE AV Area (Vmax):    2.10 cm AV Area (Vmean):   2.13 cm AV Area (VTI):     2.24 cm AV Vmax:           112.00 cm/s AV Vmean:          71.750 cm/s AV VTI:            0.184 m AV Peak Grad:      5.0 mmHg AV Mean Grad:  3.0 mmHg LVOT Vmax:         67.90 cm/s LVOT Vmean:        44.100 cm/s LVOT VTI:           0.119 m LVOT/AV VTI ratio: 0.65  AORTA Ao Root diam: 3.90 cm MITRAL VALVE               TRICUSPID VALVE MV Area (PHT): 5.16 cm    TR Peak grad:   11.6 mmHg MV Decel Time: 147 msec    TR Vmax:        170.00 cm/s MV E velocity: 96.90 cm/s MV A velocity: 55.50 cm/s  SHUNTS MV E/A ratio:  1.75        Systemic VTI:  0.12 m                            Systemic Diam: 2.10 cm Adrian Blackwater Electronically signed by Adrian Blackwater Signature Date/Time: 08/21/2022/11:21:14 AM    Final    MR BRAIN WO CONTRAST  Result Date: 08/20/2022 CLINICAL DATA:  Transient ischemic attack EXAM: MRI HEAD WITHOUT CONTRAST TECHNIQUE: Multiplanar, multiecho pulse sequences of the brain and surrounding structures were obtained without intravenous contrast. COMPARISON:  None Available. FINDINGS: Brain: Small focus of acute ischemia at posterior left insula. No acute or chronic hemorrhage. There is multifocal hyperintense T2-weighted signal within the white matter. Generalized volume loss. Multiple old small vessel infarcts of the deep white matter. The midline structures are normal. Vascular: Major flow voids are preserved. Skull and upper cervical spine: Normal calvarium and skull base. Visualized upper cervical spine and soft tissues are normal. Sinuses/Orbits:No paranasal sinus fluid levels or advanced mucosal thickening. No mastoid or middle ear effusion. Normal orbits. IMPRESSION: 1. Small focus of acute ischemia at posterior left insula. No hemorrhage or mass effect. 2. Multiple old small vessel infarcts of the deep white matter. Electronically Signed   By: Deatra Robinson M.D.   On: 08/20/2022 22:21   CT ANGIO HEAD NECK W WO CM (CODE STROKE)  Result Date: 08/20/2022 CLINICAL DATA:  Stroke follow-up EXAM: CT ANGIOGRAPHY HEAD AND NECK WITH AND WITHOUT CONTRAST TECHNIQUE: Multidetector CT imaging of the head and neck was performed using the standard protocol during bolus administration of intravenous contrast. Multiplanar CT image  reconstructions and MIPs were obtained to evaluate the vascular anatomy. Carotid stenosis measurements (when applicable) are obtained utilizing NASCET criteria, using the distal internal carotid diameter as the denominator. RADIATION DOSE REDUCTION: This exam was performed according to the departmental dose-optimization program which includes automated exposure control, adjustment of the mA and/or kV according to patient size and/or use of iterative reconstruction technique. CONTRAST:  75mL OMNIPAQUE IOHEXOL 350 MG/ML SOLN COMPARISON:  08/20/2022 FINDINGS: CTA NECK FINDINGS SKELETON: There is no bony spinal canal stenosis. No lytic or blastic lesion. OTHER NECK: Normal pharynx, larynx and major salivary glands. No cervical lymphadenopathy. Unremarkable thyroid gland. UPPER CHEST: No pneumothorax or pleural effusion. No nodules or masses. AORTIC ARCH: There is calcific atherosclerosis of the aortic arch. Conventional 3 vessel aortic branching pattern. RIGHT CAROTID SYSTEM: No dissection, occlusion or aneurysm. There is mixed density atherosclerosis extending into the proximal ICA, resulting in 50% stenosis. LEFT CAROTID SYSTEM: No dissection, occlusion or aneurysm. Mild atherosclerotic calcification at the carotid bifurcation without hemodynamically significant stenosis. VERTEBRAL ARTERIES: Right dominant configuration.There is no dissection, occlusion or flow-limiting stenosis to the skull base (V1-V3 segments). CTA HEAD FINDINGS POSTERIOR CIRCULATION: --Vertebral arteries:  Severe stenosis of the left V4 segment over length of approximately 5 mm (series 8 image 151. Mild atherosclerosis of the right V4 segment without stenosis. --Inferior cerebellar arteries: Normal. --Basilar artery: Diminutive basilar artery --Superior cerebellar arteries: Normal. --Posterior cerebral arteries (PCA): Normal. ANTERIOR CIRCULATION: --Intracranial internal carotid arteries: Normal. --Anterior cerebral arteries (ACA): Normal. Both A1  segments are present. Patent anterior communicating artery (a-comm). --Middle cerebral arteries (MCA): Normal. VENOUS SINUSES: As permitted by contrast timing, patent. ANATOMIC VARIANTS: None Review of the MIP images confirms the above findings. IMPRESSION: 1. No emergent large vessel occlusion. 2. Severe stenosis of the left vertebral artery V4 segment over a length of approximately 5 mm. 3. A 50% stenosis of the proximal right internal carotid artery secondary to mixed density atherosclerosis. Aortic Atherosclerosis (ICD10-I70.0). Electronically Signed   By: Deatra Robinson M.D.   On: 08/20/2022 20:03   CT HEAD CODE STROKE WO CONTRAST  Result Date: 08/20/2022 CLINICAL DATA:  Code stroke.  Slurred speech EXAM: CT HEAD WITHOUT CONTRAST TECHNIQUE: Contiguous axial images were obtained from the base of the skull through the vertex without intravenous contrast. RADIATION DOSE REDUCTION: This exam was performed according to the departmental dose-optimization program which includes automated exposure control, adjustment of the mA and/or kV according to patient size and/or use of iterative reconstruction technique. COMPARISON:  None Available. FINDINGS: Brain: There is no mass, hemorrhage or extra-axial collection. There is generalized atrophy without lobar predilection. There is hypoattenuation of the periventricular white matter, most commonly indicating chronic ischemic microangiopathy. Old right basal ganglia small vessel infarct. Vascular: Atherosclerotic calcification of the vertebral and internal carotid arteries at the skull base. No abnormal hyperdensity of the major intracranial arteries or dural venous sinuses. Skull: The visualized skull base, calvarium and extracranial soft tissues are normal. Sinuses/Orbits: No fluid levels or advanced mucosal thickening of the visualized paranasal sinuses. No mastoid or middle ear effusion. The orbits are normal. ASPECTS [facility] Stroke Program Early CT Score) -  Ganglionic level infarction (caudate, lentiform nuclei, internal capsule, insula, M1-M3 cortex): 7 - Supraganglionic infarction (M4-M6 cortex): 3 Total score (0-10 with 10 being normal): 10 IMPRESSION: 1. No acute intracranial abnormality. 2. ASPECTS is 10. These results were called by telephone at the time of interpretation on 08/20/2022 at 7:44 pm to provider [facility] , who verbally acknowledged these results. Electronically Signed   By: Deatra Robinson M.D.   On: 08/20/2022 19:44    Assessment and Plan   Dylan Robles is a 82 y.o. male with medical history significant of CAD, HTN, HLD, CAD, dCHF, hypothyroidism, BPH, CKD-3A, peripheral neuropathy, former smoker, who presents with AMS, confusion, right-sided weakness, difficulty speaking   CTA of head and neck: 1. No emergent large vessel occlusion. 2. Severe stenosis of the left vertebral artery V4 segment over a length of approximately 5 mm. 3. A 50% stenosis of the proximal right internal carotid artery secondary to mixed density atherosclerosis.  MRI brain Small focus of acute ischemia at posterior left insula. No hemorrhage or mass effect. 2. Multiple old small vessel infarcts of the deep white matter.   Acute CVA left post insula -- Patient's symptoms are concerning for va vs  TIA.  --  Patient is found to have new onset atrial fibrillation which is likely the etiology.  --CTA of head and neck negative for LVO.   --Consulted Dr. Sandrea Hammond of neurology--low dose heparin gtt for now. Discussion about DAPT and consideration of DOAC - will continue home ASA and Brilinta  -  Statin: lipitor - 2D transthoracic echocardiography  - PT/OT consult noted--acute inpatient rehab -Seizure precaution -As needed Ativan for seizure   New onset atrial fibrillation (HCC): HR is 70s.  CHADS2 score is 8, will need chronic anticoagulants. -continue home metoprolol -Consulted Dr Wynelle Link khan--recommends DOAC   Chronic diastolic CHF (congestive heart  failure) (HCC): 2D echo on 03/25/2020 showed EF 68.5% with grade 1 diastolic dysfunction.  Patient has mild shortness of breath and trace leg edema, no oxygen desaturation, no JVD, clinically does not seem to have CHF exacerbation. -Hold diuretics: Lasix and spironolactone due to worsening renal function   Acute metabolic encephalopathy: Most likely due to cva -Fall precaution --mentation much improved  Hypothyroidism -Synthroid   Essential hypertension - prn IV hydralazine for SBP>220 or dBP>110 -Hold Lasix, spironolactone, Imdur -Continue metoprolol for rate control of A-fib   Hyperlipidemia -Lipitor and Zetia   Type II diabetes mellitus with renal manifestations (HCC): Recent A1c 10.2, poorly controlled.  Patient is taking NovoLog and Tresiba 16 unit daily -Sliding scale insulin +lantus  Acute renal failure superimposed on stage 3a chronic kidney disease (HCC): Likely due to dehydration and continuation for diuretics -Hold Lasix and spironolactone -Patient received 500 cc normal saline --baseline creat 1.42-- 1.56   BPH (benign prostatic hyperplasia) -Flomax   CAD: no CP. S/p of PCI, no stent placement per his wife -lipitor -ASA and Brilinta     Procedures: Family communication :brother Consults : neurology, cardiology CODE STATUS: full DVT Prophylaxis : heparin drip Level of care: Telemetry Medical Status is: Inpatient Remains inpatient appropriate because: cva    TOTAL TIME TAKING CARE OF THIS PATIENT: 35 minutes.  >50% time spent on counselling and coordination of care  Note: This dictation was prepared with Dragon dictation along with smaller phrase technology. Any transcriptional errors that result from this process are unintentional.  Enedina Finner M.D    [facility]   CC: Primary care physician; Billie Lade, MD

## 2022-08-21 NOTE — Evaluation (Signed)
Speech Language Pathology Evaluation Patient Details Name: Dylan Robles MRN: [id number] DOB: [birth year] Today's Date: 08/21/2022 Time: 7425-9563 SLP Time Calculation (min) (ACUTE ONLY): 60 min  Problem List:  Patient Active Problem List   Diagnosis Date Noted   TIA (transient ischemic attack) 08/20/2022   Type II diabetes mellitus with renal manifestations (HCC) 08/20/2022   Acute renal failure superimposed on stage 3a chronic kidney disease (HCC) 08/20/2022   Chronic diastolic CHF (congestive heart failure) (HCC) 08/20/2022   New onset atrial fibrillation (HCC) 08/20/2022   Acute metabolic encephalopathy 08/20/2022   CAD (coronary artery disease) 08/20/2022   BPH (benign prostatic hyperplasia) 06/14/2022   Venous stasis dermatitis of right lower extremity 06/14/2022   SOB (shortness of breath) 04/18/2022   Bilateral lower extremity edema 02/15/2022   Overweight (BMI 25.0-29.9) 06/10/2021   Encounter for well adult exam with abnormal findings 05/17/2021   Proctitis 05/17/2021   Stage 3a chronic kidney disease (HCC) 03/03/2020   Atherosclerosis of native arteries of extremity with intermittent claudication (HCC) 12/19/2018   Hyperlipidemia 12/19/2018   Diabetic neuropathy (HCC) 09/26/2018   Type 2 diabetes mellitus with vascular disease (HCC) 09/26/2018   Coronary artery disease of native artery of native heart with stable angina pectoris (HCC) 09/20/2017   Type 2 diabetes mellitus with hyperlipidemia (HCC) 09/16/2008   Hypothyroidism 03/29/2006   Essential hypertension 01/13/2006   PERIPHERAL VASCULAR DISEASE 01/13/2006   Past Medical History:  Past Medical History:  Diagnosis Date   Acute lower UTI 09/20/2017   Bradycardia on ECG 07/31/2011   Coronary artery disease    hyperlipidemia   Decreased GFR 09/23/2019   Dehydration 09/21/2017   Diabetes mellitus    Diabetes mellitus without complication (HCC)    Hypercholesterolemia    Hypertension    Hypothyroidism    Left arm  pain 01/29/2020   Paronychia of great toe of left foot 09/26/2018   Paronychia of great toe, left 09/26/2018   PERIPHERAL NEUROPATHY 01/13/2006   Qualifier: Diagnosis of  By: Jen Mow MD, Christine     Shortness of breath dyspnea    with exertion   Vasovagal syncope    Weakness 09/20/2017   Past Surgical History:  Past Surgical History:  Procedure Laterality Date   APPENDECTOMY     CARDIAC CATHETERIZATION N/A 06/16/2014   Procedure: Left Heart Cath;  Surgeon: Laurier Nancy, MD;  Location: [facility] INVASIVE CV LAB;  Service: Cardiovascular;  Laterality: N/A;   CIRCUMCISION, NON-NEWBORN     HERNIA REPAIR     LOWER EXTREMITY ANGIOGRAPHY Left 12/24/2018   Procedure: LOWER EXTREMITY ANGIOGRAPHY;  Surgeon: Renford Dills, MD;  Location: [facility] INVASIVE CV LAB;  Service: Cardiovascular;  Laterality: Left;   THYROID SURGERY     HPI:  Pt is a 82 year old man past history of coronary artery disease, hyperlipidemia, diabetes, hypertension, prior syncope episodes, presented to the emergency room for evaluation of sudden onset of not following commands, staring and being unresponsive followed by right-sided weakness.  Per ED note, patient "has difficulty speaking, and right-sided weakness which has resolved at arrival to ED. When I saw patient in ED, he still has some difficulty speaking, no unilateral numbness or tingling in extremities. Patient has decreased sensation in right leg.".  MRI: Small focus of acute ischemia at posterior left insula. No  hemorrhage or mass effect.  2. Multiple old small vessel infarcts of the deep white matter.   Assessment / Plan / Recommendation Clinical Impression   Pt seen for informal speech/language evaluation using  components of the Western Aphasic Battery(WAB). Pt was alert, pleasant, and cooperative; often smiling. Family present during session. Notable expressive language deficits and slight frustration noted when unable to get his words out.  On RA, afebrile.   Evaluation  completed via informal means and portions of Western Aphasia Battery Revised (Bedside Record Form). Pt presents w/ speech/language impairment most c/w anomic Aphasia. Pt's speech is c/b anomia(could not name Family members in room) and hesitations of speech w/ inconsistent repair of breakdowns; both phonemic and semantic paraphasias noted. Inconsistent impaired responsive naming for nouns and convergent naming; pt stimulable for verbal hierachical cues (e.g. sentence completion, phonemic cueing). Phonemic errors included "lazor" for "razor"; semantic errors included "bit it" for "peel it". Pt w/ suspected reduced auditory comprehension for complex information including complex yes/no questions and 2+-step/complex commands(potential for some impact on responses by Expressive deficits). Pt completed nonverbal tasks of object function w/ increased accuracy. Pt with impaired spatial, temporal, and situational orientation as well as impaired verbal-problem solving -- pt could not ID calling "911" for and Emergency situation. Cognitive-linguistic tasks including orientation and verbal problem-solving or identification of needs in a certain situation could likely be impacted by linguistic nature of tasks.  Speech intelligibility was functional; no overt OM weakness nor Dysarthria noted.    Recommend ongoing therapy targeting functional communication in current setting and ST services post Discharge for ongoing formal assessment of strengths/deficits. Recommend frequent/constant Supervision at D/C(24/7) for safety. SLP to f/u per POC for continued speech/language tx targeting functional communication while admitted; ongoing education w/ Family.   Pt made aware of results of assessment, changes to functional communication following stroke, basic communication strategies, and SLP POC. Pt/Family verbalized understanding; MD/NSG/TOC made aware of results of assessment, recommendations, communication strategies, and SLP  POC.   SLP Assessment  SLP Recommendation/Assessment: Patient needs continued Speech Lanaguage Pathology Services (and at D/C) SLP Visit Diagnosis: Aphasia (R47.01)    Recommendations for follow up therapy are one component of a multi-disciplinary discharge planning process, led by the attending physician.  Recommendations may be updated based on patient status, additional functional criteria and insurance authorization.    Follow Up Recommendations  Acute inpatient rehab (3hours/day)    Assistance Recommended at Discharge  Frequent or constant Supervision/Assistance  Functional Status Assessment Patient has had a recent decline in their functional status and demonstrates the ability to make significant improvements in function in a reasonable and predictable amount of time.  Frequency and Duration min 2x/week  2 weeks      SLP Evaluation Cognition  Overall Cognitive Status: Impaired/Different from baseline (responses potentially impacted by Expressive Language deficits) Arousal/Alertness: Awake/alert Orientation Level: Oriented to person;Oriented to time;Oriented to place;Disoriented to situation (needed time and a F:2 x2) Year: 2024 Month: August Attention: Focused;Sustained Focused Attention: Appears intact Sustained Attention: Appears intact Behaviors:  (min frustrated) Safety/Judgment: Impaired Comments: could not ID "911" as the number to call in an emergency -- response potentially impacted by Expressive Language deficits       Comprehension  Auditory Comprehension Overall Auditory Comprehension: Impaired (needs more formal assessment at higher levels) Yes/No Questions: Within Functional Limits (basic) Commands: Within Functional Limits (1-Step) Conversation: Simple Other Conversation Comments: responses potentially impacted by Expressive Language deficits Interfering Components: Motor planning (Expressive Language deficits) EffectiveTechniques: Extra processing  time Visual Recognition/Discrimination Discrimination: Not tested Reading Comprehension Reading Status: Impaired Word level: Within functional limits Sentence Level: Impaired    Expression Expression Primary Mode of Expression: Verbal Verbal Expression Overall Verbal Expression: Impaired  Initiation: Impaired (WFL w/ most automatic speech, greetings) Automatic Speech: Name;Social Response (WFL; errors in #s and DOW) Level of Generative/Spontaneous Verbalization: Word;Phrase (WFL) Repetition: Impaired Level of Impairment: Word level Naming: Impairment Responsive: 51-75% accurate Confrontation: Impaired Convergent: 25-49% accurate Divergent: Not tested Other Naming Comments: phonemic vs semantic cues were successful >50% of the time Verbal Errors: Semantic paraphasias;Phonemic paraphasias;Aware of errors;Not aware of errors Pragmatics: No impairment Effective Techniques: Sentence completion;Phonemic cues Non-Verbal Means of Communication: Not applicable Written Expression Dominant Hand: Right Written Expression: Not tested   Oral / Motor  Oral Motor/Sensory Function Overall Oral Motor/Sensory Function: Within functional limits Motor Speech Overall Motor Speech: Appears within functional limits for tasks assessed Respiration: Within functional limits Phonation: Normal Resonance: Within functional limits Articulation: Within functional limitis Intelligibility: Intelligible             Jerilynn Som, MS, CCC-SLP Speech Language Pathologist Rehab Services; [facility] [phone] (ascom) , 08/21/2022, 3:04 PM

## 2022-08-21 NOTE — TOC Progression Note (Signed)
Transition of Care [facility]) - Progression Note    Patient Details  Name: Dylan Robles MRN: [id number] Date of Birth: Jun 05, 1940  [facility]) CM/SW Contact  Garret Reddish, RN Phone Number: 08/21/2022, 1:31 PM  Clinical Narrative:   Chart reviewed.  Noted that patient was admitted for TIA.  Noted that PT has recommend Acute Inpatient Rehab.  Noted that CIR is evaluating patient for possible admission.    TOC will continue to follow for discharge planning.           Expected Discharge Plan and Services                                               Social Determinants of Health (SDOH) Interventions SDOH Screenings   Food Insecurity: No Food Insecurity (08/20/2022)  Housing: Low Risk  (08/20/2022)  Transportation Needs: No Transportation Needs (08/20/2022)  Utilities: Not At Risk (08/20/2022)  Alcohol Screen: Low Risk  (03/29/2021)  Depression (PHQ2-9): Low Risk  (06/14/2022)  Financial Resource Strain: Low Risk  (03/29/2021)  Physical Activity: Insufficiently Active (03/29/2021)  Social Connections: Socially Integrated (03/29/2021)  Stress: No Stress Concern Present (03/29/2021)  Tobacco Use: Medium Risk (08/20/2022)    Readmission Risk Interventions     No data to display

## 2022-08-21 NOTE — Consult Note (Signed)
Dylan Robles is a 82 y.o. male  [id number]  Primary Cardiologist: Adrian Blackwater Reason for Consultation: Atrial fibrillation  HPI: This 82 year old African-American male who presented to the hospital with altered mental status and right-sided weakness and was found to have CVA on MRI.  Patient was found to be in atrial fibrillation thus I was asked to evaluate the patient   Review of Systems: No chest pain   Past Medical History:  Diagnosis Date   Acute lower UTI 09/20/2017   Bradycardia on ECG 07/31/2011   Coronary artery disease    hyperlipidemia   Decreased GFR 09/23/2019   Dehydration 09/21/2017   Diabetes mellitus    Diabetes mellitus without complication (HCC)    Hypercholesterolemia    Hypertension    Hypothyroidism    Left arm pain 01/29/2020   Paronychia of great toe of left foot 09/26/2018   Paronychia of great toe, left 09/26/2018   PERIPHERAL NEUROPATHY 01/13/2006   Qualifier: Diagnosis of  By: Jen Mow MD, Christine     Shortness of breath dyspnea    with exertion   Vasovagal syncope    Weakness 09/20/2017    Medications Prior to Admission  Medication Sig Dispense Refill   aspirin EC 81 MG tablet Take 81 mg by mouth daily.      atorvastatin (LIPITOR) 80 MG tablet TAKE 1 TABLET AT BEDTIME. 90 tablet 1   BRILINTA 60 MG TABS tablet TAKE 1 TABLET TWICE DAILY 180 tablet 3   cetirizine (ZYRTEC) 5 MG tablet Take 1 tablet (5 mg total) by mouth daily. 90 tablet 1   ezetimibe (ZETIA) 10 MG tablet TAKE 1 TABLET EVERY DAY 90 tablet 1   Fish Oil-Vitamin D 1000-1000 MG-UNIT CAPS Take by mouth once. daily     furosemide (LASIX) 20 MG tablet Take 20 mg by mouth as needed.     gabapentin (NEURONTIN) 300 MG capsule Take 1 capsule (300 mg total) by mouth 3 (three) times daily. 270 capsule 1   Glucagon (GVOKE HYPOPEN 2-PACK) 1 MG/0.2ML SOAJ Inject 1 mg into the skin daily as needed. 0.4 mL 1   insulin aspart (NOVOLOG FLEXPEN) 100 UNIT/ML FlexPen Inject 10-16 Units into the skin 3  (three) times daily with meals. 30 mL 3   insulin degludec (TRESIBA FLEXTOUCH) 100 UNIT/ML FlexTouch Pen Inject 16 Units into the skin at bedtime. 15 mL 3   isosorbide mononitrate (IMDUR) 60 MG 24 hr tablet Take 1 tablet by mouth once daily 30 tablet 0   levothyroxine (SYNTHROID) 88 MCG tablet TAKE 1 TABLET EVERY DAY BEFORE BREAKFAST 90 tablet 3   metoprolol succinate (TOPROL-XL) 50 MG 24 hr tablet TAKE 1 TABLET EVERY DAY 90 tablet 3   spironolactone (ALDACTONE) 25 MG tablet Take 1 tablet (25 mg total) by mouth daily. 30 tablet 11   tamsulosin (FLOMAX) 0.4 MG CAPS capsule Take 1 capsule (0.4 mg total) by mouth at bedtime. 90 capsule 1   Continuous Blood Gluc Sensor (FREESTYLE LIBRE 2 SENSOR) MISC Use to check glucose at least 4 times daily 6 each 3   isosorbide mononitrate (IMDUR) 30 MG 24 hr tablet TAKE 1 TABLET EVERY DAY (Patient not taking: Reported on 08/21/2022) 90 tablet 3   triamcinolone cream (KENALOG) 0.1 % Apply 1 Application topically 2 (two) times daily. (Patient not taking: Reported on 08/21/2022) 15 g 1      aspirin EC  81 mg Oral Daily   atorvastatin  80 mg Oral QHS   enoxaparin (LOVENOX) injection  40 mg Subcutaneous Q24H   ezetimibe  10 mg Oral Daily   gabapentin  300 mg Oral TID   insulin aspart  0-5 Units Subcutaneous QHS   insulin aspart  0-9 Units Subcutaneous TID WC   insulin glargine-yfgn  12 Units Subcutaneous Daily   levothyroxine  88 mcg Oral Q0600   metoprolol succinate  50 mg Oral Daily   tamsulosin  0.4 mg Oral QHS   ticagrelor  60 mg Oral BID    Infusions:   Allergies  Allergen Reactions   Plavix [Clopidogrel] Hives    Social History   Socioeconomic History   Marital status: Married    Spouse name: Not on file   Number of children: Not on file   Years of education: Not on file   Highest education level: Not on file  Occupational History   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  Tobacco Use   Smoking status: Former   Smokeless tobacco:  Never  Advertising account planner   Vaping status: Never Used  Substance and Sexual Activity   Alcohol use: No   Drug use: No   Sexual activity: Not on file  Other Topics Concern   Not on file  Social History Narrative   ** Merged History Encounter **       Social Determinants of Health   Financial Resource Strain: Low Risk  (03/29/2021)   Overall Financial Resource Strain (CARDIA)    Difficulty of Paying Living Expenses: Not very hard  Food Insecurity: No Food Insecurity (08/20/2022)   Hunger Vital Sign    Worried About Running Out of Food in the Last Year: Never true    Ran Out of Food in the Last Year: Never true  Transportation Needs: No Transportation Needs (08/20/2022)   PRAPARE - Administrator, Civil Service (Medical): No    Lack of Transportation (Non-Medical): No  Physical Activity: Insufficiently Active (03/29/2021)   Exercise Vital Sign    Days of Exercise per Week: 7 days    Minutes of Exercise per Session: 20 min  Stress: No Stress Concern Present (03/29/2021)   [employer] of Occupational Health - Occupational Stress Questionnaire    Feeling of Stress : Not at all  Social Connections: Socially Integrated (03/29/2021)   Social Connection and Isolation Panel [NHANES]    Frequency of Communication with Friends and Family: More than three times a week    Frequency of Social Gatherings with Friends and Family: More than three times a week    Attends Religious Services: More than 4 times per year    Active Member of [employer] or Organizations: Yes    Attends Banker Meetings: 1 to 4 times per year    Marital Status: Married  Catering manager Violence: Not At Risk (08/20/2022)   Humiliation, Afraid, Rape, and Kick questionnaire    Fear of Current or Ex-Partner: No    Emotionally Abused: No    Physically Abused: No    Sexually Abused: No    Family History  Problem Relation Age of Onset   Diabetes Father    Hypertension Father    Cancer Mother     Diabetes Brother     PHYSICAL EXAM: Vitals:   08/21/22 0926 08/21/22 1216  BP:  (!) 164/73  Pulse: 62 (!) 58  Resp:  18  Temp:  98.4 F (36.9 C)  SpO2:  96%     Intake/Output Summary (Last 24 hours) at 08/21/2022 1325 Last data filed  at 08/21/2022 1000 Gross per 24 hour  Intake 740 ml  Output 350 ml  Net 390 ml    General:  Well appearing. No respiratory difficulty HEENT: normal Neck: supple. no JVD. Carotids 2+ bilat; no bruits. No lymphadenopathy or thryomegaly appreciated. Cor: PMI nondisplaced. Regular rate & rhythm. No rubs, gallops or murmurs. Lungs: clear Abdomen: soft, nontender, nondistended. No hepatosplenomegaly. No bruits or masses. Good bowel sounds. Extremities: no cyanosis, clubbing, rash, edema Neuro: alert & oriented x 3, cranial nerves grossly intact. moves all 4 extremities w/o difficulty. Affect pleasant.  ECG: Atrial fibrillation with controlled ventricular rate  Results for orders placed or performed during the hospital encounter of 08/20/22 (from the past 24 hour(s))  CBG monitoring, ED     Status: Abnormal   Collection Time: 08/20/22  7:28 PM  Result Value Ref Range   Glucose-Capillary 221 (H) 70 - 99 mg/dL  Brain natriuretic peptide     Status: Abnormal   Collection Time: 08/20/22  7:53 PM  Result Value Ref Range   B Natriuretic Peptide 439.8 (H) 0.0 - 100.0 pg/mL  TSH     Status: None   Collection Time: 08/20/22  7:53 PM  Result Value Ref Range   TSH 1.510 0.350 - 4.500 uIU/mL  Protime-INR     Status: None   Collection Time: 08/20/22  7:55 PM  Result Value Ref Range   Prothrombin Time 15.1 11.4 - 15.2 seconds   INR 1.2 0.8 - 1.2  APTT     Status: None   Collection Time: 08/20/22  7:55 PM  Result Value Ref Range   aPTT 33 24 - 36 seconds  CBC     Status: Abnormal   Collection Time: 08/20/22  7:55 PM  Result Value Ref Range   WBC 6.8 4.0 - 10.5 K/uL   RBC 3.46 (L) 4.22 - 5.81 MIL/uL   Hemoglobin 10.3 (L) 13.0 - 17.0 g/dL   HCT 27.2  (L) 53.6 - 52.0 %   MCV 91.9 80.0 - 100.0 fL   MCH 29.8 26.0 - 34.0 pg   MCHC 32.4 30.0 - 36.0 g/dL   RDW 64.4 03.4 - 74.2 %   Platelets 188 150 - 400 K/uL   nRBC 0.0 0.0 - 0.2 %  Differential     Status: None   Collection Time: 08/20/22  7:55 PM  Result Value Ref Range   Neutrophils Relative % 67 %   Neutro Abs 4.6 1.7 - 7.7 K/uL   Lymphocytes Relative 20 %   Lymphs Abs 1.4 0.7 - 4.0 K/uL   Monocytes Relative 9 %   Monocytes Absolute 0.6 0.1 - 1.0 K/uL   Eosinophils Relative 3 %   Eosinophils Absolute 0.2 0.0 - 0.5 K/uL   Basophils Relative 1 %   Basophils Absolute 0.0 0.0 - 0.1 K/uL   Immature Granulocytes 0 %   Abs Immature Granulocytes 0.01 0.00 - 0.07 K/uL  Comprehensive metabolic panel     Status: Abnormal   Collection Time: 08/20/22  7:55 PM  Result Value Ref Range   Sodium 135 135 - 145 mmol/L   Potassium 4.1 3.5 - 5.1 mmol/L   Chloride 103 98 - 111 mmol/L   CO2 22 22 - 32 mmol/L   Glucose, Bld 256 (H) 70 - 99 mg/dL   BUN 35 (H) 8 - 23 mg/dL   Creatinine, Ser 5.95 (H) 0.61 - 1.24 mg/dL   Calcium 8.7 (L) 8.9 - 10.3 mg/dL   Total Protein 6.4 (L)  6.5 - 8.1 g/dL   Albumin 3.3 (L) 3.5 - 5.0 g/dL   AST 24 15 - 41 U/L   ALT 28 0 - 44 U/L   Alkaline Phosphatase 95 38 - 126 U/L   Total Bilirubin 0.8 0.3 - 1.2 mg/dL   GFR, Estimated 31 (L) >60 mL/min   Anion gap 10 5 - 15  Ethanol     Status: None   Collection Time: 08/20/22  7:55 PM  Result Value Ref Range   Alcohol, Ethyl (B) <10 <10 mg/dL  Glucose, capillary     Status: Abnormal   Collection Time: 08/20/22 10:21 PM  Result Value Ref Range   Glucose-Capillary 237 (H) 70 - 99 mg/dL  Urinalysis, w/ Reflex to Culture (Infection Suspected) -Urine, Clean Catch     Status: Abnormal   Collection Time: 08/21/22  4:50 AM  Result Value Ref Range   Specimen Source URINE, CLEAN CATCH    Color, Urine YELLOW (A) YELLOW   APPearance CLEAR (A) CLEAR   Specific Gravity, Urine 1.029 1.005 - 1.030   pH 5.0 5.0 - 8.0   Glucose,  UA NEGATIVE NEGATIVE mg/dL   Hgb urine dipstick NEGATIVE NEGATIVE   Bilirubin Urine NEGATIVE NEGATIVE   Ketones, ur NEGATIVE NEGATIVE mg/dL   Protein, ur NEGATIVE NEGATIVE mg/dL   Nitrite NEGATIVE NEGATIVE   Leukocytes,Ua NEGATIVE NEGATIVE   WBC, UA 0-5 0 - 5 WBC/hpf   Bacteria, UA NONE SEEN NONE SEEN   Squamous Epithelial / HPF 0-5 0 - 5 /HPF  Lipid panel     Status: Abnormal   Collection Time: 08/21/22  5:02 AM  Result Value Ref Range   Cholesterol 126 0 - 200 mg/dL   Triglycerides 74 <478 mg/dL   HDL 34 (L) >29 mg/dL   Total CHOL/HDL Ratio 3.7 RATIO   VLDL 15 0 - 40 mg/dL   LDL Cholesterol 77 0 - 99 mg/dL  Glucose, capillary     Status: Abnormal   Collection Time: 08/21/22  8:06 AM  Result Value Ref Range   Glucose-Capillary 124 (H) 70 - 99 mg/dL  Glucose, capillary     Status: Abnormal   Collection Time: 08/21/22 12:17 PM  Result Value Ref Range   Glucose-Capillary 158 (H) 70 - 99 mg/dL   ECHOCARDIOGRAM COMPLETE  Result Date: 08/21/2022    ECHOCARDIOGRAM REPORT   Patient Name:   Dylan Robles Date of Exam: 08/21/2022 Medical Rec #:  [id number]     Height:       78.0 in Accession #:    [id number]    Weight:       237.2 lb Date of Birth:  Dec 17, 1940      BSA:          2.427 m Patient Age:    82 years      BP:           133/73 mmHg Patient Gender: M             HR:           57 bpm. Exam Location:  [facility] Procedure: 2D Echo, Cardiac Doppler, Color Doppler and Saline Contrast Bubble            Study Indications:     TIA G45.9  History:         Patient has prior history of Echocardiogram examinations, most                  recent 03/25/2020. CAD; Risk  Factors:Diabetes and Hypertension.  Sonographer:     Cristela Blue Referring Phys:  Wynona Neat NIU Diagnosing Phys: Adrian Blackwater IMPRESSIONS  1. Left ventricular ejection fraction, by estimation, is 55 to 60%. The left ventricle has normal function. The left ventricle has no regional wall motion abnormalities. Left ventricular diastolic parameters  were normal.  2. Right ventricular systolic function is normal. The right ventricular size is normal.  3. Left atrial size was mildly dilated.  4. Right atrial size was mildly dilated.  5. The mitral valve is normal in structure. Mild mitral valve regurgitation. No evidence of mitral stenosis.  6. The aortic valve is normal in structure. Aortic valve regurgitation is not visualized. Aortic valve sclerosis/calcification is present, without any evidence of aortic stenosis.  7. The inferior vena cava is normal in size with greater than 50% respiratory variability, suggesting right atrial pressure of 3 mmHg. FINDINGS  Left Ventricle: Left ventricular ejection fraction, by estimation, is 55 to 60%. The left ventricle has normal function. The left ventricle has no regional wall motion abnormalities. The left ventricular internal cavity size was normal in size. There is  no left ventricular hypertrophy. Left ventricular diastolic parameters were normal. Right Ventricle: The right ventricular size is normal. No increase in right ventricular wall thickness. Right ventricular systolic function is normal. Left Atrium: Left atrial size was mildly dilated. Right Atrium: Right atrial size was mildly dilated. Pericardium: There is no evidence of pericardial effusion. Mitral Valve: The mitral valve is normal in structure. Mild mitral valve regurgitation. No evidence of mitral valve stenosis. Tricuspid Valve: The tricuspid valve is normal in structure. Tricuspid valve regurgitation is mild . No evidence of tricuspid stenosis. Aortic Valve: The aortic valve is normal in structure. Aortic valve regurgitation is not visualized. Aortic valve sclerosis/calcification is present, without any evidence of aortic stenosis. Aortic valve mean gradient measures 3.0 mmHg. Aortic valve peak  gradient measures 5.0 mmHg. Aortic valve area, by VTI measures 2.24 cm. Pulmonic Valve: The pulmonic valve was normal in structure. Pulmonic valve  regurgitation is not visualized. No evidence of pulmonic stenosis. Aorta: The aortic root is normal in size and structure. Venous: The inferior vena cava is normal in size with greater than 50% respiratory variability, suggesting right atrial pressure of 3 mmHg. IAS/Shunts: No atrial level shunt detected by color flow Doppler. Agitated saline contrast was given intravenously to evaluate for intracardiac shunting.  LEFT VENTRICLE PLAX 2D LVIDd:         4.60 cm   Diastology LVIDs:         3.20 cm   LV e' medial:    8.27 cm/s LV PW:         1.20 cm   LV E/e' medial:  11.7 LV IVS:        0.90 cm   LV e' lateral:   13.50 cm/s LVOT diam:     2.10 cm   LV E/e' lateral: 7.2 LV SV:         41 LV SV Index:   17 LVOT Area:     3.46 cm  RIGHT VENTRICLE RV Basal diam:  4.00 cm RV Mid diam:    3.30 cm RV S prime:     11.60 cm/s TAPSE (M-mode): 2.0 cm LEFT ATRIUM           Index        RIGHT ATRIUM           Index LA diam:  5.00 cm 2.06 cm/m   RA Area:     17.40 cm LA Vol (A2C): 99.8 ml 41.12 ml/m  RA Volume:   49.30 ml  20.31 ml/m LA Vol (A4C): 40.0 ml 16.48 ml/m  AORTIC VALVE AV Area (Vmax):    2.10 cm AV Area (Vmean):   2.13 cm AV Area (VTI):     2.24 cm AV Vmax:           112.00 cm/s AV Vmean:          71.750 cm/s AV VTI:            0.184 m AV Peak Grad:      5.0 mmHg AV Mean Grad:      3.0 mmHg LVOT Vmax:         67.90 cm/s LVOT Vmean:        44.100 cm/s LVOT VTI:          0.119 m LVOT/AV VTI ratio: 0.65  AORTA Ao Root diam: 3.90 cm MITRAL VALVE               TRICUSPID VALVE MV Area (PHT): 5.16 cm    TR Peak grad:   11.6 mmHg MV Decel Time: 147 msec    TR Vmax:        170.00 cm/s MV E velocity: 96.90 cm/s MV A velocity: 55.50 cm/s  SHUNTS MV E/A ratio:  1.75        Systemic VTI:  0.12 m                            Systemic Diam: 2.10 cm Adrian Blackwater Electronically signed by Adrian Blackwater Signature Date/Time: 08/21/2022/11:21:14 AM    Final    MR BRAIN WO CONTRAST  Result Date: 08/20/2022 CLINICAL DATA:   Transient ischemic attack EXAM: MRI HEAD WITHOUT CONTRAST TECHNIQUE: Multiplanar, multiecho pulse sequences of the brain and surrounding structures were obtained without intravenous contrast. COMPARISON:  None Available. FINDINGS: Brain: Small focus of acute ischemia at posterior left insula. No acute or chronic hemorrhage. There is multifocal hyperintense T2-weighted signal within the white matter. Generalized volume loss. Multiple old small vessel infarcts of the deep white matter. The midline structures are normal. Vascular: Major flow voids are preserved. Skull and upper cervical spine: Normal calvarium and skull base. Visualized upper cervical spine and soft tissues are normal. Sinuses/Orbits:No paranasal sinus fluid levels or advanced mucosal thickening. No mastoid or middle ear effusion. Normal orbits. IMPRESSION: 1. Small focus of acute ischemia at posterior left insula. No hemorrhage or mass effect. 2. Multiple old small vessel infarcts of the deep white matter. Electronically Signed   By: Deatra Robinson M.D.   On: 08/20/2022 22:21   CT ANGIO HEAD NECK W WO CM (CODE STROKE)  Result Date: 08/20/2022 CLINICAL DATA:  Stroke follow-up EXAM: CT ANGIOGRAPHY HEAD AND NECK WITH AND WITHOUT CONTRAST TECHNIQUE: Multidetector CT imaging of the head and neck was performed using the standard protocol during bolus administration of intravenous contrast. Multiplanar CT image reconstructions and MIPs were obtained to evaluate the vascular anatomy. Carotid stenosis measurements (when applicable) are obtained utilizing NASCET criteria, using the distal internal carotid diameter as the denominator. RADIATION DOSE REDUCTION: This exam was performed according to the departmental dose-optimization program which includes automated exposure control, adjustment of the mA and/or kV according to patient size and/or use of iterative reconstruction technique. CONTRAST:  75mL OMNIPAQUE IOHEXOL 350 MG/ML SOLN COMPARISON:  08/20/2022  FINDINGS: CTA NECK FINDINGS SKELETON: There is no bony spinal canal stenosis. No lytic or blastic lesion. OTHER NECK: Normal pharynx, larynx and major salivary glands. No cervical lymphadenopathy. Unremarkable thyroid gland. UPPER CHEST: No pneumothorax or pleural effusion. No nodules or masses. AORTIC ARCH: There is calcific atherosclerosis of the aortic arch. Conventional 3 vessel aortic branching pattern. RIGHT CAROTID SYSTEM: No dissection, occlusion or aneurysm. There is mixed density atherosclerosis extending into the proximal ICA, resulting in 50% stenosis. LEFT CAROTID SYSTEM: No dissection, occlusion or aneurysm. Mild atherosclerotic calcification at the carotid bifurcation without hemodynamically significant stenosis. VERTEBRAL ARTERIES: Right dominant configuration.There is no dissection, occlusion or flow-limiting stenosis to the skull base (V1-V3 segments). CTA HEAD FINDINGS POSTERIOR CIRCULATION: --Vertebral arteries: Severe stenosis of the left V4 segment over length of approximately 5 mm (series 8 image 151. Mild atherosclerosis of the right V4 segment without stenosis. --Inferior cerebellar arteries: Normal. --Basilar artery: Diminutive basilar artery --Superior cerebellar arteries: Normal. --Posterior cerebral arteries (PCA): Normal. ANTERIOR CIRCULATION: --Intracranial internal carotid arteries: Normal. --Anterior cerebral arteries (ACA): Normal. Both A1 segments are present. Patent anterior communicating artery (a-comm). --Middle cerebral arteries (MCA): Normal. VENOUS SINUSES: As permitted by contrast timing, patent. ANATOMIC VARIANTS: None Review of the MIP images confirms the above findings. IMPRESSION: 1. No emergent large vessel occlusion. 2. Severe stenosis of the left vertebral artery V4 segment over a length of approximately 5 mm. 3. A 50% stenosis of the proximal right internal carotid artery secondary to mixed density atherosclerosis. Aortic Atherosclerosis (ICD10-I70.0). Electronically  Signed   By: Deatra Robinson M.D.   On: 08/20/2022 20:03   CT HEAD CODE STROKE WO CONTRAST  Result Date: 08/20/2022 CLINICAL DATA:  Code stroke.  Slurred speech EXAM: CT HEAD WITHOUT CONTRAST TECHNIQUE: Contiguous axial images were obtained from the base of the skull through the vertex without intravenous contrast. RADIATION DOSE REDUCTION: This exam was performed according to the departmental dose-optimization program which includes automated exposure control, adjustment of the mA and/or kV according to patient size and/or use of iterative reconstruction technique. COMPARISON:  None Available. FINDINGS: Brain: There is no mass, hemorrhage or extra-axial collection. There is generalized atrophy without lobar predilection. There is hypoattenuation of the periventricular white matter, most commonly indicating chronic ischemic microangiopathy. Old right basal ganglia small vessel infarct. Vascular: Atherosclerotic calcification of the vertebral and internal carotid arteries at the skull base. No abnormal hyperdensity of the major intracranial arteries or dural venous sinuses. Skull: The visualized skull base, calvarium and extracranial soft tissues are normal. Sinuses/Orbits: No fluid levels or advanced mucosal thickening of the visualized paranasal sinuses. No mastoid or middle ear effusion. The orbits are normal. ASPECTS [facility] Stroke Program Early CT Score) - Ganglionic level infarction (caudate, lentiform nuclei, internal capsule, insula, M1-M3 cortex): 7 - Supraganglionic infarction (M4-M6 cortex): 3 Total score (0-10 with 10 being normal): 10 IMPRESSION: 1. No acute intracranial abnormality. 2. ASPECTS is 10. These results were called by telephone at the time of interpretation on 08/20/2022 at 7:44 pm to provider [facility] , who verbally acknowledged these results. Electronically Signed   By: Deatra Robinson M.D.   On: 08/20/2022 19:44     ASSESSMENT AND PLAN: New onset atrial fibrillation with ischemic  stroke with right-sided weakness.  Echocardiogram showed normal left ventricular systolic function with no evidence of any thrombi.  Advise anticoagulation and rate control for now.  Will eventually try to convert the patient to sinus rhythm.   Welton Flakes

## 2022-08-21 NOTE — Evaluation (Signed)
Occupational Therapy Evaluation Patient Details Name: Dylan Robles MRN: [id number] DOB: 31-Dec-1940 Today's Date: 08/21/2022   History of Present Illness 82 y/o male presented to ED on 08/20/22 for R sided weakness and difficulty speaking. CT negative. MRI showed small focus of acute ischemia at posterior left insula. PMH: CAD, HTN, HLD, dCHF, BPH, CKD-3A, peripheral neuropathy.   Clinical Impression   Patient received for OT evaluation. See flowsheet below for details of function. Generally, patient requiring MIN A for bed mobility, MIN A-MOD A with RW for functional mobility, and CGA-MAX A for ADLs. Requires significant assist for mobility/standing due to R lean, worsened when BIL hands involved in functional activity and therefore pt without UE support. Pt appears to have decreased insight into deficits.  Patient will benefit from continued OT while in acute care.       If plan is discharge home, recommend the following: A lot of help with walking and/or transfers;A lot of help with bathing/dressing/bathroom;Assistance with cooking/housework;Direct supervision/assist for medications management;Direct supervision/assist for financial management;Assist for transportation;Help with stairs or ramp for entrance;Supervision due to cognitive status    Functional Status Assessment  Patient has had a recent decline in their functional status and demonstrates the ability to make significant improvements in function in a reasonable and predictable amount of time.  Equipment Recommendations  Other (comment) (defer to next venue of care)    Recommendations for Other Services       Precautions / Restrictions Precautions Precautions: Fall Precaution Comments: mild R inattention Restrictions Weight Bearing Restrictions: No      Mobility Bed Mobility Overal bed mobility: Needs Assistance Bed Mobility: Supine to Sit, Sit to Supine     Supine to sit: Min assist, HOB elevated Sit to supine:  Contact guard assist, HOB elevated        Transfers Overall transfer level: Needs assistance Equipment used: Rolling walker (2 wheels) Transfers: Sit to/from Stand Sit to Stand: Min assist, From elevated surface           General transfer comment: RW and gait belt utilized; significant cues for hand placement. Pt with decreased awareness of R hand placement.      Balance Overall balance assessment: Needs assistance Sitting-balance support: No upper extremity supported, Feet supported Sitting balance-Leahy Scale: Good     Standing balance support: No upper extremity supported, During functional activity Standing balance-Leahy Scale: Poor Standing balance comment: while washing hands, very strong R lean; corrected with MAX A of OT.                           ADL either performed or assessed with clinical judgement   ADL Overall ADL's : Needs assistance/impaired     Grooming: Wash/dry hands;Moderate assistance;Standing;Maximal assistance Grooming Details (indicate cue type and reason): MOD-MAX A for standing balance; pt able to walk to sink with RW MIN A, but progressively more assistance 2/2 strong R lean while washing hands. Pt appears to have decreased awareness.               Lower Body Dressing Details (indicate cue type and reason): anticipate MOD-MAX A 2/2 standing balance deficits. Pt is able to lean forward and touch toes with BIL hands while seated at EOB.             Functional mobility during ADLs: Minimal assistance;Rolling walker (2 wheels) (with gait belt for safety.) General ADL Comments: Pt will require significant assist for all standing ADLs 2/2  strong R lean and very high fall risk.     Vision Patient Visual Report: Other (comment) (unclear if pt is having R visual deficits; difficulty with communication.)       Perception         Praxis         Pertinent Vitals/Pain Pain Assessment Pain Assessment: No/denies pain      Extremity/Trunk Assessment Upper Extremity Assessment Upper Extremity Assessment: RUE deficits/detail RUE Deficits / Details: slight weakness noted on RUE; pt also with difficulty following instructions for some coordination testing and difficulty answering question about sensation. Able to do finger-nose bilaterally; coordination decreased bilaterally; once switched to LUE finger-nose pt began touching finger to forehead, but able to then touch nose when instruction repeated.   Lower Extremity Assessment Lower Extremity Assessment: Defer to PT evaluation RLE Deficits / Details: grossly 3+/5. Functionally weak with mild R inattention   Cervical / Trunk Assessment Cervical / Trunk Assessment: Kyphotic   Communication Communication Communication: Difficulty communicating thoughts/reduced clarity of speech Cueing Techniques: Verbal cues;Visual cues   Cognition Arousal: Alert Behavior During Therapy: Flat affect Overall Cognitive Status: Impaired/Different from baseline Area of Impairment: Awareness, Problem solving, Safety/judgement, Following commands, Attention                   Current Attention Level: Sustained   Following Commands: Follows one step commands with increased time Safety/Judgement: Decreased awareness of deficits, Decreased awareness of safety Awareness: Emergent Problem Solving: Slow processing, Requires verbal cues General Comments: Pt is unable to state the date; when asked the month, also unable to state. When asked if it is "August, September, or October" pt able to correctly answer that it is August with increased time. Appearing to have expressive language deficit and possible receptive language deficit; pending SLP evaluation.     General Comments  Pt on room air    Exercises     Shoulder Instructions      Home Living Family/patient expects to be discharged to:: Private residence Living Arrangements: Spouse/significant other Available Help  at Discharge: Family;Available 24 hours/day Type of Home: House Home Access: Ramped entrance     Home Layout: One level     Bathroom Shower/Tub: Walk-in shower         Home Equipment: Agricultural consultant (2 wheels);Cane - single point;Shower seat          Prior Functioning/Environment Prior Level of Function : Independent/Modified Independent             Mobility Comments: ind with no AD ADLs Comments: (I) ADLs, driving, mowing the lawn on riding mower. Wife does most cooking, Pharmacologist, groceries for the household.        OT Problem List: Decreased strength;Impaired balance (sitting and/or standing);Decreased coordination;Decreased safety awareness;Decreased knowledge of use of DME or AE;Decreased knowledge of precautions      OT Treatment/Interventions: Self-care/ADL training;Therapeutic exercise;Neuromuscular education;DME and/or AE instruction;Therapeutic activities;Patient/family education    OT Goals(Current goals can be found in the care plan section) Acute Rehab OT Goals Patient Stated Goal: Get better OT Goal Formulation: With family Time For Goal Achievement: 09/04/22 Potential to Achieve Goals: Good ADL Goals Pt Will Perform Grooming: with min assist;standing Pt Will Perform Lower Body Dressing: with min assist;sit to/from stand Pt Will Transfer to Toilet: with min assist;ambulating;regular height toilet Pt Will Perform Toileting - Clothing Manipulation and hygiene: with contact guard assist;sit to/from stand  OT Frequency: Min 1X/week    Co-evaluation  AM-PAC OT "6 Clicks" Daily Activity     Outcome Measure Help from another person eating meals?: None Help from another person taking care of personal grooming?: A Lot Help from another person toileting, which includes using toliet, bedpan, or urinal?: A Lot Help from another person bathing (including washing, rinsing, drying)?: A Lot Help from another person to put on and taking off regular  upper body clothing?: A Little Help from another person to put on and taking off regular lower body clothing?: A Lot 6 Click Score: 15   End of Session Equipment Utilized During Treatment: Gait belt;Rolling walker (2 wheels) Nurse Communication: Mobility status  Activity Tolerance: Patient tolerated treatment well Patient left: in bed;with call bell/phone within reach;with bed alarm set;with family/visitor present (four family members in room during session)  OT Visit Diagnosis: Unsteadiness on feet (R26.81);Ataxia, unspecified (R27.0);Other abnormalities of gait and mobility (R26.89)                Time: 1117-1140 OT Time Calculation (min): 23 min Charges:  OT General Charges $OT Visit: 1 Visit OT Evaluation $OT Eval Moderate Complexity: 1 Mod OT Treatments $Self Care/Home Management : 8-22 mins  Linward Foster, MS, OTR/L  Alvester Morin 08/21/2022, 12:51 PM

## 2022-08-22 ENCOUNTER — Ambulatory Visit: Payer: Medicare HMO | Admitting: Nurse Practitioner

## 2022-08-22 ENCOUNTER — Other Ambulatory Visit (HOSPITAL_COMMUNITY): Payer: Self-pay

## 2022-08-22 DIAGNOSIS — I4891 Unspecified atrial fibrillation: Secondary | ICD-10-CM | POA: Diagnosis not present

## 2022-08-22 DIAGNOSIS — I5032 Chronic diastolic (congestive) heart failure: Secondary | ICD-10-CM | POA: Diagnosis not present

## 2022-08-22 DIAGNOSIS — G459 Transient cerebral ischemic attack, unspecified: Secondary | ICD-10-CM | POA: Diagnosis not present

## 2022-08-22 DIAGNOSIS — G9341 Metabolic encephalopathy: Secondary | ICD-10-CM | POA: Diagnosis not present

## 2022-08-22 DIAGNOSIS — I639 Cerebral infarction, unspecified: Secondary | ICD-10-CM | POA: Diagnosis not present

## 2022-08-22 LAB — HEPARIN LEVEL (UNFRACTIONATED): Heparin Unfractionated: 0.11 IU/mL — ABNORMAL LOW (ref 0.30–0.70)

## 2022-08-22 LAB — CREATININE, SERUM
Creatinine, Ser: 1.68 mg/dL — ABNORMAL HIGH (ref 0.61–1.24)
GFR, Estimated: 40 mL/min — ABNORMAL LOW (ref >60)

## 2022-08-22 LAB — GLUCOSE, CAPILLARY
Glucose-Capillary: 105 mg/dL — ABNORMAL HIGH (ref 70–99)
Glucose-Capillary: 177 mg/dL — ABNORMAL HIGH (ref 70–99)
Glucose-Capillary: 201 mg/dL — ABNORMAL HIGH (ref 70–99)
Glucose-Capillary: 132 mg/dL — ABNORMAL HIGH (ref 70–99)

## 2022-08-22 MED ORDER — SPIRONOLACTONE 25 MG PO TABS
25.0000 mg | ORAL_TABLET | Freq: Every day | ORAL | Status: DC
  Administered 2022-08-22 – 2022-08-23 (×2): 25 mg via ORAL
  Filled 2022-08-22 (×2): qty 1

## 2022-08-22 MED ORDER — APIXABAN 2.5 MG PO TABS
2.5000 mg | ORAL_TABLET | Freq: Two times a day (BID) | ORAL | Status: DC
  Administered 2022-08-22 – 2022-08-23 (×3): 2.5 mg via ORAL
  Filled 2022-08-22 (×3): qty 1

## 2022-08-22 MED ORDER — LORAZEPAM 2 MG/ML IJ SOLN: 2.0000 mg | Freq: Once | INTRAMUSCULAR | Status: DC | PRN

## 2022-08-22 MED ORDER — ISOSORBIDE MONONITRATE ER 30 MG PO TB24
60.0000 mg | ORAL_TABLET | Freq: Every day | ORAL | Status: DC
  Administered 2022-08-22 – 2022-08-23 (×2): 60 mg via ORAL
  Filled 2022-08-22 (×2): qty 2

## 2022-08-22 NOTE — TOC Benefit Eligibility Note (Signed)
Patient Product/process development scientist completed.    The patient is insured through [employer]. Patient has Medicare and is not eligible for a copay card, but may be able to apply for patient assistance, if available.    Ran test claim for Eliquis 2.5 mg and the current 30 day co-pay is $45.00.   This test claim was processed through [facility]- copay amounts may vary at other pharmacies due to pharmacy/plan contracts, or as the patient moves through the different stages of their insurance plan.     Roland Earl, CPHT Pharmacy Patient Advocate Specialist [facility] Patient Advocate Team Direct Number: [phone]  Fax: [phone]

## 2022-08-22 NOTE — Progress Notes (Signed)
[facility]  - [place] at [facility]   PATIENT NAME: Dylan Robles    MR#:  [id number]  DATE OF BIRTH:  1940-10-31  SUBJECTIVE:  patient's wife and friend at bedside. Speech much improved. Weakness improved and facial drool resolved. Much better spirit today.   VITALS:  Blood pressure 137/81, pulse (!) 51, temperature 98.3 F (36.8 C), resp. rate 18, height 6\' 6"  (1.981 m), weight 107.6 kg, SpO2 96%.  PHYSICAL EXAMINATION:   GENERAL:  82 y.o.-year-old patient with no acute distress.  LUNGS: Normal breath sounds bilaterally, no wheezing CARDIOVASCULAR: S1, S2 normal. No murmur   ABDOMEN: Soft, nontender, nondistended. Bowel sounds present.  EXTREMITIES: No  edema b/l.    NEUROLOGIC: nonfocal  patient is alert and awake speech clear. Right UE /LE weakness improving SKIN: No obvious rash, lesion, or ulcer.   LABORATORY PANEL:  CBC Recent Labs  Lab 08/22/22 0528  WBC 8.2  HGB 10.6*  HCT 31.2*  PLT 190    Chemistries  Recent Labs  Lab 08/20/22 1955  NA 135  K 4.1  CL 103  CO2 22  GLUCOSE 256*  BUN 35*  CREATININE 2.09*  CALCIUM 8.7*  AST 24  ALT 28  ALKPHOS 95  BILITOT 0.8   Cardiac Enzymes No results for input(s): "TROPONINI" in the last 168 hours. RADIOLOGY:  EEG adult  Result Date: 08/21/2022 Rejeana Brock, MD     08/22/2022  8:23 AM History: 82 yo M with transient episode of aphasia Sedation: None Technique: This EEG was acquired with electrodes placed according to the International 10-20 electrode system (including Fp1, Fp2, F3, F4, C3, C4, P3, P4, O1, O2, T3, T4, T5, T6, A1, A2, Fz, Cz, Pz). The following electrodes were missing or displaced: none. Background: The background consists of intermixed alpha and beta activities. There is a well defined posterior dominant rhythm of 11 Hz that attenuates with eye opening. Sleep is not recorded but there is anterior shifting of the PDR and increase in slow activity associated with drowsiness.  Photic stimulation: Physiologic driving is not performed EEG Abnormalities: none Clinical Interpretation: This normal EEG is recorded in the waking and drowsy state. There was no seizure or seizure predisposition recorded on this study. Please note that lack of epileptiform activity on EEG does not preclude the possibility of epilepsy. Ritta Slot, MD [facility] [phone] If 7pm- 7am, please page neurology on call as listed in AMION.  ECHOCARDIOGRAM COMPLETE  Result Date: 08/21/2022    ECHOCARDIOGRAM REPORT   Patient Name:   Dylan Robles Date of Exam: 08/21/2022 Medical Rec #:  [id number]     Height:       78.0 in Accession #:    [id number]    Weight:       237.2 lb Date of Birth:  1940/07/25      BSA:          2.427 m Patient Age:    82 years      BP:           133/73 mmHg Patient Gender: M             HR:           57 bpm. Exam Location:  [facility] Procedure: 2D Echo, Cardiac Doppler, Color Doppler and Saline Contrast Bubble            Study Indications:     TIA G45.9  History:         Patient  has prior history of Echocardiogram examinations, most                  recent 03/25/2020. CAD; Risk Factors:Diabetes and Hypertension.  Sonographer:     Cristela Blue Referring Phys:  Wynona Neat NIU Diagnosing Phys: Dylan Robles IMPRESSIONS  1. Left ventricular ejection fraction, by estimation, is 55 to 60%. The left ventricle has normal function. The left ventricle has no regional wall motion abnormalities. Left ventricular diastolic parameters were normal.  2. Right ventricular systolic function is normal. The right ventricular size is normal.  3. Left atrial size was mildly dilated.  4. Right atrial size was mildly dilated.  5. The mitral valve is normal in structure. Mild mitral valve regurgitation. No evidence of mitral stenosis.  6. The aortic valve is normal in structure. Aortic valve regurgitation is not visualized. Aortic valve sclerosis/calcification is present, without any evidence of aortic  stenosis.  7. The inferior vena cava is normal in size with greater than 50% respiratory variability, suggesting right atrial pressure of 3 mmHg. FINDINGS  Left Ventricle: Left ventricular ejection fraction, by estimation, is 55 to 60%. The left ventricle has normal function. The left ventricle has no regional wall motion abnormalities. The left ventricular internal cavity size was normal in size. There is  no left ventricular hypertrophy. Left ventricular diastolic parameters were normal. Right Ventricle: The right ventricular size is normal. No increase in right ventricular wall thickness. Right ventricular systolic function is normal. Left Atrium: Left atrial size was mildly dilated. Right Atrium: Right atrial size was mildly dilated. Pericardium: There is no evidence of pericardial effusion. Mitral Valve: The mitral valve is normal in structure. Mild mitral valve regurgitation. No evidence of mitral valve stenosis. Tricuspid Valve: The tricuspid valve is normal in structure. Tricuspid valve regurgitation is mild . No evidence of tricuspid stenosis. Aortic Valve: The aortic valve is normal in structure. Aortic valve regurgitation is not visualized. Aortic valve sclerosis/calcification is present, without any evidence of aortic stenosis. Aortic valve mean gradient measures 3.0 mmHg. Aortic valve peak  gradient measures 5.0 mmHg. Aortic valve area, by VTI measures 2.24 cm. Pulmonic Valve: The pulmonic valve was normal in structure. Pulmonic valve regurgitation is not visualized. No evidence of pulmonic stenosis. Aorta: The aortic root is normal in size and structure. Venous: The inferior vena cava is normal in size with greater than 50% respiratory variability, suggesting right atrial pressure of 3 mmHg. IAS/Shunts: No atrial level shunt detected by color flow Doppler. Agitated saline contrast was given intravenously to evaluate for intracardiac shunting.  LEFT VENTRICLE PLAX 2D LVIDd:         4.60 cm   Diastology  LVIDs:         3.20 cm   LV e' medial:    8.27 cm/s LV PW:         1.20 cm   LV E/e' medial:  11.7 LV IVS:        0.90 cm   LV e' lateral:   13.50 cm/s LVOT diam:     2.10 cm   LV E/e' lateral: 7.2 LV SV:         41 LV SV Index:   17 LVOT Area:     3.46 cm  RIGHT VENTRICLE RV Basal diam:  4.00 cm RV Mid diam:    3.30 cm RV S prime:     11.60 cm/s TAPSE (M-mode): 2.0 cm LEFT ATRIUM  Index        RIGHT ATRIUM           Index LA diam:      5.00 cm 2.06 cm/m   RA Area:     17.40 cm LA Vol (A2C): 99.8 ml 41.12 ml/m  RA Volume:   49.30 ml  20.31 ml/m LA Vol (A4C): 40.0 ml 16.48 ml/m  AORTIC VALVE AV Area (Vmax):    2.10 cm AV Area (Vmean):   2.13 cm AV Area (VTI):     2.24 cm AV Vmax:           112.00 cm/s AV Vmean:          71.750 cm/s AV VTI:            0.184 m AV Peak Grad:      5.0 mmHg AV Mean Grad:      3.0 mmHg LVOT Vmax:         67.90 cm/s LVOT Vmean:        44.100 cm/s LVOT VTI:          0.119 m LVOT/AV VTI ratio: 0.65  AORTA Ao Root diam: 3.90 cm MITRAL VALVE               TRICUSPID VALVE MV Area (PHT): 5.16 cm    TR Peak grad:   11.6 mmHg MV Decel Time: 147 msec    TR Vmax:        170.00 cm/s MV E velocity: 96.90 cm/s MV A velocity: 55.50 cm/s  SHUNTS MV E/A ratio:  1.75        Systemic VTI:  0.12 m                            Systemic Diam: 2.10 cm Dylan Robles Electronically signed by Dylan Robles Signature Date/Time: 08/21/2022/11:21:14 AM    Final    MR BRAIN WO CONTRAST  Result Date: 08/20/2022 CLINICAL DATA:  Transient ischemic attack EXAM: MRI HEAD WITHOUT CONTRAST TECHNIQUE: Multiplanar, multiecho pulse sequences of the brain and surrounding structures were obtained without intravenous contrast. COMPARISON:  None Available. FINDINGS: Brain: Small focus of acute ischemia at posterior left insula. No acute or chronic hemorrhage. There is multifocal hyperintense T2-weighted signal within the white matter. Generalized volume loss. Multiple old small vessel infarcts of the deep white  matter. The midline structures are normal. Vascular: Major flow voids are preserved. Skull and upper cervical spine: Normal calvarium and skull base. Visualized upper cervical spine and soft tissues are normal. Sinuses/Orbits:No paranasal sinus fluid levels or advanced mucosal thickening. No mastoid or middle ear effusion. Normal orbits. IMPRESSION: 1. Small focus of acute ischemia at posterior left insula. No hemorrhage or mass effect. 2. Multiple old small vessel infarcts of the deep white matter. Electronically Signed   By: Deatra Robinson M.D.   On: 08/20/2022 22:21   CT ANGIO HEAD NECK W WO CM (CODE STROKE)  Result Date: 08/20/2022 CLINICAL DATA:  Stroke follow-up EXAM: CT ANGIOGRAPHY HEAD AND NECK WITH AND WITHOUT CONTRAST TECHNIQUE: Multidetector CT imaging of the head and neck was performed using the standard protocol during bolus administration of intravenous contrast. Multiplanar CT image reconstructions and MIPs were obtained to evaluate the vascular anatomy. Carotid stenosis measurements (when applicable) are obtained utilizing NASCET criteria, using the distal internal carotid diameter as the denominator. RADIATION DOSE REDUCTION: This exam was performed according to the departmental dose-optimization program which includes automated exposure control,  adjustment of the mA and/or kV according to patient size and/or use of iterative reconstruction technique. CONTRAST:  75mL OMNIPAQUE IOHEXOL 350 MG/ML SOLN COMPARISON:  08/20/2022 FINDINGS: CTA NECK FINDINGS SKELETON: There is no bony spinal canal stenosis. No lytic or blastic lesion. OTHER NECK: Normal pharynx, larynx and major salivary glands. No cervical lymphadenopathy. Unremarkable thyroid gland. UPPER CHEST: No pneumothorax or pleural effusion. No nodules or masses. AORTIC ARCH: There is calcific atherosclerosis of the aortic arch. Conventional 3 vessel aortic branching pattern. RIGHT CAROTID SYSTEM: No dissection, occlusion or aneurysm. There is  mixed density atherosclerosis extending into the proximal ICA, resulting in 50% stenosis. LEFT CAROTID SYSTEM: No dissection, occlusion or aneurysm. Mild atherosclerotic calcification at the carotid bifurcation without hemodynamically significant stenosis. VERTEBRAL ARTERIES: Right dominant configuration.There is no dissection, occlusion or flow-limiting stenosis to the skull base (V1-V3 segments). CTA HEAD FINDINGS POSTERIOR CIRCULATION: --Vertebral arteries: Severe stenosis of the left V4 segment over length of approximately 5 mm (series 8 image 151. Mild atherosclerosis of the right V4 segment without stenosis. --Inferior cerebellar arteries: Normal. --Basilar artery: Diminutive basilar artery --Superior cerebellar arteries: Normal. --Posterior cerebral arteries (PCA): Normal. ANTERIOR CIRCULATION: --Intracranial internal carotid arteries: Normal. --Anterior cerebral arteries (ACA): Normal. Both A1 segments are present. Patent anterior communicating artery (a-comm). --Middle cerebral arteries (MCA): Normal. VENOUS SINUSES: As permitted by contrast timing, patent. ANATOMIC VARIANTS: None Review of the MIP images confirms the above findings. IMPRESSION: 1. No emergent large vessel occlusion. 2. Severe stenosis of the left vertebral artery V4 segment over a length of approximately 5 mm. 3. A 50% stenosis of the proximal right internal carotid artery secondary to mixed density atherosclerosis. Aortic Atherosclerosis (ICD10-I70.0). Electronically Signed   By: Deatra Robinson M.D.   On: 08/20/2022 20:03   CT HEAD CODE STROKE WO CONTRAST  Result Date: 08/20/2022 CLINICAL DATA:  Code stroke.  Slurred speech EXAM: CT HEAD WITHOUT CONTRAST TECHNIQUE: Contiguous axial images were obtained from the base of the skull through the vertex without intravenous contrast. RADIATION DOSE REDUCTION: This exam was performed according to the departmental dose-optimization program which includes automated exposure control, adjustment  of the mA and/or kV according to patient size and/or use of iterative reconstruction technique. COMPARISON:  None Available. FINDINGS: Brain: There is no mass, hemorrhage or extra-axial collection. There is generalized atrophy without lobar predilection. There is hypoattenuation of the periventricular white matter, most commonly indicating chronic ischemic microangiopathy. Old right basal ganglia small vessel infarct. Vascular: Atherosclerotic calcification of the vertebral and internal carotid arteries at the skull base. No abnormal hyperdensity of the major intracranial arteries or dural venous sinuses. Skull: The visualized skull base, calvarium and extracranial soft tissues are normal. Sinuses/Orbits: No fluid levels or advanced mucosal thickening of the visualized paranasal sinuses. No mastoid or middle ear effusion. The orbits are normal. ASPECTS [facility] Stroke Program Early CT Score) - Ganglionic level infarction (caudate, lentiform nuclei, internal capsule, insula, M1-M3 cortex): 7 - Supraganglionic infarction (M4-M6 cortex): 3 Total score (0-10 with 10 being normal): 10 IMPRESSION: 1. No acute intracranial abnormality. 2. ASPECTS is 10. These results were called by telephone at the time of interpretation on 08/20/2022 at 7:44 pm to provider [facility] , who verbally acknowledged these results. Electronically Signed   By: Deatra Robinson M.D.   On: 08/20/2022 19:44    Assessment and Plan   Dylan Robles is a 82 y.o. male with medical history significant of CAD, HTN, HLD, CAD, dCHF, hypothyroidism, BPH, CKD-3A, peripheral neuropathy, former smoker, who  presents with AMS, confusion, right-sided weakness, difficulty speaking   CTA of head and neck: 1. No emergent large vessel occlusion. 2. Severe stenosis of the left vertebral artery V4 segment over a length of approximately 5 mm. 3. A 50% stenosis of the proximal right internal carotid artery secondary to mixed density atherosclerosis.  MRI  brain Small focus of acute ischemia at posterior left insula. No hemorrhage or mass effect. 2. Multiple old small vessel infarcts of the deep white matter.   Acute CVA left post insula -- Patient's symptoms are concerning for va vs  TIA.  --  Patient is found to have new onset atrial fibrillation which is likely the etiology.  --CTA of head and neck negative for LVO.   --Consulted Dr. Sandrea Hammond of neurology--low dose heparin gtt for now. Discussion about DAPT and consideration of DOAC - will continue home ASA and Brilinta  - Statin: lipitor - 2D transthoracic echocardiography  - PT/OT consult noted--acute inpatient rehab -Seizure precaution -d/w dr Amada Jupiter and Dr Sonnie Alamo to start po eliquis and d/c asa + brilinta (home med). D/c heparin gtt   New onset atrial fibrillation (HCC): HR is 70s.  CHADS2 score is 8, will need chronic anticoagulants. -continue home metoprolol -Consulted Dr Wynelle Link khan--recommends DOAC   Chronic diastolic CHF (congestive heart failure) (HCC): 2D echo on 03/25/2020 showed EF 68.5% with grade 1 diastolic dysfunction.  Patient has mild shortness of breath and trace leg edema, no oxygen desaturation, no JVD, clinically does not seem to have CHF exacerbation. -Hold diuretics: Lasix (takes prn)   Acute metabolic encephalopathy: Most likely due to cva -Fall precaution --mentation much improved  Hypothyroidism -Synthroid   Essential hypertension - prn IV hydralazine for SBP>220 or dBP>110 --Continue metoprolol for rate control of A-fib --resumed imdur and spironolactone   Hyperlipidemia -Lipitor and Zetia   Type II diabetes mellitus with renal manifestations (HCC): Recent A1c 10.2, poorly controlled.  Patient is taking NovoLog and Tresiba 16 unit daily -Sliding scale insulin +lantus  Acute renal failure superimposed on stage 3a chronic kidney disease (HCC): Likely due to dehydration and continuation for diuretics -Patient received 500 cc normal  saline --baseline creat 1.42-- 1.56 --came in with creat 2.06--S creat pending today   BPH (benign prostatic hyperplasia) -Flomax   CAD: no CP. S/p of PCI, no stent placement per his wife - stable    pt is medically improved. Await inpatient rehab for discharge  Family communication :wife in the room Consults : neurology, cardiology CODE STATUS: full DVT Prophylaxis : eliquis Level of care: Telemetry Medical Status is: Inpatient Remains inpatient appropriate because: cva    TOTAL TIME TAKING CARE OF THIS PATIENT: 35 minutes.  >50% time spent on counselling and coordination of care  Note: This dictation was prepared with Dragon dictation along with smaller phrase technology. Any transcriptional errors that result from this process are unintentional.  Enedina Finner M.D    [facility]   CC: Primary care physician; Billie Lade, MD

## 2022-08-22 NOTE — Consult Note (Signed)
ANTICOAGULATION CONSULT NOTE - Initial Consult  Pharmacy Consult for Heparin Infusion Indication: new AF, TIA   Allergies  Allergen Reactions   Plavix [Clopidogrel] Hives    Patient Measurements: Height: 6\' 6"  (198.1 cm) Weight: 107.6 kg (237 lb 3.4 oz) IBW/kg (Calculated) : 91.4 Heparin Dosing Weight: 107.6  Vital Signs: Temp: 98.8 F (37.1 C) (08/13 0609) Temp Source: Oral (08/13 0609) BP: 125/85 (08/13 0609) Pulse Rate: 59 (08/13 0609)  Labs: Recent Labs    08/20/22 1955 08/21/22 1632 08/21/22 2056 08/22/22 0528  HGB 10.3* 11.3*  --  10.6*  HCT 31.8* 35.0*  --  31.2*  PLT 188 202  --  190  APTT 33 35  --   --   LABPROT 15.1 14.5  --   --   INR 1.2 1.1  --   --   HEPARINUNFRC  --   --  0.21* 0.35  CREATININE 2.09*  --   --   --     Estimated Creatinine Clearance: 34 mL/min (A) (by C-G formula based on SCr of 2.09 mg/dL (H)).   Medical History: Past Medical History:  Diagnosis Date   Acute lower UTI 09/20/2017   Bradycardia on ECG 07/31/2011   Coronary artery disease    hyperlipidemia   Decreased GFR 09/23/2019   Dehydration 09/21/2017   Diabetes mellitus    Diabetes mellitus without complication (HCC)    Hypercholesterolemia    Hypertension    Hypothyroidism    Left arm pain 01/29/2020   Paronychia of great toe of left foot 09/26/2018   Paronychia of great toe, left 09/26/2018   PERIPHERAL NEUROPATHY 01/13/2006   Qualifier: Diagnosis of  By: Jen Mow MD, Christine     Shortness of breath dyspnea    with exertion   Vasovagal syncope    Weakness 09/20/2017   Assessment: PACER WEHR is a 82 y.o. male presenting with TIA. PMH significant for CAD, HTN, HLD, CAD, dCHF, hypothyroidism, BPH, CKD-3A, peripheral neuropathy, former smoker. Patient now found to be in AF with CHADSVAsc at least 8. Patient was not on [facility] PTA per chart review. Pharmacy has been consulted to initiate and manage heparin infusion.   Baseline Labs: aPTT 35, PT 14.5, INR 1.1, Hgb 11.3, Hct  35.0, Plt 202   8/12 2056   HL 0.21 8/13 0528   HL 0.35, therapeutic X 1   Goal of Therapy:  Heparin level 0.3-0.5 units/ml Monitor platelets by anticoagulation protocol: Yes   Plan:  8/13:  HL @ 0528 = 0.35, therapeutic X 1  No boluses and tighter therapeutic range per heparin stroke protocol Continue pt on current rate and recheck HL in 8 hrs.  Continue to monitor H&H and platelets daily while on heparin infusion   , D 08/22/2022 6:53 AM

## 2022-08-22 NOTE — Progress Notes (Signed)
Inpatient Rehab Admissions Coordinator:    I spoke with Pt. Regarding potential CIR admit. Wife was also on the phone. They are interested and wife can provide 24/7 support at d/c. I will open a case with his insurance and pursue for admission.   Megan Salon, MS, CCC-SLP Rehab Admissions Coordinator  [phone] (celll) [phone] (office)

## 2022-08-22 NOTE — Progress Notes (Signed)
Occupational Therapy Treatment Patient Details Name: Dylan Robles MRN: [id number] DOB: [birth year] Today's Date: 08/22/2022   History of present illness 82 y/o male presented to ED on 08/20/22 for R sided weakness and difficulty speaking. CT negative. MRI showed small focus of acute ischemia at posterior left insula. PMH: CAD, HTN, HLD, dCHF, BPH, CKD-3A, peripheral neuropathy.   OT comments  Pt seen for OT tx this date. Pt eager to participate. Wife present and supportive throughout. Pt required supv for sup>sit EOB, MIN A for STS from EOB and initially unsteady but able to self correct with CGA. Pt ambulated in room with CGA-MIN A with noted impaired balance, particularly to the R side. CGA-MIN A for standing balance at sink, Pt utilizing LUE for intermittent UE support on counter. Pt able to open toothbrush packaging with difficulty and VC for strategy, opens toothpaste wiht difficulty, and applies toothpaste using R hand with difficulty. Pt brushed his teeth with decr coordination and increased time using R hand. Cues to locate deodorant on R side of countertop and he was able to apply with Min A for increased R shoulder abduction. Cross body reaching for paper towel, overhead wiping of sink mirror, and dynamic reaching to R side to toss items into the trash. Pt demonstrated excellent progress towards goals and continues to benefit from high intensity skilled OT services to maximize return to PLOF.       If plan is discharge home, recommend the following:  Assistance with cooking/housework;Direct supervision/assist for medications management;Direct supervision/assist for financial management;Assist for transportation;Help with stairs or ramp for entrance;Supervision due to cognitive status;A little help with walking and/or transfers;A little help with bathing/dressing/bathroom   Equipment Recommendations  Other (comment) (defer to next venue)    Recommendations for Other Services       Precautions / Restrictions Precautions Precautions: Fall Precaution Comments: mild R inattention Restrictions Weight Bearing Restrictions: No       Mobility Bed Mobility Overal bed mobility: Needs Assistance Bed Mobility: Supine to Sit     Supine to sit: Supervision, HOB elevated          Transfers Overall transfer level: Needs assistance Equipment used: None Transfers: Sit to/from Stand Sit to Stand: Min assist           General transfer comment: mildly unsteady but able to self correct     Balance Overall balance assessment: Needs assistance Sitting-balance support: No upper extremity supported, Feet supported Sitting balance-Leahy Scale: Good     Standing balance support: No upper extremity supported, During functional activity, Single extremity supported Standing balance-Leahy Scale: Poor Standing balance comment: intermittent UE support and intermittent CGA -MIN A during dynamic balance activities                           ADL either performed or assessed with clinical judgement   ADL Overall ADL's : Needs assistance/impaired     Grooming: Oral care;Applying deodorant;Standing;Contact guard assist;Minimal assistance;Cueing for compensatory techniques Grooming Details (indicate cue type and reason): CGA-MIN A for standing balance at sink, Pt utilizing LUE for intermittent UE support on counter. Pt able to open toothbrush packaging with difficulty and VC for strategy, opens toothpaste wiht difficulty, and applies toothpaste using R hand with difficulty. Pt brushed his teeth with decr coordination and increased time using R hand. Cues to locate deodorant on R side of countertop and he was able to apply with Min A for increased R shoulder abduction. [employer]  body reaching for paper towel, overhead wiping of sink mirror, and dynamic reaching to R side to toss items into the trash.                                    Extremity/Trunk  Assessment              Vision       Perception     Praxis      Cognition Arousal: Alert Behavior During Therapy: Flat affect Overall Cognitive Status: Impaired/Different from baseline Area of Impairment: Problem solving                             Problem Solving: Slow processing, Requires verbal cues General Comments: slow processing require frequent verbal cueing        Exercises      Shoulder Instructions       General Comments      Pertinent Vitals/ Pain       Pain Assessment Pain Assessment: No/denies pain  Home Living                                          Prior Functioning/Environment              Frequency  Min 1X/week        Progress Toward Goals  OT Goals(current goals can now be found in the care plan section)  Progress towards OT goals: Progressing toward goals  Acute Rehab OT Goals Patient Stated Goal: get better OT Goal Formulation: With family Time For Goal Achievement: 09/04/22 Potential to Achieve Goals: Good  Plan      Co-evaluation                 AM-PAC OT "6 Clicks" Daily Activity     Outcome Measure   Help from another person eating meals?: None Help from another person taking care of personal grooming?: A Little Help from another person toileting, which includes using toliet, bedpan, or urinal?: A Lot Help from another person bathing (including washing, rinsing, drying)?: A Lot Help from another person to put on and taking off regular upper body clothing?: A Little Help from another person to put on and taking off regular lower body clothing?: A Lot 6 Click Score: 16    End of Session Equipment Utilized During Treatment: Gait belt  OT Visit Diagnosis: Unsteadiness on feet (R26.81);Ataxia, unspecified (R27.0);Other abnormalities of gait and mobility (R26.89)   Activity Tolerance Patient tolerated treatment well   Patient Left in bed;with call bell/phone within  reach;with family/visitor present (seated EOB with spouse)   Nurse Communication Other (comment) (NT notified of BPs)        Time: 4098-1191 OT Time Calculation (min): 25 min  Charges: OT General Charges $OT Visit: 1 Visit OT Treatments $Self Care/Home Management : 23-37 mins  Arman Filter., MPH, MS, OTR/L ascom [phone] 08/22/22, 4:09 PM

## 2022-08-22 NOTE — Progress Notes (Signed)
Subjective:   Exam: Vitals:   08/22/22 0609 08/22/22 0727  BP: 125/85 137/81  Pulse: (!) 59 (!) 51  Resp:  18  Temp: 98.8 F (37.1 C) 98.3 F (36.8 C)  SpO2: 94% 96%   Gen: In bed, NAD Resp: non-labored breathing, no acute distress Abd: soft, nt  Neuro: MS: Awake, alert, continues to be aphasic, predominantly expressive, but much better than yesterday.   CN: Endorses seeing fingers wiggle in both hemifield's, face is symmetric Motor: He is able to raise all extremities against gravity, but mild 4+/5 weakness of the right leg Sensory: Endorses symmetric sensation   Pertinent Labs: LDL 77 A1c pending CTA head and neck-no relevant stenosis MRI brain-infarction in the left insula  Impression: 82 year old male with transient right-sided hemiplegia and aphasia which is markedly improved though not resolved with small area of ischemia on MRI.  My suspicion is that this represents a "mostly transient" ischemic attack with a much larger area initially at risk, with auto reperfusion resulting in improvement in symptoms.  He is already on Zetia and atorvastatin for cholesterol control, but not quite at goal.  I am not sure if I would consider this to be an atherogenic event as it was likely embolic based on symptomatology.  He has been found to be in atrial fibrillation, and this is likely etiology.  Recommendations: 1) with small size of infarct, I think it would be okay to start oral anticoagulation, but would be wary of triple therapy long-term, defer to cardiology for need.  Recommend Eliquis 5 mg twice daily. 2) continue Zetia and atorvastatin 3) PT, OT, ST 4) he has already been relatively normotensive, okay to start oral medications with plan to gradually control hypertension 5) neurology will be available on an as-needed basis, please call with further questions or concerns.  Ritta Slot, MD [facility] [phone]  If 7pm- 7am, please page neurology on  call as listed in AMION.

## 2022-08-22 NOTE — Progress Notes (Signed)
Physical Therapy Treatment Patient Details Name: Dylan Robles MRN: [id number] DOB: 07-25-1940 Today's Date: 08/22/2022   History of Present Illness 82 y/o male presented to ED on 08/20/22 for R sided weakness and difficulty speaking. CT negative. MRI showed small focus of acute ischemia at posterior left insula. PMH: CAD, HTN, HLD, dCHF, BPH, CKD-3A, peripheral neuropathy.    PT Comments  Patient progressing towards physical therapy goals. Continues to demonstrate mild R LE weakness and dynamic balance deficits. Ambulated 100' with no AD and minA. Reaching out for objects to steady self and pushing arm against therapist for support. Very slow gait speed and hesitant due to balance deficits. Participated in standing reaching task at mirror with use of R UE with minA for balance with crossbody reach. Required modA for balance during step taps on 3" surface with no UE support. Patient is eager to return to independence but is not at his baseline yet. Discharge plan remains appropriate.     If plan is discharge home, recommend the following: A little help with walking and/or transfers;A little help with bathing/dressing/bathroom;Assistance with cooking/housework;Direct supervision/assist for medications management;Direct supervision/assist for financial management;Assist for transportation;Help with stairs or ramp for entrance;Supervision due to cognitive status   Can travel by private vehicle        Equipment Recommendations  Rolling  (2 wheels)    Recommendations for Other Services Rehab consult     Precautions / Restrictions Precautions Precautions: Fall Precaution Comments: mild R inattention Restrictions Weight Bearing Restrictions: No     Mobility  Bed Mobility Overal bed mobility: Needs Assistance Bed Mobility: Supine to Sit, Sit to Supine     Supine to sit: Supervision, HOB elevated Sit to supine: Supervision, HOB elevated        Transfers Overall transfer level:  Needs assistance Equipment used: Rolling  (2 wheels) Transfers: Sit to/from Stand Sit to Stand: Min assist           General transfer comment: assist to steady upon standing from low bed surface x 3 during session    Ambulation/Gait Ambulation/Gait assistance: Min assist Gait Distance (Feet): 100 Feet Assistive device: None Gait Pattern/deviations: Step-through pattern, Decreased stride length, Drifts right/left, Trunk flexed Gait velocity: decreased     General Gait Details: patient reaching out for therapist assist during ambulation. MinA for balance as patient unsteady and drifting R during ambulation. Reaching out for objects while turning around   Stairs             Wheelchair Mobility     Tilt Bed    Modified Rankin (Stroke Patients Only) Modified Rankin (Stroke Patients Only) Pre-Morbid Rankin Score: No symptoms Modified Rankin: Moderately severe disability     Balance Overall balance assessment: Needs assistance Sitting-balance support: No upper extremity supported, Feet supported Sitting balance-Leahy Scale: Good     Standing balance support: No upper extremity supported, During functional activity Standing balance-Leahy Scale: Poor Standing balance comment: requires modA to maintain balance during taps on 3" surface with no UE                            Cognition Arousal: Alert Behavior During Therapy: Flat affect Overall Cognitive Status: Impaired/Different from baseline Area of Impairment: Awareness, Problem solving, Safety/judgement, Following commands, Attention                   Current Attention Level: Sustained   Following Commands: Follows one step commands with increased time  Safety/Judgement: Decreased awareness of deficits, Decreased awareness of safety Awareness: Emergent Problem Solving: Slow processing, Requires verbal cues General Comments: slow processing require frequent verbal cueing         Exercises Other Exercises Other Exercises: standing reaching task at mirror with no UE support. MinA for balance with reaching towards L with R hand Other Exercises: step taps on 3" surface with no UE support. ModA to maintain balance in standing with single leg stance on each side    General Comments        Pertinent Vitals/Pain Pain Assessment Pain Assessment: No/denies pain    Home Living                          Prior Function            PT Goals (current goals can now be found in the care plan section) Acute Rehab PT Goals Patient Stated Goal: to get back to independent PT Goal Formulation: With patient/family Time For Goal Achievement: 09/04/22 Potential to Achieve Goals: Good Progress towards PT goals: Progressing toward goals    Frequency    Min 1X/week      PT Plan      Co-evaluation              AM-PAC PT "6 Clicks" Mobility   Outcome Measure  Help needed turning from your back to your side while in a flat bed without using bedrails?: A Little Help needed moving from lying on your back to sitting on the side of a flat bed without using bedrails?: A Little Help needed moving to and from a bed to a chair (including a wheelchair)?: A Little Help needed standing up from a chair using your arms (e.g., wheelchair or bedside chair)?: A Lot Help needed to walk in hospital room?: A Little Help needed climbing 3-5 steps with a railing? : A Lot 6 Click Score: 16    End of Session Equipment Utilized During Treatment: Gait belt Activity Tolerance: Patient tolerated treatment well Patient left: in bed;with call bell/phone within reach;with bed alarm set;with family/visitor present Nurse Communication: Mobility status PT Visit Diagnosis: Unsteadiness on feet (R26.81);Muscle weakness (generalized) (M62.81);Other abnormalities of gait and mobility (R26.89);Difficulty in walking, not elsewhere classified (R26.2);Other symptoms and signs involving the  nervous system (R29.898)     Time: 1610-9604 PT Time Calculation (min) (ACUTE ONLY): 31 min  Charges:    $Therapeutic Exercise: 8-22 mins $Therapeutic Activity: 8-22 mins PT General Charges $$ ACUTE PT VISIT: 1 Visit                     Maylon Peppers, PT, DPT Physical Therapist - [facility]  [facility]     A  08/22/2022, 11:27 AM

## 2022-08-22 NOTE — Progress Notes (Signed)
  Chaplain On-Call responded to Spiritual Care Consult Order for prayer with the patient.  Chaplain met the patient and two family members at the bedside.  Chaplain provided spiritual and emotional support and prayer for healing after the patient's stroke.  Chaplain Morene Crocker., [facility]

## 2022-08-22 NOTE — Procedures (Signed)
History: 82 yo M with transient episode of aphasia  Sedation: None  Technique: This EEG was acquired with electrodes placed according to the International 10-20 electrode system (including Fp1, Fp2, F3, F4, C3, C4, P3, P4, O1, O2, T3, T4, T5, T6, A1, A2, Fz, Cz, Pz). The following electrodes were missing or displaced: none.   Background: The background consists of intermixed alpha and beta activities. There is a well defined posterior dominant rhythm of 11 Hz that attenuates with eye opening. Sleep is not recorded but there is anterior shifting of the PDR and increase in slow activity associated with drowsiness.   Photic stimulation: Physiologic driving is not performed  EEG Abnormalities: none  Clinical Interpretation: This normal EEG is recorded in the waking and drowsy state. There was no seizure or seizure predisposition recorded on this study. Please note that lack of epileptiform activity on EEG does not preclude the possibility of epilepsy.   Ritta Slot, MD [facility] [phone]  If 7pm- 7am, please page neurology on call as listed in AMION.

## 2022-08-22 NOTE — Progress Notes (Signed)
SUBJECTIVE: Feeling much better HPI   Vitals:   08/22/22 0054 08/22/22 0118 08/22/22 0609 08/22/22 0727  BP: (!) 154/69  125/85 137/81  Pulse: 73  (!) 59 (!) 51  Resp: 17   18  Temp: (!) 100.5 F (38.1 C) 100.1 F (37.8 C) 98.8 F (37.1 C) 98.3 F (36.8 C)  TempSrc: Oral Oral Oral   SpO2: 95%  94% 96%  Weight:      Height:        Intake/Output Summary (Last 24 hours) at 08/22/2022 1107 Last data filed at 08/22/2022 2841 Gross per 24 hour  Intake 178.78 ml  Output 1100 ml  Net -921.22 ml    LABS: Basic Metabolic Panel: Recent Labs    08/20/22 1955  NA 135  K 4.1  CL 103  CO2 22  GLUCOSE 256*  BUN 35*  CREATININE 2.09*  CALCIUM 8.7*   Liver Function Tests: Recent Labs    08/20/22 1955  AST 24  ALT 28  ALKPHOS 95  BILITOT 0.8  PROT 6.4*  ALBUMIN 3.3*   No results for input(s): "LIPASE", "AMYLASE" in the last 72 hours. CBC: Recent Labs    08/20/22 1955 08/21/22 1632 08/22/22 0528  WBC 6.8 8.1 8.2  NEUTROABS 4.6  --   --   HGB 10.3* 11.3* 10.6*  HCT 31.8* 35.0* 31.2*  MCV 91.9 91.6 88.1  PLT 188 202 190   Cardiac Enzymes: No results for input(s): "CKTOTAL", "CKMB", "CKMBINDEX", "TROPONINI" in the last 72 hours. BNP: Invalid input(s): "POCBNP" D-Dimer: No results for input(s): "DDIMER" in the last 72 hours. Hemoglobin A1C: Recent Labs    08/21/22 0502  HGBA1C 10.1*   Fasting Lipid Panel: Recent Labs    08/21/22 0502  CHOL 126  HDL 34*  LDLCALC 77  TRIG 74  CHOLHDL 3.7   Thyroid Function Tests: Recent Labs    08/20/22 1953  TSH 1.510   Anemia Panel: No results for input(s): "VITAMINB12", "FOLATE", "FERRITIN", "TIBC", "IRON", "RETICCTPCT" in the last 72 hours.   PHYSICAL EXAM General: Well developed, well nourished, in no acute distress HEENT:  Normocephalic and atramatic Neck:  No JVD.  Lungs: Clear bilaterally to auscultation and percussion. Heart: HRRR . Normal S1 and S2 without gallops or murmurs.  Abdomen: Bowel sounds  are positive, abdomen soft and non-tender  Msk:  Back normal, normal gait. Normal strength and tone for age. Extremities: No clubbing, cyanosis or edema.   Neuro: Alert and oriented X 3. Psych:  Good affect, responds appropriately  TELEMETRY: A-fib on exam but not on monitor  ASSESSMENT AND PLAN: New onset atrial fibrillation with controlled ventricular rate.  Speech is improving and strength in the right side is improving also.  Agree with stopping Brilinta aspirin and start Eliquis 5 mg p.o. twice daily.   ICD-10-CM   1. Atrial fibrillation, unspecified type (HCC)  I48.91     2. Difficulty with speech  R47.9       Principal Problem:   TIA (transient ischemic attack) Active Problems:   Hypothyroidism   Essential hypertension   Hyperlipidemia   BPH (benign prostatic hyperplasia)   Type II diabetes mellitus with renal manifestations (HCC)   Acute renal failure superimposed on stage 3a chronic kidney disease (HCC)   Chronic diastolic CHF (congestive heart failure) (HCC)   New onset atrial fibrillation (HCC)   Acute metabolic encephalopathy   CAD (coronary artery disease)    Adrian Blackwater, MD, [facility] 08/22/2022 11:07 AM

## 2022-08-22 NOTE — Inpatient Diabetes Management (Addendum)
Inpatient Diabetes Program Recommendations  AACE/ADA: New Consensus Statement on Inpatient Glycemic Control (2015)  Target Ranges:  Prepandial:   less than 140 mg/dL      Peak postprandial:   less than 180 mg/dL (1-2 hours)      Critically ill patients:  140 - 180 mg/dL    Latest Reference Range & Units 04/20/22 11:46 08/21/22 05:02  Hemoglobin A1C 4.8 - 5.6 % 10.1 ! 10.1 (H)  243 mg/dl  !: Data is abnormal (H): Data is abnormally high  Latest Reference Range & Units 08/21/22 08:06 08/21/22 12:17 08/21/22 17:58 08/21/22 20:36  Glucose-Capillary 70 - 99 mg/dL 161 (H) 096 (H) 045 (H) 148 (H)  (H): Data is abnormally high  Latest Reference Range & Units 08/22/22 07:28  Glucose-Capillary 70 - 99 mg/dL 409 (H)  (H): Data is abnormally high    Admit TIA/ New onset Atrial fibrillation  History: DM, CKD   Home DM Meds: Tresiba 16 units QPM       Novolog 10-16 units TID       FSL2 CGM   Current Orders: Semglee 12 units QAM   Novolog 0-9 units TID ac/hs    CBGs well controlled on current regimen  ENDO: Percell Boston, NP with [facility] Last seen 05/19/2022   Addendum 11:20am--Met w/ pt at bedside (wife also present).  Spoke with patient about his current A1c of 10.1% (which is unchanged since April 2024).  Explained what an A1c is and what it measures.  Reminded patient that his goal A1c is 7% or less per ADA standards to prevent both acute and long-term complications.  Explained to patient the extreme importance of good glucose control at home.  Encouraged patient to check his CBGs before and after meals since he has a [employer] 2 device at home.  Plans to restart sensor when he goes home.  Has all insulin at home and takes doses as prescribed.  Reviewed healthy goal CBGs for home.  Pt to follow up with ENDO after d/c.  Discussed with pt that he may need further titration of his insulin at home given his A1c shows higher CBGs at home.  Pt  appreciative of visit and did not have any questions for me regarding his diabetes.     --Will follow patient during hospitalization--  Ambrose Finland RN, MSN, CDCES Diabetes Coordinator Inpatient Glycemic Control Team Team Pager: [phone] (8a-5p)

## 2022-08-22 NOTE — Progress Notes (Signed)
   Patient is an 82 yr old male with hx of CAD, HTN, HLD, dCHF, BPH, CKD3A and peripheral neuropathy.   He presented to hospital 08/20/22 with R sided hemiparesis and Aphasia- His Sx's have improved somewhat, however he's still requiring a lot more assistance to complete ADLs and mobility with OT and OT He had ischemia on his MRI, which is consistent with a stroke in the L posterior insula.  Patient would benefit from CIR for acute stroke rehab.    IMPRESSION: 1. Small focus of acute ischemia at posterior left insula. No hemorrhage or mass effect. 2. Multiple old small vessel infarcts of the deep white matter.  New onset Afib, which is likely etiologoy of new stroke.   Of note, A1c is 10.2, and Cr elevated showing AKI in setting of CKD3A- baseline Cr 1.4-1.56- and Cr is 1.68 down from 2.09-   He meets medical necessity due to his new onset Afib, poorly controlled DM as well as AKI.

## 2022-08-22 NOTE — Plan of Care (Signed)

## 2022-08-23 DIAGNOSIS — I639 Cerebral infarction, unspecified: Secondary | ICD-10-CM

## 2022-08-23 MED ORDER — APIXABAN 2.5 MG PO TABS: 2.5000 mg | ORAL_TABLET | Freq: Two times a day (BID) | ORAL | 3 refills | Status: DC

## 2022-08-23 NOTE — TOC Transition Note (Signed)
Transition of Care [facility]) - CM/SW Discharge Note   Patient Details  Name: Dylan Robles MRN: [id number] Date of Birth: [birth year]  [facility] CM/SW Contact:  Allena Katz, LCSW Phone Number: 08/23/2022, 12:55 PM   Clinical Narrative:   CSW spoke with wife who reported they are discharging home and would like HH and a RW. RW ordered to be delivered through [facility]. No preference on HH. Message sent to corey with [facility]. [facility] is able to accept for PT/OT/Speech.           Patient Goals and CMS Choice      Discharge Placement                         Discharge Plan and Services Additional resources added to the After Visit Summary for                                       Social Determinants of Health (SDOH) Interventions SDOH Screenings   Food Insecurity: No Food Insecurity (08/20/2022)  Housing: Low Risk  (08/20/2022)  Transportation Needs: No Transportation Needs (08/20/2022)  Utilities: Not At Risk (08/20/2022)  Alcohol Screen: Low Risk  (03/29/2021)  Depression (PHQ2-9): Low Risk  (06/14/2022)  Financial Resource Strain: Low Risk  (03/29/2021)  Physical Activity: Insufficiently Active (03/29/2021)  Social Connections: Socially Integrated (03/29/2021)  Stress: No Stress Concern Present (03/29/2021)  Tobacco Use: Medium Risk (08/20/2022)     Readmission Risk Interventions     No data to display

## 2022-08-23 NOTE — Discharge Summary (Signed)
Dylan Robles ZOX:[id number] DOB: 09-Aug-1940 DOA: 08/20/2022  PCP: Billie Lade, MD  Admit date: 08/20/2022 Discharge date: 08/23/2022  Time spent: 35 minutes  Recommendations for Outpatient Follow-up:  Pcp, cardiology, and neurology f/u Better glycemic control     Discharge Diagnoses:  Principal Problem:   Acute CVA (cerebrovascular accident) [facility]) Active Problems:   TIA (transient ischemic attack)   New onset atrial fibrillation (HCC)   Chronic diastolic CHF (congestive heart failure) (HCC)   Acute metabolic encephalopathy   Hypothyroidism   Essential hypertension   Hyperlipidemia   Type II diabetes mellitus with renal manifestations (HCC)   Acute renal failure superimposed on stage 3a chronic kidney disease (HCC)   BPH (benign prostatic hyperplasia)   CAD (coronary artery disease)   Discharge Condition: stable  Diet recommendation: heart healthy carb modified  Filed Weights   08/20/22 1956 08/20/22 2217  Weight: 110.3 kg 107.6 kg    History of present illness:  From admission h and p Dylan Robles is a 82 y.o. male with medical history significant of CAD, HTN, HLD, CAD, dCHF, hypothyroidism, BPH, CKD-3A, peripheral neuropathy, former smoker, who presents with AMS, confusion, right-sided weakness, difficulty speaking.   Per his wife at bedside, around 6:00 PM, she heard that patient had coughing spell, then pt suddenly stopped coughing, and started staring in space with confusion. No seizure activity noted.  Patient has difficulty speaking, and right-sided weakness which has resolved at arrival to ED. When I saw patient in ED, he still has some difficulty speaking, no unilateral numbness or tingling in extremities. Patient has decreased sensation in right leg. No facial droop, vision loss or hearing loss.  Patient is mildly confused, not oriented to time, but is still orientated to the person and place.  Following commands. Patient has mild shortness of breath, no active  cough currently, denies chest pain.  No fever or chills.  Patient does not have nausea, vomiting, diarrhea or abdominal pain.  No symptoms of UTI.  No rectal bleeding or dark stool.    Hospital Course:  Patient presents with confusion and right-sided weakness and dysarthria. Found to have acute CVA posterior left insula and evidence of multiple prior strokes. A1c in the 10s. Also found to have new a-fib, probable etiology. Neurology and cardiology consulted. Started on apixaban. Initial plan for inpatient rehab but patient made rapid progress and now PT advises home health so will discharge with rolling walker and home health pt/ot/slp. Better glycemic control advised. Outpatient neurology referral made, will also need f/u with PCP and with cardiology given new dx of a-fib.   Procedures: none   Consultations: Cardiology, neurology  Discharge Exam: Vitals:   08/23/22 0755 08/23/22 0823  BP: 136/70   Pulse:  (!) 56  Resp: 16   Temp: 99 F (37.2 C)   SpO2: 100%     General: NAD Cardiovascular: irreg irreg Respiratory: CTAB  Discharge Instructions   Discharge Instructions     Ambulatory referral to Neurology   Complete by: As directed    Diet - low sodium heart healthy   Complete by: As directed    Increase activity slowly   Complete by: As directed       Allergies as of 08/23/2022       Reactions   Plavix [clopidogrel] Hives        Medication List     STOP taking these medications    aspirin EC 81 MG tablet   Brilinta 60 MG Tabs tablet  Generic drug: ticagrelor       TAKE these medications    apixaban 2.5 MG Tabs tablet Commonly known as: ELIQUIS Take 1 tablet (2.5 mg total) by mouth 2 (two) times daily.   atorvastatin 80 MG tablet Commonly known as: LIPITOR TAKE 1 TABLET AT BEDTIME.   cetirizine 5 MG tablet Commonly known as: ZYRTEC Take 1 tablet (5 mg total) by mouth daily.   ezetimibe 10 MG tablet Commonly known as: ZETIA TAKE 1 TABLET EVERY  DAY   Fish Oil-Vitamin D 1000-1000 MG-UNIT Caps Take by mouth once. daily   FreeStyle Libre 2 Sensor Misc Use to check glucose at least 4 times daily   furosemide 20 MG tablet Commonly known as: LASIX Take 20 mg by mouth as needed.   gabapentin 300 MG capsule Commonly known as: NEURONTIN Take 1 capsule (300 mg total) by mouth 3 (three) times daily.   Gvoke HypoPen 2-Pack 1 MG/0.2ML Soaj Generic drug: Glucagon Inject 1 mg into the skin daily as needed.   isosorbide mononitrate 60 MG 24 hr tablet Commonly known as: IMDUR Take 1 tablet by mouth once daily   levothyroxine 88 MCG tablet Commonly known as: SYNTHROID TAKE 1 TABLET EVERY DAY BEFORE BREAKFAST   metoprolol succinate 50 MG 24 hr tablet Commonly known as: TOPROL-XL TAKE 1 TABLET EVERY DAY   NovoLOG FlexPen 100 UNIT/ML FlexPen Generic drug: insulin aspart Inject 10-16 Units into the skin 3 (three) times daily with meals.   spironolactone 25 MG tablet Commonly known as: Aldactone Take 1 tablet (25 mg total) by mouth daily.   tamsulosin 0.4 MG Caps capsule Commonly known as: FLOMAX Take 1 capsule (0.4 mg total) by mouth at bedtime.   Evaristo Bury FlexTouch 100 UNIT/ML FlexTouch Pen Generic drug: insulin degludec Inject 16 Units into the skin at bedtime.   triamcinolone cream 0.1 % Commonly known as: KENALOG Apply 1 Application topically 2 (two) times daily.               Durable Medical Equipment  (From admission, onward)           Start     Ordered   08/23/22 1130  DME Walker  Once       Question Answer Comment  Walker: With 5 Inch Wheels   Patient needs a walker to treat with the following condition CVA (cerebral vascular accident) (HCC)      08/23/22 1132           Allergies  Allergen Reactions   Plavix [Clopidogrel] Hives    Follow-up Information     Billie Lade, MD Follow up.   Specialty: Internal Medicine Contact information: [address] [place]  [address] [phone]         Laurier Nancy, MD Follow up.   Specialty: Cardiology Contact information: [address] [place] [address] [phone]                  The results of significant diagnostics from this hospitalization (including imaging, microbiology, ancillary and laboratory) are listed below for reference.    Significant Diagnostic Studies: EEG adult  Result Date: 09/04/22 Rejeana Brock, MD     08/22/2022  8:23 AM History: 82 yo M with transient episode of aphasia Sedation: None Technique: This EEG was acquired with electrodes placed according to the International 10-20 electrode system (including Fp1, Fp2, F3, F4, C3, C4, P3, P4, O1, O2, T3, T4, T5, T6, A1, A2, Fz, Cz, Pz).  The following electrodes were missing or displaced: none. Background: The background consists of intermixed alpha and beta activities. There is a well defined posterior dominant rhythm of 11 Hz that attenuates with eye opening. Sleep is not recorded but there is anterior shifting of the PDR and increase in slow activity associated with drowsiness. Photic stimulation: Physiologic driving is not performed EEG Abnormalities: none Clinical Interpretation: This normal EEG is recorded in the waking and drowsy state. There was no seizure or seizure predisposition recorded on this study. Please note that lack of epileptiform activity on EEG does not preclude the possibility of epilepsy. Ritta Slot, MD [facility] [phone] If 7pm- 7am, please page neurology on call as listed in AMION.  ECHOCARDIOGRAM COMPLETE  Result Date: 08/21/2022    ECHOCARDIOGRAM REPORT   Patient Name:   Dylan Robles Date of Exam: 08/21/2022 Medical Rec #:  [id number]     Height:       78.0 in Accession #:    [id number]    Weight:       237.2 lb Date of Birth:  01/08/1941      BSA:          2.427 m Patient Age:    82 years      BP:           133/73 mmHg Patient Gender: M             HR:            57 bpm. Exam Location:  [facility] Procedure: 2D Echo, Cardiac Doppler, Color Doppler and Saline Contrast Bubble            Study Indications:     TIA G45.9  History:         Patient has prior history of Echocardiogram examinations, most                  recent 03/25/2020. CAD; Risk Factors:Diabetes and Hypertension.  Sonographer:     Cristela Blue Referring Phys:  Wynona Neat NIU Diagnosing Phys: Adrian Blackwater IMPRESSIONS  1. Left ventricular ejection fraction, by estimation, is 55 to 60%. The left ventricle has normal function. The left ventricle has no regional wall motion abnormalities. Left ventricular diastolic parameters were normal.  2. Right ventricular systolic function is normal. The right ventricular size is normal.  3. Left atrial size was mildly dilated.  4. Right atrial size was mildly dilated.  5. The mitral valve is normal in structure. Mild mitral valve regurgitation. No evidence of mitral stenosis.  6. The aortic valve is normal in structure. Aortic valve regurgitation is not visualized. Aortic valve sclerosis/calcification is present, without any evidence of aortic stenosis.  7. The inferior vena cava is normal in size with greater than 50% respiratory variability, suggesting right atrial pressure of 3 mmHg. FINDINGS  Left Ventricle: Left ventricular ejection fraction, by estimation, is 55 to 60%. The left ventricle has normal function. The left ventricle has no regional wall motion abnormalities. The left ventricular internal cavity size was normal in size. There is  no left ventricular hypertrophy. Left ventricular diastolic parameters were normal. Right Ventricle: The right ventricular size is normal. No increase in right ventricular wall thickness. Right ventricular systolic function is normal. Left Atrium: Left atrial size was mildly dilated. Right Atrium: Right atrial size was mildly dilated. Pericardium: There is no evidence of pericardial effusion. Mitral Valve: The mitral valve is normal in  structure. Mild mitral valve regurgitation. No evidence of mitral valve  stenosis. Tricuspid Valve: The tricuspid valve is normal in structure. Tricuspid valve regurgitation is mild . No evidence of tricuspid stenosis. Aortic Valve: The aortic valve is normal in structure. Aortic valve regurgitation is not visualized. Aortic valve sclerosis/calcification is present, without any evidence of aortic stenosis. Aortic valve mean gradient measures 3.0 mmHg. Aortic valve peak  gradient measures 5.0 mmHg. Aortic valve area, by VTI measures 2.24 cm. Pulmonic Valve: The pulmonic valve was normal in structure. Pulmonic valve regurgitation is not visualized. No evidence of pulmonic stenosis. Aorta: The aortic root is normal in size and structure. Venous: The inferior vena cava is normal in size with greater than 50% respiratory variability, suggesting right atrial pressure of 3 mmHg. IAS/Shunts: No atrial level shunt detected by color flow Doppler. Agitated saline contrast was given intravenously to evaluate for intracardiac shunting.  LEFT VENTRICLE PLAX 2D LVIDd:         4.60 cm   Diastology LVIDs:         3.20 cm   LV e' medial:    8.27 cm/s LV PW:         1.20 cm   LV E/e' medial:  11.7 LV IVS:        0.90 cm   LV e' lateral:   13.50 cm/s LVOT diam:     2.10 cm   LV E/e' lateral: 7.2 LV SV:         41 LV SV Index:   17 LVOT Area:     3.46 cm  RIGHT VENTRICLE RV Basal diam:  4.00 cm RV Mid diam:    3.30 cm RV S prime:     11.60 cm/s TAPSE (M-mode): 2.0 cm LEFT ATRIUM           Index        RIGHT ATRIUM           Index LA diam:      5.00 cm 2.06 cm/m   RA Area:     17.40 cm LA Vol (A2C): 99.8 ml 41.12 ml/m  RA Volume:   49.30 ml  20.31 ml/m LA Vol (A4C): 40.0 ml 16.48 ml/m  AORTIC VALVE AV Area (Vmax):    2.10 cm AV Area (Vmean):   2.13 cm AV Area (VTI):     2.24 cm AV Vmax:           112.00 cm/s AV Vmean:          71.750 cm/s AV VTI:            0.184 m AV Peak Grad:      5.0 mmHg AV Mean Grad:      3.0 mmHg LVOT  Vmax:         67.90 cm/s LVOT Vmean:        44.100 cm/s LVOT VTI:          0.119 m LVOT/AV VTI ratio: 0.65  AORTA Ao Root diam: 3.90 cm MITRAL VALVE               TRICUSPID VALVE MV Area (PHT): 5.16 cm    TR Peak grad:   11.6 mmHg MV Decel Time: 147 msec    TR Vmax:        170.00 cm/s MV E velocity: 96.90 cm/s MV A velocity: 55.50 cm/s  SHUNTS MV E/A ratio:  1.75        Systemic VTI:  0.12 m  Systemic Diam: 2.10 cm Adrian Blackwater Electronically signed by Adrian Blackwater Signature Date/Time: 08/21/2022/11:21:14 AM    Final    MR BRAIN WO CONTRAST  Result Date: 08/20/2022 CLINICAL DATA:  Transient ischemic attack EXAM: MRI HEAD WITHOUT CONTRAST TECHNIQUE: Multiplanar, multiecho pulse sequences of the brain and surrounding structures were obtained without intravenous contrast. COMPARISON:  None Available. FINDINGS: Brain: Small focus of acute ischemia at posterior left insula. No acute or chronic hemorrhage. There is multifocal hyperintense T2-weighted signal within the white matter. Generalized volume loss. Multiple old small vessel infarcts of the deep white matter. The midline structures are normal. Vascular: Major flow voids are preserved. Skull and upper cervical spine: Normal calvarium and skull base. Visualized upper cervical spine and soft tissues are normal. Sinuses/Orbits:No paranasal sinus fluid levels or advanced mucosal thickening. No mastoid or middle ear effusion. Normal orbits. IMPRESSION: 1. Small focus of acute ischemia at posterior left insula. No hemorrhage or mass effect. 2. Multiple old small vessel infarcts of the deep white matter. Electronically Signed   By: Deatra Robinson M.D.   On: 08/20/2022 22:21   CT ANGIO HEAD NECK W WO CM (CODE STROKE)  Result Date: 08/20/2022 CLINICAL DATA:  Stroke follow-up EXAM: CT ANGIOGRAPHY HEAD AND NECK WITH AND WITHOUT CONTRAST TECHNIQUE: Multidetector CT imaging of the head and neck was performed using the standard protocol during  bolus administration of intravenous contrast. Multiplanar CT image reconstructions and MIPs were obtained to evaluate the vascular anatomy. Carotid stenosis measurements (when applicable) are obtained utilizing NASCET criteria, using the distal internal carotid diameter as the denominator. RADIATION DOSE REDUCTION: This exam was performed according to the departmental dose-optimization program which includes automated exposure control, adjustment of the mA and/or kV according to patient size and/or use of iterative reconstruction technique. CONTRAST:  75mL OMNIPAQUE IOHEXOL 350 MG/ML SOLN COMPARISON:  08/20/2022 FINDINGS: CTA NECK FINDINGS SKELETON: There is no bony spinal canal stenosis. No lytic or blastic lesion. OTHER NECK: Normal pharynx, larynx and major salivary glands. No cervical lymphadenopathy. Unremarkable thyroid gland. UPPER CHEST: No pneumothorax or pleural effusion. No nodules or masses. AORTIC ARCH: There is calcific atherosclerosis of the aortic arch. Conventional 3 vessel aortic branching pattern. RIGHT CAROTID SYSTEM: No dissection, occlusion or aneurysm. There is mixed density atherosclerosis extending into the proximal ICA, resulting in 50% stenosis. LEFT CAROTID SYSTEM: No dissection, occlusion or aneurysm. Mild atherosclerotic calcification at the carotid bifurcation without hemodynamically significant stenosis. VERTEBRAL ARTERIES: Right dominant configuration.There is no dissection, occlusion or flow-limiting stenosis to the skull base (V1-V3 segments). CTA HEAD FINDINGS POSTERIOR CIRCULATION: --Vertebral arteries: Severe stenosis of the left V4 segment over length of approximately 5 mm (series 8 image 151. Mild atherosclerosis of the right V4 segment without stenosis. --Inferior cerebellar arteries: Normal. --Basilar artery: Diminutive basilar artery --Superior cerebellar arteries: Normal. --Posterior cerebral arteries (PCA): Normal. ANTERIOR CIRCULATION: --Intracranial internal carotid  arteries: Normal. --Anterior cerebral arteries (ACA): Normal. Both A1 segments are present. Patent anterior communicating artery (a-comm). --Middle cerebral arteries (MCA): Normal. VENOUS SINUSES: As permitted by contrast timing, patent. ANATOMIC VARIANTS: None Review of the MIP images confirms the above findings. IMPRESSION: 1. No emergent large vessel occlusion. 2. Severe stenosis of the left vertebral artery V4 segment over a length of approximately 5 mm. 3. A 50% stenosis of the proximal right internal carotid artery secondary to mixed density atherosclerosis. Aortic Atherosclerosis (ICD10-I70.0). Electronically Signed   By: Deatra Robinson M.D.   On: 08/20/2022 20:03   CT HEAD CODE STROKE WO CONTRAST  Result Date: 08/20/2022 CLINICAL DATA:  Code stroke.  Slurred speech EXAM: CT HEAD WITHOUT CONTRAST TECHNIQUE: Contiguous axial images were obtained from the base of the skull through the vertex without intravenous contrast. RADIATION DOSE REDUCTION: This exam was performed according to the departmental dose-optimization program which includes automated exposure control, adjustment of the mA and/or kV according to patient size and/or use of iterative reconstruction technique. COMPARISON:  None Available. FINDINGS: Brain: There is no mass, hemorrhage or extra-axial collection. There is generalized atrophy without lobar predilection. There is hypoattenuation of the periventricular white matter, most commonly indicating chronic ischemic microangiopathy. Old right basal ganglia small vessel infarct. Vascular: Atherosclerotic calcification of the vertebral and internal carotid arteries at the skull base. No abnormal hyperdensity of the major intracranial arteries or dural venous sinuses. Skull: The visualized skull base, calvarium and extracranial soft tissues are normal. Sinuses/Orbits: No fluid levels or advanced mucosal thickening of the visualized paranasal sinuses. No mastoid or middle ear effusion. The orbits  are normal. ASPECTS [facility] Stroke Program Early CT Score) - Ganglionic level infarction (caudate, lentiform nuclei, internal capsule, insula, M1-M3 cortex): 7 - Supraganglionic infarction (M4-M6 cortex): 3 Total score (0-10 with 10 being normal): 10 IMPRESSION: 1. No acute intracranial abnormality. 2. ASPECTS is 10. These results were called by telephone at the time of interpretation on 08/20/2022 at 7:44 pm to provider [facility] , who verbally acknowledged these results. Electronically Signed   By: Deatra Robinson M.D.   On: 08/20/2022 19:44    Microbiology: No results found for this or any previous visit (from the past 240 hour(s)).   Labs: Basic Metabolic Panel: Recent Labs  Lab 08/20/22 1955 08/22/22 0528  NA 135  --   K 4.1  --   CL 103  --   CO2 22  --   GLUCOSE 256*  --   BUN 35*  --   CREATININE 2.09* 1.68*  CALCIUM 8.7*  --    Liver Function Tests: Recent Labs  Lab 08/20/22 1955  AST 24  ALT 28  ALKPHOS 95  BILITOT 0.8  PROT 6.4*  ALBUMIN 3.3*   No results for input(s): "LIPASE", "AMYLASE" in the last 168 hours. No results for input(s): "AMMONIA" in the last 168 hours. CBC: Recent Labs  Lab 08/20/22 1955 08/21/22 1632 08/22/22 0528  WBC 6.8 8.1 8.2  NEUTROABS 4.6  --   --   HGB 10.3* 11.3* 10.6*  HCT 31.8* 35.0* 31.2*  MCV 91.9 91.6 88.1  PLT 188 202 190   Cardiac Enzymes: No results for input(s): "CKTOTAL", "CKMB", "CKMBINDEX", "TROPONINI" in the last 168 hours. BNP: BNP (last 3 results) Recent Labs    08/20/22 1953  BNP 439.8*    ProBNP (last 3 results) No results for input(s): "PROBNP" in the last 8760 hours.  CBG: Recent Labs  Lab 08/21/22 2036 08/22/22 0728 08/22/22 1203 08/22/22 1605 08/22/22 2120  GLUCAP 148* 105* 177* 201* 132*       Signed:  Silvano Bilis MD.  [facility] 08/23/2022, 11:36 AM

## 2022-08-23 NOTE — Progress Notes (Signed)
Speech Language Pathology Treatment: Cognitive-Linquistic  Patient Details Name: Dylan Robles MRN: [id number] DOB: [birth year] Today's Date: 08/23/2022 Time: 4098-1191 SLP Time Calculation (min) (ACUTE ONLY): 55 min  Assessment / Plan / Recommendation Clinical Impression  Pt seen for ongoing f/u for speech/language tx. Pt was alert, pleasant, and cooperative; often smiling. Wife present during session. Pt eating some of his lunch meal b/f D/Cing home today. Pt endorses he continues to have hesitations/challenges "getting my words out sometimes".  On RA, afebrile.   Pt presents w/ speech/language impairment most c/w anomic Aphasia. Pt's speech is c/b anomia and hesitations of speech w/ inconsistent repair of breakdowns; both phonemic and semantic paraphasias noted. Practiced w/ responsive naming tasks for nouns and convergent naming; pt stimulable for verbal hierachical cues (phonemic cueing successful). Intermittent phonemic errors noted; less consistent semantic errors today vs at evaluation. Pt completed verbal tasks of object description/function w/ verbal cues given when needed for repair of errors/accuracy. Pt stated he was aware of, "hearing", when it "doesn't come out right". Encouraged him to slow down and get a little louder when talking and to keep talking -- talk about things around the home, talk to their goats to engage verbal language. Pt appears to exhibit functional receptive language skills c/b following instructions and completing tasks w/ this SLP today. Speech intelligibility was functional; min low volume.    Recommend ongoing therapy targeting functional communication in current setting and ST services post Discharge for ongoing formal assessment of strengths/deficits. Recommend frequent/constant Supervision at D/C(24/7) for safety. SLP to f/u per POC for continued speech/language tx targeting functional communication while admitted; ongoing education w/ Family.   Pt and Wife made  aware of results of today's session, changes to functional communication following stroke, basic communication strategies, and SLP POC including f/u skilled ST therapy at D/C. Pt/Wife verbalized understanding; Handouts given. MD/NSG/TOC made aware of results of assessment, recommendations, communication strategies, and SLP POC.      HPI HPI: Pt is a 82 year old man past history of coronary artery disease, hyperlipidemia, diabetes, hypertension, prior syncope episodes, presented to the emergency room for evaluation of sudden onset of not following commands, staring and being unresponsive followed by right-sided weakness.  Per ED note, patient "has difficulty speaking, and right-sided weakness which has resolved at arrival to ED. When I saw patient in ED, he still has some difficulty speaking, no unilateral numbness or tingling in extremities. Patient has decreased sensation in right leg.".  MRI: Small focus of acute ischemia at posterior left insula. No  hemorrhage or mass effect.  2. Multiple old small vessel infarcts of the deep white matter.      SLP Plan  Continue with current plan of care      Recommendations for follow up therapy are one component of a multi-disciplinary discharge planning process, led by the attending physician.  Recommendations may be updated based on patient status, additional functional criteria and insurance authorization.    Recommendations  Diet recommendations: Regular;Thin liquid Liquids provided via: Cup;Straw Medication Administration: Whole meds with liquid (or in puree) Supervision: Patient able to self feed Compensations: Minimize environmental distractions;Slow rate;Small sips/bites Postural Changes and/or Swallow Maneuvers: Out of bed for meals;Seated upright 90 degrees                Rehab consult (HHST vs Outpt ST at D/C) Oral care BID;Oral care before and after PO;Patient independent with oral care   Intermittent Supervision/Assistance Aphasia  (R47.01)     Continue with current plan  of care       Jerilynn Som, MS, CCC-SLP Speech Language Pathologist Rehab Services; [facility] [phone] (ascom) ,  08/23/2022, 12:24 PM

## 2022-08-23 NOTE — Progress Notes (Signed)
Occupational Therapy Treatment Patient Details Name: Dylan Robles MRN: [id number] DOB: 30-Mar-1940 Today's Date: 08/23/2022   History of present illness 82 y/o male presented to ED on 08/20/22 for R sided weakness and difficulty speaking. CT negative. MRI showed small focus of acute ischemia at posterior left insula. PMH: CAD, HTN, HLD, dCHF, BPH, CKD-3A, peripheral neuropathy.   OT comments  Chart reviewed to date, pt greeted in room with wife present. Increased time required for processing throughout. Tx session targeted improving ADL completion via task oriented training. Improvements noted in functional mobility with pt amb to bathroom with RW with CGA, perform toilet transfer with CGA, grooming tasks standing at the sink with CGA-close supervision. Minimal-no R lean noted throughout. Education/discussion with pt and wife re: DME needed for safe ADL completion at home as they plan to now dc home. All questions answered within scope. Pt is left as received, all needs met. OT will follow acutely.       If plan is discharge home, recommend the following:  Assistance with cooking/housework;Direct supervision/assist for medications management;Direct supervision/assist for financial management;Assist for transportation;Help with stairs or ramp for entrance;Supervision due to cognitive status;A little help with walking and/or transfers;A little help with bathing/dressing/bathroom   Equipment Recommendations  Other (comment) (2WW; pt has bsc and wife has just purchased a shower chair)    Recommendations for Other Services      Precautions / Restrictions Precautions Precautions: Fall Restrictions Weight Bearing Restrictions: No       Mobility Bed Mobility               General bed mobility comments: NT in recliner pre/post sessio n    Transfers Overall transfer level: Needs assistance Equipment used: Rolling walker (2 wheels) Transfers: Sit to/from Stand Sit to Stand: Contact  guard assist                 Balance Overall balance assessment: Needs assistance Sitting-balance support: No upper extremity supported, Feet supported Sitting balance-Leahy Scale: Good     Standing balance support: No upper extremity supported, During functional activity, Single extremity supported Standing balance-Leahy Scale: Fair                             ADL either performed or assessed with clinical judgement   ADL Overall ADL's : Needs assistance/impaired     Grooming: Oral care;Wash/dry face;Standing;Supervision/safety, dynamic tasks with CGA Grooming Details (indicate cue type and reason): improved R sided attention on this date with pt completing brushing teeth and washing face at sink levelwith CGA, able to open all package/containers with supervision, intermittent vcs throughout             Lower Body Dressing: Minimal assistance;Sitting/lateral leans   Toilet Transfer: Contact guard assist;BSC/3in1;Rolling walker (2 wheels);Ambulation Toilet Transfer Details (indicate cue type and reason): intermittent vcs for technique Toileting- Clothing Manipulation and Hygiene: Contact guard assist;Sitting/lateral lean       Functional mobility during ADLs: Contact guard assist;Rolling walker (2 wheels) (household distances in room)        Cognition Arousal: Alert Behavior During Therapy: Flat affect Overall Cognitive Status: Impaired/Different from baseline Area of Impairment: Problem solving                             Problem Solving: Slow processing, Requires verbal cues          Exercises Other Exercises  Other Exercises: edu pt and wife re: safe ADL completion with/without DME, discharge recommendations    Shoulder Instructions       General Comments Pt was pleasant and willing to particiate with PT.  Did not need excessive cuing but struggled to vocalize/interact much.  Pt did have slower more guarded ambulation w/o AD  (100 ft) w/o LOBs or overt usteadiness, but PT still recommending AD use at d/c.    Pertinent Vitals/ Pain       Pain Assessment Pain Assessment: No/denies pain         Frequency  Min 1X/week        Progress Toward Goals  OT Goals(current goals can now be found in the care plan section)  Progress towards OT goals: Progressing toward goals      AM-PAC OT "6 Clicks" Daily Activity     Outcome Measure   Help from another person eating meals?: None Help from another person taking care of personal grooming?: None Help from another person toileting, which includes using toliet, bedpan, or urinal?: A Little Help from another person bathing (including washing, rinsing, drying)?: A Little Help from another person to put on and taking off regular upper body clothing?: None Help from another person to put on and taking off regular lower body clothing?: A Little 6 Click Score: 21    End of Session Equipment Utilized During Treatment: Gait belt;Rolling walker (2 wheels)  OT Visit Diagnosis: Unsteadiness on feet (R26.81);Ataxia, unspecified (R27.0);Other abnormalities of gait and mobility (R26.89)   Activity Tolerance Patient tolerated treatment well   Patient Left in chair;with call bell/phone within reach   Nurse Communication  (DME needs)        Time: 1884-1660 OT Time Calculation (min): 19 min  Charges: OT General Charges $OT Visit: 1 Visit OT Treatments $Self Care/Home Management : 8-22 mins  Oleta Mouse, OTD OTR/L  08/23/22, 12:07 PM

## 2022-08-23 NOTE — Progress Notes (Signed)
Physical Therapy Treatment Patient Details Name: Dylan Robles MRN: [id number] DOB: Feb 14, 1940 Today's Date: 08/23/2022   History of Present Illness 82 y/o male presented to ED on 08/20/22 for R sided weakness and difficulty speaking. CT negative. MRI showed small focus of acute ischemia at posterior left insula. PMH: CAD, HTN, HLD, dCHF, BPH, CKD-3A, peripheral neuropathy.    PT Comments  Pt pleasant and motivated t/o session, continues to have significant difficulty with sentence formation/word finding but continues to show increased mobility, activity tolerance and strength.  Pt was able to ambulate ~100 ft w/ RW and ~100 ft w/o UE/AD.  Pt with slower and more guarded effort w/o UE use but had no overt LOBs or unsteadiness.  He did, however, display balance deficits and need for UEs during moderate balance challenges and this PT continues to recommend AD use at discharge.  Pt's wife present t/o session and endorses being able to provide 24/7 supervision should he return home, she and pt eager to do so.  Pt will benefit from continued PT to address functional limitations and insure safe and appropriate d/c.      If plan is discharge home, recommend the following: A little help with walking and/or transfers;A little help with bathing/dressing/bathroom;Assistance with cooking/housework;Direct supervision/assist for medications management;Direct supervision/assist for financial management;Assist for transportation;Help with stairs or ramp for entrance;Supervision due to cognitive status   Can travel by private vehicle        Equipment Recommendations  Rolling walker (2 wheels) (reports walker is not suitable condition for use)    Recommendations for Other Services       Precautions / Restrictions Precautions Precautions: Fall Restrictions Weight Bearing Restrictions: No     Mobility  Bed Mobility Overal bed mobility: Modified Independent Bed Mobility: Supine to Sit           General  bed mobility comments: Pt sitting EOB on arrival, looking confident and stable even with angled bed surface (legs elevated)    Transfers Overall transfer level: Needs assistance Equipment used: Rolling walker (2 wheels) Transfers: Sit to/from Stand Sit to Stand: Supervision           General transfer comment: tall pt able to rise w/o assist from standard height bed, expected reliance on UEs, similarly able to rise multiple times from recliner w/o phyiscal assist using UEs    Ambulation/Gait Ambulation/Gait assistance: Supervision Gait Distance (Feet): 200 Feet Assistive device: Rolling walker (2 wheels), None         General Gait Details: ~100 ft with walker and ~100 ft w/o AD/UEs.  Pt with guarded but safe cadence and able to manage low grade challenges, stop/go, minimal head turns, obstacle avoidance.  PT till encouraging some AD use (SPC or walker at home).  No LOBs, HR remains in the 80s and O2 in the high 90s t/o the effort.   Stairs             Wheelchair Mobility     Tilt Bed    Modified Rankin (Stroke Patients Only)       Balance Overall balance assessment: Needs assistance Sitting-balance support: No upper extremity supported, Feet supported Sitting balance-Leahy Scale: Good     Standing balance support: No upper extremity supported, During functional activity, Single extremity supported Standing balance-Leahy Scale: Fair Standing balance comment: Pt was able to perform some low grade balance challenges w/o UEs, but clearly required UE support for eyes closed, marching, heel raise activities.  Cognition Arousal: Alert Behavior During Therapy: Flat affect Overall Cognitive Status: Impaired/Different from baseline Area of Impairment: Problem solving                                        Exercises Other Exercises Other Exercises: balance exercises with mod/max HHA.  heel rises X 10, marching  in place X 5 b/l, eyes closed static standing, eyes open with min/mod perturbations    General Comments General comments (skin integrity, edema, etc.): Pt was pleasant and willing to particiate with PT.  Did not need excessive cuing but struggled to vocalize/interact much.  Pt did have slower more guarded ambulation w/o AD (100 ft) w/o LOBs or overt usteadiness, but PT still recommending AD use at d/c.      Pertinent Vitals/Pain Pain Assessment Pain Assessment: No/denies pain    Home Living                          Prior Function            PT Goals (current goals can now be found in the care plan section) Progress towards PT goals: Progressing toward goals    Frequency    Min 1X/week      PT Plan      Co-evaluation              AM-PAC PT "6 Clicks" Mobility   Outcome Measure  Help needed turning from your back to your side while in a flat bed without using bedrails?: None Help needed moving from lying on your back to sitting on the side of a flat bed without using bedrails?: None Help needed moving to and from a bed to a chair (including a wheelchair)?: A Little Help needed standing up from a chair using your arms (e.g., wheelchair or bedside chair)?: A Lot Help needed to walk in hospital room?: A Little Help needed climbing 3-5 steps with a railing? : A Lot 6 Click Score: 18    End of Session Equipment Utilized During Treatment: Gait belt Activity Tolerance: Patient tolerated treatment well Patient left: in chair;with call bell/phone within reach;with family/visitor present Nurse Communication: Mobility status PT Visit Diagnosis: Unsteadiness on feet (R26.81);Muscle weakness (generalized) (M62.81);Other abnormalities of gait and mobility (R26.89);Difficulty in walking, not elsewhere classified (R26.2);Other symptoms and signs involving the nervous system (R29.898)     Time: 4098-1191 PT Time Calculation (min) (ACUTE ONLY): 31 min  Charges:     $Gait Training: 8-22 mins $Therapeutic Exercise: 8-22 mins PT General Charges $$ ACUTE PT VISIT: 1 Visit                     Malachi Pro, DPT 08/23/2022, 11:17 AM

## 2022-08-23 NOTE — Progress Notes (Signed)
Inpatient Rehab Admissions Coordinator:    Pt. Now supervision level and prefers home d/c. CIR will sign off.   Megan Salon, MS, CCC-SLP Rehab Admissions Coordinator  [phone] (celll) [phone] (office)

## 2022-08-25 ENCOUNTER — Telehealth: Payer: Self-pay | Admitting: Internal Medicine

## 2022-08-25 ENCOUNTER — Telehealth: Payer: Self-pay

## 2022-08-25 DIAGNOSIS — R29898 Other symptoms and signs involving the musculoskeletal system: Secondary | ICD-10-CM | POA: Diagnosis not present

## 2022-08-25 DIAGNOSIS — N1831 Chronic kidney disease, stage 3a: Secondary | ICD-10-CM | POA: Diagnosis not present

## 2022-08-25 DIAGNOSIS — I4891 Unspecified atrial fibrillation: Secondary | ICD-10-CM | POA: Diagnosis not present

## 2022-08-25 DIAGNOSIS — I69398 Other sequelae of cerebral infarction: Secondary | ICD-10-CM | POA: Diagnosis not present

## 2022-08-25 DIAGNOSIS — I69328 Other speech and language deficits following cerebral infarction: Secondary | ICD-10-CM | POA: Diagnosis not present

## 2022-08-25 DIAGNOSIS — E1122 Type 2 diabetes mellitus with diabetic chronic kidney disease: Secondary | ICD-10-CM | POA: Diagnosis not present

## 2022-08-25 DIAGNOSIS — I2510 Atherosclerotic heart disease of native coronary artery without angina pectoris: Secondary | ICD-10-CM | POA: Diagnosis not present

## 2022-08-25 DIAGNOSIS — I110 Hypertensive heart disease with heart failure: Secondary | ICD-10-CM | POA: Diagnosis not present

## 2022-08-25 DIAGNOSIS — I5032 Chronic diastolic (congestive) heart failure: Secondary | ICD-10-CM | POA: Diagnosis not present

## 2022-08-25 NOTE — Telephone Encounter (Signed)
Patient needs a TOC follow up where can we work into provider schedule. Discharged on 08.14.2024

## 2022-08-25 NOTE — Transitions of Care (Post Inpatient/ED Visit) (Signed)
08/25/2022  Name: Dylan Robles. MRN: [id number] DOB: [birth year]  Today's TOC FU Call Status: Today's TOC FU Call Status:: Successful TOC FU Call Completed TOC FU Call Complete Date: 08/25/22  Transition Care Management Follow-up Telephone Call Date of Discharge: 08/23/22 Discharge Facility: [facility]) Type of Discharge: Inpatient Admission Primary Inpatient Discharge Diagnosis:: Acute CVA How have you been since you were released from the hospital?: Better (Per patients spouse, he is doing pretty well, getting around okay) Any questions or concerns?: No  Items Reviewed: Did you receive and understand the discharge instructions provided?: Yes Medications obtained,verified, and reconciled?: Yes (Medications Reviewed) Any new allergies since your discharge?: No Dietary orders reviewed?: Yes Type of Diet Ordered:: Low sodium heart healthy Do you have support at home?: Yes People in Home: spouse Name of Support/Comfort Primary Source: [name]  Medications Reviewed Today: Medications Reviewed Today     Reviewed by Jodelle Gross, RN (Case Manager) on 08/25/22 at 1322  Med List Status: <None>   Medication Order Taking? Sig Documenting Provider Last Dose Status Informant  apixaban (ELIQUIS) 2.5 MG TABS tablet [id number] Yes Take 1 tablet (2.5 mg total) by mouth 2 (two) times daily. Wouk, Wilfred Curtis, MD Taking Active   atorvastatin (LIPITOR) 80 MG tablet [id number] Yes TAKE 1 TABLET AT BEDTIME. [name], NP Taking Active Spouse/Significant Other  cetirizine (ZYRTEC) 5 MG tablet [id number] Yes Take 1 tablet (5 mg total) by mouth daily. Billie Lade, MD Taking Active Spouse/Significant Other  Continuous Blood Gluc Sensor (FREESTYLE LIBRE 2 SENSOR) MISC [id number] Yes Use to check glucose at least 4 times daily [name], NP Taking Active Spouse/Significant Other  ezetimibe (ZETIA) 10 MG tablet [id number] Yes TAKE 1 TABLET EVERY  DAY Laurier Nancy, MD Taking Active Spouse/Significant Other  Fish Oil-Vitamin D 1000-1000 MG-UNIT CAPS [id number] Yes Take by mouth once. daily [provider] Taking Active Spouse/Significant Other  furosemide (LASIX) 20 MG tablet [id number] Yes Take 20 mg by mouth as needed. [provider] Taking Active Spouse/Significant Other  gabapentin (NEURONTIN) 300 MG capsule [id number] Yes Take 1 capsule (300 mg total) by mouth 3 (three) times daily. Billie Lade, MD Taking Active Spouse/Significant Other  Glucagon (GVOKE HYPOPEN 2-PACK) 1 MG/0.2ML Ivory Broad [id number] Yes Inject 1 mg into the skin daily as needed. Donell Beers, FNP Taking Active Spouse/Significant Other  insulin aspart (NOVOLOG FLEXPEN) 100 UNIT/ML FlexPen [id number] Yes Inject 10-16 Units into the skin 3 (three) times daily with meals. [name], NP Taking Active Spouse/Significant Other  insulin degludec (TRESIBA FLEXTOUCH) 100 UNIT/ML FlexTouch Pen [id number] Yes Inject 16 Units into the skin at bedtime. [name], NP Taking Active Spouse/Significant Other  isosorbide mononitrate (IMDUR) 60 MG 24 hr tablet [id number] Yes Take 1 tablet by mouth once daily Billie Lade, MD Taking Active Spouse/Significant Other  levothyroxine (SYNTHROID) 88 MCG tablet [id number] Yes TAKE 1 TABLET EVERY DAY BEFORE BREAKFAST Billie Lade, MD Taking Active Spouse/Significant Other  metoprolol succinate (TOPROL-XL) 50 MG 24 hr tablet [id number] Yes TAKE 1 TABLET EVERY DAY Laurier Nancy, MD Taking Active Spouse/Significant Other  spironolactone (ALDACTONE) 25 MG tablet [id number] Yes Take 1 tablet (25 mg total) by mouth daily. Laurier Nancy, MD Taking Active Spouse/Significant Other  tamsulosin (FLOMAX) 0.4 MG CAPS capsule [id number] Yes Take 1 capsule (0.4 mg total) by mouth at bedtime. Billie Lade, MD Taking Active Spouse/Significant Other  triamcinolone cream (KENALOG) 0.1 % [id number] No Apply  1 Application  topically 2 (two) times daily.  Patient not taking: Reported on 08/21/2022   Billie Lade, MD Not Taking Active Spouse/Significant Other            Home Care and Equipment/Supplies: Were Home Health Services Ordered?: Yes Name of Home Health Agency:: [facility] Has Agency set up a time to come to your home?: Yes First Home Health Visit Date: 08/25/22 Any new equipment or medical supplies ordered?: No  Functional Questionnaire: Do you need assistance with bathing/showering or dressing?: Yes Do you need assistance with meal preparation?: Yes Do you need assistance with eating?: No Do you have difficulty maintaining continence: No Do you need assistance with getting out of bed/getting out of a chair/moving?: No Do you have difficulty managing or taking your medications?: Yes  Follow up appointments reviewed: PCP Follow-up appointment confirmed?: Yes Date of PCP follow-up appointment?: 09/01/22 Follow-up Provider: Dr. Durwin Nora Specialist [facility] Follow-up appointment confirmed?: Yes Date of Specialist follow-up appointment?: 08/28/22 Follow-Up Specialty Provider:: Dr. Welton Flakes Do you need transportation to your follow-up appointment?: No Do you understand care options if your condition(s) worsen?: Yes-patient verbalized understanding  SDOH Interventions Today    Flowsheet Row Most Recent Value  SDOH Interventions   Food Insecurity Interventions Intervention Not Indicated  Housing Interventions Intervention Not Indicated  Transportation Interventions Intervention Not Indicated     Jodelle Gross, RN, BSN, CCM Care Management Coordinator [facility] Phone: [phone]/Fax: [phone]

## 2022-08-28 ENCOUNTER — Telehealth: Payer: Self-pay | Admitting: Internal Medicine

## 2022-08-28 ENCOUNTER — Encounter: Payer: Self-pay | Admitting: Cardiovascular Disease

## 2022-08-28 ENCOUNTER — Ambulatory Visit (INDEPENDENT_AMBULATORY_CARE_PROVIDER_SITE_OTHER): Payer: Medicare HMO | Admitting: Cardiovascular Disease

## 2022-08-28 VITALS — BP 121/71 | HR 59 | Ht 78.0 in | Wt 234.0 lb

## 2022-08-28 DIAGNOSIS — I639 Cerebral infarction, unspecified: Secondary | ICD-10-CM

## 2022-08-28 DIAGNOSIS — I739 Peripheral vascular disease, unspecified: Secondary | ICD-10-CM

## 2022-08-28 DIAGNOSIS — I25118 Atherosclerotic heart disease of native coronary artery with other forms of angina pectoris: Secondary | ICD-10-CM

## 2022-08-28 DIAGNOSIS — E1159 Type 2 diabetes mellitus with other circulatory complications: Secondary | ICD-10-CM

## 2022-08-28 DIAGNOSIS — I10 Essential (primary) hypertension: Secondary | ICD-10-CM | POA: Diagnosis not present

## 2022-08-28 DIAGNOSIS — I70213 Atherosclerosis of native arteries of extremities with intermittent claudication, bilateral legs: Secondary | ICD-10-CM | POA: Diagnosis not present

## 2022-08-28 DIAGNOSIS — Z8679 Personal history of other diseases of the circulatory system: Secondary | ICD-10-CM | POA: Diagnosis not present

## 2022-08-28 DIAGNOSIS — I2585 Chronic coronary microvascular dysfunction: Secondary | ICD-10-CM | POA: Diagnosis not present

## 2022-08-28 NOTE — Telephone Encounter (Signed)
Left vm approving orders.

## 2022-08-28 NOTE — Progress Notes (Signed)
Cardiology Office Note   Date:  08/28/2022   ID:  Dylan Primus Domani Kirvin., DOB 10/13/1940, MRN [id number]  PCP:  Billie Lade, MD  Cardiologist:  Adrian Blackwater, MD      History of Present Illness: Dylan Jakob. is a 82 y.o. male who presents for  Chief Complaint  Patient presents with   Follow-up    Feels fine now, after having CVA in [facility] last week      Past Medical History:  Diagnosis Date   Acute lower UTI 09/20/2017   Bradycardia on ECG 07/31/2011   Coronary artery disease    hyperlipidemia   Decreased GFR 09/23/2019   Dehydration 09/21/2017   Diabetes mellitus    Diabetes mellitus without complication (HCC)    Hypercholesterolemia    Hypertension    Hypothyroidism    Left arm pain 01/29/2020   Paronychia of great toe of left foot 09/26/2018   Paronychia of great toe, left 09/26/2018   PERIPHERAL NEUROPATHY 01/13/2006   Qualifier: Diagnosis of  By: Jen Mow MD, Christine     Shortness of breath dyspnea    with exertion   Vasovagal syncope    Weakness 09/20/2017     Past Surgical History:  Procedure Laterality Date   APPENDECTOMY     CARDIAC CATHETERIZATION N/A 06/16/2014   Procedure: Left Heart Cath;  Surgeon: Laurier Nancy, MD;  Location: [facility] INVASIVE CV LAB;  Service: Cardiovascular;  Laterality: N/A;   CIRCUMCISION, NON-NEWBORN     HERNIA REPAIR     LOWER EXTREMITY ANGIOGRAPHY Left 12/24/2018   Procedure: LOWER EXTREMITY ANGIOGRAPHY;  Surgeon: Renford Dills, MD;  Location: [facility] INVASIVE CV LAB;  Service: Cardiovascular;  Laterality: Left;   THYROID SURGERY       Current Outpatient Medications  Medication Sig Dispense Refill   apixaban (ELIQUIS) 2.5 MG TABS tablet Take 1 tablet (2.5 mg total) by mouth 2 (two) times daily. 60 tablet 3   atorvastatin (LIPITOR) 80 MG tablet TAKE 1 TABLET AT BEDTIME. 90 tablet 1   cetirizine (ZYRTEC) 5 MG tablet Take 1 tablet (5 mg total) by mouth daily. 90 tablet 1   Continuous Blood Gluc Sensor (FREESTYLE  LIBRE 2 SENSOR) MISC Use to check glucose at least 4 times daily 6 each 3   ezetimibe (ZETIA) 10 MG tablet TAKE 1 TABLET EVERY DAY 90 tablet 1   Fish Oil-Vitamin D 1000-1000 MG-UNIT CAPS Take by mouth once. daily     furosemide (LASIX) 20 MG tablet Take 20 mg by mouth as needed.     gabapentin (NEURONTIN) 300 MG capsule Take 1 capsule (300 mg total) by mouth 3 (three) times daily. 270 capsule 1   Glucagon (GVOKE HYPOPEN 2-PACK) 1 MG/0.2ML SOAJ Inject 1 mg into the skin daily as needed. 0.4 mL 1   insulin aspart (NOVOLOG FLEXPEN) 100 UNIT/ML FlexPen Inject 10-16 Units into the skin 3 (three) times daily with meals. 30 mL 3   insulin degludec (TRESIBA FLEXTOUCH) 100 UNIT/ML FlexTouch Pen Inject 16 Units into the skin at bedtime. 15 mL 3   isosorbide mononitrate (IMDUR) 60 MG 24 hr tablet Take 1 tablet by mouth once daily 30 tablet 0   levothyroxine (SYNTHROID) 88 MCG tablet TAKE 1 TABLET EVERY DAY BEFORE BREAKFAST 90 tablet 3   metoprolol succinate (TOPROL-XL) 50 MG 24 hr tablet TAKE 1 TABLET EVERY DAY 90 tablet 3   spironolactone (ALDACTONE) 25 MG tablet Take 1 tablet (25 mg total) by mouth daily. 30  tablet 11   tamsulosin (FLOMAX) 0.4 MG CAPS capsule Take 1 capsule (0.4 mg total) by mouth at bedtime. 90 capsule 1   triamcinolone cream (KENALOG) 0.1 % Apply 1 Application topically 2 (two) times daily. (Patient not taking: Reported on 08/21/2022) 15 g 1   No current facility-administered medications for this visit.    Allergies:   Plavix [clopidogrel]    Social History:   reports that he has quit smoking. He has never used smokeless tobacco. He reports that he does not drink alcohol and does not use drugs.   Family History:  family history includes Cancer in his mother; Diabetes in his brother and father; Hypertension in his father.    ROS:     Review of Systems  Constitutional: Negative.   HENT: Negative.    Eyes: Negative.   Respiratory: Negative.    Gastrointestinal: Negative.    Genitourinary: Negative.   Musculoskeletal: Negative.   Skin: Negative.   Neurological: Negative.   Endo/Heme/Allergies: Negative.   Psychiatric/Behavioral: Negative.    All other systems reviewed and are negative.     All other systems are reviewed and negative.    PHYSICAL EXAM: VS:  BP 121/71   Pulse (!) 59   Ht 6\' 6"  (1.981 m)   Wt 234 lb (106.1 kg)   SpO2 95%   BMI 27.04 kg/m  , BMI Body mass index is 27.04 kg/m. Last weight:  Wt Readings from Last 3 Encounters:  08/28/22 234 lb (106.1 kg)  08/20/22 237 lb 3.4 oz (107.6 kg)  08/18/22 236 lb 6.4 oz (107.2 kg)     Physical Exam Vitals reviewed.  Constitutional:      Appearance: Normal appearance. He is normal weight.  HENT:     Head: Normocephalic.     Nose: Nose normal.     Mouth/Throat:     Mouth: Mucous membranes are moist.  Eyes:     Pupils: Pupils are equal, round, and reactive to light.  Cardiovascular:     Rate and Rhythm: Normal rate and regular rhythm.     Pulses: Normal pulses.     Heart sounds: Normal heart sounds.  Pulmonary:     Effort: Pulmonary effort is normal.  Abdominal:     General: Abdomen is flat. Bowel sounds are normal.  Musculoskeletal:        General: Normal range of motion.     Cervical back: Normal range of motion.  Skin:    General: Skin is warm.  Neurological:     General: No focal deficit present.     Mental Status: He is alert.  Psychiatric:        Mood and Affect: Mood normal.       EKG:   Recent Labs: 08/20/2022: ALT 28; B Natriuretic Peptide 439.8; BUN 35; Potassium 4.1; Sodium 135; TSH 1.510 08/22/2022: Creatinine, Ser 1.68; Hemoglobin 10.6; Platelets 190    Lipid Panel    Component Value Date/Time   CHOL 126 08/21/2022 0502   CHOL 125 05/10/2022 0917   TRIG 74 08/21/2022 0502   HDL 34 (L) 08/21/2022 0502   HDL 46 05/10/2022 0917   CHOLHDL 3.7 08/21/2022 0502   VLDL 15 08/21/2022 0502   LDLCALC 77 08/21/2022 0502   LDLCALC 65 05/10/2022 0917    LDLCALC 73 11/03/2020 1032      Other studies Reviewed: Additional studies/ records that were reviewed today include:  Review of the above records demonstrates:       No data to  display            ASSESSMENT AND PLAN:    ICD-10-CM   1. Acute CVA (cerebrovascular accident) (HCC)  I63.9 MYOCARDIAL PERFUSION IMAGING   Admitted in [facility] last week with right sideded weakness, dysartria, but improved now.    2. Atherosclerosis of native artery of both lower extremities with intermittent claudication (HCC)  I70.213 MYOCARDIAL PERFUSION IMAGING    3. Chronic coronary microvascular dysfunction  I25.85 MYOCARDIAL PERFUSION IMAGING    4. Coronary artery disease of native artery of native heart with stable angina pectoris (HCC)  I25.118 MYOCARDIAL PERFUSION IMAGING   will reshedual stress test    5. Essential hypertension  I10 MYOCARDIAL PERFUSION IMAGING    6. PERIPHERAL VASCULAR DISEASE  I73.9 MYOCARDIAL PERFUSION IMAGING    7. Type 2 diabetes mellitus with vascular disease (HCC)  E11.59 MYOCARDIAL PERFUSION IMAGING       Problem List Items Addressed This Visit       Cardiovascular and Mediastinum   Essential hypertension   Relevant Orders   MYOCARDIAL PERFUSION IMAGING   PERIPHERAL VASCULAR DISEASE   Relevant Orders   MYOCARDIAL PERFUSION IMAGING   Coronary artery disease of native artery of native heart with stable angina pectoris (HCC)   Relevant Orders   MYOCARDIAL PERFUSION IMAGING   Type 2 diabetes mellitus with vascular disease (HCC)   Relevant Orders   MYOCARDIAL PERFUSION IMAGING   Atherosclerosis of native arteries of extremity with intermittent claudication (HCC)   Relevant Orders   MYOCARDIAL PERFUSION IMAGING   CAD (coronary artery disease)   Relevant Orders   MYOCARDIAL PERFUSION IMAGING   MYOCARDIAL PERFUSION IMAGING   Acute CVA (cerebrovascular accident) (HCC) - Primary   Relevant Orders   MYOCARDIAL PERFUSION IMAGING       Disposition:    Return in about 2 weeks (around 09/11/2022) for reschedual stress test as was in hospital and missed appoitment and f/u.    Total time spent: 30 minutes  Signed,  Adrian Blackwater, MD  08/28/2022 11:14 AM    [facility]

## 2022-08-28 NOTE — Telephone Encounter (Signed)
Joey, Physical Therapist w. [facility]   Verbal orders for POC for physical therapy   1 week 3   Also requesting speech pathologist evaluation   Call back [phone] Can leave voicemail, secure line.

## 2022-08-28 NOTE — Telephone Encounter (Signed)
Toc call has been done.

## 2022-08-29 ENCOUNTER — Other Ambulatory Visit: Payer: Medicare HMO

## 2022-08-30 ENCOUNTER — Telehealth: Payer: Self-pay | Admitting: Cardiovascular Disease

## 2022-08-30 ENCOUNTER — Emergency Department: Payer: Medicare HMO

## 2022-08-30 ENCOUNTER — Other Ambulatory Visit: Payer: Self-pay

## 2022-08-30 ENCOUNTER — Emergency Department
Admission: EM | Admit: 2022-08-30 | Discharge: 2022-08-30 | Disposition: A | Discharge status: Home / Self Care | Payer: Medicare HMO | Attending: Emergency Medicine | Admitting: Emergency Medicine

## 2022-08-30 DIAGNOSIS — R001 Bradycardia, unspecified: Secondary | ICD-10-CM | POA: Insufficient documentation

## 2022-08-30 DIAGNOSIS — I5032 Chronic diastolic (congestive) heart failure: Secondary | ICD-10-CM | POA: Diagnosis not present

## 2022-08-30 DIAGNOSIS — E1122 Type 2 diabetes mellitus with diabetic chronic kidney disease: Secondary | ICD-10-CM | POA: Diagnosis not present

## 2022-08-30 DIAGNOSIS — R4781 Slurred speech: Secondary | ICD-10-CM | POA: Diagnosis not present

## 2022-08-30 DIAGNOSIS — R29898 Other symptoms and signs involving the musculoskeletal system: Secondary | ICD-10-CM | POA: Diagnosis not present

## 2022-08-30 DIAGNOSIS — R29810 Facial weakness: Secondary | ICD-10-CM | POA: Diagnosis not present

## 2022-08-30 DIAGNOSIS — I2510 Atherosclerotic heart disease of native coronary artery without angina pectoris: Secondary | ICD-10-CM | POA: Diagnosis not present

## 2022-08-30 DIAGNOSIS — I69398 Other sequelae of cerebral infarction: Secondary | ICD-10-CM | POA: Diagnosis not present

## 2022-08-30 DIAGNOSIS — I4891 Unspecified atrial fibrillation: Secondary | ICD-10-CM | POA: Diagnosis not present

## 2022-08-30 DIAGNOSIS — I69328 Other speech and language deficits following cerebral infarction: Secondary | ICD-10-CM | POA: Diagnosis not present

## 2022-08-30 DIAGNOSIS — N1831 Chronic kidney disease, stage 3a: Secondary | ICD-10-CM | POA: Diagnosis not present

## 2022-08-30 DIAGNOSIS — I110 Hypertensive heart disease with heart failure: Secondary | ICD-10-CM | POA: Diagnosis not present

## 2022-08-30 DIAGNOSIS — R0902 Hypoxemia: Secondary | ICD-10-CM | POA: Diagnosis not present

## 2022-08-30 LAB — CBC
HCT: 35.1 % — ABNORMAL LOW (ref 39.0–52.0)
Hemoglobin: 11.4 g/dL — ABNORMAL LOW (ref 13.0–17.0)
MCH: 29.8 pg (ref 26.0–34.0)
MCHC: 32.5 g/dL (ref 30.0–36.0)
MCV: 91.6 fL (ref 80.0–100.0)
Platelets: 259 10*3/uL (ref 150–400)
RBC: 3.83 MIL/uL — ABNORMAL LOW (ref 4.22–5.81)
RDW: 14.6 % (ref 11.5–15.5)
WBC: 7.3 10*3/uL (ref 4.0–10.5)
nRBC: 0 % (ref 0.0–0.2)

## 2022-08-30 LAB — BASIC METABOLIC PANEL
Anion gap: 6 (ref 5–15)
BUN: 34 mg/dL — ABNORMAL HIGH (ref 8–23)
CO2: 22 mmol/L (ref 22–32)
Calcium: 9.4 mg/dL (ref 8.9–10.3)
Chloride: 106 mmol/L (ref 98–111)
Creatinine, Ser: 1.71 mg/dL — ABNORMAL HIGH (ref 0.61–1.24)
GFR, Estimated: 39 mL/min — ABNORMAL LOW (ref >60)
Glucose, Bld: 138 mg/dL — ABNORMAL HIGH (ref 70–99)
Potassium: 4.9 mmol/L (ref 3.5–5.1)
Sodium: 134 mmol/L — ABNORMAL LOW (ref 135–145)

## 2022-08-30 LAB — TROPONIN I (HIGH SENSITIVITY): Troponin I (High Sensitivity): 10 ng/L (ref <18)

## 2022-08-30 NOTE — ED Provider Notes (Signed)
[facility] Provider Note  Patient Contact: 6:27 PM (approximate)   History   Bradycardia   HPI  Dylan Robles. is a 82 y.o. male who presents the emergency department for evaluation of possible bradycardia.  Patient states that roughly a week and a half ago he had a stroke, he has been at home but having at home PT show up.  Patient states that the PT was taking his vitals prior to his session and it was noted that his heart rate was 47.  Patient states he has no chest pain, shortness of breath, lightheaded, dizziness, palpitations.  He states that he feels at his baseline.  Patient has known A-fib, states that he has had no known issues at home with his A-fib.  He does take 50 of metoprolol every day but there have been no changes to his hypertensive medications nor adding any meds that should be chronotropic in nature.  Patient has again no symptoms from this, was advised to present for evaluation given the at home reading.  Patient's bedside monitor was reading everything from 32-70.  While talking with the patient, I mainly took his pulse rate over 30 seconds.  Patient had periods where he was definitely slower correlating with the monitoring of 32 though his actual heart rate with his A-fib is currently 72 bpm at bedside.  Patient denies any headache, visual changes, increased slurred speech or unilateral weakness.  Patient does have some residual slurred speech from the CVA.  Again no chest pain, fevers, chills, URI symptoms.  There is been no emesis, diarrhea, constipation.  No urinary changes.     Physical Exam   Triage Vital Signs: ED Triage Vitals  Encounter Vitals Group     BP 08/30/22 1505 (!) 178/79     Systolic BP Percentile --      Diastolic BP Percentile --      Pulse Rate 08/30/22 1505 (!) 57     Resp 08/30/22 1505 18     Temp 08/30/22 1505 (!) 97.5 F (36.4 C)     Temp src --      SpO2 08/30/22 1505 100 %     Weight 08/30/22  1502 233 lb 11 oz (106 kg)     Height 08/30/22 1502 6\' 6"  (1.981 m)     Head Circumference --      Peak Flow --      Pain Score 08/30/22 1502 0     Pain Loc --      Pain Education --      Exclude from Growth Chart --     Most recent vital signs: Vitals:   08/30/22 1505  BP: (!) 178/79  Pulse: (!) 57  Resp: 18  Temp: (!) 97.5 F (36.4 C)  SpO2: 100%     General: Alert and in no acute distress. ENT:      Ears:       Nose: No congestion/rhinnorhea.      Mouth/Throat: Mucous membranes are moist. Neck: No stridor. No cervical spine tenderness to palpation.  Cardiovascular:  Good peripheral perfusion.  Auscultation reveals no appreciable murmurs, rubs, gallops.  Patient has an irregular irregular heartbeat consistent with his A-fib.  Auscultation reveals a heart rate of 60 bpm.  Prior to auscultation palpation of the heartbeat revealed a rate of 72 bpm.  Patient electronic pulse ox is ranging from 32-70 at bedside. Respiratory: Normal respiratory effort without tachypnea or retractions. Lungs CTAB. Good air entry to  the bases with no decreased or absent breath sounds. Musculoskeletal: Full range of motion to all extremities.  Neurologic:  No gross focal neurologic deficits are appreciated.  Skin:   No rash noted Other:   ED Results / Procedures / Treatments   Labs (all labs ordered are listed, but only abnormal results are displayed) Labs Reviewed  BASIC METABOLIC PANEL - Abnormal; Notable for the following components:      Result Value   Sodium 134 (*)    Glucose, Bld 138 (*)    BUN 34 (*)    Creatinine, Ser 1.71 (*)    GFR, Estimated 39 (*)    All other components within normal limits  CBC - Abnormal; Notable for the following components:   RBC 3.83 (*)    Hemoglobin 11.4 (*)    HCT 35.1 (*)    All other components within normal limits  TROPONIN I (HIGH SENSITIVITY)     EKG  ED ECG REPORT I, Delorise Royals Tiwanda Threats,  personally viewed and interpreted this  ECG.   Date: 08/30/2022  EKG Time: 1512 hrs.  Rate: 47 bpm  Rhythm: sinus bradycardia  Axis: Normal axis  Intervals:none  ST&T Change: No gross ST elevation or depression noted  Sinus bradycardia, no STEMI.  Patient still in A-fib.  Compared to previous EKGs, patient has had heart rate anywhere from the 50s to the low 100s.  No significant change    RADIOLOGY  I personally viewed, evaluated, and interpreted these images as part of my medical decision making, as well as reviewing the written report by the radiologist.  ED Provider Interpretation: No acute cardiopulmonary findings  DG Chest 2 View  Result Date: 08/30/2022 CLINICAL DATA:  Bradycardia. EXAM: CHEST - 2 VIEW COMPARISON:  December 31, 2020 FINDINGS: The heart size and mediastinal contours are within normal limits. There is moderate severity calcification of the aortic arch. Both lungs are clear. The visualized skeletal structures are unremarkable. IMPRESSION: No active cardiopulmonary disease. Electronically Signed   By: Aram Candela M.D.   On: 08/30/2022 19:43    PROCEDURES:  Critical Care performed: No  Procedures   MEDICATIONS ORDERED IN ED: Medications - No data to display   IMPRESSION / MDM / ASSESSMENT AND PLAN / ED COURSE  I reviewed the triage vital signs and the nursing notes.                                 Differential diagnosis includes, but is not limited to, bradycardia, sick sinus syndrome, dehydration, STEMI/ACS   Patient's presentation is most consistent with acute presentation with potential threat to life or bodily function.   Patient's diagnosis is consistent with bradycardia.  Patient presented to the emergency department with bradycardia identified at home.  Patient had a stroke roughly 10 days ago, has been largely sedentary since this event.  Today he was supposed to have at home physical therapy and prior to the session the physical therapist noted that the patient's pulse rate  was 47.  Patient arrives with an initial heart rate of 57, subsequent EKG revealed a heart rate of 47.  He is in A-fib.  At bedside, I took the patient's pulse for 30 seconds, he has been in the low 60s to low 70s when taken his pulse for 30 seconds.  Taking his pulse for a few sporadic 10-second intervals has led findings from the low 40s to the mid 50s.  However prolonged checking of patient's pulse for at least 30 seconds has revealed a heart rate that is in the 60s and 70s.  I suspect that this bradycardia is partially due to his ongoing metoprolol use, increased sedentary lifestyle over the last 10 days.  I have recommended decreasing the metoprolol by half.  Watch the patient's heart rate at home and recommended manual checks of the patient's heart rate as during my assessment the monitor was giving me different readings despite my actual physical check of the heart rate.  Patient is asymptomatic at this time and I do not feel any need for.  Return precautions discussed for any symptomatic bradycardic findings.  Follow-up primary care as needed..  Patient is stable for discharge at this time.  Patient is given ED precautions to return to the ED for any worsening or new symptoms.     FINAL CLINICAL IMPRESSION(S) / ED DIAGNOSES   Final diagnoses:  Bradycardia     Rx / DC Orders   ED Discharge Orders     None        Note:  This document was prepared using Dragon voice recognition software and may include unintentional dictation errors.   Lanette Hampshire 08/30/22 2031    Phineas Semen, MD 08/30/22 2036

## 2022-08-30 NOTE — Telephone Encounter (Signed)
Dylan Robles, OT with [facility] called regarding the patient has resting HR of 46 and BP of 112/60. Patient has no complaints or symptoms. Home health is instructed to contact us when HR is below 50. Advised Don to recheck HR and if doesn't rise back up then he may call 911 when asked if he should call 911.   Just FYI.

## 2022-08-30 NOTE — ED Triage Notes (Addendum)
First nurse note: Pt to ED via [facility] from home. Pt had CNA checking on him that saw his Hr was 45. Pt was seen on 8/11 after stroke. No new cardiac medications. Pt denies weakness, pain, dizziness. Pt reports baseline speech difficulty since stroke.   EMS VS:  176/82 HR 45-50

## 2022-08-31 DIAGNOSIS — I69398 Other sequelae of cerebral infarction: Secondary | ICD-10-CM | POA: Diagnosis not present

## 2022-08-31 DIAGNOSIS — N1831 Chronic kidney disease, stage 3a: Secondary | ICD-10-CM | POA: Diagnosis not present

## 2022-08-31 DIAGNOSIS — I5032 Chronic diastolic (congestive) heart failure: Secondary | ICD-10-CM | POA: Diagnosis not present

## 2022-08-31 DIAGNOSIS — E1122 Type 2 diabetes mellitus with diabetic chronic kidney disease: Secondary | ICD-10-CM | POA: Diagnosis not present

## 2022-08-31 DIAGNOSIS — I110 Hypertensive heart disease with heart failure: Secondary | ICD-10-CM | POA: Diagnosis not present

## 2022-08-31 DIAGNOSIS — I2510 Atherosclerotic heart disease of native coronary artery without angina pectoris: Secondary | ICD-10-CM | POA: Diagnosis not present

## 2022-08-31 DIAGNOSIS — I4891 Unspecified atrial fibrillation: Secondary | ICD-10-CM | POA: Diagnosis not present

## 2022-08-31 DIAGNOSIS — I69328 Other speech and language deficits following cerebral infarction: Secondary | ICD-10-CM | POA: Diagnosis not present

## 2022-08-31 DIAGNOSIS — R29898 Other symptoms and signs involving the musculoskeletal system: Secondary | ICD-10-CM | POA: Diagnosis not present

## 2022-08-31 NOTE — Telephone Encounter (Signed)
Dylan Robles has no openings (20 min slots OR same day slots) I don't see anywhere to put this pt. He would have to approve a double booking

## 2022-09-01 ENCOUNTER — Ambulatory Visit: Payer: Medicare HMO | Admitting: Cardiovascular Disease

## 2022-09-01 ENCOUNTER — Encounter: Payer: Self-pay | Admitting: Internal Medicine

## 2022-09-01 ENCOUNTER — Ambulatory Visit (INDEPENDENT_AMBULATORY_CARE_PROVIDER_SITE_OTHER): Payer: Medicare HMO | Admitting: Internal Medicine

## 2022-09-01 VITALS — BP 118/66 | HR 58 | Ht 78.0 in | Wt 234.6 lb

## 2022-09-01 DIAGNOSIS — E1159 Type 2 diabetes mellitus with other circulatory complications: Secondary | ICD-10-CM

## 2022-09-01 DIAGNOSIS — I10 Essential (primary) hypertension: Secondary | ICD-10-CM

## 2022-09-01 DIAGNOSIS — R0602 Shortness of breath: Secondary | ICD-10-CM | POA: Diagnosis not present

## 2022-09-01 DIAGNOSIS — I639 Cerebral infarction, unspecified: Secondary | ICD-10-CM | POA: Diagnosis not present

## 2022-09-01 DIAGNOSIS — I4891 Unspecified atrial fibrillation: Secondary | ICD-10-CM

## 2022-09-01 NOTE — Assessment & Plan Note (Signed)
Pending NMST.  This is scheduled for 8/27

## 2022-09-01 NOTE — Assessment & Plan Note (Signed)
Recent diagnosis.  Regular rate and rhythm detected on exam today.  Cardioembolic source of recent CVA suspected.  He was discharged on Eliquis and Toprol-XL 25 mg daily.  No medication changes are indicated today.  Recently evaluated by cardiology for follow-up.

## 2022-09-01 NOTE — Assessment & Plan Note (Signed)
Followed by endocrinology.  Poorly controlled.  A1c 10.1 on labs from earlier this month.  His current insulin regimen consists of Tresiba 16 units nightly and NovoLog 10 to 16 units 3 times daily with meals.  His wife checks his blood sugar regularly and reports readings around 120.  The highest reading she has recorded since hospital discharge is 140. -Patient's wife will contact endocrinology to arrange follow-up

## 2022-09-01 NOTE — Assessment & Plan Note (Signed)
Adequately controlled on current antihypertensive regimen.  No changes are indicated today.

## 2022-09-01 NOTE — Progress Notes (Signed)
Established Patient Office Visit  Subjective   Patient ID: Dylan Basha., male    DOB: July 21, 1940  Age: 82 y.o. MRN: [id number]  Chief Complaint  Patient presents with   Hospitalization Follow-up    Hospital follow for stroke    Numbness    Fingers in both hands    Dylan Robles returns to care today for hospital follow-up in the setting of recent admission for CVA.  He initially presented to [facility] ED on 8/11 endorsing right-sided weakness, AMS, and expressive aphasia.  Stroke alert called.  Workup ultimately revealed acute CVA acute ischemia at the posterior left insula.  Multiple old small vessel infarcts of the deep white matter were noted as well.  Cardioembolic source suspected as new onset A-fib was identified as well.  He was discharged on Eliquis and statin therapy.  He made significant progress during his admission and was discharged with home health PT orders.  In the interim, he has been evaluated by cardiology for follow-up.  He also presented to the emergency department on 8/21 with concern for bradycardia.  Workup at that time was reassuring.  Today Dylan Robles states that he feels well.  He is accompanied by his wife.  They do not have acute concerns to discuss today.  Past Medical History:  Diagnosis Date   Acute lower UTI 09/20/2017   Bradycardia on ECG 07/31/2011   Coronary artery disease    hyperlipidemia   Decreased GFR 09/23/2019   Dehydration 09/21/2017   Diabetes mellitus    Diabetes mellitus without complication (HCC)    Hypercholesterolemia    Hypertension    Hypothyroidism    Left arm pain 01/29/2020   Paronychia of great toe of left foot 09/26/2018   Paronychia of great toe, left 09/26/2018   PERIPHERAL NEUROPATHY 01/13/2006   Qualifier: Diagnosis of  By: Jen Mow MD, Christine     Shortness of breath dyspnea    with exertion   Vasovagal syncope    Weakness 09/20/2017   Past Surgical History:  Procedure Laterality Date   APPENDECTOMY     CARDIAC  CATHETERIZATION N/A 06/16/2014   Procedure: Left Heart Cath;  Surgeon: Laurier Nancy, MD;  Location: [facility] INVASIVE CV LAB;  Service: Cardiovascular;  Laterality: N/A;   CIRCUMCISION, NON-NEWBORN     HERNIA REPAIR     LOWER EXTREMITY ANGIOGRAPHY Left 12/24/2018   Procedure: LOWER EXTREMITY ANGIOGRAPHY;  Surgeon: Renford Dills, MD;  Location: [facility] INVASIVE CV LAB;  Service: Cardiovascular;  Laterality: Left;   THYROID SURGERY     Social History   Tobacco Use   Smoking status: Former   Smokeless tobacco: Never  Vaping Use   Vaping status: Never Used  Substance Use Topics   Alcohol use: No   Drug use: No   Family History  Problem Relation Age of Onset   Diabetes Father    Hypertension Father    Cancer Mother    Diabetes Brother    Allergies  Allergen Reactions   Plavix [Clopidogrel] Hives   Review of Systems  Neurological:  Positive for tingling (Numbness/tingling in the right hand).  All other systems reviewed and are negative.    Objective:     BP 118/66 (BP Location: Left Arm, Patient Position: Sitting, Cuff Size: Normal)   Pulse (!) 58   Ht 6\' 6"  (1.981 m)   Wt 234 lb 9.6 oz (106.4 kg)   SpO2 97%   BMI 27.11 kg/m  BP Readings from Last 3 Encounters:  09/01/22 118/66  08/30/22 (!) 189/89  08/28/22 121/71   Physical Exam Vitals reviewed.  Constitutional:      General: He is not in acute distress.    Appearance: Normal appearance. He is not ill-appearing.  HENT:     Head: Normocephalic and atraumatic.     Right Ear: External ear normal.     Left Ear: External ear normal.     Nose: Nose normal. No congestion or rhinorrhea.     Mouth/Throat:     Mouth: Mucous membranes are moist.     Pharynx: Oropharynx is clear.  Eyes:     General: No scleral icterus.    Extraocular Movements: Extraocular movements intact.     Conjunctiva/sclera: Conjunctivae normal.     Pupils: Pupils are equal, round, and reactive to light.  Cardiovascular:     Rate and Rhythm:  Normal rate and regular rhythm.     Pulses: Normal pulses.     Heart sounds: Normal heart sounds. No murmur heard. Pulmonary:     Effort: Pulmonary effort is normal.     Breath sounds: Normal breath sounds. No wheezing, rhonchi or rales.  Abdominal:     General: Abdomen is flat. Bowel sounds are normal. There is no distension.     Palpations: Abdomen is soft.     Tenderness: There is no abdominal tenderness.  Musculoskeletal:        General: No swelling or deformity. Normal range of motion.     Cervical back: Normal range of motion.  Skin:    General: Skin is warm and dry.     Capillary Refill: Capillary refill takes less than 2 seconds.  Neurological:     General: No focal deficit present.     Mental Status: He is alert and oriented to person, place, and time.     Sensory: No sensory deficit.     Motor: No weakness.     Gait: Gait abnormal (Ambulates with walker).  Psychiatric:        Mood and Affect: Mood normal.        Behavior: Behavior normal.        Thought Content: Thought content normal.   Last CBC Lab Results  Component Value Date   WBC 7.3 08/30/2022   HGB 11.4 (L) 08/30/2022   HCT 35.1 (L) 08/30/2022   MCV 91.6 08/30/2022   MCH 29.8 08/30/2022   RDW 14.6 08/30/2022   PLT 259 08/30/2022   Last metabolic panel Lab Results  Component Value Date   GLUCOSE 138 (H) 08/30/2022   NA 134 (L) 08/30/2022   K 4.9 08/30/2022   CL 106 08/30/2022   CO2 22 08/30/2022   BUN 34 (H) 08/30/2022   CREATININE 1.71 (H) 08/30/2022   GFRNONAA 39 (L) 08/30/2022   CALCIUM 9.4 08/30/2022   PROT 6.4 (L) 08/20/2022   ALBUMIN 3.3 (L) 08/20/2022   LABGLOB 2.1 05/10/2022   AGRATIO 1.9 05/10/2022   BILITOT 0.8 08/20/2022   ALKPHOS 95 08/20/2022   AST 24 08/20/2022   ALT 28 08/20/2022   ANIONGAP 6 08/30/2022   Last lipids Lab Results  Component Value Date   CHOL 126 08/21/2022   HDL 34 (L) 08/21/2022   LDLCALC 77 08/21/2022   TRIG 74 08/21/2022   CHOLHDL 3.7 08/21/2022    Last hemoglobin A1c Lab Results  Component Value Date   HGBA1C 10.1 (H) 08/21/2022   Last thyroid functions Lab Results  Component Value Date   TSH 1.510 08/20/2022   Last  vitamin D Lab Results  Component Value Date   VD25OH 28.8 (L) 05/10/2022   Last vitamin B12 and Folate Lab Results  Component Value Date   VITAMINB12 490 11/10/2021   FOLATE 14.3 11/10/2021     Assessment & Plan:   Problem List Items Addressed This Visit       Essential hypertension    Adequately controlled on current antihypertensive regimen.  No changes are indicated today.      Type 2 diabetes mellitus with vascular disease (HCC)    Followed by endocrinology.  Poorly controlled.  A1c 10.1 on labs from earlier this month.  His current insulin regimen consists of Tresiba 16 units nightly and NovoLog 10 to 16 units 3 times daily with meals.  His wife checks his blood sugar regularly and reports readings around 120.  The highest reading she has recorded since hospital discharge is 140. -Patient's wife will contact endocrinology to arrange follow-up      New onset atrial fibrillation (HCC) - Primary    Recent diagnosis.  Regular rate and rhythm detected on exam today.  Cardioembolic source of recent CVA suspected.  He was discharged on Eliquis and Toprol-XL 25 mg daily.  No medication changes are indicated today.  Recently evaluated by cardiology for follow-up.      Acute CVA (cerebrovascular accident) [facility])    Presenting today for hospital follow-up in the setting of recent CVA.  He initially presented with right-sided weakness, AMS, and expressive aphasia.  No neurologic deficits are appreciated on exam today.  He was referred to neurology upon hospital discharge.  Discharged on Eliquis and atorvastatin.  Dual antiplatelet therapy was discontinued.  He is working with home health PT. -Pending neurology follow-up -No medication changes indicated today      SOB (shortness of breath)    Pending NMST.   This is scheduled for 8/27      Return in about 3 months (around 12/02/2022).   Billie Lade, MD

## 2022-09-01 NOTE — Telephone Encounter (Signed)
On schedule for 08.23.2024.

## 2022-09-01 NOTE — Patient Instructions (Signed)
It was a pleasure to see you today.  Thank you for giving [place] the opportunity to be involved in your care.  Below is a brief recap of your visit and next steps.  We will plan to see you again in 3 months.  Summary No medication changes today Please follow up with endocrinology for diabetes management Follow up in 3 months

## 2022-09-01 NOTE — Assessment & Plan Note (Signed)
Presenting today for hospital follow-up in the setting of recent CVA.  He initially presented with right-sided weakness, AMS, and expressive aphasia.  No neurologic deficits are appreciated on exam today.  He was referred to neurology upon hospital discharge.  Discharged on Eliquis and atorvastatin.  Dual antiplatelet therapy was discontinued.  He is working with home health PT. -Pending neurology follow-up -No medication changes indicated today

## 2022-09-04 DIAGNOSIS — I69328 Other speech and language deficits following cerebral infarction: Secondary | ICD-10-CM | POA: Diagnosis not present

## 2022-09-04 DIAGNOSIS — I4891 Unspecified atrial fibrillation: Secondary | ICD-10-CM | POA: Diagnosis not present

## 2022-09-04 DIAGNOSIS — I69398 Other sequelae of cerebral infarction: Secondary | ICD-10-CM | POA: Diagnosis not present

## 2022-09-04 DIAGNOSIS — R29898 Other symptoms and signs involving the musculoskeletal system: Secondary | ICD-10-CM | POA: Diagnosis not present

## 2022-09-04 DIAGNOSIS — E1122 Type 2 diabetes mellitus with diabetic chronic kidney disease: Secondary | ICD-10-CM | POA: Diagnosis not present

## 2022-09-04 DIAGNOSIS — I110 Hypertensive heart disease with heart failure: Secondary | ICD-10-CM | POA: Diagnosis not present

## 2022-09-04 DIAGNOSIS — I5032 Chronic diastolic (congestive) heart failure: Secondary | ICD-10-CM | POA: Diagnosis not present

## 2022-09-04 DIAGNOSIS — N1831 Chronic kidney disease, stage 3a: Secondary | ICD-10-CM | POA: Diagnosis not present

## 2022-09-04 DIAGNOSIS — I2510 Atherosclerotic heart disease of native coronary artery without angina pectoris: Secondary | ICD-10-CM | POA: Diagnosis not present

## 2022-09-05 ENCOUNTER — Inpatient Hospital Stay: Payer: Medicare HMO | Admitting: Family Medicine

## 2022-09-05 DIAGNOSIS — I700 Atherosclerosis of aorta: Secondary | ICD-10-CM

## 2022-09-05 DIAGNOSIS — N179 Acute kidney failure, unspecified: Secondary | ICD-10-CM | POA: Diagnosis not present

## 2022-09-05 DIAGNOSIS — I69328 Other speech and language deficits following cerebral infarction: Secondary | ICD-10-CM | POA: Diagnosis not present

## 2022-09-05 DIAGNOSIS — N1831 Chronic kidney disease, stage 3a: Secondary | ICD-10-CM | POA: Diagnosis not present

## 2022-09-05 DIAGNOSIS — N189 Chronic kidney disease, unspecified: Secondary | ICD-10-CM | POA: Diagnosis not present

## 2022-09-05 DIAGNOSIS — R29898 Other symptoms and signs involving the musculoskeletal system: Secondary | ICD-10-CM | POA: Diagnosis not present

## 2022-09-05 DIAGNOSIS — I110 Hypertensive heart disease with heart failure: Secondary | ICD-10-CM | POA: Diagnosis not present

## 2022-09-05 DIAGNOSIS — I5032 Chronic diastolic (congestive) heart failure: Secondary | ICD-10-CM | POA: Diagnosis not present

## 2022-09-05 DIAGNOSIS — I4891 Unspecified atrial fibrillation: Secondary | ICD-10-CM | POA: Diagnosis not present

## 2022-09-05 DIAGNOSIS — E1142 Type 2 diabetes mellitus with diabetic polyneuropathy: Secondary | ICD-10-CM

## 2022-09-05 DIAGNOSIS — I69398 Other sequelae of cerebral infarction: Secondary | ICD-10-CM | POA: Diagnosis not present

## 2022-09-05 DIAGNOSIS — I2510 Atherosclerotic heart disease of native coronary artery without angina pectoris: Secondary | ICD-10-CM | POA: Diagnosis not present

## 2022-09-05 DIAGNOSIS — I083 Combined rheumatic disorders of mitral, aortic and tricuspid valves: Secondary | ICD-10-CM

## 2022-09-05 DIAGNOSIS — E1122 Type 2 diabetes mellitus with diabetic chronic kidney disease: Secondary | ICD-10-CM | POA: Diagnosis not present

## 2022-09-07 ENCOUNTER — Telehealth: Payer: Self-pay

## 2022-09-07 DIAGNOSIS — I2510 Atherosclerotic heart disease of native coronary artery without angina pectoris: Secondary | ICD-10-CM | POA: Diagnosis not present

## 2022-09-07 DIAGNOSIS — I69328 Other speech and language deficits following cerebral infarction: Secondary | ICD-10-CM | POA: Diagnosis not present

## 2022-09-07 DIAGNOSIS — I110 Hypertensive heart disease with heart failure: Secondary | ICD-10-CM | POA: Diagnosis not present

## 2022-09-07 DIAGNOSIS — I69398 Other sequelae of cerebral infarction: Secondary | ICD-10-CM | POA: Diagnosis not present

## 2022-09-07 DIAGNOSIS — R29898 Other symptoms and signs involving the musculoskeletal system: Secondary | ICD-10-CM | POA: Diagnosis not present

## 2022-09-07 DIAGNOSIS — N1831 Chronic kidney disease, stage 3a: Secondary | ICD-10-CM | POA: Diagnosis not present

## 2022-09-07 DIAGNOSIS — I4891 Unspecified atrial fibrillation: Secondary | ICD-10-CM | POA: Diagnosis not present

## 2022-09-07 DIAGNOSIS — I5032 Chronic diastolic (congestive) heart failure: Secondary | ICD-10-CM | POA: Diagnosis not present

## 2022-09-07 DIAGNOSIS — E1122 Type 2 diabetes mellitus with diabetic chronic kidney disease: Secondary | ICD-10-CM | POA: Diagnosis not present

## 2022-09-07 NOTE — Telephone Encounter (Signed)
Transition Care Management Follow-up Telephone Call Date of discharge and from where: [place] 8/20 How have you been since you were released from the hospital? Doing well is following up with provider  Any questions or concerns? Yes  Items Reviewed: Did the pt receive and understand the discharge instructions provided? Yes  Medications obtained and verified? No  Other? No  Any new allergies since your discharge? No  Dietary orders reviewed? Yes Do you have support at home? Yes    Follow up appointments reviewed: PCP Hospital f/u appt confirmed? Yes  Scheduled to see  on  @ . Specialist Hospital f/u appt confirmed? Yes  Scheduled to see  on  @ . Are transportation arrangements needed? No  If their condition worsens, is the pt aware to call PCP or go to the Emergency Dept.? Yes Was the patient provided with contact information for the PCP's office or ED? Yes Was to pt encouraged to call back with questions or concerns? Yes

## 2022-09-08 ENCOUNTER — Ambulatory Visit: Payer: Medicare HMO | Admitting: Cardiovascular Disease

## 2022-09-12 DIAGNOSIS — R3914 Feeling of incomplete bladder emptying: Secondary | ICD-10-CM | POA: Diagnosis not present

## 2022-09-12 DIAGNOSIS — N1832 Chronic kidney disease, stage 3b: Secondary | ICD-10-CM | POA: Diagnosis not present

## 2022-09-12 DIAGNOSIS — I5032 Chronic diastolic (congestive) heart failure: Secondary | ICD-10-CM | POA: Diagnosis not present

## 2022-09-12 DIAGNOSIS — E1129 Type 2 diabetes mellitus with other diabetic kidney complication: Secondary | ICD-10-CM | POA: Diagnosis not present

## 2022-09-14 ENCOUNTER — Ambulatory Visit (INDEPENDENT_AMBULATORY_CARE_PROVIDER_SITE_OTHER): Payer: Medicare HMO

## 2022-09-14 DIAGNOSIS — E782 Mixed hyperlipidemia: Secondary | ICD-10-CM

## 2022-09-14 DIAGNOSIS — R0602 Shortness of breath: Secondary | ICD-10-CM | POA: Diagnosis not present

## 2022-09-14 DIAGNOSIS — E1159 Type 2 diabetes mellitus with other circulatory complications: Secondary | ICD-10-CM

## 2022-09-14 DIAGNOSIS — I70213 Atherosclerosis of native arteries of extremities with intermittent claudication, bilateral legs: Secondary | ICD-10-CM

## 2022-09-14 DIAGNOSIS — I10 Essential (primary) hypertension: Secondary | ICD-10-CM

## 2022-09-14 DIAGNOSIS — I25118 Atherosclerotic heart disease of native coronary artery with other forms of angina pectoris: Secondary | ICD-10-CM

## 2022-09-14 MED ORDER — TECHNETIUM TC 99M SESTAMIBI GENERIC - CARDIOLITE
11.3000 | Freq: Once | INTRAVENOUS | Status: AC | PRN
  Administered 2022-09-14: 11.3 via INTRAVENOUS

## 2022-09-14 MED ORDER — TECHNETIUM TC 99M SESTAMIBI GENERIC - CARDIOLITE
33.9000 | Freq: Once | INTRAVENOUS | Status: AC | PRN
  Administered 2022-09-14: 33.9 via INTRAVENOUS

## 2022-09-15 DIAGNOSIS — E1122 Type 2 diabetes mellitus with diabetic chronic kidney disease: Secondary | ICD-10-CM | POA: Diagnosis not present

## 2022-09-15 DIAGNOSIS — I5032 Chronic diastolic (congestive) heart failure: Secondary | ICD-10-CM | POA: Diagnosis not present

## 2022-09-15 DIAGNOSIS — I2510 Atherosclerotic heart disease of native coronary artery without angina pectoris: Secondary | ICD-10-CM | POA: Diagnosis not present

## 2022-09-15 DIAGNOSIS — I4891 Unspecified atrial fibrillation: Secondary | ICD-10-CM | POA: Diagnosis not present

## 2022-09-15 DIAGNOSIS — I69398 Other sequelae of cerebral infarction: Secondary | ICD-10-CM | POA: Diagnosis not present

## 2022-09-15 DIAGNOSIS — N1831 Chronic kidney disease, stage 3a: Secondary | ICD-10-CM | POA: Diagnosis not present

## 2022-09-15 DIAGNOSIS — I69328 Other speech and language deficits following cerebral infarction: Secondary | ICD-10-CM | POA: Diagnosis not present

## 2022-09-15 DIAGNOSIS — R29898 Other symptoms and signs involving the musculoskeletal system: Secondary | ICD-10-CM | POA: Diagnosis not present

## 2022-09-15 DIAGNOSIS — I110 Hypertensive heart disease with heart failure: Secondary | ICD-10-CM | POA: Diagnosis not present

## 2022-09-18 ENCOUNTER — Ambulatory Visit: Payer: Medicare HMO | Admitting: Cardiovascular Disease

## 2022-09-18 ENCOUNTER — Encounter: Payer: Self-pay | Admitting: Cardiovascular Disease

## 2022-09-18 VITALS — BP 120/69 | HR 77 | Ht 78.0 in | Wt 234.0 lb

## 2022-09-18 DIAGNOSIS — I5032 Chronic diastolic (congestive) heart failure: Secondary | ICD-10-CM

## 2022-09-18 DIAGNOSIS — E782 Mixed hyperlipidemia: Secondary | ICD-10-CM | POA: Diagnosis not present

## 2022-09-18 DIAGNOSIS — I2585 Chronic coronary microvascular dysfunction: Secondary | ICD-10-CM | POA: Diagnosis not present

## 2022-09-18 DIAGNOSIS — I25118 Atherosclerotic heart disease of native coronary artery with other forms of angina pectoris: Secondary | ICD-10-CM

## 2022-09-18 DIAGNOSIS — I4891 Unspecified atrial fibrillation: Secondary | ICD-10-CM | POA: Diagnosis not present

## 2022-09-18 DIAGNOSIS — R600 Localized edema: Secondary | ICD-10-CM

## 2022-09-18 NOTE — Progress Notes (Signed)
Cardiology Office Note   Date:  09/18/2022   ID:  Dylan Robles., DOB [birth year], MRN [id number]  PCP:  Billie Lade, MD  Cardiologist:  Adrian Blackwater, MD      History of Present Illness: Dylan Robles. is a 82 y.o. male who presents for  Chief Complaint  Patient presents with   Follow-up    NST results    Feeling fine      Past Medical History:  Diagnosis Date   Acute lower UTI 09/20/2017   Bradycardia on ECG 07/31/2011   Coronary artery disease    hyperlipidemia   Decreased GFR 09/23/2019   Dehydration 09/21/2017   Diabetes mellitus    Diabetes mellitus without complication (HCC)    Hypercholesterolemia    Hypertension    Hypothyroidism    Left arm pain 01/29/2020   Paronychia of great toe of left foot 09/26/2018   Paronychia of great toe, left 09/26/2018   PERIPHERAL NEUROPATHY 01/13/2006   Qualifier: Diagnosis of  By: Jen Mow MD, Christine     Shortness of breath dyspnea    with exertion   Vasovagal syncope    Weakness 09/20/2017     Past Surgical History:  Procedure Laterality Date   APPENDECTOMY     CARDIAC CATHETERIZATION N/A 06/16/2014   Procedure: Left Heart Cath;  Surgeon: Laurier Nancy, MD;  Location: [facility] INVASIVE CV LAB;  Service: Cardiovascular;  Laterality: N/A;   CIRCUMCISION, NON-NEWBORN     HERNIA REPAIR     LOWER EXTREMITY ANGIOGRAPHY Left 12/24/2018   Procedure: LOWER EXTREMITY ANGIOGRAPHY;  Surgeon: Renford Dills, MD;  Location: [facility] INVASIVE CV LAB;  Service: Cardiovascular;  Laterality: Left;   THYROID SURGERY       Current Outpatient Medications  Medication Sig Dispense Refill   apixaban (ELIQUIS) 2.5 MG TABS tablet Take 1 tablet (2.5 mg total) by mouth 2 (two) times daily. 60 tablet 3   atorvastatin (LIPITOR) 80 MG tablet TAKE 1 TABLET AT BEDTIME. 90 tablet 1   cetirizine (ZYRTEC) 5 MG tablet Take 1 tablet (5 mg total) by mouth daily. 90 tablet 1   Continuous Blood Gluc Sensor (FREESTYLE LIBRE 2 SENSOR) MISC  Use to check glucose at least 4 times daily 6 each 3   ezetimibe (ZETIA) 10 MG tablet TAKE 1 TABLET EVERY DAY 90 tablet 1   Fish Oil-Vitamin D 1000-1000 MG-UNIT CAPS Take by mouth once. daily     furosemide (LASIX) 20 MG tablet Take 20 mg by mouth as needed.     gabapentin (NEURONTIN) 300 MG capsule Take 1 capsule (300 mg total) by mouth 3 (three) times daily. 270 capsule 1   Glucagon (GVOKE HYPOPEN 2-PACK) 1 MG/0.2ML SOAJ Inject 1 mg into the skin daily as needed. 0.4 mL 1   insulin aspart (NOVOLOG FLEXPEN) 100 UNIT/ML FlexPen Inject 10-16 Units into the skin 3 (three) times daily with meals. 30 mL 3   insulin degludec (TRESIBA FLEXTOUCH) 100 UNIT/ML FlexTouch Pen Inject 16 Units into the skin at bedtime. 15 mL 3   isosorbide mononitrate (IMDUR) 60 MG 24 hr tablet Take 1 tablet by mouth once daily 30 tablet 0   levothyroxine (SYNTHROID) 88 MCG tablet TAKE 1 TABLET EVERY DAY BEFORE BREAKFAST 90 tablet 3   metoprolol succinate (TOPROL-XL) 50 MG 24 hr tablet TAKE 1 TABLET EVERY DAY 90 tablet 3   spironolactone (ALDACTONE) 25 MG tablet Take 1 tablet (25 mg total) by mouth daily. 30 tablet 11  tamsulosin (FLOMAX) 0.4 MG CAPS capsule Take 1 capsule (0.4 mg total) by mouth at bedtime. 90 capsule 1   triamcinolone cream (KENALOG) 0.1 % Apply 1 Application topically 2 (two) times daily. (Patient not taking: Reported on 08/21/2022) 15 g 1   No current facility-administered medications for this visit.    Allergies:   Plavix [clopidogrel]    Social History:   reports that he has quit smoking. He has never used smokeless tobacco. He reports that he does not drink alcohol and does not use drugs.   Family History:  family history includes Cancer in his mother; Diabetes in his brother and father; Hypertension in his father.    ROS:     Review of Systems  Constitutional: Negative.   HENT: Negative.    Eyes: Negative.   Respiratory: Negative.    Gastrointestinal: Negative.   Genitourinary: Negative.    Musculoskeletal: Negative.   Skin: Negative.   Neurological: Negative.   Endo/Heme/Allergies: Negative.   Psychiatric/Behavioral: Negative.    All other systems reviewed and are negative.     All other systems are reviewed and negative.    PHYSICAL EXAM: VS:  BP 120/69   Pulse 77   Ht 6\' 6"  (1.981 m)   Wt 234 lb (106.1 kg)   SpO2 98%   BMI 27.04 kg/m  , BMI Body mass index is 27.04 kg/m. Last weight:  Wt Readings from Last 3 Encounters:  09/18/22 234 lb (106.1 kg)  09/01/22 234 lb 9.6 oz (106.4 kg)  08/30/22 233 lb 11 oz (106 kg)     Physical Exam Vitals reviewed.  Constitutional:      Appearance: Normal appearance. He is normal weight.  HENT:     Head: Normocephalic.     Nose: Nose normal.     Mouth/Throat:     Mouth: Mucous membranes are moist.  Eyes:     Pupils: Pupils are equal, round, and reactive to light.  Cardiovascular:     Rate and Rhythm: Normal rate and regular rhythm.     Pulses: Normal pulses.     Heart sounds: Normal heart sounds.  Pulmonary:     Effort: Pulmonary effort is normal.  Abdominal:     General: Abdomen is flat. Bowel sounds are normal.  Musculoskeletal:        General: Normal range of motion.     Cervical back: Normal range of motion.  Skin:    General: Skin is warm.  Neurological:     General: No focal deficit present.     Mental Status: He is alert.  Psychiatric:        Mood and Affect: Mood normal.       EKG:   Recent Labs: 08/20/2022: ALT 28; B Natriuretic Peptide 439.8; TSH 1.510 08/30/2022: BUN 34; Creatinine, Ser 1.71; Hemoglobin 11.4; Platelets 259; Potassium 4.9; Sodium 134    Lipid Panel    Component Value Date/Time   CHOL 126 08/21/2022 0502   CHOL 125 05/10/2022 0917   TRIG 74 08/21/2022 0502   HDL 34 (L) 08/21/2022 0502   HDL 46 05/10/2022 0917   CHOLHDL 3.7 08/21/2022 0502   VLDL 15 08/21/2022 0502   LDLCALC 77 08/21/2022 0502   LDLCALC 65 05/10/2022 0917   LDLCALC 73 11/03/2020 1032       Other studies Reviewed: Additional studies/ records that were reviewed today include:  Review of the above records demonstrates:       No data to display  ASSESSMENT AND PLAN:    ICD-10-CM   1. Chronic coronary microvascular dysfunction  I25.85     2. Chronic diastolic CHF (congestive heart failure) (HCC)  I50.32     3. Coronary artery disease of native artery of native heart with stable angina pectoris (HCC)  I25.118    Mild inferior reversible defect on stress test, but feels fine, treat medicallly.    4. New onset atrial fibrillation (HCC)  I48.91     5. Bilateral lower extremity edema  R60.0     6. Mixed hyperlipidemia  E78.2        Problem List Items Addressed This Visit       Cardiovascular and Mediastinum   Coronary artery disease of native artery of native heart with stable angina pectoris (HCC)   Chronic diastolic CHF (congestive heart failure) (HCC)   New onset atrial fibrillation (HCC)   CAD (coronary artery disease) - Primary     Other   Hyperlipidemia   Bilateral lower extremity edema       Disposition:   Return in about 3 months (around 12/18/2022).    Total time spent: 35 minutes  Signed,  Adrian Blackwater, MD  09/18/2022 10:17 AM    [facility]

## 2022-09-19 DIAGNOSIS — I69328 Other speech and language deficits following cerebral infarction: Secondary | ICD-10-CM | POA: Diagnosis not present

## 2022-09-19 DIAGNOSIS — E1122 Type 2 diabetes mellitus with diabetic chronic kidney disease: Secondary | ICD-10-CM | POA: Diagnosis not present

## 2022-09-19 DIAGNOSIS — I2510 Atherosclerotic heart disease of native coronary artery without angina pectoris: Secondary | ICD-10-CM | POA: Diagnosis not present

## 2022-09-19 DIAGNOSIS — I5032 Chronic diastolic (congestive) heart failure: Secondary | ICD-10-CM | POA: Diagnosis not present

## 2022-09-19 DIAGNOSIS — N1831 Chronic kidney disease, stage 3a: Secondary | ICD-10-CM | POA: Diagnosis not present

## 2022-09-19 DIAGNOSIS — I110 Hypertensive heart disease with heart failure: Secondary | ICD-10-CM | POA: Diagnosis not present

## 2022-09-19 DIAGNOSIS — I4891 Unspecified atrial fibrillation: Secondary | ICD-10-CM | POA: Diagnosis not present

## 2022-09-19 DIAGNOSIS — I69398 Other sequelae of cerebral infarction: Secondary | ICD-10-CM | POA: Diagnosis not present

## 2022-09-19 DIAGNOSIS — R29898 Other symptoms and signs involving the musculoskeletal system: Secondary | ICD-10-CM | POA: Diagnosis not present

## 2022-09-25 ENCOUNTER — Ambulatory Visit: Payer: Medicare HMO | Admitting: Internal Medicine

## 2022-09-28 DIAGNOSIS — N1831 Chronic kidney disease, stage 3a: Secondary | ICD-10-CM | POA: Diagnosis not present

## 2022-09-28 DIAGNOSIS — I4891 Unspecified atrial fibrillation: Secondary | ICD-10-CM | POA: Diagnosis not present

## 2022-09-28 DIAGNOSIS — I69328 Other speech and language deficits following cerebral infarction: Secondary | ICD-10-CM | POA: Diagnosis not present

## 2022-09-28 DIAGNOSIS — I2510 Atherosclerotic heart disease of native coronary artery without angina pectoris: Secondary | ICD-10-CM | POA: Diagnosis not present

## 2022-09-28 DIAGNOSIS — E1122 Type 2 diabetes mellitus with diabetic chronic kidney disease: Secondary | ICD-10-CM | POA: Diagnosis not present

## 2022-09-28 DIAGNOSIS — I69398 Other sequelae of cerebral infarction: Secondary | ICD-10-CM | POA: Diagnosis not present

## 2022-09-28 DIAGNOSIS — R29898 Other symptoms and signs involving the musculoskeletal system: Secondary | ICD-10-CM | POA: Diagnosis not present

## 2022-09-28 DIAGNOSIS — I110 Hypertensive heart disease with heart failure: Secondary | ICD-10-CM | POA: Diagnosis not present

## 2022-09-28 DIAGNOSIS — I5032 Chronic diastolic (congestive) heart failure: Secondary | ICD-10-CM | POA: Diagnosis not present

## 2022-10-10 ENCOUNTER — Ambulatory Visit: Payer: Medicare HMO | Admitting: Podiatry

## 2022-10-10 DIAGNOSIS — T148XXA Other injury of unspecified body region, initial encounter: Secondary | ICD-10-CM | POA: Diagnosis not present

## 2022-10-10 NOTE — Progress Notes (Signed)
Subjective:  Patient ID: [name]., male    DOB: 1940/07/27,  MRN: [id number]  Chief Complaint  Patient presents with   Toe Pain    Right foot 2nd toe has a black spot on it no pain or discomfort     82 y.o. male presents with the above complaint.  Patient presents with right second digit dried blood blister.  Patient states that it does not cause him pain or discomfort.  He just wanted get eval make sure is not cancer.  He has not seen anyone else prior to seeing me.  This is on the second toe.  Pain scale is 0 out of 10.  He is a diabetic.   Review of Systems: Negative except as noted in the HPI. Denies N/V/F/Ch.  Past Medical History:  Diagnosis Date   Acute lower UTI 09/20/2017   Bradycardia on ECG 07/31/2011   Coronary artery disease    hyperlipidemia   Decreased GFR 09/23/2019   Dehydration 09/21/2017   Diabetes mellitus    Diabetes mellitus without complication (HCC)    Hypercholesterolemia    Hypertension    Hypothyroidism    Left arm pain 01/29/2020   Paronychia of great toe of left foot 09/26/2018   Paronychia of great toe, left 09/26/2018   PERIPHERAL NEUROPATHY 01/13/2006   Qualifier: Diagnosis of  By: Jen Mow MD, Christine     Shortness of breath dyspnea    with exertion   Vasovagal syncope    Weakness 09/20/2017    Current Outpatient Medications:    apixaban (ELIQUIS) 2.5 MG TABS tablet, Take 1 tablet (2.5 mg total) by mouth 2 (two) times daily., Disp: 60 tablet, Rfl: 3   atorvastatin (LIPITOR) 80 MG tablet, TAKE 1 TABLET AT BEDTIME., Disp: 90 tablet, Rfl: 1   cetirizine (ZYRTEC) 5 MG tablet, Take 1 tablet (5 mg total) by mouth daily., Disp: 90 tablet, Rfl: 1   Continuous Blood Gluc Sensor (FREESTYLE LIBRE 2 SENSOR) MISC, Use to check glucose at least 4 times daily, Disp: 6 each, Rfl: 3   ezetimibe (ZETIA) 10 MG tablet, TAKE 1 TABLET EVERY DAY, Disp: 90 tablet, Rfl: 1   Fish Oil-Vitamin D 1000-1000 MG-UNIT CAPS, Take by mouth once. daily, Disp: , Rfl:     furosemide (LASIX) 20 MG tablet, Take 20 mg by mouth as needed., Disp: , Rfl:    gabapentin (NEURONTIN) 300 MG capsule, Take 1 capsule (300 mg total) by mouth 3 (three) times daily., Disp: 270 capsule, Rfl: 1   Glucagon (GVOKE HYPOPEN 2-PACK) 1 MG/0.2ML SOAJ, Inject 1 mg into the skin daily as needed., Disp: 0.4 mL, Rfl: 1   insulin aspart (NOVOLOG FLEXPEN) 100 UNIT/ML FlexPen, Inject 10-16 Units into the skin 3 (three) times daily with meals., Disp: 30 mL, Rfl: 3   insulin degludec (TRESIBA FLEXTOUCH) 100 UNIT/ML FlexTouch Pen, Inject 16 Units into the skin at bedtime., Disp: 15 mL, Rfl: 3   isosorbide mononitrate (IMDUR) 60 MG 24 hr tablet, Take 1 tablet by mouth once daily, Disp: 30 tablet, Rfl: 0   levothyroxine (SYNTHROID) 88 MCG tablet, TAKE 1 TABLET EVERY DAY BEFORE BREAKFAST, Disp: 90 tablet, Rfl: 3   metoprolol succinate (TOPROL-XL) 50 MG 24 hr tablet, TAKE 1 TABLET EVERY DAY, Disp: 90 tablet, Rfl: 3   spironolactone (ALDACTONE) 25 MG tablet, Take 1 tablet (25 mg total) by mouth daily., Disp: 30 tablet, Rfl: 11   tamsulosin (FLOMAX) 0.4 MG CAPS capsule, Take 1 capsule (0.4 mg total) by mouth  at bedtime., Disp: 90 capsule, Rfl: 1   triamcinolone cream (KENALOG) 0.1 %, Apply 1 Application topically 2 (two) times daily. (Patient not taking: Reported on 08/21/2022), Disp: 15 g, Rfl: 1  Social History   Tobacco Use  Smoking Status Former  Smokeless Tobacco Never    Allergies  Allergen Reactions   Plavix [Clopidogrel] Hives   Objective:  There were no vitals filed for this visit. There is no height or weight on file to calculate BMI. Constitutional Well developed. Well nourished.  Vascular Dorsalis pedis pulses palpable bilaterally. Posterior tibial pulses palpable bilaterally. Capillary refill normal to all digits.  No cyanosis or clubbing noted. Pedal hair growth normal.  Neurologic Normal speech. Oriented to person, place, and time. Epicritic sensation to light touch grossly  present bilaterally.  Dermatologic Right second digit with dried blood blister underlying skin completely reepithelialized.  The blister was removed with a chisel blade and handle.  Completely reepithelialized skin noted no wounds or lesion noted no infection noted  Orthopedic: Normal joint ROM without pain or crepitus bilaterally. No visible deformities. No bony tenderness.   Radiographs: None Assessment:  No diagnosis found. Plan:  Patient was evaluated and treated and all questions answered.  Right second digit dried blood blister now healed -All questions and concerns were discussed with the patient in extensive detail -Using chisel blade handle the dried blister was deroofed.  Underlying skin has completely epithelialized.  No signs of infection noted.  At this time I discussed shoe gear modification and reduction of friction blister.  He states understanding if any foot and ankle issues on future he will come back and see me  No follow-ups on file.

## 2022-10-11 DIAGNOSIS — N3289 Other specified disorders of bladder: Secondary | ICD-10-CM | POA: Insufficient documentation

## 2022-10-11 NOTE — Progress Notes (Deleted)
Name: Dylan Robles. DOB: 1940-01-25 MRN: [id number]  History of Present Illness: Dylan Robles is a 82 y.o. male who presents today as a new patient at [facility]. All available relevant medical records have been reviewed.  - GU History: 1. BPH with BOO and LUTS (including incomplete bladder emptying).  - Previously saw Dr. Liliane Shi in 2020. - LUTS likely exacerbated by diuretic use for CHF and his diabetes. 2. CKD. - 09/05/2022: GFR 34, creatinine 1.94 - He is established with Nephrology at [facility] (Dr. Wolfgang Phoenix).  Relevant imaging: Renal US on 05/17/2022 showed: 1. The kidneys are unremarkable. 2. Mild trabeculation of the bladder wall. 3. Contour abnormality at the base of the bladder is consistent with mass effect on the bladder base by the prostate. 4. Large 268 cc postvoid residual.  Referred by PCP due to incomplete bladder emptying and concern for worsening CKD.  Today: He reports *** urinary stream. He {Actions; denies-reports:120008} urinary hesitancy, urgency, frequency, incontinence, dysuria, gross hematuria, straining to void, or sensations of incomplete emptying.  He {Actions; denies-reports:120008} flank pain or abdominal pain.  ***cysto & TRUS for prostate sizing ***will likely need subsequent urodynamic testing to evaluate for possible neurogenic bladder due to T2DM with polyneuropathy. ***Or can consider CIC (if he can do it) or chronic indwelling Foley at this time  Fall Screening: Do you usually have a device to assist in your mobility? {yes/no:20286} ***cane / ***walker / ***wheelchair  Medications: Current Outpatient Medications  Medication Sig Dispense Refill   apixaban (ELIQUIS) 2.5 MG TABS tablet Take 1 tablet (2.5 mg total) by mouth 2 (two) times daily. 60 tablet 3   atorvastatin (LIPITOR) 80 MG tablet TAKE 1 TABLET AT BEDTIME. 90 tablet 1   cetirizine (ZYRTEC) 5 MG tablet Take 1 tablet (5 mg total) by mouth  daily. 90 tablet 1   Continuous Blood Gluc Sensor (FREESTYLE LIBRE 2 SENSOR) MISC Use to check glucose at least 4 times daily 6 each 3   ezetimibe (ZETIA) 10 MG tablet TAKE 1 TABLET EVERY DAY 90 tablet 1   Fish Oil-Vitamin D 1000-1000 MG-UNIT CAPS Take by mouth once. daily     furosemide (LASIX) 20 MG tablet Take 20 mg by mouth as needed.     gabapentin (NEURONTIN) 300 MG capsule Take 1 capsule (300 mg total) by mouth 3 (three) times daily. 270 capsule 1   Glucagon (GVOKE HYPOPEN 2-PACK) 1 MG/0.2ML SOAJ Inject 1 mg into the skin daily as needed. 0.4 mL 1   insulin aspart (NOVOLOG FLEXPEN) 100 UNIT/ML FlexPen Inject 10-16 Units into the skin 3 (three) times daily with meals. 30 mL 3   insulin degludec (TRESIBA FLEXTOUCH) 100 UNIT/ML FlexTouch Pen Inject 16 Units into the skin at bedtime. 15 mL 3   isosorbide mononitrate (IMDUR) 60 MG 24 hr tablet Take 1 tablet by mouth once daily 30 tablet 0   levothyroxine (SYNTHROID) 88 MCG tablet TAKE 1 TABLET EVERY DAY BEFORE BREAKFAST 90 tablet 3   metoprolol succinate (TOPROL-XL) 50 MG 24 hr tablet TAKE 1 TABLET EVERY DAY 90 tablet 3   spironolactone (ALDACTONE) 25 MG tablet Take 1 tablet (25 mg total) by mouth daily. 30 tablet 11   tamsulosin (FLOMAX) 0.4 MG CAPS capsule Take 1 capsule (0.4 mg total) by mouth at bedtime. 90 capsule 1   triamcinolone cream (KENALOG) 0.1 % Apply 1 Application topically 2 (two) times daily. (Patient not taking: Reported on 08/21/2022) 15 g 1   No current  facility-administered medications for this visit.    Allergies: Allergies  Allergen Reactions   Plavix [Clopidogrel] Hives    Past Medical History:  Diagnosis Date   Acute lower UTI 09/20/2017   Bradycardia on ECG 07/31/2011   Coronary artery disease    hyperlipidemia   Decreased GFR 09/23/2019   Dehydration 09/21/2017   Diabetes mellitus    Diabetes mellitus without complication (HCC)    Hypercholesterolemia    Hypertension    Hypothyroidism    Left arm pain  01/29/2020   Paronychia of great toe of left foot 09/26/2018   Paronychia of great toe, left 09/26/2018   PERIPHERAL NEUROPATHY 01/13/2006   Qualifier: Diagnosis of  By: Jen Mow MD, Christine     Shortness of breath dyspnea    with exertion   Vasovagal syncope    Weakness 09/20/2017   Past Surgical History:  Procedure Laterality Date   APPENDECTOMY     CARDIAC CATHETERIZATION N/A 06/16/2014   Procedure: Left Heart Cath;  Surgeon: Laurier Nancy, MD;  Location: [facility] INVASIVE CV LAB;  Service: Cardiovascular;  Laterality: N/A;   CIRCUMCISION, NON-NEWBORN     HERNIA REPAIR     LOWER EXTREMITY ANGIOGRAPHY Left 12/24/2018   Procedure: LOWER EXTREMITY ANGIOGRAPHY;  Surgeon: Renford Dills, MD;  Location: [facility] INVASIVE CV LAB;  Service: Cardiovascular;  Laterality: Left;   THYROID SURGERY     Family History  Problem Relation Age of Onset   Diabetes Father    Hypertension Father    Cancer Mother    Diabetes Brother    Social History   Socioeconomic History   Marital status: Married    Spouse name: Not on file   Number of children: Not on file   Years of education: Not on file   Highest education level: Not on file  Occupational History   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  Tobacco Use   Smoking status: Former   Smokeless tobacco: Never  Advertising account planner   Vaping status: Never Used  Substance and Sexual Activity   Alcohol use: No   Drug use: No   Sexual activity: Not on file  Other Topics Concern   Not on file  Social History Narrative   ** Merged History Encounter **       Social Determinants of Health   Financial Resource Strain: Low Risk  (03/29/2021)   Overall Financial Resource Strain (CARDIA)    Difficulty of Paying Living Expenses: Not very hard  Food Insecurity: No Food Insecurity (08/25/2022)   Hunger Vital Sign    Worried About Running Out of Food in the Last Year: Never true    Ran Out of Food in the Last Year: Never true  Transportation Needs: No  Transportation Needs (08/25/2022)   PRAPARE - Administrator, Civil Service (Medical): No    Lack of Transportation (Non-Medical): No  Physical Activity: Insufficiently Active (03/29/2021)   Exercise Vital Sign    Days of Exercise per Week: 7 days    Minutes of Exercise per Session: 20 min  Stress: No Stress Concern Present (03/29/2021)   [employer] of Occupational Health - Occupational Stress Questionnaire    Feeling of Stress : Not at all  Social Connections: Socially Integrated (03/29/2021)   Social Connection and Isolation Panel [NHANES]    Frequency of Communication with Friends and Family: More than three times a week    Frequency of Social Gatherings with Friends and Family: More than three times a  week    Attends Religious Services: More than 4 times per year    Active Member of Clubs or Organizations: Yes    Attends Banker Meetings: 1 to 4 times per year    Marital Status: Married  Catering manager Violence: Not At Risk (08/20/2022)   Humiliation, Afraid, Rape, and Kick questionnaire    Fear of Current or Ex-Partner: No    Emotionally Abused: No    Physically Abused: No    Sexually Abused: No    SUBJECTIVE  Review of Systems*** Constitutional: Patient denies any unintentional weight loss or change in strength lntegumentary: Patient denies any rashes or pruritus Eyes: Patient denies ***dry eyes ENT: Patient ***denies dry mouth Cardiovascular: Patient denies chest pain or syncope Respiratory: Patient denies shortness of breath Gastrointestinal: Patient ***denies nausea, vomiting, constipation, or diarrhea Musculoskeletal: Patient denies muscle cramps or weakness Neurologic: Patient denies convulsions or seizures Allergic/Immunologic: Patient denies recent allergic reaction(s) Hematologic/Lymphatic: Patient denies bleeding tendencies Endocrine: Patient denies heat/cold intolerance  GU: As per HPI.  OBJECTIVE There were no vitals filed for  this visit. There is no height or weight on file to calculate BMI.  Physical Examination*** Constitutional: No obvious distress; patient is non-toxic appearing  Cardiovascular: No visible lower extremity edema.  Respiratory: The patient does not have audible wheezing/stridor; respirations do not appear labored  Gastrointestinal: Abdomen non-distended Musculoskeletal: Normal ROM of UEs  Skin: No obvious rashes/open sores  Neurologic: CN 2-12 grossly intact Psychiatric: Answered questions appropriately with normal affect  Hematologic/Lymphatic/Immunologic: No obvious bruises or sites of spontaneous bleeding  UA: ***negative *** WBC/hpf, *** RBC/hpf, bacteria (***) PVR: *** ml  ASSESSMENT No diagnosis found. ***  Will plan for follow up in *** months or sooner if needed. Pt verbalized understanding and agreement. All questions were answered.  PLAN Advised the following: *** ***No follow-ups on file.  No orders of the defined types were placed in this encounter.   It has been explained that the patient is to follow regularly with their PCP in addition to all other providers involved in their care and to follow instructions provided by these respective offices. Patient advised to contact urology clinic if any urologic-pertaining questions, concerns, new symptoms or problems arise in the interim period.  There are no Patient Instructions on file for this visit.  Electronically signed by:  Donnita Falls, MSN, FNP-C, CUNP 10/11/2022 12:12 PM

## 2022-10-13 DIAGNOSIS — E1122 Type 2 diabetes mellitus with diabetic chronic kidney disease: Secondary | ICD-10-CM | POA: Diagnosis not present

## 2022-10-13 DIAGNOSIS — R29898 Other symptoms and signs involving the musculoskeletal system: Secondary | ICD-10-CM | POA: Diagnosis not present

## 2022-10-13 DIAGNOSIS — I2510 Atherosclerotic heart disease of native coronary artery without angina pectoris: Secondary | ICD-10-CM | POA: Diagnosis not present

## 2022-10-13 DIAGNOSIS — I5032 Chronic diastolic (congestive) heart failure: Secondary | ICD-10-CM | POA: Diagnosis not present

## 2022-10-13 DIAGNOSIS — I69398 Other sequelae of cerebral infarction: Secondary | ICD-10-CM | POA: Diagnosis not present

## 2022-10-13 DIAGNOSIS — I4891 Unspecified atrial fibrillation: Secondary | ICD-10-CM | POA: Diagnosis not present

## 2022-10-13 DIAGNOSIS — N1831 Chronic kidney disease, stage 3a: Secondary | ICD-10-CM | POA: Diagnosis not present

## 2022-10-13 DIAGNOSIS — I110 Hypertensive heart disease with heart failure: Secondary | ICD-10-CM | POA: Diagnosis not present

## 2022-10-13 DIAGNOSIS — I69328 Other speech and language deficits following cerebral infarction: Secondary | ICD-10-CM | POA: Diagnosis not present

## 2022-10-17 ENCOUNTER — Ambulatory Visit: Payer: Medicare HMO | Admitting: Urology

## 2022-10-17 DIAGNOSIS — E0842 Diabetes mellitus due to underlying condition with diabetic polyneuropathy: Secondary | ICD-10-CM

## 2022-10-17 DIAGNOSIS — E1122 Type 2 diabetes mellitus with diabetic chronic kidney disease: Secondary | ICD-10-CM

## 2022-10-17 DIAGNOSIS — N401 Enlarged prostate with lower urinary tract symptoms: Secondary | ICD-10-CM

## 2022-10-17 DIAGNOSIS — N3289 Other specified disorders of bladder: Secondary | ICD-10-CM

## 2022-10-17 DIAGNOSIS — N1831 Chronic kidney disease, stage 3a: Secondary | ICD-10-CM

## 2022-10-19 NOTE — Progress Notes (Signed)
Name: Dylan Robles. DOB: Jun 06, 1940 MRN: [id number]  History of Present Illness: [name] is a 82 y.o. male who presents today as a new patient at [facility] . All available relevant medical records have been reviewed. He is accompanied by his wife [name]. - GU History: 1. BPH with BOO and LUTS (including incomplete bladder emptying).  - Previously saw Dr. Liliane Shi in 2020. - LUTS likely exacerbated by diuretic use for CHF and his diabetes. - Taking Flomax 0.4 mg daily.  Referred by PCP due to incomplete bladder emptying and concern for worsening CKD. > He is established with Nephrology at [facility] (Dr. Wolfgang Phoenix).  > Renal US on 05/17/2022 showed: 1. The kidneys are unremarkable. 2. Mild trabeculation of the bladder wall. 3. Contour abnormality at the base of the bladder is consistent with mass effect on the bladder base by the prostate. 4. Large 268 cc postvoid residual.  > 09/05/2022: GFR 34, creatinine 1.94  Today: He reports weak urinary stream, occasional urinary urgency, rare urge incontinence. Denies sensations of incomplete emptying but sometimes has to return to the bathroom to urinate more fairly soon after going.   He had a stroke in August 2024; denies any significant change in his urinary symptoms following the stroke.  He denies urinary hesitancy, frequency, incontinence without sensory awareness, nocturnal enursesis, dysuria, gross hematuria, straining to void.  He denies flank pain or abdominal pain. He denies history of kidney stones.  He denies history of recent or recurrent UTI.   Fall Screening: Do you usually have a device to assist in your mobility? Yes - walker   Medications: Current Outpatient Medications  Medication Sig Dispense Refill   apixaban (ELIQUIS) 2.5 MG TABS tablet Take 1 tablet (2.5 mg total) by mouth 2 (two) times daily. 60 tablet 3   atorvastatin (LIPITOR) 80 MG tablet TAKE 1 TABLET AT BEDTIME.  90 tablet 1   cetirizine (ZYRTEC) 5 MG tablet Take 1 tablet (5 mg total) by mouth daily. 90 tablet 1   Continuous Blood Gluc Sensor (FREESTYLE LIBRE 2 SENSOR) MISC Use to check glucose at least 4 times daily 6 each 3   ezetimibe (ZETIA) 10 MG tablet TAKE 1 TABLET EVERY DAY 90 tablet 1   Fish Oil-Vitamin D 1000-1000 MG-UNIT CAPS Take by mouth once. daily     furosemide (LASIX) 20 MG tablet Take 20 mg by mouth as needed.     gabapentin (NEURONTIN) 300 MG capsule Take 1 capsule (300 mg total) by mouth 3 (three) times daily. 270 capsule 1   Glucagon (GVOKE HYPOPEN 2-PACK) 1 MG/0.2ML SOAJ Inject 1 mg into the skin daily as needed. 0.4 mL 1   insulin aspart (NOVOLOG FLEXPEN) 100 UNIT/ML FlexPen Inject 10-16 Units into the skin 3 (three) times daily with meals. 30 mL 3   insulin degludec (TRESIBA FLEXTOUCH) 100 UNIT/ML FlexTouch Pen Inject 20 Units into the skin at bedtime. 18 mL 3   isosorbide mononitrate (IMDUR) 60 MG 24 hr tablet Take 1 tablet by mouth once daily 30 tablet 0   levothyroxine (SYNTHROID) 88 MCG tablet TAKE 1 TABLET EVERY DAY BEFORE BREAKFAST 90 tablet 3   metoprolol succinate (TOPROL-XL) 50 MG 24 hr tablet TAKE 1 TABLET EVERY DAY 90 tablet 3   spironolactone (ALDACTONE) 25 MG tablet Take 1 tablet (25 mg total) by mouth daily. 30 tablet 11   triamcinolone cream (KENALOG) 0.1 % Apply 1 Application topically 2 (two) times daily. 15 g 1   tamsulosin (  FLOMAX) 0.4 MG CAPS capsule Take 1 capsule (0.4 mg total) by mouth in the morning and at bedtime. 180 capsule 3   No current facility-administered medications for this visit.    Allergies: Allergies  Allergen Reactions   Plavix [Clopidogrel] Hives    Past Medical History:  Diagnosis Date   Acute lower UTI 09/20/2017   Bradycardia on ECG 07/31/2011   Coronary artery disease    hyperlipidemia   Decreased GFR 09/23/2019   Dehydration 09/21/2017   Diabetes mellitus    Diabetes mellitus without complication (HCC)    Hypercholesterolemia     Hypertension    Hypothyroidism    Left arm pain 01/29/2020   Paronychia of great toe of left foot 09/26/2018   Paronychia of great toe, left 09/26/2018   PERIPHERAL NEUROPATHY 01/13/2006   Qualifier: Diagnosis of  By: Jen Mow MD, Christine     Shortness of breath dyspnea    with exertion   Vasovagal syncope    Weakness 09/20/2017   Past Surgical History:  Procedure Laterality Date   APPENDECTOMY     CARDIAC CATHETERIZATION N/A 06/16/2014   Procedure: Left Heart Cath;  Surgeon: Laurier Nancy, MD;  Location: [facility] INVASIVE CV LAB;  Service: Cardiovascular;  Laterality: N/A;   CIRCUMCISION, NON-NEWBORN     HERNIA REPAIR     LOWER EXTREMITY ANGIOGRAPHY Left 12/24/2018   Procedure: LOWER EXTREMITY ANGIOGRAPHY;  Surgeon: Renford Dills, MD;  Location: [facility] INVASIVE CV LAB;  Service: Cardiovascular;  Laterality: Left;   THYROID SURGERY     Family History  Problem Relation Age of Onset   Diabetes Father    Hypertension Father    Cancer Mother    Diabetes Brother    Social History   Socioeconomic History   Marital status: Married    Spouse name: Not on file   Number of children: Not on file   Years of education: Not on file   Highest education level: Not on file  Occupational History   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  Tobacco Use   Smoking status: Former   Smokeless tobacco: Never  Advertising account planner   Vaping status: Never Used  Substance and Sexual Activity   Alcohol use: No   Drug use: No   Sexual activity: Not on file  Other Topics Concern   Not on file  Social History Narrative   ** Merged History Encounter **       Social Determinants of Health   Financial Resource Strain: Low Risk  (03/29/2021)   Overall Financial Resource Strain (CARDIA)    Difficulty of Paying Living Expenses: Not very hard  Food Insecurity: No Food Insecurity (08/25/2022)   Hunger Vital Sign    Worried About Running Out of Food in the Last Year: Never true    Ran Out of Food in the Last  Year: Never true  Transportation Needs: No Transportation Needs (08/25/2022)   PRAPARE - Administrator, Civil Service (Medical): No    Lack of Transportation (Non-Medical): No  Physical Activity: Insufficiently Active (03/29/2021)   Exercise Vital Sign    Days of Exercise per Week: 7 days    Minutes of Exercise per Session: 20 min  Stress: No Stress Concern Present (03/29/2021)   [employer] of Occupational Health - Occupational Stress Questionnaire    Feeling of Stress : Not at all  Social Connections: Socially Integrated (03/29/2021)   Social Connection and Isolation Panel [NHANES]    Frequency of Communication  with Friends and Family: More than three times a week    Frequency of Social Gatherings with Friends and Family: More than three times a week    Attends Religious Services: More than 4 times per year    Active Member of [employer] or Organizations: Yes    Attends Banker Meetings: 1 to 4 times per year    Marital Status: Married  Catering manager Violence: Not At Risk (08/20/2022)   Humiliation, Afraid, Rape, and Kick questionnaire    Fear of Current or Ex-Partner: No    Emotionally Abused: No    Physically Abused: No    Sexually Abused: No    SUBJECTIVE  Review of Systems Constitutional: Patient denies any unintentional weight loss or change in strength lntegumentary: Patient denies any rashes or pruritus Cardiovascular: Patient denies chest pain or syncope Respiratory: Patient denies shortness of breath Gastrointestinal: Patient denies nausea, vomiting, constipation, or diarrhea Musculoskeletal: Patient denies muscle cramps or weakness Neurologic: Patient denies convulsions or seizures Allergic/Immunologic: Patient denies recent allergic reaction(s) Hematologic/Lymphatic: Patient denies bleeding tendencies Endocrine: Patient denies heat/cold intolerance  GU: As per HPI.  OBJECTIVE Vitals:   10/27/22 1045  BP: (!) 156/74  Pulse: 74   Temp: 97.9 F (36.6 C)   Body mass index is 26.93 kg/m.  Physical Examination Constitutional: No obvious distress; patient is non-toxic appearing  Cardiovascular: No visible lower extremity edema.  Respiratory: The patient does not have audible wheezing/stridor; respirations do not appear labored  Gastrointestinal: Abdomen non-distended Musculoskeletal: Normal ROM of UEs  Skin: No obvious rashes/open sores  Neurologic: CN 2-12 grossly intact Psychiatric: Answered questions appropriately with normal affect  Hematologic/Lymphatic/Immunologic: No obvious bruises or sites of spontaneous bleeding  UA: negative  PVR: 345 ml  ASSESSMENT Benign prostatic hyperplasia with incomplete bladder emptying - Plan: Urinalysis, Routine w reflex microscopic, PR COMPLEX UROFLOMETRY, BLADDER SCAN AMB NON-IMAGING, tamsulosin (FLOMAX) 0.4 MG CAPS capsule  Bladder trabeculation - Plan: tamsulosin (FLOMAX) 0.4 MG CAPS capsule  Cerebrovascular accident (CVA), unspecified mechanism (HCC) - Plan: tamsulosin (FLOMAX) 0.4 MG CAPS capsule  History of BPH - Plan: tamsulosin (FLOMAX) 0.4 MG CAPS capsule  Urinary retention - Plan: tamsulosin (FLOMAX) 0.4 MG CAPS capsule  Bladder outflow obstruction - Plan: tamsulosin (FLOMAX) 0.4 MG CAPS capsule  We discussed finding of acute urinary retention. Possible etiologies may include temporary detrusor areflexia, neurogenic bladder, BPH/BOO, constipation, anticholinergic medication use, high-tone pelvic floor dysfunction, voiding dyssynergia. Suspect BPH / BOO as primary cause for his incomplete bladder emptying since it was present prior to his stroke in August 2024.  We discussed risks related to urinary retention / incomplete bladder emptying including but not limited to: bladder discomfort I pain, overflow urinary incontinence (which can result in skin breakdown / wounds), UTls, pyelonephritis, urosepsis, kidney damage/ failure, decreased kidney function requiring  dialysis.  For management of acute urinary retention we discussed options including indwelling Foley catheter or clean intermittent catheterization (CIC). Patient elected to proceed with indwelling catheter.   Cystoscopy advised for further evaluation to assess for outlet obstruction, bladder abnormalities, stones, clots, etc. And for bladder outlet procedure surgical consultation.  Advised to increased Flomax 0.4 mg to 2x/day for BPH; refills sent.  Pt and wife verbalized understanding and agreement. All questions were answered.  PLAN Advised the following: Foley catheter placed. Take Flomax 0.4 mg 2x/day. Requesting prior PSA records from PCP. Return for 1st available cystoscopy with any urology MD.  Orders Placed This Encounter  Procedures   Urinalysis, Routine w reflex  microscopic   PR COMPLEX UROFLOMETRY   BLADDER SCAN AMB NON-IMAGING    It has been explained that the patient is to follow regularly with their PCP in addition to all other providers involved in their care and to follow instructions provided by these respective offices. Patient advised to contact urology clinic if any urologic-pertaining questions, concerns, new symptoms or problems arise in the interim period.  There are no Patient Instructions on file for this visit.  Electronically signed by:  Donnita Falls, MSN, FNP-C, CUNP 10/27/2022 12:29 PM

## 2022-10-20 ENCOUNTER — Ambulatory Visit: Payer: Medicare HMO | Admitting: Nurse Practitioner

## 2022-10-20 ENCOUNTER — Encounter: Payer: Self-pay | Admitting: Nurse Practitioner

## 2022-10-20 ENCOUNTER — Ambulatory Visit: Payer: Medicare HMO | Admitting: Podiatry

## 2022-10-20 VITALS — BP 104/72 | HR 60 | Ht 78.0 in | Wt 233.2 lb

## 2022-10-20 DIAGNOSIS — E1122 Type 2 diabetes mellitus with diabetic chronic kidney disease: Secondary | ICD-10-CM | POA: Diagnosis not present

## 2022-10-20 DIAGNOSIS — E039 Hypothyroidism, unspecified: Secondary | ICD-10-CM | POA: Diagnosis not present

## 2022-10-20 DIAGNOSIS — Z794 Long term (current) use of insulin: Secondary | ICD-10-CM

## 2022-10-20 DIAGNOSIS — N1831 Chronic kidney disease, stage 3a: Secondary | ICD-10-CM | POA: Diagnosis not present

## 2022-10-20 DIAGNOSIS — E1165 Type 2 diabetes mellitus with hyperglycemia: Secondary | ICD-10-CM | POA: Diagnosis not present

## 2022-10-20 DIAGNOSIS — E1142 Type 2 diabetes mellitus with diabetic polyneuropathy: Secondary | ICD-10-CM

## 2022-10-20 MED ORDER — TRESIBA FLEXTOUCH 100 UNIT/ML ~~LOC~~ SOPN
20.0000 [IU] | PEN_INJECTOR | Freq: Every day | SUBCUTANEOUS | 3 refills | Status: DC
  Filled 2023-01-17: qty 18, 90d supply, fill #0

## 2022-10-20 MED ORDER — GABAPENTIN 300 MG PO CAPS
300.0000 mg | ORAL_CAPSULE | Freq: Three times a day (TID) | ORAL | 1 refills | Status: DC
  Filled 2023-01-17 – 2023-01-22 (×2): qty 270, 90d supply, fill #0

## 2022-10-20 NOTE — Progress Notes (Signed)
Endocrinology Follow Up Note       10/20/2022, 9:57 AM   Subjective:    Patient ID: Dylan Robles., male    DOB: November 27, 1940.  Dylan Robles. is being seen in follow up after being seen in consultation for management of currently uncontrolled symptomatic diabetes requested by  Billie Lade, MD.   Past Medical History:  Diagnosis Date   Acute lower UTI 09/20/2017   Bradycardia on ECG 07/31/2011   Coronary artery disease    hyperlipidemia   Decreased GFR 09/23/2019   Dehydration 09/21/2017   Diabetes mellitus    Diabetes mellitus without complication (HCC)    Hypercholesterolemia    Hypertension    Hypothyroidism    Left arm pain 01/29/2020   Paronychia of great toe of left foot 09/26/2018   Paronychia of great toe, left 09/26/2018   PERIPHERAL NEUROPATHY 01/13/2006   Qualifier: Diagnosis of  By: Jen Mow MD, Christine     Shortness of breath dyspnea    with exertion   Vasovagal syncope    Weakness 09/20/2017    Past Surgical History:  Procedure Laterality Date   APPENDECTOMY     CARDIAC CATHETERIZATION N/A 06/16/2014   Procedure: Left Heart Cath;  Surgeon: Laurier Nancy, MD;  Location: [facility];  Service: Cardiovascular;  Laterality: N/A;   CIRCUMCISION, NON-NEWBORN     HERNIA REPAIR     LOWER EXTREMITY ANGIOGRAPHY Left 12/24/2018   Procedure: LOWER EXTREMITY ANGIOGRAPHY;  Surgeon: Renford Dills, MD;  Location: [facility];  Service: Cardiovascular;  Laterality: Left;   THYROID SURGERY      Social History   Socioeconomic History   Marital status: Married    Spouse name: Not on file   Number of children: Not on file   Years of education: Not on file   Highest education level: Not on file  Occupational History   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  Tobacco Use   Smoking status: Former   Smokeless tobacco: Never  Advertising account planner   Vaping status: Never  Used  Substance and Sexual Activity   Alcohol use: No   Drug use: No   Sexual activity: Not on file  Other Topics Concern   Not on file  Social History Narrative   ** Merged History Encounter **       Social Determinants of Health   Financial Resource Strain: Low Risk  (03/29/2021)   Overall Financial Resource Strain (CARDIA)    Difficulty of Paying Living Expenses: Not very hard  Food Insecurity: No Food Insecurity (08/25/2022)   Hunger Vital Sign    Worried About Running Out of Food in the Last Year: Never true    Ran Out of Food in the Last Year: Never true  Transportation Needs: No Transportation Needs (08/25/2022)   PRAPARE - Administrator, Civil Service (Medical): No    Lack of Transportation (Non-Medical): No  Physical Activity: Insufficiently Active (03/29/2021)   Exercise Vital Sign    Days of Exercise per Week: 7 days    Minutes of Exercise per Session: 20 min  Stress: No Stress Concern Present (03/29/2021)  [employer] of Occupational Health - Occupational Stress Questionnaire    Feeling of Stress : Not at all  Social Connections: Socially Integrated (03/29/2021)   Social Connection and Isolation Panel [NHANES]    Frequency of Communication with Friends and Family: More than three times a week    Frequency of Social Gatherings with Friends and Family: More than three times a week    Attends Religious Services: More than 4 times per year    Active Member of [employer] or Organizations: Yes    Attends Banker Meetings: 1 to 4 times per year    Marital Status: Married    Family History  Problem Relation Age of Onset   Diabetes Father    Hypertension Father    Cancer Mother    Diabetes Brother     Outpatient Encounter Medications as of 10/20/2022  Medication Sig   apixaban (ELIQUIS) 2.5 MG TABS tablet Take 1 tablet (2.5 mg total) by mouth 2 (two) times daily.   atorvastatin (LIPITOR) 80 MG tablet TAKE 1 TABLET AT BEDTIME.   cetirizine  (ZYRTEC) 5 MG tablet Take 1 tablet (5 mg total) by mouth daily.   Continuous Blood Gluc Sensor (FREESTYLE LIBRE 2 SENSOR) MISC Use to check glucose at least 4 times daily   ezetimibe (ZETIA) 10 MG tablet TAKE 1 TABLET EVERY DAY   Fish Oil-Vitamin D 1000-1000 MG-UNIT CAPS Take by mouth once. daily   furosemide (LASIX) 20 MG tablet Take 20 mg by mouth as needed.   Glucagon (GVOKE HYPOPEN 2-PACK) 1 MG/0.2ML SOAJ Inject 1 mg into the skin daily as needed.   insulin aspart (NOVOLOG FLEXPEN) 100 UNIT/ML FlexPen Inject 10-16 Units into the skin 3 (three) times daily with meals.   isosorbide mononitrate (IMDUR) 60 MG 24 hr tablet Take 1 tablet by mouth once daily   levothyroxine (SYNTHROID) 88 MCG tablet TAKE 1 TABLET EVERY DAY BEFORE BREAKFAST   metoprolol succinate (TOPROL-XL) 50 MG 24 hr tablet TAKE 1 TABLET EVERY DAY   spironolactone (ALDACTONE) 25 MG tablet Take 1 tablet (25 mg total) by mouth daily.   tamsulosin (FLOMAX) 0.4 MG CAPS capsule Take 1 capsule (0.4 mg total) by mouth at bedtime.   [DISCONTINUED] gabapentin (NEURONTIN) 300 MG capsule Take 1 capsule (300 mg total) by mouth 3 (three) times daily.   [DISCONTINUED] insulin degludec (TRESIBA FLEXTOUCH) 100 UNIT/ML FlexTouch Pen Inject 16 Units into the skin at bedtime.   gabapentin (NEURONTIN) 300 MG capsule Take 1 capsule (300 mg total) by mouth 3 (three) times daily.   insulin degludec (TRESIBA FLEXTOUCH) 100 UNIT/ML FlexTouch Pen Inject 20 Units into the skin at bedtime.   triamcinolone cream (KENALOG) 0.1 % Apply 1 Application topically 2 (two) times daily. (Patient not taking: Reported on 08/21/2022)   No facility-administered encounter medications on file as of 10/20/2022.    ALLERGIES: Allergies  Allergen Reactions   Plavix [Clopidogrel] Hives    VACCINATION STATUS: Immunization History  Administered Date(s) Administered   Fluad Quad(high Dose 65+) 09/22/2019, 11/03/2020, 11/10/2021   Influenza Whole 10/05/2005, 12/17/2006    Influenza,inj,quad, With Preservative 11/25/2018   Moderna Sars-Covid-2 Vaccination 03/06/2019, 04/03/2019, 01/06/2020   Pneumococcal Conjugate-13 01/29/2020   Pneumococcal Polysaccharide-23 08/21/2005   Td 12/17/2006   Tdap 06/10/2021   Zoster Recombinant(Shingrix) 06/03/2021, 09/20/2021    Diabetes He presents for his follow-up diabetic visit. He has type 2 diabetes mellitus. Onset time: Diagnosed at approx age of 21. His disease course has been improving. There are no hypoglycemic associated  symptoms. Associated symptoms include foot paresthesias. There are no hypoglycemic complications. Symptoms are stable. Diabetic complications include heart disease, nephropathy, peripheral neuropathy and PVD. Risk factors for coronary artery disease include diabetes mellitus, dyslipidemia, family history, male sex and hypertension. Current diabetic treatment includes intensive insulin program. He is compliant with treatment most of the time. His weight is fluctuating minimally. He is following a generally healthy diet. Meal planning includes avoidance of concentrated sweets. He has had a previous visit with a dietitian. He participates in exercise intermittently. His home blood glucose trend is decreasing steadily. His breakfast blood glucose range is generally 140-180 mg/dl. His lunch blood glucose range is generally 180-200 mg/dl. His dinner blood glucose range is generally >200 mg/dl. His bedtime blood glucose range is generally 140-180 mg/dl. His overall blood glucose range is 180-200 mg/dl. (He presents today, accompanied by his wife, with his CGM showing improved, yet still slightly above target glycemic profile.  He was not yet due for another A1c.  Analysis of his CGM shows TIR 50%, TAR 50%, TBR 0%.  He did have a mild stroke since last visit here.) An ACE inhibitor/angiotensin II receptor blocker is being taken. He sees a podiatrist.Eye exam is current.     Review of systems  Constitutional: +  Minimally fluctuating body weight, current Body mass index is 26.95 kg/m., no fatigue, no subjective hyperthermia, no subjective hypothermia Eyes: no blurry vision, no xerophthalmia ENT: no sore throat, no nodules palpated in throat, no dysphagia/odynophagia, no hoarseness Cardiovascular: no chest pain, no shortness of breath, no palpitations, no leg swelling Respiratory: no cough, no shortness of breath Gastrointestinal: no nausea/vomiting/diarrhea Musculoskeletal: no muscle/joint aches, walks with cane/walker Skin: no rashes, no hyperemia Neurological: no tremors, + numbness/tingling to BLE, no dizziness Psychiatric: no depression, no anxiety   Objective:     BP 104/72 (BP Location: Left Arm, Patient Position: Sitting, Cuff Size: Large)   Pulse 60   Ht 6\' 6"  (1.981 m)   Wt 233 lb 3.2 oz (105.8 kg)   BMI 26.95 kg/m   Wt Readings from Last 3 Encounters:  10/20/22 233 lb 3.2 oz (105.8 kg)  09/18/22 234 lb (106.1 kg)  09/01/22 234 lb 9.6 oz (106.4 kg)     BP Readings from Last 3 Encounters:  10/20/22 104/72  09/18/22 120/69  09/01/22 118/66     Physical Exam- Limited  Constitutional:  Body mass index is 26.95 kg/m. , not in acute distress, normal state of mind Eyes:  EOMI, no exophthalmos Musculoskeletal: no gross deformities, strength intact in all four extremities, no gross restriction of joint movements, walks with cane/walker Skin:  no rashes, no hyperemia Neurological: no tremor with outstretched hands    CMP ( most recent) CMP     Component Value Date/Time   NA 134 (L) 08/30/2022 1509   NA 139 05/10/2022 0917   K 4.9 08/30/2022 1509   CL 106 08/30/2022 1509   CO2 22 08/30/2022 1509   GLUCOSE 138 (H) 08/30/2022 1509   BUN 34 (H) 08/30/2022 1509   BUN 14 05/10/2022 0917   CREATININE 1.71 (H) 08/30/2022 1509   CREATININE 1.34 (H) 02/02/2021 0839   CALCIUM 9.4 08/30/2022 1509   PROT 6.4 (L) 08/20/2022 1955   PROT 6.0 05/10/2022 0917   ALBUMIN 3.3 (L)  08/20/2022 1955   ALBUMIN 3.9 05/10/2022 0917   AST 24 08/20/2022 1955   ALT 28 08/20/2022 1955   ALKPHOS 95 08/20/2022 1955   BILITOT 0.8 08/20/2022 1955   BILITOT  1.1 05/10/2022 0917   GFRNONAA 39 (L) 08/30/2022 1509   GFRNONAA 54 (L) 04/29/2020 1141   GFRAA 63 04/29/2020 1141     Diabetic Labs (most recent): Lab Results  Component Value Date   HGBA1C 10.1 (H) 08/21/2022   HGBA1C 10.1 (A) 04/20/2022   HGBA1C 8.3 (A) 01/18/2022   MICROALBUR 5.0 02/02/2021   MICROALBUR 1.4 09/22/2019   MICROALBUR 0.67 12/17/2006     Lipid Panel ( most recent) Lipid Panel     Component Value Date/Time   CHOL 126 08/21/2022 0502   CHOL 125 05/10/2022 0917   TRIG 74 08/21/2022 0502   HDL 34 (L) 08/21/2022 0502   HDL 46 05/10/2022 0917   CHOLHDL 3.7 08/21/2022 0502   VLDL 15 08/21/2022 0502   LDLCALC 77 08/21/2022 0502   LDLCALC 65 05/10/2022 0917   LDLCALC 73 11/03/2020 1032   LABVLDL 14 05/10/2022 0917      Lab Results  Component Value Date   TSH 1.510 08/20/2022   TSH 1.730 05/10/2022   TSH 2.140 12/23/2021   TSH 1.790 06/14/2021   TSH 1.45 11/03/2020   TSH 1.75 03/31/2020   TSH 1.17 09/22/2019   TSH 0.94 03/19/2019   TSH 1.039 09/20/2017   TSH 0.26 (A) 03/27/2014   FREET4 1.45 05/10/2022   FREET4 1.41 12/23/2021   FREET4 1.3 09/22/2019   FREET4 1.3 03/19/2019   FREET4 1.32 07/31/2011   FREET4 1.09 06/17/2008   FREET4 1.31 12/18/2007   FREET4 1.27 05/10/2006           Assessment & Plan:   1) Type 2 diabetes mellitus with stage 3a chronic kidney disease, with long-term current use of insulin (HCC)  He presents today, accompanied by his wife, with his CGM showing improved, yet still slightly above target glycemic profile.  He was not yet due for another A1c.  Analysis of his CGM shows TIR 50%, TAR 50%, TBR 0%.  He did have a mild stroke since last visit here.  - Dylan Robles. has currently uncontrolled symptomatic type 2 DM since 82 years of age.    -Recent labs reviewed.  - I had a long discussion with him about the progressive nature of diabetes and the pathology behind its complications. -his diabetes is complicated by CAD, CKD, PVD, neuropathy and he remains at a high risk for more acute and chronic complications which include CAD, CVA, CKD, retinopathy, and neuropathy. These are all discussed in detail with him.  The following Lifestyle Medicine recommendations according to American College of Lifestyle Medicine [facility]) were discussed and offered to patient and he agrees to start the journey:  A. Whole Foods, Plant-based plate comprising of fruits and vegetables, plant-based proteins, whole-grain carbohydrates was discussed in detail with the patient.   A list for source of those nutrients were also provided to the patient.  Patient will use only water or unsweetened tea for hydration. B.  The need to stay away from risky substances including alcohol, smoking; obtaining 7 to 9 hours of restorative sleep, at least 150 minutes of moderate intensity exercise weekly, the importance of healthy social connections,  and stress reduction techniques were discussed. C.  A full color page of  Calorie density of various food groups per pound showing examples of each food groups was provided to the patient.  - Nutritional counseling repeated at each appointment due to patients tendency to fall back in to old habits.  - The patient admits there is a room for improvement  in their diet and drink choices. -  Suggestion is made for the patient to avoid simple carbohydrates from their diet including Cakes, Sweet Desserts / Pastries, Ice Cream, Soda (diet and regular), Sweet Tea, Candies, Chips, Cookies, Sweet Pastries, Store Bought Juices, Alcohol in Excess of 1-2 drinks a day, Artificial Sweeteners, Coffee Creamer, and "Sugar-free" Products. This will help patient to have stable blood glucose profile and potentially avoid unintended weight gain.   - I  encouraged the patient to switch to unprocessed or minimally processed complex starch and increased protein intake (animal or plant source), fruits, and vegetables.   - Patient is advised to stick to a routine mealtimes to eat 3 meals a day and avoid unnecessary snacks (to snack only to correct hypoglycemia).  - I have approached him with the following individualized plan to manage his diabetes and patient agrees:   -He is advised to increase his [place] to 20 units SQ nightly and continue Novolog 10-16 units TID with meals if glucose is above 90 and he is eating (Specific instructions on how to titrate insulin dosage based on glucose readings given to patient in writing).    -he is encouraged to continue monitoring glucose 4 times daily (using his CGM), before meals and before bed, and to call the clinic if he has readings less than 70 or above 300 for 3 tests in a row.  - he is warned not to take insulin without proper monitoring per orders. - Adjustment parameters are given to him for hypo and hyperglycemia in writing.  - his Glipizide was be discontinued, risk outweighs benefit for this patient- risk of hypoglycemia given advanced age and CKD. - he is not a candidate for full dose Metformin due to concurrent renal insufficiency, I discontinued this on previously to de-escalate his treatment and preserve kidney function.  - he is not an ideal candidate for incretin therapy due to body habitus with BMI at 25.  -I would not consider him to be a great candidate for SGLT2i despite his CHF history as it may increase his risk of falls (due to polyuria), UTI, and yeast formation.  - Specific targets for  A1c; LDL, HDL, and Triglycerides were discussed with the patient.  2) Blood Pressure /Hypertension:  his blood pressure is controlled to target for his age.   he is advised to continue his current medications including Norvasc 5 mg p.o. daily with breakfast, Lasix 20 mg po daily as needed,  Lisinopril 20 mg po daily and Metoprolol 50 mg po daily.  3) Lipids/Hyperlipidemia:    Review of his recent lipid panel from 05/10/22 showed controlled LDL at 65.  he is advised to continue Lipitor 80 mg daily at bedtime and Zetia 10 mg po daily.  Side effects and precautions discussed with him.    4)  Weight/Diet:  his Body mass index is 26.95 kg/m.  -  he is NOT a candidate for weight loss.  Exercise, and detailed carbohydrates information provided  -  detailed on discharge instructions.  5) Hypothyroidism-unspecified The details surrounding his diagnosis are not available.    His previsit TFTs are consistent with appropriate hormone replacement.  He is advised to continue his Levothyroxine 88 mcg po daily before breakfast.     - The correct intake of thyroid hormone (Levothyroxine, Synthroid), is on empty stomach first thing in the morning, with water, separated by at least 30 minutes from breakfast and other medications,  and separated by more than 4 hours from calcium,  iron, multivitamins, acid reflux medications (PPIs).  - This medication is a life-long medication and will be needed to correct thyroid hormone imbalances for the rest of your life.  The dose may change from time to time, based on thyroid blood work.  - It is extremely important to be consistent taking this medication, near the same time each morning.  -AVOID TAKING PRODUCTS CONTAINING BIOTIN (commonly found in Hair, Skin, Nails vitamins) AS IT INTERFERES WITH THE VALIDITY OF THYROID FUNCTION BLOOD TESTS.  6) Vitamin D deficiency His most recent vitamin D level from 05/10/22 was slightly low at 28.8.  He is already taking supplement but not sure of the amount.  I advised to take Vitamin d3 5000 units daily.  7) Chronic Care/Health Maintenance: -he is on ACEI/ARB and Statin medications and is encouraged to initiate and continue to follow up with Ophthalmology, Dentist, Podiatrist at least yearly or according to  recommendations, and advised to stay away from smoking. I have recommended yearly flu vaccine and pneumonia vaccine at least every 5 years; moderate intensity exercise for up to 150 minutes weekly; and sleep for at least 7 hours a day.  - he is advised to maintain close follow up with Durwin Nora, Lucina Mellow, MD for primary care needs, as well as his other providers for optimal and coordinated care.     I spent  26  minutes in the care of the patient today including review of labs from CMP, Lipids, Thyroid Function, Hematology (current and previous including abstractions from other facilities); face-to-face time discussing  his blood glucose readings/logs, discussing hypoglycemia and hyperglycemia episodes and symptoms, medications doses, his options of short and long term treatment based on the latest standards of care / guidelines;  discussion about incorporating lifestyle medicine;  and documenting the encounter. Risk reduction counseling performed per USPSTF guidelines to reduce obesity and cardiovascular risk factors.     Please refer to Patient Instructions for Blood Glucose Monitoring and Insulin/Medications Dosing Guide"  in media tab for additional information. Please  also refer to " Patient Self Inventory" in the Media  tab for reviewed elements of pertinent patient history.  Chelsea Primus Porum. participated in the discussions, expressed understanding, and voiced agreement with the above plans.  All questions were answered to his satisfaction. he is encouraged to contact clinic should he have any questions or concerns prior to his return visit.     Follow up plan: - Return in about 3 months (around 01/20/2023) for Diabetes F/U with A1c in office, Bring meter and logs.  Ronny Bacon, [facility] [address] [place], [place] [address] Phone: [phone] Fax: [phone]  10/20/2022, 9:57 AM

## 2022-10-23 ENCOUNTER — Ambulatory Visit: Payer: Medicare HMO | Admitting: Podiatry

## 2022-10-23 DIAGNOSIS — B351 Tinea unguium: Secondary | ICD-10-CM

## 2022-10-23 DIAGNOSIS — M2042 Other hammer toe(s) (acquired), left foot: Secondary | ICD-10-CM | POA: Diagnosis not present

## 2022-10-23 DIAGNOSIS — I739 Peripheral vascular disease, unspecified: Secondary | ICD-10-CM

## 2022-10-23 DIAGNOSIS — E119 Type 2 diabetes mellitus without complications: Secondary | ICD-10-CM | POA: Diagnosis not present

## 2022-10-23 DIAGNOSIS — M79675 Pain in left toe(s): Secondary | ICD-10-CM | POA: Diagnosis not present

## 2022-10-23 DIAGNOSIS — E1142 Type 2 diabetes mellitus with diabetic polyneuropathy: Secondary | ICD-10-CM

## 2022-10-23 DIAGNOSIS — M2041 Other hammer toe(s) (acquired), right foot: Secondary | ICD-10-CM | POA: Diagnosis not present

## 2022-10-23 DIAGNOSIS — M79674 Pain in right toe(s): Secondary | ICD-10-CM

## 2022-10-23 NOTE — Patient Instructions (Addendum)
Diabetes Mellitus and Foot Care Diabetes, also called diabetes mellitus, may cause problems with your feet and legs because of poor blood flow (circulation). Poor circulation may make your skin: Become thinner and drier. Break more easily. Heal more slowly. Peel and crack. You may also have nerve damage (neuropathy). This can cause decreased feeling in your legs and feet. This means that you may not notice minor injuries to your feet that could lead to more serious problems. Finding and treating problems early is the best way to prevent future foot problems. How to care for your feet Foot hygiene  Wash your feet daily with warm water and mild soap. Do not use hot water. Then, pat your feet and the areas between your toes until they are fully dry. Do not soak your feet. This can dry your skin. Trim your toenails straight across. Do not dig under them or around the cuticle. File the edges of your nails with an emery board or nail file. Apply a moisturizing lotion or petroleum jelly to the skin on your feet and to dry, brittle toenails. Use lotion that does not contain alcohol and is unscented. Do not apply lotion between your toes. Shoes and socks Wear clean socks or stockings every day. Make sure they are not too tight. Do not wear knee-high stockings. These may decrease blood flow to your legs. Wear shoes that fit well and have enough cushioning. Always look in your shoes before you put them on to be sure there are no objects inside. To break in new shoes, wear them for just a few hours a day. This prevents injuries on your feet. Wounds, scrapes, corns, and calluses  Check your feet daily for blisters, cuts, bruises, sores, and redness. If you cannot see the bottom of your feet, use a mirror or ask someone for help. Do not cut off corns or calluses or try to remove them with medicine. If you find a minor scrape, cut, or break in the skin on your feet, keep it and the skin around it clean and  dry. You may clean these areas with mild soap and water. Do not clean the area with peroxide, alcohol, or iodine. If you have a wound, scrape, corn, or callus on your foot, look at it several times a day to make sure it is healing and not infected. Check for: Redness, swelling, or pain. Fluid or blood. Warmth. Pus or a bad smell. General tips Do not cross your legs. This may decrease blood flow to your feet. Do not use heating pads or hot water bottles on your feet. They may burn your skin. If you have lost feeling in your feet or legs, you may not know this is happening until it is too late. Protect your feet from hot and cold by wearing shoes, such as at the beach or on hot pavement. Schedule a complete foot exam at least once a year or more often if you have foot problems. Report any cuts, sores, or bruises to your health care provider right away. Where to find more information American Diabetes Association: diabetes.org Association of Diabetes Care & Education Specialists: diabeteseducator.org Contact a health care provider if: You have a condition that increases your risk of infection, and you have any cuts, sores, or bruises on your feet. You have an injury that is not healing. You have redness on your legs or feet. You feel burning or tingling in your legs or feet. You have pain or cramps in your legs  and feet. Your legs or feet are numb. Your feet always feel cold. You have pain around any toenails. Get help right away if: You have a wound, scrape, corn, or callus on your foot and: You have signs of infection. You have a fever. You have a red line going up your leg. This information is not intended to replace advice given to you by your health care provider. Make sure you discuss any questions you have with your health care provider. Document Revised: 06/29/2021 Document Reviewed: 06/29/2021 Elsevier Patient Education  2024 [employer]  Diabetic Neuropathy Diabetic  neuropathy refers to nerve damage that is caused by diabetes. Over time, people with diabetes can develop nerve damage throughout the body. There are several types of diabetic neuropathy: Peripheral neuropathy. This is the most common type of diabetic neuropathy. It damages the nerves that carry signals between the spinal cord and other parts of the body (peripheral nerves). This usually affects nerves in the feet, legs, hands, and arms. Autonomic neuropathy. This type causes damage to nerves that control involuntary functions (autonomic nerves). Involuntary functions are functions of the body that you do not control. They include heartbeat, body temperature, blood pressure, urination, digestion, sweating, sexual function, or response to changes in blood glucose. Focal neuropathy. This type of nerve damage affects one area of the body, such as an arm, a leg, or the face. The injury may involve one nerve or a small group of nerves. Focal neuropathy can be painful and unpredictable. It occurs most often in older adults with diabetes. This often develops suddenly, but usually improves over time and does not cause long-term problems. Proximal neuropathy. This type of nerve damage affects the nerves of the thighs, hips, buttocks, or legs. It causes severe pain, weakness, and muscle death (atrophy), usually in the thigh muscles. It is more common among older men and people who have type 2 diabetes. The length of recovery time may vary. What are the causes? Peripheral, autonomic, and focal neuropathies are caused by diabetes that is not well controlled with treatment. The cause of proximal neuropathy is not known, but it may be caused by inflammation related to uncontrolled blood glucose levels. What are the signs or symptoms? Peripheral neuropathy Peripheral neuropathy develops slowly over time. When the nerves of the feet and legs no longer work, you may experience: Burning, stabbing, or aching pain in the legs  or feet. Pain or cramping in the legs or feet. Loss of feeling (numbness) and inability to feel pressure or pain in the feet. This can lead to: Thick calluses or sores on areas of constant pressure. Ulcers. Reduced ability to feel temperature changes. Foot deformities. Muscle weakness. Loss of balance or coordination. Autonomic neuropathy The symptoms of autonomic neuropathy vary depending on which nerves are affected. Symptoms may include: Problems with digestion, such as: Nausea or vomiting. Poor appetite. Bloating. Diarrhea or constipation. Trouble swallowing. Losing weight without trying to. Problems with the heart, blood, and lungs, such as: Dizziness, especially when standing up. Fainting. Shortness of breath. Irregular heartbeat. Bladder problems, such as: Trouble starting or stopping urination. Leaking urine. Trouble emptying the bladder. Urinary tract infections (UTIs). Problems with other body functions, such as: Sweat. You may sweat too much or too little. Temperature. You might get hot easily. Or, you might feel cold more than usual. Sexual function. Men may not be able to get or maintain an erection. Women may have vaginal dryness and difficulty with arousal. Focal neuropathy Symptoms affect only one area  of the body. Common symptoms include: Numbness. Tingling. Burning pain. Prickling feeling. Very sensitive skin. Weakness. Inability to move (paralysis). Muscle twitching. Muscles getting smaller (wasting). Poor coordination. Double or blurred vision. Proximal neuropathy Sudden, severe pain in the hip, thigh, or buttocks. Pain may spread from the back into the legs (sciatica). Pain and numbness in the arms and legs. Tingling. Loss of bladder control or bowel control. Weakness and wasting of thigh muscles. Difficulty getting up from a seated position. Abdominal swelling. Unexplained weight loss. How is this diagnosed? Diagnosis varies depending on  the type of neuropathy your health care provider suspects. Peripheral neuropathy Your health care provider will do a neurologic exam. This exam checks your reflexes, how you move, and what you can feel. You may have other tests, such as: Blood tests. Tests of the fluid that surrounds the spinal cord (lumbar puncture). CT scan. MRI. Checking the nerves that control muscles (electromyogram, or EMG). Checking how quickly signals pass through your nerves (nerve conduction study). Checking a small piece of a nerve using a microscope (biopsy). Autonomic neuropathy You may have tests, such as: Tests to measure your blood pressure and heart rate. You may be secured to an exam table that moves you from a lying position to an upright position (table tilt test). Breathing tests to check your lungs. Tests to check how food moves through the digestive system (gastric emptying tests). Blood, sweat, or urine tests. Ultrasound of your bladder. Spinal fluid tests. Focal neuropathy This condition may be diagnosed with: A neurologic exam. CT scan. MRI. EMG. Nerve conduction study. Proximal neuropathy There is no test to diagnose this type of neuropathy. You may have tests to rule out other possible causes of this type of neuropathy. Tests may include: X-rays of your spine and lumbar region. Lumbar puncture. MRI. How is this treated? The goal of treatment is to keep nerve damage from getting worse. Treatment may include: Following your diabetes management plan. This will help keep your blood glucose level and your A1C level within your target range. This is the most important treatment. Using prescription pain medicine. Follow these instructions at home: Diabetes management Follow your diabetes management plan as told by your health care provider. Check your blood glucose levels. Keep your blood glucose in your target range. Have your A1C level checked at least two times a year, or as often as  told. Take over the counter and prescription medicines only as told by your health care provider. This includes insulin and diabetes medicine.  Lifestyle  Do not use any products that contain nicotine or tobacco, such as cigarettes, e-cigarettes, and chewing tobacco. If you need help quitting, ask your health care provider. Be physically active every day. Include strength training and balance exercises. Follow a healthy meal plan. Work with your health care provider to manage your blood pressure. General instructions Ask your health care provider if the medicine prescribed to you requires you to avoid driving or using machinery. Check your skin and feet every day for cuts, bruises, redness, blisters, or sores. Keep all follow-up visits. This is important. Contact a health care provider if: You have burning, stabbing, or aching pain in your legs or feet. You are unable to feel pressure or pain in your feet. You develop problems with digestion, such as: Nausea. Vomiting. Bloating. Constipation. Diarrhea. Abdominal pain. You have difficulty with urination, such as: Inability to control when you urinate (incontinence). Inability to completely empty the bladder (retention). You feel as if your heart  is racing (palpitations). You feel dizzy, weak, or faint when you stand up. Get help right away if: You cannot urinate. You have sudden weakness or loss of coordination. You have trouble speaking. You have pain or pressure in your chest. You have an irregular heartbeat. You have sudden inability to move a part of your body. These symptoms may represent a serious problem that is an emergency. Do not wait to see if the symptoms will go away. Get medical help right away. Call your local emergency services (911 in the [place] Do not drive yourself to the hospital. Summary Diabetic neuropathy is nerve damage that is caused by diabetes. It can cause numbness and pain in the arms, legs, digestive  tract, heart, and other body systems. This condition is treated by keeping your blood glucose level and your A1C level within your target range. This can help prevent neuropathy from getting worse. Check your skin and feet every day for cuts, bruises, redness, blisters, or sores. Do not use any products that contain nicotine or tobacco, such as cigarettes, e-cigarettes, and chewing tobacco. This information is not intended to replace advice given to you by your health care provider. Make sure you discuss any questions you have with your health care provider. Document Revised: 05/08/2019 Document Reviewed: 05/08/2019 Elsevier Patient Education  2024 [employer].

## 2022-10-23 NOTE — Progress Notes (Signed)
ANNUAL DIABETIC FOOT EXAM  Subjective: Dylan Robles. presents today annual diabetic foot exam.  Chief Complaint  Patient presents with   Diabetes    PCPV-10/2022   Patient confirms h/o diabetes.  Patient has h/o left foot wound.  Patient has been diagnosed with neuropathy.  Dylan Lade, MD is patient's PCP.  Past Medical History:  Diagnosis Date   Acute lower UTI 09/20/2017   Bradycardia on ECG 07/31/2011   Coronary artery disease    hyperlipidemia   Decreased GFR 09/23/2019   Dehydration 09/21/2017   Diabetes mellitus    Diabetes mellitus without complication (HCC)    Hypercholesterolemia    Hypertension    Hypothyroidism    Left arm pain 01/29/2020   Paronychia of great toe of left foot 09/26/2018   Paronychia of great toe, left 09/26/2018   PERIPHERAL NEUROPATHY 01/13/2006   Qualifier: Diagnosis of  By: Jen Mow MD, Christine     Shortness of breath dyspnea    with exertion   Vasovagal syncope    Weakness 09/20/2017   Patient Active Problem List   Diagnosis Date Noted   Stroke [facility]) 10/27/2022   Bladder trabeculation 10/11/2022   Acute CVA (cerebrovascular accident) (HCC) 08/23/2022   TIA (transient ischemic attack) 08/20/2022   Type II diabetes mellitus with renal manifestations (HCC) 08/20/2022   Acute renal failure superimposed on stage 3a chronic kidney disease (HCC) 08/20/2022   Chronic diastolic CHF (congestive heart failure) (HCC) 08/20/2022   New onset atrial fibrillation (HCC) 08/20/2022   Acute metabolic encephalopathy 08/20/2022   CAD (coronary artery disease) 08/20/2022   BPH (benign prostatic hyperplasia) 06/14/2022   Venous stasis dermatitis of right lower extremity 06/14/2022   SOB (shortness of breath) 04/18/2022   Bilateral lower extremity edema 02/15/2022   Overweight (BMI 25.0-29.9) 06/10/2021   Proctitis 05/17/2021   Stage 3a chronic kidney disease (HCC) 03/03/2020   Atherosclerosis of native arteries of extremity with intermittent  claudication (HCC) 12/19/2018   Hyperlipidemia 12/19/2018   Diabetic neuropathy (HCC) 09/26/2018   Type 2 diabetes mellitus with vascular disease (HCC) 09/26/2018   Coronary artery disease of native artery of native heart with stable angina pectoris (HCC) 09/20/2017   Type 2 diabetes mellitus with hyperlipidemia (HCC) 09/16/2008   Hypothyroidism 03/29/2006   Essential hypertension 01/13/2006   PERIPHERAL VASCULAR DISEASE 01/13/2006   Past Surgical History:  Procedure Laterality Date   APPENDECTOMY     CARDIAC CATHETERIZATION N/A 06/16/2014   Procedure: Left Heart Cath;  Surgeon: Laurier Nancy, MD;  Location: [facility] INVASIVE CV LAB;  Service: Cardiovascular;  Laterality: N/A;   CIRCUMCISION, NON-NEWBORN     HERNIA REPAIR     LOWER EXTREMITY ANGIOGRAPHY Left 12/24/2018   Procedure: LOWER EXTREMITY ANGIOGRAPHY;  Surgeon: Renford Dills, MD;  Location: [facility] INVASIVE CV LAB;  Service: Cardiovascular;  Laterality: Left;   THYROID SURGERY     Current Outpatient Medications on File Prior to Visit  Medication Sig Dispense Refill   apixaban (ELIQUIS) 2.5 MG TABS tablet Take 1 tablet (2.5 mg total) by mouth 2 (two) times daily. 60 tablet 3   atorvastatin (LIPITOR) 80 MG tablet TAKE 1 TABLET AT BEDTIME. 90 tablet 1   cetirizine (ZYRTEC) 5 MG tablet Take 1 tablet (5 mg total) by mouth daily. 90 tablet 1   Continuous Blood Gluc Sensor (FREESTYLE LIBRE 2 SENSOR) MISC Use to check glucose at least 4 times daily 6 each 3   ezetimibe (ZETIA) 10 MG tablet TAKE 1 TABLET EVERY DAY  90 tablet 1   Fish Oil-Vitamin D 1000-1000 MG-UNIT CAPS Take by mouth once. daily     furosemide (LASIX) 20 MG tablet Take 20 mg by mouth as needed.     gabapentin (NEURONTIN) 300 MG capsule Take 1 capsule (300 mg total) by mouth 3 (three) times daily. 270 capsule 1   Glucagon (GVOKE HYPOPEN 2-PACK) 1 MG/0.2ML SOAJ Inject 1 mg into the skin daily as needed. 0.4 mL 1   insulin aspart (NOVOLOG FLEXPEN) 100 UNIT/ML FlexPen Inject  10-16 Units into the skin 3 (three) times daily with meals. 30 mL 3   insulin degludec (TRESIBA FLEXTOUCH) 100 UNIT/ML FlexTouch Pen Inject 20 Units into the skin at bedtime. 18 mL 3   isosorbide mononitrate (IMDUR) 60 MG 24 hr tablet Take 1 tablet by mouth once daily 30 tablet 0   levothyroxine (SYNTHROID) 88 MCG tablet TAKE 1 TABLET EVERY DAY BEFORE BREAKFAST 90 tablet 3   metoprolol succinate (TOPROL-XL) 50 MG 24 hr tablet TAKE 1 TABLET EVERY DAY 90 tablet 3   spironolactone (ALDACTONE) 25 MG tablet Take 1 tablet (25 mg total) by mouth daily. 30 tablet 11   triamcinolone cream (KENALOG) 0.1 % Apply 1 Application topically 2 (two) times daily. 15 g 1   No current facility-administered medications on file prior to visit.    Allergies  Allergen Reactions   Plavix [Clopidogrel] Hives   Social History   Occupational History   Occupation: Naval architect    Comment: Retired  Tobacco Use   Smoking status: Former   Smokeless tobacco: Never  Building services engineer status: Never Used  Substance and Sexual Activity   Alcohol use: No   Drug use: No   Sexual activity: Not on file   Family History  Problem Relation Age of Onset   Diabetes Father    Hypertension Father    Cancer Mother    Diabetes Brother    Immunization History  Administered Date(s) Administered   Fluad Quad(high Dose 65+) 09/22/2019, 11/03/2020, 11/10/2021   Influenza Whole 10/05/2005, 12/17/2006   Influenza,inj,quad, With Preservative 11/25/2018   Moderna Sars-Covid-2 Vaccination 03/06/2019, 04/03/2019, 01/06/2020   Pneumococcal Conjugate-13 01/29/2020   Pneumococcal Polysaccharide-23 08/21/2005   Td 12/17/2006   Tdap 06/10/2021   Zoster Recombinant(Shingrix) 06/03/2021, 09/20/2021     Review of Systems: Negative except as noted in the HPI.   Objective: There were no vitals filed for this visit.  Dylan Snell. is a pleasant 82 y.o. male in NAD. AAO X 3.   Title   Diabetic Foot Exam - detailed Is  there a history of foot ulcer?: Yes Is there a foot ulcer now?: No Is there swelling?: Yes Is there elevated skin temperature?: No Is there abnormal foot shape?: No Are the toenails long?: Yes Are the toenails thick?: Yes Are the toenails ingrown?: No Is the skin thin, fragile, shiny and hairless?": Yes Normal Range of Motion?: Yes Is there foot or ankle muscle weakness?: No Do you have pain in calf while walking?: No Are the shoes appropriate in style and fit?: Yes Can the patient see the bottom of their feet?: No Right Posterior Tibialis: Absent Left posterior Tibialis: Absent   Right Dorsalis Pedis: Absent Left Dorsalis Pedis: Absent     Semmes-Weinstein Monofilament Test "+" means "has sensation" and "-" means "no sensation"  R Foot Test Control: Neg L Foot Test Control: Neg   R Site 1-Great Toe: Neg L Site 1-Great Toe: Neg   R Site  4: Neg L Site 4: Neg   R site 5: Neg L Site 5: Neg  R Site 6: Neg L Site 6: Neg     Image components are not supported.   Image components are not supported. Image components are not supported.  T[place] Right vibratory: present Left vibratory: present  Comments +1 edema RLE. Muscle strength 5/5 to all lower extremity muscle groups bilaterally. Hammertoe(s) noted to the bilateral 2nd toes, bilateral 3rd toes, and bilateral 4th toes.      Lab Results  Component Value Date   HGBA1C 10.1 (H) 08/21/2022   ADA Risk Categorization: High Risk  Patient has one or more of the following: Loss of protective sensation Absent pedal pulses Severe Foot deformity History of foot ulcer  Assessment: 1. Pain due to onychomycosis of toenails of both feet   2. Acquired hammertoes of both feet   3. PAD (peripheral artery disease) (HCC)   4. Diabetic polyneuropathy associated with type 2 diabetes mellitus (HCC)   5. Encounter for diabetic foot exam (HCC)     Plan: -Consent given for treatment as described below: -Examined patient. -Diabetic  foot examination performed today. -Continue diabetic foot care principles: inspect feet daily, monitor glucose as recommended by PCP and/or Endocrinologist, and follow prescribed diet per PCP, Endocrinologist and/or dietician. -Patient to continue soft, supportive shoe gear daily. -Toenails 1-5 b/l were debrided in length and girth with sterile nail nippers and dremel without iatrogenic bleeding.  -Patient/POA to call should there be question/concern in the interim. Return in about 3 months (around 01/23/2023).  Dylan Robles, DPM

## 2022-10-27 ENCOUNTER — Encounter: Payer: Self-pay | Admitting: Urology

## 2022-10-27 ENCOUNTER — Ambulatory Visit: Payer: Medicare HMO | Admitting: Urology

## 2022-10-27 VITALS — BP 156/74 | HR 74 | Temp 97.9°F | Ht 78.0 in | Wt 233.0 lb

## 2022-10-27 DIAGNOSIS — N138 Other obstructive and reflux uropathy: Secondary | ICD-10-CM | POA: Diagnosis not present

## 2022-10-27 DIAGNOSIS — N401 Enlarged prostate with lower urinary tract symptoms: Secondary | ICD-10-CM | POA: Diagnosis not present

## 2022-10-27 DIAGNOSIS — N3289 Other specified disorders of bladder: Secondary | ICD-10-CM

## 2022-10-27 DIAGNOSIS — R339 Retention of urine, unspecified: Secondary | ICD-10-CM

## 2022-10-27 DIAGNOSIS — I639 Cerebral infarction, unspecified: Secondary | ICD-10-CM | POA: Insufficient documentation

## 2022-10-27 DIAGNOSIS — Z87438 Personal history of other diseases of male genital organs: Secondary | ICD-10-CM

## 2022-10-27 DIAGNOSIS — R3914 Feeling of incomplete bladder emptying: Secondary | ICD-10-CM | POA: Diagnosis not present

## 2022-10-27 DIAGNOSIS — N320 Bladder-neck obstruction: Secondary | ICD-10-CM

## 2022-10-27 LAB — BLADDER SCAN AMB NON-IMAGING: Scan Result: 345

## 2022-10-27 LAB — URINALYSIS, ROUTINE W REFLEX MICROSCOPIC
Bilirubin, UA: NEGATIVE
Ketones, UA: NEGATIVE
Leukocytes,UA: NEGATIVE
Nitrite, UA: NEGATIVE
Protein,UA: NEGATIVE
RBC, UA: NEGATIVE
Specific Gravity, UA: 1.025 (ref 1.005–1.030)
Urobilinogen, Ur: 0.2 mg/dL (ref 0.2–1.0)
pH, UA: 6 (ref 5.0–7.5)

## 2022-10-27 MED ORDER — TAMSULOSIN HCL 0.4 MG PO CAPS
0.4000 mg | ORAL_CAPSULE | Freq: Two times a day (BID) | ORAL | 3 refills | Status: DC
  Filled 2023-01-17 – 2023-01-22 (×2): qty 180, 90d supply, fill #0
  Filled 2023-04-18: qty 180, 90d supply, fill #1

## 2022-10-27 NOTE — Progress Notes (Signed)
Uroflow  Peak Flow: 2ml Average Flow: 2ml Voided Volume: 57ml Voiding Time: 28sec Flow Time: 26sec Time to Peak Flow: 6sec

## 2022-10-27 NOTE — Progress Notes (Addendum)
post void residual =364ml  Simple Catheter Placement  Due to urinary retention patient is present today for a foley cath placement.  Patient was cleaned and prepped in a sterile fashion with betadine and 2% lidocaine jelly was instilled into the urethra. A 16 FR foley catheter was inserted, urine return was noted  , urine was yellow in color.  The balloon was filled with 10cc of sterile water.  A leg bag was attached for drainage. Patient was also given a night bag to take home and was given instruction on how to change from one bag to another.  Patient was given instruction on proper catheter care.  Patient tolerated well, no complications were noted   Performed by: Guss Bunde, CMA  Additional notes/ Follow up: Return for 1st available cystoscopy with any urology MD.

## 2022-10-27 NOTE — Addendum Note (Signed)
Addended by: Grier Rocher on: 10/27/2022 04:24 PM   Modules accepted: Orders

## 2022-11-02 ENCOUNTER — Telehealth: Payer: Self-pay | Admitting: Urology

## 2022-11-02 ENCOUNTER — Encounter: Payer: Self-pay | Admitting: Urology

## 2022-11-02 NOTE — Telephone Encounter (Signed)
Dr Wolfgang [place] office called they never tested patient for PSA . They have no results to send.

## 2022-11-03 NOTE — Telephone Encounter (Signed)
Wife called and states that patient is still having discomfort from the catheter.  Wife states the foley catheter is patent and intact. Wife states when patient sits up in bed it is uncomfortable for him.  Advised wife to have pt reposition himself and to make sure catheter is not tangled. Make sure the catheter is in the anchor correctly and try to keep the bag empty. Wife made aware that if patient stop passing urine goes ito retention to call office or go to ER if office is closed.  Wife voiced understanding.

## 2022-11-04 ENCOUNTER — Other Ambulatory Visit: Payer: Self-pay | Admitting: Internal Medicine

## 2022-11-06 ENCOUNTER — Ambulatory Visit (INDEPENDENT_AMBULATORY_CARE_PROVIDER_SITE_OTHER): Payer: Medicare HMO

## 2022-11-06 DIAGNOSIS — R339 Retention of urine, unspecified: Secondary | ICD-10-CM

## 2022-11-06 DIAGNOSIS — T8389XA Other specified complication of genitourinary prosthetic devices, implants and grafts, initial encounter: Secondary | ICD-10-CM | POA: Diagnosis not present

## 2022-11-06 NOTE — Progress Notes (Signed)
Patient in office today complaining of foley catheter discomfort. Foley catheter noted to be patent and intact. Skin around head of penis noted to be dry and cracked with possible penile erosion.  NP made aware and recommended starting with Bacitracin ointment to affected area bid. Patient is to call office if area does not improve. Patient made aware and voiced understanding.

## 2022-11-08 ENCOUNTER — Other Ambulatory Visit: Payer: Self-pay | Admitting: Cardiovascular Disease

## 2022-11-08 ENCOUNTER — Other Ambulatory Visit: Payer: Self-pay | Admitting: Internal Medicine

## 2022-11-08 DIAGNOSIS — E782 Mixed hyperlipidemia: Secondary | ICD-10-CM

## 2022-11-08 DIAGNOSIS — R067 Sneezing: Secondary | ICD-10-CM

## 2022-11-10 ENCOUNTER — Telehealth: Payer: Self-pay | Admitting: Urology

## 2022-11-10 ENCOUNTER — Telehealth: Payer: Self-pay

## 2022-11-10 ENCOUNTER — Ambulatory Visit (INDEPENDENT_AMBULATORY_CARE_PROVIDER_SITE_OTHER): Payer: Medicare HMO

## 2022-11-10 DIAGNOSIS — R339 Retention of urine, unspecified: Secondary | ICD-10-CM | POA: Diagnosis not present

## 2022-11-10 NOTE — Telephone Encounter (Signed)
Patient's wife called with complaints that the catheter is causing pain within his bladder.  I am bringing patient in for [place] to change his catheter but the wife is asking if the catheter can come out until his 11/26 cysto appt with you.

## 2022-11-10 NOTE — Telephone Encounter (Signed)
Patient wife called this morning he is having a lot of bladder pain, she does feel that the treatment he has had is helping , would like a call back asap

## 2022-11-10 NOTE — Progress Notes (Addendum)
Cath Change/ Replacement  Patient is present today for a catheter change due to catheter irritation.  10ml of water was removed from the balloon, a 16FR foley cath was removed without difficulty.  Patient was cleaned and prepped in a sterile fashion with betadine and 2% lidocaine jelly was instilled into the urethra. A 16 FR foley cath was replaced into the bladder, no complications were noted. Urine return was noted 20ml and urine was yellow in color. The catheter was flushed with 100cc of sterile water, with 100cc of return.  The balloon was filled with 10ml of sterile water. A leg bag was attached for drainage. Patient was given proper instruction on catheter care.    Performed by: Guss Bunde, CMA  Follow up: as scheduled with MD, patient advised to continue bacitracin cream as rx'd.

## 2022-11-13 DIAGNOSIS — N2581 Secondary hyperparathyroidism of renal origin: Secondary | ICD-10-CM | POA: Diagnosis not present

## 2022-11-13 DIAGNOSIS — D638 Anemia in other chronic diseases classified elsewhere: Secondary | ICD-10-CM | POA: Diagnosis not present

## 2022-11-13 DIAGNOSIS — N179 Acute kidney failure, unspecified: Secondary | ICD-10-CM | POA: Diagnosis not present

## 2022-11-13 DIAGNOSIS — N1832 Chronic kidney disease, stage 3b: Secondary | ICD-10-CM | POA: Diagnosis not present

## 2022-11-14 NOTE — Telephone Encounter (Signed)
Patient is aware, he will keep this follow as scheduled.  He is doing better after having catheter changed and alternating leg.

## 2022-11-21 DIAGNOSIS — E1129 Type 2 diabetes mellitus with other diabetic kidney complication: Secondary | ICD-10-CM | POA: Diagnosis not present

## 2022-11-21 DIAGNOSIS — N2581 Secondary hyperparathyroidism of renal origin: Secondary | ICD-10-CM | POA: Diagnosis not present

## 2022-11-21 DIAGNOSIS — N401 Enlarged prostate with lower urinary tract symptoms: Secondary | ICD-10-CM | POA: Diagnosis not present

## 2022-11-21 DIAGNOSIS — N1832 Chronic kidney disease, stage 3b: Secondary | ICD-10-CM | POA: Diagnosis not present

## 2022-12-04 ENCOUNTER — Encounter: Payer: Self-pay | Admitting: Internal Medicine

## 2022-12-04 ENCOUNTER — Ambulatory Visit (INDEPENDENT_AMBULATORY_CARE_PROVIDER_SITE_OTHER): Payer: Medicare HMO | Admitting: Internal Medicine

## 2022-12-04 VITALS — BP 106/56 | HR 75 | Ht 78.0 in | Wt 238.0 lb

## 2022-12-04 DIAGNOSIS — Z23 Encounter for immunization: Secondary | ICD-10-CM | POA: Diagnosis not present

## 2022-12-04 DIAGNOSIS — Z794 Long term (current) use of insulin: Secondary | ICD-10-CM | POA: Diagnosis not present

## 2022-12-04 DIAGNOSIS — N1831 Chronic kidney disease, stage 3a: Secondary | ICD-10-CM | POA: Diagnosis not present

## 2022-12-04 DIAGNOSIS — N401 Enlarged prostate with lower urinary tract symptoms: Secondary | ICD-10-CM | POA: Diagnosis not present

## 2022-12-04 DIAGNOSIS — R3914 Feeling of incomplete bladder emptying: Secondary | ICD-10-CM | POA: Diagnosis not present

## 2022-12-04 DIAGNOSIS — I6930 Unspecified sequelae of cerebral infarction: Secondary | ICD-10-CM

## 2022-12-04 DIAGNOSIS — E1159 Type 2 diabetes mellitus with other circulatory complications: Secondary | ICD-10-CM

## 2022-12-04 DIAGNOSIS — I639 Cerebral infarction, unspecified: Secondary | ICD-10-CM

## 2022-12-04 DIAGNOSIS — I4891 Unspecified atrial fibrillation: Secondary | ICD-10-CM

## 2022-12-04 NOTE — Progress Notes (Signed)
Established Patient Office Visit  Subjective   Patient ID: Dylan Tannous., male    DOB: 10-19-1940  Age: 82 y.o. MRN: [id number]  Chief Complaint  Patient presents with   Atrial Fibrillation    Follow up    Dylan Robles returns to care today for routine follow-up.  He was last evaluated by me on 8/23 for hospital follow-up after previous admission for acute CVA.  No medication changes were made at that time and 18-month follow-up was arranged.  In the interim, he has been evaluated by cardiology, endocrinology, podiatry, and urology.  Foley catheter placed 10/18 in the setting of urinary retention.  Urology follow-up is scheduled for tomorrow (11/26). Dylan Robles reports feeling well today.  He is anxious to have his Foley catheter removed but is otherwise asymptomatic and has no acute concerns to discuss.  Past Medical History:  Diagnosis Date   Acute lower UTI 09/20/2017   Bradycardia on ECG 07/31/2011   Coronary artery disease    hyperlipidemia   Decreased GFR 09/23/2019   Dehydration 09/21/2017   Diabetes mellitus    Diabetes mellitus without complication (HCC)    Hypercholesterolemia    Hypertension    Hypothyroidism    Left arm pain 01/29/2020   Paronychia of great toe of left foot 09/26/2018   Paronychia of great toe, left 09/26/2018   PERIPHERAL NEUROPATHY 01/13/2006   Qualifier: Diagnosis of  By: Jen Mow MD, Christine     Shortness of breath dyspnea    with exertion   Vasovagal syncope    Weakness 09/20/2017   Past Surgical History:  Procedure Laterality Date   APPENDECTOMY     CARDIAC CATHETERIZATION N/A 06/16/2014   Procedure: Left Heart Cath;  Surgeon: Laurier Nancy, MD;  Location: [facility];  Service: Cardiovascular;  Laterality: N/A;   CIRCUMCISION, NON-NEWBORN     HERNIA REPAIR     LOWER EXTREMITY ANGIOGRAPHY Left 12/24/2018   Procedure: LOWER EXTREMITY ANGIOGRAPHY;  Surgeon: Renford Dills, MD;  Location: [facility];  Service: Cardiovascular;   Laterality: Left;   THYROID SURGERY     Social History   Tobacco Use   Smoking status: Former   Smokeless tobacco: Never  Vaping Use   Vaping status: Never Used  Substance Use Topics   Alcohol use: No   Drug use: No   Family History  Problem Relation Age of Onset   Diabetes Father    Hypertension Father    Cancer Mother    Diabetes Brother    Allergies  Allergen Reactions   Plavix [Clopidogrel] Hives   Review of Systems  Constitutional:  Negative for chills and fever.  HENT:  Negative for sore throat.   Respiratory:  Negative for cough and shortness of breath.   Cardiovascular:  Negative for chest pain, palpitations and leg swelling.  Gastrointestinal:  Negative for abdominal pain, blood in stool, constipation, diarrhea, nausea and vomiting.  Genitourinary:  Negative for dysuria and hematuria.  Musculoskeletal:  Negative for myalgias.  Skin:  Negative for itching and rash.  Neurological:  Negative for dizziness and headaches.  Psychiatric/Behavioral:  Negative for depression and suicidal ideas.       Objective:     BP (!) 106/56   Pulse 75   Ht 6\' 6"  (1.981 m)   Wt 238 lb (108 kg)   SpO2 96%   BMI 27.50 kg/m  BP Readings from Last 3 Encounters:  12/04/22 (!) 106/56  10/27/22 (!) 156/74  10/20/22 104/72   Physical Exam Vitals reviewed.  Constitutional:      General: He is not in acute distress.    Appearance: Normal appearance. He is not ill-appearing.  HENT:     Head: Normocephalic and atraumatic.     Right Ear: External ear normal.     Left Ear: External ear normal.     Nose: Nose normal. No congestion or rhinorrhea.     Mouth/Throat:     Mouth: Mucous membranes are moist.     Pharynx: Oropharynx is clear.  Eyes:     General: No scleral icterus.    Extraocular Movements: Extraocular movements intact.     Conjunctiva/sclera: Conjunctivae normal.     Pupils: Pupils are equal, round, and reactive to light.  Cardiovascular:     Rate and Rhythm:  Normal rate and regular rhythm.     Pulses: Normal pulses.     Heart sounds: Normal heart sounds. No murmur heard. Pulmonary:     Effort: Pulmonary effort is normal.     Breath sounds: Normal breath sounds. No wheezing, rhonchi or rales.  Abdominal:     General: Abdomen is flat. Bowel sounds are normal. There is no distension.     Palpations: Abdomen is soft.     Tenderness: There is no abdominal tenderness.  Genitourinary:    Comments: Foley catheter in place draining clear yellow urine Musculoskeletal:        General: No swelling or deformity. Normal range of motion.     Cervical back: Normal range of motion.  Skin:    General: Skin is warm and dry.     Capillary Refill: Capillary refill takes less than 2 seconds.  Neurological:     General: No focal deficit present.     Mental Status: He is alert and oriented to person, place, and time.     Sensory: No sensory deficit.     Motor: No weakness.     Gait: Gait abnormal (Ambulates with walker).  Psychiatric:        Mood and Affect: Mood normal.        Behavior: Behavior normal.        Thought Content: Thought content normal.   Last CBC Lab Results  Component Value Date   WBC 7.3 08/30/2022   HGB 11.4 (L) 08/30/2022   HCT 35.1 (L) 08/30/2022   MCV 91.6 08/30/2022   MCH 29.8 08/30/2022   RDW 14.6 08/30/2022   PLT 259 08/30/2022   Last metabolic panel Lab Results  Component Value Date   GLUCOSE 138 (H) 08/30/2022   NA 134 (L) 08/30/2022   K 4.9 08/30/2022   CL 106 08/30/2022   CO2 22 08/30/2022   BUN 34 (H) 08/30/2022   CREATININE 1.71 (H) 08/30/2022   GFRNONAA 39 (L) 08/30/2022   CALCIUM 9.4 08/30/2022   PROT 6.4 (L) 08/20/2022   ALBUMIN 3.3 (L) 08/20/2022   LABGLOB 2.1 05/10/2022   AGRATIO 1.9 05/10/2022   BILITOT 0.8 08/20/2022   ALKPHOS 95 08/20/2022   AST 24 08/20/2022   ALT 28 08/20/2022   ANIONGAP 6 08/30/2022   Last lipids Lab Results  Component Value Date   CHOL 126 08/21/2022   HDL 34 (L)  08/21/2022   LDLCALC 77 08/21/2022   TRIG 74 08/21/2022   CHOLHDL 3.7 08/21/2022   Last hemoglobin A1c Lab Results  Component Value Date   HGBA1C 10.1 (H) 08/21/2022   Last thyroid functions Lab Results  Component Value Date  TSH 1.510 08/20/2022   Last vitamin D Lab Results  Component Value Date   VD25OH 28.8 (L) 05/10/2022   Last vitamin B12 and Folate Lab Results  Component Value Date   VITAMINB12 490 11/10/2021   FOLATE 14.3 11/10/2021     Assessment & Plan:   Problem List Items Addressed This Visit       Type 2 diabetes mellitus with vascular disease (HCC)    Followed by endocrinology.  A1c 10.1 in August.  Recently seen for follow-up.  Evaristo Bury was increased to 20 units nightly.  Endocrinology follow-up is scheduled for January.      New onset atrial fibrillation (HCC)    Regular rate and rhythm detected on exam today.  Evaluated by cardiology for follow-up in August.  He remains on Eliquis and Toprol-XL.  Cardiology follow-up is scheduled for next month.      Acute CVA (cerebrovascular accident) [facility])    History of CVA in August.  He remains on Eliquis and atorvastatin.  Referred to neurology upon hospital discharge but has not been seen for follow-up.  We will follow-up on the status of the nephrology referral today      Stage 3a chronic kidney disease (HCC)    Renal function stable on latest labs.  Seen by nephrology for follow-up earlier this month.      BPH (benign prostatic hyperplasia)    Foley catheter placed in the setting of BOO and LUTS.  Urology follow-up is scheduled for tomorrow for cystoscopy and TOV.  He remains on Flomax 0.4 mg twice daily.      Need for influenza vaccination    Influenza vaccine administered today      Return in about 3 months (around 03/06/2023).   Billie Lade, MD

## 2022-12-04 NOTE — Patient Instructions (Signed)
It was a pleasure to see you today.  Thank you for giving [place] the opportunity to be involved in your care.  Below is a brief recap of your visit and next steps.  We will plan to see you again in 3 months.  Summary No medication changes today Flu shot today Follow up in 3 months

## 2022-12-04 NOTE — Assessment & Plan Note (Signed)
Influenza vaccine administered today.

## 2022-12-04 NOTE — Assessment & Plan Note (Addendum)
Regular rate and rhythm detected on exam today.  Evaluated by cardiology for follow-up in August.  He remains on Eliquis and Toprol-XL.  Cardiology follow-up is scheduled for next month.

## 2022-12-04 NOTE — Progress Notes (Signed)
History of Present Illness: 82 yo male is here following initial visit w/ S. Larocco for f/u of LUTS w/ incomplete bladder emptying. He is here for cysto.  Past Medical History:  Diagnosis Date   Acute lower UTI 09/20/2017   Bradycardia on ECG 07/31/2011   Coronary artery disease    hyperlipidemia   Decreased GFR 09/23/2019   Dehydration 09/21/2017   Diabetes mellitus    Diabetes mellitus without complication (HCC)    Hypercholesterolemia    Hypertension    Hypothyroidism    Left arm pain 01/29/2020   Paronychia of great toe of left foot 09/26/2018   Paronychia of great toe, left 09/26/2018   PERIPHERAL NEUROPATHY 01/13/2006   Qualifier: Diagnosis of  By: Jen Mow MD, Christine     Shortness of breath dyspnea    with exertion   Vasovagal syncope    Weakness 09/20/2017    Past Surgical History:  Procedure Laterality Date   APPENDECTOMY     CARDIAC CATHETERIZATION N/A 06/16/2014   Procedure: Left Heart Cath;  Surgeon: Laurier Nancy, MD;  Location: [facility];  Service: Cardiovascular;  Laterality: N/A;   CIRCUMCISION, NON-NEWBORN     HERNIA REPAIR     LOWER EXTREMITY ANGIOGRAPHY Left 12/24/2018   Procedure: LOWER EXTREMITY ANGIOGRAPHY;  Surgeon: Renford Dills, MD;  Location: [facility];  Service: Cardiovascular;  Laterality: Left;   THYROID SURGERY      Home Medications:  Allergies as of 12/05/2022       Reactions   Plavix [clopidogrel] Hives        Medication List        Accurate as of December 04, 2022  2:29 PM. If you have any questions, ask your nurse or doctor.          apixaban 2.5 MG Tabs tablet Commonly known as: ELIQUIS Take 1 tablet (2.5 mg total) by mouth 2 (two) times daily.   atorvastatin 80 MG tablet Commonly known as: LIPITOR TAKE 1 TABLET EVERY DAY   cetirizine 5 MG tablet Commonly known as: ZYRTEC TAKE 1 TABLET EVERY DAY   ezetimibe 10 MG tablet Commonly known as: ZETIA TAKE 1 TABLET EVERY DAY   Fish Oil-Vitamin D  1000-1000 MG-UNIT Caps Take by mouth once. daily   FreeStyle Libre 2 Sensor Misc Use to check glucose at least 4 times daily   furosemide 20 MG tablet Commonly known as: LASIX Take 20 mg by mouth as needed.   gabapentin 300 MG capsule Commonly known as: NEURONTIN Take 1 capsule (300 mg total) by mouth 3 (three) times daily.   Gvoke HypoPen 2-Pack 1 MG/0.2ML Soaj Generic drug: Glucagon Inject 1 mg into the skin daily as needed.   isosorbide mononitrate 60 MG 24 hr tablet Commonly known as: IMDUR Take 1 tablet by mouth once daily   levothyroxine 88 MCG tablet Commonly known as: SYNTHROID TAKE 1 TABLET EVERY DAY BEFORE BREAKFAST   metoprolol succinate 50 MG 24 hr tablet Commonly known as: TOPROL-XL TAKE 1 TABLET EVERY DAY   NovoLOG FlexPen 100 UNIT/ML FlexPen Generic drug: insulin aspart Inject 10-16 Units into the skin 3 (three) times daily with meals.   spironolactone 25 MG tablet Commonly known as: Aldactone Take 1 tablet (25 mg total) by mouth daily.   tamsulosin 0.4 MG Caps capsule Commonly known as: FLOMAX Take 1 capsule (0.4 mg total) by mouth in the morning and at bedtime.   Evaristo Bury FlexTouch 100 UNIT/ML FlexTouch Pen Generic drug: insulin degludec  Inject 20 Units into the skin at bedtime.   triamcinolone cream 0.1 % Commonly known as: KENALOG Apply 1 Application topically 2 (two) times daily.        Allergies:  Allergies  Allergen Reactions   Plavix [Clopidogrel] Hives    Family History  Problem Relation Age of Onset   Diabetes Father    Hypertension Father    Cancer Mother    Diabetes Brother     Social History:  reports that he has quit smoking. He has never used smokeless tobacco. He reports that he does not drink alcohol and does not use drugs.  ROS: A complete review of systems was performed.  All systems are negative except for pertinent findings as noted.  Physical Exam:  Vital signs in last 24 hours: There were no vitals taken  for this visit. Constitutional:  Alert and oriented, No acute distress Cardiovascular: Regular rate  Respiratory: Normal respiratory effort GI: Abdomen is soft, nontender, nondistended, no abdominal masses. No CVAT.  Genitourinary: Normal male phallus, testes are descended bilaterally and non-tender and without masses, scrotum is normal in appearance without lesions or masses, perineum is normal on inspection. Lymphatic: No lymphadenopathy Neurologic: Grossly intact, no focal deficits Psychiatric: Normal mood and affect  I have reviewed prior pt notes  I have reviewed notes from referring/previous physicians  I have reviewed urinalysis results  I have independently reviewed prior imaging  I have reviewed prior PSA results  I have reviewed prior urine culture   Impression/Assessment:  ***  Plan:  ***

## 2022-12-04 NOTE — Assessment & Plan Note (Signed)
Foley catheter placed in the setting of BOO and LUTS.  Urology follow-up is scheduled for tomorrow for cystoscopy and TOV.  He remains on Flomax 0.4 mg twice daily.

## 2022-12-04 NOTE — Assessment & Plan Note (Signed)
History of CVA in August.  He remains on Eliquis and atorvastatin.  Referred to neurology upon hospital discharge but has not been seen for follow-up.  We will follow-up on the status of the nephrology referral today

## 2022-12-04 NOTE — Assessment & Plan Note (Signed)
Renal function stable on latest labs.  Seen by nephrology for follow-up earlier this month.

## 2022-12-04 NOTE — Assessment & Plan Note (Signed)
Followed by endocrinology.  A1c 10.1 in August.  Recently seen for follow-up.  Dylan Robles was increased to 20 units nightly.  Endocrinology follow-up is scheduled for January.

## 2022-12-05 ENCOUNTER — Ambulatory Visit: Payer: Medicare HMO | Admitting: Urology

## 2022-12-05 ENCOUNTER — Other Ambulatory Visit: Payer: Self-pay | Admitting: Obstetrics and Gynecology

## 2022-12-05 ENCOUNTER — Encounter: Payer: Self-pay | Admitting: Urology

## 2022-12-05 VITALS — BP 155/65 | HR 72

## 2022-12-05 DIAGNOSIS — N138 Other obstructive and reflux uropathy: Secondary | ICD-10-CM | POA: Diagnosis not present

## 2022-12-05 DIAGNOSIS — N401 Enlarged prostate with lower urinary tract symptoms: Secondary | ICD-10-CM

## 2022-12-05 DIAGNOSIS — N3289 Other specified disorders of bladder: Secondary | ICD-10-CM

## 2022-12-05 DIAGNOSIS — R339 Retention of urine, unspecified: Secondary | ICD-10-CM | POA: Diagnosis not present

## 2022-12-05 MED ORDER — CIPROFLOXACIN HCL 500 MG PO TABS
500.0000 mg | ORAL_TABLET | Freq: Once | ORAL | Status: AC
  Administered 2022-12-05: 500 mg via ORAL

## 2022-12-05 NOTE — Progress Notes (Signed)
Catheter Removal  Patient is present today for a catheter removal.  10ml of water was drained from the balloon. A 16FR foley cath was removed from the bladder, no complications were noted. Patient tolerated well.  Performed by: Gwendolyn Grant CMA & Hope LPN  Follow up/ Additional notes:  Cystoscopy with MD next  Simple Catheter Placement  Due to urinary retention patient is present today for a foley cath placement.  Patient was cleaned and prepped in a sterile fashion with betadine and 2% lidocaine jelly was instilled into the urethra. A 16 FR foley catheter was inserted, urine return was noted  , urine was yellow in color.  The balloon was filled with 10cc of sterile water.  A bed bag bag was attached for drainage. Patient was also given a leg bag to take home and was given instruction on how to change from one bag to another.  Patient was given instruction on proper catheter care.  Patient tolerated well, no complications were noted   Performed by: Gwendolyn Grant CMA & Kourtney CMA  Additional notes/ Follow up: Follow up per MD instruction

## 2022-12-06 ENCOUNTER — Telehealth: Payer: Self-pay

## 2022-12-06 DIAGNOSIS — N401 Enlarged prostate with lower urinary tract symptoms: Secondary | ICD-10-CM

## 2022-12-06 NOTE — Telephone Encounter (Signed)
Already discussed with surgery scheduler.

## 2022-12-06 NOTE — Telephone Encounter (Signed)
-----   Message from Bertram Millard Dahlstedt sent at 12/05/2022 12:18 PM EST ----- This man needs a TURP scheduled.  I will give you a posting sheet.  I sent Dr. Ronne Binning a message about him.

## 2022-12-06 NOTE — Telephone Encounter (Signed)
Patient left a voice message 12-06-2022.  Returned missed call regarding surgery.  Please return call at [phone].

## 2022-12-06 NOTE — Telephone Encounter (Signed)
Yes, noted.

## 2022-12-06 NOTE — Telephone Encounter (Signed)
Surgery request added to workque.

## 2022-12-08 ENCOUNTER — Other Ambulatory Visit: Payer: Self-pay | Admitting: Internal Medicine

## 2022-12-08 DIAGNOSIS — I10 Essential (primary) hypertension: Secondary | ICD-10-CM

## 2022-12-18 ENCOUNTER — Encounter: Payer: Self-pay | Admitting: Cardiovascular Disease

## 2022-12-18 ENCOUNTER — Ambulatory Visit (INDEPENDENT_AMBULATORY_CARE_PROVIDER_SITE_OTHER): Payer: Medicare HMO | Admitting: Cardiovascular Disease

## 2022-12-18 VITALS — BP 108/60 | HR 49 | Ht 78.0 in | Wt 238.0 lb

## 2022-12-18 DIAGNOSIS — I25118 Atherosclerotic heart disease of native coronary artery with other forms of angina pectoris: Secondary | ICD-10-CM | POA: Diagnosis not present

## 2022-12-18 DIAGNOSIS — I4891 Unspecified atrial fibrillation: Secondary | ICD-10-CM | POA: Diagnosis not present

## 2022-12-18 DIAGNOSIS — Z8679 Personal history of other diseases of the circulatory system: Secondary | ICD-10-CM

## 2022-12-18 DIAGNOSIS — E782 Mixed hyperlipidemia: Secondary | ICD-10-CM

## 2022-12-18 DIAGNOSIS — I5032 Chronic diastolic (congestive) heart failure: Secondary | ICD-10-CM | POA: Diagnosis not present

## 2022-12-18 MED ORDER — METOPROLOL SUCCINATE ER 50 MG PO TB24: 50.0000 mg | ORAL_TABLET | Freq: Every day | ORAL | 3 refills | Status: DC

## 2022-12-18 NOTE — Progress Notes (Signed)
Cardiology Office Note   Date:  12/18/2022   ID:  Dylan Robles., DOB [birth year], MRN [id number]  PCP:  Billie Lade, MD  Cardiologist:  Adrian Blackwater, MD      History of Present Illness: Dylan Robles. is a 82 y.o. male who presents for  Chief Complaint  Patient presents with   Follow-up    3 month follow up    Feels fine      Past Medical History:  Diagnosis Date   Acute lower UTI 09/20/2017   Bradycardia on ECG 07/31/2011   Coronary artery disease    hyperlipidemia   Decreased GFR 09/23/2019   Dehydration 09/21/2017   Diabetes mellitus    Diabetes mellitus without complication (HCC)    Hypercholesterolemia    Hypertension    Hypothyroidism    Left arm pain 01/29/2020   Paronychia of great toe of left foot 09/26/2018   Paronychia of great toe, left 09/26/2018   PERIPHERAL NEUROPATHY 01/13/2006   Qualifier: Diagnosis of  By: Jen Mow MD, Christine     Shortness of breath dyspnea    with exertion   Vasovagal syncope    Weakness 09/20/2017     Past Surgical History:  Procedure Laterality Date   APPENDECTOMY     CARDIAC CATHETERIZATION N/A 06/16/2014   Procedure: Left Heart Cath;  Surgeon: Laurier Nancy, MD;  Location: [facility];  Service: Cardiovascular;  Laterality: N/A;   CIRCUMCISION, NON-NEWBORN     HERNIA REPAIR     LOWER EXTREMITY ANGIOGRAPHY Left 12/24/2018   Procedure: LOWER EXTREMITY ANGIOGRAPHY;  Surgeon: Renford Dills, MD;  Location: [facility];  Service: Cardiovascular;  Laterality: Left;   THYROID SURGERY       Current Outpatient Medications  Medication Sig Dispense Refill   apixaban (ELIQUIS) 2.5 MG TABS tablet Take 1 tablet (2.5 mg total) by mouth 2 (two) times daily. 60 tablet 3   atorvastatin (LIPITOR) 80 MG tablet TAKE 1 TABLET EVERY DAY 90 tablet 3   cetirizine (ZYRTEC) 5 MG tablet TAKE 1 TABLET EVERY DAY 90 tablet 3   Continuous Blood Gluc Sensor (FREESTYLE LIBRE 2 SENSOR) MISC Use to check  glucose at least 4 times daily 6 each 3   ezetimibe (ZETIA) 10 MG tablet TAKE 1 TABLET EVERY DAY 90 tablet 3   Fish Oil-Vitamin D 1000-1000 MG-UNIT CAPS Take by mouth once. daily     furosemide (LASIX) 20 MG tablet Take 20 mg by mouth as needed.     gabapentin (NEURONTIN) 300 MG capsule Take 1 capsule (300 mg total) by mouth 3 (three) times daily. 270 capsule 1   insulin degludec (TRESIBA FLEXTOUCH) 100 UNIT/ML FlexTouch Pen Inject 20 Units into the skin at bedtime. 18 mL 3   isosorbide mononitrate (IMDUR) 60 MG 24 hr tablet Take 1 tablet by mouth once daily 30 tablet 0   levothyroxine (SYNTHROID) 88 MCG tablet TAKE 1 TABLET EVERY DAY BEFORE BREAKFAST 90 tablet 3   metoprolol succinate (TOPROL XL) 25 MG 24 hr tablet Take 1 tablet (25 mg total) by mouth daily. 30 tablet 11   spironolactone (ALDACTONE) 25 MG tablet Take 1 tablet (25 mg total) by mouth daily. 30 tablet 11   tamsulosin (FLOMAX) 0.4 MG CAPS capsule Take 1 capsule (0.4 mg total) by mouth in the morning and at bedtime. 180 capsule 3   Glucagon (GVOKE HYPOPEN 2-PACK) 1 MG/0.2ML SOAJ Inject 1 mg into the skin daily as needed. (  Patient not taking: Reported on 12/18/2022) 0.4 mL 1   insulin aspart (NOVOLOG FLEXPEN) 100 UNIT/ML FlexPen Inject 10-16 Units into the skin 3 (three) times daily with meals. (Patient not taking: Reported on 12/18/2022) 30 mL 3   triamcinolone cream (KENALOG) 0.1 % Apply 1 Application topically 2 (two) times daily. (Patient not taking: Reported on 12/18/2022) 15 g 1   No current facility-administered medications for this visit.    Allergies:   Plavix [clopidogrel]    Social History:   reports that he has quit smoking. He has never used smokeless tobacco. He reports that he does not drink alcohol and does not use drugs.   Family History:  family history includes Cancer in his mother; Diabetes in his brother and father; Hypertension in his father.    ROS:     Review of Systems  Constitutional: Negative.   HENT:  Negative.    Eyes: Negative.   Respiratory: Negative.    Gastrointestinal: Negative.   Genitourinary: Negative.   Musculoskeletal: Negative.   Skin: Negative.   Neurological: Negative.   Endo/Heme/Allergies: Negative.   Psychiatric/Behavioral: Negative.    All other systems reviewed and are negative.     All other systems are reviewed and negative.    PHYSICAL EXAM: VS:  BP 108/60   Pulse (!) 49   Ht 6\' 6"  (1.981 m)   Wt 238 lb (108 kg)   SpO2 97%   BMI 27.50 kg/m  , BMI Body mass index is 27.5 kg/m. Last weight:  Wt Readings from Last 3 Encounters:  12/18/22 238 lb (108 kg)  12/04/22 238 lb (108 kg)  10/27/22 233 lb (105.7 kg)     Physical Exam Vitals reviewed.  Constitutional:      Appearance: Normal appearance. He is normal weight.  HENT:     Head: Normocephalic.     Nose: Nose normal.     Mouth/Throat:     Mouth: Mucous membranes are moist.  Eyes:     Pupils: Pupils are equal, round, and reactive to light.  Cardiovascular:     Rate and Rhythm: Normal rate and regular rhythm.     Pulses: Normal pulses.     Heart sounds: Normal heart sounds.  Pulmonary:     Effort: Pulmonary effort is normal.  Abdominal:     General: Abdomen is flat. Bowel sounds are normal.  Musculoskeletal:        General: Normal range of motion.     Cervical back: Normal range of motion.  Skin:    General: Skin is warm.  Neurological:     General: No focal deficit present.     Mental Status: He is alert.  Psychiatric:        Mood and Affect: Mood normal.       EKG:   Recent Labs: 08/20/2022: ALT 28; B Natriuretic Peptide 439.8; TSH 1.510 08/30/2022: BUN 34; Creatinine, Ser 1.71; Hemoglobin 11.4; Platelets 259; Potassium 4.9; Sodium 134    Lipid Panel    Component Value Date/Time   CHOL 126 08/21/2022 0502   CHOL 125 05/10/2022 0917   TRIG 74 08/21/2022 0502   HDL 34 (L) 08/21/2022 0502   HDL 46 05/10/2022 0917   CHOLHDL 3.7 08/21/2022 0502   VLDL 15 08/21/2022 0502    LDLCALC 77 08/21/2022 0502   LDLCALC 65 05/10/2022 0917   LDLCALC 73 11/03/2020 1032      Other studies Reviewed: Additional studies/ records that were reviewed today include:  Review of the  above records demonstrates:       No data to display            ASSESSMENT AND PLAN:    ICD-10-CM   1. Chronic diastolic CHF (congestive heart failure) (HCC)  I50.32 metoprolol succinate (TOPROL XL) 25 MG 24 hr tablet    2. Coronary artery disease of native artery of native heart with stable angina pectoris (HCC)  I25.118 metoprolol succinate (TOPROL XL) 25 MG 24 hr tablet   stress test normal    3. New onset atrial fibrillation (HCC)  I48.91 metoprolol succinate (TOPROL XL) 25 MG 24 hr tablet    4. Mixed hyperlipidemia  E78.2 metoprolol succinate (TOPROL XL) 25 MG 24 hr tablet    5. Atrial fibrillation, currently in sinus rhythm  Z86.79 metoprolol succinate (TOPROL XL) 25 MG 24 hr tablet   HR low so decrease metoprolol to  25 daily       Problem List Items Addressed This Visit       Cardiovascular and Mediastinum   Coronary artery disease of native artery of native heart with stable angina pectoris (HCC)   Relevant Medications   metoprolol succinate (TOPROL XL) 25 MG 24 hr tablet   Chronic diastolic CHF (congestive heart failure) (HCC) - Primary   Relevant Medications   metoprolol succinate (TOPROL XL) 25 MG 24 hr tablet   New onset atrial fibrillation (HCC)   Relevant Medications   metoprolol succinate (TOPROL XL) 25 MG 24 hr tablet     Other   Hyperlipidemia   Relevant Medications   metoprolol succinate (TOPROL XL) 25 MG 24 hr tablet   Other Visit Diagnoses     Atrial fibrillation, currently in sinus rhythm       HR low so decrease metoprolol to  25 daily   Relevant Medications   metoprolol succinate (TOPROL XL) 25 MG 24 hr tablet          Disposition:   Return in about 3 months (around 03/18/2023).    Total time spent: 30 minutes  Signed,  Adrian Blackwater, MD  12/18/2022 9:18 AM    [facility]

## 2022-12-20 ENCOUNTER — Other Ambulatory Visit: Payer: Self-pay

## 2022-12-20 MED ORDER — APIXABAN 2.5 MG PO TABS: 2.5000 mg | ORAL_TABLET | Freq: Two times a day (BID) | ORAL | 3 refills | Status: DC

## 2023-01-12 ENCOUNTER — Other Ambulatory Visit: Payer: Self-pay

## 2023-01-12 ENCOUNTER — Other Ambulatory Visit (HOSPITAL_COMMUNITY): Payer: Self-pay

## 2023-01-12 MED ORDER — APIXABAN 2.5 MG PO TABS
2.5000 mg | ORAL_TABLET | Freq: Two times a day (BID) | ORAL | 3 refills | Status: DC
  Filled 2023-01-23 – 2023-02-14 (×2): qty 60, 30d supply, fill #0
  Filled 2023-03-12: qty 60, 30d supply, fill #1
  Filled 2023-04-11: qty 60, 30d supply, fill #2

## 2023-01-13 ENCOUNTER — Other Ambulatory Visit (HOSPITAL_COMMUNITY): Payer: Self-pay

## 2023-01-17 ENCOUNTER — Other Ambulatory Visit (HOSPITAL_COMMUNITY): Payer: Self-pay

## 2023-01-17 MED ORDER — ISOSORBIDE MONONITRATE ER 30 MG PO TB24
30.0000 mg | ORAL_TABLET | Freq: Every day | ORAL | 3 refills | Status: DC
  Filled 2023-01-17 – 2023-02-27 (×2): qty 90, 90d supply, fill #0

## 2023-01-17 MED FILL — Ezetimibe Tab 10 MG: ORAL | 90 days supply | Qty: 90 | Fill #0 | Status: CN

## 2023-01-17 MED FILL — Atorvastatin Calcium Tab 80 MG (Base Equivalent): ORAL | 90 days supply | Qty: 90 | Fill #0 | Status: CN

## 2023-01-17 MED FILL — Cetirizine HCl Tab 5 MG: ORAL | 90 days supply | Qty: 90 | Fill #0 | Status: CN

## 2023-01-18 ENCOUNTER — Other Ambulatory Visit (HOSPITAL_COMMUNITY): Payer: Self-pay

## 2023-01-18 DIAGNOSIS — E1165 Type 2 diabetes mellitus with hyperglycemia: Secondary | ICD-10-CM | POA: Diagnosis not present

## 2023-01-22 ENCOUNTER — Other Ambulatory Visit (HOSPITAL_COMMUNITY): Payer: Self-pay

## 2023-01-22 ENCOUNTER — Encounter: Payer: Self-pay | Admitting: Podiatry

## 2023-01-22 ENCOUNTER — Ambulatory Visit: Payer: PPO | Admitting: Podiatry

## 2023-01-22 VITALS — Ht 78.0 in | Wt 238.0 lb

## 2023-01-22 DIAGNOSIS — M79674 Pain in right toe(s): Secondary | ICD-10-CM | POA: Diagnosis not present

## 2023-01-22 DIAGNOSIS — I739 Peripheral vascular disease, unspecified: Secondary | ICD-10-CM

## 2023-01-22 DIAGNOSIS — M79675 Pain in left toe(s): Secondary | ICD-10-CM | POA: Diagnosis not present

## 2023-01-22 DIAGNOSIS — B351 Tinea unguium: Secondary | ICD-10-CM | POA: Diagnosis not present

## 2023-01-22 DIAGNOSIS — E1142 Type 2 diabetes mellitus with diabetic polyneuropathy: Secondary | ICD-10-CM

## 2023-01-22 NOTE — Progress Notes (Signed)
  Subjective:  Patient ID: Dylan Rosina Glendia Mickey., male    DOB: Apr 02, 1940,  MRN: [id number]  Dylan Rosina Glendia Mickey. presents to clinic today for at risk footcare. Patient has h/o diabetes, neuropathy and PAD and is seen for  and painful elongated mycotic toenails 1-5 bilaterally which are tender when wearing enclosed shoe gear. Pain is relieved with periodic professional debridement. He is accompanied by his wife on today's visit. Chief Complaint  Patient presents with   Nail Problem    Pt is here for [facility] last A1C was 8 PCP is Dr Melvenia and LOV was 3 months ago.   New problem(s): None.   PCP is Melvenia Manus BRAVO, MD.  Allergies  Allergen Reactions   Plavix [Clopidogrel] Hives    Review of Systems: Negative except as noted in the HPI.  Objective: No changes noted in today's physical examination. There were no vitals filed for this visit. Dylan Rosina Glendia Mickey. is a pleasant 83 y.o. male WD, WN in NAD. AAO x 3.  Vascular Examination: CFT <4 seconds b/l. DP pulses diminished b/l. PT pulses diminished b/l. Digital hair absent. Skin temperature gradient warm to cool b/l. No ischemia or gangrene. No cyanosis or clubbing noted b/l. Trace edema noted left foot. +1 pitting edema right foot.   Neurological Examination: Protective sensation diminished with 10g monofilament b/l.  Dermatological Examination: Pedal skin thin, shiny and atrophic b/l. No open wounds. No interdigital macerations.   Toenails 1-5 b/l thick, discolored, elongated with subungual debris and pain on dorsal palpation.   No corns, calluses nor porokeratotic lesions noted.  Musculoskeletal Examination: Muscle strength 5/5 to all lower extremity muscle groups bilaterally. Hammertoe(s) noted to the L 2nd toe, L 3rd toe, L 4th toe, R 2nd toe, R 3rd toe, and R 4th toe. Utilizes walker for ambulation assistance.  Radiographs: None  Last A1c:      Latest Ref Rng & Units 08/21/2022    5:02 AM 04/20/2022   11:46 AM   Hemoglobin A1C  Hemoglobin-A1c 4.8 - 5.6 % 10.1  10.1    Assessment/Plan: 1. Pain due to onychomycosis of toenails of both feet   2. PAD (peripheral artery disease) (HCC)   3. Diabetic polyneuropathy associated with type 2 diabetes mellitus (HCC)     -Patient's family member present. All questions/concerns addressed on today's visit. -Continue foot and shoe inspections daily. Monitor blood glucose per PCP/Endocrinologist's recommendations. -Mycotic toenails 1-5 bilaterally were debrided in length and girth with sterile nail nippers and dremel without incident. -Patient/POA to call should there be question/concern in the interim.   Return in about 3 months (around 04/22/2023).  Delon LITTIE Merlin, DPM      [facility] LOCATION: [address], [place] [address]                   Office [phone]   [facility] LOCATION: [address] [place], [place] [address] Office [phone]

## 2023-01-23 ENCOUNTER — Other Ambulatory Visit (HOSPITAL_COMMUNITY): Payer: Self-pay

## 2023-01-23 ENCOUNTER — Other Ambulatory Visit: Payer: Self-pay

## 2023-01-23 MED ORDER — LISINOPRIL 20 MG PO TABS
20.0000 mg | ORAL_TABLET | Freq: Every day | ORAL | 3 refills | Status: DC
  Filled 2023-01-23: qty 90, 90d supply, fill #0

## 2023-01-24 ENCOUNTER — Other Ambulatory Visit (HOSPITAL_COMMUNITY): Payer: Self-pay

## 2023-01-24 ENCOUNTER — Other Ambulatory Visit: Payer: Self-pay

## 2023-01-25 ENCOUNTER — Telehealth: Payer: Self-pay

## 2023-01-25 ENCOUNTER — Other Ambulatory Visit (HOSPITAL_COMMUNITY): Payer: Self-pay

## 2023-01-25 MED ORDER — METOPROLOL SUCCINATE ER 25 MG PO TB24
25.0000 mg | ORAL_TABLET | Freq: Every day | ORAL | 11 refills | Status: DC
  Filled 2023-02-01 – 2023-02-05 (×2): qty 30, 30d supply, fill #0
  Filled 2023-03-03: qty 30, 30d supply, fill #1
  Filled 2023-04-02: qty 100, 100d supply, fill #2
  Filled 2023-07-06: qty 100, 100d supply, fill #3
  Filled 2023-10-11: qty 100, 100d supply, fill #4

## 2023-01-25 MED ORDER — INSULIN ASPART 100 UNIT/ML FLEXPEN: 10.0000 [IU] | PEN_INJECTOR | Freq: Three times a day (TID) | SUBCUTANEOUS | 3 refills | Status: DC

## 2023-01-25 MED ORDER — INSULIN ASPART 100 UNIT/ML FLEXPEN: 8.0000 [IU] | PEN_INJECTOR | Freq: Three times a day (TID) | SUBCUTANEOUS | 3 refills | Status: DC

## 2023-01-25 MED ORDER — INSULIN DEGLUDEC 100 UNIT/ML ~~LOC~~ SOPN: 16.0000 [IU] | PEN_INJECTOR | Freq: Every day | SUBCUTANEOUS | 3 refills | Status: DC

## 2023-01-25 MED ORDER — TRIAMCINOLONE ACETONIDE 0.1 % EX CREA: 1.0000 | TOPICAL_CREAM | Freq: Two times a day (BID) | CUTANEOUS | 1 refills | Status: DC

## 2023-01-25 NOTE — Progress Notes (Signed)
Care Guide Pharmacy Note  01/25/2023 Name: Johanan Saucer. MRN: [id number] DOB: [birth year]  Referred By: Billie Lade, MD Reason for referral: Care Coordination ([facility] Diabetes.)   Chelsea Primus Verdun Ackermann. is a 83 y.o. year old male who is a primary care patient of Durwin Nora, Lucina Mellow, MD.  [name]. was referred to the pharmacist for assistance related to: DMII  Successful contact was made with the patient to discuss pharmacy services.  Patient declines engagement at this time. Contact information was provided to the patient should they wish to reach out for assistance at a later time.  [facility]  [facility], [facility] Health Care Management Assistant Direct Dial: [phone]  Fax: [phone]

## 2023-01-26 ENCOUNTER — Ambulatory Visit: Payer: PPO | Admitting: Nurse Practitioner

## 2023-01-26 ENCOUNTER — Encounter: Payer: Self-pay | Admitting: Nurse Practitioner

## 2023-01-26 ENCOUNTER — Other Ambulatory Visit (HOSPITAL_COMMUNITY): Payer: Self-pay

## 2023-01-26 ENCOUNTER — Other Ambulatory Visit: Payer: Self-pay

## 2023-01-26 VITALS — BP 144/73 | HR 76 | Ht 78.0 in | Wt 243.4 lb

## 2023-01-26 DIAGNOSIS — E039 Hypothyroidism, unspecified: Secondary | ICD-10-CM | POA: Diagnosis not present

## 2023-01-26 DIAGNOSIS — N1832 Chronic kidney disease, stage 3b: Secondary | ICD-10-CM

## 2023-01-26 DIAGNOSIS — E1122 Type 2 diabetes mellitus with diabetic chronic kidney disease: Secondary | ICD-10-CM | POA: Diagnosis not present

## 2023-01-26 DIAGNOSIS — Z794 Long term (current) use of insulin: Secondary | ICD-10-CM | POA: Diagnosis not present

## 2023-01-26 LAB — POCT GLYCOSYLATED HEMOGLOBIN (HGB A1C): Hemoglobin A1C: 10.2 % — AB (ref 4.0–5.6)

## 2023-01-26 MED ORDER — INSULIN ASPART 100 UNIT/ML FLEXPEN
10.0000 [IU] | PEN_INJECTOR | Freq: Three times a day (TID) | SUBCUTANEOUS | 3 refills | Status: DC
  Filled 2023-01-26 – 2023-02-01 (×2): qty 30, 62d supply, fill #0
  Filled 2023-04-05: qty 30, 62d supply, fill #1
  Filled 2023-06-04: qty 30, 62d supply, fill #2

## 2023-01-26 MED ORDER — FREESTYLE LIBRE 3 READER DEVI: 1.0000 | Freq: Once | 0 refills | Status: AC

## 2023-01-26 MED ORDER — LEVOTHYROXINE SODIUM 88 MCG PO TABS
88.0000 ug | ORAL_TABLET | Freq: Every day | ORAL | 3 refills | Status: DC
  Filled 2023-01-26: qty 90, 90d supply, fill #0
  Filled 2023-04-21: qty 30, 30d supply, fill #1
  Filled 2023-05-21: qty 30, 30d supply, fill #2
  Filled 2023-06-20: qty 30, 30d supply, fill #3
  Filled 2023-07-23: qty 30, 30d supply, fill #4
  Filled 2023-08-20: qty 30, 30d supply, fill #5
  Filled 2023-09-16: qty 30, 30d supply, fill #6
  Filled 2023-10-16: qty 30, 30d supply, fill #7

## 2023-01-26 MED ORDER — FREESTYLE LIBRE 3 PLUS SENSOR MISC: 3 refills | Status: DC

## 2023-01-26 MED ORDER — TRESIBA FLEXTOUCH 100 UNIT/ML ~~LOC~~ SOPN
20.0000 [IU] | PEN_INJECTOR | Freq: Every day | SUBCUTANEOUS | 3 refills | Status: DC
  Filled 2023-01-26 – 2023-02-01 (×2): qty 18, 90d supply, fill #0
  Filled 2023-03-15: qty 18, 90d supply, fill #1

## 2023-01-26 NOTE — Progress Notes (Signed)
Endocrinology Follow Up Note       01/26/2023, 10:59 AM   Subjective:    Patient ID: Dylan Snell., male    DOB: 12-12-1940.  Dylan Snell. is being seen in follow up after being seen in consultation for management of currently uncontrolled symptomatic diabetes requested by  Billie Lade, Dylan Robles.   Past Medical History:  Diagnosis Date   Acute lower UTI 09/20/2017   Bradycardia on ECG 07/31/2011   Coronary artery disease    hyperlipidemia   Decreased GFR 09/23/2019   Dehydration 09/21/2017   Diabetes mellitus    Diabetes mellitus without complication (HCC)    Hypercholesterolemia    Hypertension    Hypothyroidism    Left arm pain 01/29/2020   Paronychia of great toe of left foot 09/26/2018   Paronychia of great toe, left 09/26/2018   PERIPHERAL NEUROPATHY 01/13/2006   Qualifier: Diagnosis of  By: Jen Mow Dylan Robles, Christine     Shortness of breath dyspnea    with exertion   Vasovagal syncope    Weakness 09/20/2017    Past Surgical History:  Procedure Laterality Date   APPENDECTOMY     CARDIAC CATHETERIZATION N/A 06/16/2014   Procedure: Left Heart Cath;  Surgeon: Laurier Nancy, Dylan Robles;  Location: [facility];  Service: Cardiovascular;  Laterality: N/A;   CIRCUMCISION, NON-NEWBORN     HERNIA REPAIR     LOWER EXTREMITY ANGIOGRAPHY Left 12/24/2018   Procedure: LOWER EXTREMITY ANGIOGRAPHY;  Surgeon: Renford Dills, Dylan Robles;  Location: [facility];  Service: Cardiovascular;  Laterality: Left;   THYROID SURGERY      Social History   Socioeconomic History   Marital status: Married    Spouse name: Not on file   Number of children: Not on file   Years of education: Not on file   Highest education level: Not on file  Occupational History   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  Tobacco Use   Smoking status: Former   Smokeless tobacco: Never  Advertising account planner   Vaping status: Never  Used  Substance and Sexual Activity   Alcohol use: No   Drug use: No   Sexual activity: Not on file  Other Topics Concern   Not on file  Social History Narrative   ** Merged History Encounter **       Social Drivers of Health   Financial Resource Strain: Low Risk  (03/29/2021)   Overall Financial Resource Strain (CARDIA)    Difficulty of Paying Living Expenses: Not very hard  Food Insecurity: No Food Insecurity (08/25/2022)   Hunger Vital Sign    Worried About Running Out of Food in the Last Year: Never true    Ran Out of Food in the Last Year: Never true  Transportation Needs: No Transportation Needs (08/25/2022)   PRAPARE - Administrator, Civil Service (Medical): No    Lack of Transportation (Non-Medical): No  Physical Activity: Insufficiently Active (03/29/2021)   Exercise Vital Sign    Days of Exercise per Week: 7 days    Minutes of Exercise per Session: 20 min  Stress: No Stress Concern Present (03/29/2021)  [employer] of Occupational Health - Occupational Stress Questionnaire    Feeling of Stress : Not at all  Social Connections: Socially Integrated (03/29/2021)   Social Connection and Isolation Panel [NHANES]    Frequency of Communication with Friends and Family: More than three times a week    Frequency of Social Gatherings with Friends and Family: More than three times a week    Attends Religious Services: More than 4 times per year    Active Member of [employer] or Organizations: Yes    Attends Banker Meetings: 1 to 4 times per year    Marital Status: Married    Family History  Problem Relation Age of Onset   Diabetes Father    Hypertension Father    Cancer Mother    Diabetes Brother     Outpatient Encounter Medications as of 01/26/2023  Medication Sig   apixaban (ELIQUIS) 2.5 MG TABS tablet Take 1 tablet (2.5 mg total) by mouth 2 (two) times daily.   atorvastatin (LIPITOR) 80 MG tablet Take 1 tablet (80 mg total) by mouth daily.    cetirizine (ZYRTEC) 5 MG tablet Take 1 tablet (5 mg total) by mouth daily.   Continuous Blood Gluc Sensor (FREESTYLE LIBRE 2 SENSOR) MISC Use to check glucose at least 4 times daily   Continuous Glucose Receiver (FREESTYLE LIBRE 3 READER) DEVI 1 Device by Does not apply route once for 1 dose.   Continuous Glucose Sensor (FREESTYLE LIBRE 3 PLUS SENSOR) MISC Change sensor every 15 days.   ezetimibe (ZETIA) 10 MG tablet Take 1 tablet (10 mg total) by mouth daily.   Fish Oil-Vitamin D 1000-1000 MG-UNIT CAPS Take 1 capsule by mouth daily.   furosemide (LASIX) 20 MG tablet Take 20 mg by mouth daily as needed for edema or fluid.   gabapentin (NEURONTIN) 300 MG capsule Take 1 capsule (300 mg total) by mouth 3 (three) times daily.   isosorbide mononitrate (IMDUR) 60 MG 24 hr tablet Take 1 tablet by mouth once daily   metoprolol succinate (TOPROL-XL) 25 MG 24 hr tablet Take 1 tablet (25 mg total) by mouth daily.   spironolactone (ALDACTONE) 25 MG tablet Take 1 tablet (25 mg total) by mouth daily.   tamsulosin (FLOMAX) 0.4 MG CAPS capsule Take 1 capsule (0.4 mg total) by mouth in the morning and at bedtime.   triamcinolone cream (KENALOG) 0.1 % apply cream externally to affected area twice dailiy.   [DISCONTINUED] insulin aspart (NOVOLOG FLEXPEN) 100 UNIT/ML FlexPen Inject 10-16 Units into the skin 3 (three) times daily with meals.   [DISCONTINUED] insulin aspart (NOVOLOG) 100 UNIT/ML FlexPen Inject 10-16 Units into the skin 3 (three) times daily with meals.   [DISCONTINUED] insulin degludec (TRESIBA FLEXTOUCH) 100 UNIT/ML FlexTouch Pen Inject 20 Units into the skin at bedtime.   [DISCONTINUED] insulin degludec (TRESIBA) 100 UNIT/ML FlexTouch Pen Inject 16 Units into the skin at bedtime.   [DISCONTINUED] levothyroxine (SYNTHROID) 88 MCG tablet TAKE 1 TABLET EVERY DAY BEFORE BREAKFAST   insulin aspart (NOVOLOG) 100 UNIT/ML FlexPen Inject 10-16 Units into the skin 3 (three) times daily with meals.   insulin  degludec (TRESIBA FLEXTOUCH) 100 UNIT/ML FlexTouch Pen Inject 20 Units into the skin at bedtime.   isosorbide mononitrate (IMDUR) 30 MG 24 hr tablet Take 1 tablet (30 mg total) by mouth daily. (Patient not taking: Reported on 01/24/2023)   levothyroxine (SYNTHROID) 88 MCG tablet Take 1 tablet (88 mcg total) by mouth daily before breakfast.   metoprolol succinate (TOPROL  XL) 50 MG 24 hr tablet Take 1 tablet (50 mg total) by mouth daily. (Patient not taking: Reported on 01/26/2023)   [DISCONTINUED] insulin aspart (NOVOLOG) 100 UNIT/ML FlexPen Inject 8-14 Units into the skin 3 (three) times daily with meals. (Patient not taking: Reported on 01/26/2023)   [DISCONTINUED] lisinopril (ZESTRIL) 20 MG tablet Take 1 tablet (20 mg total) by mouth daily.   No facility-administered encounter medications on file as of 01/26/2023.    ALLERGIES: Allergies  Allergen Reactions   Plavix [Clopidogrel] Hives    VACCINATION STATUS: Immunization History  Administered Date(s) Administered   Fluad Quad(high Dose 65+) 09/22/2019, 11/03/2020, 11/10/2021   Fluad Trivalent(High Dose 65+) 12/04/2022   Influenza Whole 10/05/2005, 12/17/2006   Influenza,inj,quad, With Preservative 11/25/2018   Moderna Sars-Covid-2 Vaccination 03/06/2019, 04/03/2019, 01/06/2020   Pneumococcal Conjugate-13 01/29/2020   Pneumococcal Polysaccharide-23 08/21/2005   Td 12/17/2006   Tdap 06/10/2021   Zoster Recombinant(Shingrix) 06/03/2021, 09/20/2021    Diabetes He presents for his follow-up diabetic visit. He has type 2 diabetes mellitus. Onset time: Diagnosed at approx age of 25. His disease course has been improving. There are no hypoglycemic associated symptoms. Associated symptoms include foot paresthesias. There are no hypoglycemic complications. Symptoms are stable. Diabetic complications include heart disease, nephropathy, peripheral neuropathy and PVD. Risk factors for coronary artery disease include diabetes mellitus, dyslipidemia,  family history, male sex and hypertension. Current diabetic treatment includes intensive insulin program. He is compliant with treatment most of the time. His weight is fluctuating minimally. He is following a generally healthy diet. Meal planning includes avoidance of concentrated sweets. He has had a previous visit with a dietitian. He participates in exercise intermittently. His home blood glucose trend is decreasing steadily. His breakfast blood glucose range is generally 110-130 mg/dl. His lunch blood glucose range is generally 130-140 mg/dl. His dinner blood glucose range is generally 140-180 mg/dl. His bedtime blood glucose range is generally >200 mg/dl. His overall blood glucose range is 180-200 mg/dl. (He presents today, accompanied by his wife, with his CGM showing mostly at goal glycemic profile.  His POCT A1c today is 10.2%, unchanged from previous visit.  Analysis of his CGM shows TIR 53%, TAR 47%, TBR 0% with a GMI of 7.8%.  He notes he will be having a TURP soon to help urine flow, currently wearing a catheter.) An ACE inhibitor/angiotensin II receptor blocker is being taken. He sees a podiatrist.Eye exam is current.     Review of systems  Constitutional: + Minimally fluctuating body weight, current Body mass index is 28.13 kg/m., no fatigue, no subjective hyperthermia, no subjective hypothermia Eyes: no blurry vision, no xerophthalmia ENT: no sore throat, no nodules palpated in throat, no dysphagia/odynophagia, no hoarseness Cardiovascular: no chest pain, no shortness of breath, no palpitations, no leg swelling Respiratory: no cough, no shortness of breath Gastrointestinal: no nausea/vomiting/diarrhea Musculoskeletal: no muscle/joint aches, walks with cane/walker Skin: no rashes, no hyperemia Neurological: no tremors, + numbness/tingling to BLE, no dizziness Psychiatric: no depression, no anxiety   Objective:     BP (!) 144/73 (BP Location: Left Arm, Patient Position: Sitting,  Cuff Size: Large)   Pulse 76   Ht 6\' 6"  (1.981 m)   Wt 243 lb 6.4 oz (110.4 kg)   BMI 28.13 kg/m   Wt Readings from Last 3 Encounters:  01/26/23 243 lb 6.4 oz (110.4 kg)  01/22/23 238 lb (108 kg)  12/18/22 238 lb (108 kg)     BP Readings from Last 3 Encounters:  01/26/23 [name])  144/73  12/18/22 108/60  12/05/22 (!) 155/65     Physical Exam- Limited  Constitutional:  Body mass index is 28.13 kg/m. , not in acute distress, normal state of mind Eyes:  EOMI, no exophthalmos Musculoskeletal: no gross deformities, strength intact in all four extremities, no gross restriction of joint movements, walks with cane/walker Skin:  no rashes, no hyperemia Neurological: no tremor with outstretched hands    CMP ( most recent) CMP     Component Value Date/Time   NA 134 (L) 08/30/2022 1509   NA 139 05/10/2022 0917   K 4.9 08/30/2022 1509   CL 106 08/30/2022 1509   CO2 22 08/30/2022 1509   GLUCOSE 138 (H) 08/30/2022 1509   BUN 34 (H) 08/30/2022 1509   BUN 14 05/10/2022 0917   CREATININE 1.71 (H) 08/30/2022 1509   CREATININE 1.34 (H) 02/02/2021 0839   CALCIUM 9.4 08/30/2022 1509   PROT 6.4 (L) 08/20/2022 1955   PROT 6.0 05/10/2022 0917   ALBUMIN 3.3 (L) 08/20/2022 1955   ALBUMIN 3.9 05/10/2022 0917   AST 24 08/20/2022 1955   ALT 28 08/20/2022 1955   ALKPHOS 95 08/20/2022 1955   BILITOT 0.8 08/20/2022 1955   BILITOT 1.1 05/10/2022 0917   GFRNONAA 39 (L) 08/30/2022 1509   GFRNONAA 54 (L) 04/29/2020 1141   GFRAA 63 04/29/2020 1141     Diabetic Labs (most recent): Lab Results  Component Value Date   HGBA1C 10.2 (A) 01/26/2023   HGBA1C 10.1 (H) 08/21/2022   HGBA1C 10.1 (A) 04/20/2022   MICROALBUR 5.0 02/02/2021   MICROALBUR 1.4 09/22/2019   MICROALBUR 0.67 12/17/2006     Lipid Panel ( most recent) Lipid Panel     Component Value Date/Time   CHOL 126 08/21/2022 0502   CHOL 125 05/10/2022 0917   TRIG 74 08/21/2022 0502   HDL 34 (L) 08/21/2022 0502   HDL 46 05/10/2022  0917   CHOLHDL 3.7 08/21/2022 0502   VLDL 15 08/21/2022 0502   LDLCALC 77 08/21/2022 0502   LDLCALC 65 05/10/2022 0917   LDLCALC 73 11/03/2020 1032   LABVLDL 14 05/10/2022 0917      Lab Results  Component Value Date   TSH 1.510 08/20/2022   TSH 1.730 05/10/2022   TSH 2.140 12/23/2021   TSH 1.790 06/14/2021   TSH 1.45 11/03/2020   TSH 1.75 03/31/2020   TSH 1.17 09/22/2019   TSH 0.94 03/19/2019   TSH 1.039 09/20/2017   TSH 0.26 (A) 03/27/2014   FREET4 1.45 05/10/2022   FREET4 1.41 12/23/2021   FREET4 1.3 09/22/2019   FREET4 1.3 03/19/2019   FREET4 1.32 07/31/2011   FREET4 1.09 06/17/2008   FREET4 1.31 12/18/2007   FREET4 1.27 05/10/2006           Assessment & Plan:   1) Type 2 diabetes mellitus with stage 3a chronic kidney disease, with long-term current use of insulin (HCC)  He presents today, accompanied by his wife, with his CGM showing mostly at goal glycemic profile.  His POCT A1c today is 10.2%, unchanged from previous visit.  Analysis of his CGM shows TIR 53%, TAR 47%, TBR 0% with a GMI of 7.8%.  He notes he will be having a TURP soon to help urine flow, currently wearing a catheter.  - Dylan Snell. has currently uncontrolled symptomatic type 2 DM since 83 years of age.   -Recent labs reviewed.  - I had a long discussion with him about the progressive nature of diabetes  and the pathology behind its complications. -his diabetes is complicated by CAD, CKD, PVD, neuropathy and he remains at a high risk for more acute and chronic complications which include CAD, CVA, CKD, retinopathy, and neuropathy. These are all discussed in detail with him.  The following Lifestyle Medicine recommendations according to American College of Lifestyle Medicine [facility]) were discussed and offered to patient and he agrees to start the journey:  A. Whole Foods, Plant-based plate comprising of fruits and vegetables, plant-based proteins, whole-grain carbohydrates was discussed  in detail with the patient.   A list for source of those nutrients were also provided to the patient.  Patient will use only water or unsweetened tea for hydration. B.  The need to stay away from risky substances including alcohol, smoking; obtaining 7 to 9 hours of restorative sleep, at least 150 minutes of moderate intensity exercise weekly, the importance of healthy social connections,  and stress reduction techniques were discussed. C.  A full color page of  Calorie density of various food groups per pound showing examples of each food groups was provided to the patient.  - Nutritional counseling repeated at each appointment due to patients tendency to fall back in to old habits.  - The patient admits there is a room for improvement in their diet and drink choices. -  Suggestion is made for the patient to avoid simple carbohydrates from their diet including Cakes, Sweet Desserts / Pastries, Ice Cream, Soda (diet and regular), Sweet Tea, Candies, Chips, Cookies, Sweet Pastries, Store Bought Juices, Alcohol in Excess of 1-2 drinks a day, Artificial Sweeteners, Coffee Creamer, and "Sugar-free" Products. This will help patient to have stable blood glucose profile and potentially avoid unintended weight gain.   - I encouraged the patient to switch to unprocessed or minimally processed complex starch and increased protein intake (animal or plant source), fruits, and vegetables.   - Patient is advised to stick to a routine mealtimes to eat 3 meals a day and avoid unnecessary snacks (to snack only to correct hypoglycemia).  - I have approached him with the following individualized plan to manage his diabetes and patient agrees:   -He is advised to continue his Tresiba 20 units SQ nightly and adjust his Novolog to 5-11 units TID with meals if glucose is above 90 and he is eating (Specific instructions on how to titrate insulin dosage based on glucose readings given to patient in writing).  He has missed  doses of insulin as his glucose was not high enough to inject.  -he is encouraged to continue monitoring glucose 4 times daily (using his CGM), before meals and before bed, and to call the clinic if he has readings less than 70 or above 300 for 3 tests in a row.  - he is warned not to take insulin without proper monitoring per orders. - Adjustment parameters are given to him for hypo and hyperglycemia in writing.  - his Glipizide was be discontinued, risk outweighs benefit for this patient- risk of hypoglycemia given advanced age and CKD. - he is not a candidate for full dose Metformin due to concurrent renal insufficiency, I discontinued this on previously to de-escalate his treatment and preserve kidney function.  - he is not an ideal candidate for incretin therapy due to body habitus with BMI at 25.  -I would not consider him to be a great candidate for SGLT2i despite his CHF history as it may increase his risk of falls (due to polyuria), UTI, and yeast formation.  -  Specific targets for  A1c; LDL, HDL, and Triglycerides were discussed with the patient.  2) Blood Pressure /Hypertension:  his blood pressure is controlled to target for his age.   he is advised to continue his current medications including Norvasc 5 mg p.o. daily with breakfast, Lasix 20 mg po daily as needed, Lisinopril 20 mg po daily and Metoprolol 50 mg po daily.  3) Lipids/Hyperlipidemia:    Review of his recent lipid panel from 05/10/22 showed controlled LDL at 65.  he is advised to continue Lipitor 80 mg daily at bedtime and Zetia 10 mg po daily.  Side effects and precautions discussed with him.    4)  Weight/Diet:  his Body mass index is 28.13 kg/m.  -  he is NOT a candidate for weight loss.  Exercise, and detailed carbohydrates information provided  -  detailed on discharge instructions.  5) Hypothyroidism-unspecified The details surrounding his diagnosis are not available.    There are no recent TFTs to review.   He is advised to continue his Levothyroxine 88 mcg po daily before breakfast.  Will recheck TFTs prior to next visit.   - The correct intake of thyroid hormone (Levothyroxine, Synthroid), is on empty stomach first thing in the morning, with water, separated by at least 30 minutes from breakfast and other medications,  and separated by more than 4 hours from calcium, iron, multivitamins, acid reflux medications (PPIs).  - This medication is a life-long medication and will be needed to correct thyroid hormone imbalances for the rest of your life.  The dose may change from time to time, based on thyroid blood work.  - It is extremely important to be consistent taking this medication, near the same time each morning.  -AVOID TAKING PRODUCTS CONTAINING BIOTIN (commonly found in Hair, Skin, Nails vitamins) AS IT INTERFERES WITH THE VALIDITY OF THYROID FUNCTION BLOOD TESTS.  6) Vitamin D deficiency His most recent vitamin D level from 05/10/22 was slightly low at 28.8.  He is already taking supplement but not sure of the amount.  I advised to take Vitamin d3 5000 units daily.  7) Chronic Care/Health Maintenance: -he is on ACEI/ARB and Statin medications and is encouraged to initiate and continue to follow up with Ophthalmology, Dentist, Podiatrist at least yearly or according to recommendations, and advised to stay away from smoking. I have recommended yearly flu vaccine and pneumonia vaccine at least every 5 years; moderate intensity exercise for up to 150 minutes weekly; and sleep for at least 7 hours a day.  - he is advised to maintain close follow up with Dylan Robles, Dylan Mellow, Dylan Robles for primary care needs, as well as his other providers for optimal and coordinated care.     I spent  38  minutes in the care of the patient today including review of labs from CMP, Lipids, Thyroid Function, Hematology (current and previous including abstractions from other facilities); face-to-face time discussing  his blood  glucose readings/logs, discussing hypoglycemia and hyperglycemia episodes and symptoms, medications doses, his options of short and long term treatment based on the latest standards of care / guidelines;  discussion about incorporating lifestyle medicine;  and documenting the encounter. Risk reduction counseling performed per USPSTF guidelines to reduce obesity and cardiovascular risk factors.     Please refer to Patient Instructions for Blood Glucose Monitoring and Insulin/Medications Dosing Guide"  in media tab for additional information. Please  also refer to " Patient Self Inventory" in the Media  tab for reviewed elements  of pertinent patient history.  Dylan Primus Stanford. participated in the discussions, expressed understanding, and voiced agreement with the above plans.  All questions were answered to his satisfaction. he is encouraged to contact clinic should he have any questions or concerns prior to his return visit.     Follow up plan: - Return in about 3 months (around 04/26/2023) for Diabetes F/U with A1c in office, Thyroid follow up, Previsit labs, Bring meter and logs.  Ronny Bacon, [facility] [address] [place], [place] [address] Phone: [phone] Fax: [phone]  01/26/2023, 10:59 AM

## 2023-01-29 ENCOUNTER — Other Ambulatory Visit: Payer: Self-pay

## 2023-01-29 NOTE — Patient Instructions (Signed)
Dylan Robles.  01/29/2023     @PREFPERIOPPHARMACY @   Your procedure is scheduled on  02/01/2023.   Report to [facility] at  0700  A.M.   Call this number if you have problems the morning of surgery:  [phone]  If you experience any cold or flu symptoms such as cough, fever, chills, shortness of breath, etc. between now and your scheduled surgery, please notify us at the above number.   Remember:        Take 1/2 of your night time dose of tresiba the night before your procedure.       DO NOT take any medications for diabetes the morning of your procedure.        Your last dose of eliquis should be on      Do not eat after midnight.   You may drink clear liquids until  0500 am on 02/01/2023.    Clear liquids allowed are:                    Water, Juice (No red color; non-citric and without pulp; diabetics please choose diet or no sugar options), Carbonated beverages (diabetics please choose diet or no sugar options), Clear Tea (No creamer, milk, or cream, including half & half and powdered creamer), Black Coffee Only (No creamer, milk or cream, including half & half and powdered creamer), and Clear Sports drink (No red color; diabetics please choose diet or no sugar options)    Take these medicines the morning of surgery with A SIP OF WATER         gabapentin, isosorbide, levothyroxine, metoprolol, tamsulosin.     Do not wear jewelry, make-up or nail polish, including gel polish,  artificial nails, or any other type of covering on natural nails (fingers and  toes).  Do not wear lotions, powders, or perfumes, or deodorant.  Do not shave 48 hours prior to surgery.  Men may shave face and neck.  Do not bring valuables to the hospital.  [facility] is not responsible for any belongings or valuables.  Contacts, dentures or bridgework may not be worn into surgery.  Leave your suitcase in the car.  After surgery it may be brought to your room.  For patients  admitted to the hospital, discharge time will be determined by your treatment team.  Patients discharged the day of surgery will not be allowed to drive home and must have someone with them for 24 hours.    Special instructions:   DO NOT smoke tobacco or vape for 24 hours before your procedure.  Please read over the following fact sheets that you were given. Pain Booklet, Coughing and Deep Breathing, Surgical Site Infection Prevention, Anesthesia Post-op Instructions, and Care and Recovery After Surgery        Transurethral Resection of the Prostate, Care After The following information offers guidance on how to care for yourself after your procedure. Your health care provider may also give you more specific instructions. If you have problems or questions, contact your health care provider. What can I expect after the procedure? After the procedure, it is common to have: Mild pain in your lower abdomen. Soreness or mild discomfort in your penis or when you urinate. This is from having the catheter inserted during the procedure. A sudden urge to urinate (urgency). A need to urinate often. A small amount of blood in your urine. You may notice some small blood clots  in your urine. These are normal. Follow these instructions at home: Medicines Take over-the-counter and prescription medicines only as told by your health care provider. If you were prescribed an antibiotic medicine, take it as told by your health care provider. Do not stop taking the antibiotic even if you start to feel better. Activity  Rest as told by your health care provider. Avoid sitting for a long time without moving. Get up to take short walks every 1-2 hours. This is important to improve blood flow and breathing. Ask for help if you feel weak or unsteady. You may increase your physical activity gradually as you start to feel better. Do not drive or operate machinery until your health care provider says that it is  safe. Do not ride in a car for long periods of time, or as told by your health care provider. Avoid intense physical activity for as long as told by your health care provider. Do not lift anything that is heavier than 10 lb (4.5 kg), or the limit that you are told, until your health care provider says that it is safe. Do not have sex until your health care provider approves. Return to your normal activities as told by your health care provider. Ask your health care provider what activities are safe for you. Preventing constipation You may need to take these actions to prevent or treat constipation: Drink enough fluid to keep your urine pale yellow. Take over-the-counter or prescription medicines. Eat foods that are high in fiber, such as beans, whole grains, and fresh fruits and vegetables. Limit foods that are high in fat and processed sugars, such as fried or sweet foods.  General instructions Do not strain when you have a bowel movement. Straining may lead to bleeding from the prostate. This may cause blood clots and trouble urinating. Do not use any products that contain nicotine or tobacco. These products include cigarettes, chewing tobacco, and vaping devices, such as e-cigarettes. If you need help quitting, ask your health care provider. If you go home with a tube draining your urine (urinary catheter), care for the catheter as told by your health care provider. Wear compression stockings as told by your health care provider. These stockings help to prevent blood clots and reduce swelling in your legs. Keep all follow-up visits. This is important. Contact a health care provider if: You have signs of infection, such as: Fever or chills. Urine that smells very bad. Swelling around your urethra that is getting worse. Swelling in your penis or testicles. You have difficulty urinating. You have pain that gets worse or does not improve with medicine. You have blood in your urine that does  not go away after 1 week of resting and drinking more fluids. You have trouble having a bowel movement. You have trouble having or keeping an erection. No semen comes out during orgasm (dry ejaculation). You have a urinary catheter in place, and you have: Spasms or pain. Problems with your catheter or your catheter is blocked. Get help right away if: You are unable to urinate. You are having more blood clots in your urine instead of fewer. You have: Large blood clots. A lot of blood in your urine. Pain in your back or lower abdomen. You have difficulty breathing or shortness of breath. You develop swelling or pain in your leg. These symptoms may be an emergency. Get help right away. Call 911. Do not wait to see if the symptoms will go away. Do not drive yourself to  the hospital. Summary After the procedure, it is common to have a small amount of blood in your urine. Follow restrictions about lifting and sexual activity as told by your health care provider. Ask what activities are safe for you. Keep all follow-up visits. This is important. This information is not intended to replace advice given to you by your health care provider. Make sure you discuss any questions you have with your health care provider. Document Revised: 09/21/2020 Document Reviewed: 09/21/2020 Elsevier Patient Education  2024 [employer].General Anesthesia, Adult, Care After The following information offers guidance on how to care for yourself after your procedure. Your health care provider may also give you more specific instructions. If you have problems or questions, contact your health care provider. What can I expect after the procedure? After the procedure, it is common for people to: Have pain or discomfort at the IV site. Have nausea or vomiting. Have a sore throat or hoarseness. Have trouble concentrating. Feel cold or chills. Feel weak, sleepy, or tired (fatigue). Have soreness and body aches. These  can affect parts of the body that were not involved in surgery. Follow these instructions at home: For the time period you were told by your health care provider:  Rest. Do not participate in activities where you could fall or become injured. Do not drive or use machinery. Do not drink alcohol. Do not take sleeping pills or medicines that cause drowsiness. Do not make important decisions or sign legal documents. Do not take care of children on your own. General instructions Drink enough fluid to keep your urine pale yellow. If you have sleep apnea, surgery and certain medicines can increase your risk for breathing problems. Follow instructions from your health care provider about wearing your sleep device: Anytime you are sleeping, including during daytime naps. While taking prescription pain medicines, sleeping medicines, or medicines that make you drowsy. Return to your normal activities as told by your health care provider. Ask your health care provider what activities are safe for you. Take over-the-counter and prescription medicines only as told by your health care provider. Do not use any products that contain nicotine or tobacco. These products include cigarettes, chewing tobacco, and vaping devices, such as e-cigarettes. These can delay incision healing after surgery. If you need help quitting, ask your health care provider. Contact a health care provider if: You have nausea or vomiting that does not get better with medicine. You vomit every time you eat or drink. You have pain that does not get better with medicine. You cannot urinate or have bloody urine. You develop a skin rash. You have a fever. Get help right away if: You have trouble breathing. You have chest pain. You vomit blood. These symptoms may be an emergency. Get help right away. Call 911. Do not wait to see if the symptoms will go away. Do not drive yourself to the hospital. Summary After the procedure, it is  common to have a sore throat, hoarseness, nausea, vomiting, or to feel weak, sleepy, or fatigue. For the time period you were told by your health care provider, do not drive or use machinery. Get help right away if you have difficulty breathing, have chest pain, or vomit blood. These symptoms may be an emergency. This information is not intended to replace advice given to you by your health care provider. Make sure you discuss any questions you have with your health care provider. Document Revised: 03/25/2021 Document Reviewed: 03/25/2021 Elsevier Patient Education  2024 Elsevier  Inc.How to Use Chlorhexidine at Home in the Shower Chlorhexidine gluconate (CHG) is a germ-killing (antiseptic) wash that's used to clean the skin. It can get rid of the germs that normally live on the skin and can keep them away for about 24 hours. If you're having surgery, you may be told to shower with CHG at home the night before surgery. This can help lower your risk for infection. To use CHG wash in the shower, follow the steps below. Supplies needed: CHG body wash. Clean washcloth. Clean towel. How to use CHG in the shower Follow these steps unless you're told to use CHG in a different way: Start the shower. Use your normal soap and shampoo to wash your face and hair. Turn off the shower or move out of the shower stream. Pour CHG onto a clean washcloth. Do not use any type of brush or rough sponge. Start at your neck, washing your body down to your toes. Make sure you: Wash the part of your body where the surgery will be done for at least 1 minute. Do not scrub. Do not use CHG on your head or face unless your health care provider tells you to. If it gets into your ears or eyes, rinse them well with water. Do not wash your genitals with CHG. Wash your back and under your arms. Make sure to wash skin folds. Let the CHG sit on your skin for 1-2 minutes or as long as told. Rinse your entire body in the shower,  including all body creases and folds. Turn off the shower. Dry off with a clean towel. Do not put anything on your skin afterward, such as powder, lotion, or perfume. Put on clean clothes or pajamas. If it's the night before surgery, sleep in clean sheets. General tips Use CHG only as told, and follow the instructions on the label. Use the full amount of CHG as told. This is often one bottle. Do not smoke and stay away from flames after using CHG. Your skin may feel sticky after using CHG. This is normal. The sticky feeling will go away as the CHG dries. Do not use CHG: If you have a chlorhexidine allergy or have reacted to chlorhexidine in the past. On open wounds or areas of skin that have broken skin, cuts, or scrapes. On babies younger than 29 months of age. Contact a health care provider if: You have questions about using CHG. Your skin gets irritated or itchy. You have a rash after using CHG. You swallow any CHG. Call your local poison control center [phone] in the [place] Your eyes itch badly, or they become very red or swollen. Your hearing changes. You have trouble seeing. If you can't reach your provider, go to an urgent care or emergency room. Do not drive yourself. Get help right away if: You have swelling or tingling in your mouth or throat. You make high-pitched whistling sounds when you breathe, most often when you breathe out (wheeze). You have trouble breathing. These symptoms may be an emergency. Call 911 right away. Do not wait to see if the symptoms will go away. Do not drive yourself to the hospital. This information is not intended to replace advice given to you by your health care provider. Make sure you discuss any questions you have with your health care provider. Document Revised: 07/11/2022 Document Reviewed: 07/07/2021 Elsevier Patient Education  2024 [employer].

## 2023-01-30 ENCOUNTER — Other Ambulatory Visit: Payer: Self-pay

## 2023-01-30 ENCOUNTER — Encounter (HOSPITAL_COMMUNITY): Payer: Self-pay

## 2023-01-30 ENCOUNTER — Other Ambulatory Visit (HOSPITAL_COMMUNITY): Payer: Self-pay

## 2023-01-30 ENCOUNTER — Encounter (HOSPITAL_COMMUNITY)
Admission: RE | Admit: 2023-01-30 | Discharge: 2023-01-30 | Disposition: A | Discharge status: Home / Self Care | Payer: PPO | Source: Ambulatory Visit | Attending: Urology | Admitting: Urology

## 2023-01-30 VITALS — BP 146/74 | HR 76 | Temp 98.0°F | Ht 78.0 in | Wt 243.4 lb

## 2023-01-30 DIAGNOSIS — E1122 Type 2 diabetes mellitus with diabetic chronic kidney disease: Secondary | ICD-10-CM | POA: Insufficient documentation

## 2023-01-30 DIAGNOSIS — N1831 Chronic kidney disease, stage 3a: Secondary | ICD-10-CM | POA: Insufficient documentation

## 2023-01-30 DIAGNOSIS — Z01812 Encounter for preprocedural laboratory examination: Secondary | ICD-10-CM | POA: Insufficient documentation

## 2023-01-30 HISTORY — DX: Sleep apnea, unspecified: G47.30

## 2023-01-30 LAB — CBC WITH DIFFERENTIAL/PLATELET
Abs Immature Granulocytes: 0.02 10*3/uL (ref 0.00–0.07)
Basophils Absolute: 0 10*3/uL (ref 0.0–0.1)
Basophils Relative: 1 %
Eosinophils Absolute: 0.3 10*3/uL (ref 0.0–0.5)
Eosinophils Relative: 4 %
HCT: 36.8 % — ABNORMAL LOW (ref 39.0–52.0)
Hemoglobin: 11.6 g/dL — ABNORMAL LOW (ref 13.0–17.0)
Immature Granulocytes: 0 %
Lymphocytes Relative: 20 %
Lymphs Abs: 1.4 10*3/uL (ref 0.7–4.0)
MCH: 29.4 pg (ref 26.0–34.0)
MCHC: 31.5 g/dL (ref 30.0–36.0)
MCV: 93.2 fL (ref 80.0–100.0)
Monocytes Absolute: 0.6 10*3/uL (ref 0.1–1.0)
Monocytes Relative: 8 %
Neutro Abs: 4.6 10*3/uL (ref 1.7–7.7)
Neutrophils Relative %: 67 %
Platelets: 207 10*3/uL (ref 150–400)
RBC: 3.95 MIL/uL — ABNORMAL LOW (ref 4.22–5.81)
RDW: 15.2 % (ref 11.5–15.5)
WBC: 6.9 10*3/uL (ref 4.0–10.5)
nRBC: 0 % (ref 0.0–0.2)

## 2023-01-30 LAB — BASIC METABOLIC PANEL
Anion gap: 11 (ref 5–15)
BUN: 27 mg/dL — ABNORMAL HIGH (ref 8–23)
CO2: 24 mmol/L (ref 22–32)
Calcium: 9.7 mg/dL (ref 8.9–10.3)
Chloride: 102 mmol/L (ref 98–111)
Creatinine, Ser: 1.8 mg/dL — ABNORMAL HIGH (ref 0.61–1.24)
GFR, Estimated: 37 mL/min — ABNORMAL LOW (ref >60)
Glucose, Bld: 172 mg/dL — ABNORMAL HIGH (ref 70–99)
Potassium: 3.8 mmol/L (ref 3.5–5.1)
Sodium: 137 mmol/L (ref 135–145)

## 2023-01-30 NOTE — Pre-Procedure Instructions (Signed)
Patient and wife present at PAT visit today. PAT instructions given.

## 2023-01-30 NOTE — Pre-Procedure Instructions (Signed)
Per patient's wife he has stopped taking Eliquis, last dose yesterday. She states someone called her and told her they will contact dr about this but she decided not to give this to him this morning.

## 2023-02-01 ENCOUNTER — Inpatient Hospital Stay (HOSPITAL_COMMUNITY)
Admission: AD | Admit: 2023-02-01 | Discharge: 2023-02-03 | DRG: 713 | Disposition: A | Discharge status: Home / Self Care | Payer: PPO | Source: Ambulatory Visit | Attending: Urology | Admitting: Urology

## 2023-02-01 ENCOUNTER — Encounter (HOSPITAL_COMMUNITY)
Admission: AD | Disposition: A | Discharge status: Home / Self Care | Payer: Self-pay | Source: Ambulatory Visit | Attending: Urology

## 2023-02-01 ENCOUNTER — Ambulatory Visit (HOSPITAL_COMMUNITY): Payer: PPO | Admitting: Anesthesiology

## 2023-02-01 ENCOUNTER — Other Ambulatory Visit (HOSPITAL_COMMUNITY): Payer: Self-pay

## 2023-02-01 ENCOUNTER — Encounter (HOSPITAL_COMMUNITY): Payer: Self-pay | Admitting: Urology

## 2023-02-01 ENCOUNTER — Other Ambulatory Visit: Payer: Self-pay

## 2023-02-01 DIAGNOSIS — R310 Gross hematuria: Secondary | ICD-10-CM | POA: Diagnosis present

## 2023-02-01 DIAGNOSIS — Z794 Long term (current) use of insulin: Secondary | ICD-10-CM

## 2023-02-01 DIAGNOSIS — Z87891 Personal history of nicotine dependence: Secondary | ICD-10-CM

## 2023-02-01 DIAGNOSIS — D72829 Elevated white blood cell count, unspecified: Secondary | ICD-10-CM | POA: Diagnosis present

## 2023-02-01 DIAGNOSIS — E7800 Pure hypercholesterolemia, unspecified: Secondary | ICD-10-CM | POA: Diagnosis present

## 2023-02-01 DIAGNOSIS — Z79899 Other long term (current) drug therapy: Secondary | ICD-10-CM

## 2023-02-01 DIAGNOSIS — Z809 Family history of malignant neoplasm, unspecified: Secondary | ICD-10-CM

## 2023-02-01 DIAGNOSIS — N401 Enlarged prostate with lower urinary tract symptoms: Secondary | ICD-10-CM

## 2023-02-01 DIAGNOSIS — C61 Malignant neoplasm of prostate: Secondary | ICD-10-CM | POA: Diagnosis not present

## 2023-02-01 DIAGNOSIS — Z888 Allergy status to other drugs, medicaments and biological substances status: Secondary | ICD-10-CM

## 2023-02-01 DIAGNOSIS — Z833 Family history of diabetes mellitus: Secondary | ICD-10-CM

## 2023-02-01 DIAGNOSIS — N400 Enlarged prostate without lower urinary tract symptoms: Principal | ICD-10-CM | POA: Diagnosis present

## 2023-02-01 DIAGNOSIS — E1142 Type 2 diabetes mellitus with diabetic polyneuropathy: Secondary | ICD-10-CM | POA: Diagnosis present

## 2023-02-01 DIAGNOSIS — Z7901 Long term (current) use of anticoagulants: Secondary | ICD-10-CM

## 2023-02-01 DIAGNOSIS — Z8249 Family history of ischemic heart disease and other diseases of the circulatory system: Secondary | ICD-10-CM

## 2023-02-01 DIAGNOSIS — E875 Hyperkalemia: Secondary | ICD-10-CM | POA: Diagnosis present

## 2023-02-01 DIAGNOSIS — I2510 Atherosclerotic heart disease of native coronary artery without angina pectoris: Secondary | ICD-10-CM | POA: Diagnosis present

## 2023-02-01 DIAGNOSIS — I10 Essential (primary) hypertension: Secondary | ICD-10-CM | POA: Diagnosis present

## 2023-02-01 DIAGNOSIS — R338 Other retention of urine: Secondary | ICD-10-CM

## 2023-02-01 DIAGNOSIS — Z79890 Hormone replacement therapy (postmenopausal): Secondary | ICD-10-CM

## 2023-02-01 DIAGNOSIS — G4730 Sleep apnea, unspecified: Secondary | ICD-10-CM | POA: Diagnosis present

## 2023-02-01 DIAGNOSIS — N138 Other obstructive and reflux uropathy: Secondary | ICD-10-CM | POA: Diagnosis present

## 2023-02-01 DIAGNOSIS — E039 Hypothyroidism, unspecified: Secondary | ICD-10-CM | POA: Diagnosis present

## 2023-02-01 DIAGNOSIS — Z8673 Personal history of transient ischemic attack (TIA), and cerebral infarction without residual deficits: Secondary | ICD-10-CM

## 2023-02-01 HISTORY — PX: TRANSURETHRAL RESECTION OF PROSTATE: SHX73

## 2023-02-01 HISTORY — PX: CYSTOSCOPY: SHX5120

## 2023-02-01 LAB — BASIC METABOLIC PANEL
Anion gap: 8 (ref 5–15)
BUN: 29 mg/dL — ABNORMAL HIGH (ref 8–23)
CO2: 23 mmol/L (ref 22–32)
Calcium: 9 mg/dL (ref 8.9–10.3)
Chloride: 107 mmol/L (ref 98–111)
Creatinine, Ser: 1.72 mg/dL — ABNORMAL HIGH (ref 0.61–1.24)
GFR, Estimated: 39 mL/min — ABNORMAL LOW (ref >60)
Glucose, Bld: 142 mg/dL — ABNORMAL HIGH (ref 70–99)
Potassium: 4.6 mmol/L (ref 3.5–5.1)
Sodium: 138 mmol/L (ref 135–145)

## 2023-02-01 LAB — CBC
HCT: 34.6 % — ABNORMAL LOW (ref 39.0–52.0)
Hemoglobin: 10.9 g/dL — ABNORMAL LOW (ref 13.0–17.0)
MCH: 29.1 pg (ref 26.0–34.0)
MCHC: 31.5 g/dL (ref 30.0–36.0)
MCV: 92.3 fL (ref 80.0–100.0)
Platelets: 177 10*3/uL (ref 150–400)
RBC: 3.75 MIL/uL — ABNORMAL LOW (ref 4.22–5.81)
RDW: 15.1 % (ref 11.5–15.5)
WBC: 7.1 10*3/uL (ref 4.0–10.5)
nRBC: 0 % (ref 0.0–0.2)

## 2023-02-01 LAB — GLUCOSE, CAPILLARY
Glucose-Capillary: 148 mg/dL — ABNORMAL HIGH (ref 70–99)
Glucose-Capillary: 119 mg/dL — ABNORMAL HIGH (ref 70–99)
Glucose-Capillary: 122 mg/dL — ABNORMAL HIGH (ref 70–99)
Glucose-Capillary: 104 mg/dL — ABNORMAL HIGH (ref 70–99)

## 2023-02-01 SURGERY — CYSTOSCOPY
Anesthesia: General | Site: Prostate

## 2023-02-01 MED ORDER — ACETAMINOPHEN 325 MG PO TABS
650.0000 mg | ORAL_TABLET | ORAL | Status: DC | PRN
  Administered 2023-02-01: 650 mg via ORAL
  Filled 2023-02-01: qty 2

## 2023-02-01 MED ORDER — GABAPENTIN 300 MG PO CAPS
300.0000 mg | ORAL_CAPSULE | Freq: Three times a day (TID) | ORAL | Status: DC
  Administered 2023-02-01 – 2023-02-03 (×6): 300 mg via ORAL
  Filled 2023-02-01 (×6): qty 1

## 2023-02-01 MED ORDER — OXYCODONE HCL 5 MG/5ML PO SOLN: 5.0000 mg | Freq: Once | ORAL | Status: DC | PRN

## 2023-02-01 MED ORDER — FENTANYL CITRATE (PF) 100 MCG/2ML IJ SOLN
INTRAMUSCULAR | Status: AC
  Filled 2023-02-01: qty 2

## 2023-02-01 MED ORDER — SODIUM CHLORIDE 0.9 % IR SOLN
Status: DC | PRN
  Administered 2023-02-01 (×7): 3000 mL via INTRAVESICAL

## 2023-02-01 MED ORDER — SODIUM CHLORIDE 0.9 % IV SOLN
2.0000 g | INTRAVENOUS | Status: AC
  Administered 2023-02-01: 2 g via INTRAVENOUS
  Filled 2023-02-01: qty 20

## 2023-02-01 MED ORDER — ZOLPIDEM TARTRATE 5 MG PO TABS: 5.0000 mg | ORAL_TABLET | Freq: Every evening | ORAL | Status: DC | PRN

## 2023-02-01 MED ORDER — OXYCODONE HCL 5 MG PO TABS: 5.0000 mg | ORAL_TABLET | Freq: Once | ORAL | Status: DC | PRN

## 2023-02-01 MED ORDER — ORAL CARE MOUTH RINSE: 15.0000 mL | Freq: Once | OROMUCOSAL | Status: AC

## 2023-02-01 MED ORDER — CIPROFLOXACIN IN D5W 400 MG/200ML IV SOLN
400.0000 mg | Freq: Once | INTRAVENOUS | Status: AC
  Administered 2023-02-01: 400 mg via INTRAVENOUS
  Filled 2023-02-01: qty 200

## 2023-02-01 MED ORDER — LORATADINE 10 MG PO TABS
10.0000 mg | ORAL_TABLET | Freq: Every day | ORAL | Status: DC
  Administered 2023-02-02 – 2023-02-03 (×2): 10 mg via ORAL
  Filled 2023-02-01 (×2): qty 1

## 2023-02-01 MED ORDER — CHLORHEXIDINE GLUCONATE 0.12 % MT SOLN
15.0000 mL | Freq: Once | OROMUCOSAL | Status: AC
  Administered 2023-02-01: 15 mL via OROMUCOSAL

## 2023-02-01 MED ORDER — EZETIMIBE 10 MG PO TABS
10.0000 mg | ORAL_TABLET | Freq: Every day | ORAL | Status: DC
Start: 2023-02-02 — End: 2023-02-03
  Administered 2023-02-02 – 2023-02-03 (×2): 10 mg via ORAL
  Filled 2023-02-01 (×2): qty 1

## 2023-02-01 MED ORDER — WATER FOR IRRIGATION, STERILE IR SOLN
Status: DC | PRN
  Administered 2023-02-01: 500 mL

## 2023-02-01 MED ORDER — SENNOSIDES-DOCUSATE SODIUM 8.6-50 MG PO TABS
2.0000 | ORAL_TABLET | Freq: Every day | ORAL | Status: DC
  Administered 2023-02-01 – 2023-02-02 (×2): 2 via ORAL
  Filled 2023-02-01 (×2): qty 2

## 2023-02-01 MED ORDER — LIDOCAINE HCL (CARDIAC) PF 100 MG/5ML IV SOSY
PREFILLED_SYRINGE | INTRAVENOUS | Status: DC | PRN
  Administered 2023-02-01: 100 mg via INTRAVENOUS

## 2023-02-01 MED ORDER — LEVOTHYROXINE SODIUM 88 MCG PO TABS
88.0000 ug | ORAL_TABLET | Freq: Every day | ORAL | Status: DC
  Administered 2023-02-02 – 2023-02-03 (×2): 88 ug via ORAL
  Filled 2023-02-01 (×2): qty 1

## 2023-02-01 MED ORDER — ONDANSETRON HCL 4 MG/2ML IJ SOLN: 4.0000 mg | INTRAMUSCULAR | Status: DC | PRN

## 2023-02-01 MED ORDER — LACTATED RINGERS IV SOLN: INTRAVENOUS | Status: DC

## 2023-02-01 MED ORDER — HYDROCODONE-ACETAMINOPHEN 5-325 MG PO TABS: 1.0000 | ORAL_TABLET | ORAL | Status: DC | PRN

## 2023-02-01 MED ORDER — INSULIN ASPART 100 UNIT/ML IJ SOLN
0.0000 [IU] | Freq: Every day | INTRAMUSCULAR | Status: DC
  Administered 2023-02-02: 5 [IU] via SUBCUTANEOUS

## 2023-02-01 MED ORDER — OXYBUTYNIN CHLORIDE 5 MG PO TABS: 5.0000 mg | ORAL_TABLET | Freq: Three times a day (TID) | ORAL | Status: DC | PRN

## 2023-02-01 MED ORDER — ONDANSETRON HCL 4 MG/2ML IJ SOLN
INTRAMUSCULAR | Status: DC | PRN
  Administered 2023-02-01: 4 mg via INTRAVENOUS

## 2023-02-01 MED ORDER — ATORVASTATIN CALCIUM 40 MG PO TABS
80.0000 mg | ORAL_TABLET | Freq: Every day | ORAL | Status: DC
Start: 2023-02-02 — End: 2023-02-03
  Administered 2023-02-02 – 2023-02-03 (×2): 80 mg via ORAL
  Filled 2023-02-01 (×2): qty 2

## 2023-02-01 MED ORDER — GLYCOPYRROLATE PF 0.2 MG/ML IJ SOSY
PREFILLED_SYRINGE | INTRAMUSCULAR | Status: DC | PRN
  Administered 2023-02-01: .2 mg via INTRAVENOUS

## 2023-02-01 MED ORDER — FENTANYL CITRATE PF 50 MCG/ML IJ SOSY: 25.0000 ug | PREFILLED_SYRINGE | INTRAMUSCULAR | Status: DC | PRN

## 2023-02-01 MED ORDER — DIPHENHYDRAMINE HCL 50 MG/ML IJ SOLN: 12.5000 mg | Freq: Four times a day (QID) | INTRAMUSCULAR | Status: DC | PRN

## 2023-02-01 MED ORDER — PROPOFOL 10 MG/ML IV BOLUS
INTRAVENOUS | Status: DC | PRN
  Administered 2023-02-01: 120 mg via INTRAVENOUS

## 2023-02-01 MED ORDER — SODIUM CHLORIDE 0.9 % IV BOLUS
500.0000 mL | Freq: Once | INTRAVENOUS | Status: AC
  Administered 2023-02-01: 500 mL via INTRAVENOUS

## 2023-02-01 MED ORDER — ONDANSETRON HCL 4 MG/2ML IJ SOLN: 4.0000 mg | Freq: Once | INTRAMUSCULAR | Status: DC | PRN

## 2023-02-01 MED ORDER — ISOSORBIDE MONONITRATE ER 60 MG PO TB24
60.0000 mg | ORAL_TABLET | Freq: Every day | ORAL | Status: DC
  Administered 2023-02-02 – 2023-02-03 (×2): 60 mg via ORAL
  Filled 2023-02-01 (×2): qty 1

## 2023-02-01 MED ORDER — DIPHENHYDRAMINE HCL 12.5 MG/5ML PO ELIX: 12.5000 mg | ORAL_SOLUTION | Freq: Four times a day (QID) | ORAL | Status: DC | PRN

## 2023-02-01 MED ORDER — SPIRONOLACTONE 25 MG PO TABS
25.0000 mg | ORAL_TABLET | Freq: Every day | ORAL | Status: DC
  Filled 2023-02-01: qty 1

## 2023-02-01 MED ORDER — PHENYLEPHRINE HCL-NACL 20-0.9 MG/250ML-% IV SOLN
INTRAVENOUS | Status: DC | PRN
  Administered 2023-02-01: 30 ug/min via INTRAVENOUS

## 2023-02-01 MED ORDER — FENTANYL CITRATE PF 50 MCG/ML IJ SOSY
25.0000 ug | PREFILLED_SYRINGE | INTRAMUSCULAR | Status: DC | PRN
Start: 2023-02-01 — End: 2023-02-03

## 2023-02-01 MED ORDER — PROPOFOL 10 MG/ML IV BOLUS
INTRAVENOUS | Status: AC
  Filled 2023-02-01: qty 20

## 2023-02-01 MED ORDER — INSULIN ASPART 100 UNIT/ML IJ SOLN
0.0000 [IU] | Freq: Three times a day (TID) | INTRAMUSCULAR | Status: DC
  Administered 2023-02-02: 5 [IU] via SUBCUTANEOUS
  Administered 2023-02-02: 15 [IU] via SUBCUTANEOUS
  Administered 2023-02-02: 5 [IU] via SUBCUTANEOUS
  Administered 2023-02-03: 2 [IU] via SUBCUTANEOUS

## 2023-02-01 MED ORDER — SODIUM CHLORIDE 0.9 % IV SOLN: INTRAVENOUS | Status: DC | PRN

## 2023-02-01 MED ORDER — METOPROLOL SUCCINATE ER 25 MG PO TB24
25.0000 mg | ORAL_TABLET | Freq: Every day | ORAL | Status: DC
  Administered 2023-02-02 – 2023-02-03 (×2): 25 mg via ORAL
  Filled 2023-02-01 (×2): qty 1

## 2023-02-01 MED ORDER — SODIUM CHLORIDE 0.9 % IV SOLN: INTRAVENOUS | Status: AC

## 2023-02-01 MED ORDER — FENTANYL CITRATE (PF) 100 MCG/2ML IJ SOLN
INTRAMUSCULAR | Status: DC | PRN
  Administered 2023-02-01 (×2): 50 ug via INTRAVENOUS

## 2023-02-01 MED ORDER — SODIUM CHLORIDE 0.9 % IR SOLN
3000.0000 mL | Status: DC
  Administered 2023-02-01 – 2023-02-02 (×10): 3000 mL

## 2023-02-01 SURGICAL SUPPLY — 23 items
BAG DRAIN URO TABLE W/ADPT NS (BAG) ×2 IMPLANT
BAG HAMPER (MISCELLANEOUS) ×2 IMPLANT
BAG URINE DRAIN TURP 4L (OSTOMY) ×2 IMPLANT
CATH FOLEY 3WAY 30CC 22F (CATHETERS) ×2 IMPLANT
CLOTH BEACON ORANGE TIMEOUT ST (SAFETY) ×2 IMPLANT
ELECT REM PT RETURN 9FT ADLT (ELECTROSURGICAL) ×2
ELECTRODE REM PT RTRN 9FT ADLT (ELECTROSURGICAL) ×2 IMPLANT
GLOVE BIO SURGEON STRL SZ8 (GLOVE) ×2 IMPLANT
GLOVE BIOGEL PI IND STRL 7.0 (GLOVE) ×4 IMPLANT
GOWN STRL REUS W/TWL LRG LVL3 (GOWN DISPOSABLE) ×4 IMPLANT
GOWN STRL REUS W/TWL XL LVL3 (GOWN DISPOSABLE) ×2 IMPLANT
IV NS IRRIG 3000ML ARTHROMATIC (IV SOLUTION) ×8 IMPLANT
KIT TURNOVER CYSTO (KITS) ×2 IMPLANT
LOOP CUT BIPOLAR 24F LRG (ELECTROSURGICAL) ×2 IMPLANT
MANIFOLD NEPTUNE II (INSTRUMENTS) ×2 IMPLANT
PACK CYSTO (CUSTOM PROCEDURE TRAY) ×2 IMPLANT
PAD ARMBOARD 7.5X6 YLW CONV (MISCELLANEOUS) ×2 IMPLANT
POSITIONER HEAD 8X9X4 ADT (SOFTGOODS) ×2 IMPLANT
SYR 30ML LL (SYRINGE) ×2 IMPLANT
SYR TOOMEY IRRIG 70ML (MISCELLANEOUS) ×2
SYRINGE TOOMEY IRRIG 70ML (MISCELLANEOUS) ×2 IMPLANT
TOWEL OR 17X26 4PK STRL BLUE (TOWEL DISPOSABLE) ×2 IMPLANT
WATER STERILE IRR 500ML POUR (IV SOLUTION) ×2 IMPLANT

## 2023-02-01 NOTE — Progress Notes (Signed)
Mobility Specialist Progress Note:    02/01/23 1620  Mobility  Activity Dangled on edge of bed  Level of Assistance Minimal assist, patient does 75% or more  Assistive Device None  Range of Motion/Exercises Active;All extremities  Activity Response Tolerated well  Mobility Referral Yes  Mobility visit 1 Mobility  Mobility Specialist Start Time (ACUTE ONLY) 1600  Mobility Specialist Stop Time (ACUTE ONLY) 1620  Mobility Specialist Time Calculation (min) (ACUTE ONLY) 20 min   Pt received in bed, agreeable to mobility. Required MinA to dangle EOB. Tolerated well, pt very fatigued. Returned supine, NT in room. All needs met.   Lawerance Bach Mobility Specialist Please contact via Special educational needs teacher or  Rehab office at [phone]

## 2023-02-01 NOTE — Plan of Care (Signed)
  Problem: Education: Goal: Knowledge of General Education information will improve Description: Including pain rating scale, medication(s)/side effects and non-pharmacologic comfort measures Outcome: Progressing   Problem: Safety: Goal: Ability to remain free from injury will improve Outcome: Progressing   Problem: Fluid Volume: Goal: Ability to maintain a balanced intake and output will improve Outcome: Progressing

## 2023-02-01 NOTE — Progress Notes (Signed)
   02/01/23 1526  TOC Brief Assessment  Insurance and Status Reviewed  Patient has primary care physician Yes  Home environment has been reviewed Single Family Home with spouse  Prior level of function: Independent  Prior/Current Home Services No current home services  Social Drivers of Health Review SDOH reviewed no interventions necessary  Readmission risk has been reviewed Yes  Transition of care needs no transition of care needs at this time     Transition of Care Department [facility]) has reviewed patient and no TOC needs have been identified at this time. We will continue to monitor patient advancement through interdisciplinary progression rounds. If new patient transition needs arise, please place a TOC consult.

## 2023-02-01 NOTE — Anesthesia Preprocedure Evaluation (Signed)
Anesthesia Evaluation  Patient identified by MRN, date of birth, ID band Patient awake    Reviewed: Allergy & Precautions, H&P , NPO status , Patient's Chart, lab work & pertinent test results, reviewed documented beta blocker date and time   Airway Mallampati: II  TM Distance: >3 FB Neck ROM: full    Dental no notable dental hx.    Pulmonary neg pulmonary ROS, shortness of breath, sleep apnea , former smoker   Pulmonary exam normal breath sounds clear to auscultation       Cardiovascular Exercise Tolerance: Good hypertension, + angina  + CAD, + Peripheral Vascular Disease and +CHF  negative cardio ROS  Rhythm:regular Rate:Normal     Neuro/Psych TIA Neuromuscular disease CVA negative neurological ROS  negative psych ROS   GI/Hepatic negative GI ROS, Neg liver ROS,,,  Endo/Other  negative endocrine ROSdiabetesHypothyroidism    Renal/GU Renal diseasenegative Renal ROS  negative genitourinary   Musculoskeletal   Abdominal   Peds  Hematology negative hematology ROS (+)   Anesthesia Other Findings   Reproductive/Obstetrics negative OB ROS                             Anesthesia Physical Anesthesia Plan  ASA: 2  Anesthesia Plan: General and General LMA   Post-op Pain Management:    Induction:   PONV Risk Score and Plan: Ondansetron  Airway Management Planned:   Additional Equipment:   Intra-op Plan:   Post-operative Plan:   Informed Consent: I have reviewed the patients History and Physical, chart, labs and discussed the procedure including the risks, benefits and alternatives for the proposed anesthesia with the patient or authorized representative who has indicated his/her understanding and acceptance.     Dental Advisory Given  Plan Discussed with: CRNA  Anesthesia Plan Comments:        Anesthesia Quick Evaluation

## 2023-02-01 NOTE — Transfer of Care (Signed)
Immediate Anesthesia Transfer of Care Note  Patient: Dylan Robles.  Procedure(s) Performed: CYSTOSCOPY (Bladder) TRANSURETHRAL RESECTION OF THE PROSTATE (TURP) (Prostate)  Patient Location: PACU  Anesthesia Type:General  Level of Consciousness: awake, alert , oriented, and patient cooperative  Airway & Oxygen Therapy: Patient Spontanous Breathing and Patient connected to face mask oxygen  Post-op Assessment: Report given to RN, Post -op Vital signs reviewed and stable, and Patient moving all extremities X 4  Post vital signs: Reviewed and stable  Last Vitals:  Vitals Value Taken Time  BP 149/81 02/01/23 1001  Temp 37.2 C 02/01/23 1001  Pulse 60 02/01/23 1006  Resp 13 02/01/23 1006  SpO2 100 % 02/01/23 1006  Vitals shown include unfiled device data.  Last Pain:  Vitals:   02/01/23 0714  TempSrc: Oral  PainSc: 0-No pain         Complications: No notable events documented.

## 2023-02-01 NOTE — Anesthesia Procedure Notes (Signed)
Procedure Name: LMA Insertion Date/Time: 02/01/2023 8:56 AM  Performed by: Shanon Payor, CRNAPre-anesthesia Checklist: Patient identified, Emergency Drugs available, Suction available, Patient being monitored and Timeout performed Patient Re-evaluated:Patient Re-evaluated prior to induction Oxygen Delivery Method: Circle system utilized Preoxygenation: Pre-oxygenation with 100% oxygen Induction Type: IV induction LMA: LMA inserted LMA Size: 5.0 Number of attempts: 1 Placement Confirmation: positive ETCO2, CO2 detector and breath sounds checked- equal and bilateral Tube secured with: Tape Dental Injury: Teeth and Oropharynx as per pre-operative assessment

## 2023-02-01 NOTE — Op Note (Signed)
Preoperative diagnosis: BPH  Postoperative diagnosis: BPH  Procedure: 1 cystoscopy 2. Transurethral resection of the prostate  Attending: Wilkie Aye  Anesthesia: General  Estimated blood loss: Minimal  Drains: 22 French foley  Specimens: 1. Prostate Chips  Antibiotics: Rocephin  Findings: Trilobar prostate enlargement. Ureteral orifices in normal anatomic location.   Indications: Patient is a 83 year old male with a history of BPH and urinary retention managed with a foley catheter.  After discussing treatment options, they decided proceed with transurethral resection of the prostate.  Procedure in detail: The patient was brought to the operating room and a brief timeout was done to ensure correct patient, correct procedure, correct site.  General anesthesia was administered patient was placed in dorsal lithotomy position.  Their genitalia was then prepped and draped in usual sterile fashion.  A rigid 22 French cystoscope was passed in the urethra and the bladder.  Bladder was inspected and we noted no masses or lesions.  the ureteral orifices were in the normal orthotopic locations. removed the cystoscope and placed a resectoscope into the bladder. We then turned our attention to the prostate resection. Using the bipolar resectoscope we resected the median lobe first from the bladder neck to the verumontanum. We then started at the 12 oclock position on the left lobe and resection to the 6 o'clock position from the bladder neck to the verumontanum. We then did the same resection of the right lobe. Once the resection was complete we then cauterized individual bleeders. We then removed the prostate chips and sent them for pathology.  We then re-inspected the prostatic fossa and found no residual bleeding.  the bladder was then drained, a 22 French foley was placed and this concluded the procedure which was well tolerated by patient.  Complications: None  Condition: Stable, extubated,  transferred to PACU  Plan: Patient is admitted overnight with continuous bladder irrigation. If their urine is clear tomorrow they will be discharged home and followup in 5 days for foley catheter removal and pathology discussion.

## 2023-02-01 NOTE — H&P (Signed)
History of Present Illness: 83 yo male is here TURP.  He has been catheter dependent since his CVA that occurred in August of this year.  Prior to that he had been on tamsulosin.   He did have CT abdomen pelvis from May/2023 that revealed a large prostate.           Past Medical History:  Diagnosis Date   Acute lower UTI 09/20/2017   Bradycardia on ECG 07/31/2011   Coronary artery disease      hyperlipidemia   Decreased GFR 09/23/2019   Dehydration 09/21/2017   Diabetes mellitus     Diabetes mellitus without complication (HCC)     Hypercholesterolemia     Hypertension     Hypothyroidism     Left arm pain 01/29/2020   Paronychia of great toe of left foot 09/26/2018   Paronychia of great toe, left 09/26/2018   PERIPHERAL NEUROPATHY 01/13/2006    Qualifier: Diagnosis of  By: Jen Mow MD, Christine     Shortness of breath dyspnea      with exertion   Vasovagal syncope     Weakness 09/20/2017               Past Surgical History:  Procedure Laterality Date   APPENDECTOMY       CARDIAC CATHETERIZATION N/A 06/16/2014    Procedure: Left Heart Cath;  Surgeon: Laurier Nancy, MD;  Location: [facility] INVASIVE CV LAB;  Service: Cardiovascular;  Laterality: N/A;   CIRCUMCISION, NON-NEWBORN       HERNIA REPAIR       LOWER EXTREMITY ANGIOGRAPHY Left 12/24/2018    Procedure: LOWER EXTREMITY ANGIOGRAPHY;  Surgeon: Renford Dills, MD;  Location: [facility] INVASIVE CV LAB;  Service: Cardiovascular;  Laterality: Left;   THYROID SURGERY              Home Medications:  Allergies as of 12/05/2022         Reactions    Plavix [clopidogrel] Hives            Medication List           Accurate as of December 04, 2022  2:29 PM. If you have any questions, ask your nurse or doctor.              apixaban 2.5 MG Tabs tablet Commonly known as: ELIQUIS Take 1 tablet (2.5 mg total) by mouth 2 (two) times daily.    atorvastatin 80 MG tablet Commonly known as: LIPITOR TAKE 1 TABLET EVERY DAY     cetirizine 5 MG tablet Commonly known as: ZYRTEC TAKE 1 TABLET EVERY DAY    ezetimibe 10 MG tablet Commonly known as: ZETIA TAKE 1 TABLET EVERY DAY    Fish Oil-Vitamin D 1000-1000 MG-UNIT Caps Take by mouth once. daily    FreeStyle Libre 2 Sensor Misc Use to check glucose at least 4 times daily    furosemide 20 MG tablet Commonly known as: LASIX Take 20 mg by mouth as needed.    gabapentin 300 MG capsule Commonly known as: NEURONTIN Take 1 capsule (300 mg total) by mouth 3 (three) times daily.    Gvoke HypoPen 2-Pack 1 MG/0.2ML Soaj Generic drug: Glucagon Inject 1 mg into the skin daily as needed.    isosorbide mononitrate 60 MG 24 hr tablet Commonly known as: IMDUR Take 1 tablet by mouth once daily    levothyroxine 88 MCG tablet Commonly known as: SYNTHROID TAKE 1 TABLET EVERY DAY BEFORE BREAKFAST    metoprolol  succinate 50 MG 24 hr tablet Commonly known as: TOPROL-XL TAKE 1 TABLET EVERY DAY    NovoLOG FlexPen 100 UNIT/ML FlexPen Generic drug: insulin aspart Inject 10-16 Units into the skin 3 (three) times daily with meals.    spironolactone 25 MG tablet Commonly known as: Aldactone Take 1 tablet (25 mg total) by mouth daily.    tamsulosin 0.4 MG Caps capsule Commonly known as: FLOMAX Take 1 capsule (0.4 mg total) by mouth in the morning and at bedtime.    Evaristo Bury FlexTouch 100 UNIT/ML FlexTouch Pen Generic drug: insulin degludec Inject 20 Units into the skin at bedtime.    triamcinolone cream 0.1 % Commonly known as: KENALOG Apply 1 Application topically 2 (two) times daily.             Allergies:  Allergies      Allergies  Allergen Reactions   Plavix [Clopidogrel] Hives             Family History  Problem Relation Age of Onset   Diabetes Father     Hypertension Father     Cancer Mother     Diabetes Brother            Social History:  reports that he has quit smoking. He has never used smokeless tobacco. He reports that he does not  drink alcohol and does not use drugs.   ROS: A complete review of systems was performed.  All systems are negative except for pertinent findings as noted.   Physical Exam:  Vital signs in last 24 hours: There were no vitals taken for this visit. Constitutional:  Alert and oriented, No acute distress Cardiovascular: Regular rate  Respiratory: Normal respiratory effort Neurologic: Grossly intact, no focal deficits Psychiatric: Normal mood and affect       I have reviewed prior pt notes   I have reviewed notes from referring/previous physicians   I have reviewed urinalysis results   I have independently reviewed prior imaging   I have reviewed prior PSA results   I have reviewed prior urine culture     Impression/Assessment:  BPH with obstruction.  With his very large median lobe, I do not think at this point he is a good candidate for a voiding trial   Plan:  We discussed the management of his BPH including continued medical therapy, Rezum, Urolift, TURP and simple prostatectomy. After discussing the options the patient has elected to proceed with TURP. Risks/benefits/alternatives discussed.

## 2023-02-02 ENCOUNTER — Encounter (HOSPITAL_COMMUNITY): Payer: Self-pay | Admitting: Urology

## 2023-02-02 DIAGNOSIS — E7800 Pure hypercholesterolemia, unspecified: Secondary | ICD-10-CM | POA: Diagnosis not present

## 2023-02-02 DIAGNOSIS — Z794 Long term (current) use of insulin: Secondary | ICD-10-CM | POA: Diagnosis not present

## 2023-02-02 DIAGNOSIS — E1142 Type 2 diabetes mellitus with diabetic polyneuropathy: Secondary | ICD-10-CM | POA: Diagnosis not present

## 2023-02-02 DIAGNOSIS — E039 Hypothyroidism, unspecified: Secondary | ICD-10-CM | POA: Diagnosis not present

## 2023-02-02 DIAGNOSIS — D72829 Elevated white blood cell count, unspecified: Secondary | ICD-10-CM | POA: Diagnosis not present

## 2023-02-02 DIAGNOSIS — R310 Gross hematuria: Secondary | ICD-10-CM | POA: Diagnosis not present

## 2023-02-02 DIAGNOSIS — Z79899 Other long term (current) drug therapy: Secondary | ICD-10-CM | POA: Diagnosis not present

## 2023-02-02 DIAGNOSIS — I10 Essential (primary) hypertension: Secondary | ICD-10-CM | POA: Diagnosis not present

## 2023-02-02 DIAGNOSIS — Z833 Family history of diabetes mellitus: Secondary | ICD-10-CM | POA: Diagnosis not present

## 2023-02-02 DIAGNOSIS — Z8673 Personal history of transient ischemic attack (TIA), and cerebral infarction without residual deficits: Secondary | ICD-10-CM | POA: Diagnosis not present

## 2023-02-02 DIAGNOSIS — N400 Enlarged prostate without lower urinary tract symptoms: Secondary | ICD-10-CM

## 2023-02-02 DIAGNOSIS — Z7901 Long term (current) use of anticoagulants: Secondary | ICD-10-CM | POA: Diagnosis not present

## 2023-02-02 DIAGNOSIS — Z79890 Hormone replacement therapy (postmenopausal): Secondary | ICD-10-CM | POA: Diagnosis not present

## 2023-02-02 DIAGNOSIS — Z8249 Family history of ischemic heart disease and other diseases of the circulatory system: Secondary | ICD-10-CM | POA: Diagnosis not present

## 2023-02-02 DIAGNOSIS — N401 Enlarged prostate with lower urinary tract symptoms: Secondary | ICD-10-CM | POA: Diagnosis not present

## 2023-02-02 DIAGNOSIS — Z809 Family history of malignant neoplasm, unspecified: Secondary | ICD-10-CM | POA: Diagnosis not present

## 2023-02-02 DIAGNOSIS — Z888 Allergy status to other drugs, medicaments and biological substances status: Secondary | ICD-10-CM | POA: Diagnosis not present

## 2023-02-02 DIAGNOSIS — G4730 Sleep apnea, unspecified: Secondary | ICD-10-CM | POA: Diagnosis not present

## 2023-02-02 DIAGNOSIS — N138 Other obstructive and reflux uropathy: Secondary | ICD-10-CM | POA: Diagnosis not present

## 2023-02-02 DIAGNOSIS — I2510 Atherosclerotic heart disease of native coronary artery without angina pectoris: Secondary | ICD-10-CM | POA: Diagnosis not present

## 2023-02-02 DIAGNOSIS — E875 Hyperkalemia: Secondary | ICD-10-CM | POA: Diagnosis not present

## 2023-02-02 DIAGNOSIS — Z87891 Personal history of nicotine dependence: Secondary | ICD-10-CM | POA: Diagnosis not present

## 2023-02-02 LAB — BASIC METABOLIC PANEL
Anion gap: 8 (ref 5–15)
BUN: 38 mg/dL — ABNORMAL HIGH (ref 8–23)
CO2: 19 mmol/L — ABNORMAL LOW (ref 22–32)
Calcium: 8.6 mg/dL — ABNORMAL LOW (ref 8.9–10.3)
Chloride: 103 mmol/L (ref 98–111)
Creatinine, Ser: 2.44 mg/dL — ABNORMAL HIGH (ref 0.61–1.24)
GFR, Estimated: 25 mL/min — ABNORMAL LOW (ref >60)
Glucose, Bld: 219 mg/dL — ABNORMAL HIGH (ref 70–99)
Potassium: 6.3 mmol/L (ref 3.5–5.1)
Sodium: 130 mmol/L — ABNORMAL LOW (ref 135–145)

## 2023-02-02 LAB — CBC
HCT: 32.2 % — ABNORMAL LOW (ref 39.0–52.0)
Hemoglobin: 10.3 g/dL — ABNORMAL LOW (ref 13.0–17.0)
MCH: 29.5 pg (ref 26.0–34.0)
MCHC: 32 g/dL (ref 30.0–36.0)
MCV: 92.3 fL (ref 80.0–100.0)
Platelets: 152 10*3/uL (ref 150–400)
RBC: 3.49 MIL/uL — ABNORMAL LOW (ref 4.22–5.81)
RDW: 15.2 % (ref 11.5–15.5)
WBC: 16.6 10*3/uL — ABNORMAL HIGH (ref 4.0–10.5)
nRBC: 0 % (ref 0.0–0.2)

## 2023-02-02 LAB — SURGICAL PATHOLOGY

## 2023-02-02 LAB — GLUCOSE, CAPILLARY
Glucose-Capillary: 146 mg/dL — ABNORMAL HIGH (ref 70–99)
Glucose-Capillary: 206 mg/dL — ABNORMAL HIGH (ref 70–99)
Glucose-Capillary: 234 mg/dL — ABNORMAL HIGH (ref 70–99)
Glucose-Capillary: 353 mg/dL — ABNORMAL HIGH (ref 70–99)

## 2023-02-02 LAB — POTASSIUM
Potassium: 5.1 mmol/L (ref 3.5–5.1)
Potassium: 4.6 mmol/L (ref 3.5–5.1)

## 2023-02-02 LAB — PROCALCITONIN: Procalcitonin: 52.84 ng/mL

## 2023-02-02 MED ORDER — SODIUM CHLORIDE 0.9 % IV BOLUS
500.0000 mL | Freq: Once | INTRAVENOUS | Status: AC
  Administered 2023-02-02: 500 mL via INTRAVENOUS

## 2023-02-02 MED ORDER — SODIUM ZIRCONIUM CYCLOSILICATE 10 G PO PACK
10.0000 g | PACK | Freq: Once | ORAL | Status: AC
  Administered 2023-02-02: 10 g via ORAL
  Filled 2023-02-02: qty 1

## 2023-02-02 MED ORDER — CHLORHEXIDINE GLUCONATE CLOTH 2 % EX PADS
6.0000 | MEDICATED_PAD | Freq: Every day | CUTANEOUS | Status: DC
  Administered 2023-02-02: 6 via TOPICAL

## 2023-02-02 MED ORDER — CIPROFLOXACIN IN D5W 400 MG/200ML IV SOLN
400.0000 mg | INTRAVENOUS | Status: DC
  Administered 2023-02-02 – 2023-02-03 (×2): 400 mg via INTRAVENOUS
  Filled 2023-02-02 (×2): qty 200

## 2023-02-02 NOTE — Progress Notes (Signed)
RN called due to increasing WBC within the last 24 hours.  Lab was reviewed and it was noted that patient has hyperkalemia with potassium of 6.3 which is critical. EKG was ordered, IV NS 500 mL and Lokelma were ordered Continue to trend potassium level and adjust dose as needed  Regarding leukocytosis, this may be due to stress demargination considering no acute infectious process noted at this time.  Patient appeared to have received prophylactic antibiotics prior to TURP  BP 115/82 (BP Location: Left Arm)   Pulse 72   Temp 98.7 F (37.1 C)   Resp 18   Ht 6\' 6"  (1.981 m)   Wt 110.4 kg   SpO2 100%   BMI 28.13 kg/m   Procalcitonin will be checked prior to starting any antibiotics Continue to monitor for an ongoing infectious process and consider starting antibiotics at that time

## 2023-02-02 NOTE — Inpatient Diabetes Management (Signed)
Inpatient Diabetes Program Recommendations  AACE/ADA: New Consensus Statement on Inpatient Glycemic Control (2015)  Target Ranges:  Prepandial:   less than 140 mg/dL      Peak postprandial:   less than 180 mg/dL (1-2 hours)      Critically ill patients:  140 - 180 mg/dL   Lab Results  Component Value Date   GLUCAP 206 (H) 02/02/2023   HGBA1C 10.2 (A) 01/26/2023    Latest Reference Range & Units 02/01/23 07:19 02/01/23 10:04 02/01/23 14:02 02/01/23 16:23 02/01/23 20:37 02/02/23 07:36  Glucose-Capillary 70 - 99 mg/dL 409 (H) 811 (H) 914 (H) 104 (H) 146 (H) 206 (H)  (H): Data is abnormally high  Diabetes history: DM2 Outpatient Diabetes medications: Tresiba 20 units SQ nightly and adjust his Novolog to 5-11 units TID  Current orders for Inpatient glycemic control: Novolog 0-15 units tid, 0-5 units hs  Inpatient Diabetes Program Recommendations:   Patient sees Ronny Bacon, NP for endocrinology with last office visit 01/26/23. Please consider: -Add Semglee 10 units daily -Decrease Novolog correction to 0-9 units tid, 0-5 units hs -Add Novolog meal coverage as needed  Thank you, Darel Hong E. Kiante Ciavarella, RN, MSN, CDCES  Diabetes Coordinator Inpatient Glycemic Control Team Team Pager [phone] (8am-5pm) 02/02/2023 11:39 AM

## 2023-02-02 NOTE — Progress Notes (Signed)
1 Day Post-Op Subjective: Patient reports mild suprapubic pain. Urine clear off CBI. Febrile to 101 last night and started on cipro. Patient feeling better this afternoon  Objective: Vital signs in last 24 hours: Temp:  [97.4 F (36.3 C)-99.3 F (37.4 C)] 97.5 F (36.4 C) (01/24 2000) Pulse Rate:  [72-84] 76 (01/24 2000) Resp:  [18-20] 18 (01/24 2000) BP: (84-115)/(50-82) 115/72 (01/24 2000) SpO2:  [97 %-100 %] 97 % (01/24 2000)  Intake/Output from previous day: 01/23 0701 - 01/24 0700 In: 5093.9 [P.O.:240; I.V.:1053.7; IV Piggyback:800.2] Out: [address] [Urine:33050] Intake/Output this shift: Total I/O In: -  Out: 2400 [Urine:2400]  Physical Exam:  General:alert, cooperative, and appears stated age GI: soft, non tender, normal bowel sounds, no palpable masses, no organomegaly, no inguinal hernia Male genitalia: not done Extremities: extremities normal, atraumatic, no cyanosis or edema  Lab Results: Recent Labs    02/01/23 1109 02/02/23 0418  HGB 10.9* 10.3*  HCT 34.6* 32.2*   BMET Recent Labs    02/01/23 1109 02/02/23 0418 02/02/23 0923 02/02/23 1302  NA 138 130*  --   --   K 4.6 6.3* 5.1 4.6  CL 107 103  --   --   CO2 23 19*  --   --   GLUCOSE 142* 219*  --   --   BUN 29* 38*  --   --   CREATININE 1.72* 2.44*  --   --   CALCIUM 9.0 8.6*  --   --    No results for input(s): "LABPT", "INR" in the last 72 hours. No results for input(s): "LABURIN" in the last 72 hours. Results for orders placed or performed during the hospital encounter of 12/31/20  Resp Panel by RT-PCR (Flu A&B, Covid) Nasopharyngeal Swab     Status: Abnormal   Collection Time: 12/31/20  7:32 PM   Specimen: Nasopharyngeal Swab; Nasopharyngeal(NP) swabs in vial transport medium  Result Value Ref Range Status   SARS Coronavirus 2 by RT PCR POSITIVE (A) NEGATIVE Final    Comment: (NOTE) SARS-CoV-2 target nucleic acids are DETECTED.  The SARS-CoV-2 RNA is generally detectable in upper  respiratory specimens during the acute phase of infection. Positive results are indicative of the presence of the identified virus, but do not rule out bacterial infection or co-infection with other pathogens not detected by the test. Clinical correlation with patient history and other diagnostic information is necessary to determine patient infection status. The expected result is Negative.  Fact Sheet for Patients: BloggerCourse.com  Fact Sheet for Healthcare Providers: SeriousBroker.it  This test is not yet approved or cleared by the [place] FDA and  has been authorized for detection and/or diagnosis of SARS-CoV-2 by FDA under an Emergency Use Authorization (EUA).  This EUA will remain in effect (meaning this test can be used) for the duration of  the COVID-19 declaration under Section 564(b)(1) of the A ct, 21 U.S.C. section 360bbb-3(b)(1), unless the authorization is terminated or revoked sooner.     Influenza A by PCR NEGATIVE NEGATIVE Final   Influenza B by PCR NEGATIVE NEGATIVE Final    Comment: (NOTE) The Xpert Xpress SARS-CoV-2/FLU/RSV plus assay is intended as an aid in the diagnosis of influenza from Nasopharyngeal swab specimens and should not be used as a sole basis for treatment. Nasal washings and aspirates are unacceptable for Xpert Xpress SARS-CoV-2/FLU/RSV testing.  Fact Sheet for Patients: BloggerCourse.com  Fact Sheet for Healthcare Providers: SeriousBroker.it  This test is not yet approved or cleared by the [place]  [place] FDA and has been authorized for detection and/or diagnosis of SARS-CoV-2 by FDA under an Emergency Use Authorization (EUA). This EUA will remain in effect (meaning this test can be used) for the duration of the COVID-19 declaration under Section 564(b)(1) of the Act, 21 U.S.C. section 360bbb-3(b)(1), unless the authorization is  terminated or revoked.  Performed at [facility], [address] [place], [place] [address]     Studies/Results: No results found.  Assessment/Plan: POD#1 TURP CBi discontinued Continue cipro pending urine culture    LOS: 0 days   Wilkie Aye 02/02/2023, 9:05 PM

## 2023-02-02 NOTE — Care Management Obs Status (Signed)
MEDICARE OBSERVATION STATUS NOTIFICATION   Patient Details  Name: Dylan Robles. MRN: [id number] Date of Birth: August 10, 1940   Medicare Observation Status Notification Given:  Yes Pearletha Furl., CMA, verbally reviewed observation notice with Daric Koren and wife [name].  Copy provided.)    Corey Harold 02/02/2023, 11:53 AM

## 2023-02-02 NOTE — Plan of Care (Signed)
  Problem: Education: Goal: Knowledge of General Education information will improve Description: Including pain rating scale, medication(s)/side effects and non-pharmacologic comfort measures Outcome: Progressing   Problem: Health Behavior/Discharge Planning: Goal: Ability to manage health-related needs will improve Outcome: Progressing   Problem: Clinical Measurements: Goal: Ability to maintain clinical measurements within normal limits will improve Outcome: Progressing Goal: Will remain free from infection Outcome: Progressing   Problem: Activity: Goal: Risk for activity intolerance will decrease Outcome: Progressing   Problem: Nutrition: Goal: Adequate nutrition will be maintained Outcome: Progressing   Problem: Coping: Goal: Level of anxiety will decrease Outcome: Progressing   Problem: Elimination: Goal: Will not experience complications related to bowel motility Outcome: Progressing   Problem: Safety: Goal: Ability to remain free from injury will improve Outcome: Progressing   Problem: Skin Integrity: Goal: Risk for impaired skin integrity will decrease Outcome: Progressing   Problem: Education: Goal: Ability to describe self-care measures that may prevent or decrease complications (Diabetes Survival Skills Education) will improve Outcome: Progressing   Problem: Coping: Goal: Ability to adjust to condition or change in health will improve Outcome: Progressing   Problem: Fluid Volume: Goal: Ability to maintain a balanced intake and output will improve Outcome: Progressing   Problem: Health Behavior/Discharge Planning: Goal: Ability to identify and utilize available resources and services will improve Outcome: Progressing   Problem: Metabolic: Goal: Ability to maintain appropriate glucose levels will improve Outcome: Progressing   Problem: Education: Goal: Knowledge of the prescribed therapeutic regimen will improve Outcome: Progressing   Problem:  Health Behavior/Discharge Planning: Goal: Identification of resources available to assist in meeting health care needs will improve Outcome: Progressing   Problem: Urinary Elimination: Goal: Ability to avoid or minimize complications of infection will improve Outcome: Progressing

## 2023-02-03 LAB — CREATININE, SERUM
Creatinine, Ser: 2.29 mg/dL — ABNORMAL HIGH (ref 0.61–1.24)
GFR, Estimated: 27 mL/min — ABNORMAL LOW (ref >60)

## 2023-02-03 LAB — GLUCOSE, CAPILLARY
Glucose-Capillary: 96 mg/dL (ref 70–99)
Glucose-Capillary: 150 mg/dL — ABNORMAL HIGH (ref 70–99)

## 2023-02-03 MED ORDER — TRAMADOL HCL 50 MG PO TABS: 50.0000 mg | ORAL_TABLET | Freq: Four times a day (QID) | ORAL | 0 refills | Status: DC | PRN

## 2023-02-03 MED ORDER — CIPROFLOXACIN HCL 500 MG PO TABS: 500.0000 mg | ORAL_TABLET | Freq: Every day | ORAL | 0 refills | Status: AC

## 2023-02-03 NOTE — Plan of Care (Signed)
  Problem: Clinical Measurements: Goal: Ability to maintain clinical measurements within normal limits will improve Outcome: Progressing Goal: Will remain free from infection Outcome: Progressing Goal: Diagnostic test results will improve Outcome: Progressing Goal: Cardiovascular complication will be avoided Outcome: Progressing   Problem: Elimination: Goal: Will not experience complications related to bowel motility Outcome: Progressing Goal: Will not experience complications related to urinary retention Outcome: Progressing   Problem: Safety: Goal: Ability to remain free from injury will improve Outcome: Progressing   Problem: Skin Integrity: Goal: Risk for impaired skin integrity will decrease Outcome: Progressing   Problem: Education: Goal: Ability to describe self-care measures that may prevent or decrease complications (Diabetes Survival Skills Education) will improve Outcome: Progressing Goal: Individualized Educational Video(s) Outcome: Progressing   Problem: Coping: Goal: Ability to adjust to condition or change in health will improve Outcome: Progressing   Problem: Fluid Volume: Goal: Ability to maintain a balanced intake and output will improve Outcome: Progressing   Problem: Metabolic: Goal: Ability to maintain appropriate glucose levels will improve Outcome: Progressing   Problem: Skin Integrity: Goal: Risk for impaired skin integrity will decrease Outcome: Progressing   Problem: Education: Goal: Knowledge of the prescribed therapeutic regimen will improve Outcome: Progressing   Problem: Bowel/Gastric: Goal: Gastrointestinal status for postoperative course will improve Outcome: Progressing   Problem: Skin Integrity: Goal: Demonstration of wound healing without infection will improve Outcome: Progressing   Problem: Urinary Elimination: Goal: Ability to avoid or minimize complications of infection will improve Outcome: Progressing

## 2023-02-05 ENCOUNTER — Other Ambulatory Visit: Payer: Self-pay

## 2023-02-05 ENCOUNTER — Telehealth: Payer: Self-pay | Admitting: *Deleted

## 2023-02-05 ENCOUNTER — Other Ambulatory Visit (HOSPITAL_COMMUNITY): Payer: Self-pay

## 2023-02-05 LAB — GLUCOSE, CAPILLARY: Glucose-Capillary: 160 mg/dL — ABNORMAL HIGH (ref 70–99)

## 2023-02-05 NOTE — Transitions of Care (Post Inpatient/ED Visit) (Signed)
02/05/2023  Name: Dylan Robles. MRN: [id number] DOB: 20-Nov-1940  Today's TOC FU Call Status: Today's TOC FU Call Status:: Successful TOC FU Call Completed Patient's Name and Date of Birth confirmed.  Transition Care Management Follow-up Telephone Call Date of Discharge: 02/03/23 [facility] Type of Discharge: Inpatient Admission Primary Inpatient Discharge Diagnosis:: BPH (benign prostatic hyperplasia How have you been since you were released from the hospital?: Better Any questions or concerns?: No  Items Reviewed: Did you receive and understand the discharge instructions provided?: Yes Medications obtained,verified, and reconciled?: Yes (Medications Reviewed) Any new allergies since your discharge?: No Dietary orders reviewed?: No Do you have support at home?: Yes People in Home: spouse Name of Support/Comfort Primary Source: [name]  Medications Reviewed Today: Medications Reviewed Today     Reviewed by Luella Cook, RN (Case Manager) on 02/05/23 at 1624  Med List Status: <None>   Medication Order Taking? Sig Documenting Provider Last Dose Status Informant  apixaban (ELIQUIS) 2.5 MG TABS tablet [id number] Yes Take 1 tablet (2.5 mg total) by mouth 2 (two) times daily. Laurier Nancy, MD Taking Active Spouse/Significant Other  atorvastatin (LIPITOR) 80 MG tablet [id number] Yes Take 1 tablet (80 mg total) by mouth daily. Laurier Nancy, MD Taking Active Spouse/Significant Other  cetirizine (ZYRTEC) 5 MG tablet [id number] Yes Take 1 tablet (5 mg total) by mouth daily. Billie Lade, MD Taking Active Spouse/Significant Other  ciprofloxacin (CIPRO) 500 MG tablet [id number] Yes Take 1 tablet (500 mg total) by mouth daily for 10 days. McKenzie, Mardene Celeste, MD Taking Active   Continuous Blood Gluc Sensor (FREESTYLE LIBRE 2 SENSOR) [place] [id number] Yes Use to check glucose at least 4 times daily Lurlean Leyden, Freddi Starr, NP Taking Active Spouse/Significant  Other  Continuous Glucose Sensor (FREESTYLE LIBRE 3 PLUS SENSOR) MISC [id number] Yes Change sensor every 15 days. [name], NP Taking Active   ezetimibe (ZETIA) 10 MG tablet [id number] Yes Take 1 tablet (10 mg total) by mouth daily. Laurier Nancy, MD Taking Active Spouse/Significant Other  Fish Oil-Vitamin D 1000-1000 MG-UNIT CAPS [id number] Yes Take 1 capsule by mouth daily. [provider] Taking Active Spouse/Significant Other  furosemide (LASIX) 20 MG tablet [id number] Yes Take 20 mg by mouth daily as needed for edema or fluid. [provider] Taking Active Spouse/Significant Other  gabapentin (NEURONTIN) 300 MG capsule [id number] Yes Take 1 capsule (300 mg total) by mouth 3 (three) times daily. [name], NP Taking Active Spouse/Significant Other  insulin aspart (NOVOLOG) 100 UNIT/ML FlexPen [id number] Yes Inject 10-16 Units into the skin 3 (three) times daily with meals. [name], NP Taking Active   insulin degludec (TRESIBA FLEXTOUCH) 100 UNIT/ML FlexTouch Pen [id number] Yes Inject 20 Units into the skin at bedtime. [name], NP Taking Active            Med Note Hart Rochester, MARY A   Thu Feb 01, 2023  7:25 AM) Rochele Pages half a dose last evening  isosorbide mononitrate (IMDUR) 30 MG 24 hr tablet [id number] Yes Take 1 tablet (30 mg total) by mouth daily. Billie Lade, MD Taking Active Spouse/Significant Other  isosorbide mononitrate (IMDUR) 60 MG 24 hr tablet [id number] Yes Take 1 tablet by mouth once daily Billie Lade, MD Taking Active Spouse/Significant Other  levothyroxine (SYNTHROID) 88 MCG tablet [id number] Yes Take 1 tablet (88 mcg total) by mouth daily before breakfast. [name], NP Taking Active   metoprolol succinate (TOPROL XL)  50 MG 24 hr tablet [id number] Yes Take 1 tablet (50 mg total) by mouth daily. Laurier Nancy, MD Taking Active Spouse/Significant Other  metoprolol succinate (TOPROL-XL) 25 MG 24 hr tablet [id number] Yes  Take 1 tablet (25 mg total) by mouth daily. Laurier Nancy, MD Taking Active   spironolactone (ALDACTONE) 25 MG tablet [id number] Yes Take 1 tablet (25 mg total) by mouth daily. Laurier Nancy, MD Taking Active Spouse/Significant Other  tamsulosin (FLOMAX) 0.4 MG CAPS capsule [id number] Yes Take 1 capsule (0.4 mg total) by mouth in the morning and at bedtime. Donnita Falls, FNP Taking Active Spouse/Significant Other  traMADol (ULTRAM) 50 MG tablet [id number] Yes Take 1 tablet (50 mg total) by mouth every 6 (six) hours as needed. McKenzie, Mardene Celeste, MD Taking Active   triamcinolone cream (KENALOG) 0.1 % [id number] Yes apply cream externally to affected area twice dailiy. Billie Lade, MD Taking Active             Home Care and Equipment/Supplies: Were Home Health Services Ordered?: NA Any new equipment or medical supplies ordered?: NA  Functional Questionnaire: Do you need assistance with bathing/showering or dressing?: No Do you need assistance with meal preparation?: No Do you need assistance with eating?: No Do you have difficulty maintaining continence: No Do you need assistance with getting out of bed/getting out of a chair/moving?: No Do you have difficulty managing or taking your medications?: No  Follow up appointments reviewed: PCP Follow-up appointment confirmed?: Yes Date of PCP follow-up appointment?: 03/06/23 Follow-up Provider: Dr Christel Mormon Specialist [facility] Follow-up appointment confirmed?: Yes Date of Specialist follow-up appointment?: 02/06/23 Follow-Up Specialty Provider:: Diona Fanti Do you need transportation to your follow-up appointment?: No Do you understand care options if your condition(s) worsen?: Yes-patient verbalized understanding  SDOH Interventions Today    Flowsheet Row Most Recent Value  SDOH Interventions   Food Insecurity Interventions Intervention Not Indicated  Housing Interventions Intervention Not Indicated  Transportation  Interventions Intervention Not Indicated, Patient Resources (Friends/Family)  Utilities Interventions Intervention Not Indicated      Interventions Today    Flowsheet Row Most Recent Value  Chronic Disease   Chronic disease during today's visit Other, Diabetes  [BPH (benign prostatic hyperplasia]  General Interventions   General Interventions Discussed/Reviewed General Interventions Discussed, General Interventions Reviewed, Referral to Nurse, Doctor Visits  [Referred to [facility] Case Care Management]  Doctor Visits Discussed/Reviewed Doctor Visits Discussed, Doctor Visits Reviewed, PCP, Specialist  PCP/Specialist Visits Compliance with follow-up visit  Pharmacy Interventions   Pharmacy Dicussed/Reviewed Pharmacy Topics Discussed, Pharmacy Topics Reviewed       Goals Addressed             This Visit's Progress    TOC 30 day program       Current Barriers:  Knowledge Deficits related to plan of care for management of DMII and Benign prostatic hyperplasia   RNCM Clinical Goal(s):  Patient will work with the Care Management team over the next 30 days to address Transition of Care Barriers: Medication Management take all medications exactly as prescribed and will call provider for medication related questions as evidenced by EMR attend all scheduled medical appointments: PCP and Specialist as evidenced by EMR  through collaboration with RN Care manager, provider, and care team.   Interventions: Evaluation of current treatment plan related to  self management and patient's adherence to plan as established by provider  Transitions of Care:  New goal. Doctor Visits  - discussed the importance  of doctor visits  Patient Goals/Self-Care Activities: Participate in Transition of Care Program/Attend [facility] scheduled calls Notify RN Care Manager of TOC call rescheduling needs Take all medications as prescribed Attend all scheduled provider appointments Call pharmacy for medication  refills 3-7 days in advance of running out of medications  Follow Up Plan:  Telephone follow up appointment with care management team member scheduled for:  Angelene Daphine Deutscher The patient has been provided with contact information for the care management team and has been advised to call with any health related questions or concerns.         Patient agreed to follow up outreach calls with [facility] [phone] 11:30   Gean Maidens BSN RN [facility] Care Management Coordinator [email or web address] Direct Dial: [phone]  Fax: [phone] Website: [email or web address]

## 2023-02-05 NOTE — Progress Notes (Unsigned)
Name: Dylan Robles. DOB: [birth year] MRN: [id number]  Diagnoses: Post-operative state  HPI: He presents postoperatively. He is accompanied by his wife [name]. - GU history includes:  1. BPH with BOO and LUTS (including incomplete bladder emptying).  - LUTS likely exacerbated by diuretic use for CHF and his diabetes. - Taking Flomax 0.4 mg daily.  He underwent the following procedures by Dr. Ronne Binning on 02/01/2023:  Preoperative diagnosis: BPH   Postoperative diagnosis: BPH   Procedure:  1. cystoscopy 2. Transurethral resection of the prostate  Pathology:  A. PROSTATE CHIPS, TURP:  Prostatic adenocarcinoma, Gleason score 3+3 =6, grade group 1.  Carcinoma involves 5% of parenchymal volume.  Lymphovascular invasion is not identified.  Perineural invasion is not identified.   Postop course: He reports the catheter is draining well.  He denies gross hematuria.  He denies flank pain or abdominal pain. He denies fevers, nausea, or vomiting.  Fall Screening: Do you usually have a device to assist in your mobility? Yes - walker   Medications: Current Outpatient Medications  Medication Sig Dispense Refill   apixaban (ELIQUIS) 2.5 MG TABS tablet Take 1 tablet (2.5 mg total) by mouth 2 (two) times daily. 60 tablet 3   atorvastatin (LIPITOR) 80 MG tablet Take 1 tablet (80 mg total) by mouth daily. 90 tablet 3   cetirizine (ZYRTEC) 5 MG tablet Take 1 tablet (5 mg total) by mouth daily. 90 tablet 3   ciprofloxacin (CIPRO) 500 MG tablet Take 1 tablet (500 mg total) by mouth daily for 10 days. 7 tablet 0   Continuous Blood Gluc Sensor (FREESTYLE LIBRE 2 SENSOR) MISC Use to check glucose at least 4 times daily 6 each 3   Continuous Glucose Sensor (FREESTYLE LIBRE 3 PLUS SENSOR) MISC Change sensor every 15 days. 6 each 3   ezetimibe (ZETIA) 10 MG tablet Take 1 tablet (10 mg total) by mouth daily. 90 tablet 3   Fish Oil-Vitamin D 1000-1000 MG-UNIT CAPS Take 1 capsule by mouth  daily.     furosemide (LASIX) 20 MG tablet Take 20 mg by mouth daily as needed for edema or fluid.     gabapentin (NEURONTIN) 300 MG capsule Take 1 capsule (300 mg total) by mouth 3 (three) times daily. 270 capsule 1   insulin aspart (NOVOLOG) 100 UNIT/ML FlexPen Inject 10-16 Units into the skin 3 (three) times daily with meals. 30 mL 3   insulin degludec (TRESIBA FLEXTOUCH) 100 UNIT/ML FlexTouch Pen Inject 20 Units into the skin at bedtime. 18 mL 3   isosorbide mononitrate (IMDUR) 30 MG 24 hr tablet Take 1 tablet (30 mg total) by mouth daily. 90 tablet 3   isosorbide mononitrate (IMDUR) 60 MG 24 hr tablet Take 1 tablet by mouth once daily 30 tablet 0   levothyroxine (SYNTHROID) 88 MCG tablet Take 1 tablet (88 mcg total) by mouth daily before breakfast. 90 tablet 3   metoprolol succinate (TOPROL XL) 50 MG 24 hr tablet Take 1 tablet (50 mg total) by mouth daily. 90 tablet 3   metoprolol succinate (TOPROL-XL) 25 MG 24 hr tablet Take 1 tablet (25 mg total) by mouth daily. 30 tablet 11   spironolactone (ALDACTONE) 25 MG tablet Take 1 tablet (25 mg total) by mouth daily. 30 tablet 11   tamsulosin (FLOMAX) 0.4 MG CAPS capsule Take 1 capsule (0.4 mg total) by mouth in the morning and at bedtime. 180 capsule 3   traMADol (ULTRAM) 50 MG tablet Take 1 tablet (50 mg total) by  mouth every 6 (six) hours as needed. 15 tablet 0   triamcinolone cream (KENALOG) 0.1 % apply cream externally to affected area twice dailiy. 15 g 1   No current facility-administered medications for this visit.    Allergies: Allergies  Allergen Reactions   Plavix [Clopidogrel] Hives    Past Medical History:  Diagnosis Date   Acute lower UTI 09/20/2017   Bradycardia on ECG 07/31/2011   Coronary artery disease    hyperlipidemia   Decreased GFR 09/23/2019   Dehydration 09/21/2017   Diabetes mellitus    Diabetes mellitus without complication (HCC)    Hypercholesterolemia    Hypertension    Hypothyroidism    Left arm pain  01/29/2020   Paronychia of great toe of left foot 09/26/2018   Paronychia of great toe, left 09/26/2018   PERIPHERAL NEUROPATHY 01/13/2006   Qualifier: Diagnosis of  By: Jen Mow MD, Christine     Shortness of breath dyspnea    with exertion   Sleep apnea    Stroke (HCC) 08/10/2022   Vasovagal syncope    Weakness 09/20/2017   Past Surgical History:  Procedure Laterality Date   APPENDECTOMY     CARDIAC CATHETERIZATION N/A 06/16/2014   Procedure: Left Heart Cath;  Surgeon: Laurier Nancy, MD;  Location: [facility];  Service: Cardiovascular;  Laterality: N/A;   CIRCUMCISION, NON-NEWBORN     CYSTOSCOPY N/A 02/01/2023   Procedure: CYSTOSCOPY;  Surgeon: Malen Gauze, MD;  Location: [facility];  Service: Urology;  Laterality: N/A;   HERNIA REPAIR     LOWER EXTREMITY ANGIOGRAPHY Left 12/24/2018   Procedure: LOWER EXTREMITY ANGIOGRAPHY;  Surgeon: Renford Dills, MD;  Location: [facility];  Service: Cardiovascular;  Laterality: Left;   THYROID SURGERY     TRANSURETHRAL RESECTION OF PROSTATE N/A 02/01/2023   Procedure: TRANSURETHRAL RESECTION OF THE PROSTATE (TURP);  Surgeon: Malen Gauze, MD;  Location: [facility];  Service: Urology;  Laterality: N/A;   Family History  Problem Relation Age of Onset   Diabetes Father    Hypertension Father    Cancer Mother    Diabetes Brother    Social History   Socioeconomic History   Marital status: Married    Spouse name: Not on file   Number of children: Not on file   Years of education: Not on file   Highest education level: Not on file  Occupational History   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  Tobacco Use   Smoking status: Former   Smokeless tobacco: Never  Advertising account planner   Vaping status: Never Used  Substance and Sexual Activity   Alcohol use: No   Drug use: No   Sexual activity: Not on file  Other Topics Concern   Not on file  Social History Narrative   ** Merged History Encounter **       Social  Drivers of Health   Financial Resource Strain: Low Risk  (03/29/2021)   Overall Financial Resource Strain (CARDIA)    Difficulty of Paying Living Expenses: Not very hard  Food Insecurity: No Food Insecurity (02/05/2023)   Hunger Vital Sign    Worried About Running Out of Food in the Last Year: Never true    Ran Out of Food in the Last Year: Never true  Transportation Needs: No Transportation Needs (02/05/2023)   PRAPARE - Administrator, Civil Service (Medical): No    Lack of Transportation (Non-Medical): No  Physical Activity: Insufficiently Active (  03/29/2021)   Exercise Vital Sign    Days of Exercise per Week: 7 days    Minutes of Exercise per Session: 20 min  Stress: No Stress Concern Present (03/29/2021)   [employer] of Occupational Health - Occupational Stress Questionnaire    Feeling of Stress : Not at all  Social Connections: Socially Integrated (02/01/2023)   Social Connection and Isolation Panel [NHANES]    Frequency of Communication with Friends and Family: More than three times a week    Frequency of Social Gatherings with Friends and Family: More than three times a week    Attends Religious Services: More than 4 times per year    Active Member of [employer] or Organizations: Yes    Attends Banker Meetings: 1 to 4 times per year    Marital Status: Married  Catering manager Violence: Not At Risk (02/05/2023)   Humiliation, Afraid, Rape, and Kick questionnaire    Fear of Current or Ex-Partner: No    Emotionally Abused: No    Physically Abused: No    Sexually Abused: No    SUBJECTIVE  Review of Systems Constitutional: Patient denies any unintentional weight loss or change in strength lntegumentary: Patient denies any rashes or pruritus Cardiovascular: Patient denies chest pain or syncope Respiratory: Patient denies shortness of breath Gastrointestinal: Patient denies nausea, vomiting, constipation, or diarrhea Musculoskeletal: Patient denies  muscle cramps or weakness Neurologic: Patient denies convulsions or seizures Allergic/Immunologic: Patient denies recent allergic reaction(s) Hematologic/Lymphatic: Patient denies bleeding tendencies Endocrine: Patient denies heat/cold intolerance  GU: As per HPI.  OBJECTIVE Vitals:   02/06/23 1023  BP: (!) 146/89  Pulse: 75  Temp: 98.7 F (37.1 C)   There is no height or weight on file to calculate BMI.  Physical Examination Constitutional: No obvious distress; patient is non-toxic appearing  Cardiovascular: No visible lower extremity edema.  Respiratory: The patient does not have audible wheezing/stridor; respirations do not appear labored  Gastrointestinal: Abdomen non-distended Musculoskeletal: Normal ROM of UEs  Skin: No obvious rashes/open sores  Neurologic: CN 2-12 grossly intact Psychiatric: Answered questions appropriately with normal affect  Hematologic/Lymphatic/Immunologic: No obvious bruises or sites of spontaneous bleeding   ASSESSMENT Benign prostatic hyperplasia with incomplete bladder emptying - Plan: Bladder Voiding Trial, BLADDER SCAN AMB NON-IMAGING  S/P TURP - Plan: Bladder Voiding Trial, BLADDER SCAN AMB NON-IMAGING  Postop check - Plan: Bladder Voiding Trial, BLADDER SCAN AMB NON-IMAGING  Malignant neoplasm of prostate (HCC) - Plan: Bladder Voiding Trial, PSA, total and free, BLADDER SCAN AMB NON-IMAGING  We reviewed the operative procedure. He passed office voiding trial today; Foley catheter discontinued.   We discussed his pathology results, which were positive for prostate cancer. He is considered to be at low risk for aggressive disease progression based on clinical features. He was advised that Dr. Ronne Binning was consulted and advised follow up with him in 3 months with PSA check prior.   Patient verbalized understanding of and agreement with current plan. All questions were answered.  PLAN Advised the following: Foley catheter  discontinued. Return in about 3 months (around 05/07/2023) for f/u with Dr. Ronne Binning (with lab visit for PSA check at least 1 day prior).  Orders Placed This Encounter  Procedures   PSA, total and free    Standing Status:   Future    Expected Date:   05/07/2023    Expiration Date:   02/06/2024   Bladder Voiding Trial   BLADDER SCAN AMB NON-IMAGING    It has been explained  that the patient is to follow regularly with their PCP in addition to all other providers involved in their care and to follow instructions provided by these respective offices. Patient advised to contact urology clinic if any urologic-pertaining questions, concerns, new symptoms or problems arise in the interim period.  There are no Patient Instructions on file for this visit.  Electronically signed by:  Donnita Falls, MSN, FNP-C, CUNP 02/06/2023 10:56 AM

## 2023-02-06 ENCOUNTER — Encounter: Payer: Self-pay | Admitting: Urology

## 2023-02-06 ENCOUNTER — Ambulatory Visit (INDEPENDENT_AMBULATORY_CARE_PROVIDER_SITE_OTHER): Payer: PPO | Admitting: Urology

## 2023-02-06 VITALS — BP 146/89 | HR 75 | Temp 98.7°F

## 2023-02-06 DIAGNOSIS — Z9079 Acquired absence of other genital organ(s): Secondary | ICD-10-CM

## 2023-02-06 DIAGNOSIS — N401 Enlarged prostate with lower urinary tract symptoms: Secondary | ICD-10-CM

## 2023-02-06 DIAGNOSIS — Z87438 Personal history of other diseases of male genital organs: Secondary | ICD-10-CM | POA: Diagnosis not present

## 2023-02-06 DIAGNOSIS — Z09 Encounter for follow-up examination after completed treatment for conditions other than malignant neoplasm: Secondary | ICD-10-CM

## 2023-02-06 DIAGNOSIS — C61 Malignant neoplasm of prostate: Secondary | ICD-10-CM

## 2023-02-06 NOTE — Progress Notes (Signed)
Fill and Pull Catheter Removal  Patient is present today for a catheter removal.  Patient was cleaned and prepped in a sterile fashion 100 ml of sterile water/ saline was instilled into the bladder  (patient was having multiple bladder spasm) when the patient felt the urge to urinate. 30 ml of water was then drained from the balloon.  A 22 FR 3 way foley cath was removed from the bladder no complications were noted .  Patient as then given some time to void on their own.  Patient can void  100 ml on their own after some time.  Patient tolerated well.  Pt here today for bladder scan. Bladder was scanned and 28 was visualized.   Performed by: Kennyth Lose, CMA  Follow up/ Additional notes: Keep follow up appointment

## 2023-02-09 NOTE — Anesthesia Postprocedure Evaluation (Signed)
Anesthesia Post Note  Patient: Dylan Robles.  Procedure(s) Performed: CYSTOSCOPY (Bladder) TRANSURETHRAL RESECTION OF THE PROSTATE (TURP) (Prostate)  Patient location during evaluation: Phase II Anesthesia Type: General Level of consciousness: awake Pain management: pain level controlled Vital Signs Assessment: post-procedure vital signs reviewed and stable Respiratory status: spontaneous breathing and respiratory function stable Cardiovascular status: blood pressure returned to baseline and stable Postop Assessment: no headache and no apparent nausea or vomiting Anesthetic complications: no Comments: Late entry   No notable events documented.   Last Vitals:  Vitals:   02/03/23 0421 02/03/23 1416  BP: 116/69 127/65  Pulse: 72 65  Resp:  20  Temp: 36.7 C   SpO2: 97% 99%    Last Pain:  Vitals:   02/03/23 1416  TempSrc:   PainSc: 0-No pain                 Windell Norfolk

## 2023-02-13 ENCOUNTER — Other Ambulatory Visit: Payer: Self-pay

## 2023-02-13 NOTE — Discharge Summary (Signed)
Physician Discharge Summary  Patient ID: Dylan Robles. MRN: [id number] DOB/AGE: 85-25-1942 85 y.o.  Admit date: 02/01/2023 Discharge date: 02/03/2023  Admission Diagnoses:  BPH (benign prostatic hyperplasia)  Discharge Diagnoses:  Principal Problem:   BPH (benign prostatic hyperplasia) Active Problems:   Hematuria, gross   Past Medical History:  Diagnosis Date   Acute lower UTI 09/20/2017   Bradycardia on ECG 07/31/2011   Coronary artery disease    hyperlipidemia   Decreased GFR 09/23/2019   Dehydration 09/21/2017   Diabetes mellitus    Diabetes mellitus without complication (HCC)    Hypercholesterolemia    Hypertension    Hypothyroidism    Left arm pain 01/29/2020   Paronychia of great toe of left foot 09/26/2018   Paronychia of great toe, left 09/26/2018   PERIPHERAL NEUROPATHY 01/13/2006   Qualifier: Diagnosis of  By: Jen Mow MD, Christine     Shortness of breath dyspnea    with exertion   Sleep apnea    Stroke (HCC) 08/10/2022   Vasovagal syncope    Weakness 09/20/2017    Surgeries: Procedure(s): CYSTOSCOPY TRANSURETHRAL RESECTION OF THE PROSTATE (TURP) on 02/01/2023   Consultants (if any):   Discharged Condition: Improved  Hospital Course: Federico Maiorino. is an 83 y.o. male who was admitted 02/01/2023 with a diagnosis of BPH (benign prostatic hyperplasia) and went to the operating room on 02/01/2023 and underwent the above named procedures.    He was given perioperative antibiotics:  Anti-infectives (From admission, onward)    Start     Dose/Rate Route Frequency Ordered Stop   02/03/23 0000  ciprofloxacin (CIPRO) 500 MG tablet        500 mg Oral Daily 02/03/23 1055 02/13/23 2359   02/02/23 1200  ciprofloxacin (CIPRO) IVPB 400 mg  Status:  Discontinued        400 mg 200 mL/hr over 60 Minutes Intravenous Every 24 hours 02/02/23 0815 02/03/23 2010   02/01/23 2330  ciprofloxacin (CIPRO) IVPB 400 mg        400 mg 200 mL/hr over 60 Minutes  Intravenous  Once 02/01/23 2233 02/02/23 0049   02/01/23 0648  cefTRIAXone (ROCEPHIN) 2 g in sodium chloride 0.9 % 100 mL IVPB        2 g 200 mL/hr over 30 Minutes Intravenous 30 min pre-op 02/01/23 0648 02/01/23 0916     .  He was given sequential compression devices, early ambulation for DVT prophylaxis.  He benefited maximally from the hospital stay and there were no complications.    Recent vital signs:  Vitals:   02/03/23 0421 02/03/23 1416  BP: 116/69 127/65  Pulse: 72 65  Resp:  20  Temp: 98 F (36.7 C)   SpO2: 97% 99%    Recent laboratory studies:  Lab Results  Component Value Date   HGB 10.3 (L) 02/02/2023   HGB 10.9 (L) 02/01/2023   HGB 11.6 (L) 01/30/2023   Lab Results  Component Value Date   WBC 16.6 (H) 02/02/2023   PLT 152 02/02/2023   Lab Results  Component Value Date   INR 1.1 08/21/2022   Lab Results  Component Value Date   NA 130 (L) 02/02/2023   K 4.6 02/02/2023   CL 103 02/02/2023   CO2 19 (L) 02/02/2023   BUN 38 (H) 02/02/2023   CREATININE 2.29 (H) 02/03/2023   GLUCOSE 219 (H) 02/02/2023    Discharge Medications:   Allergies as of 02/03/2023       Reactions  Plavix [clopidogrel] Hives        Medication List     TAKE these medications    apixaban 2.5 MG Tabs tablet Commonly known as: ELIQUIS Take 1 tablet (2.5 mg total) by mouth 2 (two) times daily.   atorvastatin 80 MG tablet Commonly known as: LIPITOR Take 1 tablet (80 mg total) by mouth daily.   cetirizine 5 MG tablet Commonly known as: ZYRTEC Take 1 tablet (5 mg total) by mouth daily.   ciprofloxacin 500 MG tablet Commonly known as: Cipro Take 1 tablet (500 mg total) by mouth daily for 10 days.   ezetimibe 10 MG tablet Commonly known as: ZETIA Take 1 tablet (10 mg total) by mouth daily.   Fish Oil-Vitamin D 1000-1000 MG-UNIT Caps Take 1 capsule by mouth daily.   FreeStyle Libre 2 Sensor Misc Use to check glucose at least 4 times daily   FreeStyle Libre 3  Plus Sensor Misc Change sensor every 15 days.   furosemide 20 MG tablet Commonly known as: LASIX Take 20 mg by mouth daily as needed for edema or fluid.   gabapentin 300 MG capsule Commonly known as: NEURONTIN Take 1 capsule (300 mg total) by mouth 3 (three) times daily.   isosorbide mononitrate 30 MG 24 hr tablet Commonly known as: IMDUR Take 1 tablet (30 mg total) by mouth daily.   isosorbide mononitrate 60 MG 24 hr tablet Commonly known as: IMDUR Take 1 tablet by mouth once daily   levothyroxine 88 MCG tablet Commonly known as: SYNTHROID Take 1 tablet (88 mcg total) by mouth daily before breakfast.   metoprolol succinate 50 MG 24 hr tablet Commonly known as: Toprol XL Take 1 tablet (50 mg total) by mouth daily.   metoprolol succinate 25 MG 24 hr tablet Commonly known as: TOPROL-XL Take 1 tablet (25 mg total) by mouth daily.   NovoLOG FlexPen 100 UNIT/ML FlexPen Generic drug: insulin aspart Inject 10-16 Units into the skin 3 (three) times daily with meals.   spironolactone 25 MG tablet Commonly known as: Aldactone Take 1 tablet (25 mg total) by mouth daily.   tamsulosin 0.4 MG Caps capsule Commonly known as: FLOMAX Take 1 capsule (0.4 mg total) by mouth in the morning and at bedtime.   traMADol 50 MG tablet Commonly known as: Ultram Take 1 tablet (50 mg total) by mouth every 6 (six) hours as needed.   Evaristo Bury FlexTouch 100 UNIT/ML FlexTouch Pen Generic drug: insulin degludec Inject 20 Units into the skin at bedtime.   triamcinolone cream 0.1 % Commonly known as: KENALOG apply cream externally to affected area twice dailiy.        Diagnostic Studies: No results found.  Disposition: Discharge disposition: 01-Home or Self Care       Discharge Instructions     Discharge patient   Complete by: As directed    Discharge disposition: 01-Home or Self Care   Discharge patient date: 02/03/2023        Follow-up Information     Ceylon Arenson, Mardene Celeste,  MD. Call in 1 week(s).   Specialty: Urology Contact information: [address]  [place] [address] [phone]                  Signed: Wilkie Aye 02/13/2023, 8:10 AM

## 2023-02-13 NOTE — Patient Outreach (Signed)
  Care Management  Transitions of Care Program Transitions of Care Post-discharge week 2  02/13/2023 Name: Dylan Robles. MRN: [id number] DOB: Nov 03, 1940  Subjective: Dylan Robles. is a 83 y.o. year old male who is a primary care patient of Melvenia Manus BRAVO, MD. The Care Management team was unable to reach the patient by phone to assess and address transitions of care needs.   Plan: Additional outreach attempts will be made to reach the patient enrolled in the [facility] Program (Post Inpatient/ED Visit).  Pasco Lunger BSN, Programmer, Systems   Transitions of Care  [facility], [facility] Direct Dial Number: [phone]  Fax: [phone]

## 2023-02-14 ENCOUNTER — Other Ambulatory Visit (HOSPITAL_COMMUNITY): Payer: Self-pay

## 2023-02-15 ENCOUNTER — Other Ambulatory Visit (HOSPITAL_COMMUNITY): Payer: Self-pay

## 2023-02-16 ENCOUNTER — Other Ambulatory Visit (HOSPITAL_COMMUNITY): Payer: Self-pay

## 2023-02-17 MED FILL — Cetirizine HCl Tab 5 MG: ORAL | 90 days supply | Qty: 90 | Fill #0 | Status: AC

## 2023-02-17 MED FILL — Atorvastatin Calcium Tab 80 MG (Base Equivalent): ORAL | 90 days supply | Qty: 90 | Fill #0 | Status: AC

## 2023-02-17 MED FILL — Ezetimibe Tab 10 MG: ORAL | 90 days supply | Qty: 90 | Fill #0 | Status: AC

## 2023-02-18 ENCOUNTER — Other Ambulatory Visit: Payer: Self-pay

## 2023-02-18 ENCOUNTER — Other Ambulatory Visit (HOSPITAL_COMMUNITY): Payer: Self-pay

## 2023-02-19 ENCOUNTER — Other Ambulatory Visit (HOSPITAL_COMMUNITY): Payer: Self-pay

## 2023-02-20 ENCOUNTER — Other Ambulatory Visit (HOSPITAL_COMMUNITY): Payer: Self-pay

## 2023-02-22 ENCOUNTER — Other Ambulatory Visit: Payer: Self-pay

## 2023-02-22 NOTE — Patient Outreach (Signed)
Care Management  Transitions of Care Program Transitions of Care Post-discharge [facility] Program Discharge   02/22/2023 Name: Dylan Robles. MRN: [id number] DOB: May 25, 1940  Subjective: Dylan Robles. is a 83 y.o. year old male who is a primary care patient of Dylan Lade, MD. The Care Management team Engaged with patient by telephone to assess and address transitions of care needs.   Consent to Services:  Case close to [facility] 30 Day Program.  Patient's January hospital admission was for an elective   TRANSURETHRAL RESECTION OF THE PROSTATE (TURP) on 02/01/2023.  The patient has progressed post-operatively without any complications.  On 02/06/23, the patient passed office voiding trials and his foley catheter could be removed.  Patient has been able to urinate without complaints of hematuria or dysuria and is voiding quantity sufficient.  Future treatment plans discussed with patient, his spouse and Urologist and it has been recommended to follow-up in 3 months to check his PSA level as his Urologist considers him to be a low risk for aggressive disease given clinical features at this time.  Patient verbalized understanding of and agreement with current plan.   Assessment:   Case is being closed to TOC at this time. Patient has RN CM contact info and aware they can contact RN CM in the future if questions, needs arise.        SDOH Interventions    Flowsheet Row Telephone from 02/05/2023 in [place] [facility] DEPARTMENT Telephone from 08/25/2022 in [facility] Community Care Coordination Clinical Support from 03/29/2021 in [facility] Office Visit from 02/02/2021 in [facility]  SDOH Interventions      Food Insecurity Interventions Intervention Not Indicated Intervention Not Indicated Intervention Not Indicated --  Housing Interventions Intervention Not Indicated Intervention Not Indicated Intervention Not Indicated --   Transportation Interventions Intervention Not Indicated, Patient Resources (Friends/Family) Intervention Not Indicated Intervention Not Indicated --  Utilities Interventions Intervention Not Indicated -- -- --  Depression Interventions/Treatment  -- -- -- PHQ2-9 Score <4 Follow-up Not Indicated  Financial Strain Interventions -- -- Intervention Not Indicated --  Physical Activity Interventions -- -- Intervention Not Indicated --  Stress Interventions -- -- Intervention Not Indicated --  Social Connections Interventions -- -- Intervention Not Indicated --        Goals Addressed             This Visit's Progress    Completed:  TOC 30 day program       Current Barriers:  Knowledge Deficits related to plan of care for management of DMII and Benign prostatic hyperplasia, S/P  TRANSURETHRAL RESECTION OF THE PROSTATE (TURP ) performed 02/01/2023  RNCM Clinical Goal(s):  Goal Met. Patient will work with the Care Management team over the next 30 days to address Transition of Care Barriers: Medication Management take all medications exactly as prescribed and will call provider for medication related questions as evidenced by EMR attend all scheduled medical appointments: PCP and Specialist as evidenced by EMR  through collaboration with RN Care manager, provider, and care team.   Interventions: Evaluation of current treatment plan related to  self management and patient's adherence to plan as established by provider. Patient has a continuous glucometer sensor and machine that the spouse helps him monitor and doses his insulin according to his readings.  Spouse reports blood sugars range 120-150's.  Transitions of Care:  Goal Met. Patient completed his post-operative follow-up visit on 02/06/23 at Urology  office.  Patient passed void trials at that appointment and foley catheter was discontinued.  Today patient is voiding without difficulty, dysuria or hematuria.  Minimal to no post-operative  pain reported. Reviewed/discussed the Urologist follow-up plans after biopsy showed malignant neoplasm of prostate and the urologist considers him to be low risk for aggressive disease and plans to check his PSA levels in 3 months.  Patient and spouse verbalized understanding and agreement with the treatment/follow-up plans and denies any questions or concerns.  Patient Goals/Self-Care Activities: Patient admission to the hospital was for an elective transurethral resection of the prostate.  Patient and spouse agree they are pleased with his recovery and can be discharged from the [facility] 30 day program.  Denies need for further intervention or outreaches. RNCM direct contact information provided should they have any health related questions or concerns after today's call. Continue to take all medications as prescribed and notify [facility] 3-7 days in advance of needing medications refilled.    Case closed to 30 Day TOC program.  Declined transfer to longitudinal RN CM.    Wyline Mood BSN, Programmer, systems   Transitions of Care  [facility], [facility] Direct Dial Number: [phone]  Fax: [phone]             Plan:  Case closed to [facility] program.  Wyline Mood BSN, RN RN Care Manager   Transitions of Care   / [facility], [facility] Direct Dial Number: [phone]  Fax: [phone]

## 2023-02-23 ENCOUNTER — Other Ambulatory Visit (HOSPITAL_BASED_OUTPATIENT_CLINIC_OR_DEPARTMENT_OTHER): Payer: Self-pay

## 2023-02-24 ENCOUNTER — Other Ambulatory Visit (HOSPITAL_COMMUNITY): Payer: Self-pay

## 2023-02-27 ENCOUNTER — Other Ambulatory Visit: Payer: Self-pay

## 2023-02-27 ENCOUNTER — Other Ambulatory Visit (HOSPITAL_COMMUNITY): Payer: Self-pay

## 2023-03-03 ENCOUNTER — Other Ambulatory Visit (HOSPITAL_COMMUNITY): Payer: Self-pay

## 2023-03-06 ENCOUNTER — Ambulatory Visit: Payer: Medicare HMO | Admitting: Internal Medicine

## 2023-03-12 ENCOUNTER — Other Ambulatory Visit (HOSPITAL_COMMUNITY): Payer: Self-pay

## 2023-03-15 ENCOUNTER — Encounter: Payer: Self-pay | Admitting: Internal Medicine

## 2023-03-15 ENCOUNTER — Other Ambulatory Visit: Payer: Self-pay

## 2023-03-15 ENCOUNTER — Other Ambulatory Visit (HOSPITAL_COMMUNITY): Payer: Self-pay

## 2023-03-15 ENCOUNTER — Ambulatory Visit (INDEPENDENT_AMBULATORY_CARE_PROVIDER_SITE_OTHER): Payer: Medicare HMO | Admitting: Internal Medicine

## 2023-03-15 VITALS — BP 117/63 | HR 58 | Ht 78.0 in | Wt 239.0 lb

## 2023-03-15 DIAGNOSIS — N401 Enlarged prostate with lower urinary tract symptoms: Secondary | ICD-10-CM

## 2023-03-15 DIAGNOSIS — N1831 Chronic kidney disease, stage 3a: Secondary | ICD-10-CM | POA: Diagnosis not present

## 2023-03-15 DIAGNOSIS — R3914 Feeling of incomplete bladder emptying: Secondary | ICD-10-CM

## 2023-03-15 DIAGNOSIS — E1159 Type 2 diabetes mellitus with other circulatory complications: Secondary | ICD-10-CM | POA: Diagnosis not present

## 2023-03-15 DIAGNOSIS — I10 Essential (primary) hypertension: Secondary | ICD-10-CM

## 2023-03-15 DIAGNOSIS — I4891 Unspecified atrial fibrillation: Secondary | ICD-10-CM

## 2023-03-15 MED ORDER — ISOSORBIDE MONONITRATE ER 60 MG PO TB24
60.0000 mg | ORAL_TABLET | Freq: Every day | ORAL | 1 refills | Status: DC
  Filled 2023-03-15: qty 90, 90d supply, fill #0
  Filled 2023-03-15 – 2023-06-08 (×2): qty 90, 90d supply, fill #1

## 2023-03-15 NOTE — Assessment & Plan Note (Signed)
 S/p TURP in January.  His postoperative course has been uncomplicated.  He remains on Flomax.  Symptoms significantly improved.

## 2023-03-15 NOTE — Assessment & Plan Note (Signed)
 AKI on CKD noted on labs from January in the setting of TURP.  Repeat BMP ordered today.  Nephrology follow-up scheduled for next month.

## 2023-03-15 NOTE — Assessment & Plan Note (Signed)
 Remains adequately controlled on current antihypertensive regimen consisting of Imdur 60 mg daily, Toprol-XL 25 mg daily, and spironolactone 25 mg daily.

## 2023-03-15 NOTE — Assessment & Plan Note (Signed)
 Followed by endocrinology.  A1c 10.2 on labs from January.  Endocrinology follow-up is scheduled for next month.

## 2023-03-15 NOTE — Assessment & Plan Note (Signed)
 Regular rate and rhythm detected on exam again today.  Remains on Eliquis and Toprol-XL.  Cardiology follow-up is scheduled for next week (3/10).

## 2023-03-15 NOTE — Patient Instructions (Signed)
 It was a pleasure to see you today.  Thank you for giving [place] the opportunity to be involved in your care.  Below is a brief recap of your visit and next steps.  We will plan to see you again in 3 months.  Summary No medication changes today Imdur refilled Check chemistry panel Follow up in 3 months

## 2023-03-15 NOTE — Progress Notes (Signed)
 Established Patient Office Visit  Subjective   Patient ID: Dylan Robles., male    DOB: 1940-06-13  Age: 83 y.o. MRN: [id number]  Chief Complaint  Patient presents with   Care Management    Three month follow up    [name] returns to care today for routine follow-up.  He was last evaluated by me in November 2024.  No medication changes were made at that time and 81-month follow-up was arranged.  In the interim, he has been seen by cardiology, podiatry, endocrinology, and urology for follow-up.  Brief hospital admission 1/23 - 1/25 for cystoscopy with TURP in the setting of BPH.  His postoperative course has been uncomplicated.  There have otherwise been no acute interval events. [name] reports feeling well today. He is asymptomatic and has no acute concerns to discuss. He is accompanied by his wife, Dylan Robles, who also has no concerns to discuss.   Past Medical History:  Diagnosis Date   Acute lower UTI 09/20/2017   Bradycardia on ECG 07/31/2011   Coronary artery disease    hyperlipidemia   Decreased GFR 09/23/2019   Dehydration 09/21/2017   Diabetes mellitus    Diabetes mellitus without complication (HCC)    Hypercholesterolemia    Hypertension    Hypothyroidism    Left arm pain 01/29/2020   Paronychia of great toe of left foot 09/26/2018   Paronychia of great toe, left 09/26/2018   PERIPHERAL NEUROPATHY 01/13/2006   Qualifier: Diagnosis of  By: Jen Mow MD, Christine     Shortness of breath dyspnea    with exertion   Sleep apnea    Stroke (HCC) 08/10/2022   Vasovagal syncope    Weakness 09/20/2017   Past Surgical History:  Procedure Laterality Date   APPENDECTOMY     CARDIAC CATHETERIZATION N/A 06/16/2014   Procedure: Left Heart Cath;  Surgeon: Laurier Nancy, MD;  Location: [facility] INVASIVE CV LAB;  Service: Cardiovascular;  Laterality: N/A;   CIRCUMCISION, NON-NEWBORN     CYSTOSCOPY N/A 02/01/2023   Procedure: CYSTOSCOPY;  Surgeon: Malen Gauze, MD;   Location: [facility] OR[facility];  Service: Urology;  Laterality: N/A;   HERNIA REPAIR     LOWER EXTREMITY ANGIOGRAPHY Left 12/24/2018   Procedure: LOWER EXTREMITY ANGIOGRAPHY;  Surgeon: Renford Dills, MD;  Location: [facility] INVASIVE CV LAB;  Service: Cardiovascular;  Laterality: Left;   THYROID SURGERY     TRANSURETHRAL RESECTION OF PROSTATE N/A 02/01/2023   Procedure: TRANSURETHRAL RESECTION OF THE PROSTATE (TURP);  Surgeon: Malen Gauze, MD;  Location: [facility] OR[facility];  Service: Urology;  Laterality: N/A;   Social History   Tobacco Use   Smoking status: Former   Smokeless tobacco: Never  Advertising account planner   Vaping status: Never Used  Substance Use Topics   Alcohol use: No   Drug use: No   Family History  Problem Relation Age of Onset   Diabetes Father    Hypertension Father    Cancer Mother    Diabetes Brother    Allergies  Allergen Reactions   Plavix [Clopidogrel] Hives   Review of Systems  Constitutional:  Negative for chills and fever.  HENT:  Negative for sore throat.   Respiratory:  Negative for cough and shortness of breath.   Cardiovascular:  Negative for chest pain, palpitations and leg swelling.  Gastrointestinal:  Negative for abdominal pain, blood in stool, constipation, diarrhea, nausea and vomiting.  Genitourinary:  Negative for dysuria and hematuria.  Musculoskeletal:  Negative for myalgias.  Skin:  Negative for itching and rash.  Neurological:  Negative for dizziness and headaches.  Psychiatric/Behavioral:  Negative for depression and suicidal ideas.      Objective:     BP 117/63 (BP Location: Left Arm, Patient Position: Sitting, Cuff Size: Large)   Pulse (!) 58   Ht 6\' 6"  (1.981 m)   Wt 239 lb (108.4 kg)   SpO2 97%   BMI 27.62 kg/m  BP Readings from Last 3 Encounters:  03/15/23 117/63  02/06/23 (!) 146/89  02/03/23 127/65   Physical Exam Vitals reviewed.  Constitutional:      General: He is not in acute distress.    Appearance: Normal appearance. He is not  ill-appearing.  HENT:     Head: Normocephalic and atraumatic.     Right Ear: External ear normal.     Left Ear: External ear normal.     Nose: Nose normal. No congestion or rhinorrhea.     Mouth/Throat:     Mouth: Mucous membranes are moist.     Pharynx: Oropharynx is clear.  Eyes:     General: No scleral icterus.    Extraocular Movements: Extraocular movements intact.     Conjunctiva/sclera: Conjunctivae normal.     Pupils: Pupils are equal, round, and reactive to light.  Cardiovascular:     Rate and Rhythm: Normal rate and regular rhythm.     Pulses: Normal pulses.     Heart sounds: Normal heart sounds. No murmur heard. Pulmonary:     Effort: Pulmonary effort is normal.     Breath sounds: Normal breath sounds. No wheezing, rhonchi or rales.  Abdominal:     General: Abdomen is flat. Bowel sounds are normal. There is no distension.     Palpations: Abdomen is soft.     Tenderness: There is no abdominal tenderness.  Musculoskeletal:        General: No swelling or deformity. Normal range of motion.     Cervical back: Normal range of motion.  Skin:    General: Skin is warm and dry.     Capillary Refill: Capillary refill takes less than 2 seconds.  Neurological:     General: No focal deficit present.     Mental Status: He is alert and oriented to person, place, and time.     Motor: No weakness.     Gait: Gait abnormal (ambulates with a walker).  Psychiatric:        Mood and Affect: Mood normal.        Behavior: Behavior normal.        Thought Content: Thought content normal.   Last CBC Lab Results  Component Value Date   WBC 16.6 (H) 02/02/2023   HGB 10.3 (L) 02/02/2023   HCT 32.2 (L) 02/02/2023   MCV 92.3 02/02/2023   MCH 29.5 02/02/2023   RDW 15.2 02/02/2023   PLT 152 02/02/2023   Last metabolic panel Lab Results  Component Value Date   GLUCOSE 219 (H) 02/02/2023   NA 130 (L) 02/02/2023   K 4.6 02/02/2023   CL 103 02/02/2023   CO2 19 (L) 02/02/2023   BUN 38  (H) 02/02/2023   CREATININE 2.29 (H) 02/03/2023   GFRNONAA 27 (L) 02/03/2023   CALCIUM 8.6 (L) 02/02/2023   PROT 6.4 (L) 08/20/2022   ALBUMIN 3.3 (L) 08/20/2022   LABGLOB 2.1 05/10/2022   AGRATIO 1.9 05/10/2022   BILITOT 0.8 08/20/2022   ALKPHOS 95 08/20/2022   AST 24 08/20/2022   ALT 28  08/20/2022   ANIONGAP 8 02/02/2023   Last lipids Lab Results  Component Value Date   CHOL 126 08/21/2022   HDL 34 (L) 08/21/2022   LDLCALC 77 08/21/2022   TRIG 74 08/21/2022   CHOLHDL 3.7 08/21/2022   Last hemoglobin A1c Lab Results  Component Value Date   HGBA1C 10.2 (A) 01/26/2023   Last thyroid functions Lab Results  Component Value Date   TSH 1.510 08/20/2022   Last vitamin D Lab Results  Component Value Date   VD25OH 28.8 (L) 05/10/2022   Last vitamin B12 and Folate Lab Results  Component Value Date   VITAMINB12 490 11/10/2021   FOLATE 14.3 11/10/2021     Assessment & Plan:   Problem List Items Addressed This Visit       Essential hypertension   Remains adequately controlled on current antihypertensive regimen consisting of Imdur 60 mg daily, Toprol-XL 25 mg daily, and spironolactone 25 mg daily.      Type 2 diabetes mellitus with vascular disease (HCC)   Followed by endocrinology.  A1c 10.2 on labs from January.  Endocrinology follow-up is scheduled for next month.      New onset atrial fibrillation (HCC)   Regular rate and rhythm detected on exam again today.  Remains on Eliquis and Toprol-XL.  Cardiology follow-up is scheduled for next week (3/10).      Stage 3a chronic kidney disease (HCC) - Primary   AKI on CKD noted on labs from January in the setting of TURP.  Repeat BMP ordered today.  Nephrology follow-up scheduled for next month.      BPH (benign prostatic hyperplasia)   S/p TURP in January.  His postoperative course has been uncomplicated.  He remains on Flomax.  Symptoms significantly improved.      Return in about 3 months (around 06/15/2023).    Billie Lade, MD

## 2023-03-16 ENCOUNTER — Other Ambulatory Visit: Payer: Self-pay

## 2023-03-16 ENCOUNTER — Encounter: Payer: Self-pay | Admitting: Internal Medicine

## 2023-03-16 LAB — BASIC METABOLIC PANEL
BUN/Creatinine Ratio: 11 (ref 10–24)
BUN: 27 mg/dL (ref 8–27)
CO2: 24 mmol/L (ref 20–29)
Calcium: 10.2 mg/dL (ref 8.6–10.2)
Chloride: 100 mmol/L (ref 96–106)
Creatinine, Ser: 2.35 mg/dL — ABNORMAL HIGH (ref 0.76–1.27)
Glucose: 183 mg/dL — ABNORMAL HIGH (ref 70–99)
Potassium: 5.1 mmol/L (ref 3.5–5.2)
Sodium: 140 mmol/L (ref 134–144)
eGFR: 27 mL/min/{1.73_m2} — ABNORMAL LOW (ref >59)

## 2023-03-19 ENCOUNTER — Ambulatory Visit: Payer: Medicare HMO | Admitting: Cardiovascular Disease

## 2023-03-19 ENCOUNTER — Encounter: Payer: Self-pay | Admitting: Cardiovascular Disease

## 2023-03-19 VITALS — BP 108/58 | HR 88 | Ht 78.0 in | Wt 233.0 lb

## 2023-03-19 DIAGNOSIS — I25118 Atherosclerotic heart disease of native coronary artery with other forms of angina pectoris: Secondary | ICD-10-CM | POA: Diagnosis not present

## 2023-03-19 DIAGNOSIS — E782 Mixed hyperlipidemia: Secondary | ICD-10-CM

## 2023-03-19 DIAGNOSIS — Z8679 Personal history of other diseases of the circulatory system: Secondary | ICD-10-CM

## 2023-03-19 DIAGNOSIS — I70213 Atherosclerosis of native arteries of extremities with intermittent claudication, bilateral legs: Secondary | ICD-10-CM | POA: Diagnosis not present

## 2023-03-19 DIAGNOSIS — I5032 Chronic diastolic (congestive) heart failure: Secondary | ICD-10-CM

## 2023-03-19 DIAGNOSIS — Z0130 Encounter for examination of blood pressure without abnormal findings: Secondary | ICD-10-CM

## 2023-03-19 DIAGNOSIS — E1159 Type 2 diabetes mellitus with other circulatory complications: Secondary | ICD-10-CM

## 2023-03-19 NOTE — Progress Notes (Signed)
 Cardiology Office Note   Date:  03/19/2023   ID:  Dylan Primus Dillian Feig., DOB 07-03-1940, MRN [id number]  PCP:  Billie Lade, MD  Cardiologist:  Adrian Blackwater, MD      History of Present Illness: Dylan Robles. is a 83 y.o. male who presents for  Chief Complaint  Patient presents with   Follow-up    3 Months Follow Up    Doing well      Past Medical History:  Diagnosis Date   Acute lower UTI 09/20/2017   Bradycardia on ECG 07/31/2011   Coronary artery disease    hyperlipidemia   Decreased GFR 09/23/2019   Dehydration 09/21/2017   Diabetes mellitus    Diabetes mellitus without complication (HCC)    Hypercholesterolemia    Hypertension    Hypothyroidism    Left arm pain 01/29/2020   Paronychia of great toe of left foot 09/26/2018   Paronychia of great toe, left 09/26/2018   PERIPHERAL NEUROPATHY 01/13/2006   Qualifier: Diagnosis of  By: Jen Mow MD, Christine     Shortness of breath dyspnea    with exertion   Sleep apnea    Stroke (HCC) 08/10/2022   Vasovagal syncope    Weakness 09/20/2017     Past Surgical History:  Procedure Laterality Date   APPENDECTOMY     CARDIAC CATHETERIZATION N/A 06/16/2014   Procedure: Left Heart Cath;  Surgeon: Laurier Nancy, MD;  Location: [facility] INVASIVE CV LAB;  Service: Cardiovascular;  Laterality: N/A;   CIRCUMCISION, NON-NEWBORN     CYSTOSCOPY N/A 02/01/2023   Procedure: CYSTOSCOPY;  Surgeon: Malen Gauze, MD;  Location: [facility];  Service: Urology;  Laterality: N/A;   HERNIA REPAIR     LOWER EXTREMITY ANGIOGRAPHY Left 12/24/2018   Procedure: LOWER EXTREMITY ANGIOGRAPHY;  Surgeon: Renford Dills, MD;  Location: [facility] INVASIVE CV LAB;  Service: Cardiovascular;  Laterality: Left;   THYROID SURGERY     TRANSURETHRAL RESECTION OF PROSTATE N/A 02/01/2023   Procedure: TRANSURETHRAL RESECTION OF THE PROSTATE (TURP);  Surgeon: Malen Gauze, MD;  Location: [facility];  Service: Urology;  Laterality: N/A;      Current Outpatient Medications  Medication Sig Dispense Refill   apixaban (ELIQUIS) 2.5 MG TABS tablet Take 1 tablet (2.5 mg total) by mouth 2 (two) times daily. 60 tablet 3   atorvastatin (LIPITOR) 80 MG tablet Take 1 tablet (80 mg total) by mouth daily. 90 tablet 3   cetirizine (ZYRTEC) 5 MG tablet Take 1 tablet (5 mg total) by mouth daily. 90 tablet 3   Continuous Blood Gluc Sensor (FREESTYLE LIBRE 2 SENSOR) MISC Use to check glucose at least 4 times daily 6 each 3   Continuous Glucose Sensor (FREESTYLE LIBRE 3 PLUS SENSOR) MISC Change sensor every 15 days. 6 each 3   ezetimibe (ZETIA) 10 MG tablet Take 1 tablet (10 mg total) by mouth daily. 90 tablet 3   Fish Oil-Vitamin D 1000-1000 MG-UNIT CAPS Take 1 capsule by mouth daily.     furosemide (LASIX) 20 MG tablet Take 20 mg by mouth daily as needed for edema or fluid.     gabapentin (NEURONTIN) 300 MG capsule Take 1 capsule (300 mg total) by mouth 3 (three) times daily. 270 capsule 1   insulin aspart (NOVOLOG) 100 UNIT/ML FlexPen Inject 10-16 Units into the skin 3 (three) times daily with meals. 30 mL 3   insulin degludec (TRESIBA FLEXTOUCH) 100 UNIT/ML FlexTouch Pen Inject 20 Units into the  skin at bedtime. 18 mL 3   isosorbide mononitrate (IMDUR) 60 MG 24 hr tablet Take 1 tablet (60 mg total) by mouth daily. 90 tablet 1   levothyroxine (SYNTHROID) 88 MCG tablet Take 1 tablet (88 mcg total) by mouth daily before breakfast. 90 tablet 3   metoprolol succinate (TOPROL-XL) 25 MG 24 hr tablet Take 1 tablet (25 mg total) by mouth daily. 30 tablet 11   spironolactone (ALDACTONE) 25 MG tablet Take 1 tablet (25 mg total) by mouth daily. 30 tablet 11   tamsulosin (FLOMAX) 0.4 MG CAPS capsule Take 1 capsule (0.4 mg total) by mouth in the morning and at bedtime. 180 capsule 3   triamcinolone cream (KENALOG) 0.1 % apply cream externally to affected area twice dailiy. 15 g 1   No current facility-administered medications for this visit.     Allergies:   Plavix [clopidogrel]    Social History:   reports that he has quit smoking. He has never used smokeless tobacco. He reports that he does not drink alcohol and does not use drugs.   Family History:  family history includes Cancer in his mother; Diabetes in his brother and father; Hypertension in his father.    ROS:     Review of Systems  Constitutional: Negative.   HENT: Negative.    Eyes: Negative.   Respiratory: Negative.    Gastrointestinal: Negative.   Genitourinary: Negative.   Musculoskeletal: Negative.   Skin: Negative.   Neurological: Negative.   Endo/Heme/Allergies: Negative.   Psychiatric/Behavioral: Negative.    All other systems reviewed and are negative.     All other systems are reviewed and negative.    PHYSICAL EXAM: VS:  BP (!) 108/58   Pulse 88   Ht 6\' 6"  (1.981 m)   Wt 233 lb (105.7 kg)   SpO2 96%   BMI 26.93 kg/m  , BMI Body mass index is 26.93 kg/m. Last weight:  Wt Readings from Last 3 Encounters:  03/19/23 233 lb (105.7 kg)  03/15/23 239 lb (108.4 kg)  02/01/23 243 lb 6.2 oz (110.4 kg)     Physical Exam Vitals reviewed.  Constitutional:      Appearance: Normal appearance. He is normal weight.  HENT:     Head: Normocephalic.     Nose: Nose normal.     Mouth/Throat:     Mouth: Mucous membranes are moist.  Eyes:     Pupils: Pupils are equal, round, and reactive to light.  Cardiovascular:     Rate and Rhythm: Normal rate and regular rhythm.     Pulses: Normal pulses.     Heart sounds: Normal heart sounds.  Pulmonary:     Effort: Pulmonary effort is normal.  Abdominal:     General: Abdomen is flat. Bowel sounds are normal.  Musculoskeletal:        General: Normal range of motion.     Cervical back: Normal range of motion.  Skin:    General: Skin is warm.  Neurological:     General: No focal deficit present.     Mental Status: He is alert.  Psychiatric:        Mood and Affect: Mood normal.       EKG:    Recent Labs: 08/20/2022: ALT 28; B Natriuretic Peptide 439.8; TSH 1.510 02/02/2023: Hemoglobin 10.3; Platelets 152 03/15/2023: BUN 27; Creatinine, Ser 2.35; Potassium 5.1; Sodium 140    Lipid Panel    Component Value Date/Time   CHOL 126 08/21/2022 0502  CHOL 125 05/10/2022 0917   TRIG 74 08/21/2022 0502   HDL 34 (L) 08/21/2022 0502   HDL 46 05/10/2022 0917   CHOLHDL 3.7 08/21/2022 0502   VLDL 15 08/21/2022 0502   LDLCALC 77 08/21/2022 0502   LDLCALC 65 05/10/2022 0917   LDLCALC 73 11/03/2020 1032      Other studies Reviewed: Additional studies/ records that were reviewed today include:  Review of the above records demonstrates:       No data to display            ASSESSMENT AND PLAN:    ICD-10-CM   1. Coronary artery disease of native artery of native heart with stable angina pectoris (HCC)  I25.118     2. Chronic diastolic CHF (congestive heart failure) (HCC)  I50.32    compensated    3. Atrial fibrillation, currently in sinus rhythm  Z86.79    in nsr    4. Mixed hyperlipidemia  E78.2     5. Atherosclerosis of native artery of both lower extremities with intermittent claudication (HCC)  I70.213        Problem List Items Addressed This Visit       Cardiovascular and Mediastinum   Coronary artery disease of native artery of native heart with stable angina pectoris (HCC) - Primary   Atherosclerosis of native arteries of extremity with intermittent claudication (HCC)   Chronic diastolic CHF (congestive heart failure) (HCC)     Other   Hyperlipidemia   Other Visit Diagnoses       Atrial fibrillation, currently in sinus rhythm       in nsr          Disposition:   Return in about 3 months (around 06/19/2023).    Total time spent: 30 minutes  Signed,  Adrian Blackwater, MD  03/19/2023 9:42 AM    [facility]

## 2023-03-21 DIAGNOSIS — D631 Anemia in chronic kidney disease: Secondary | ICD-10-CM | POA: Diagnosis not present

## 2023-03-21 DIAGNOSIS — R809 Proteinuria, unspecified: Secondary | ICD-10-CM | POA: Diagnosis not present

## 2023-03-21 DIAGNOSIS — E211 Secondary hyperparathyroidism, not elsewhere classified: Secondary | ICD-10-CM | POA: Diagnosis not present

## 2023-03-21 DIAGNOSIS — N189 Chronic kidney disease, unspecified: Secondary | ICD-10-CM | POA: Diagnosis not present

## 2023-03-23 DIAGNOSIS — D631 Anemia in chronic kidney disease: Secondary | ICD-10-CM | POA: Diagnosis not present

## 2023-03-23 DIAGNOSIS — E211 Secondary hyperparathyroidism, not elsewhere classified: Secondary | ICD-10-CM | POA: Diagnosis not present

## 2023-03-23 DIAGNOSIS — N189 Chronic kidney disease, unspecified: Secondary | ICD-10-CM | POA: Diagnosis not present

## 2023-03-23 DIAGNOSIS — R809 Proteinuria, unspecified: Secondary | ICD-10-CM | POA: Diagnosis not present

## 2023-03-24 ENCOUNTER — Other Ambulatory Visit (HOSPITAL_COMMUNITY): Payer: Self-pay

## 2023-03-27 ENCOUNTER — Ambulatory Visit (INDEPENDENT_AMBULATORY_CARE_PROVIDER_SITE_OTHER): Payer: Medicare HMO

## 2023-03-27 ENCOUNTER — Ambulatory Visit (INDEPENDENT_AMBULATORY_CARE_PROVIDER_SITE_OTHER): Payer: Medicare HMO | Admitting: Nurse Practitioner

## 2023-03-27 ENCOUNTER — Encounter (INDEPENDENT_AMBULATORY_CARE_PROVIDER_SITE_OTHER): Payer: Self-pay | Admitting: Nurse Practitioner

## 2023-03-27 VITALS — BP 135/71 | HR 81 | Resp 18 | Wt 235.6 lb

## 2023-03-27 DIAGNOSIS — N184 Chronic kidney disease, stage 4 (severe): Secondary | ICD-10-CM | POA: Diagnosis not present

## 2023-03-27 DIAGNOSIS — E1159 Type 2 diabetes mellitus with other circulatory complications: Secondary | ICD-10-CM | POA: Diagnosis not present

## 2023-03-27 DIAGNOSIS — I70213 Atherosclerosis of native arteries of extremities with intermittent claudication, bilateral legs: Secondary | ICD-10-CM

## 2023-03-27 DIAGNOSIS — E1129 Type 2 diabetes mellitus with other diabetic kidney complication: Secondary | ICD-10-CM | POA: Diagnosis not present

## 2023-03-27 DIAGNOSIS — N2581 Secondary hyperparathyroidism of renal origin: Secondary | ICD-10-CM | POA: Diagnosis not present

## 2023-03-27 DIAGNOSIS — I10 Essential (primary) hypertension: Secondary | ICD-10-CM | POA: Diagnosis not present

## 2023-03-27 DIAGNOSIS — I5032 Chronic diastolic (congestive) heart failure: Secondary | ICD-10-CM | POA: Diagnosis not present

## 2023-03-27 NOTE — Progress Notes (Incomplete)
 Subjective:    Patient ID: Dylan Snell., male    DOB: [birth year], 83 y.o.   MRN: [id number] Chief Complaint  Patient presents with  . Follow-up    26yr abi follow up    HPI  Review of Systems     Objective:   Physical Exam  BP 135/71   Pulse 81   Resp 18   Wt 235 lb 9.6 oz (106.9 kg)   BMI 27.23 kg/m   Past Medical History:  Diagnosis Date  . Acute lower UTI 09/20/2017  . Bradycardia on ECG 07/31/2011  . Coronary artery disease    hyperlipidemia  . Decreased GFR 09/23/2019  . Dehydration 09/21/2017  . Diabetes mellitus   . Diabetes mellitus without complication (HCC)   . Hypercholesterolemia   . Hypertension   . Hypothyroidism   . Left arm pain 01/29/2020  . Paronychia of great toe of left foot 09/26/2018  . Paronychia of great toe, left 09/26/2018  . PERIPHERAL NEUROPATHY 01/13/2006   Qualifier: Diagnosis of  By: Jen Mow MD, Christine    . Shortness of breath dyspnea    with exertion  . Sleep apnea   . Stroke (HCC) 08/10/2022  . Vasovagal syncope   . Weakness 09/20/2017    Social History   Socioeconomic History  . Marital status: Married    Spouse name: Not on file  . Number of children: Not on file  . Years of education: Not on file  . Highest education level: Not on file  Occupational History  . 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 Smoking status: Former  . Smokeless tobacco: Never  Vaping Use  . Vaping status: Never Used  Substance and Sexual Activity  . Alcohol use: No  . Drug use: No  . Sexual activity: Not on file  Other Topics Concern  . Not on file  Social History Narrative   ** Merged History Encounter **       Social Drivers of Health   Financial Resource Strain: Low Risk  (03/29/2021)   Overall Financial Resource Strain (CARDIA)   . Difficulty of Paying Living Expenses: Not very hard  Food Insecurity: No Food Insecurity (02/05/2023)   Hunger Vital Sign   . Worried About Programme researcher, broadcasting/film/video in  the Last Year: Never true   . Ran Out of Food in the Last Year: Never true  Transportation Needs: No Transportation Needs (02/05/2023)   PRAPARE - Transportation   . Lack of Transportation (Medical): No   . Lack of Transportation (Non-Medical): No  Physical Activity: Insufficiently Active (03/29/2021)   Exercise Vital Sign   . Days of Exercise per Week: 7 days   . Minutes of Exercise per Session: 20 min  Stress: No Stress Concern Present (03/29/2021)   [employer] of Occupational Health - Occupational Stress Questionnaire   . Feeling of Stress : Not at all  Social Connections: Socially Integrated (02/01/2023)   Social Connection and Isolation Panel [NHANES]   . Frequency of Communication with Friends and Family: More than three times a week   . Frequency of Social Gatherings with Friends and Family: More than three times a week   . Attends Religious Services: More than 4 times per year   . Active Member of Clubs or Organizations: Yes   . Attends Banker Meetings: 1 to 4 times per year   . Marital Status: Married  Catering manager Violence: Not At Risk (02/05/2023)  Humiliation, Afraid, Rape, and Kick questionnaire   . Fear of Current or Ex-Partner: No   . Emotionally Abused: No   . Physically Abused: No   . Sexually Abused: No    Past Surgical History:  Procedure Laterality Date  . APPENDECTOMY    . CARDIAC CATHETERIZATION N/A 06/16/2014   Procedure: Left Heart Cath;  Surgeon: Laurier Nancy, MD;  Location: [facility] INVASIVE CV LAB;  Service: Cardiovascular;  Laterality: N/A;  . CIRCUMCISION, NON-NEWBORN    . CYSTOSCOPY N/A 02/01/2023   Procedure: CYSTOSCOPY;  Surgeon: Malen Gauze, MD;  Location: [facility];  Service: Urology;  Laterality: N/A;  . HERNIA REPAIR    . LOWER EXTREMITY ANGIOGRAPHY Left 12/24/2018   Procedure: LOWER EXTREMITY ANGIOGRAPHY;  Surgeon: Renford Dills, MD;  Location: [facility] INVASIVE CV LAB;  Service: Cardiovascular;  Laterality: Left;  .  THYROID SURGERY    . TRANSURETHRAL RESECTION OF PROSTATE N/A 02/01/2023   Procedure: TRANSURETHRAL RESECTION OF THE PROSTATE (TURP);  Surgeon: Malen Gauze, MD;  Location: [facility];  Service: Urology;  Laterality: N/A;    Family History  Problem Relation Age of Onset  . Diabetes Father   . Hypertension Father   . Cancer Mother   . Diabetes Brother     Allergies  Allergen Reactions  . Plavix [Clopidogrel] Hives       Latest Ref Rng & Units 02/02/2023    4:18 AM 02/01/2023   11:09 AM 01/30/2023    1:24 PM  CBC  WBC 4.0 - 10.5 K/uL 16.6  7.1  6.9   Hemoglobin 13.0 - 17.0 g/dL 16.1  09.6  04.5   Hematocrit 39.0 - 52.0 % 32.2  34.6  36.8   Platelets 150 - 400 K/uL 152  177  207       CMP     Component Value Date/Time   NA 140 03/15/2023 1209   K 5.1 03/15/2023 1209   CL 100 03/15/2023 1209   CO2 24 03/15/2023 1209   GLUCOSE 183 (H) 03/15/2023 1209   GLUCOSE 219 (H) 02/02/2023 0418   BUN 27 03/15/2023 1209   CREATININE 2.35 (H) 03/15/2023 1209   CREATININE 1.34 (H) 02/02/2021 0839   CALCIUM 10.2 03/15/2023 1209   PROT 6.4 (L) 08/20/2022 1955   PROT 6.0 05/10/2022 0917   ALBUMIN 3.3 (L) 08/20/2022 1955   ALBUMIN 3.9 05/10/2022 0917   AST 24 08/20/2022 1955   ALT 28 08/20/2022 1955   ALKPHOS 95 08/20/2022 1955   BILITOT 0.8 08/20/2022 1955   BILITOT 1.1 05/10/2022 0917   EGFR 27 (L) 03/15/2023 1209   GFRNONAA 27 (L) 02/03/2023 0533   GFRNONAA 54 (L) 04/29/2020 1141     No results found.     Assessment & Plan:   1. Atherosclerosis of native artery of both lower extremities with intermittent claudication (HCC) (Primary)  Recommend:  The patient has evidence of atherosclerosis of the lower extremities with claudication.  The patient does not voice lifestyle limiting changes at this point in time.  Noninvasive studies do not suggest clinically significant change.  No invasive studies, angiography or surgery at this time The patient should continue walking  and begin a more formal exercise program.  The patient should continue antiplatelet therapy and aggressive treatment of the lipid abnormalities  No changes in the patient's medications at this time  Continued surveillance is indicated as atherosclerosis is likely to progress with time.    The patient will continue follow up with noninvasive  studies as ordered.  - VAS [place] ABI WITH/WO TBI; Future - VAS [place] LOWER EXTREMITY ARTERIAL DUPLEX; Future  2. Essential hypertension Continue antihypertensive medications as already ordered, these medications have been reviewed and there are no changes at this time.  3. Type 2 diabetes mellitus with vascular disease (HCC) Continue hypoglycemic medications as already ordered, these medications have been reviewed and there are no changes at this time.  Hgb A1C to be monitored as already arranged by primary service   Current Outpatient Medications on File Prior to Visit  Medication Sig Dispense Refill  . apixaban (ELIQUIS) 2.5 MG TABS tablet Take 1 tablet (2.5 mg total) by mouth 2 (two) times daily. 60 tablet 3  . atorvastatin (LIPITOR) 80 MG tablet Take 1 tablet (80 mg total) by mouth daily. 90 tablet 3  . cetirizine (ZYRTEC) 5 MG tablet Take 1 tablet (5 mg total) by mouth daily. 90 tablet 3  . Continuous Blood Gluc Sensor (FREESTYLE LIBRE 2 SENSOR) MISC Use to check glucose at least 4 times daily 6 each 3  . Continuous Glucose Sensor (FREESTYLE LIBRE 3 PLUS SENSOR) MISC Change sensor every 15 days. 6 each 3  . ezetimibe (ZETIA) 10 MG tablet Take 1 tablet (10 mg total) by mouth daily. 90 tablet 3  . Fish Oil-Vitamin D 1000-1000 MG-UNIT CAPS Take 1 capsule by mouth daily.    . furosemide (LASIX) 20 MG tablet Take 20 mg by mouth daily as needed for edema or fluid.    [name] gabapentin (NEURONTIN) 300 MG capsule Take 1 capsule (300 mg total) by mouth 3 (three) times daily. 270 capsule 1  . insulin aspart (NOVOLOG) 100 UNIT/ML FlexPen Inject 10-16 Units into the  skin 3 (three) times daily with meals. 30 mL 3  . insulin degludec (TRESIBA FLEXTOUCH) 100 UNIT/ML FlexTouch Pen Inject 20 Units into the skin at bedtime. 18 mL 3  . isosorbide mononitrate (IMDUR) 60 MG 24 hr tablet Take 1 tablet (60 mg total) by mouth daily. 90 tablet 1  . levothyroxine (SYNTHROID) 88 MCG tablet Take 1 tablet (88 mcg total) by mouth daily before breakfast. 90 tablet 3  . metoprolol succinate (TOPROL-XL) 25 MG 24 hr tablet Take 1 tablet (25 mg total) by mouth daily. 30 tablet 11  . spironolactone (ALDACTONE) 25 MG tablet Take 1 tablet (25 mg total) by mouth daily. 30 tablet 11  . tamsulosin (FLOMAX) 0.4 MG CAPS capsule Take 1 capsule (0.4 mg total) by mouth in the morning and at bedtime. 180 capsule 3  . triamcinolone cream (KENALOG) 0.1 % apply cream externally to affected area twice dailiy. 15 g 1   No current facility-administered medications on file prior to visit.    There are no Patient Instructions on file for this visit. No follow-ups on file.   Dylan Spinner, NP

## 2023-03-27 NOTE — Progress Notes (Signed)
 Subjective:    Patient ID: Dylan Snell., male    DOB: May 11, 1940, 83 y.o.   MRN: [id number] Chief Complaint  Patient presents with   Follow-up    37yr abi follow up    The patient returns to the office for followup regarding atherosclerotic changes of the lower extremities and review of the noninvasive studies.   There have been no interval changes in lower extremity symptoms. No interval shortening of the patient's claudication distance or development of rest pain symptoms. No new ulcers or wounds have occurred since the last visit.  There have been no significant changes to the patient's overall health care.  He does endorse having some pain in his left great toe sometimes but this is sporadic and does not always come in the evening.  The patient denies amaurosis fugax or recent TIA symptoms. There are no documented recent neurological changes noted. There is no history of DVT, PE or superficial thrombophlebitis. The patient denies recent episodes of angina or shortness of breath.   ABI Rt=0.82 and Lt=0.90  (previous ABI's Rt=0.88 and Lt=0.99) Duplex ultrasound of the right tibial vessels show monophasic waveforms with biphasic/monophasic in the right.  Slightly dampened toe waveforms in the right foot are normal in the left    Review of Systems  Neurological:  Positive for numbness.  All other systems reviewed and are negative.      Objective:   Physical Exam Vitals reviewed.  HENT:     Head: Normocephalic.  Cardiovascular:     Rate and Rhythm: Normal rate.  Pulmonary:     Effort: Pulmonary effort is normal.  Skin:    General: Skin is warm and dry.  Neurological:     Mental Status: He is alert and oriented to person, place, and time.  Psychiatric:        Mood and Affect: Mood normal.        Behavior: Behavior normal.        Thought Content: Thought content normal.        Judgment: Judgment normal.     BP 135/71   Pulse 81   Resp 18   Wt 235 lb 9.6  oz (106.9 kg)   BMI 27.23 kg/m   Past Medical History:  Diagnosis Date   Acute lower UTI 09/20/2017   Bradycardia on ECG 07/31/2011   Coronary artery disease    hyperlipidemia   Decreased GFR 09/23/2019   Dehydration 09/21/2017   Diabetes mellitus    Diabetes mellitus without complication (HCC)    Hypercholesterolemia    Hypertension    Hypothyroidism    Left arm pain 01/29/2020   Paronychia of great toe of left foot 09/26/2018   Paronychia of great toe, left 09/26/2018   PERIPHERAL NEUROPATHY 01/13/2006   Qualifier: Diagnosis of  By: Jen Mow MD, Christine     Shortness of breath dyspnea    with exertion   Sleep apnea    Stroke (HCC) 08/10/2022   Vasovagal syncope    Weakness 09/20/2017    Social History   Socioeconomic History   Marital status: Married    Spouse name: Not on file   Number of children: Not on file   Years of education: Not on file   Highest education level: Not on file  Occupational History   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  Tobacco Use   Smoking status: Former   Smokeless tobacco: Never  Vaping Use   Vaping status: Never Used  Substance and Sexual Activity   Alcohol use: No   Drug use: No   Sexual activity: Not on file  Other Topics Concern   Not on file  Social History Narrative   ** Merged History Encounter **       Social Drivers of Health   Financial Resource Strain: Low Risk  (03/29/2021)   Overall Financial Resource Strain (CARDIA)    Difficulty of Paying Living Expenses: Not very hard  Food Insecurity: No Food Insecurity (02/05/2023)   Hunger Vital Sign    Worried About Running Out of Food in the Last Year: Never true    Ran Out of Food in the Last Year: Never true  Transportation Needs: No Transportation Needs (02/05/2023)   PRAPARE - Administrator, Civil Service (Medical): No    Lack of Transportation (Non-Medical): No  Physical Activity: Insufficiently Active (03/29/2021)   Exercise Vital Sign    Days  of Exercise per Week: 7 days    Minutes of Exercise per Session: 20 min  Stress: No Stress Concern Present (03/29/2021)   [employer] of Occupational Health - Occupational Stress Questionnaire    Feeling of Stress : Not at all  Social Connections: Socially Integrated (02/01/2023)   Social Connection and Isolation Panel [NHANES]    Frequency of Communication with Friends and Family: More than three times a week    Frequency of Social Gatherings with Friends and Family: More than three times a week    Attends Religious Services: More than 4 times per year    Active Member of Clubs or Organizations: Yes    Attends Banker Meetings: 1 to 4 times per year    Marital Status: Married  Catering manager Violence: Not At Risk (02/05/2023)   Humiliation, Afraid, Rape, and Kick questionnaire    Fear of Current or Ex-Partner: No    Emotionally Abused: No    Physically Abused: No    Sexually Abused: No    Past Surgical History:  Procedure Laterality Date   APPENDECTOMY     CARDIAC CATHETERIZATION N/A 06/16/2014   Procedure: Left Heart Cath;  Surgeon: Laurier Nancy, MD;  Location: [facility] INVASIVE CV LAB;  Service: Cardiovascular;  Laterality: N/A;   CIRCUMCISION, NON-NEWBORN     CYSTOSCOPY N/A 02/01/2023   Procedure: CYSTOSCOPY;  Surgeon: Malen Gauze, MD;  Location: [facility];  Service: Urology;  Laterality: N/A;   HERNIA REPAIR     LOWER EXTREMITY ANGIOGRAPHY Left 12/24/2018   Procedure: LOWER EXTREMITY ANGIOGRAPHY;  Surgeon: Renford Dills, MD;  Location: [facility] INVASIVE CV LAB;  Service: Cardiovascular;  Laterality: Left;   THYROID SURGERY     TRANSURETHRAL RESECTION OF PROSTATE N/A 02/01/2023   Procedure: TRANSURETHRAL RESECTION OF THE PROSTATE (TURP);  Surgeon: Malen Gauze, MD;  Location: [facility];  Service: Urology;  Laterality: N/A;    Family History  Problem Relation Age of Onset   Diabetes Father    Hypertension Father    Cancer Mother    Diabetes Brother      Allergies  Allergen Reactions   Plavix [Clopidogrel] Hives       Latest Ref Rng & Units 02/02/2023    4:18 AM 02/01/2023   11:09 AM 01/30/2023    1:24 PM  CBC  WBC 4.0 - 10.5 K/uL 16.6  7.1  6.9   Hemoglobin 13.0 - 17.0 g/dL 86.5  78.4  69.6   Hematocrit 39.0 - 52.0 % 32.2  34.6  36.8  Platelets 150 - 400 K/uL 152  177  207       CMP     Component Value Date/Time   NA 140 03/15/2023 1209   K 5.1 03/15/2023 1209   CL 100 03/15/2023 1209   CO2 24 03/15/2023 1209   GLUCOSE 183 (H) 03/15/2023 1209   GLUCOSE 219 (H) 02/02/2023 0418   BUN 27 03/15/2023 1209   CREATININE 2.35 (H) 03/15/2023 1209   CREATININE 1.34 (H) 02/02/2021 0839   CALCIUM 10.2 03/15/2023 1209   PROT 6.4 (L) 08/20/2022 1955   PROT 6.0 05/10/2022 0917   ALBUMIN 3.3 (L) 08/20/2022 1955   ALBUMIN 3.9 05/10/2022 0917   AST 24 08/20/2022 1955   ALT 28 08/20/2022 1955   ALKPHOS 95 08/20/2022 1955   BILITOT 0.8 08/20/2022 1955   BILITOT 1.1 05/10/2022 0917   EGFR 27 (L) 03/15/2023 1209   GFRNONAA 27 (L) 02/03/2023 0533   GFRNONAA 54 (L) 04/29/2020 1141     No results found.     Assessment & Plan:   1. Atherosclerosis of native artery of both lower extremities with intermittent claudication (HCC) (Primary)  Recommend:  The patient has evidence of atherosclerosis of the lower extremities with claudication.  The patient does not voice lifestyle limiting changes at this point in time.  Noninvasive studies do not suggest clinically significant change.  No invasive studies, angiography or surgery at this time The patient should continue walking and begin a more formal exercise program.  The patient should continue antiplatelet therapy and aggressive treatment of the lipid abnormalities  No changes in the patient's medications at this time  Continued surveillance is indicated as atherosclerosis is likely to progress with time.    The patient will continue follow up with noninvasive studies as  ordered.   The patient has had some slightly worsening discomfort so we will have him follow-up a little bit sooner noninvasive studies - VAS [place] ABI WITH/WO TBI; Future - VAS [place] LOWER EXTREMITY ARTERIAL DUPLEX; Future  2. Essential hypertension Continue antihypertensive medications as already ordered, these medications have been reviewed and there are no changes at this time.  3. Type 2 diabetes mellitus with vascular disease (HCC) Continue hypoglycemic medications as already ordered, these medications have been reviewed and there are no changes at this time.  Hgb A1C to be monitored as already arranged by primary service   Current Outpatient Medications on File Prior to Visit  Medication Sig Dispense Refill   apixaban (ELIQUIS) 2.5 MG TABS tablet Take 1 tablet (2.5 mg total) by mouth 2 (two) times daily. 60 tablet 3   atorvastatin (LIPITOR) 80 MG tablet Take 1 tablet (80 mg total) by mouth daily. 90 tablet 3   cetirizine (ZYRTEC) 5 MG tablet Take 1 tablet (5 mg total) by mouth daily. 90 tablet 3   Continuous Blood Gluc Sensor (FREESTYLE LIBRE 2 SENSOR) MISC Use to check glucose at least 4 times daily 6 each 3   Continuous Glucose Sensor (FREESTYLE LIBRE 3 PLUS SENSOR) MISC Change sensor every 15 days. 6 each 3   ezetimibe (ZETIA) 10 MG tablet Take 1 tablet (10 mg total) by mouth daily. 90 tablet 3   Fish Oil-Vitamin D 1000-1000 MG-UNIT CAPS Take 1 capsule by mouth daily.     furosemide (LASIX) 20 MG tablet Take 20 mg by mouth daily as needed for edema or fluid.     gabapentin (NEURONTIN) 300 MG capsule Take 1 capsule (300 mg total) by mouth 3 (three) times  daily. 270 capsule 1   insulin aspart (NOVOLOG) 100 UNIT/ML FlexPen Inject 10-16 Units into the skin 3 (three) times daily with meals. 30 mL 3   insulin degludec (TRESIBA FLEXTOUCH) 100 UNIT/ML FlexTouch Pen Inject 20 Units into the skin at bedtime. 18 mL 3   isosorbide mononitrate (IMDUR) 60 MG 24 hr tablet Take 1 tablet (60 mg total)  by mouth daily. 90 tablet 1   levothyroxine (SYNTHROID) 88 MCG tablet Take 1 tablet (88 mcg total) by mouth daily before breakfast. 90 tablet 3   metoprolol succinate (TOPROL-XL) 25 MG 24 hr tablet Take 1 tablet (25 mg total) by mouth daily. 30 tablet 11   spironolactone (ALDACTONE) 25 MG tablet Take 1 tablet (25 mg total) by mouth daily. 30 tablet 11   tamsulosin (FLOMAX) 0.4 MG CAPS capsule Take 1 capsule (0.4 mg total) by mouth in the morning and at bedtime. 180 capsule 3   triamcinolone cream (KENALOG) 0.1 % apply cream externally to affected area twice dailiy. 15 g 1   No current facility-administered medications on file prior to visit.    There are no Patient Instructions on file for this visit. No follow-ups on file.   Georgiana Spinner, NP

## 2023-03-29 LAB — VAS US ABI WITH/WO TBI
Left ABI: 0.9
Right ABI: 0.82

## 2023-04-02 ENCOUNTER — Other Ambulatory Visit (HOSPITAL_COMMUNITY): Payer: Self-pay

## 2023-04-05 ENCOUNTER — Other Ambulatory Visit (HOSPITAL_COMMUNITY): Payer: Self-pay

## 2023-04-11 ENCOUNTER — Other Ambulatory Visit: Payer: Self-pay

## 2023-04-16 ENCOUNTER — Other Ambulatory Visit: Payer: Self-pay | Admitting: Nurse Practitioner

## 2023-04-16 DIAGNOSIS — E1142 Type 2 diabetes mellitus with diabetic polyneuropathy: Secondary | ICD-10-CM

## 2023-04-17 ENCOUNTER — Other Ambulatory Visit: Payer: Self-pay

## 2023-04-17 ENCOUNTER — Other Ambulatory Visit (HOSPITAL_COMMUNITY): Payer: Self-pay

## 2023-04-17 ENCOUNTER — Ambulatory Visit (INDEPENDENT_AMBULATORY_CARE_PROVIDER_SITE_OTHER): Payer: PPO

## 2023-04-17 VITALS — Ht 78.0 in | Wt 233.0 lb

## 2023-04-17 DIAGNOSIS — E119 Type 2 diabetes mellitus without complications: Secondary | ICD-10-CM

## 2023-04-17 DIAGNOSIS — Z794 Long term (current) use of insulin: Secondary | ICD-10-CM | POA: Diagnosis not present

## 2023-04-17 DIAGNOSIS — Z0000 Encounter for general adult medical examination without abnormal findings: Secondary | ICD-10-CM | POA: Diagnosis not present

## 2023-04-17 DIAGNOSIS — E1159 Type 2 diabetes mellitus with other circulatory complications: Secondary | ICD-10-CM

## 2023-04-17 MED ORDER — GABAPENTIN 300 MG PO CAPS
300.0000 mg | ORAL_CAPSULE | Freq: Three times a day (TID) | ORAL | 1 refills | Status: DC
  Filled 2023-04-17 – 2023-04-21 (×2): qty 270, 90d supply, fill #0
  Filled 2023-07-23: qty 270, 90d supply, fill #1

## 2023-04-17 NOTE — Progress Notes (Signed)
 Because this visit was a virtual/telehealth visit,  certain criteria was not obtained, such a blood pressure, CBG if applicable, and timed get up and go. Any medications not marked as "taking" were not mentioned during the medication reconciliation part of the visit. Any vitals not documented were not able to be obtained due to this being a telehealth visit or patient was unable to self-report a recent blood pressure reading due to a lack of equipment at home via telehealth. Vitals that have been documented are verbally provided by the patient.   Subjective:   Dylan Robles. is a 83 y.o. who presents for a Medicare Wellness preventive visit.  Visit Complete: Virtual I connected with  Dylan Robles. on 04/17/23 by a audio enabled telemedicine application and verified that I am speaking with the correct person using two identifiers.  Patient Location: Home  Provider Location: Home Office  I discussed the limitations of evaluation and management by telemedicine. The patient expressed understanding and agreed to proceed.  Vital Signs: Because this visit was a virtual/telehealth visit, some criteria may be missing or patient reported. Any vitals not documented were not able to be obtained and vitals that have been documented are patient reported.  VideoDeclined- This patient declined Librarian, academic. Therefore the visit was completed with audio only.  Persons Participating in Visit: Patient.  AWV Questionnaire: No: Patient Medicare AWV questionnaire was not completed prior to this visit.  Cardiac Risk Factors include: advanced age (>47men, >64 women);diabetes mellitus;male gender;sedentary lifestyle;hypertension;dyslipidemia     Objective:    Today's Vitals   04/17/23 1443  Weight: 233 lb (105.7 kg)  Height: 6\' 6"  (1.981 m)   Body mass index is 26.93 kg/m.     04/17/2023    2:51 PM 02/01/2023   12:30 PM 02/01/2023   12:18 PM 01/30/2023     1:49 PM 08/30/2022    3:12 PM 08/20/2022   11:08 PM 08/20/2022    8:01 PM  Advanced Directives  Does Patient Have a Medical Advance Directive? No  No Unable to assess, patient is non-responsive or altered mental status No  No  Would patient like information on creating a medical advance directive? No - Patient declined No - Patient declined   No - Patient declined Yes (Inpatient - patient requests chaplain consult to create a medical advance directive)     Current Medications (verified) Outpatient Encounter Medications as of 04/17/2023  Medication Sig   apixaban (ELIQUIS) 2.5 MG TABS tablet Take 1 tablet (2.5 mg total) by mouth 2 (two) times daily.   atorvastatin (LIPITOR) 80 MG tablet Take 1 tablet (80 mg total) by mouth daily.   cetirizine (ZYRTEC) 5 MG tablet Take 1 tablet (5 mg total) by mouth daily.   Continuous Blood Gluc Sensor (FREESTYLE LIBRE 2 SENSOR) MISC Use to check glucose at least 4 times daily   Continuous Glucose Sensor (FREESTYLE LIBRE 3 PLUS SENSOR) MISC Change sensor every 15 days.   ezetimibe (ZETIA) 10 MG tablet Take 1 tablet (10 mg total) by mouth daily.   Fish Oil-Vitamin D 1000-1000 MG-UNIT CAPS Take 1 capsule by mouth daily.   furosemide (LASIX) 20 MG tablet Take 20 mg by mouth daily as needed for edema or fluid.   gabapentin (NEURONTIN) 300 MG capsule Take 1 capsule (300 mg total) by mouth 3 (three) times daily.   insulin aspart (NOVOLOG) 100 UNIT/ML FlexPen Inject 10-16 Units into the skin 3 (three) times daily with meals.  insulin degludec (TRESIBA FLEXTOUCH) 100 UNIT/ML FlexTouch Pen Inject 20 Units into the skin at bedtime.   isosorbide mononitrate (IMDUR) 60 MG 24 hr tablet Take 1 tablet (60 mg total) by mouth daily.   levothyroxine (SYNTHROID) 88 MCG tablet Take 1 tablet (88 mcg total) by mouth daily before breakfast.   metoprolol succinate (TOPROL-XL) 25 MG 24 hr tablet Take 1 tablet (25 mg total) by mouth daily.   spironolactone (ALDACTONE) 25 MG tablet  Take 1 tablet (25 mg total) by mouth daily.   tamsulosin (FLOMAX) 0.4 MG CAPS capsule Take 1 capsule (0.4 mg total) by mouth in the morning and at bedtime.   triamcinolone cream (KENALOG) 0.1 % apply cream externally to affected area twice dailiy.   No facility-administered encounter medications on file as of 04/17/2023.    Allergies (verified) Plavix [clopidogrel]   History: Past Medical History:  Diagnosis Date   Acute lower UTI 09/20/2017   Bradycardia on ECG 07/31/2011   Coronary artery disease    hyperlipidemia   Decreased GFR 09/23/2019   Dehydration 09/21/2017   Diabetes mellitus    Diabetes mellitus without complication (HCC)    Hypercholesterolemia    Hypertension    Hypothyroidism    Left arm pain 01/29/2020   Paronychia of great toe of left foot 09/26/2018   Paronychia of great toe, left 09/26/2018   PERIPHERAL NEUROPATHY 01/13/2006   Qualifier: Diagnosis of  By: Jen Mow MD, Christine     Shortness of breath dyspnea    with exertion   Sleep apnea    Stroke (HCC) 08/10/2022   Vasovagal syncope    Weakness 09/20/2017   Past Surgical History:  Procedure Laterality Date   APPENDECTOMY     CARDIAC CATHETERIZATION N/A 06/16/2014   Procedure: Left Heart Cath;  Surgeon: Laurier Nancy, MD;  Location: [facility];  Service: Cardiovascular;  Laterality: N/A;   CIRCUMCISION, NON-NEWBORN     CYSTOSCOPY N/A 02/01/2023   Procedure: CYSTOSCOPY;  Surgeon: Malen Gauze, MD;  Location: [facility];  Service: Urology;  Laterality: N/A;   HERNIA REPAIR     LOWER EXTREMITY ANGIOGRAPHY Left 12/24/2018   Procedure: LOWER EXTREMITY ANGIOGRAPHY;  Surgeon: Renford Dills, MD;  Location: [facility];  Service: Cardiovascular;  Laterality: Left;   THYROID SURGERY     TRANSURETHRAL RESECTION OF PROSTATE N/A 02/01/2023   Procedure: TRANSURETHRAL RESECTION OF THE PROSTATE (TURP);  Surgeon: Malen Gauze, MD;  Location: [facility];  Service: Urology;  Laterality: N/A;    Family History  Problem Relation Age of Onset   Diabetes Father    Hypertension Father    Cancer Mother    Diabetes Brother    Social History   Socioeconomic History   Marital status: Married    Spouse name: Not on file   Number of children: Not on file   Years of education: Not on file   Highest education level: Not on file  Occupational History   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  Tobacco Use   Smoking status: Former   Smokeless tobacco: Never  Advertising account planner   Vaping status: Never Used  Substance and Sexual Activity   Alcohol use: No   Drug use: No   Sexual activity: Not on file  Other Topics Concern   Not on file  Social History Narrative   ** Merged History Encounter **       Social Drivers of Corporate investment banker Strain: Low Risk  (  04/17/2023)   Overall Financial Resource Strain (CARDIA)    Difficulty of Paying Living Expenses: Not hard at all  Food Insecurity: No Food Insecurity (04/17/2023)   Hunger Vital Sign    Worried About Running Out of Food in the Last Year: Never true    Ran Out of Food in the Last Year: Never true  Transportation Needs: No Transportation Needs (04/17/2023)   PRAPARE - Administrator, Civil Service (Medical): No    Lack of Transportation (Non-Medical): No  Physical Activity: Insufficiently Active (04/17/2023)   Exercise Vital Sign    Days of Exercise per Week: 7 days    Minutes of Exercise per Session: 20 min  Stress: No Stress Concern Present (04/17/2023)   [employer] of Occupational Health - Occupational Stress Questionnaire    Feeling of Stress : Not at all  Social Connections: Moderately Isolated (04/17/2023)   Social Connection and Isolation Panel [NHANES]    Frequency of Communication with Friends and Family: More than three times a week    Frequency of Social Gatherings with Friends and Family: More than three times a week    Attends Religious Services: Never    Database administrator or  Organizations: No    Attends Engineer, structural: Never    Marital Status: Married    Tobacco Counseling Counseling given: Yes    Clinical Intake:  Pre-visit preparation completed: Yes  Pain : No/denies pain     BMI - recorded: 26.93 Nutritional Status: BMI 25 -29 Overweight Nutritional Risks: None Diabetes: Yes CBG done?: No Did pt. bring in CBG monitor from home?: No  Lab Results  Component Value Date   HGBA1C 10.2 (A) 01/26/2023   HGBA1C 10.1 (H) 08/21/2022   HGBA1C 10.1 (A) 04/20/2022     How often do you need to have someone help you when you read instructions, pamphlets, or other written materials from your doctor or pharmacy?: 4 - Often (due to stroke)  Interpreter Needed?: No  Information entered by :: Maryjean Ka CMA   Activities of Daily Living     04/17/2023    2:49 PM 02/01/2023    7:12 AM  In your present state of health, do you have any difficulty performing the following activities:  Hearing? 0 0  Vision? 0 0  Difficulty concentrating or making decisions? 0 0  Walking or climbing stairs? 1   Comment uses a walker due to a stroke   Dressing or bathing? 0   Doing errands, shopping? 0   Preparing Food and eating ? Y   Using the Toilet? N   In the past six months, have you accidently leaked urine? N   Do you have problems with loss of bowel control? N   Managing your Medications? N   Managing your Finances? N   Housekeeping or managing your Housekeeping? N     Patient Care Team: Billie Lade, MD as PCP - General (Internal Medicine) Jeanice Lim, Velna Hatchet, MD (Family Medicine) Erroll Luna, [facility] (Inactive) as Pharmacist (Pharmacist)  Indicate any recent Medical Services you may have received from other than [facility] providers in the past year (date may be approximate).     Assessment:   This is a routine wellness examination for J[place].  Hearing/Vision screen Hearing Screening - Comments:: Patient denies any hearing difficulties.    Vision Screening - Comments:: Patient wears reading glasses only. Up to date with yearly exams.  Sees [facility] in [place]  Goals Addressed             This Visit's Progress    Patient Stated       To work on building my strength up       Depression Screen     04/17/2023    2:54 PM 03/15/2023   11:09 AM 12/04/2022    3:56 PM 09/01/2022    4:15 PM 06/14/2022    1:30 PM 04/03/2022    8:11 AM 03/13/2022    1:51 PM  PHQ 2/9 Scores  PHQ - 2 Score 0 0 0 0 0 0 0  PHQ- 9 Score 0   0   1    Fall Risk     04/17/2023    2:53 PM 03/15/2023   11:09 AM 12/04/2022    3:55 PM 09/01/2022    4:15 PM 06/14/2022    1:30 PM  Fall Risk   Falls in the past year? 0 0 0 0 0  Number falls in past yr: 0 0 0  0  Injury with Fall? 0 0 0  0  Risk for fall due to : History of fall(s);Impaired balance/gait;Orthopedic patient;Impaired mobility;Other (Comment) No Fall Risks No Fall Risks    Risk for fall due to: Comment history of stroke with impaired balance.      Follow up Falls prevention discussed;Falls evaluation completed Falls evaluation completed;Education provided;Falls prevention discussed Falls evaluation completed      MEDICARE RISK AT HOME:  Medicare Risk at Home Any stairs in or around the home?: Yes If so, are there any without handrails?: No Home free of loose throw rugs in walkways, pet beds, electrical cords, etc?: Yes Adequate lighting in your home to reduce risk of falls?: Yes Life alert?: No Use of a cane, walker or w/c?: Yes Grab bars in the bathroom?: Yes Shower chair or bench in shower?: Yes Elevated toilet seat or a handicapped toilet?: Yes  TIMED UP AND GO:  Was the test performed?  No  Cognitive Function: Declined: Patient declined cognitive screening, but was able to answer questions in an accurate and timely manner. No cognitive impairments observed.        04/17/2023    2:54 PM 04/03/2022    8:12 AM 03/29/2021    8:21 AM  6CIT Screen  What Year? 0  points 0 points 0 points  What month? 0 points 0 points 0 points  What time? 0 points 0 points 0 points  Count back from 20 0 points 0 points 0 points  Months in reverse 0 points 0 points 0 points  Repeat phrase 0 points 0 points 0 points  Total Score 0 points 0 points 0 points    Immunizations Immunization History  Administered Date(s) Administered   Fluad Quad(high Dose 65+) 09/22/2019, 11/03/2020, 11/10/2021   Fluad Trivalent(High Dose 65+) 12/04/2022   Influenza Whole 10/05/2005, 12/17/2006   Influenza,inj,quad, With Preservative 11/25/2018   Moderna Sars-Covid-2 Vaccination 03/06/2019, 04/03/2019, 01/06/2020   Pneumococcal Conjugate-13 01/29/2020   Pneumococcal Polysaccharide-23 08/21/2005   Td 12/17/2006   Tdap 06/10/2021   Zoster Recombinant(Shingrix) 06/03/2021, 09/20/2021    Screening Tests Health Maintenance  Topic Date Due   COVID-19 Vaccine (4 - 2024-25 season) 09/10/2022   Diabetic kidney evaluation - Urine ACR  04/19/2023 (Originally 11/11/2022)   HEMOGLOBIN A1C  04/26/2023   OPHTHALMOLOGY EXAM  05/23/2023   INFLUENZA VACCINE  08/10/2023   FOOT EXAM  10/23/2023   Diabetic kidney evaluation - eGFR measurement  03/14/2024  Medicare Annual Wellness (AWV)  04/16/2024   DTaP/Tdap/Td (3 - Td or Tdap) 06/11/2031   Pneumonia Vaccine 69+ Years old  Completed   Zoster Vaccines- Shingrix  Completed   HPV VACCINES  Aged Out    Health Maintenance  Health Maintenance Due  Topic Date Due   COVID-19 Vaccine (4 - 2024-25 season) 09/10/2022   Health Maintenance Items Addressed: A1C, UACR (Urine Albumin:Creatinine Ratio)  Additional Screening:  Vision Screening: Recommended annual ophthalmology exams for early detection of glaucoma and other disorders of the eye.  Dental Screening: Recommended annual dental exams for proper oral hygiene  Community Resource Referral / Chronic Care Management: CRR required this visit?  No   CCM required this visit?  No      Plan:     I have personally reviewed and noted the following in the patient's chart:   Medical and social history Use of alcohol, tobacco or illicit drugs  Current medications and supplements including opioid prescriptions. Patient is not currently taking opioid prescriptions. Functional ability and status Nutritional status Physical activity Advanced directives List of other physicians Hospitalizations, surgeries, and ER visits in previous 12 months Vitals Screenings to include cognitive, depression, and falls Referrals and appointments  In addition, I have reviewed and discussed with patient certain preventive protocols, quality metrics, and best practice recommendations. A written personalized care plan for preventive services as well as general preventive health recommendations were provided to patient.     Jordan Hawks Arta Stump, CMA   04/17/2023   After Visit Summary: (MyChart) Due to this being a telephonic visit, the after visit summary with patients personalized plan was offered to patient via MyChart   Notes: Please refer to Routing Comments.

## 2023-04-17 NOTE — Patient Instructions (Signed)
 [name] , Thank you for taking time to come for your Medicare Wellness Visit. I appreciate your ongoing commitment to your health goals. Please review the following plan we discussed and let me know if I can assist you in the future.   Please see your treatment plan below: Labs:Hemoglobin A1C and Diabetic Urine Screening-Go to the lab at [facility] to have these done Follow-Up: Medicare Annual Wellness Visit April 21, 2024 at 1:50pm video visit Clinician Recommendations: Aim for 30 minutes of exercise or brisk walking, 6-8 glasses of water, and 5 servings of fruits and vegetables each day.  This is a list of the screening recommended for you and due dates:  Health Maintenance  Topic Date Due   COVID-19 Vaccine (4 - 2024-25 season) 09/10/2022   Yearly kidney health urinalysis for diabetes  04/19/2023*   Hemoglobin A1C  04/26/2023   Eye exam for diabetics  05/23/2023   Flu Shot  08/10/2023   Complete foot exam   10/23/2023   Yearly kidney function blood test for diabetes  03/14/2024   Medicare Annual Wellness Visit  04/16/2024   DTaP/Tdap/Td vaccine (3 - Td or Tdap) 06/11/2031   Pneumonia Vaccine  Completed   Zoster (Shingles) Vaccine  Completed   HPV Vaccine  Aged Out  *Topic was postponed. The date shown is not the original due date.    Advanced directives: (Declined) Advance directive discussed with you today. Even though you declined this today, please call our office should you change your mind, and we can give you the proper paperwork for you to fill out.   Advance Care Planning is important because it:  [x]  Makes sure you receive the medical care that is consistent with your values, goals, and preferences  [x]  It provides guidance to your family and loved ones and it also reduces their decisional burden about whether or not they are making the right decisions based on what you want done  Follow the link provided in your after visit summary or read over the  paperwork we have mailed to you to help you started getting your Advance Directives in place. If you need assistance in completing these, please reach out to [place] so that we can help you!   Next Medicare Annual Wellness Visit scheduled for next year: yes  Understanding Your Risk for Falls Millions of people have serious injuries from falls each year. It is important to understand your risk of falling. Talk with your health care provider about your risk and what you can do to lower it. If you do have a serious fall, make sure to tell your provider. Falling once raises your risk of falling again. How can falls affect me? Serious injuries from falls are common. These include: Broken bones, such as hip fractures. Head injuries, such as traumatic brain injuries (TBI) or concussions. A fear of falling can cause you to avoid activities and stay at home. This can make your muscles weaker and raise your risk for a fall. What can increase my risk? There are a number of risk factors that increase your risk for falling. The more risk factors you have, the higher your risk of falling. Serious injuries from a fall happen most often to people who are older than 85 years old. Teenagers and young adults ages 42-29 are also at higher risk. Common risk factors include: Weakness in the lower body. Being generally weak or confused due to long-term (chronic) illness. Dizziness or balance problems. Poor vision. Medicines that  cause dizziness or drowsiness. These may include: Medicines for your blood pressure, heart, anxiety, insomnia, or swelling (edema). Pain medicines. Muscle relaxants. Other risk factors include: Drinking alcohol. Having had a fall in the past. Having foot pain or wearing improper footwear. Working at a dangerous job. Having any of the following in your home: Tripping hazards, such as floor clutter or loose rugs. Poor lighting. Pets. Having dementia or memory loss. What actions can I take  to lower my risk of falling?     Physical activity Stay physically fit. Do strength and balance exercises. Consider taking a regular class to build strength and balance. Yoga and tai chi are good options. Vision Have your eyes checked every year and your prescription for glasses or contacts updated as needed. Shoes and walking aids Wear non-skid shoes. Wear shoes that have rubber soles and low heels. Do not wear high heels. Do not walk around the house in socks or slippers. Use a cane or walker as told by your provider. Home safety Attach secure railings on both sides of your stairs. Install grab bars for your bathtub, shower, and toilet. Use a non-skid mat in your bathtub or shower. Attach bath mats securely with double-sided, non-slip rug tape. Use good lighting in all rooms. Keep a flashlight near your bed. Make sure there is a clear path from your bed to the bathroom. Use night-lights. Do not use throw rugs. Make sure all carpeting is taped or tacked down securely. Remove all clutter from walkways and stairways, including extension cords. Repair uneven or broken steps and floors. Avoid walking on icy or slippery surfaces. Walk on the grass instead of on icy or slick sidewalks. Use ice melter to get rid of ice on walkways in the winter. Use a cordless phone. Questions to ask your health care provider Can you help me check my risk for a fall? Do any of my medicines make me more likely to fall? Should I take a vitamin D supplement? What exercises can I do to improve my strength and balance? Should I make an appointment to have my vision checked? Do I need a bone density test to check for weak bones (osteoporosis)? Would it help to use a cane or a walker? Where to find more information Centers for Disease Control and Prevention, STEADI: TonerPromos.no Community-Based Fall Prevention Programs: TonerPromos.no [employer] on Aging: BaseRingTones.pl Contact a health care provider if: You fall at  home. You are afraid of falling at home. You feel weak, drowsy, or dizzy. This information is not intended to replace advice given to you by your health care provider. Make sure you discuss any questions you have with your health care provider. Document Revised: 08/29/2021 Document Reviewed: 08/29/2021 Elsevier Patient Education  2024 [employer].

## 2023-04-18 ENCOUNTER — Telehealth: Payer: Self-pay

## 2023-04-18 ENCOUNTER — Other Ambulatory Visit (HOSPITAL_COMMUNITY): Payer: Self-pay

## 2023-04-18 NOTE — Telephone Encounter (Signed)
 Patient was identified as falling into the [place] Measure - Diabetes.   Patient was: Appointment already scheduled for:  06/08/23.

## 2023-04-20 DIAGNOSIS — Z794 Long term (current) use of insulin: Secondary | ICD-10-CM | POA: Diagnosis not present

## 2023-04-20 DIAGNOSIS — E1122 Type 2 diabetes mellitus with diabetic chronic kidney disease: Secondary | ICD-10-CM | POA: Diagnosis not present

## 2023-04-20 DIAGNOSIS — E039 Hypothyroidism, unspecified: Secondary | ICD-10-CM | POA: Diagnosis not present

## 2023-04-20 DIAGNOSIS — N1832 Chronic kidney disease, stage 3b: Secondary | ICD-10-CM | POA: Diagnosis not present

## 2023-04-21 ENCOUNTER — Other Ambulatory Visit (HOSPITAL_COMMUNITY): Payer: Self-pay

## 2023-04-21 ENCOUNTER — Other Ambulatory Visit (HOSPITAL_BASED_OUTPATIENT_CLINIC_OR_DEPARTMENT_OTHER): Payer: Self-pay

## 2023-04-21 LAB — COMPREHENSIVE METABOLIC PANEL WITH GFR
ALT: 26 IU/L (ref 0–44)
AST: 20 IU/L (ref 0–40)
Albumin: 4.1 g/dL (ref 3.7–4.7)
Alkaline Phosphatase: 163 IU/L — ABNORMAL HIGH (ref 44–121)
BUN/Creatinine Ratio: 14 (ref 10–24)
BUN: 27 mg/dL (ref 8–27)
Bilirubin Total: 0.8 mg/dL (ref 0.0–1.2)
CO2: 22 mmol/L (ref 20–29)
Calcium: 10.1 mg/dL (ref 8.6–10.2)
Chloride: 101 mmol/L (ref 96–106)
Creatinine, Ser: 1.92 mg/dL — ABNORMAL HIGH (ref 0.76–1.27)
Globulin, Total: 2.3 g/dL (ref 1.5–4.5)
Glucose: 173 mg/dL — ABNORMAL HIGH (ref 70–99)
Potassium: 4.3 mmol/L (ref 3.5–5.2)
Sodium: 139 mmol/L (ref 134–144)
Total Protein: 6.4 g/dL (ref 6.0–8.5)
eGFR: 34 mL/min/{1.73_m2} — ABNORMAL LOW (ref >59)

## 2023-04-21 LAB — TSH: TSH: 2.68 u[IU]/mL (ref 0.450–4.500)

## 2023-04-21 LAB — T4, FREE: Free T4: 1.36 ng/dL (ref 0.82–1.77)

## 2023-04-23 ENCOUNTER — Ambulatory Visit: Payer: PPO | Admitting: Podiatry

## 2023-04-23 ENCOUNTER — Other Ambulatory Visit (HOSPITAL_COMMUNITY): Payer: Self-pay

## 2023-04-23 ENCOUNTER — Encounter: Payer: Self-pay | Admitting: Podiatry

## 2023-04-23 VITALS — Ht 78.0 in | Wt 233.0 lb

## 2023-04-23 DIAGNOSIS — I739 Peripheral vascular disease, unspecified: Secondary | ICD-10-CM | POA: Diagnosis not present

## 2023-04-23 DIAGNOSIS — E1142 Type 2 diabetes mellitus with diabetic polyneuropathy: Secondary | ICD-10-CM

## 2023-04-23 DIAGNOSIS — M79674 Pain in right toe(s): Secondary | ICD-10-CM | POA: Diagnosis not present

## 2023-04-23 DIAGNOSIS — B351 Tinea unguium: Secondary | ICD-10-CM | POA: Diagnosis not present

## 2023-04-23 DIAGNOSIS — M79675 Pain in left toe(s): Secondary | ICD-10-CM | POA: Diagnosis not present

## 2023-04-24 ENCOUNTER — Other Ambulatory Visit (HOSPITAL_COMMUNITY): Payer: Self-pay

## 2023-04-24 ENCOUNTER — Other Ambulatory Visit: Payer: Self-pay

## 2023-04-26 ENCOUNTER — Encounter: Payer: Self-pay | Admitting: Nurse Practitioner

## 2023-04-26 ENCOUNTER — Other Ambulatory Visit: Payer: Self-pay

## 2023-04-26 ENCOUNTER — Other Ambulatory Visit (HOSPITAL_COMMUNITY): Payer: Self-pay

## 2023-04-26 ENCOUNTER — Ambulatory Visit: Payer: PPO | Admitting: Nurse Practitioner

## 2023-04-26 ENCOUNTER — Other Ambulatory Visit: Payer: Self-pay | Admitting: *Deleted

## 2023-04-26 VITALS — BP 136/70 | HR 66 | Ht 78.0 in | Wt 238.4 lb

## 2023-04-26 DIAGNOSIS — N1832 Chronic kidney disease, stage 3b: Secondary | ICD-10-CM | POA: Diagnosis not present

## 2023-04-26 DIAGNOSIS — Z794 Long term (current) use of insulin: Secondary | ICD-10-CM

## 2023-04-26 DIAGNOSIS — E1142 Type 2 diabetes mellitus with diabetic polyneuropathy: Secondary | ICD-10-CM

## 2023-04-26 DIAGNOSIS — E1122 Type 2 diabetes mellitus with diabetic chronic kidney disease: Secondary | ICD-10-CM

## 2023-04-26 DIAGNOSIS — E039 Hypothyroidism, unspecified: Secondary | ICD-10-CM

## 2023-04-26 LAB — POCT GLYCOSYLATED HEMOGLOBIN (HGB A1C)
Hemoglobin A1C: 11.3 % — AB (ref 4.0–5.6)
Hemoglobin A1C: 11.3 % — AB (ref 4.0–5.6)

## 2023-04-26 MED ORDER — FREESTYLE LIBRE 3 READER DEVI
0 refills | Status: DC
  Filled 2023-04-26: qty 1, 1d supply, fill #0

## 2023-04-26 MED ORDER — TRESIBA FLEXTOUCH 100 UNIT/ML ~~LOC~~ SOPN
25.0000 [IU] | PEN_INJECTOR | Freq: Every day | SUBCUTANEOUS | 3 refills | Status: DC
  Filled 2023-04-26: qty 21, 84d supply, fill #0
  Filled 2023-07-14: qty 21, 84d supply, fill #1
  Filled 2023-10-06: qty 21, 84d supply, fill #2

## 2023-04-26 MED ORDER — FREESTYLE LIBRE 3 PLUS SENSOR MISC
3 refills | Status: DC
  Filled 2023-04-26: qty 6, 90d supply, fill #0
  Filled 2023-04-27 – 2023-07-23 (×2): qty 6, 90d supply, fill #1
  Filled 2023-10-19: qty 6, 90d supply, fill #2

## 2023-04-26 NOTE — Progress Notes (Signed)
 Endocrinology Follow Up Note       04/26/2023, 11:45 AM   Subjective:    Patient ID: Dylan Robles., male    DOB: Mar 28, 1940.  Dylan Robles. is being seen in follow up after being seen in consultation for management of currently uncontrolled symptomatic diabetes requested by  Tobi Fortes, MD.   Past Medical History:  Diagnosis Date   Acute lower UTI 09/20/2017   Bradycardia on ECG 07/31/2011   Coronary artery disease    hyperlipidemia   Decreased GFR 09/23/2019   Dehydration 09/21/2017   Diabetes mellitus    Diabetes mellitus without complication (HCC)    Hypercholesterolemia    Hypertension    Hypothyroidism    Left arm pain 01/29/2020   Paronychia of great toe of left foot 09/26/2018   Paronychia of great toe, left 09/26/2018   PERIPHERAL NEUROPATHY 01/13/2006   Qualifier: Diagnosis of  By: Seabron Cypress MD, Christine     Shortness of breath dyspnea    with exertion   Sleep apnea    Stroke (HCC) 08/10/2022   Vasovagal syncope    Weakness 09/20/2017    Past Surgical History:  Procedure Laterality Date   APPENDECTOMY     CARDIAC CATHETERIZATION N/A 06/16/2014   Procedure: Left Heart Cath;  Surgeon: Cherrie Cornwall, MD;  Location: [facility];  Service: Cardiovascular;  Laterality: N/A;   CIRCUMCISION, NON-NEWBORN     CYSTOSCOPY N/A 02/01/2023   Procedure: CYSTOSCOPY;  Surgeon: Marco Severs, MD;  Location: [facility];  Service: Urology;  Laterality: N/A;   HERNIA REPAIR     LOWER EXTREMITY ANGIOGRAPHY Left 12/24/2018   Procedure: LOWER EXTREMITY ANGIOGRAPHY;  Surgeon: Jackquelyn Mass, MD;  Location: [facility];  Service: Cardiovascular;  Laterality: Left;   THYROID SURGERY     TRANSURETHRAL RESECTION OF PROSTATE N/A 02/01/2023   Procedure: TRANSURETHRAL RESECTION OF THE PROSTATE (TURP);  Surgeon: Marco Severs, MD;  Location: [facility];  Service: Urology;   Laterality: N/A;    Social History   Socioeconomic History   Marital status: Married    Spouse name: Not on file   Number of children: Not on file   Years of education: Not on file   Highest education level: Not on file  Occupational History   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  Tobacco Use   Smoking status: Former   Smokeless tobacco: Never  Advertising account planner   Vaping status: Never Used  Substance and Sexual Activity   Alcohol use: No   Drug use: No   Sexual activity: Not on file  Other Topics Concern   Not on file  Social History Narrative   ** Merged History Encounter **       Social Drivers of Health   Financial Resource Strain: Low Risk  (04/17/2023)   Overall Financial Resource Strain (CARDIA)    Difficulty of Paying Living Expenses: Not hard at all  Food Insecurity: No Food Insecurity (04/17/2023)   Hunger Vital Sign    Worried About Running Out of Food in the Last Year: Never true    Ran Out of Food in the Last Year:  Never true  Transportation Needs: No Transportation Needs (04/17/2023)   PRAPARE - Administrator, Civil Service (Medical): No    Lack of Transportation (Non-Medical): No  Physical Activity: Insufficiently Active (04/17/2023)   Exercise Vital Sign    Days of Exercise per Week: 7 days    Minutes of Exercise per Session: 20 min  Stress: No Stress Concern Present (04/17/2023)   [employer] of Occupational Health - Occupational Stress Questionnaire    Feeling of Stress : Not at all  Social Connections: Moderately Isolated (04/17/2023)   Social Connection and Isolation Panel [NHANES]    Frequency of Communication with Friends and Family: More than three times a week    Frequency of Social Gatherings with Friends and Family: More than three times a week    Attends Religious Services: Never    Database administrator or Organizations: No    Attends Banker Meetings: Never    Marital Status: Married    Family History   Problem Relation Age of Onset   Diabetes Father    Hypertension Father    Cancer Mother    Diabetes Brother     Outpatient Encounter Medications as of 04/26/2023  Medication Sig   apixaban (ELIQUIS) 2.5 MG TABS tablet Take 1 tablet (2.5 mg total) by mouth 2 (two) times daily.   atorvastatin (LIPITOR) 80 MG tablet Take 1 tablet (80 mg total) by mouth daily.   cetirizine (ZYRTEC) 5 MG tablet Take 1 tablet (5 mg total) by mouth daily.   Continuous Blood Gluc Sensor (FREESTYLE LIBRE 2 SENSOR) MISC Use to check glucose at least 4 times daily   Continuous Glucose Sensor (FREESTYLE LIBRE 3 PLUS SENSOR) MISC Change sensor every 15 days.   ezetimibe (ZETIA) 10 MG tablet Take 1 tablet (10 mg total) by mouth daily.   Fish Oil-Vitamin D 1000-1000 MG-UNIT CAPS Take 1 capsule by mouth daily.   furosemide (LASIX) 20 MG tablet Take 20 mg by mouth daily as needed for edema or fluid.   gabapentin (NEURONTIN) 300 MG capsule Take 1 capsule (300 mg total) by mouth 3 (three) times daily.   insulin aspart (NOVOLOG) 100 UNIT/ML FlexPen Inject 10-16 Units into the skin 3 (three) times daily with meals.   isosorbide mononitrate (IMDUR) 60 MG 24 hr tablet Take 1 tablet (60 mg total) by mouth daily.   levothyroxine (SYNTHROID) 88 MCG tablet Take 1 tablet (88 mcg total) by mouth daily before breakfast.   metoprolol succinate (TOPROL-XL) 25 MG 24 hr tablet Take 1 tablet (25 mg total) by mouth daily.   spironolactone (ALDACTONE) 25 MG tablet Take 1 tablet (25 mg total) by mouth daily.   tamsulosin (FLOMAX) 0.4 MG CAPS capsule Take 1 capsule (0.4 mg total) by mouth in the morning and at bedtime.   triamcinolone cream (KENALOG) 0.1 % apply cream externally to affected area twice dailiy.   [DISCONTINUED] insulin degludec (TRESIBA FLEXTOUCH) 100 UNIT/ML FlexTouch Pen Inject 20 Units into the skin at bedtime.   insulin degludec (TRESIBA FLEXTOUCH) 100 UNIT/ML FlexTouch Pen Inject 25 Units into the skin at bedtime.   No  facility-administered encounter medications on file as of 04/26/2023.    ALLERGIES: Allergies  Allergen Reactions   Plavix [Clopidogrel] Hives    VACCINATION STATUS: Immunization History  Administered Date(s) Administered   Fluad Quad(high Dose 65+) 09/22/2019, 11/03/2020, 11/10/2021   Fluad Trivalent(High Dose 65+) 12/04/2022   Influenza Whole 10/05/2005, 12/17/2006   Influenza,inj,quad, With Preservative 11/25/2018  Moderna Sars-Covid-2 Vaccination 03/06/2019, 04/03/2019, 01/06/2020   Pneumococcal Conjugate-13 01/29/2020   Pneumococcal Polysaccharide-23 08/21/2005   Td 12/17/2006   Tdap 06/10/2021   Zoster Recombinant(Shingrix) 06/03/2021, 09/20/2021    Diabetes He presents for his follow-up diabetic visit. He has type 2 diabetes mellitus. Onset time: Diagnosed at approx age of 37. His disease course has been fluctuating. There are no hypoglycemic associated symptoms. Associated symptoms include foot paresthesias. There are no hypoglycemic complications. Symptoms are stable. Diabetic complications include heart disease, nephropathy, peripheral neuropathy and PVD. Risk factors for coronary artery disease include diabetes mellitus, dyslipidemia, family history, male sex and hypertension. Current diabetic treatment includes intensive insulin program. He is compliant with treatment most of the time. His weight is fluctuating minimally. He is following a generally healthy diet. Meal planning includes avoidance of concentrated sweets. He has had a previous visit with a dietitian. He participates in exercise intermittently. His home blood glucose trend is decreasing steadily. His overall blood glucose range is >200 mg/dl. (He presents today, accompanied by his wife, with his CGM showing drastically fluctuating glycemic profile.  His POCT A1c today is 11.3%, increasing from last visit of 10.2%.  Analysis of his CGM shows TIR 16%, TAR 84%, TBR 0% with a GMI of 9.3%.  He has missed several  opportunities to inject meal time insulin.) An ACE inhibitor/angiotensin II receptor blocker is being taken. He sees a podiatrist.Eye exam is current.     Review of systems  Constitutional: + Minimally fluctuating body weight, current Body mass index is 27.55 kg/m., no fatigue, no subjective hyperthermia, no subjective hypothermia Eyes: no blurry vision, no xerophthalmia ENT: no sore throat, no nodules palpated in throat, no dysphagia/odynophagia, no hoarseness Cardiovascular: no chest pain, no shortness of breath, no palpitations, no leg swelling Respiratory: no cough, no shortness of breath Gastrointestinal: no nausea/vomiting/diarrhea Musculoskeletal: no muscle/joint aches, walks with cane/walker Skin: no rashes, no hyperemia Neurological: no tremors, + numbness/tingling to BLE, no dizziness Psychiatric: no depression, no anxiety   Objective:     BP 136/70 (BP Location: Right Arm, Patient Position: Sitting, Cuff Size: Large)   Pulse 66   Ht 6\' 6"  (1.981 m)   Wt 238 lb 6.4 oz (108.1 kg)   BMI 27.55 kg/m   Wt Readings from Last 3 Encounters:  04/26/23 238 lb 6.4 oz (108.1 kg)  04/23/23 233 lb (105.7 kg)  04/17/23 233 lb (105.7 kg)     BP Readings from Last 3 Encounters:  04/26/23 136/70  03/27/23 135/71  03/19/23 (!) 108/58     Physical Exam- Limited  Constitutional:  Body mass index is 27.55 kg/m. , not in acute distress, normal state of mind Eyes:  EOMI, no exophthalmos Musculoskeletal: no gross deformities, strength intact in all four extremities, no gross restriction of joint movements, walks with cane/walker Skin:  no rashes, no hyperemia Neurological: no tremor with outstretched hands    CMP ( most recent) CMP     Component Value Date/Time   NA 139 04/20/2023 1022   K 4.3 04/20/2023 1022   CL 101 04/20/2023 1022   CO2 22 04/20/2023 1022   GLUCOSE 173 (H) 04/20/2023 1022   GLUCOSE 219 (H) 02/02/2023 0418   BUN 27 04/20/2023 1022   CREATININE 1.92  (H) 04/20/2023 1022   CREATININE 1.34 (H) 02/02/2021 0839   CALCIUM 10.1 04/20/2023 1022   PROT 6.4 04/20/2023 1022   ALBUMIN 4.1 04/20/2023 1022   AST 20 04/20/2023 1022   ALT 26 04/20/2023 1022  ALKPHOS 163 (H) 04/20/2023 1022   BILITOT 0.8 04/20/2023 1022   GFRNONAA 27 (L) 02/03/2023 0533   GFRNONAA 54 (L) 04/29/2020 1141   GFRAA 63 04/29/2020 1141     Diabetic Labs (most recent): Lab Results  Component Value Date   HGBA1C 11.3 (A) 04/26/2023   HGBA1C 10.2 (A) 01/26/2023   HGBA1C 10.1 (H) 08/21/2022   MICROALBUR 5.0 02/02/2021   MICROALBUR 1.4 09/22/2019   MICROALBUR 0.67 12/17/2006     Lipid Panel ( most recent) Lipid Panel     Component Value Date/Time   CHOL 126 08/21/2022 0502   CHOL 125 05/10/2022 0917   TRIG 74 08/21/2022 0502   HDL 34 (L) 08/21/2022 0502   HDL 46 05/10/2022 0917   CHOLHDL 3.7 08/21/2022 0502   VLDL 15 08/21/2022 0502   LDLCALC 77 08/21/2022 0502   LDLCALC 65 05/10/2022 0917   LDLCALC 73 11/03/2020 1032   LABVLDL 14 05/10/2022 0917      Lab Results  Component Value Date   TSH 2.680 04/20/2023   TSH 1.510 08/20/2022   TSH 1.730 05/10/2022   TSH 2.140 12/23/2021   TSH 1.790 06/14/2021   TSH 1.45 11/03/2020   TSH 1.75 03/31/2020   TSH 1.17 09/22/2019   TSH 0.94 03/19/2019   TSH 1.039 09/20/2017   FREET4 1.36 04/20/2023   FREET4 1.45 05/10/2022   FREET4 1.41 12/23/2021   FREET4 1.3 09/22/2019   FREET4 1.3 03/19/2019   FREET4 1.32 07/31/2011   FREET4 1.09 06/17/2008   FREET4 1.31 12/18/2007   FREET4 1.27 05/10/2006           Assessment & Plan:   1) Type 2 diabetes mellitus with stage 3a chronic kidney disease, with long-term current use of insulin (HCC)  He presents today, accompanied by his wife, with his CGM showing drastically fluctuating glycemic profile.  His POCT A1c today is 11.3%, increasing from last visit of 10.2%.  Analysis of his CGM shows TIR 16%, TAR 84%, TBR 0% with a GMI of 9.3%.  He has missed several  opportunities to inject meal time insulin.  - Dylan Robles. has currently uncontrolled symptomatic type 2 DM since 83 years of age.   -Recent labs reviewed.  - I had a long discussion with him about the progressive nature of diabetes and the pathology behind its complications. -his diabetes is complicated by CAD, CKD, PVD, neuropathy and he remains at a high risk for more acute and chronic complications which include CAD, CVA, CKD, retinopathy, and neuropathy. These are all discussed in detail with him.  The following Lifestyle Medicine recommendations according to American College of Lifestyle Medicine [facility]) were discussed and offered to patient and he agrees to start the journey:  A. Whole Foods, Plant-based plate comprising of fruits and vegetables, plant-based proteins, whole-grain carbohydrates was discussed in detail with the patient.   A list for source of those nutrients were also provided to the patient.  Patient will use only water or unsweetened tea for hydration. B.  The need to stay away from risky substances including alcohol, smoking; obtaining 7 to 9 hours of restorative sleep, at least 150 minutes of moderate intensity exercise weekly, the importance of healthy social connections,  and stress reduction techniques were discussed. C.  A full color page of  Calorie density of various food groups per pound showing examples of each food groups was provided to the patient.  - Nutritional counseling repeated at each appointment due to patients tendency to  fall back in to old habits.  - The patient admits there is a room for improvement in their diet and drink choices. -  Suggestion is made for the patient to avoid simple carbohydrates from their diet including Cakes, Sweet Desserts / Pastries, Ice Cream, Soda (diet and regular), Sweet Tea, Candies, Chips, Cookies, Sweet Pastries, Store Bought Juices, Alcohol in Excess of 1-2 drinks a day, Artificial Sweeteners, Coffee Creamer,  and "Sugar-free" Products. This will help patient to have stable blood glucose profile and potentially avoid unintended weight gain.   - I encouraged the patient to switch to unprocessed or minimally processed complex starch and increased protein intake (animal or plant source), fruits, and vegetables.   - Patient is advised to stick to a routine mealtimes to eat 3 meals a day and avoid unnecessary snacks (to snack only to correct hypoglycemia).  - I have approached him with the following individualized plan to manage his diabetes and patient agrees:   -He is advised to increase his Tresiba to 25 units SQ nightly and continue Novolog to 5-11 units TID with meals if glucose is above 90 and he is eating (Specific instructions on how to titrate insulin dosage based on glucose readings given to patient in writing)- but to be more consistent with it.    -he is encouraged to continue monitoring glucose 4 times daily (using his CGM), before meals and before bed, and to call the clinic if he has readings less than 70 or above 300 for 3 tests in a row.  I did try to upgrade his CGM to [place] 3 since the 2 product will be discontinued in September.  Wife said it will be costing over $200 for a 3 month supply.  I encouraged her to reach out to insurance to find out if they prefer us  to send to a different supplier.  - he is warned not to take insulin without proper monitoring per orders. - Adjustment parameters are given to him for hypo and hyperglycemia in writing.  - his Glipizide was be discontinued, risk outweighs benefit for this patient- risk of hypoglycemia given advanced age and CKD. - he is not a candidate for full dose Metformin due to concurrent renal insufficiency, I discontinued this on previously to de-escalate his treatment and preserve kidney function.  - he is not an ideal candidate for incretin therapy due to body habitus with BMI at 25.  -I would not consider him to be a great candidate  for SGLT2i despite his CHF history as it may increase his risk of falls (due to polyuria), UTI, and yeast formation.  - Specific targets for  A1c; LDL, HDL, and Triglycerides were discussed with the patient.  2) Blood Pressure /Hypertension:  his blood pressure is controlled to target for his age.   he is advised to continue his current medications including Norvasc 5 mg p.o. daily with breakfast, Lasix 20 mg po daily as needed, Lisinopril 20 mg po daily and Metoprolol 50 mg po daily.  3) Lipids/Hyperlipidemia:    Review of his recent lipid panel from 05/10/22 showed controlled LDL at 65.  he is advised to continue Lipitor 80 mg daily at bedtime and Zetia 10 mg po daily.  Side effects and precautions discussed with him.    4)  Weight/Diet:  his Body mass index is 27.55 kg/m.  -  he is NOT a candidate for weight loss.  Exercise, and detailed carbohydrates information provided  -  detailed on discharge instructions.  5) Hypothyroidism-unspecified The details surrounding his diagnosis are not available.    His previsit TFTs are consistent with appropriate hormone replacement.  He is advised to continue his Levothyroxine 88 mcg po daily before breakfast.     - The correct intake of thyroid hormone (Levothyroxine, Synthroid), is on empty stomach first thing in the morning, with water, separated by at least 30 minutes from breakfast and other medications,  and separated by more than 4 hours from calcium, iron, multivitamins, acid reflux medications (PPIs).  - This medication is a life-long medication and will be needed to correct thyroid hormone imbalances for the rest of your life.  The dose may change from time to time, based on thyroid blood work.  - It is extremely important to be consistent taking this medication, near the same time each morning.  -AVOID TAKING PRODUCTS CONTAINING BIOTIN (commonly found in Hair, Skin, Nails vitamins) AS IT INTERFERES WITH THE VALIDITY OF THYROID FUNCTION BLOOD  TESTS.  6) Vitamin D deficiency His most recent vitamin D level from 05/10/22 was slightly low at 28.8.  He is already taking supplement but not sure of the amount.  I advised to take Vitamin d3 5000 units daily.  7) Chronic Care/Health Maintenance: -he is on ACEI/ARB and Statin medications and is encouraged to initiate and continue to follow up with Ophthalmology, Dentist, Podiatrist at least yearly or according to recommendations, and advised to stay away from smoking. I have recommended yearly flu vaccine and pneumonia vaccine at least every 5 years; moderate intensity exercise for up to 150 minutes weekly; and sleep for at least 7 hours a day.  - he is advised to maintain close follow up with Kermit Ped, Heath Litten, MD for primary care needs, as well as his other providers for optimal and coordinated care.     I spent  28  minutes in the care of the patient today including review of labs from CMP, Lipids, Thyroid Function, Hematology (current and previous including abstractions from other facilities); face-to-face time discussing  his blood glucose readings/logs, discussing hypoglycemia and hyperglycemia episodes and symptoms, medications doses, his options of short and long term treatment based on the latest standards of care / guidelines;  discussion about incorporating lifestyle medicine;  and documenting the encounter. Risk reduction counseling performed per USPSTF guidelines to reduce obesity and cardiovascular risk factors.     Please refer to Patient Instructions for Blood Glucose Monitoring and Insulin/Medications Dosing Guide"  in media tab for additional information. Please  also refer to " Patient Self Inventory" in the Media  tab for reviewed elements of pertinent patient history.  Samuel Crock [place]. participated in the discussions, expressed understanding, and voiced agreement with the above plans.  All questions were answered to his satisfaction. he is encouraged to contact clinic  should he have any questions or concerns prior to his return visit.     Follow up plan: - Return in about 3 months (around 07/26/2023) for Diabetes F/U with A1c in office, No previsit labs, Bring meter and logs.  Hulon Magic, [facility] [address] [place], [place] [address] Phone: [phone] Fax: [phone]  04/26/2023, 11:45 AM

## 2023-04-26 NOTE — Progress Notes (Signed)
  Subjective:  Patient ID: Dylan Robles., male    DOB: 1940/07/30,  MRN: [id number]  Samuel Crock [place]. presents to clinic today for at risk footcare. Patient has h/o diabetes, neuropathy and PAD and is seen for  and painful, elongated thickened toenails x 10 which are symptomatic when wearing enclosed shoe gear. This interferes with his/her daily activities.  Chief Complaint  Patient presents with   Nail Problem    Pt is here for [facility] unsure of last A1C PCP is Dr Kermit Ped and LOV was in March.   New problem(s): None.   PCP is Tobi Fortes, MD.  Allergies  Allergen Reactions   Plavix [Clopidogrel] Hives    Review of Systems: Negative except as noted in the HPI.  Objective: No changes noted in today's physical examination. There were no vitals filed for this visit. Dylan Robles. is a pleasant 83 y.o. male WD, WN in NAD. AAO x 3.  Vascular Examination: CFT <4 seconds b/l. DP pulses diminished b/l. PT pulses diminished b/l. Digital hair absent. Skin temperature gradient warm to cool b/l. No ischemia or gangrene. No cyanosis or clubbing noted b/l. Trace edema noted left foot. +1 pitting edema right foot.   Neurological Examination: Protective sensation diminished with 10g monofilament b/l.  Dermatological Examination: Pedal skin thin, shiny and atrophic b/l. No open wounds. No interdigital macerations.   Toenails 1-5 b/l thick, discolored, elongated with subungual debris and pain on dorsal palpation.   No corns, calluses nor porokeratotic lesions noted.  Musculoskeletal Examination: Muscle strength 5/5 to all lower extremity muscle groups bilaterally. Hammertoe(s) noted to the L 2nd toe, L 3rd toe, L 4th toe, R 2nd toe, R 3rd toe, and R 4th toe. Utilizes walker for ambulation assistance.  Radiographs: None  Assessment/Plan: 1. Pain due to onychomycosis of toenails of both feet   2. PAD (peripheral artery disease) (HCC)   3. Diabetic polyneuropathy  associated with type 2 diabetes mellitus [facility])   Consent given for treatment. Patient examined. All patient's and/or POA's questions/concerns addressed on today's visit. Toenails 1-5 debrided in length and girth without incident. Continue foot and shoe inspections daily. Monitor blood glucose per PCP/Endocrinologist's recommendations. Continue soft, supportive shoe gear daily. Report any pedal injuries to medical professional. Call office if there are any questions/concerns. -Patient/POA to call should there be question/concern in the interim.   Return in about 3 months (around 07/23/2023).  Luella Sager, DPM      [place] LOCATION: [address] [place] [address]                   Office [phone]   [facility] LOCATION: [address] [place], [place] [address] Office [phone]

## 2023-04-27 ENCOUNTER — Other Ambulatory Visit (HOSPITAL_COMMUNITY): Payer: Self-pay

## 2023-04-27 ENCOUNTER — Other Ambulatory Visit: Payer: Self-pay | Admitting: Nurse Practitioner

## 2023-04-27 DIAGNOSIS — Z794 Long term (current) use of insulin: Secondary | ICD-10-CM

## 2023-04-27 DIAGNOSIS — E1122 Type 2 diabetes mellitus with diabetic chronic kidney disease: Secondary | ICD-10-CM

## 2023-04-27 DIAGNOSIS — E1142 Type 2 diabetes mellitus with diabetic polyneuropathy: Secondary | ICD-10-CM

## 2023-04-27 DIAGNOSIS — N1832 Chronic kidney disease, stage 3b: Secondary | ICD-10-CM

## 2023-05-03 ENCOUNTER — Other Ambulatory Visit: Payer: PPO

## 2023-05-03 ENCOUNTER — Other Ambulatory Visit: Payer: Self-pay

## 2023-05-03 DIAGNOSIS — C61 Malignant neoplasm of prostate: Secondary | ICD-10-CM | POA: Diagnosis not present

## 2023-05-04 LAB — PSA, TOTAL AND FREE
PSA, Free Pct: 50 %
PSA, Free: 0.05 ng/mL
Prostate Specific Ag, Serum: 0.1 ng/mL (ref 0.0–4.0)

## 2023-05-07 ENCOUNTER — Ambulatory Visit: Payer: PPO | Admitting: Urology

## 2023-05-07 VITALS — BP 129/69 | HR 74

## 2023-05-07 DIAGNOSIS — R338 Other retention of urine: Secondary | ICD-10-CM

## 2023-05-07 DIAGNOSIS — N401 Enlarged prostate with lower urinary tract symptoms: Secondary | ICD-10-CM | POA: Diagnosis not present

## 2023-05-07 DIAGNOSIS — C61 Malignant neoplasm of prostate: Secondary | ICD-10-CM

## 2023-05-07 NOTE — Progress Notes (Signed)
 05/07/2023 2:54 PM   Dylan Robles Nov 15, 1940 [id number]  Referring provider: Tobi Fortes, MD [address] [place],  [place] [address]  Followup after TURP  HPI: Dylan Robles is a 83yo here for followup for BPH and prostate cancer. IPSS 0 QOL 0 after TURP. Urine stream strong. No straining to urinate. Nocturia 0x. PSA 0.1   PMH: Past Medical History:  Diagnosis Date   Acute lower UTI 09/20/2017   Bradycardia on ECG 07/31/2011   Coronary artery disease    hyperlipidemia   Decreased GFR 09/23/2019   Dehydration 09/21/2017   Diabetes mellitus    Diabetes mellitus without complication (HCC)    Hypercholesterolemia    Hypertension    Hypothyroidism    Left arm pain 01/29/2020   Paronychia of great toe of left foot 09/26/2018   Paronychia of great toe, left 09/26/2018   PERIPHERAL NEUROPATHY 01/13/2006   Qualifier: Diagnosis of  By: Seabron Cypress MD, Christine     Shortness of breath dyspnea    with exertion   Sleep apnea    Stroke (HCC) 08/10/2022   Vasovagal syncope    Weakness 09/20/2017    Surgical History: Past Surgical History:  Procedure Laterality Date   APPENDECTOMY     CARDIAC CATHETERIZATION N/A 06/16/2014   Procedure: Left Heart Cath;  Surgeon: Cherrie Cornwall, MD;  Location: [facility];  Service: Cardiovascular;  Laterality: N/A;   CIRCUMCISION, NON-NEWBORN     CYSTOSCOPY N/A 02/01/2023   Procedure: CYSTOSCOPY;  Surgeon: Marco Severs, MD;  Location: [facility];  Service: Urology;  Laterality: N/A;   HERNIA REPAIR     LOWER EXTREMITY ANGIOGRAPHY Left 12/24/2018   Procedure: LOWER EXTREMITY ANGIOGRAPHY;  Surgeon: Jackquelyn Mass, MD;  Location: [facility];  Service: Cardiovascular;  Laterality: Left;   THYROID  SURGERY     TRANSURETHRAL RESECTION OF PROSTATE N/A 02/01/2023   Procedure: TRANSURETHRAL RESECTION OF THE PROSTATE (TURP);  Surgeon: Marco Severs, MD;  Location: [facility];  Service: Urology;  Laterality: N/A;     Home Medications:  Allergies as of 05/07/2023       Reactions   Plavix [clopidogrel] Hives        Medication List        Accurate as of May 07, 2023  2:54 PM. If you have any questions, ask your nurse or doctor.          STOP taking these medications    tamsulosin  0.4 MG Caps capsule Commonly known as: FLOMAX        TAKE these medications    atorvastatin  80 MG tablet Commonly known as: LIPITOR Take 1 tablet (80 mg total) by mouth daily.   cetirizine  5 MG tablet Commonly known as: ZYRTEC  Take 1 tablet (5 mg total) by mouth daily.   Eliquis  2.5 MG Tabs tablet Generic drug: apixaban  Take 1 tablet (2.5 mg total) by mouth 2 (two) times daily.   ezetimibe  10 MG tablet Commonly known as: ZETIA  Take 1 tablet (10 mg total) by mouth daily.   Fish Oil-Vitamin D  1000-1000 MG-UNIT Caps Take 1 capsule by mouth daily.   FreeStyle Libre 3 Plus Sensor Misc Change sensor every 15 days to monitor blood glucose readings as directed by provider.   FreeStyle [place] 3 Reader [facility] Patient is to use to receive his blood glucose readings daily as directed by provider.   furosemide 20 MG tablet Commonly known as: LASIX Take 20 mg by mouth daily  as needed for edema or fluid.   gabapentin  300 MG capsule Commonly known as: NEURONTIN  Take 1 capsule (300 mg total) by mouth 3 (three) times daily.   isosorbide  mononitrate 60 MG 24 hr tablet Commonly known as: IMDUR  Take 1 tablet (60 mg total) by mouth daily.   levothyroxine  88 MCG tablet Commonly known as: SYNTHROID  Take 1 tablet (88 mcg total) by mouth daily before breakfast.   metoprolol  succinate 25 MG 24 hr tablet Commonly known as: TOPROL -XL Take 1 tablet (25 mg total) by mouth daily.   NovoLOG  FlexPen 100 UNIT/ML FlexPen Generic drug: insulin  aspart Inject 10-16 Units into the skin 3 (three) times daily with meals.   spironolactone  25 MG tablet Commonly known as: Aldactone  Take 1 tablet (25 mg total) by mouth  daily.   Tresiba  FlexTouch 100 UNIT/ML FlexTouch Pen Generic drug: insulin  degludec Inject 25 Units into the skin at bedtime.   triamcinolone  cream 0.1 % Commonly known as: KENALOG  apply cream externally to affected area twice dailiy.        Allergies:  Allergies  Allergen Reactions   Plavix [Clopidogrel] Hives    Family History: Family History  Problem Relation Age of Onset   Diabetes Father    Hypertension Father    Cancer Mother    Diabetes Brother     Social History:  reports that he has quit smoking. He has never used smokeless tobacco. He reports that he does not drink alcohol and does not use drugs.  ROS: All other review of systems were reviewed and are negative except what is noted above in HPI  Physical Exam: BP 129/69   Pulse 74   Constitutional:  Alert and oriented, No acute distress. HEENT: [place] AT, moist mucus membranes.  Trachea midline, no masses. Cardiovascular: No clubbing, cyanosis, or edema. Respiratory: Normal respiratory effort, no increased work of breathing. GI: Abdomen is soft, nontender, nondistended, no abdominal masses GU: No CVA tenderness.  Lymph: No cervical or inguinal lymphadenopathy. Skin: No rashes, bruises or suspicious lesions. Neurologic: Grossly intact, no focal deficits, moving all 4 extremities. Psychiatric: Normal mood and affect.  Laboratory Data: Lab Results  Component Value Date   WBC 16.6 (H) 02/02/2023   HGB 10.3 (L) 02/02/2023   HCT 32.2 (L) 02/02/2023   MCV 92.3 02/02/2023   PLT 152 02/02/2023    Lab Results  Component Value Date   CREATININE 1.92 (H) 04/20/2023    No results found for: "PSA"  No results found for: "TESTOSTERONE"  Lab Results  Component Value Date   HGBA1C 11.3 (A) 04/26/2023    Urinalysis    Component Value Date/Time   COLORURINE YELLOW (A) 08/21/2022 0450   APPEARANCEUR Clear 10/27/2022 1009   LABSPEC 1.029 08/21/2022 0450   PHURINE 5.0 08/21/2022 0450   GLUCOSEU Trace (A)  10/27/2022 1009   HGBUR NEGATIVE 08/21/2022 0450   BILIRUBINUR Negative 10/27/2022 1009   KETONESUR NEGATIVE 08/21/2022 0450   PROTEINUR Negative 10/27/2022 1009   PROTEINUR NEGATIVE 08/21/2022 0450   UROBILINOGEN 0.2 07/28/2011 1351   NITRITE Negative 10/27/2022 1009   NITRITE NEGATIVE 08/21/2022 0450   LEUKOCYTESUR Negative 10/27/2022 1009   LEUKOCYTESUR NEGATIVE 08/21/2022 0450    Lab Results  Component Value Date   LABMICR Comment 10/27/2022   BACTERIA NONE SEEN 08/21/2022    Pertinent Imaging:  No results found for this or any previous visit.  No results found for this or any previous visit.  No results found for this or any previous visit.  No results found for this or any previous visit.  Results for orders placed during the hospital encounter of 05/17/22  US  RENAL  Narrative CLINICAL DATA:  Chronic renal disease  EXAM: RENAL / URINARY TRACT ULTRASOUND COMPLETE  COMPARISON:  None Available.  FINDINGS: Right Kidney:  Renal measurements: 10 x 5.1 x 4.7 cm = volume: 124 mL. Echogenicity within normal limits. No mass or hydronephrosis visualized.  Left Kidney:  Renal measurements: 11.7 x 5.5 x 4.6 cm = volume: 155 mL. Contains 2 cysts with the largest measuring 2.6 cm. No follow-up imaging recommended for the cysts.  Bladder:  Mild trabeculation of the bladder wall. A contour abnormality at the base of the bladder measuring 2.3 cm is consistent with mass effect on the bladder base by the prostate. The same appearance was identified on the May 12, 2021 CT scan, coronal imaging. Large 268 cc postvoid residual.  Other:  None.  IMPRESSION: 1. The kidneys are unremarkable. 2. Mild trabeculation of the bladder wall. 3. Contour abnormality at the base of the bladder is consistent with mass effect on the bladder base by the prostate. 4. Large 268 cc postvoid residual.   Electronically Signed By: Lorrene Rosser III M.D. On: 05/19/2022 19:01  No  results found for this or any previous visit.  No results found for this or any previous visit.  Results for orders placed during the hospital encounter of 09/12/17  CT Renal Stone Study  Narrative CLINICAL DATA:  Dysuria  EXAM: CT ABDOMEN AND PELVIS WITHOUT CONTRAST  TECHNIQUE: Multidetector CT imaging of the abdomen and pelvis was performed following the standard protocol without IV contrast.  COMPARISON:  None.  FINDINGS: Lower chest: Lung bases demonstrate no acute consolidation or effusion. The heart size is within normal limits. Coronary vascular calcification  Hepatobiliary: No focal hepatic abnormality or biliary dilatation. Small calcifications in the right upper quadrant may reflect small stones at the gallbladder neck. No gallbladder wall inflammation.  Pancreas: No inflammatory change. Diffusely atrophic. Possible 9 mm low-density cystic lesion at the uncinate process.  Spleen: Normal in size without focal abnormality.  Adrenals/Urinary Tract: Adrenal glands are within normal limits. Moderate perinephric fat stranding. Mild bilateral hydronephrosis and hydroureter. Probable cysts within the left kidney. No ureteral stone. Bladder distention.  Stomach/Bowel: The stomach is nonenlarged. No dilated small bowel. No colon wall thickening.  Vascular/Lymphatic: Moderate to marked aortic atherosclerosis. No aneurysm. No significantly enlarged lymph nodes.  Reproductive: Enlarged prostate gland with calcification. Mass effect on the posterior bladder  Other: No free air or free fluid. Fat within the left inguinal canal.  Musculoskeletal: Degenerative changes. No fracture or malalignment. 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 This measures about 6.6 cm transverse.  IMPRESSION: 1. Moderate perinephric edema bilaterally with mild hydronephrosis and hydroureter but no ureteral stone. Moderate to marked bladder distension,  question outlet obstruction. There is slight enlargement of the prostate gland. 2. Possible small stones at the neck of the gallbladder 3. Possible 9 mm low-density cystic lesion at the uncinate process of the pancreas. When the patient is clinically stable and able to follow directions and hold their breath (preferably as an outpatient) further evaluation with dedicated abdominal MRI should be considered.   Electronically Signed By: Esmeralda Hedge M.D. On: 09/12/2017 15:14   Assessment & Plan:    1. Malignant neoplasm of prostate (HCC) (Primary) 6 months with a PSA  - Urinalysis, Routine w reflex microscopic  2. Benign prostatic hyperplasia  with incomplete bladder emptying -improved after TURP   No follow-ups on file.  Johnie Nailer, MD  [facility] [place]

## 2023-05-08 LAB — URINALYSIS, ROUTINE W REFLEX MICROSCOPIC
Bilirubin, UA: NEGATIVE
Glucose, UA: NEGATIVE
Ketones, UA: NEGATIVE
Nitrite, UA: NEGATIVE
Protein,UA: NEGATIVE
Specific Gravity, UA: 1.015 (ref 1.005–1.030)
Urobilinogen, Ur: 1 mg/dL (ref 0.2–1.0)
pH, UA: 6 (ref 5.0–7.5)

## 2023-05-08 LAB — MICROSCOPIC EXAMINATION: Bacteria, UA: NONE SEEN

## 2023-05-09 ENCOUNTER — Other Ambulatory Visit: Payer: Self-pay | Admitting: Cardiovascular Disease

## 2023-05-10 ENCOUNTER — Encounter: Payer: Self-pay | Admitting: Urology

## 2023-05-10 ENCOUNTER — Other Ambulatory Visit (HOSPITAL_COMMUNITY): Payer: Self-pay

## 2023-05-10 MED ORDER — APIXABAN 2.5 MG PO TABS
2.5000 mg | ORAL_TABLET | Freq: Two times a day (BID) | ORAL | 3 refills | Status: DC
  Filled 2023-05-10: qty 60, 30d supply, fill #0
  Filled 2023-06-04: qty 60, 30d supply, fill #1
  Filled 2023-07-05: qty 60, 30d supply, fill #2
  Filled 2023-08-03: qty 60, 30d supply, fill #3

## 2023-05-10 NOTE — Patient Instructions (Signed)

## 2023-05-17 ENCOUNTER — Other Ambulatory Visit (HOSPITAL_COMMUNITY): Payer: Self-pay

## 2023-05-17 MED FILL — Atorvastatin Calcium Tab 80 MG (Base Equivalent): ORAL | 90 days supply | Qty: 90 | Fill #1 | Status: AC

## 2023-05-17 MED FILL — Ezetimibe Tab 10 MG: ORAL | 90 days supply | Qty: 90 | Fill #1 | Status: AC

## 2023-05-17 MED FILL — Cetirizine HCl Tab 5 MG: ORAL | 90 days supply | Qty: 90 | Fill #1 | Status: AC

## 2023-05-21 ENCOUNTER — Other Ambulatory Visit: Payer: Self-pay | Admitting: Cardiovascular Disease

## 2023-05-21 ENCOUNTER — Other Ambulatory Visit: Payer: Self-pay

## 2023-05-21 ENCOUNTER — Other Ambulatory Visit (HOSPITAL_COMMUNITY): Payer: Self-pay

## 2023-05-21 DIAGNOSIS — R0602 Shortness of breath: Secondary | ICD-10-CM

## 2023-05-21 MED ORDER — SPIRONOLACTONE 25 MG PO TABS
25.0000 mg | ORAL_TABLET | Freq: Every day | ORAL | 0 refills | Status: DC
  Filled 2023-05-21: qty 30, 30d supply, fill #0

## 2023-05-29 ENCOUNTER — Encounter (INDEPENDENT_AMBULATORY_CARE_PROVIDER_SITE_OTHER): Payer: Self-pay

## 2023-06-05 ENCOUNTER — Other Ambulatory Visit (HOSPITAL_COMMUNITY): Payer: Self-pay

## 2023-06-05 ENCOUNTER — Other Ambulatory Visit: Payer: Self-pay

## 2023-06-06 ENCOUNTER — Other Ambulatory Visit: Payer: Self-pay

## 2023-06-08 ENCOUNTER — Ambulatory Visit: Admitting: Internal Medicine

## 2023-06-08 ENCOUNTER — Encounter: Payer: Self-pay | Admitting: Internal Medicine

## 2023-06-08 ENCOUNTER — Other Ambulatory Visit (HOSPITAL_COMMUNITY): Payer: Self-pay

## 2023-06-08 VITALS — BP 118/66 | HR 80 | Ht 78.0 in | Wt 241.0 lb

## 2023-06-08 DIAGNOSIS — E1159 Type 2 diabetes mellitus with other circulatory complications: Secondary | ICD-10-CM

## 2023-06-08 DIAGNOSIS — N401 Enlarged prostate with lower urinary tract symptoms: Secondary | ICD-10-CM

## 2023-06-08 DIAGNOSIS — I10 Essential (primary) hypertension: Secondary | ICD-10-CM

## 2023-06-08 DIAGNOSIS — E039 Hypothyroidism, unspecified: Secondary | ICD-10-CM

## 2023-06-08 DIAGNOSIS — N1831 Chronic kidney disease, stage 3a: Secondary | ICD-10-CM | POA: Diagnosis not present

## 2023-06-08 DIAGNOSIS — I4891 Unspecified atrial fibrillation: Secondary | ICD-10-CM

## 2023-06-08 DIAGNOSIS — R3914 Feeling of incomplete bladder emptying: Secondary | ICD-10-CM

## 2023-06-08 DIAGNOSIS — Z794 Long term (current) use of insulin: Secondary | ICD-10-CM | POA: Diagnosis not present

## 2023-06-08 NOTE — Assessment & Plan Note (Signed)
 Remains adequately controlled on current antihypertensive regimen.

## 2023-06-08 NOTE — Assessment & Plan Note (Signed)
 He is currently prescribed levothyroxine  88 mcg daily.  TSH WNL on labs from last month.

## 2023-06-08 NOTE — Addendum Note (Signed)
 Addended by: Cloris Flippo E on: 06/08/2023 11:04 AM   Modules accepted: Orders

## 2023-06-08 NOTE — Assessment & Plan Note (Signed)
 Regular rate and rhythm detected on exam today.  Followed by cardiology, last seen 3/10.  He remains on Eliquis  and Toprol -XL.

## 2023-06-08 NOTE — Assessment & Plan Note (Signed)
 S/p TURP in January.  Recently seen by urology for follow-up.  Flomax  has been discontinued as urinary symptoms have resolved.

## 2023-06-08 NOTE — Progress Notes (Signed)
 Established Patient Office Visit  Subjective   Patient ID: Dylan Kassis., male    DOB: [birth year]  Age: 83 y.o. MRN: [id number]  Chief Complaint  Patient presents with   Care Management    Three month follow up   Mr. Stickler returns to care today for routine follow-up.  He was last evaluated by me on 3/6.  No medication changes were made at that time and 46-month follow-up was arranged.  In the interim, he has been seen by cardiology, vascular surgery, podiatry, endocrinology, and urology for follow-up.  There have otherwise been no acute interval events.  Today he reports feeling well and has no acute concerns to discuss.  Past Medical History:  Diagnosis Date   Acute lower UTI 09/20/2017   Bradycardia on ECG 07/31/2011   Coronary artery disease    hyperlipidemia   Decreased GFR 09/23/2019   Dehydration 09/21/2017   Diabetes mellitus    Diabetes mellitus without complication (HCC)    Hypercholesterolemia    Hypertension    Hypothyroidism    Left arm pain 01/29/2020   Paronychia of great toe of left foot 09/26/2018   Paronychia of great toe, left 09/26/2018   PERIPHERAL NEUROPATHY 01/13/2006   Qualifier: Diagnosis of  By: Seabron Cypress MD, Christine     Shortness of breath dyspnea    with exertion   Sleep apnea    Stroke (HCC) 08/10/2022   Vasovagal syncope    Weakness 09/20/2017   Past Surgical History:  Procedure Laterality Date   APPENDECTOMY     CARDIAC CATHETERIZATION N/A 06/16/2014   Procedure: Left Heart Cath;  Surgeon: Cherrie Cornwall, MD;  Location: [facility];  Service: Cardiovascular;  Laterality: N/A;   CIRCUMCISION, NON-NEWBORN     CYSTOSCOPY N/A 02/01/2023   Procedure: CYSTOSCOPY;  Surgeon: Marco Severs, MD;  Location: [facility];  Service: Urology;  Laterality: N/A;   HERNIA REPAIR     LOWER EXTREMITY ANGIOGRAPHY Left 12/24/2018   Procedure: LOWER EXTREMITY ANGIOGRAPHY;  Surgeon: Jackquelyn Mass, MD;  Location: [facility];  Service:  Cardiovascular;  Laterality: Left;   THYROID  SURGERY     TRANSURETHRAL RESECTION OF PROSTATE N/A 02/01/2023   Procedure: TRANSURETHRAL RESECTION OF THE PROSTATE (TURP);  Surgeon: Marco Severs, MD;  Location: [facility];  Service: Urology;  Laterality: N/A;   Social History   Tobacco Use   Smoking status: Former   Smokeless tobacco: Never  Advertising account planner   Vaping status: Never Used  Substance Use Topics   Alcohol use: No   Drug use: No   Family History  Problem Relation Age of Onset   Diabetes Father    Hypertension Father    Cancer Mother    Diabetes Brother    Allergies  Allergen Reactions   Plavix [Clopidogrel] Hives   Review of Systems  Constitutional:  Negative for chills and fever.  HENT:  Negative for sore throat.   Respiratory:  Negative for cough and shortness of breath.   Cardiovascular:  Negative for chest pain, palpitations and leg swelling.  Gastrointestinal:  Negative for abdominal pain, blood in stool, constipation, diarrhea, nausea and vomiting.  Genitourinary:  Negative for dysuria and hematuria.  Musculoskeletal:  Negative for myalgias.  Skin:  Negative for itching and rash.  Neurological:  Negative for dizziness and headaches.  Psychiatric/Behavioral:  Negative for depression and suicidal ideas.      Objective:     BP 118/66   Pulse 80  Ht 6\' 6"  (1.981 m)   Wt 241 lb (109.3 kg)   SpO2 97%   BMI 27.85 kg/m  BP Readings from Last 3 Encounters:  06/08/23 118/66  05/07/23 129/69  04/26/23 136/70   Physical Exam Vitals reviewed.  Constitutional:      General: He is not in acute distress.    Appearance: Normal appearance. He is not ill-appearing.  HENT:     Head: Normocephalic and atraumatic.     Right Ear: External ear normal.     Left Ear: External ear normal.     Nose: Nose normal. No congestion or rhinorrhea.     Mouth/Throat:     Mouth: Mucous membranes are moist.     Pharynx: Oropharynx is clear.  Eyes:     General: No scleral  icterus.    Extraocular Movements: Extraocular movements intact.     Conjunctiva/sclera: Conjunctivae normal.     Pupils: Pupils are equal, round, and reactive to light.  Cardiovascular:     Rate and Rhythm: Normal rate and regular rhythm.     Pulses: Normal pulses.     Heart sounds: Normal heart sounds. No murmur heard. Pulmonary:     Effort: Pulmonary effort is normal.     Breath sounds: Normal breath sounds. No wheezing, rhonchi or rales.  Abdominal:     General: Abdomen is flat. Bowel sounds are normal. There is no distension.     Palpations: Abdomen is soft.     Tenderness: There is no abdominal tenderness.  Musculoskeletal:        General: No swelling or deformity. Normal range of motion.     Cervical back: Normal range of motion.  Skin:    General: Skin is warm and dry.     Capillary Refill: Capillary refill takes less than 2 seconds.  Neurological:     General: No focal deficit present.     Mental Status: He is alert and oriented to person, place, and time.     Motor: No weakness.     Gait: Gait abnormal (ambulates with a walker).  Psychiatric:        Mood and Affect: Mood normal.        Behavior: Behavior normal.        Thought Content: Thought content normal.   Last CBC Lab Results  Component Value Date   WBC 16.6 (H) 02/02/2023   HGB 10.3 (L) 02/02/2023   HCT 32.2 (L) 02/02/2023   MCV 92.3 02/02/2023   MCH 29.5 02/02/2023   RDW 15.2 02/02/2023   PLT 152 02/02/2023   Last metabolic panel Lab Results  Component Value Date   GLUCOSE 173 (H) 04/20/2023   NA 139 04/20/2023   K 4.3 04/20/2023   CL 101 04/20/2023   CO2 22 04/20/2023   BUN 27 04/20/2023   CREATININE 1.92 (H) 04/20/2023   EGFR 34 (L) 04/20/2023   CALCIUM  10.1 04/20/2023   PROT 6.4 04/20/2023   ALBUMIN 4.1 04/20/2023   LABGLOB 2.3 04/20/2023   AGRATIO 1.9 05/10/2022   BILITOT 0.8 04/20/2023   ALKPHOS 163 (H) 04/20/2023   AST 20 04/20/2023   ALT 26 04/20/2023   ANIONGAP 8 02/02/2023    Last lipids Lab Results  Component Value Date   CHOL 126 08/21/2022   HDL 34 (L) 08/21/2022   LDLCALC 77 08/21/2022   TRIG 74 08/21/2022   CHOLHDL 3.7 08/21/2022   Last hemoglobin A1c Lab Results  Component Value Date   HGBA1C 11.3 (A) 04/26/2023  Last thyroid  functions Lab Results  Component Value Date   TSH 2.680 04/20/2023   Last vitamin D  Lab Results  Component Value Date   VD25OH 28.8 (L) 05/10/2022   Last vitamin B12 and Folate Lab Results  Component Value Date   VITAMINB12 490 11/10/2021   FOLATE 14.3 11/10/2021     Assessment & Plan:   Problem List Items Addressed This Visit       Essential hypertension   Remains adequately controlled on current antihypertensive regimen.      Type 2 diabetes mellitus with vascular disease (HCC)   A1c 11.3 on labs from April.  Tresiba  was increased.  He is additionally prescribed NovoLog  sliding scale regimen.  He was encouraged to make significant dietary changes in an effort to lower his blood sugar. Will complete previously ordered urine microalbumin/creatinine ratio today.      New onset atrial fibrillation (HCC) - Primary   Regular rate and rhythm detected on exam today.  Followed by cardiology, last seen 3/10.  He remains on Eliquis  and Toprol -XL.      Hypothyroidism   He is currently prescribed levothyroxine  88 mcg daily.  TSH WNL on labs from last month.      Stage 3a chronic kidney disease (HCC)   Followed by nephrology.  Renal function stable on latest labs.  Nephrology follow-up is scheduled for next month.      BPH (benign prostatic hyperplasia)   S/p TURP in January.  Recently seen by urology for follow-up.  Flomax  has been discontinued as urinary symptoms have resolved.      Return in about 3 months (around 09/08/2023).   Tobi Fortes, MD

## 2023-06-08 NOTE — Assessment & Plan Note (Signed)
 A1c 11.3 on labs from April.  Tresiba  was increased.  He is additionally prescribed NovoLog  sliding scale regimen.  He was encouraged to make significant dietary changes in an effort to lower his blood sugar. Will complete previously ordered urine microalbumin/creatinine ratio today.

## 2023-06-08 NOTE — Assessment & Plan Note (Addendum)
 Followed by nephrology.  Renal function stable on latest labs.  Nephrology follow-up is scheduled for next month.

## 2023-06-08 NOTE — Patient Instructions (Signed)
 It was a pleasure to see you today.  Thank you for giving [place] the opportunity to be involved in your care.  Below is a brief recap of your visit and next steps.  We will plan to see you again in 3 months.  Summary No medication changes today Follow up in 3 months

## 2023-06-10 LAB — MICROALBUMIN / CREATININE URINE RATIO
Creatinine, Urine: 134.5 mg/dL
Microalb/Creat Ratio: 6 mg/g{creat} (ref 0–29)
Microalbumin, Urine: 8.7 ug/mL

## 2023-06-11 ENCOUNTER — Ambulatory Visit: Payer: Self-pay | Admitting: Internal Medicine

## 2023-06-12 DIAGNOSIS — R809 Proteinuria, unspecified: Secondary | ICD-10-CM | POA: Diagnosis not present

## 2023-06-12 DIAGNOSIS — D631 Anemia in chronic kidney disease: Secondary | ICD-10-CM | POA: Diagnosis not present

## 2023-06-12 DIAGNOSIS — N189 Chronic kidney disease, unspecified: Secondary | ICD-10-CM | POA: Diagnosis not present

## 2023-06-12 DIAGNOSIS — E211 Secondary hyperparathyroidism, not elsewhere classified: Secondary | ICD-10-CM | POA: Diagnosis not present

## 2023-06-13 DIAGNOSIS — I639 Cerebral infarction, unspecified: Secondary | ICD-10-CM | POA: Diagnosis not present

## 2023-06-13 DIAGNOSIS — G939 Disorder of brain, unspecified: Secondary | ICD-10-CM | POA: Diagnosis not present

## 2023-06-13 DIAGNOSIS — I6521 Occlusion and stenosis of right carotid artery: Secondary | ICD-10-CM | POA: Diagnosis not present

## 2023-06-13 DIAGNOSIS — I6502 Occlusion and stenosis of left vertebral artery: Secondary | ICD-10-CM | POA: Diagnosis not present

## 2023-06-15 ENCOUNTER — Ambulatory Visit: Admitting: Internal Medicine

## 2023-06-18 ENCOUNTER — Other Ambulatory Visit: Payer: Self-pay | Admitting: Cardiology

## 2023-06-18 DIAGNOSIS — R0602 Shortness of breath: Secondary | ICD-10-CM

## 2023-06-19 ENCOUNTER — Ambulatory Visit: Admitting: Cardiovascular Disease

## 2023-06-19 ENCOUNTER — Encounter: Payer: Self-pay | Admitting: Cardiovascular Disease

## 2023-06-19 VITALS — BP 100/58 | HR 82 | Ht 78.0 in | Wt 239.0 lb

## 2023-06-19 DIAGNOSIS — I5032 Chronic diastolic (congestive) heart failure: Secondary | ICD-10-CM

## 2023-06-19 DIAGNOSIS — I25118 Atherosclerotic heart disease of native coronary artery with other forms of angina pectoris: Secondary | ICD-10-CM | POA: Diagnosis not present

## 2023-06-19 DIAGNOSIS — Z8679 Personal history of other diseases of the circulatory system: Secondary | ICD-10-CM

## 2023-06-19 DIAGNOSIS — R0602 Shortness of breath: Secondary | ICD-10-CM | POA: Diagnosis not present

## 2023-06-19 DIAGNOSIS — E782 Mixed hyperlipidemia: Secondary | ICD-10-CM | POA: Diagnosis not present

## 2023-06-19 DIAGNOSIS — Z0130 Encounter for examination of blood pressure without abnormal findings: Secondary | ICD-10-CM

## 2023-06-19 MED ORDER — FUROSEMIDE 40 MG PO TABS: 40.0000 mg | ORAL_TABLET | Freq: Every day | ORAL | 11 refills | Status: AC

## 2023-06-19 NOTE — Progress Notes (Signed)
 Cardiology Office Note   Date:  06/19/2023   ID:  Dylan Crock Jarmarcus Wambold., DOB 11/07/1940, MRN [id number]  PCP:  Alison Irvine, FNP  Cardiologist:  Debborah Fairly, MD      History of Present Illness: Dylan Leib. is a 83 y.o. male who presents for  Chief Complaint  Patient presents with   Follow-up    3 Months Follow Up    DOE, no SOB at rest.      Past Medical History:  Diagnosis Date   Acute lower UTI 09/20/2017   Bradycardia on ECG 07/31/2011   Coronary artery disease    hyperlipidemia   Decreased GFR 09/23/2019   Dehydration 09/21/2017   Diabetes mellitus    Diabetes mellitus without complication (HCC)    Hypercholesterolemia    Hypertension    Hypothyroidism    Left arm pain 01/29/2020   Paronychia of great toe of left foot 09/26/2018   Paronychia of great toe, left 09/26/2018   PERIPHERAL NEUROPATHY 01/13/2006   Qualifier: Diagnosis of  By: Seabron Cypress MD, Christine     Shortness of breath dyspnea    with exertion   Sleep apnea    Stroke (HCC) 08/10/2022   Vasovagal syncope    Weakness 09/20/2017     Past Surgical History:  Procedure Laterality Date   APPENDECTOMY     CARDIAC CATHETERIZATION N/A 06/16/2014   Procedure: Left Heart Cath;  Surgeon: Cherrie Cornwall, MD;  Location: [facility];  Service: Cardiovascular;  Laterality: N/A;   CIRCUMCISION, NON-NEWBORN     CYSTOSCOPY N/A 02/01/2023   Procedure: CYSTOSCOPY;  Surgeon: Marco Severs, MD;  Location: [facility];  Service: Urology;  Laterality: N/A;   HERNIA REPAIR     LOWER EXTREMITY ANGIOGRAPHY Left 12/24/2018   Procedure: LOWER EXTREMITY ANGIOGRAPHY;  Surgeon: Jackquelyn Mass, MD;  Location: [facility];  Service: Cardiovascular;  Laterality: Left;   THYROID  SURGERY     TRANSURETHRAL RESECTION OF PROSTATE N/A 02/01/2023   Procedure: TRANSURETHRAL RESECTION OF THE PROSTATE (TURP);  Surgeon: Marco Severs, MD;  Location: [facility];  Service: Urology;  Laterality:  N/A;     Current Outpatient Medications  Medication Sig Dispense Refill   furosemide (LASIX) 40 MG tablet Take 1 tablet (40 mg total) by mouth daily. 30 tablet 11   apixaban  (ELIQUIS ) 2.5 MG TABS tablet Take 1 tablet (2.5 mg total) by mouth 2 (two) times daily. 60 tablet 3   atorvastatin  (LIPITOR) 80 MG tablet Take 1 tablet (80 mg total) by mouth daily. 90 tablet 3   cetirizine  (ZYRTEC ) 5 MG tablet Take 1 tablet (5 mg total) by mouth daily. 90 tablet 3   Continuous Glucose Receiver (FREESTYLE LIBRE 3 READER) DEVI Patient is to use to receive his blood glucose readings daily as directed by provider. 1 each 0   Continuous Glucose Sensor (FREESTYLE LIBRE 3 PLUS SENSOR) MISC Change sensor every 15 days to monitor blood glucose readings as directed by provider. 6 each 3   ezetimibe  (ZETIA ) 10 MG tablet Take 1 tablet (10 mg total) by mouth daily. 90 tablet 3   Fish Oil-Vitamin D  1000-1000 MG-UNIT CAPS Take 1 capsule by mouth daily.     gabapentin  (NEURONTIN ) 300 MG capsule Take 1 capsule (300 mg total) by mouth 3 (three) times daily. 270 capsule 1   insulin  aspart (NOVOLOG ) 100 UNIT/ML FlexPen Inject 10-16 Units into the skin 3 (three) times daily with meals. 30 mL 3  insulin  degludec (TRESIBA  FLEXTOUCH) 100 UNIT/ML FlexTouch Pen Inject 25 Units into the skin at bedtime. 25 mL 3   isosorbide  mononitrate (IMDUR ) 60 MG 24 hr tablet Take 1 tablet (60 mg total) by mouth daily. 90 tablet 1   levothyroxine  (SYNTHROID ) 88 MCG tablet Take 1 tablet (88 mcg total) by mouth daily before breakfast. 90 tablet 3   metoprolol  succinate (TOPROL -XL) 25 MG 24 hr tablet Take 1 tablet (25 mg total) by mouth daily. 30 tablet 11   triamcinolone  cream (KENALOG ) 0.1 % apply cream externally to affected area twice dailiy. 15 g 1   No current facility-administered medications for this visit.    Allergies:   Plavix [clopidogrel]    Social History:   reports that he has quit smoking. He has never used smokeless tobacco.  He reports that he does not drink alcohol and does not use drugs.   Family History:  family history includes Cancer in his mother; Diabetes in his brother and father; Hypertension in his father.    ROS:     Review of Systems  Constitutional: Negative.   HENT: Negative.    Eyes: Negative.   Respiratory: Negative.    Gastrointestinal: Negative.   Genitourinary: Negative.   Musculoskeletal: Negative.   Skin: Negative.   Neurological: Negative.   Endo/Heme/Allergies: Negative.   Psychiatric/Behavioral: Negative.    All other systems reviewed and are negative.     All other systems are reviewed and negative.    PHYSICAL EXAM: VS:  BP (!) 100/58   Pulse 82   Ht 6\' 6"  (1.981 m)   Wt 239 lb (108.4 kg)   SpO2 97%   BMI 27.62 kg/m  , BMI Body mass index is 27.62 kg/m. Last weight:  Wt Readings from Last 3 Encounters:  06/19/23 239 lb (108.4 kg)  06/08/23 241 lb (109.3 kg)  04/26/23 238 lb 6.4 oz (108.1 kg)     Physical Exam    EKG:   Recent Labs: 08/20/2022: B Natriuretic Peptide 439.8 02/02/2023: Hemoglobin 10.3; Platelets 152 04/20/2023: ALT 26; BUN 27; Creatinine, Ser 1.92; Potassium 4.3; Sodium 139; TSH 2.680    Lipid Panel    Component Value Date/Time   CHOL 126 08/21/2022 0502   CHOL 125 05/10/2022 0917   TRIG 74 08/21/2022 0502   HDL 34 (L) 08/21/2022 0502   HDL 46 05/10/2022 0917   CHOLHDL 3.7 08/21/2022 0502   VLDL 15 08/21/2022 0502   LDLCALC 77 08/21/2022 0502   LDLCALC 65 05/10/2022 0917   LDLCALC 73 11/03/2020 1032      Other studies Reviewed: Additional studies/ records that were reviewed today include:  Review of the above records demonstrates:       No data to display            ASSESSMENT AND PLAN:    ICD-10-CM   1. Coronary artery disease of native artery of native heart with stable angina pectoris (HCC)  I25.118 furosemide (LASIX) 40 MG tablet   No chest pain    2. Chronic diastolic CHF (congestive heart failure) (HCC)   I50.32 furosemide (LASIX) 40 MG tablet   Compensated, stop aldactone , creat high, increase lasix 40    3. Atrial fibrillation, currently in sinus rhythm  Z86.79 furosemide (LASIX) 40 MG tablet    4. Mixed hyperlipidemia  E78.2 furosemide (LASIX) 40 MG tablet    5. SOB (shortness of breath)  R06.02 furosemide (LASIX) 40 MG tablet   unable to use farxiga as creat 1.9. stop  aldactone . Increase lasix 40 daily.       Problem List Items Addressed This Visit       Cardiovascular and Mediastinum   Coronary artery disease of native artery of native heart with stable angina pectoris (HCC) - Primary   Relevant Medications   furosemide (LASIX) 40 MG tablet   Chronic diastolic CHF (congestive heart failure) (HCC)   Relevant Medications   furosemide (LASIX) 40 MG tablet     Other   Hyperlipidemia   Relevant Medications   furosemide (LASIX) 40 MG tablet   SOB (shortness of breath)   Relevant Medications   furosemide (LASIX) 40 MG tablet   Other Visit Diagnoses       Atrial fibrillation, currently in sinus rhythm       Relevant Medications   furosemide (LASIX) 40 MG tablet          Disposition:   Return in about 4 weeks (around 07/17/2023).    Total time spent: 30 minutes  Signed,  Debborah Fairly, MD  06/19/2023 11:18 AM    [facility]

## 2023-06-20 ENCOUNTER — Other Ambulatory Visit (HOSPITAL_COMMUNITY): Payer: Self-pay

## 2023-06-20 DIAGNOSIS — I5032 Chronic diastolic (congestive) heart failure: Secondary | ICD-10-CM | POA: Diagnosis not present

## 2023-06-20 DIAGNOSIS — N1832 Chronic kidney disease, stage 3b: Secondary | ICD-10-CM | POA: Diagnosis not present

## 2023-06-20 DIAGNOSIS — N2581 Secondary hyperparathyroidism of renal origin: Secondary | ICD-10-CM | POA: Diagnosis not present

## 2023-06-20 DIAGNOSIS — E1129 Type 2 diabetes mellitus with other diabetic kidney complication: Secondary | ICD-10-CM | POA: Diagnosis not present

## 2023-06-21 ENCOUNTER — Encounter

## 2023-06-26 ENCOUNTER — Encounter

## 2023-06-26 ENCOUNTER — Ambulatory Visit: Attending: Neurology

## 2023-06-26 DIAGNOSIS — I69890 Apraxia following other cerebrovascular disease: Secondary | ICD-10-CM | POA: Diagnosis not present

## 2023-06-26 DIAGNOSIS — R4701 Aphasia: Secondary | ICD-10-CM | POA: Insufficient documentation

## 2023-06-26 NOTE — Therapy (Signed)
 OUTPATIENT SPEECH LANGUAGE PATHOLOGY APHASIA EVALUATION   Patient Name: Dylan Robles. MRN: [id number] DOB:December 12, 1940, 83 y.o., male Today's Date: 06/26/2023  PCP: Alison Irvine, FNP REFERRING PROVIDER: Devora Folks, MD   End of Session - 06/26/23 1508     Visit Number 1    Number of Visits 24    Date for SLP Re-Evaluation 09/18/23    Progress Note Due on Visit 10    SLP Start Time 1420    SLP Stop Time  1505    SLP Time Calculation (min) 45 min    Activity Tolerance Patient tolerated treatment well          Past Medical History:  Diagnosis Date   Acute lower UTI 09/20/2017   Bradycardia on ECG 07/31/2011   Coronary artery disease    hyperlipidemia   Decreased GFR 09/23/2019   Dehydration 09/21/2017   Diabetes mellitus    Diabetes mellitus without complication (HCC)    Hypercholesterolemia    Hypertension    Hypothyroidism    Left arm pain 01/29/2020   Paronychia of great toe of left foot 09/26/2018   Paronychia of great toe, left 09/26/2018   PERIPHERAL NEUROPATHY 01/13/2006   Qualifier: Diagnosis of  By: Seabron Cypress MD, Christine     Shortness of breath dyspnea    with exertion   Sleep apnea    Stroke (HCC) 08/10/2022   Vasovagal syncope    Weakness 09/20/2017   Past Surgical History:  Procedure Laterality Date   APPENDECTOMY     CARDIAC CATHETERIZATION N/A 06/16/2014   Procedure: Left Heart Cath;  Surgeon: Cherrie Cornwall, MD;  Location: [facility];  Service: Cardiovascular;  Laterality: N/A;   CIRCUMCISION, NON-NEWBORN     CYSTOSCOPY N/A 02/01/2023   Procedure: CYSTOSCOPY;  Surgeon: Marco Severs, MD;  Location: [facility];  Service: Urology;  Laterality: N/A;   HERNIA REPAIR     LOWER EXTREMITY ANGIOGRAPHY Left 12/24/2018   Procedure: LOWER EXTREMITY ANGIOGRAPHY;  Surgeon: Jackquelyn Mass, MD;  Location: [facility];  Service: Cardiovascular;  Laterality: Left;   THYROID  SURGERY     TRANSURETHRAL RESECTION OF PROSTATE N/A  02/01/2023   Procedure: TRANSURETHRAL RESECTION OF THE PROSTATE (TURP);  Surgeon: Marco Severs, MD;  Location: [facility];  Service: Urology;  Laterality: N/A;   Patient Active Problem List   Diagnosis Date Noted   Hematuria, gross 02/02/2023   Stroke (HCC) 10/27/2022   Bladder trabeculation 10/11/2022   TIA (transient ischemic attack) 08/20/2022   Type II diabetes mellitus with renal manifestations (HCC) 08/20/2022   Acute renal failure superimposed on stage 3a chronic kidney disease (HCC) 08/20/2022   Chronic diastolic CHF (congestive heart failure) (HCC) 08/20/2022   New onset atrial fibrillation (HCC) 08/20/2022   CAD (coronary artery disease) 08/20/2022   BPH (benign prostatic hyperplasia) 06/14/2022   Venous stasis dermatitis of right lower extremity 06/14/2022   SOB (shortness of breath) 04/18/2022   Bilateral lower extremity edema 02/15/2022   Overweight (BMI 25.0-29.9) 06/10/2021   Proctitis 05/17/2021   Stage 3a chronic kidney disease (HCC) 03/03/2020   Atherosclerosis of native arteries of extremity with intermittent claudication (HCC) 12/19/2018   Hyperlipidemia 12/19/2018   Diabetic neuropathy (HCC) 09/26/2018   Type 2 diabetes mellitus with vascular disease (HCC) 09/26/2018   Coronary artery disease of native artery of native heart with stable angina pectoris (HCC) 09/20/2017   Type 2 diabetes mellitus with hyperlipidemia (HCC) 09/16/2008   Hypothyroidism 03/29/2006   Essential hypertension  01/13/2006   PERIPHERAL VASCULAR DISEASE 01/13/2006    ONSET DATE: August 2024    REFERRING DIAG: CVA; slurred speech  THERAPY DIAG:  Aphasia  Apraxia following other cerebrovascular disease  Rationale for Evaluation and Treatment Rehabilitation  SUBJECTIVE:   SUBJECTIVE STATEMENT: Pt alert, pleasant, and cooperative.  Pt accompanied by: significant other  PERTINENT HISTORY & DIAGNOSTIC FINDINGS: Pt an 83 y/o male who presented to ED on 08/20/22 for R sided weakness  and difficulty speaking. Per Neurology note, he reports lingering word finding difficulty and slurred speech. MRI, 08/2022, showed small focus of acute ischemia at posterior left insula. PMH: CAD, HTN, HLD, dCHF, BPH, CKD-3A, peripheral neuropathy.   PAIN:  Are you having pain? No  FALLS: Has patient fallen in last 6 months?  No  LIVING ENVIRONMENT: Lives with: lives with spouse   PLOF:  Level of assistance: Needed assistance with IADLS Employment: Retired   PATIENT GOALS    to improve communication   OBJECTIVE:  COGNITION: Overall cognitive status: Difficulty to assess due to: Communication impairment   AUDITORY COMPREHENSION: Overall auditory comprehension: Impaired: moderately complex and complex    READING COMPREHENSION: DNT  EXPRESSION: verbal  VERBAL EXPRESSION: Overall verbal expression: Impaired: simple, moderately complex, and complex Level of generative/spontaneous verbalization: word, phrase, and sentence  WRITTEN EXPRESSION: Dominant hand: right  Written expression: Not tested  MOTOR SPEECH: Overall motor speech: impaired Level of impairment: Word, Phrase, and Sentence Respiration: clavicular breathing Phonation: low vocal intensity; mildly hoarse Resonance: WFL Articulation: Impaired: all levels Intelligibility: Intelligible Motor planning: Impaired: aware and inconsistent   ORAL MOTOR EXAMINATION WFL x voice  STANDARDIZED ASSESSMENTS:   Western Aphasia Battery- Revised  Spontaneous Speech                           Information content               10/10                                            Fluency                                 6/10                                          Comprehension     Yes/No questions                 57/60                                           Auditory Word Recognition  59/60                                Sequential Commands       64/80                              Repetition  76/100                                        Naming    Object Naming                     57/60                                           Word Fluency                        7/20                                            Sentence Completion          10/10                                             Responsive Speech              10/10                                         Aphasia Quotient                  84/100         Pt's severity rating was mild as indicated by an Aphasia Quotient of 84 (0-25=very severe, 26-50=severe, 51-75=moderate, 76 and above is mild). Pt's presentation is most consistent with Broca's subtype, characterized by impaired verbal expression, impaired auditory comprehension, and impaired repetition. Reading and writing TBA. Pt's verbal expression is characterized by non-fluent speech with semantic and phonemic paraphasias, significant wordfinding difficulty. Apraxia/dysarthria suspected.    PATIENT REPORTED OUTCOME MEASURES (PROM): Communicative Participation Item Bank (CPIB) Age Range: 18+   The Communicative Participation Item Bank (CPIB) is a patient-reported outcomes instrument that targets the construct of communicative participation [employer] et al., 2013). The CPIB was developed with the intent of being appropriate for community-dwelling adults in the variety of conversational situations they frequently engage in as part of life roles at home, at work, and in social and leisure situations. All items in the CPIB start with the stem, 'Does your condition interfere with..' followed by various conversational situations such as 'Making a phone call to get information,' or 'Having a conversation in a noisy place.' Respondents choose from four response categories to rate the level of interference they experience in that situation, ranging from 'Not at all,' to 'Very much.' The item bank consists of 46 items, and a paper-and-pencil 10-item disorder-generic short form is  available. The CPIB scores are reported as T-scores where 50 = the mean of the calibration sample and standard deviation = 10.  Does your condition interfere with talking with people you know? VERY MUCH (value=0) Does your condition interfere with communicating when you need to say something quickly? VERY MUCH (value=0) Does your condition interfere with talking with people you do NOT know?  VERY MUCH (value=0) Does your condition interfere with communicating when you are out in the community (e.g., running errands, appointments)? VERY MUCH (value=0) Does your condition interfere with asking questions in a conversation? VERY MUCH (value=0) Does your condition interfere with communicating in a small group of people? VERY MUCH (value=0) Does your condition interfere with having a long conversation with someone you know about a book, movie, show or sports event? VERY MUCH (value=0) Does your condition interfere with giving someone DETAILED information? VERY MUCH (value=0) Does your condition interfere with getting your turn in a fast-moving conversation? VERY MUCH (value=0) Does your condition interfere with trying to persuade a friend or family member to see a different point of view? VERY MUCH (value=0)  Pt's T-Score is 24.2 which is considered 2.5 SD below mean of 50.    TODAY'S TREATMENT:  Pt and wife educated re: role of SLP, changes to auditory comprehension and expressive communication appreciated, domains of language, s/sx aphasia and apraxia of speech, SLP POC, and positive prognostic indicators.    PATIENT EDUCATION: Education details: as above Person educated: Patient and Spouse Education method: Explanation Education comprehension: verbalized understanding and needs further education  HOME EXERCISE PROGRAM:   To be given in upcoming sessions    GOALS:  Goals reviewed with patient? Yes  SHORT TERM GOALS: Target date: 10 sessions  With Min A, patient will use a  circumlocution strategy, writing, drawing, and/or gesturing to describe target words with 80% accuracy to improve word-finding and reduce communication breakdowns. Baseline: Goal status: INITIAL  2.   With Min A, pt will perform moderately complex auditory comprehension tasks with 80% accuracy to reduce communication breakdowns.   Baseline:  Goal status: INITIAL  3.  With Min A, patient will describe visual scenes using 3 or more sentences at 80% accuracy.   Baseline:  Goal status: INITIAL  4.  With min A, pt will repeat 4-5 word phrases with 80% accuracy.  Baseline:  Goal status: INITIAL    LONG TERM GOALS: Target date: 12 weeks  With Min A, patient/family will demonstrate understanding of the following concepts: aphasia, spontaneous recovery, communication vs conversation, and strengths/strategies to promote success to increase patient's participation in medical care and ADLs/IADLS.   Baseline:  Goal status: INITIAL  2.  Pt will utilize compensatory strategies for anomia with no more than min assistance during informal conversational exchanges. Baseline:  Goal status: INITIAL  3.  Pt will report improved communication per PROM. Baseline:  Goal status: INITIAL    ASSESSMENT:  CLINICAL IMPRESSION: Patient is a 83 y.o. male who was seen today for speech/language assessment in setting of stroke. Assessment completed via WAB-R and CPIB. Pt's severity rating was mild as indicated by an Aphasia Quotient of 84 (0-25=very severe, 26-50=severe, 51-75=moderate, 76 and above is mild). Pt's presentation is most consistent with Broca's subtype, characterized by impaired verbal expression, impaired auditory comprehension, and impaired repetition. Reading and writing TBA. Pt's verbal expression is characterized by non-fluent speech with semantic and phonemic paraphasias, significant wordfinding difficulty. Pt with concomittant apraxia of speech and mild dysarthria. Despite, pt's speech is  largely intelligible. Recommend course of ST with emphasis on compensation and education.    OBJECTIVE IMPAIRMENTS include expressive language, receptive language, aphasia, and apraxia. These impairments are limiting patient from effectively communicating at home and in community. Factors affecting potential to achieve goals and functional outcome are time post-onset. Patient will benefit from skilled SLP services to address above impairments and improve overall function.  REHAB POTENTIAL: Fair -Good  PLAN: SLP FREQUENCY: 1-2x/week  SLP DURATION: 12 weeks  PLANNED INTERVENTIONS: Cueing hierachy, Functional tasks, Multimodal communication approach, SLP instruction and feedback, Compensatory strategies, Patient/family education, and [id number] Treatment of speech (30 or 45 min)     Dia Forget, M.S., CCC-SLP Speech-Language Pathologist [facility] [facility] [phone])  [facility] at [facility] [address] [place], [place], [address] Phone: [phone]   Fax:  [phone]

## 2023-06-28 ENCOUNTER — Encounter

## 2023-06-28 ENCOUNTER — Ambulatory Visit

## 2023-06-28 ENCOUNTER — Other Ambulatory Visit (HOSPITAL_COMMUNITY): Payer: Self-pay

## 2023-06-28 DIAGNOSIS — R4701 Aphasia: Secondary | ICD-10-CM

## 2023-06-28 DIAGNOSIS — I69890 Apraxia following other cerebrovascular disease: Secondary | ICD-10-CM

## 2023-06-28 NOTE — Therapy (Signed)
 OUTPATIENT SPEECH LANGUAGE PATHOLOGY APHASIA TREATMENT   Patient Name: Dylan Robles. MRN: [id number] DOB:24-Mar-1940, 83 y.o., male Today's Date: 06/28/2023  PCP: Alison Irvine, FNP REFERRING PROVIDER: Devora Folks, MD   End of Session - 06/28/23 1354     Visit Number 2    Number of Visits 24    Date for SLP Re-Evaluation 09/18/23    SLP Start Time 1400    SLP Stop Time  1445    SLP Time Calculation (min) 45 min    Activity Tolerance Patient tolerated treatment well          Past Medical History:  Diagnosis Date   Acute lower UTI 09/20/2017   Bradycardia on ECG 07/31/2011   Coronary artery disease    hyperlipidemia   Decreased GFR 09/23/2019   Dehydration 09/21/2017   Diabetes mellitus    Diabetes mellitus without complication (HCC)    Hypercholesterolemia    Hypertension    Hypothyroidism    Left arm pain 01/29/2020   Paronychia of great toe of left foot 09/26/2018   Paronychia of great toe, left 09/26/2018   PERIPHERAL NEUROPATHY 01/13/2006   Qualifier: Diagnosis of  By: Seabron Cypress MD, Christine     Shortness of breath dyspnea    with exertion   Sleep apnea    Stroke (HCC) 08/10/2022   Vasovagal syncope    Weakness 09/20/2017   Past Surgical History:  Procedure Laterality Date   APPENDECTOMY     CARDIAC CATHETERIZATION N/A 06/16/2014   Procedure: Left Heart Cath;  Surgeon: Cherrie Cornwall, MD;  Location: [facility];  Service: Cardiovascular;  Laterality: N/A;   CIRCUMCISION, NON-NEWBORN     CYSTOSCOPY N/A 02/01/2023   Procedure: CYSTOSCOPY;  Surgeon: Marco Severs, MD;  Location: [facility];  Service: Urology;  Laterality: N/A;   HERNIA REPAIR     LOWER EXTREMITY ANGIOGRAPHY Left 12/24/2018   Procedure: LOWER EXTREMITY ANGIOGRAPHY;  Surgeon: Jackquelyn Mass, MD;  Location: [facility];  Service: Cardiovascular;  Laterality: Left;   THYROID  SURGERY     TRANSURETHRAL RESECTION OF PROSTATE N/A 02/01/2023   Procedure: TRANSURETHRAL  RESECTION OF THE PROSTATE (TURP);  Surgeon: Marco Severs, MD;  Location: [facility];  Service: Urology;  Laterality: N/A;   Patient Active Problem List   Diagnosis Date Noted   Hematuria, gross 02/02/2023   Stroke (HCC) 10/27/2022   Bladder trabeculation 10/11/2022   TIA (transient ischemic attack) 08/20/2022   Type II diabetes mellitus with renal manifestations (HCC) 08/20/2022   Acute renal failure superimposed on stage 3a chronic kidney disease (HCC) 08/20/2022   Chronic diastolic CHF (congestive heart failure) (HCC) 08/20/2022   New onset atrial fibrillation (HCC) 08/20/2022   CAD (coronary artery disease) 08/20/2022   BPH (benign prostatic hyperplasia) 06/14/2022   Venous stasis dermatitis of right lower extremity 06/14/2022   SOB (shortness of breath) 04/18/2022   Bilateral lower extremity edema 02/15/2022   Overweight (BMI 25.0-29.9) 06/10/2021   Proctitis 05/17/2021   Stage 3a chronic kidney disease (HCC) 03/03/2020   Atherosclerosis of native arteries of extremity with intermittent claudication (HCC) 12/19/2018   Hyperlipidemia 12/19/2018   Diabetic neuropathy (HCC) 09/26/2018   Type 2 diabetes mellitus with vascular disease (HCC) 09/26/2018   Coronary artery disease of native artery of native heart with stable angina pectoris (HCC) 09/20/2017   Type 2 diabetes mellitus with hyperlipidemia (HCC) 09/16/2008   Hypothyroidism 03/29/2006   Essential hypertension 01/13/2006   PERIPHERAL VASCULAR DISEASE 01/13/2006  ONSET DATE: August 2024    REFERRING DIAG: CVA; slurred speech  THERAPY DIAG:  Aphasia  Apraxia following other cerebrovascular disease  Rationale for Evaluation and Treatment Rehabilitation  SUBJECTIVE:   SUBJECTIVE STATEMENT: Pt alert, pleasant, and cooperative.  Pt accompanied by: significant other  PERTINENT HISTORY & DIAGNOSTIC FINDINGS: Pt an 83 y/o male who presented to ED on 08/20/22 for R sided weakness and difficulty speaking. Per Neurology  note, he reports lingering word finding difficulty and slurred speech. MRI, 08/2022, showed small focus of acute ischemia at posterior left insula. PMH: CAD, HTN, HLD, dCHF, BPH, CKD-3A, peripheral neuropathy.   PAIN:  Are you having pain? No  FALLS: Has patient fallen in last 6 months?  No  LIVING ENVIRONMENT: Lives with: lives with spouse   PLOF:  Level of assistance: Needed assistance with IADLS Employment: Retired   PATIENT GOALS    to improve communication   OBJECTIVE:  TODAY'S TREATMENT:  Introduced Engineer, agricultural. Pt utlized with mod/max cueing initially to describe x10 objects; reducing to mod cueing with repeated practice. Wife educated on how to cue pt through SFA task. Pt given SFA practice worksheet for homework.     PATIENT EDUCATION: Education details: as above Person educated: Patient and Spouse Education method: Explanation Education comprehension: verbalized understanding and needs further education  HOME EXERCISE PROGRAM:   SFA handout    GOALS:  Goals reviewed with patient? Yes  SHORT TERM GOALS: Target date: 10 sessions  With Min A, patient will use a circumlocution strategy, writing, drawing, and/or gesturing to describe target words with 80% accuracy to improve word-finding and reduce communication breakdowns. Baseline: Goal status: INITIAL  2.   With Min A, pt will perform moderately complex auditory comprehension tasks with 80% accuracy to reduce communication breakdowns.   Baseline:  Goal status: INITIAL  3.  With Min A, patient will describe visual scenes using 3 or more sentences at 80% accuracy.   Baseline:  Goal status: INITIAL  4.  With min A, pt will repeat 4-5 word phrases with 80% accuracy.  Baseline:  Goal status: INITIAL    LONG TERM GOALS: Target date: 12 weeks  With Min A, patient/family will demonstrate understanding of the following concepts: aphasia, spontaneous recovery, communication vs  conversation, and strengths/strategies to promote success to increase patient's participation in medical care and ADLs/IADLS.   Baseline:  Goal status: INITIAL  2.  Pt will utilize compensatory strategies for anomia with no more than min assistance during informal conversational exchanges. Baseline:  Goal status: INITIAL  3.  Pt will report improved communication per PROM. Baseline:  Goal status: INITIAL    ASSESSMENT:  CLINICAL IMPRESSION: Patient is a 83 y.o. male who was seen today for speech/language treatment in setting of stroke. Initial ssessment completed via WAB-R and CPIB. Pt's severity rating was mild as indicated by an Aphasia Quotient of 84 (0-25=very severe, 26-50=severe, 51-75=moderate, 76 and above is mild). Pt's presentation is most consistent with Broca's subtype, characterized by impaired verbal expression, impaired auditory comprehension, and impaired repetition. Reading and writing TBA. Pt's verbal expression is characterized by non-fluent speech with semantic and phonemic paraphasias, significant wordfinding difficulty. Pt with concomittant apraxia of speech and mild dysarthria. Despite, pt's speech is largely intelligible. See details of today's tx session above. Recommend course of ST with emphasis on compensation and education.    OBJECTIVE IMPAIRMENTS include expressive language, receptive language, aphasia, and apraxia. These impairments are limiting patient from effectively communicating at home and in  community. Factors affecting potential to achieve goals and functional outcome are time post-onset. Patient will benefit from skilled SLP services to address above impairments and improve overall function.  REHAB POTENTIAL: Fair -Good  PLAN: SLP FREQUENCY: 1-2x/week  SLP DURATION: 12 weeks  PLANNED INTERVENTIONS: Cueing hierachy, Functional tasks, Multimodal communication approach, SLP instruction and feedback, Compensatory strategies, Patient/family education,  and [address] Treatment of speech (30 or 45 min)     Dia Forget, M.S., CCC-SLP Speech-Language Pathologist [place] [facility] [id number] [place])  [facility] Outpatient Rehabilitation at [facility] [address] [place], [place], [address] Phone: [phone]   Fax:  [phone]

## 2023-07-03 ENCOUNTER — Ambulatory Visit

## 2023-07-03 ENCOUNTER — Encounter

## 2023-07-03 DIAGNOSIS — R4701 Aphasia: Secondary | ICD-10-CM

## 2023-07-03 DIAGNOSIS — I69890 Apraxia following other cerebrovascular disease: Secondary | ICD-10-CM

## 2023-07-03 NOTE — Therapy (Signed)
 OUTPATIENT SPEECH LANGUAGE PATHOLOGY APHASIA TREATMENT   Patient Name: Dylan Robles. MRN: [id number] DOB:March 05, 1940, 83 y.o., male Today's Date: 07/03/2023  PCP: Leita Longs, FNP REFERRING PROVIDER: Jannett Fairly, MD   End of Session - 07/03/23 1449     Visit Number 3    Number of Visits 24    Date for SLP Re-Evaluation 09/18/23    SLP Start Time 1450    SLP Stop Time  1530    SLP Time Calculation (min) 40 min    Activity Tolerance Patient tolerated treatment well          Past Medical History:  Diagnosis Date   Acute lower UTI 09/20/2017   Bradycardia on ECG 07/31/2011   Coronary artery disease    hyperlipidemia   Decreased GFR 09/23/2019   Dehydration 09/21/2017   Diabetes mellitus    Diabetes mellitus without complication (HCC)    Hypercholesterolemia    Hypertension    Hypothyroidism    Left arm pain 01/29/2020   Paronychia of great toe of left foot 09/26/2018   Paronychia of great toe, left 09/26/2018   PERIPHERAL NEUROPATHY 01/13/2006   Qualifier: Diagnosis of  By: Tamra MD, Christine     Shortness of breath dyspnea    with exertion   Sleep apnea    Stroke (HCC) 08/10/2022   Vasovagal syncope    Weakness 09/20/2017   Past Surgical History:  Procedure Laterality Date   APPENDECTOMY     CARDIAC CATHETERIZATION N/A 06/16/2014   Procedure: Left Heart Cath;  Surgeon: Denyse DELENA Bathe, MD;  Location: [facility];  Service: Cardiovascular;  Laterality: N/A;   CIRCUMCISION, NON-NEWBORN     CYSTOSCOPY N/A 02/01/2023   Procedure: CYSTOSCOPY;  Surgeon: Sherrilee Belvie CROME, MD;  Location: [facility];  Service: Urology;  Laterality: N/A;   HERNIA REPAIR     LOWER EXTREMITY ANGIOGRAPHY Left 12/24/2018   Procedure: LOWER EXTREMITY ANGIOGRAPHY;  Surgeon: Jama Cordella MATSU, MD;  Location: [facility];  Service: Cardiovascular;  Laterality: Left;   THYROID  SURGERY     TRANSURETHRAL RESECTION OF PROSTATE N/A 02/01/2023   Procedure: TRANSURETHRAL  RESECTION OF THE PROSTATE (TURP);  Surgeon: Sherrilee Belvie CROME, MD;  Location: [facility];  Service: Urology;  Laterality: N/A;   Patient Active Problem List   Diagnosis Date Noted   Hematuria, gross 02/02/2023   Stroke (HCC) 10/27/2022   Bladder trabeculation 10/11/2022   TIA (transient ischemic attack) 08/20/2022   Type II diabetes mellitus with renal manifestations (HCC) 08/20/2022   Acute renal failure superimposed on stage 3a chronic kidney disease (HCC) 08/20/2022   Chronic diastolic CHF (congestive heart failure) (HCC) 08/20/2022   New onset atrial fibrillation (HCC) 08/20/2022   CAD (coronary artery disease) 08/20/2022   BPH (benign prostatic hyperplasia) 06/14/2022   Venous stasis dermatitis of right lower extremity 06/14/2022   SOB (shortness of breath) 04/18/2022   Bilateral lower extremity edema 02/15/2022   Overweight (BMI 25.0-29.9) 06/10/2021   Proctitis 05/17/2021   Stage 3a chronic kidney disease (HCC) 03/03/2020   Atherosclerosis of native arteries of extremity with intermittent claudication (HCC) 12/19/2018   Hyperlipidemia 12/19/2018   Diabetic neuropathy (HCC) 09/26/2018   Type 2 diabetes mellitus with vascular disease (HCC) 09/26/2018   Coronary artery disease of native artery of native heart with stable angina pectoris (HCC) 09/20/2017   Type 2 diabetes mellitus with hyperlipidemia (HCC) 09/16/2008   Hypothyroidism 03/29/2006   Essential hypertension 01/13/2006   PERIPHERAL VASCULAR DISEASE 01/13/2006  ONSET DATE: August 2024    REFERRING DIAG: CVA; slurred speech  THERAPY DIAG:  Aphasia  Apraxia following other cerebrovascular disease  Rationale for Evaluation and Treatment Rehabilitation  SUBJECTIVE:   SUBJECTIVE STATEMENT: Pt alert, pleasant, and cooperative.  Pt accompanied by: significant other  PERTINENT HISTORY & DIAGNOSTIC FINDINGS: Pt an 83 y/o male who presented to ED on 08/20/22 for R sided weakness and difficulty speaking. Per Neurology  note, he reports lingering word finding difficulty and slurred speech. MRI, 08/2022, showed small focus of acute ischemia at posterior left insula. PMH: CAD, HTN, HLD, dCHF, BPH, CKD-3A, peripheral neuropathy.   PAIN:  Are you having pain? No  FALLS: Has patient fallen in last 6 months?  No  LIVING ENVIRONMENT: Lives with: lives with spouse   PLOF:  Level of assistance: Needed assistance with IADLS Employment: Retired   PATIENT GOALS    to improve communication   OBJECTIVE:  TODAY'S TREATMENT:  Reviewed semantic features analysis. Pt utlized with mod/max cueing initially to describe x10 objects; reducing to mod cueing with repeated practice. Wife educated on how to cue pt through SFA task. Pt set up on [email or web address] for HEP. Pt utilized [employer] app for repetition task - pt repeated 3-5 word phrases with 80% accuracy indep; 100% with min/mod cues.   Pt and wife participated in skilled education re: changes to language from stroke, aphasia, apraxia of speech, domains of language, and pt's CLOF.    PATIENT EDUCATION: Education details: as above Person educated: Patient and Spouse Education method: Explanation Education comprehension: verbalized understanding and needs further education  HOME EXERCISE PROGRAM:   SFA handout    GOALS:  Goals reviewed with patient? Yes  SHORT TERM GOALS: Target date: 10 sessions  With Min A, patient will use a circumlocution strategy, writing, drawing, and/or gesturing to describe target words with 80% accuracy to improve word-finding and reduce communication breakdowns. Baseline: Goal status: INITIAL  2.   With Min A, pt will perform moderately complex auditory comprehension tasks with 80% accuracy to reduce communication breakdowns.   Baseline:  Goal status: INITIAL  3.  With Min A, patient will describe visual scenes using 3 or more sentences at 80% accuracy.   Baseline:  Goal status: INITIAL  4.  With min A, pt will  repeat 4-5 word phrases with 80% accuracy.  Baseline:  Goal status: INITIAL    LONG TERM GOALS: Target date: 12 weeks  With Min A, patient/family will demonstrate understanding of the following concepts: aphasia, spontaneous recovery, communication vs conversation, and strengths/strategies to promote success to increase patient's participation in medical care and ADLs/IADLS.   Baseline:  Goal status: INITIAL  2.  Pt will utilize compensatory strategies for anomia with no more than min assistance during informal conversational exchanges. Baseline:  Goal status: INITIAL  3.  Pt will report improved communication per PROM. Baseline:  Goal status: INITIAL    ASSESSMENT:  CLINICAL IMPRESSION: Patient is a 83 y.o. male who was seen today for speech/language treatment in setting of stroke. Initial ssessment completed via WAB-R and CPIB. Pt's severity rating was mild as indicated by an Aphasia Quotient of 84 (0-25=very severe, 26-50=severe, 51-75=moderate, 76 and above is mild). Pt's presentation is most consistent with Broca's subtype, characterized by impaired verbal expression, impaired auditory comprehension, and impaired repetition. Reading and writing TBA. Pt's verbal expression is characterized by non-fluent speech with semantic and phonemic paraphasias, significant wordfinding difficulty. Pt with concomittant apraxia of speech and mild dysarthria. Despite, pt's speech  is largely intelligible. See details of today's tx session above. Recommend course of ST with emphasis on compensation and education.    OBJECTIVE IMPAIRMENTS include expressive language, receptive language, aphasia, and apraxia. These impairments are limiting patient from effectively communicating at home and in community. Factors affecting potential to achieve goals and functional outcome are time post-onset. Patient will benefit from skilled SLP services to address above impairments and improve overall function.  REHAB  POTENTIAL: Fair -Good  PLAN: SLP FREQUENCY: 1-2x/week  SLP DURATION: 12 weeks  PLANNED INTERVENTIONS: Cueing hierachy, Functional tasks, Multimodal communication approach, SLP instruction and feedback, Compensatory strategies, Patient/family education, and 07492 Treatment of speech (30 or 45 min)     Delon Bangs, M.S., CCC-SLP Speech-Language Pathologist [place] [facility] [phone] [place])  [facility] Outpatient Rehabilitation at [facility] [address] [place], [place], [address] Phone: [phone]   Fax:  [phone]

## 2023-07-05 ENCOUNTER — Ambulatory Visit

## 2023-07-05 ENCOUNTER — Other Ambulatory Visit (HOSPITAL_COMMUNITY): Payer: Self-pay

## 2023-07-05 DIAGNOSIS — R4701 Aphasia: Secondary | ICD-10-CM

## 2023-07-05 DIAGNOSIS — I69890 Apraxia following other cerebrovascular disease: Secondary | ICD-10-CM

## 2023-07-05 NOTE — Therapy (Signed)
 OUTPATIENT SPEECH LANGUAGE PATHOLOGY APHASIA TREATMENT   Patient Name: Dylan Robles. MRN: [id number] DOB:1940/09/19, 83 y.o., male Today's Date: 07/05/2023  PCP: Leita Longs, FNP REFERRING PROVIDER: Jannett Fairly, MD   End of Session - 07/05/23 1426     Visit Number 4    Number of Visits 24    Date for SLP Re-Evaluation 09/18/23    Progress Note Due on Visit 10    SLP Start Time 1445    SLP Stop Time  1530    SLP Time Calculation (min) 45 min    Activity Tolerance Patient tolerated treatment well          Past Medical History:  Diagnosis Date   Acute lower UTI 09/20/2017   Bradycardia on ECG 07/31/2011   Coronary artery disease    hyperlipidemia   Decreased GFR 09/23/2019   Dehydration 09/21/2017   Diabetes mellitus    Diabetes mellitus without complication (HCC)    Hypercholesterolemia    Hypertension    Hypothyroidism    Left arm pain 01/29/2020   Paronychia of great toe of left foot 09/26/2018   Paronychia of great toe, left 09/26/2018   PERIPHERAL NEUROPATHY 01/13/2006   Qualifier: Diagnosis of  By: Tamra MD, Christine     Shortness of breath dyspnea    with exertion   Sleep apnea    Stroke (HCC) 08/10/2022   Vasovagal syncope    Weakness 09/20/2017   Past Surgical History:  Procedure Laterality Date   APPENDECTOMY     CARDIAC CATHETERIZATION N/A 06/16/2014   Procedure: Left Heart Cath;  Surgeon: Denyse DELENA Bathe, MD;  Location: [facility];  Service: Cardiovascular;  Laterality: N/A;   CIRCUMCISION, NON-NEWBORN     CYSTOSCOPY N/A 02/01/2023   Procedure: CYSTOSCOPY;  Surgeon: Sherrilee Belvie CROME, MD;  Location: [facility];  Service: Urology;  Laterality: N/A;   HERNIA REPAIR     LOWER EXTREMITY ANGIOGRAPHY Left 12/24/2018   Procedure: LOWER EXTREMITY ANGIOGRAPHY;  Surgeon: Jama Cordella MATSU, MD;  Location: [facility];  Service: Cardiovascular;  Laterality: Left;   THYROID  SURGERY     TRANSURETHRAL RESECTION OF PROSTATE N/A  02/01/2023   Procedure: TRANSURETHRAL RESECTION OF THE PROSTATE (TURP);  Surgeon: Sherrilee Belvie CROME, MD;  Location: [facility];  Service: Urology;  Laterality: N/A;   Patient Active Problem List   Diagnosis Date Noted   Hematuria, gross 02/02/2023   Stroke (HCC) 10/27/2022   Bladder trabeculation 10/11/2022   TIA (transient ischemic attack) 08/20/2022   Type II diabetes mellitus with renal manifestations (HCC) 08/20/2022   Acute renal failure superimposed on stage 3a chronic kidney disease (HCC) 08/20/2022   Chronic diastolic CHF (congestive heart failure) (HCC) 08/20/2022   New onset atrial fibrillation (HCC) 08/20/2022   CAD (coronary artery disease) 08/20/2022   BPH (benign prostatic hyperplasia) 06/14/2022   Venous stasis dermatitis of right lower extremity 06/14/2022   SOB (shortness of breath) 04/18/2022   Bilateral lower extremity edema 02/15/2022   Overweight (BMI 25.0-29.9) 06/10/2021   Proctitis 05/17/2021   Stage 3a chronic kidney disease (HCC) 03/03/2020   Atherosclerosis of native arteries of extremity with intermittent claudication (HCC) 12/19/2018   Hyperlipidemia 12/19/2018   Diabetic neuropathy (HCC) 09/26/2018   Type 2 diabetes mellitus with vascular disease (HCC) 09/26/2018   Coronary artery disease of native artery of native heart with stable angina pectoris (HCC) 09/20/2017   Type 2 diabetes mellitus with hyperlipidemia (HCC) 09/16/2008   Hypothyroidism 03/29/2006   Essential hypertension  01/13/2006   PERIPHERAL VASCULAR DISEASE 01/13/2006    ONSET DATE: August 2024    REFERRING DIAG: CVA; slurred speech  THERAPY DIAG:  Aphasia  Apraxia following other cerebrovascular disease  Rationale for Evaluation and Treatment Rehabilitation  SUBJECTIVE:   SUBJECTIVE STATEMENT: Pt alert, pleasant, and cooperative.  Pt accompanied by: significant other  PERTINENT HISTORY & DIAGNOSTIC FINDINGS: Pt an 83 y/o male who presented to ED on 08/20/22 for R sided weakness  and difficulty speaking. Per Neurology note, he reports lingering word finding difficulty and slurred speech. MRI, 08/2022, showed small focus of acute ischemia at posterior left insula. PMH: CAD, HTN, HLD, dCHF, BPH, CKD-3A, peripheral neuropathy.   PAIN:  Are you having pain? No  FALLS: Has patient fallen in last 6 months?  No  LIVING ENVIRONMENT: Lives with: lives with spouse   PLOF:  Level of assistance: Needed assistance with IADLS Employment: Retired   PATIENT GOALS    to improve communication   OBJECTIVE:  TODAY'S TREATMENT:  Reviewed semantic features analysis. Pt endorsed using pausing, gestures, and SFA to repair communication breakdowns. Pt completed describe to name task with 80% accuracy indep; 100% with min/mod cues. Pt generated sentences for pictured scenes with 80% accuracy indep; improving to 100% with min/mod cues. Pt completed divergent naming task averaging 4 items per concrete category, improving to 5 items with use of SFA and gesturing.       PATIENT EDUCATION: Education details: as above Person educated: Patient and Spouse Education method: Explanation Education comprehension: verbalized understanding and needs further education  HOME EXERCISE PROGRAM:   SFA handout    GOALS:  Goals reviewed with patient? Yes  SHORT TERM GOALS: Target date: 10 sessions  With Min A, patient will use a circumlocution strategy, writing, drawing, and/or gesturing to describe target words with 80% accuracy to improve word-finding and reduce communication breakdowns. Baseline: Goal status: INITIAL  2.   With Min A, pt will perform moderately complex auditory comprehension tasks with 80% accuracy to reduce communication breakdowns.   Baseline:  Goal status: INITIAL  3.  With Min A, patient will describe visual scenes using 3 or more sentences at 80% accuracy.   Baseline:  Goal status: INITIAL  4.  With min A, pt will repeat 4-5 word phrases with 80%  accuracy.  Baseline:  Goal status: INITIAL    LONG TERM GOALS: Target date: 12 weeks  With Min A, patient/family will demonstrate understanding of the following concepts: aphasia, spontaneous recovery, communication vs conversation, and strengths/strategies to promote success to increase patient's participation in medical care and ADLs/IADLS.   Baseline:  Goal status: INITIAL  2.  Pt will utilize compensatory strategies for anomia with no more than min assistance during informal conversational exchanges. Baseline:  Goal status: INITIAL  3.  Pt will report improved communication per PROM. Baseline:  Goal status: INITIAL    ASSESSMENT:  CLINICAL IMPRESSION: Patient is a 83 y.o. male who was seen today for speech/language treatment in setting of stroke. Initial ssessment completed via WAB-R and CPIB. Pt's severity rating was mild as indicated by an Aphasia Quotient of 84 (0-25=very severe, 26-50=severe, 51-75=moderate, 76 and above is mild). Pt's presentation is most consistent with Broca's subtype, characterized by impaired verbal expression, impaired auditory comprehension, and impaired repetition. Reading and writing TBA. Pt's verbal expression is characterized by non-fluent speech with semantic and phonemic paraphasias, significant wordfinding difficulty. Pt with concomittant apraxia of speech and mild dysarthria. Despite, pt's speech is largely intelligible. See details  of today's tx session above. Recommend course of ST with emphasis on compensation and education.    OBJECTIVE IMPAIRMENTS include expressive language, receptive language, aphasia, and apraxia. These impairments are limiting patient from effectively communicating at home and in community. Factors affecting potential to achieve goals and functional outcome are time post-onset. Patient will benefit from skilled SLP services to address above impairments and improve overall function.  REHAB POTENTIAL: Fair  -Good  PLAN: SLP FREQUENCY: 1-2x/week  SLP DURATION: 12 weeks  PLANNED INTERVENTIONS: Cueing hierachy, Functional tasks, Multimodal communication approach, SLP instruction and feedback, Compensatory strategies, Patient/family education, and 07492 Treatment of speech (30 or 45 min)     Delon Bangs, M.S., CCC-SLP Speech-Language Pathologist [facility] [phone] [place])  [facility] at [facility] [address] [place], [place], [address] Phone: [phone]   Fax:  [phone]

## 2023-07-06 ENCOUNTER — Other Ambulatory Visit: Payer: Self-pay

## 2023-07-06 ENCOUNTER — Other Ambulatory Visit (HOSPITAL_COMMUNITY): Payer: Self-pay

## 2023-07-10 ENCOUNTER — Ambulatory Visit: Attending: Neurology

## 2023-07-10 DIAGNOSIS — R4701 Aphasia: Secondary | ICD-10-CM | POA: Insufficient documentation

## 2023-07-10 DIAGNOSIS — I69890 Apraxia following other cerebrovascular disease: Secondary | ICD-10-CM | POA: Diagnosis not present

## 2023-07-10 NOTE — Therapy (Signed)
 OUTPATIENT SPEECH LANGUAGE PATHOLOGY APHASIA TREATMENT   Patient Name: Dylan Robles. MRN: [id number] DOB:24-Oct-1940, 83 y.o., male Today's Date: 07/10/2023  PCP: Leita Longs, FNP REFERRING PROVIDER: Jannett Fairly, MD   End of Session - 07/10/23 1528     Visit Number 5    Number of Visits 24    Date for SLP Re-Evaluation 09/18/23    SLP Start Time 1530    SLP Stop Time  1615    SLP Time Calculation (min) 45 min    Activity Tolerance Patient tolerated treatment well          Past Medical History:  Diagnosis Date   Acute lower UTI 09/20/2017   Bradycardia on ECG 07/31/2011   Coronary artery disease    hyperlipidemia   Decreased GFR 09/23/2019   Dehydration 09/21/2017   Diabetes mellitus    Diabetes mellitus without complication (HCC)    Hypercholesterolemia    Hypertension    Hypothyroidism    Left arm pain 01/29/2020   Paronychia of great toe of left foot 09/26/2018   Paronychia of great toe, left 09/26/2018   PERIPHERAL NEUROPATHY 01/13/2006   Qualifier: Diagnosis of  By: Tamra MD, Christine     Shortness of breath dyspnea    with exertion   Sleep apnea    Stroke (HCC) 08/10/2022   Vasovagal syncope    Weakness 09/20/2017   Past Surgical History:  Procedure Laterality Date   APPENDECTOMY     CARDIAC CATHETERIZATION N/A 06/16/2014   Procedure: Left Heart Cath;  Surgeon: Denyse DELENA Bathe, MD;  Location: [facility];  Service: Cardiovascular;  Laterality: N/A;   CIRCUMCISION, NON-NEWBORN     CYSTOSCOPY N/A 02/01/2023   Procedure: CYSTOSCOPY;  Surgeon: Sherrilee Belvie CROME, MD;  Location: [facility];  Service: Urology;  Laterality: N/A;   HERNIA REPAIR     LOWER EXTREMITY ANGIOGRAPHY Left 12/24/2018   Procedure: LOWER EXTREMITY ANGIOGRAPHY;  Surgeon: Jama Cordella MATSU, MD;  Location: [facility];  Service: Cardiovascular;  Laterality: Left;   THYROID  SURGERY     TRANSURETHRAL RESECTION OF PROSTATE N/A 02/01/2023   Procedure: TRANSURETHRAL  RESECTION OF THE PROSTATE (TURP);  Surgeon: Sherrilee Belvie CROME, MD;  Location: [facility];  Service: Urology;  Laterality: N/A;   Patient Active Problem List   Diagnosis Date Noted   Hematuria, gross 02/02/2023   Stroke (HCC) 10/27/2022   Bladder trabeculation 10/11/2022   TIA (transient ischemic attack) 08/20/2022   Type II diabetes mellitus with renal manifestations (HCC) 08/20/2022   Acute renal failure superimposed on stage 3a chronic kidney disease (HCC) 08/20/2022   Chronic diastolic CHF (congestive heart failure) (HCC) 08/20/2022   New onset atrial fibrillation (HCC) 08/20/2022   CAD (coronary artery disease) 08/20/2022   BPH (benign prostatic hyperplasia) 06/14/2022   Venous stasis dermatitis of right lower extremity 06/14/2022   SOB (shortness of breath) 04/18/2022   Bilateral lower extremity edema 02/15/2022   Overweight (BMI 25.0-29.9) 06/10/2021   Proctitis 05/17/2021   Stage 3a chronic kidney disease (HCC) 03/03/2020   Atherosclerosis of native arteries of extremity with intermittent claudication (HCC) 12/19/2018   Hyperlipidemia 12/19/2018   Diabetic neuropathy (HCC) 09/26/2018   Type 2 diabetes mellitus with vascular disease (HCC) 09/26/2018   Coronary artery disease of native artery of native heart with stable angina pectoris (HCC) 09/20/2017   Type 2 diabetes mellitus with hyperlipidemia (HCC) 09/16/2008   Hypothyroidism 03/29/2006   Essential hypertension 01/13/2006   PERIPHERAL VASCULAR DISEASE 01/13/2006  ONSET DATE: August 2024    REFERRING DIAG: CVA; slurred speech  THERAPY DIAG:  Aphasia  Apraxia following other cerebrovascular disease  Rationale for Evaluation and Treatment Rehabilitation  SUBJECTIVE:   SUBJECTIVE STATEMENT: Pt alert, pleasant, and cooperative.  Pt accompanied by: significant other  PERTINENT HISTORY & DIAGNOSTIC FINDINGS: Pt an 83 y/o male who presented to ED on 08/20/22 for R sided weakness and difficulty speaking. Per Neurology  note, he reports lingering word finding difficulty and slurred speech. MRI, 08/2022, showed small focus of acute ischemia at posterior left insula. PMH: CAD, HTN, HLD, dCHF, BPH, CKD-3A, peripheral neuropathy.   PAIN:  Are you having pain? No  FALLS: Has patient fallen in last 6 months?  No  LIVING ENVIRONMENT: Lives with: lives with spouse   PLOF:  Level of assistance: Needed assistance with IADLS Employment: Retired   PATIENT GOALS    to improve communication   OBJECTIVE:  TODAY'S TREATMENT:  Reviewed semantic features analysis. Pt endorsed using pausing, gestures, and SFA to repair communication breakdowns. Pt demo'd SFA with min cues during structured task. Pt completed describe to name task with 80% accuracy indep; 100% with min/mod cues. Pt completed divergent naming averaging 5 items per concrete category with extra time.      PATIENT EDUCATION: Education details: as above Person educated: Patient and Spouse Education method: Explanation Education comprehension: verbalized understanding and needs further education  HOME EXERCISE PROGRAM:   SFA handout    GOALS:  Goals reviewed with patient? Yes  SHORT TERM GOALS: Target date: 10 sessions  With Min A, patient will use a circumlocution strategy, writing, drawing, and/or gesturing to describe target words with 80% accuracy to improve word-finding and reduce communication breakdowns. Baseline: Goal status: INITIAL  2.   With Min A, pt will perform moderately complex auditory comprehension tasks with 80% accuracy to reduce communication breakdowns.   Baseline:  Goal status: INITIAL  3.  With Min A, patient will describe visual scenes using 3 or more sentences at 80% accuracy.   Baseline:  Goal status: INITIAL  4.  With min A, pt will repeat 4-5 word phrases with 80% accuracy.  Baseline:  Goal status: INITIAL    LONG TERM GOALS: Target date: 12 weeks  With Min A, patient/family will demonstrate  understanding of the following concepts: aphasia, spontaneous recovery, communication vs conversation, and strengths/strategies to promote success to increase patient's participation in medical care and ADLs/IADLS.   Baseline:  Goal status: INITIAL  2.  Pt will utilize compensatory strategies for anomia with no more than min assistance during informal conversational exchanges. Baseline:  Goal status: INITIAL  3.  Pt will report improved communication per PROM. Baseline:  Goal status: INITIAL    ASSESSMENT:  CLINICAL IMPRESSION: Patient is a 83 y.o. male who was seen today for speech/language treatment in setting of stroke. Initial ssessment completed via WAB-R and CPIB. Pt's severity rating was mild as indicated by an Aphasia Quotient of 84 (0-25=very severe, 26-50=severe, 51-75=moderate, 76 and above is mild). Pt's presentation is most consistent with Broca's subtype, characterized by impaired verbal expression, impaired auditory comprehension, and impaired repetition. Reading and writing TBA. Pt's verbal expression is characterized by non-fluent speech with semantic and phonemic paraphasias, significant wordfinding difficulty. Pt with concomittant apraxia of speech and mild dysarthria. Despite, pt's speech is largely intelligible. See details of today's tx session above. Recommend course of ST with emphasis on compensation and education.    OBJECTIVE IMPAIRMENTS include expressive language, receptive language, aphasia,  and apraxia. These impairments are limiting patient from effectively communicating at home and in community. Factors affecting potential to achieve goals and functional outcome are time post-onset. Patient will benefit from skilled SLP services to address above impairments and improve overall function.  REHAB POTENTIAL: Fair -Good  PLAN: SLP FREQUENCY: 1-2x/week  SLP DURATION: 12 weeks  PLANNED INTERVENTIONS: Cueing hierachy, Functional tasks, Multimodal communication  approach, SLP instruction and feedback, Compensatory strategies, Patient/family education, and 07492 Treatment of speech (30 or 45 min)     Delon Bangs, M.S., CCC-SLP Speech-Language Pathologist [place] [facility] [phone] [place])  [facility] Outpatient Rehabilitation at [facility] [address] [place], [place], [address] Phone: [phone]   Fax:  [phone]

## 2023-07-12 ENCOUNTER — Ambulatory Visit

## 2023-07-12 DIAGNOSIS — I69890 Apraxia following other cerebrovascular disease: Secondary | ICD-10-CM

## 2023-07-12 DIAGNOSIS — R4701 Aphasia: Secondary | ICD-10-CM | POA: Diagnosis not present

## 2023-07-12 NOTE — Therapy (Signed)
 OUTPATIENT SPEECH LANGUAGE PATHOLOGY APHASIA TREATMENT   Patient Name: Dylan Robles. MRN: [id number] DOB:27-May-1940, 83 y.o., male Today's Date: 07/12/2023  PCP: Leita Longs, FNP REFERRING PROVIDER: Jannett Fairly, MD   End of Session - 07/12/23 1528     Visit Number 6    Number of Visits 24    Date for SLP Re-Evaluation 09/18/23    SLP Start Time 1445    SLP Stop Time  1530    SLP Time Calculation (min) 45 min    Activity Tolerance Patient tolerated treatment well          Past Medical History:  Diagnosis Date   Acute lower UTI 09/20/2017   Bradycardia on ECG 07/31/2011   Coronary artery disease    hyperlipidemia   Decreased GFR 09/23/2019   Dehydration 09/21/2017   Diabetes mellitus    Diabetes mellitus without complication (HCC)    Hypercholesterolemia    Hypertension    Hypothyroidism    Left arm pain 01/29/2020   Paronychia of great toe of left foot 09/26/2018   Paronychia of great toe, left 09/26/2018   PERIPHERAL NEUROPATHY 01/13/2006   Qualifier: Diagnosis of  By: Tamra MD, Christine     Shortness of breath dyspnea    with exertion   Sleep apnea    Stroke (HCC) 08/10/2022   Vasovagal syncope    Weakness 09/20/2017   Past Surgical History:  Procedure Laterality Date   APPENDECTOMY     CARDIAC CATHETERIZATION N/A 06/16/2014   Procedure: Left Heart Cath;  Surgeon: Denyse DELENA Bathe, MD;  Location: [facility];  Service: Cardiovascular;  Laterality: N/A;   CIRCUMCISION, NON-NEWBORN     CYSTOSCOPY N/A 02/01/2023   Procedure: CYSTOSCOPY;  Surgeon: Sherrilee Belvie CROME, MD;  Location: [facility];  Service: Urology;  Laterality: N/A;   HERNIA REPAIR     LOWER EXTREMITY ANGIOGRAPHY Left 12/24/2018   Procedure: LOWER EXTREMITY ANGIOGRAPHY;  Surgeon: Jama Cordella MATSU, MD;  Location: [facility];  Service: Cardiovascular;  Laterality: Left;   THYROID  SURGERY     TRANSURETHRAL RESECTION OF PROSTATE N/A 02/01/2023   Procedure: TRANSURETHRAL  RESECTION OF THE PROSTATE (TURP);  Surgeon: Sherrilee Belvie CROME, MD;  Location: [facility];  Service: Urology;  Laterality: N/A;   Patient Active Problem List   Diagnosis Date Noted   Hematuria, gross 02/02/2023   Stroke (HCC) 10/27/2022   Bladder trabeculation 10/11/2022   TIA (transient ischemic attack) 08/20/2022   Type II diabetes mellitus with renal manifestations (HCC) 08/20/2022   Acute renal failure superimposed on stage 3a chronic kidney disease (HCC) 08/20/2022   Chronic diastolic CHF (congestive heart failure) (HCC) 08/20/2022   New onset atrial fibrillation (HCC) 08/20/2022   CAD (coronary artery disease) 08/20/2022   BPH (benign prostatic hyperplasia) 06/14/2022   Venous stasis dermatitis of right lower extremity 06/14/2022   SOB (shortness of breath) 04/18/2022   Bilateral lower extremity edema 02/15/2022   Overweight (BMI 25.0-29.9) 06/10/2021   Proctitis 05/17/2021   Stage 3a chronic kidney disease (HCC) 03/03/2020   Atherosclerosis of native arteries of extremity with intermittent claudication (HCC) 12/19/2018   Hyperlipidemia 12/19/2018   Diabetic neuropathy (HCC) 09/26/2018   Type 2 diabetes mellitus with vascular disease (HCC) 09/26/2018   Coronary artery disease of native artery of native heart with stable angina pectoris (HCC) 09/20/2017   Type 2 diabetes mellitus with hyperlipidemia (HCC) 09/16/2008   Hypothyroidism 03/29/2006   Essential hypertension 01/13/2006   PERIPHERAL VASCULAR DISEASE 01/13/2006  ONSET DATE: August 2024    REFERRING DIAG: CVA; slurred speech  THERAPY DIAG:  Aphasia  Apraxia following other cerebrovascular disease  Rationale for Evaluation and Treatment Rehabilitation  SUBJECTIVE:   SUBJECTIVE STATEMENT: Pt alert, pleasant, and cooperative.  Pt accompanied by: significant other  PERTINENT HISTORY & DIAGNOSTIC FINDINGS: Pt an 83 y/o male who presented to ED on 08/20/22 for R sided weakness and difficulty speaking. Per Neurology  note, he reports lingering word finding difficulty and slurred speech. MRI, 08/2022, showed small focus of acute ischemia at posterior left insula. PMH: CAD, HTN, HLD, dCHF, BPH, CKD-3A, peripheral neuropathy.   PAIN:  Are you having pain? No  FALLS: Has patient fallen in last 6 months?  No  LIVING ENVIRONMENT: Lives with: lives with spouse   PLOF:  Level of assistance: Needed assistance with IADLS Employment: Retired   PATIENT GOALS    to improve communication   OBJECTIVE:  TODAY'S TREATMENT:  Reviewed semantic features analysis. Pt endorsed using pausing, gestures, and SFA to repair communication breakdowns. Pt demo'd SFA with min cues during structured task. Pt completed divergent naming averaging 8 items per concrete category with min cues. Pt repeated 5-6 word phrases with >90 accuracy. Reviewed TalkPath Therapy - pt completed x2 sessions outside of tx this week. Pt to continue for HEP.     PATIENT EDUCATION: Education details: as above Person educated: Patient and Spouse Education method: Explanation Education comprehension: verbalized understanding and needs further education  HOME EXERCISE PROGRAM:   Talk[facility]    GOALS:  Goals reviewed with patient? Yes  SHORT TERM GOALS: Target date: 10 sessions  With Min A, patient will use a circumlocution strategy, writing, drawing, and/or gesturing to describe target words with 80% accuracy to improve word-finding and reduce communication breakdowns. Baseline: Goal status: INITIAL  2.   With Min A, pt will perform moderately complex auditory comprehension tasks with 80% accuracy to reduce communication breakdowns.   Baseline:  Goal status: INITIAL  3.  With Min A, patient will describe visual scenes using 3 or more sentences at 80% accuracy.   Baseline:  Goal status: INITIAL  4.  With min A, pt will repeat 4-5 word phrases with 80% accuracy.  Baseline:  Goal status: INITIAL    LONG TERM GOALS: Target date:  12 weeks  With Min A, patient/family will demonstrate understanding of the following concepts: aphasia, spontaneous recovery, communication vs conversation, and strengths/strategies to promote success to increase patient's participation in medical care and ADLs/IADLS.   Baseline:  Goal status: INITIAL  2.  Pt will utilize compensatory strategies for anomia with no more than min assistance during informal conversational exchanges. Baseline:  Goal status: INITIAL  3.  Pt will report improved communication per PROM. Baseline:  Goal status: INITIAL    ASSESSMENT:  CLINICAL IMPRESSION: Patient is a 83 y.o. male who was seen today for speech/language treatment in setting of stroke. Initial ssessment completed via WAB-R and CPIB. Pt's severity rating was mild as indicated by an Aphasia Quotient of 84 (0-25=very severe, 26-50=severe, 51-75=moderate, 76 and above is mild). Pt's presentation is most consistent with Broca's subtype, characterized by impaired verbal expression, impaired auditory comprehension, and impaired repetition. Reading and writing TBA. Pt's verbal expression is characterized by non-fluent speech with semantic and phonemic paraphasias, significant wordfinding difficulty. Pt with concomittant apraxia of speech and mild dysarthria. Despite, pt's speech is largely intelligible. See details of today's tx session above. Recommend course of ST with emphasis on compensation and education.  OBJECTIVE IMPAIRMENTS include expressive language, receptive language, aphasia, and apraxia. These impairments are limiting patient from effectively communicating at home and in community. Factors affecting potential to achieve goals and functional outcome are time post-onset. Patient will benefit from skilled SLP services to address above impairments and improve overall function.  REHAB POTENTIAL: Fair -Good  PLAN: SLP FREQUENCY: 1-2x/week  SLP DURATION: 12 weeks  PLANNED INTERVENTIONS:  Cueing hierachy, Functional tasks, Multimodal communication approach, SLP instruction and feedback, Compensatory strategies, Patient/family education, and [id number] Treatment of speech (30 or 45 min)     Delon Bangs, M.S., CCC-SLP Speech-Language Pathologist [facility] [facility] [phone] [place])  [facility] [address] [place], [place], [address] Phone: [phone]   Fax:  [phone]

## 2023-07-14 ENCOUNTER — Other Ambulatory Visit (HOSPITAL_COMMUNITY): Payer: Self-pay

## 2023-07-23 ENCOUNTER — Ambulatory Visit (INDEPENDENT_AMBULATORY_CARE_PROVIDER_SITE_OTHER): Admitting: Cardiovascular Disease

## 2023-07-23 ENCOUNTER — Other Ambulatory Visit (HOSPITAL_COMMUNITY): Payer: Self-pay

## 2023-07-23 ENCOUNTER — Encounter: Payer: Self-pay | Admitting: Cardiovascular Disease

## 2023-07-23 VITALS — BP 120/62 | HR 101 | Ht 78.0 in | Wt 242.8 lb

## 2023-07-23 DIAGNOSIS — E782 Mixed hyperlipidemia: Secondary | ICD-10-CM

## 2023-07-23 DIAGNOSIS — I5032 Chronic diastolic (congestive) heart failure: Secondary | ICD-10-CM | POA: Diagnosis not present

## 2023-07-23 DIAGNOSIS — E1122 Type 2 diabetes mellitus with diabetic chronic kidney disease: Secondary | ICD-10-CM

## 2023-07-23 DIAGNOSIS — Z8679 Personal history of other diseases of the circulatory system: Secondary | ICD-10-CM | POA: Diagnosis not present

## 2023-07-23 DIAGNOSIS — R0602 Shortness of breath: Secondary | ICD-10-CM | POA: Diagnosis not present

## 2023-07-23 DIAGNOSIS — Z0130 Encounter for examination of blood pressure without abnormal findings: Secondary | ICD-10-CM

## 2023-07-23 NOTE — Progress Notes (Signed)
 Cardiology Office Note   Date:  07/23/2023   ID:  Dylan Ku Narayan Scull., DOB 1940-12-01, MRN [id number]  PCP:  Bevely Doffing, FNP  Cardiologist:  Denyse Bathe, MD      History of Present Illness: Dylan Iodice. is a 83 y.o. male who presents for  Chief Complaint  Patient presents with   Follow-up    4 week follow up    Feeling good.      Past Medical History:  Diagnosis Date   Acute lower UTI 09/20/2017   Bradycardia on ECG 07/31/2011   Coronary artery disease    hyperlipidemia   Decreased GFR 09/23/2019   Dehydration 09/21/2017   Diabetes mellitus    Diabetes mellitus without complication (HCC)    Hypercholesterolemia    Hypertension    Hypothyroidism    Left arm pain 01/29/2020   Paronychia of great toe of left foot 09/26/2018   Paronychia of great toe, left 09/26/2018   PERIPHERAL NEUROPATHY 01/13/2006   Qualifier: Diagnosis of  By: Tamra MD, Christine     Shortness of breath dyspnea    with exertion   Sleep apnea    Stroke (HCC) 08/10/2022   Vasovagal syncope    Weakness 09/20/2017     Past Surgical History:  Procedure Laterality Date   APPENDECTOMY     CARDIAC CATHETERIZATION N/A 06/16/2014   Procedure: Left Heart Cath;  Surgeon: Denyse DELENA Bathe, MD;  Location: [facility];  Service: Cardiovascular;  Laterality: N/A;   CIRCUMCISION, NON-NEWBORN     CYSTOSCOPY N/A 02/01/2023   Procedure: CYSTOSCOPY;  Surgeon: Sherrilee Belvie CROME, MD;  Location: [facility];  Service: Urology;  Laterality: N/A;   HERNIA REPAIR     LOWER EXTREMITY ANGIOGRAPHY Left 12/24/2018   Procedure: LOWER EXTREMITY ANGIOGRAPHY;  Surgeon: Jama Cordella MATSU, MD;  Location: [facility];  Service: Cardiovascular;  Laterality: Left;   THYROID  SURGERY     TRANSURETHRAL RESECTION OF PROSTATE N/A 02/01/2023   Procedure: TRANSURETHRAL RESECTION OF THE PROSTATE (TURP);  Surgeon: Sherrilee Belvie CROME, MD;  Location: [facility];  Service: Urology;  Laterality: N/A;      Current Outpatient Medications  Medication Sig Dispense Refill   apixaban  (ELIQUIS ) 2.5 MG TABS tablet Take 1 tablet (2.5 mg total) by mouth 2 (two) times daily. 60 tablet 3   atorvastatin  (LIPITOR) 80 MG tablet Take 1 tablet (80 mg total) by mouth daily. 90 tablet 3   cetirizine  (ZYRTEC ) 5 MG tablet Take 1 tablet (5 mg total) by mouth daily. 90 tablet 3   Continuous Glucose Receiver (FREESTYLE LIBRE 3 READER) DEVI Patient is to use to receive his blood glucose readings daily as directed by provider. 1 each 0   Continuous Glucose Sensor (FREESTYLE LIBRE 3 PLUS SENSOR) MISC Change sensor every 15 days to monitor blood glucose readings as directed by provider. 6 each 3   ezetimibe  (ZETIA ) 10 MG tablet Take 1 tablet (10 mg total) by mouth daily. 90 tablet 3   Fish Oil-Vitamin D  1000-1000 MG-UNIT CAPS Take 1 capsule by mouth daily.     furosemide  (LASIX ) 40 MG tablet Take 1 tablet (40 mg total) by mouth daily. 30 tablet 11   gabapentin  (NEURONTIN ) 300 MG capsule Take 1 capsule (300 mg total) by mouth 3 (three) times daily. 270 capsule 1   insulin  aspart (NOVOLOG ) 100 UNIT/ML FlexPen Inject 10-16 Units into the skin 3 (three) times daily with meals. 30 mL 3   insulin  degludec (  TRESIBA  FLEXTOUCH) 100 UNIT/ML FlexTouch Pen Inject 25 Units into the skin at bedtime. 25 mL 3   isosorbide  mononitrate (IMDUR ) 60 MG 24 hr tablet Take 1 tablet (60 mg total) by mouth daily. 90 tablet 1   levothyroxine  (SYNTHROID ) 88 MCG tablet Take 1 tablet (88 mcg total) by mouth daily before breakfast. 90 tablet 3   metoprolol  succinate (TOPROL -XL) 25 MG 24 hr tablet Take 1 tablet (25 mg total) by mouth daily. 30 tablet 11   triamcinolone  cream (KENALOG ) 0.1 % apply cream externally to affected area twice dailiy. 15 g 1   No current facility-administered medications for this visit.    Allergies:   Plavix [clopidogrel]    Social History:   reports that he has quit smoking. He has never used smokeless tobacco. He  reports that he does not drink alcohol and does not use drugs.   Family History:  family history includes Cancer in his mother; Diabetes in his brother and father; Hypertension in his father.    ROS:     Review of Systems  Constitutional: Negative.   HENT: Negative.    Eyes: Negative.   Respiratory: Negative.    Gastrointestinal: Negative.   Genitourinary: Negative.   Musculoskeletal: Negative.   Skin: Negative.   Neurological: Negative.   Endo/Heme/Allergies: Negative.   Psychiatric/Behavioral: Negative.    All other systems reviewed and are negative.     All other systems are reviewed and negative.    PHYSICAL EXAM: VS:  BP 120/62   Pulse (!) 101   Ht 6' 6 (1.981 m)   Wt 242 lb 12.8 oz (110.1 kg)   SpO2 96%   BMI 28.06 kg/m  , BMI Body mass index is 28.06 kg/m. Last weight:  Wt Readings from Last 3 Encounters:  07/23/23 242 lb 12.8 oz (110.1 kg)  06/19/23 239 lb (108.4 kg)  06/08/23 241 lb (109.3 kg)     Physical Exam Vitals reviewed.  Constitutional:      Appearance: Normal appearance. He is normal weight.  HENT:     Head: Normocephalic.     Nose: Nose normal.     Mouth/Throat:     Mouth: Mucous membranes are moist.  Eyes:     Pupils: Pupils are equal, round, and reactive to light.  Cardiovascular:     Rate and Rhythm: Normal rate and regular rhythm.     Pulses: Normal pulses.     Heart sounds: Normal heart sounds.  Pulmonary:     Effort: Pulmonary effort is normal.  Abdominal:     General: Abdomen is flat. Bowel sounds are normal.  Musculoskeletal:        General: Normal range of motion.     Cervical back: Normal range of motion.  Skin:    General: Skin is warm.  Neurological:     General: No focal deficit present.     Mental Status: He is alert.  Psychiatric:        Mood and Affect: Mood normal.       EKG:   Recent Labs: 08/20/2022: B Natriuretic Peptide 439.8 02/02/2023: Hemoglobin 10.3; Platelets 152 04/20/2023: ALT 26; BUN 27;  Creatinine, Ser 1.92; Potassium 4.3; Sodium 139; TSH 2.680    Lipid Panel    Component Value Date/Time   CHOL 126 08/21/2022 0502   CHOL 125 05/10/2022 0917   TRIG 74 08/21/2022 0502   HDL 34 (L) 08/21/2022 0502   HDL 46 05/10/2022 0917   CHOLHDL 3.7 08/21/2022 0502  VLDL 15 08/21/2022 0502   LDLCALC 77 08/21/2022 0502   LDLCALC 65 05/10/2022 0917   LDLCALC 73 11/03/2020 1032      Other studies Reviewed: Additional studies/ records that were reviewed today include:  Review of the above records demonstrates:       No data to display            ASSESSMENT AND PLAN:    ICD-10-CM   1. SOB (shortness of breath)  R06.02    Improved SOB    2. Atrial fibrillation, currently in sinus rhythm  Z86.79     3. Mixed hyperlipidemia  E78.2     4. Chronic diastolic CHF (congestive heart failure) (HCC)  I50.32    Continue GDMT       Problem List Items Addressed This Visit       Cardiovascular and Mediastinum   Chronic diastolic CHF (congestive heart failure) (HCC)     Other   Hyperlipidemia   SOB (shortness of breath) - Primary   Other Visit Diagnoses       Atrial fibrillation, currently in sinus rhythm              Disposition:   Return in about 3 months (around 10/23/2023).    Total time spent: 30 minutes  Signed,  Denyse Bathe, MD  07/23/2023 11:40 AM    [facility]

## 2023-07-26 ENCOUNTER — Ambulatory Visit: Admitting: Podiatry

## 2023-07-26 DIAGNOSIS — M79675 Pain in left toe(s): Secondary | ICD-10-CM | POA: Diagnosis not present

## 2023-07-26 DIAGNOSIS — M79674 Pain in right toe(s): Secondary | ICD-10-CM | POA: Diagnosis not present

## 2023-07-26 DIAGNOSIS — E1142 Type 2 diabetes mellitus with diabetic polyneuropathy: Secondary | ICD-10-CM | POA: Diagnosis not present

## 2023-07-26 DIAGNOSIS — B351 Tinea unguium: Secondary | ICD-10-CM

## 2023-07-26 DIAGNOSIS — I739 Peripheral vascular disease, unspecified: Secondary | ICD-10-CM | POA: Diagnosis not present

## 2023-08-01 ENCOUNTER — Ambulatory Visit: Admitting: Nurse Practitioner

## 2023-08-01 ENCOUNTER — Other Ambulatory Visit (HOSPITAL_COMMUNITY): Payer: Self-pay

## 2023-08-01 ENCOUNTER — Encounter: Payer: Self-pay | Admitting: Podiatry

## 2023-08-01 ENCOUNTER — Other Ambulatory Visit: Payer: Self-pay

## 2023-08-01 ENCOUNTER — Encounter: Payer: Self-pay | Admitting: Nurse Practitioner

## 2023-08-01 VITALS — BP 116/76 | HR 73 | Ht 78.0 in | Wt 242.8 lb

## 2023-08-01 DIAGNOSIS — E1122 Type 2 diabetes mellitus with diabetic chronic kidney disease: Secondary | ICD-10-CM

## 2023-08-01 DIAGNOSIS — Z794 Long term (current) use of insulin: Secondary | ICD-10-CM

## 2023-08-01 DIAGNOSIS — E039 Hypothyroidism, unspecified: Secondary | ICD-10-CM | POA: Diagnosis not present

## 2023-08-01 DIAGNOSIS — N1832 Chronic kidney disease, stage 3b: Secondary | ICD-10-CM | POA: Diagnosis not present

## 2023-08-01 LAB — POCT GLYCOSYLATED HEMOGLOBIN (HGB A1C): Hemoglobin A1C: 10.1 % — AB (ref 4.0–5.6)

## 2023-08-01 MED ORDER — INSULIN ASPART 100 UNIT/ML FLEXPEN
8.0000 [IU] | PEN_INJECTOR | Freq: Three times a day (TID) | SUBCUTANEOUS | 3 refills | Status: DC
  Filled 2023-08-01: qty 36, 85d supply, fill #0
  Filled 2023-10-20: qty 36, 85d supply, fill #1

## 2023-08-01 NOTE — Progress Notes (Signed)
 Endocrinology Follow Up Note       08/01/2023, 12:02 PM   Subjective:    Patient ID: Dylan Rosina Glendia Mickey., male    DOB: Jun 01, 1940.  Dylan Rosina Glendia Mickey. is being seen in follow up after being seen in consultation for management of currently uncontrolled symptomatic diabetes requested by  Bevely Doffing, FNP.   Past Medical History:  Diagnosis Date   Acute lower UTI 09/20/2017   Bradycardia on ECG 07/31/2011   Coronary artery disease    hyperlipidemia   Decreased GFR 09/23/2019   Dehydration 09/21/2017   Diabetes mellitus    Diabetes mellitus without complication (HCC)    Hypercholesterolemia    Hypertension    Hypothyroidism    Left arm pain 01/29/2020   Paronychia of great toe of left foot 09/26/2018   Paronychia of great toe, left 09/26/2018   PERIPHERAL NEUROPATHY 01/13/2006   Qualifier: Diagnosis of  By: Tamra MD, Christine     Shortness of breath dyspnea    with exertion   Sleep apnea    Stroke (HCC) 08/10/2022   Vasovagal syncope    Weakness 09/20/2017    Past Surgical History:  Procedure Laterality Date   APPENDECTOMY     CARDIAC CATHETERIZATION N/A 06/16/2014   Procedure: Left Heart Cath;  Surgeon: Denyse DELENA Bathe, MD;  Location: [facility];  Service: Cardiovascular;  Laterality: N/A;   CIRCUMCISION, NON-NEWBORN     CYSTOSCOPY N/A 02/01/2023   Procedure: CYSTOSCOPY;  Surgeon: Sherrilee Belvie CROME, MD;  Location: [facility];  Service: Urology;  Laterality: N/A;   HERNIA REPAIR     LOWER EXTREMITY ANGIOGRAPHY Left 12/24/2018   Procedure: LOWER EXTREMITY ANGIOGRAPHY;  Surgeon: Jama Cordella MATSU, MD;  Location: [facility];  Service: Cardiovascular;  Laterality: Left;   THYROID  SURGERY     TRANSURETHRAL RESECTION OF PROSTATE N/A 02/01/2023   Procedure: TRANSURETHRAL RESECTION OF THE PROSTATE (TURP);  Surgeon: Sherrilee Belvie CROME, MD;  Location: [facility];  Service: Urology;   Laterality: N/A;    Social History   Socioeconomic History   Marital status: Married    Spouse name: Not on file   Number of children: Not on file   Years of education: Not on file   Highest education level: Not on file  Occupational History   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   Overall Financial Resource Strain (CARDIA)    Difficulty of Paying Living Expenses: Not hard at all  Food Insecurity: No Food Insecurity (06/13/2023)   Received from [facility]   Hunger Vital Sign    Within the past 12 months, you worried that  your food would run out before you got the money to buy more.: Never true    Within the past 12 months, the food you bought just didn't last and you didn't have money to get more.: Never true  Transportation Needs: No Transportation Needs (06/13/2023)   Received from [facility] - [facility]    In the past 12 months, has lack of transportation kept you from medical appointments or from getting medications?: No    Lack of Transportation (Non-Medical): No  Physical Activity: Insufficiently Active (04/17/2023)   Exercise Vital Sign    Days of Exercise per Week: 7 days    Minutes of Exercise per Session: 20 min  Stress: No Stress Concern Present (04/17/2023)   [employer] of Occupational Health - Occupational Stress Questionnaire    Feeling of Stress : Not at all  Social Connections: Moderately Isolated (04/17/2023)   Social Connection and Isolation Panel    Frequency of  Communication with Friends and Family: More than three times a week    Frequency of Social Gatherings with Friends and Family: More than three times a week    Attends Religious Services: Never    Database administrator or Organizations: No    Attends Banker Meetings: Never    Marital Status: Married    Family History  Problem Relation Age of Onset   Diabetes Father    Hypertension Father    Cancer Mother    Diabetes Brother     Outpatient Encounter Medications as of 08/01/2023  Medication Sig   apixaban  (ELIQUIS ) 2.5 MG TABS tablet Take 1 tablet (2.5 mg total) by mouth 2 (two) times daily.   atorvastatin  (LIPITOR) 80 MG tablet Take 1 tablet (80 mg total) by mouth daily.   cetirizine  (ZYRTEC ) 5 MG tablet Take 1 tablet (5 mg total) by mouth daily.   Continuous Glucose Receiver (FREESTYLE LIBRE 3 READER) DEVI Patient is to use to receive his blood glucose readings daily as directed by provider.   Continuous Glucose Sensor (FREESTYLE LIBRE 3 PLUS SENSOR) MISC Change sensor every 15 days to monitor blood glucose readings as directed by provider.   ezetimibe  (ZETIA ) 10 MG tablet Take 1 tablet (10 mg total) by mouth daily.   Fish Oil-Vitamin D  1000-1000 MG-UNIT CAPS Take 1 capsule by mouth daily.   furosemide  (LASIX ) 40 MG tablet Take 1 tablet (40 mg total) by mouth daily.   gabapentin  (NEURONTIN ) 300 MG capsule Take 1 capsule (300 mg total) by mouth 3 (three) times daily.   insulin  degludec (TRESIBA  FLEXTOUCH) 100 UNIT/ML FlexTouch Pen Inject 25 Units into the skin at bedtime.   isosorbide  mononitrate (IMDUR ) 60 MG 24 hr tablet Take 1 tablet (60 mg total) by mouth daily.   levothyroxine  (SYNTHROID ) 88 MCG tablet Take 1 tablet (88 mcg total) by mouth daily before breakfast.   metoprolol  succinate (TOPROL -XL) 25 MG 24 hr tablet Take 1 tablet (25 mg total) by mouth daily.   [DISCONTINUED] insulin  aspart (NOVOLOG ) 100 UNIT/ML FlexPen Inject 10-16 Units into the skin 3 (three) times  daily with meals. (Patient taking differently: Inject 5-11 Units into the skin 3 (three) times daily with meals.)   insulin  aspart (NOVOLOG ) 100 UNIT/ML FlexPen Inject 8-14 Units into the skin 3 (three) times daily with meals.   triamcinolone  cream (KENALOG ) 0.1 % apply cream externally to affected area twice dailiy.   No facility-administered encounter medications on file as of 08/01/2023.    ALLERGIES: Allergies  Allergen Reactions   Plavix [Clopidogrel] Hives    VACCINATION STATUS: Immunization History  Administered Date(s) Administered   Fluad Quad(high Dose 65+) 09/22/2019, 11/03/2020, 11/10/2021   Fluad Trivalent(High Dose 65+) 12/04/2022   Influenza Whole 10/05/2005, 12/17/2006   Influenza,inj,quad, With Preservative 11/25/2018   Moderna Sars-Covid-2 Vaccination 03/06/2019, 04/03/2019, 01/06/2020   Pneumococcal Conjugate-13 01/29/2020   Pneumococcal Polysaccharide-23 08/21/2005   Td 12/17/2006   Tdap 06/10/2021   Zoster Recombinant(Shingrix) 06/03/2021, 09/20/2021    Diabetes He presents for his follow-up diabetic visit. He has type 2 diabetes mellitus. Onset time: Diagnosed at approx age of 35. His disease course has been improving. There are no hypoglycemic associated symptoms. Associated symptoms include foot paresthesias. There are no hypoglycemic complications. Symptoms are stable. Diabetic complications include heart disease, nephropathy, peripheral neuropathy and PVD. Risk factors for coronary artery disease include diabetes mellitus, dyslipidemia, family history, male sex and hypertension. Current diabetic treatment includes intensive insulin  program. He is compliant with treatment most of the time. His weight is fluctuating minimally. He is following a generally healthy diet. Meal planning includes avoidance of concentrated sweets. He has had a previous visit with a dietitian. He participates in exercise intermittently. His home blood glucose trend is fluctuating  dramatically. His overall blood glucose range is >200 mg/dl. (He presents today, accompanied by his wife, with his CGM showing drastically fluctuating glycemic profile.  His POCT A1c today is 11.3%, increasing from last visit of 10.2%.  Analysis of his CGM shows TIR 35%, TAR 56%, TBR 0% with a GMI of 8.4%.  He has been taking his insulin  more consistently.) An ACE inhibitor/angiotensin II receptor blocker is being taken. He sees a podiatrist.Eye exam is current.     Review of systems  Constitutional: + Minimally fluctuating body weight, current Body mass index is 28.06 kg/m., no fatigue, no subjective hyperthermia, no subjective hypothermia Eyes: no blurry vision, no xerophthalmia ENT: no sore throat, no nodules palpated in throat, no dysphagia/odynophagia, no hoarseness Cardiovascular: no chest pain, no shortness of breath, no palpitations, no leg swelling Respiratory: no cough, no shortness of breath Gastrointestinal: no nausea/vomiting/diarrhea Musculoskeletal: no muscle/joint aches, walks with cane/walker Skin: no rashes, no hyperemia Neurological: no tremors, + numbness/tingling to BLE, no dizziness Psychiatric: no depression, no anxiety   Objective:     BP 116/76 (BP Location: Right Arm, Patient Position: Standing, Cuff Size: Large)   Pulse 73   Ht 6' 6 (1.981 m)   Wt 242 lb 12.8 oz (110.1 kg)   BMI 28.06 kg/m   Wt Readings from Last 3 Encounters:  08/01/23 242 lb 12.8 oz (110.1 kg)  07/23/23 242 lb 12.8 oz (110.1 kg)  06/19/23 239 lb (108.4 kg)     BP Readings from Last 3 Encounters:  08/01/23 116/76  07/23/23 120/62  06/19/23 (!) 100/58     Physical Exam- Limited  Constitutional:  Body mass index is 28.06 kg/m. , not in acute distress, normal state of mind Eyes:  EOMI, no exophthalmos Musculoskeletal: no gross deformities, strength intact in all four extremities, no gross restriction of joint movements, walks with cane/walker Skin:  no rashes, no  hyperemia Neurological: no tremor with outstretched hands    CMP ( most recent) CMP     Component Value Date/Time   NA 139 04/20/2023 1022   K 4.3 04/20/2023 1022   CL 101 04/20/2023 1022   CO2 22 04/20/2023 1022   GLUCOSE 173 (H) 04/20/2023 1022   GLUCOSE 219 (H) 02/02/2023 0418   BUN 27  04/20/2023 1022   CREATININE 1.92 (H) 04/20/2023 1022   CREATININE 1.34 (H) 02/02/2021 0839   CALCIUM  10.1 04/20/2023 1022   PROT 6.4 04/20/2023 1022   ALBUMIN 4.1 04/20/2023 1022   AST 20 04/20/2023 1022   ALT 26 04/20/2023 1022   ALKPHOS 163 (H) 04/20/2023 1022   BILITOT 0.8 04/20/2023 1022   GFRNONAA 27 (L) 02/03/2023 0533   GFRNONAA 54 (L) 04/29/2020 1141   GFRAA 63 04/29/2020 1141     Diabetic Labs (most recent): Lab Results  Component Value Date   HGBA1C 10.1 (A) 08/01/2023   HGBA1C 11.3 (A) 04/26/2023   HGBA1C 11.3 (A) 04/26/2023   MICROALBUR 5.0 02/02/2021   MICROALBUR 1.4 09/22/2019   MICROALBUR 0.67 12/17/2006     Lipid Panel ( most recent) Lipid Panel     Component Value Date/Time   CHOL 126 08/21/2022 0502   CHOL 125 05/10/2022 0917   TRIG 74 08/21/2022 0502   HDL 34 (L) 08/21/2022 0502   HDL 46 05/10/2022 0917   CHOLHDL 3.7 08/21/2022 0502   VLDL 15 08/21/2022 0502   LDLCALC 77 08/21/2022 0502   LDLCALC 65 05/10/2022 0917   LDLCALC 73 11/03/2020 1032   LABVLDL 14 05/10/2022 0917      Lab Results  Component Value Date   TSH 2.680 04/20/2023   TSH 1.510 08/20/2022   TSH 1.730 05/10/2022   TSH 2.140 12/23/2021   TSH 1.790 06/14/2021   TSH 1.45 11/03/2020   TSH 1.75 03/31/2020   TSH 1.17 09/22/2019   TSH 0.94 03/19/2019   TSH 1.039 09/20/2017   FREET4 1.36 04/20/2023   FREET4 1.45 05/10/2022   FREET4 1.41 12/23/2021   FREET4 1.3 09/22/2019   FREET4 1.3 03/19/2019   FREET4 1.32 07/31/2011   FREET4 1.09 06/17/2008   FREET4 1.31 12/18/2007   FREET4 1.27 05/10/2006           Assessment & Plan:   1) Type 2 diabetes mellitus with stage 3a  chronic kidney disease, with long-term current use of insulin  (HCC)  He presents today, accompanied by his wife, with his CGM showing drastically fluctuating glycemic profile.  His POCT A1c today is 11.3%, increasing from last visit of 10.2%.  Analysis of his CGM shows TIR 35%, TAR 56%, TBR 0% with a GMI of 8.4%.  He has been taking his insulin  more consistently.  - Dylan Rosina Glendia Mickey. has currently uncontrolled symptomatic type 2 DM since 83 years of age.   -Recent labs reviewed.  - I had a long discussion with him about the progressive nature of diabetes and the pathology behind its complications. -his diabetes is complicated by CAD, CKD, PVD, neuropathy and he remains at a high risk for more acute and chronic complications which include CAD, CVA, CKD, retinopathy, and neuropathy. These are all discussed in detail with him.  The following Lifestyle Medicine recommendations according to American College of Lifestyle Medicine [facility]) were discussed and offered to patient and he agrees to start the journey:  A. Whole Foods, Plant-based plate comprising of fruits and vegetables, plant-based proteins, whole-grain carbohydrates was discussed in detail with the patient.   A list for source of those nutrients were also provided to the patient.  Patient will use only water  or unsweetened tea for hydration. B.  The need to stay away from risky substances including alcohol, smoking; obtaining 7 to 9 hours of restorative sleep, at least 150 minutes of moderate intensity exercise weekly, the importance of healthy social connections,  and stress reduction techniques were discussed. C.  A full color page of  Calorie density of various food groups per pound showing examples of each food groups was provided to the patient.  - Nutritional counseling repeated at each appointment due to patients tendency to fall back in to old habits.  - The patient admits there is a room for improvement in their diet and drink  choices. -  Suggestion is made for the patient to avoid simple carbohydrates from their diet including Cakes, Sweet Desserts / Pastries, Ice Cream, Soda (diet and regular), Sweet Tea, Candies, Chips, Cookies, Sweet Pastries, Store Bought Juices, Alcohol in Excess of 1-2 drinks a day, Artificial Sweeteners, Coffee Creamer, and Sugar-free Products. This will help patient to have stable blood glucose profile and potentially avoid unintended weight gain.   - I encouraged the patient to switch to unprocessed or minimally processed complex starch and increased protein intake (animal or plant source), fruits, and vegetables.   - Patient is advised to stick to a routine mealtimes to eat 3 meals a day and avoid unnecessary snacks (to snack only to correct hypoglycemia).  - I have approached him with the following individualized plan to manage his diabetes and patient agrees:   -He is advised to continue his Tresiba  25 units SQ nightly and increase Novolog  to 8-14 units TID with meals if glucose is above 90 and he is eating (Specific instructions on how to titrate insulin  dosage based on glucose readings given to patient in writing)- but to be more consistent with it.    -he is encouraged to continue monitoring glucose 4 times daily (using his CGM), before meals and before bed, and to call the clinic if he has readings less than 70 or above 300 for 3 tests in a row.   - he is warned not to take insulin  without proper monitoring per orders. - Adjustment parameters are given to him for hypo and hyperglycemia in writing.  - his Glipizide  was be discontinued, risk outweighs benefit for this patient- risk of hypoglycemia given advanced age and CKD. - he is not a candidate for full dose Metformin  due to concurrent renal insufficiency, I discontinued this on previously to de-escalate his treatment and preserve kidney function.  - he is not an ideal candidate for incretin therapy due to body habitus with BMI at  25.  -I would not consider him to be a great candidate for SGLT2i despite his CHF history as it may increase his risk of falls (due to polyuria), UTI, and yeast formation.  - Specific targets for  A1c; LDL, HDL, and Triglycerides were discussed with the patient.  2) Blood Pressure /Hypertension:  his blood pressure is controlled to target for his age.   he is advised to continue his current medications including Norvasc  5 mg p.o. daily with breakfast, Lasix  20 mg po daily as needed, Lisinopril  20 mg po daily and Metoprolol  50 mg po daily.  3) Lipids/Hyperlipidemia:    Review of his recent lipid panel from 05/10/22 showed controlled LDL at 65.  he is advised to continue Lipitor 80 mg daily at bedtime and Zetia  10 mg po daily.  Side effects and precautions discussed with him.    4)  Weight/Diet:  his Body mass index is 28.06 kg/m.  -  he is NOT a candidate for weight loss.  Exercise, and detailed carbohydrates information provided  -  detailed on discharge instructions.  5) Hypothyroidism-unspecified The details surrounding his diagnosis are not available.  There are no recent TFTs to review.  He is advised to continue his Levothyroxine  88 mcg po daily before breakfast.  Will recheck TFTs prior to next visit.   - The correct intake of thyroid  hormone (Levothyroxine , Synthroid ), is on empty stomach first thing in the morning, with water , separated by at least 30 minutes from breakfast and other medications,  and separated by more than 4 hours from calcium , iron, multivitamins, acid reflux medications (PPIs).  - This medication is a life-long medication and will be needed to correct thyroid  hormone imbalances for the rest of your life.  The dose may change from time to time, based on thyroid  blood work.  - It is extremely important to be consistent taking this medication, near the same time each morning.  -AVOID TAKING PRODUCTS CONTAINING BIOTIN (commonly found in Hair, Skin, Nails vitamins)  AS IT INTERFERES WITH THE VALIDITY OF THYROID  FUNCTION BLOOD TESTS.  6) Vitamin D  deficiency His most recent vitamin D  level from 05/10/22 was slightly low at 28.8.  He is already taking supplement but not sure of the amount.  I advised to take Vitamin d3 5000 units daily.  7) Chronic Care/Health Maintenance: -he is on ACEI/ARB and Statin medications and is encouraged to initiate and continue to follow up with Ophthalmology, Dentist, Podiatrist at least yearly or according to recommendations, and advised to stay away from smoking. I have recommended yearly flu vaccine and pneumonia vaccine at least every 5 years; moderate intensity exercise for up to 150 minutes weekly; and sleep for at least 7 hours a day.  - he is advised to maintain close follow up with Bevely Doffing, FNP for primary care needs, as well as his other providers for optimal and coordinated care.     I spent  31  minutes in the care of the patient today including review of labs from CMP, Lipids, Thyroid  Function, Hematology (current and previous including abstractions from other facilities); face-to-face time discussing  his blood glucose readings/logs, discussing hypoglycemia and hyperglycemia episodes and symptoms, medications doses, his options of short and long term treatment based on the latest standards of care / guidelines;  discussion about incorporating lifestyle medicine;  and documenting the encounter. Risk reduction counseling performed per USPSTF guidelines to reduce obesity and cardiovascular risk factors.     Please refer to Patient Instructions for Blood Glucose Monitoring and Insulin /Medications Dosing Guide  in media tab for additional information. Please  also refer to  Patient Self Inventory in the Media  tab for reviewed elements of pertinent patient history.  Dylan Ku Queets. participated in the discussions, expressed understanding, and voiced agreement with the above plans.  All questions were  answered to his satisfaction. he is encouraged to contact clinic should he have any questions or concerns prior to his return visit.     Follow up plan: - Return in about 3 months (around 11/01/2023) for Diabetes F/U with A1c in office, Thyroid  follow up, Previsit labs, Bring meter and logs.  Benton Rio, [facility] [address] [place], [place] [address] Phone: [phone] Fax: [phone]  08/01/2023, 12:02 PM

## 2023-08-01 NOTE — Progress Notes (Signed)
  Subjective:  Patient ID: Dylan Robles., male    DOB: December 30, 1940,  MRN: [id number]  Dylan Robles. presents to clinic today for at risk footcare. Patient has h/o diabetes, neuropathy and PAD and is seen for  and painful thick toenails that are difficult to trim. Pain interferes with ambulation. Aggravating factors include wearing enclosed shoe gear. Pain is relieved with periodic professional debridement.  Chief Complaint  Patient presents with   Nail Problem    Thick painful toenails, 3 month follow up. Patient is taking Eliquis     Diabetes   New problem(s): None.   PCP is Bevely Doffing, FNP.  Allergies  Allergen Reactions   Plavix [Clopidogrel] Hives    Review of Systems: Negative except as noted in the HPI.  Objective: No changes noted in today's physical examination. There were no vitals filed for this visit. Dylan Robles. is a pleasant 83 y.o. male WD, WN in NAD. AAO x 3.  Vascular Examination: CFT <4 seconds b/l. DP pulses diminished b/l. Trace edema noted BLE.PT pulses diminished b/l. Digital hair absent. Skin temperature gradient warm to cool b/l. No ischemia or gangrene. No cyanosis or clubbing noted b/l.    Neurological Examination: Protective sensation diminished with 10g monofilament b/l.  Dermatological Examination: Pedal skin thin, shiny and atrophic b/l. No open wounds. No interdigital macerations.   Toenails 1-5 b/l thick, discolored, elongated with subungual debris and pain on dorsal palpation.   No hyperkeratotic nor porokeratotic lesions present on today's visit.  Musculoskeletal Examination: Muscle strength 5/5 to all lower extremity muscle groups bilaterally. No pain, crepitus or joint limitation noted with ROM b/l LE. Hammertoe(s) bilateral 2nd toes, bilateral 3rd toes, and bilateral 4th toes.. Patient ambulates with walker assistance.  Radiographs: None  Last A1c:      Latest Ref Rng & Units 04/26/2023   11:46 AM  04/26/2023   11:35 AM 01/26/2023   10:42 AM 08/21/2022    5:02 AM  Hemoglobin A1C  Hemoglobin-A1c 4.0 - 5.6 % 11.3  11.3  10.2  10.1    Assessment/Plan: 1. Pain due to onychomycosis of toenails of both feet   2. PAD (peripheral artery disease) (HCC)   3. Diabetic polyneuropathy associated with type 2 diabetes mellitus (HCC)     Patient was evaluated and treated. All patient's and/or POA's questions/concerns addressed on today's visit. Mycotic toenails 1-5 debrided in length and girth without incident.  Continue daily foot inspections and monitor blood glucose per PCP/Endocrinologist's recommendations.Continue soft, supportive shoe gear daily. Report any pedal injuries to medical professional. Call office if there are any quesitons/concerns. -Patient/POA to call should there be question/concern in the interim.   Return in about 3 months (around 10/26/2023).  Delon LITTIE Merlin, DPM      [place] LOCATION: [address], [place] [address]                   Office [phone]   [facility] LOCATION: [address] [place], [place] [address] Office [phone]

## 2023-08-03 ENCOUNTER — Other Ambulatory Visit (HOSPITAL_COMMUNITY): Payer: Self-pay

## 2023-08-08 ENCOUNTER — Encounter

## 2023-08-13 ENCOUNTER — Ambulatory Visit: Attending: Neurology

## 2023-08-13 ENCOUNTER — Other Ambulatory Visit (HOSPITAL_COMMUNITY): Payer: Self-pay

## 2023-08-13 DIAGNOSIS — I69890 Apraxia following other cerebrovascular disease: Secondary | ICD-10-CM | POA: Insufficient documentation

## 2023-08-13 DIAGNOSIS — R4701 Aphasia: Secondary | ICD-10-CM | POA: Insufficient documentation

## 2023-08-13 MED FILL — Cetirizine HCl Tab 5 MG: ORAL | 90 days supply | Qty: 90 | Fill #2 | Status: AC

## 2023-08-13 MED FILL — Atorvastatin Calcium Tab 80 MG (Base Equivalent): ORAL | 90 days supply | Qty: 90 | Fill #2 | Status: AC

## 2023-08-13 MED FILL — Ezetimibe Tab 10 MG: ORAL | 90 days supply | Qty: 90 | Fill #2 | Status: AC

## 2023-08-13 NOTE — Therapy (Signed)
 OUTPATIENT SPEECH LANGUAGE PATHOLOGY APHASIA DISCHARGE   Patient Name: Dylan Robles. MRN: [id number] DOB:20-Jul-1940, 83 y.o., male Today's Date: 08/13/2023  PCP: Leita Longs, FNP REFERRING PROVIDER: Jannett Fairly, MD   End of Session - 08/13/23 1451     Visit Number 7    Number of Visits 24    Date for SLP Re-Evaluation 09/18/23    Progress Note Due on Visit 10    SLP Start Time 1450    SLP Stop Time  1500    SLP Time Calculation (min) 10 min    Activity Tolerance --   pt self d/c'd         Past Medical History:  Diagnosis Date   Acute lower UTI 09/20/2017   Bradycardia on ECG 07/31/2011   Coronary artery disease    hyperlipidemia   Decreased GFR 09/23/2019   Dehydration 09/21/2017   Diabetes mellitus    Diabetes mellitus without complication (HCC)    Hypercholesterolemia    Hypertension    Hypothyroidism    Left arm pain 01/29/2020   Paronychia of great toe of left foot 09/26/2018   Paronychia of great toe, left 09/26/2018   PERIPHERAL NEUROPATHY 01/13/2006   Qualifier: Diagnosis of  By: Tamra MD, Christine     Shortness of breath dyspnea    with exertion   Sleep apnea    Stroke (HCC) 08/10/2022   Vasovagal syncope    Weakness 09/20/2017   Past Surgical History:  Procedure Laterality Date   APPENDECTOMY     CARDIAC CATHETERIZATION N/A 06/16/2014   Procedure: Left Heart Cath;  Surgeon: Denyse DELENA Bathe, MD;  Location: [facility];  Service: Cardiovascular;  Laterality: N/A;   CIRCUMCISION, NON-NEWBORN     CYSTOSCOPY N/A 02/01/2023   Procedure: CYSTOSCOPY;  Surgeon: Sherrilee Belvie CROME, MD;  Location: [facility];  Service: Urology;  Laterality: N/A;   HERNIA REPAIR     LOWER EXTREMITY ANGIOGRAPHY Left 12/24/2018   Procedure: LOWER EXTREMITY ANGIOGRAPHY;  Surgeon: Jama Cordella MATSU, MD;  Location: [facility];  Service: Cardiovascular;  Laterality: Left;   THYROID  SURGERY     TRANSURETHRAL RESECTION OF PROSTATE N/A 02/01/2023   Procedure:  TRANSURETHRAL RESECTION OF THE PROSTATE (TURP);  Surgeon: Sherrilee Belvie CROME, MD;  Location: [facility];  Service: Urology;  Laterality: N/A;   Patient Active Problem List   Diagnosis Date Noted   Hematuria, gross 02/02/2023   Stroke (HCC) 10/27/2022   Bladder trabeculation 10/11/2022   TIA (transient ischemic attack) 08/20/2022   Type II diabetes mellitus with renal manifestations (HCC) 08/20/2022   Acute renal failure superimposed on stage 3a chronic kidney disease (HCC) 08/20/2022   Chronic diastolic CHF (congestive heart failure) (HCC) 08/20/2022   New onset atrial fibrillation (HCC) 08/20/2022   CAD (coronary artery disease) 08/20/2022   BPH (benign prostatic hyperplasia) 06/14/2022   Venous stasis dermatitis of right lower extremity 06/14/2022   SOB (shortness of breath) 04/18/2022   Bilateral lower extremity edema 02/15/2022   Overweight (BMI 25.0-29.9) 06/10/2021   Proctitis 05/17/2021   Stage 3a chronic kidney disease (HCC) 03/03/2020   Atherosclerosis of native arteries of extremity with intermittent claudication (HCC) 12/19/2018   Hyperlipidemia 12/19/2018   Diabetic neuropathy (HCC) 09/26/2018   Type 2 diabetes mellitus with vascular disease (HCC) 09/26/2018   Coronary artery disease of native artery of native heart with stable angina pectoris (HCC) 09/20/2017   Type 2 diabetes mellitus with hyperlipidemia (HCC) 09/16/2008   Hypothyroidism 03/29/2006   Essential  hypertension 01/13/2006   PERIPHERAL VASCULAR DISEASE 01/13/2006    ONSET DATE: August 2024    REFERRING DIAG: CVA; slurred speech  THERAPY DIAG:  Aphasia  Apraxia following other cerebrovascular disease  Rationale for Evaluation and Treatment Rehabilitation  SUBJECTIVE:   SUBJECTIVE STATEMENT: Pt alert, pleasant, and cooperative.  Pt accompanied by: significant other  PERTINENT HISTORY & DIAGNOSTIC FINDINGS: Pt an 83 y/o male who presented to ED on 08/20/22 for R sided weakness and difficulty speaking.  Per Neurology note, he reports lingering word finding difficulty and slurred speech. MRI, 08/2022, showed small focus of acute ischemia at posterior left insula. PMH: CAD, HTN, HLD, dCHF, BPH, CKD-3A, peripheral neuropathy.   PAIN:  Are you having pain? No  FALLS: Has patient fallen in last 6 months?  No  LIVING ENVIRONMENT: Lives with: lives with spouse   PLOF:  Level of assistance: Needed assistance with IADLS Employment: Retired   PATIENT GOALS    to improve communication   OBJECTIVE:  TODAY'S TREATMENT:  N/A     PATIENT EDUCATION: Education details: plan to d/c Person educated: Patient and Spouse Education method: Explanation Education comprehension: verbalized understanding   HOME EXERCISE PROGRAM:   TalkPath    GOALS:  Goals reviewed with patient? Yes  SHORT TERM GOALS: Target date: 10 sessions  With Min A, patient will use a circumlocution strategy, writing, drawing, and/or gesturing to describe target words with 80% accuracy to improve word-finding and reduce communication breakdowns. Baseline: Goal status: MET  2.   With Min A, pt will perform moderately complex auditory comprehension tasks with 80% accuracy to reduce communication breakdowns.   Baseline:  Goal status: NOT MET; making progress  3.  With Min A, patient will describe visual scenes using 3 or more sentences at 80% accuracy.   Baseline:  Goal status: NOT MET; making progress  4.  With min A, pt will repeat 4-5 word phrases with 80% accuracy.  Baseline:  Goal status: MET    LONG TERM GOALS: Target date: 12 weeks  With Min A, patient/family will demonstrate understanding of the following concepts: aphasia, spontaneous recovery, communication vs conversation, and strengths/strategies to promote success to increase patient's participation in medical care and ADLs/IADLS.   Baseline:  Goal status: MET  2.  Pt will utilize compensatory strategies for anomia with no more than min  assistance during informal conversational exchanges. Baseline:  Goal status: MET  3.  Pt will report improved communication per PROM. Baseline:  Goal status: NOT MET; however, reported subjective improvement    ASSESSMENT:  CLINICAL IMPRESSION: Patient is a 83 y.o. male who was seen today for speech/language treatment in setting of stroke. Pt continues with residual anomia and higher level auditory comprehension deficits. Pt and wife endorse marked improving in wordfinding. Pt expressed that he would like to be d/c'd from ST at this time.    PLAN: D/C SLP SERVICES PER PT REQUEST    Delon Bangs, M.S., CCC-SLP Speech-Language Pathologist [facility] [phone] [place])  [place] [facility] [address] [place], [place], [address] Phone: [phone]   Fax:  [phone]

## 2023-08-15 ENCOUNTER — Ambulatory Visit

## 2023-08-20 ENCOUNTER — Ambulatory Visit

## 2023-08-21 ENCOUNTER — Other Ambulatory Visit (HOSPITAL_COMMUNITY): Payer: Self-pay

## 2023-08-22 ENCOUNTER — Encounter

## 2023-08-27 ENCOUNTER — Encounter

## 2023-08-29 ENCOUNTER — Encounter

## 2023-08-31 ENCOUNTER — Other Ambulatory Visit: Payer: Self-pay | Admitting: Cardiovascular Disease

## 2023-08-31 ENCOUNTER — Other Ambulatory Visit (HOSPITAL_COMMUNITY): Payer: Self-pay

## 2023-08-31 MED ORDER — APIXABAN 2.5 MG PO TABS
2.5000 mg | ORAL_TABLET | Freq: Two times a day (BID) | ORAL | 3 refills | Status: DC
  Filled 2023-08-31: qty 60, 30d supply, fill #0
  Filled 2023-09-28: qty 60, 30d supply, fill #1
  Filled 2023-10-27: qty 60, 30d supply, fill #2
  Filled 2023-11-24: qty 60, 30d supply, fill #3

## 2023-09-03 ENCOUNTER — Encounter

## 2023-09-04 ENCOUNTER — Other Ambulatory Visit: Payer: Self-pay | Admitting: Internal Medicine

## 2023-09-04 ENCOUNTER — Other Ambulatory Visit (HOSPITAL_COMMUNITY): Payer: Self-pay

## 2023-09-04 DIAGNOSIS — I10 Essential (primary) hypertension: Secondary | ICD-10-CM | POA: Diagnosis not present

## 2023-09-04 DIAGNOSIS — N189 Chronic kidney disease, unspecified: Secondary | ICD-10-CM | POA: Diagnosis not present

## 2023-09-04 DIAGNOSIS — D631 Anemia in chronic kidney disease: Secondary | ICD-10-CM | POA: Diagnosis not present

## 2023-09-04 DIAGNOSIS — R809 Proteinuria, unspecified: Secondary | ICD-10-CM | POA: Diagnosis not present

## 2023-09-04 MED ORDER — ISOSORBIDE MONONITRATE ER 60 MG PO TB24
60.0000 mg | ORAL_TABLET | Freq: Every day | ORAL | 1 refills | Status: AC
  Filled 2023-09-04: qty 90, 90d supply, fill #0
  Filled 2023-12-05: qty 90, 90d supply, fill #1

## 2023-09-05 ENCOUNTER — Encounter

## 2023-09-10 DIAGNOSIS — N1832 Chronic kidney disease, stage 3b: Secondary | ICD-10-CM | POA: Diagnosis not present

## 2023-09-10 DIAGNOSIS — I5032 Chronic diastolic (congestive) heart failure: Secondary | ICD-10-CM | POA: Diagnosis not present

## 2023-09-10 DIAGNOSIS — N2581 Secondary hyperparathyroidism of renal origin: Secondary | ICD-10-CM | POA: Diagnosis not present

## 2023-09-10 DIAGNOSIS — E1129 Type 2 diabetes mellitus with other diabetic kidney complication: Secondary | ICD-10-CM | POA: Diagnosis not present

## 2023-09-11 ENCOUNTER — Encounter

## 2023-09-11 ENCOUNTER — Ambulatory Visit

## 2023-09-11 VITALS — BP 125/77 | HR 82 | Ht 78.0 in | Wt 239.0 lb

## 2023-09-11 DIAGNOSIS — E1159 Type 2 diabetes mellitus with other circulatory complications: Secondary | ICD-10-CM | POA: Diagnosis not present

## 2023-09-11 NOTE — Progress Notes (Signed)
 Established Patient Office Visit  Subjective   Patient ID: Dylan Hayter., male    DOB: 1940-04-02  Age: 83 y.o. MRN: [id number]  Chief Complaint  Patient presents with   Medical Management of Chronic Issues    3 month follow up    HPI  Discussed the use of AI scribe software for clinical note transcription with the patient, who gave verbal consent to proceed.  History of Present Illness   Dylan Gesner Cayson Kalb. is an 83 year old male who presents for a routine follow-up visit. He is accompanied by his wife, Dylan Robles.  Chronic disease management - Under care of endocrinologist for diabetes management - Followed by cardiologist and neurologist for ongoing conditions - No current concerns or need for medication refills; specialists are managing prescriptions  Ophthalmologic health - Last eye exam in May 2024 at [facility] - Overdue for annual eye examination - No current visual complaints      Patient Active Problem List   Diagnosis Date Noted   Hematuria, gross 02/02/2023   Stroke (HCC) 10/27/2022   Bladder trabeculation 10/11/2022   TIA (transient ischemic attack) 08/20/2022   Type II diabetes mellitus with renal manifestations (HCC) 08/20/2022   Acute renal failure superimposed on stage 3a chronic kidney disease (HCC) 08/20/2022   Chronic diastolic CHF (congestive heart failure) (HCC) 08/20/2022   New onset atrial fibrillation (HCC) 08/20/2022   CAD (coronary artery disease) 08/20/2022   BPH (benign prostatic hyperplasia) 06/14/2022   Venous stasis dermatitis of right lower extremity 06/14/2022   SOB (shortness of breath) 04/18/2022   Bilateral lower extremity edema 02/15/2022   Overweight (BMI 25.0-29.9) 06/10/2021   Proctitis 05/17/2021   Stage 3a chronic kidney disease (HCC) 03/03/2020   Atherosclerosis of native arteries of extremity with intermittent claudication (HCC) 12/19/2018   Hyperlipidemia 12/19/2018   Diabetic neuropathy (HCC)  09/26/2018   Type 2 diabetes mellitus with vascular disease (HCC) 09/26/2018   Coronary artery disease of native artery of native heart with stable angina pectoris (HCC) 09/20/2017   Type 2 diabetes mellitus with hyperlipidemia (HCC) 09/16/2008   Hypothyroidism 03/29/2006   Essential hypertension 01/13/2006   PERIPHERAL VASCULAR DISEASE 01/13/2006    ROS    Objective:     BP 125/77   Pulse 82   Ht 6' 6 (1.981 m)   Wt 239 lb (108.4 kg)   SpO2 94%   BMI 27.62 kg/m  BP Readings from Last 3 Encounters:  09/11/23 125/77  08/01/23 116/76  07/23/23 120/62   Wt Readings from Last 3 Encounters:  09/11/23 239 lb (108.4 kg)  08/01/23 242 lb 12.8 oz (110.1 kg)  07/23/23 242 lb 12.8 oz (110.1 kg)     Physical Exam Vitals and nursing note reviewed. Exam conducted with a chaperone present (wife is with him today).  Constitutional:      Appearance: Normal appearance.  HENT:     Head: Normocephalic.     Right Ear: Tympanic membrane, ear canal and external ear normal.     Left Ear: Tympanic membrane, ear canal and external ear normal.     Nose: Nose normal.     Mouth/Throat:     Mouth: Mucous membranes are moist.     Pharynx: Oropharynx is clear.  Eyes:     Extraocular Movements: Extraocular movements intact.     Pupils: Pupils are equal, round, and reactive to light.  Cardiovascular:     Rate and Rhythm: Normal rate and regular rhythm.  Pulmonary:  Effort: Pulmonary effort is normal.     Breath sounds: Normal breath sounds.  Musculoskeletal:     Cervical back: Normal range of motion and neck supple.  Neurological:     Mental Status: He is alert and oriented to person, place, and time.     Gait: Gait abnormal (ambulates with walker).  Psychiatric:        Mood and Affect: Mood normal.        Thought Content: Thought content normal.     No results found for any visits on 09/11/23.    The ASCVD Risk score (Arnett DK, et al., 2019) failed to calculate for the  following reasons:   The 2019 ASCVD risk score is only valid for ages 51 to 48   Risk score cannot be calculated because patient has a medical history suggesting prior/existing ASCVD    Assessment & Plan:   Problem List Items Addressed This Visit       Cardiovascular and Mediastinum   Type 2 diabetes mellitus with vascular disease (HCC) - Primary   This is being managed by endocrinology.  Discussed need to update diabetic eye exam.  His last eye exam was May 2024.  He was scheduled as soon.       Return in about 6 months (around 03/10/2024) for chronic follow-up with PCP.    Leita Longs, FNP

## 2023-09-11 NOTE — Assessment & Plan Note (Signed)
 This is being managed by endocrinology.  Discussed need to update diabetic eye exam.  His last eye exam was May 2024.  He was scheduled as soon.

## 2023-09-13 ENCOUNTER — Encounter

## 2023-09-17 ENCOUNTER — Other Ambulatory Visit: Payer: Self-pay

## 2023-09-17 ENCOUNTER — Encounter

## 2023-09-19 ENCOUNTER — Encounter

## 2023-09-27 ENCOUNTER — Ambulatory Visit (INDEPENDENT_AMBULATORY_CARE_PROVIDER_SITE_OTHER)

## 2023-09-27 ENCOUNTER — Encounter (INDEPENDENT_AMBULATORY_CARE_PROVIDER_SITE_OTHER): Payer: Self-pay | Admitting: Nurse Practitioner

## 2023-09-27 ENCOUNTER — Ambulatory Visit (INDEPENDENT_AMBULATORY_CARE_PROVIDER_SITE_OTHER): Admitting: Nurse Practitioner

## 2023-09-27 ENCOUNTER — Other Ambulatory Visit (INDEPENDENT_AMBULATORY_CARE_PROVIDER_SITE_OTHER)

## 2023-09-27 VITALS — BP 162/77 | HR 61 | Ht 78.0 in | Wt 240.0 lb

## 2023-09-27 DIAGNOSIS — I70213 Atherosclerosis of native arteries of extremities with intermittent claudication, bilateral legs: Secondary | ICD-10-CM

## 2023-09-27 DIAGNOSIS — I10 Essential (primary) hypertension: Secondary | ICD-10-CM

## 2023-09-27 DIAGNOSIS — E1159 Type 2 diabetes mellitus with other circulatory complications: Secondary | ICD-10-CM

## 2023-09-28 ENCOUNTER — Other Ambulatory Visit (HOSPITAL_COMMUNITY): Payer: Self-pay

## 2023-09-28 LAB — VAS US ABI WITH/WO TBI
Left ABI: 0.74
Right ABI: 0.62

## 2023-10-01 ENCOUNTER — Encounter (INDEPENDENT_AMBULATORY_CARE_PROVIDER_SITE_OTHER): Payer: Self-pay | Admitting: Nurse Practitioner

## 2023-10-01 NOTE — Progress Notes (Signed)
 Subjective:    Patient ID: Dylan Rosina Glendia Mickey., male    DOB: September 05, 1940, 83 y.o.   MRN: [id number] Chief Complaint  Patient presents with   Follow-up     fu 6 months + ABI + Bil Art SUplex see JD/FB  fu 6 months + ABI + Bil Art SUplex see JD/FB   fu 6 months + ABI + Bil Art SUplex see JD/FB       The patient returns to the office for followup regarding atherosclerotic changes of the lower extremities and review of the noninvasive studies.   There have been no interval changes in lower extremity symptoms. No interval shortening of the patient's claudication distance or development of rest pain symptoms. No new ulcers or wounds have occurred since the last visit.  There have been no significant changes to the patient's overall health care.   The patient denies amaurosis fugax or recent TIA symptoms. There are no documented recent neurological changes noted. There is no history of DVT, PE or superficial thrombophlebitis. The patient denies recent episodes of angina or shortness of breath.   ABI Rt=0.62 and Lt=0.74  (previous ABI's Rt=0.82 and Lt=0.90) Patient had bilateral arterial duplexes done which show primarily triphasic/biphasic waveforms into the level of the tibial vessels.    Review of Systems  Neurological:  Positive for numbness.  All other systems reviewed and are negative.      Objective:   Physical Exam Vitals reviewed.  HENT:     Head: Normocephalic.  Cardiovascular:     Rate and Rhythm: Normal rate.  Pulmonary:     Effort: Pulmonary effort is normal.  Skin:    General: Skin is warm and dry.  Neurological:     Mental Status: He is alert and oriented to person, place, and time.  Psychiatric:        Mood and Affect: Mood normal.        Behavior: Behavior normal.        Thought Content: Thought content normal.        Judgment: Judgment normal.     BP (!) 162/77   Pulse 61   Ht 6' 6 (1.981 m)   Wt 240 lb (108.9 kg)   BMI 27.73 kg/m   Past  Medical History:  Diagnosis Date   Acute lower UTI 09/20/2017   Bradycardia on ECG 07/31/2011   Coronary artery disease    hyperlipidemia   Decreased GFR 09/23/2019   Dehydration 09/21/2017   Diabetes mellitus    Diabetes mellitus without complication (HCC)    Hypercholesterolemia    Hypertension    Hypothyroidism    Left arm pain 01/29/2020   Paronychia of great toe of left foot 09/26/2018   Paronychia of great toe, left 09/26/2018   PERIPHERAL NEUROPATHY 01/13/2006   Qualifier: Diagnosis of  By: Tamra MD, Christine     Shortness of breath dyspnea    with exertion   Sleep apnea    Stroke (HCC) 08/10/2022   Vasovagal syncope    Weakness 09/20/2017    Social History   Socioeconomic History   Marital status: Married    Spouse name: Not on file   Number of children: Not on file   Years of education: Not on file   Highest education level: Not on file  Occupational History   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  Tobacco Use   Smoking status: Former   Smokeless tobacco: Never  Vaping Use   Vaping status:  Never Used  Substance and Sexual Activity   Alcohol use: No   Drug use: No   Sexual activity: Not on file  Other Topics Concern   Not on file  Social History Narrative   ** Merged History Encounter **       Social Drivers of Health   Financial Resource Strain: Low Risk  (06/13/2023)   Received from [facility]   Overall Financial Resource Strain (CARDIA)    Difficulty of Paying Living Expenses: Not hard at all  Food Insecurity: No Food Insecurity (06/13/2023)   Received from [facility]   Hunger Vital Sign    Within the past 12 months, you worried that your food would run out before you got the money to buy more.: Never true    Within the past 12 months, the food you bought just didn't last and you didn't have money to get more.: Never true  Transportation Needs: No Transportation Needs (06/13/2023)   Received from [facility] - Transportation    In the past 12 months, has lack of transportation kept you from medical appointments or from getting medications?: No    Lack of Transportation (Non-Medical): No  Physical Activity: Insufficiently Active (04/17/2023)   Exercise Vital Sign    Days of Exercise per Week: 7 days    Minutes of Exercise per Session: 20 min  Stress: No Stress Concern Present (04/17/2023)   [employer] of Occupational Health - Occupational Stress Questionnaire    Feeling of Stress : Not at all  Social Connections: Moderately Isolated (04/17/2023)   Social Connection and Isolation Panel    Frequency of Communication with Friends and Family: More than three times a week    Frequency of Social Gatherings with Friends and Family: More than three times a week    Attends Religious Services: Never    Database administrator or Organizations: No    Attends Banker Meetings: Never    Marital Status: Married  Catering manager Violence: Not At Risk (04/17/2023)   Humiliation, Afraid, Rape, and Kick questionnaire    Fear of Current or Ex-Partner: No    Emotionally Abused: No    Physically Abused: No    Sexually Abused: No    Past Surgical History:  Procedure Laterality Date   APPENDECTOMY     CARDIAC CATHETERIZATION N/A 06/16/2014   Procedure: Left Heart Cath;  Surgeon: Denyse DELENA Bathe, MD;  Location: [facility];  Service: Cardiovascular;  Laterality: N/A;   CIRCUMCISION, NON-NEWBORN     CYSTOSCOPY N/A 02/01/2023   Procedure: CYSTOSCOPY;  Surgeon: Sherrilee Belvie CROME, MD;  Location: [facility];  Service: Urology;  Laterality: N/A;   HERNIA REPAIR     LOWER EXTREMITY ANGIOGRAPHY Left 12/24/2018   Procedure: LOWER EXTREMITY ANGIOGRAPHY;  Surgeon: Jama Cordella MATSU, MD;  Location: [facility];  Service: Cardiovascular;  Laterality: Left;   THYROID  SURGERY     TRANSURETHRAL RESECTION OF PROSTATE N/A 02/01/2023   Procedure: TRANSURETHRAL  RESECTION OF THE PROSTATE (TURP);  Surgeon: Sherrilee Belvie CROME, MD;  Location: [facility];  Service: Urology;  Laterality: N/A;    Family History  Problem Relation Age of Onset   Diabetes Father    Hypertension Father    Cancer Mother    Diabetes Brother     Allergies  Allergen Reactions   Plavix [Clopidogrel] Hives       Latest Ref Rng & Units 02/02/2023  4:18 AM 02/01/2023   11:09 AM 01/30/2023    1:24 PM  CBC  WBC 4.0 - 10.5 K/uL 16.6  7.1  6.9   Hemoglobin 13.0 - 17.0 g/dL 89.6  89.0  88.3   Hematocrit 39.0 - 52.0 % 32.2  34.6  36.8   Platelets 150 - 400 K/uL 152  177  207       CMP     Component Value Date/Time   NA 139 04/20/2023 1022   K 4.3 04/20/2023 1022   CL 101 04/20/2023 1022   CO2 22 04/20/2023 1022   GLUCOSE 173 (H) 04/20/2023 1022   GLUCOSE 219 (H) 02/02/2023 0418   BUN 27 04/20/2023 1022   CREATININE 1.92 (H) 04/20/2023 1022   CREATININE 1.34 (H) 02/02/2021 0839   CALCIUM  10.1 04/20/2023 1022   PROT 6.4 04/20/2023 1022   ALBUMIN 4.1 04/20/2023 1022   AST 20 04/20/2023 1022   ALT 26 04/20/2023 1022   ALKPHOS 163 (H) 04/20/2023 1022   BILITOT 0.8 04/20/2023 1022   EGFR 34 (L) 04/20/2023 1022   GFRNONAA 27 (L) 02/03/2023 0533   GFRNONAA 54 (L) 04/29/2020 1141     No results found.     Assessment & Plan:   1. Atherosclerosis of native artery of both lower extremities with intermittent claudication (HCC) (Primary)  Recommend:  The patient has evidence of atherosclerosis of the lower extremities with claudication.  The patient does not voice lifestyle limiting changes at this point in time.  We discussed the changes in ABIs but because he does not have any worsening symptoms he does not wish to undergo intervention at this time which is reasonable.  Noninvasive studies do not suggest clinically significant change.  No invasive studies, angiography or surgery at this time The patient should continue walking and begin a more formal exercise  program.  The patient should continue antiplatelet therapy and aggressive treatment of the lipid abnormalities  No changes in the patient's medications at this time  Continued surveillance is indicated as atherosclerosis is likely to progress with time.    The patient will continue follow up with noninvasive studies as ordered.   The patient has had some slightly worsening discomfort so we will have him follow-up a little bit sooner noninvasive studies - VAS US  ABI WITH/WO TBI; Future - VAS US  LOWER EXTREMITY ARTERIAL DUPLEX; Future  2. Essential hypertension Continue antihypertensive medications as already ordered, these medications have been reviewed and there are no changes at this time.  3. Type 2 diabetes mellitus with vascular disease (HCC) Continue hypoglycemic medications as already ordered, these medications have been reviewed and there are no changes at this time.  Hgb A1C to be monitored as already arranged by primary service   Current Outpatient Medications on File Prior to Visit  Medication Sig Dispense Refill   apixaban  (ELIQUIS ) 2.5 MG TABS tablet Take 1 tablet (2.5 mg total) by mouth 2 (two) times daily. 60 tablet 3   atorvastatin  (LIPITOR) 80 MG tablet Take 1 tablet (80 mg total) by mouth daily. 90 tablet 3   cetirizine  (ZYRTEC ) 5 MG tablet Take 1 tablet (5 mg total) by mouth daily. 90 tablet 3   Continuous Glucose Receiver (FREESTYLE LIBRE 3 READER) DEVI Patient is to use to receive his blood glucose readings daily as directed by provider. 1 each 0   Continuous Glucose Sensor (FREESTYLE LIBRE 3 PLUS SENSOR) MISC Change sensor every 15 days to monitor blood glucose readings as directed by provider. 6  each 3   ezetimibe  (ZETIA ) 10 MG tablet Take 1 tablet (10 mg total) by mouth daily. 90 tablet 3   Fish Oil-Vitamin D  1000-1000 MG-UNIT CAPS Take 1 capsule by mouth daily.     furosemide  (LASIX ) 40 MG tablet Take 1 tablet (40 mg total) by mouth daily. 30 tablet 11    gabapentin  (NEURONTIN ) 300 MG capsule Take 1 capsule (300 mg total) by mouth 3 (three) times daily. 270 capsule 1   insulin  aspart (NOVOLOG ) 100 UNIT/ML FlexPen Inject 8-14 Units into the skin 3 (three) times daily with meals. 36 mL 3   insulin  degludec (TRESIBA  FLEXTOUCH) 100 UNIT/ML FlexTouch Pen Inject 25 Units into the skin at bedtime. 25 mL 3   isosorbide  mononitrate (IMDUR ) 60 MG 24 hr tablet Take 1 tablet (60 mg total) by mouth daily. 90 tablet 1   levothyroxine  (SYNTHROID ) 88 MCG tablet Take 1 tablet (88 mcg total) by mouth daily before breakfast. 90 tablet 3   metoprolol  succinate (TOPROL -XL) 25 MG 24 hr tablet Take 1 tablet (25 mg total) by mouth daily. 30 tablet 11   triamcinolone  cream (KENALOG ) 0.1 % apply cream externally to affected area twice dailiy. 15 g 1   No current facility-administered medications on file prior to visit.    There are no Patient Instructions on file for this visit. No follow-ups on file.   Winnona Wargo E Graeden Bitner, NP

## 2023-10-08 ENCOUNTER — Other Ambulatory Visit: Payer: Self-pay

## 2023-10-11 ENCOUNTER — Other Ambulatory Visit (HOSPITAL_COMMUNITY): Payer: Self-pay

## 2023-10-11 ENCOUNTER — Other Ambulatory Visit: Payer: Self-pay | Admitting: Cardiovascular Disease

## 2023-10-12 ENCOUNTER — Other Ambulatory Visit: Payer: Self-pay

## 2023-10-12 ENCOUNTER — Other Ambulatory Visit (HOSPITAL_COMMUNITY): Payer: Self-pay

## 2023-10-12 MED ORDER — METOPROLOL SUCCINATE ER 25 MG PO TB24
25.0000 mg | ORAL_TABLET | Freq: Every day | ORAL | 11 refills | Status: AC
  Filled 2023-10-12: qty 30, 30d supply, fill #0
  Filled 2023-11-14: qty 30, 30d supply, fill #1
  Filled 2023-12-11: qty 30, 30d supply, fill #2
  Filled 2024-01-09: qty 30, 30d supply, fill #3
  Filled 2024-02-08: qty 90, 90d supply, fill #4

## 2023-10-15 ENCOUNTER — Other Ambulatory Visit: Payer: Self-pay | Admitting: Nurse Practitioner

## 2023-10-15 DIAGNOSIS — E1142 Type 2 diabetes mellitus with diabetic polyneuropathy: Secondary | ICD-10-CM

## 2023-10-16 ENCOUNTER — Other Ambulatory Visit (HOSPITAL_COMMUNITY): Payer: Self-pay

## 2023-10-16 ENCOUNTER — Other Ambulatory Visit: Payer: Self-pay

## 2023-10-17 ENCOUNTER — Other Ambulatory Visit (HOSPITAL_COMMUNITY): Payer: Self-pay

## 2023-10-17 MED ORDER — GABAPENTIN 300 MG PO CAPS
300.0000 mg | ORAL_CAPSULE | Freq: Three times a day (TID) | ORAL | 1 refills | Status: AC
  Filled 2023-10-17 – 2023-10-19 (×2): qty 270, 90d supply, fill #0
  Filled 2023-12-24 – 2024-01-15 (×2): qty 270, 90d supply, fill #1

## 2023-10-19 ENCOUNTER — Other Ambulatory Visit: Payer: Self-pay

## 2023-10-19 ENCOUNTER — Other Ambulatory Visit (HOSPITAL_COMMUNITY): Payer: Self-pay

## 2023-10-20 ENCOUNTER — Other Ambulatory Visit (HOSPITAL_COMMUNITY): Payer: Self-pay

## 2023-10-23 ENCOUNTER — Ambulatory Visit: Admitting: Cardiovascular Disease

## 2023-10-23 ENCOUNTER — Encounter: Payer: Self-pay | Admitting: Cardiovascular Disease

## 2023-10-23 VITALS — BP 126/64 | HR 75 | Ht 78.0 in | Wt 241.4 lb

## 2023-10-23 DIAGNOSIS — I25118 Atherosclerotic heart disease of native coronary artery with other forms of angina pectoris: Secondary | ICD-10-CM | POA: Diagnosis not present

## 2023-10-23 DIAGNOSIS — I5032 Chronic diastolic (congestive) heart failure: Secondary | ICD-10-CM | POA: Diagnosis not present

## 2023-10-23 DIAGNOSIS — I771 Stricture of artery: Secondary | ICD-10-CM | POA: Diagnosis not present

## 2023-10-23 DIAGNOSIS — I4891 Unspecified atrial fibrillation: Secondary | ICD-10-CM

## 2023-10-23 DIAGNOSIS — E782 Mixed hyperlipidemia: Secondary | ICD-10-CM

## 2023-10-23 DIAGNOSIS — I739 Peripheral vascular disease, unspecified: Secondary | ICD-10-CM | POA: Diagnosis not present

## 2023-10-23 DIAGNOSIS — I639 Cerebral infarction, unspecified: Secondary | ICD-10-CM | POA: Diagnosis not present

## 2023-10-23 DIAGNOSIS — Z0130 Encounter for examination of blood pressure without abnormal findings: Secondary | ICD-10-CM

## 2023-10-23 DIAGNOSIS — E1165 Type 2 diabetes mellitus with hyperglycemia: Secondary | ICD-10-CM | POA: Diagnosis not present

## 2023-10-23 NOTE — Progress Notes (Signed)
 Cardiology Office Note   Date:  10/23/2023   ID:  Dylan Robles Dylan Robles., DOB 03/19/1940, MRN [id number]  PCP:  Bevely Doffing, FNP  Cardiologist:  Denyse Bathe, MD      History of Present Illness: Dylan Robles. is a 83 y.o. male who presents for  Chief Complaint  Patient presents with   Follow-up    3 month follow up    Feeling fine.      Past Medical History:  Diagnosis Date   Acute lower UTI 09/20/2017   Bradycardia on ECG 07/31/2011   Coronary artery disease    hyperlipidemia   Decreased GFR 09/23/2019   Dehydration 09/21/2017   Diabetes mellitus    Diabetes mellitus without complication (HCC)    Hypercholesterolemia    Hypertension    Hypothyroidism    Left arm pain 01/29/2020   Paronychia of great toe of left foot 09/26/2018   Paronychia of great toe, left 09/26/2018   PERIPHERAL NEUROPATHY 01/13/2006   Qualifier: Diagnosis of  By: Tamra MD, Christine     Shortness of breath dyspnea    with exertion   Sleep apnea    Stroke (HCC) 08/10/2022   Vasovagal syncope    Weakness 09/20/2017     Past Surgical History:  Procedure Laterality Date   APPENDECTOMY     CARDIAC CATHETERIZATION N/A 06/16/2014   Procedure: Left Heart Cath;  Surgeon: Denyse DELENA Bathe, MD;  Location: [facility];  Service: Cardiovascular;  Laterality: N/A;   CIRCUMCISION, NON-NEWBORN     CYSTOSCOPY N/A 02/01/2023   Procedure: CYSTOSCOPY;  Surgeon: Sherrilee Belvie CROME, MD;  Location: [facility];  Service: Urology;  Laterality: N/A;   HERNIA REPAIR     LOWER EXTREMITY ANGIOGRAPHY Left 12/24/2018   Procedure: LOWER EXTREMITY ANGIOGRAPHY;  Surgeon: Jama Cordella MATSU, MD;  Location: [facility];  Service: Cardiovascular;  Laterality: Left;   THYROID  SURGERY     TRANSURETHRAL RESECTION OF PROSTATE N/A 02/01/2023   Procedure: TRANSURETHRAL RESECTION OF THE PROSTATE (TURP);  Surgeon: Sherrilee Belvie CROME, MD;  Location: [facility];  Service: Urology;  Laterality: N/A;      Current Outpatient Medications  Medication Sig Dispense Refill   apixaban  (ELIQUIS ) 2.5 MG TABS tablet Take 1 tablet (2.5 mg total) by mouth 2 (two) times daily. 60 tablet 3   atorvastatin  (LIPITOR) 80 MG tablet Take 1 tablet (80 mg total) by mouth daily. 90 tablet 3   cetirizine  (ZYRTEC ) 5 MG tablet Take 1 tablet (5 mg total) by mouth daily. 90 tablet 3   Continuous Glucose Receiver (FREESTYLE LIBRE 3 READER) DEVI Patient is to use to receive his blood glucose readings daily as directed by provider. 1 each 0   Continuous Glucose Sensor (FREESTYLE LIBRE 3 PLUS SENSOR) MISC Change sensor every 15 days to monitor blood glucose readings as directed by provider. 6 each 3   ezetimibe  (ZETIA ) 10 MG tablet Take 1 tablet (10 mg total) by mouth daily. 90 tablet 3   Fish Oil-Vitamin D  1000-1000 MG-UNIT CAPS Take 1 capsule by mouth daily.     furosemide  (LASIX ) 40 MG tablet Take 1 tablet (40 mg total) by mouth daily. 30 tablet 11   gabapentin  (NEURONTIN ) 300 MG capsule Take 1 capsule (300 mg total) by mouth 3 (three) times daily. 270 capsule 1   insulin  aspart (NOVOLOG ) 100 UNIT/ML FlexPen Inject 8-14 Units into the skin 3 (three) times daily with meals. 36 mL 3   insulin  degludec (  TRESIBA  FLEXTOUCH) 100 UNIT/ML FlexTouch Pen Inject 25 Units into the skin at bedtime. 25 mL 3   isosorbide  mononitrate (IMDUR ) 60 MG 24 hr tablet Take 1 tablet (60 mg total) by mouth daily. 90 tablet 1   levothyroxine  (SYNTHROID ) 88 MCG tablet Take 1 tablet (88 mcg total) by mouth daily before breakfast. 90 tablet 3   metoprolol  succinate (TOPROL -XL) 25 MG 24 hr tablet Take 1 tablet (25 mg total) by mouth daily. 30 tablet 11   triamcinolone  cream (KENALOG ) 0.1 % apply cream externally to affected area twice dailiy. (Patient not taking: Reported on 10/23/2023) 15 g 1   No current facility-administered medications for this visit.    Allergies:   Plavix [clopidogrel]    Social History:   reports that he has quit  smoking. He has never used smokeless tobacco. He reports that he does not drink alcohol and does not use drugs.   Family History:  family history includes Cancer in his mother; Diabetes in his brother and father; Hypertension in his father.    ROS:     Review of Systems  Constitutional: Negative.   HENT: Negative.    Eyes: Negative.   Respiratory: Negative.    Gastrointestinal: Negative.   Genitourinary: Negative.   Musculoskeletal: Negative.   Skin: Negative.   Neurological: Negative.   Endo/Heme/Allergies: Negative.   Psychiatric/Behavioral: Negative.    All other systems reviewed and are negative.     All other systems are reviewed and negative.    PHYSICAL EXAM: VS:  BP 126/64   Pulse 75   Ht 6' 6 (1.981 m)   Wt 241 lb 6.4 oz (109.5 kg)   SpO2 96%   BMI 27.90 kg/m  , BMI Body mass index is 27.9 kg/m. Last weight:  Wt Readings from Last 3 Encounters:  10/23/23 241 lb 6.4 oz (109.5 kg)  09/27/23 240 lb (108.9 kg)  09/11/23 239 lb (108.4 kg)     Physical Exam Vitals reviewed.  Constitutional:      Appearance: Normal appearance. He is normal weight.  HENT:     Head: Normocephalic.     Nose: Nose normal.     Mouth/Throat:     Mouth: Mucous membranes are moist.  Eyes:     Pupils: Pupils are equal, round, and reactive to light.  Cardiovascular:     Rate and Rhythm: Normal rate and regular rhythm.     Pulses: Normal pulses.     Heart sounds: Normal heart sounds.  Pulmonary:     Effort: Pulmonary effort is normal.  Abdominal:     General: Abdomen is flat. Bowel sounds are normal.  Musculoskeletal:        General: Normal range of motion.     Cervical back: Normal range of motion.  Skin:    General: Skin is warm.  Neurological:     General: No focal deficit present.     Mental Status: He is alert.  Psychiatric:        Mood and Affect: Mood normal.       EKG:   Recent Labs: 02/02/2023: Hemoglobin 10.3; Platelets 152 04/20/2023: ALT 26; BUN 27;  Creatinine, Ser 1.92; Potassium 4.3; Sodium 139; TSH 2.680    Lipid Panel    Component Value Date/Time   CHOL 126 08/21/2022 0502   CHOL 125 05/10/2022 0917   TRIG 74 08/21/2022 0502   HDL 34 (L) 08/21/2022 0502   HDL 46 05/10/2022 0917   CHOLHDL 3.7 08/21/2022 0502  VLDL 15 08/21/2022 0502   LDLCALC 77 08/21/2022 0502   LDLCALC 65 05/10/2022 0917   LDLCALC 73 11/03/2020 1032      Other studies Reviewed: Additional studies/ records that were reviewed today include:  Review of the above records demonstrates:       No data to display            ASSESSMENT AND PLAN:    ICD-10-CM   1. Coronary artery disease of native artery of native heart with stable angina pectoris  I25.118     2. Chronic diastolic CHF (congestive heart failure) (HCC)  I50.32     3. PERIPHERAL VASCULAR DISEASE  I73.9     4. New onset atrial fibrillation (HCC)  I48.91    In NSR now    5. Mixed hyperlipidemia  E78.2    Needs labs being done by primary at [place]. Creat 1.92 on 4/25       Problem List Items Addressed This Visit       Cardiovascular and Mediastinum   PERIPHERAL VASCULAR DISEASE   Coronary artery disease of native artery of native heart with stable angina pectoris - Primary   Chronic diastolic CHF (congestive heart failure) (HCC)   New onset atrial fibrillation (HCC)     Other   Hyperlipidemia       Disposition:   Return in about 2 months (around 12/23/2023).    Total time spent: 30 minutes  Signed,  Denyse Bathe, MD  10/23/2023 11:24 AM    [facility]

## 2023-10-25 ENCOUNTER — Encounter: Payer: Self-pay | Admitting: Podiatry

## 2023-10-25 ENCOUNTER — Ambulatory Visit: Admitting: Podiatry

## 2023-10-25 DIAGNOSIS — E119 Type 2 diabetes mellitus without complications: Secondary | ICD-10-CM

## 2023-10-25 DIAGNOSIS — M79675 Pain in left toe(s): Secondary | ICD-10-CM

## 2023-10-25 DIAGNOSIS — I739 Peripheral vascular disease, unspecified: Secondary | ICD-10-CM | POA: Diagnosis not present

## 2023-10-25 DIAGNOSIS — Z0189 Encounter for other specified special examinations: Secondary | ICD-10-CM | POA: Diagnosis not present

## 2023-10-25 DIAGNOSIS — B351 Tinea unguium: Secondary | ICD-10-CM | POA: Diagnosis not present

## 2023-10-25 DIAGNOSIS — M2042 Other hammer toe(s) (acquired), left foot: Secondary | ICD-10-CM | POA: Diagnosis not present

## 2023-10-25 DIAGNOSIS — E1142 Type 2 diabetes mellitus with diabetic polyneuropathy: Secondary | ICD-10-CM

## 2023-10-25 DIAGNOSIS — M79674 Pain in right toe(s): Secondary | ICD-10-CM | POA: Diagnosis not present

## 2023-10-25 DIAGNOSIS — M2041 Other hammer toe(s) (acquired), right foot: Secondary | ICD-10-CM

## 2023-10-27 ENCOUNTER — Other Ambulatory Visit (HOSPITAL_COMMUNITY): Payer: Self-pay

## 2023-10-30 NOTE — Progress Notes (Signed)
  Subjective:  Patient ID: Dylan Rosina Glendia Mickey., male    DOB: 06-15-1940,  MRN: [id number]  Dylan Rosina Glendia Mickey. presents to clinic today for for annual diabetic foot examination, at risk footcare. Patient has h/o diabetes, neuropathy and PAD and is seen for , and painful mycotic toenails of both feet that are difficult to trim. Pain interferes with daily activities and wearing enclosed shoe gear comfortably. He is accompanied by his wife on today's visit. He is monitored by Vascular Surgery for PAD. Chief Complaint  Patient presents with   Toe Pain    A1c was a 10 per patients report. DR. Bevely is his PCP. Last visit was last month.    New problem(s): None.   PCP is Bevely Doffing, FNP.  Allergies  Allergen Reactions   Plavix [Clopidogrel] Hives    Review of Systems: Negative except as noted in the HPI.  Objective: No changes noted in today's physical examination. There were no vitals filed for this visit. Dylan Rosina Glendia Mickey. is a pleasant 83 y.o. male in NAD. AAO x 3.   Diabetic foot exam was performed with the following findings:   Vascular Examination: CFT <4 seconds b/l. DP pulses diminished b/l. PT pulses diminished b/l. Digital hair absent. Skin temperature gradient warm to cool b/l. No ischemia or gangrene. No cyanosis or clubbing noted b/l. Dependent edema noted RLE>LLE.  Neurological Examination: Protective sensation diminished with 10g monofilament b/l.  Dermatological Examination: Pedal skin thin, shiny and atrophic b/l. No open wounds. No interdigital macerations.   Toenails 1-5 b/l thick, discolored, elongated with subungual debris and pain on dorsal palpation.   No corns, calluses, nor porokeratotic lesions.  Musculoskeletal Examination: Normal muscle strength 5/5 to all lower extremity muscle groups bilaterally. No pain, crepitus or joint limitation noted with ROM b/l LE. Hammertoe(s) bilateral 2nd toes, bilateral 3rd toes, and bilateral 4th toes..  Patient ambulates with walker assistance.  Radiographs: None     Assessment/Plan: 1. Pain due to onychomycosis of toenails of both feet   2. PAD (peripheral artery disease)   3. Acquired hammertoes of both feet   4. Diabetic polyneuropathy associated with type 2 diabetes mellitus (HCC)   5. Encounter for diabetic foot exam (HCC)   Diabetic foot examination performed today. All patient's and/or POA's questions/concerns addressed on today's visit. Toenails 1-5 b/l debrided in length and girth without incident. Continue foot and shoe inspections daily. Monitor blood glucose per PCP/Endocrinologist's recommendations. Continue soft, supportive shoe gear daily. Report any pedal injuries to medical professional. Call office if there are any questions/concerns. -Patient/POA to call should there be question/concern in the interim.   Return in about 4 months (around 02/25/2024).  Delon LITTIE Merlin, DPM      [facility] LOCATION: [address] [place] [address]                   Office [phone]   [facility] LOCATION: [address] [place], [place] [address] Office [phone]

## 2023-10-31 DIAGNOSIS — E039 Hypothyroidism, unspecified: Secondary | ICD-10-CM | POA: Diagnosis not present

## 2023-11-01 LAB — T4, FREE: Free T4: 1.36 ng/dL (ref 0.82–1.77)

## 2023-11-01 LAB — TSH: TSH: 1.75 u[IU]/mL (ref 0.450–4.500)

## 2023-11-06 ENCOUNTER — Other Ambulatory Visit

## 2023-11-06 DIAGNOSIS — C61 Malignant neoplasm of prostate: Secondary | ICD-10-CM

## 2023-11-07 ENCOUNTER — Other Ambulatory Visit (HOSPITAL_BASED_OUTPATIENT_CLINIC_OR_DEPARTMENT_OTHER): Payer: Self-pay

## 2023-11-07 ENCOUNTER — Encounter: Payer: Self-pay | Admitting: Nurse Practitioner

## 2023-11-07 ENCOUNTER — Ambulatory Visit: Admitting: Nurse Practitioner

## 2023-11-07 ENCOUNTER — Other Ambulatory Visit (HOSPITAL_COMMUNITY): Payer: Self-pay

## 2023-11-07 VITALS — BP 136/82 | HR 59 | Ht 78.0 in | Wt 244.0 lb

## 2023-11-07 DIAGNOSIS — E1122 Type 2 diabetes mellitus with diabetic chronic kidney disease: Secondary | ICD-10-CM | POA: Diagnosis not present

## 2023-11-07 DIAGNOSIS — E1142 Type 2 diabetes mellitus with diabetic polyneuropathy: Secondary | ICD-10-CM

## 2023-11-07 DIAGNOSIS — E039 Hypothyroidism, unspecified: Secondary | ICD-10-CM | POA: Diagnosis not present

## 2023-11-07 DIAGNOSIS — N1831 Chronic kidney disease, stage 3a: Secondary | ICD-10-CM

## 2023-11-07 DIAGNOSIS — N1832 Chronic kidney disease, stage 3b: Secondary | ICD-10-CM | POA: Diagnosis not present

## 2023-11-07 DIAGNOSIS — Z794 Long term (current) use of insulin: Secondary | ICD-10-CM | POA: Diagnosis not present

## 2023-11-07 LAB — POCT GLYCOSYLATED HEMOGLOBIN (HGB A1C): Hemoglobin A1C: 9.9 % — AB (ref 4.0–5.6)

## 2023-11-07 LAB — PSA: Prostate Specific Ag, Serum: 0.2 ng/mL (ref 0.0–4.0)

## 2023-11-07 MED ORDER — INSULIN ASPART 100 UNIT/ML FLEXPEN
10.0000 [IU] | PEN_INJECTOR | Freq: Three times a day (TID) | SUBCUTANEOUS | 3 refills | Status: AC
  Filled 2023-11-07 – 2023-12-24 (×8): qty 45, 93d supply, fill #0

## 2023-11-07 MED ORDER — FREESTYLE LIBRE 3 PLUS SENSOR MISC: 3 refills | Status: DC

## 2023-11-07 MED ORDER — TRESIBA FLEXTOUCH 100 UNIT/ML ~~LOC~~ SOPN
25.0000 [IU] | PEN_INJECTOR | Freq: Every day | SUBCUTANEOUS | 3 refills | Status: AC
  Filled 2023-11-07: qty 24, 96d supply, fill #0
  Filled 2023-12-29: qty 25, 100d supply, fill #0

## 2023-11-07 MED ORDER — LEVOTHYROXINE SODIUM 88 MCG PO TABS
88.0000 ug | ORAL_TABLET | Freq: Every day | ORAL | 3 refills | Status: AC
  Filled 2023-11-07 – 2023-11-14 (×2): qty 90, 90d supply, fill #0
  Filled 2024-02-09: qty 90, 90d supply, fill #1

## 2023-11-07 NOTE — Progress Notes (Signed)
 Endocrinology Follow Up Note       11/07/2023, 11:48 AM   Subjective:    Patient ID: Dylan Rosina Glendia Mickey., male    DOB: [birth year].  Dylan Rosina Glendia Mickey. is being seen in follow up after being seen in consultation for management of currently uncontrolled symptomatic diabetes requested by  Bevely Doffing, FNP.   Past Medical History:  Diagnosis Date   Acute lower UTI 09/20/2017   Bradycardia on ECG 07/31/2011   Coronary artery disease    hyperlipidemia   Decreased GFR 09/23/2019   Dehydration 09/21/2017   Diabetes mellitus    Diabetes mellitus without complication (HCC)    Hypercholesterolemia    Hypertension    Hypothyroidism    Left arm pain 01/29/2020   Paronychia of great toe of left foot 09/26/2018   Paronychia of great toe, left 09/26/2018   PERIPHERAL NEUROPATHY 01/13/2006   Qualifier: Diagnosis of  By: Tamra MD, Christine     Shortness of breath dyspnea    with exertion   Sleep apnea    Stroke (HCC) 08/10/2022   Vasovagal syncope    Weakness 09/20/2017    Past Surgical History:  Procedure Laterality Date   APPENDECTOMY     CARDIAC CATHETERIZATION N/A 06/16/2014   Procedure: Left Heart Cath;  Surgeon: Denyse DELENA Bathe, MD;  Location: [facility];  Service: Cardiovascular;  Laterality: N/A;   CIRCUMCISION, NON-NEWBORN     CYSTOSCOPY N/A 02/01/2023   Procedure: CYSTOSCOPY;  Surgeon: Sherrilee Belvie CROME, MD;  Location: [facility];  Service: Urology;  Laterality: N/A;   HERNIA REPAIR     LOWER EXTREMITY ANGIOGRAPHY Left 12/24/2018   Procedure: LOWER EXTREMITY ANGIOGRAPHY;  Surgeon: Jama Cordella MATSU, MD;  Location: [facility];  Service: Cardiovascular;  Laterality: Left;   THYROID  SURGERY     TRANSURETHRAL RESECTION OF PROSTATE N/A 02/01/2023   Procedure: TRANSURETHRAL RESECTION OF THE PROSTATE (TURP);  Surgeon: Sherrilee Belvie CROME, MD;  Location: [facility];  Service: Urology;   Laterality: N/A;    Social History   Socioeconomic History   Marital status: Married    Spouse name: Not on file   Number of children: Not on file   Years of education: Not on file   Highest education level: Not on file  Occupational History   Occupation: Truck Hospital Doctor    Comment: Retired  Tobacco Use   Smoking status: Former   Smokeless tobacco: Never  Advertising Account Planner   Vaping status: Never Used  Substance and Sexual Activity   Alcohol use: No   Drug use: No   Sexual activity: Not on file  Other Topics Concern   Not on file  Social History Narrative   ** Merged History Encounter **       Social Drivers of Health   Financial Resource Strain: Low Risk  (06/13/2023)   Received from [facility]   Overall Financial Resource Strain (CARDIA)    Difficulty of Paying Living Expenses: Not hard at all  Food Insecurity: No Food Insecurity (06/13/2023)   Received from [facility]   Hunger Vital Sign    Within the past 12 months, you worried that  your food would run out before you got the money to buy more.: Never true    Within the past 12 months, the food you bought just didn't last and you didn't have money to get more.: Never true  Transportation Needs: No Transportation Needs (06/13/2023)   Received from [facility] - Transportation    In the past 12 months, has lack of transportation kept you from medical appointments or from getting medications?: No    Lack of Transportation (Non-Medical): No  Physical Activity: Insufficiently Active (04/17/2023)   Exercise Vital Sign    Days of Exercise per Week: 7 days    Minutes of Exercise per Session: 20 min  Stress: No Stress Concern Present (04/17/2023)   [employer] of Occupational Health - Occupational Stress Questionnaire    Feeling of Stress : Not at all  Social Connections: Moderately Isolated (04/17/2023)   Social Connection and Isolation Panel    Frequency of  Communication with Friends and Family: More than three times a week    Frequency of Social Gatherings with Friends and Family: More than three times a week    Attends Religious Services: Never    Database Administrator or Organizations: No    Attends Banker Meetings: Never    Marital Status: Married    Family History  Problem Relation Age of Onset   Diabetes Father    Hypertension Father    Cancer Mother    Diabetes Brother     Outpatient Encounter Medications as of 11/07/2023  Medication Sig   apixaban  (ELIQUIS ) 2.5 MG TABS tablet Take 1 tablet (2.5 mg total) by mouth 2 (two) times daily.   atorvastatin  (LIPITOR) 80 MG tablet Take 1 tablet (80 mg total) by mouth daily.   cetirizine  (ZYRTEC ) 5 MG tablet Take 1 tablet (5 mg total) by mouth daily.   Continuous Glucose Receiver (FREESTYLE LIBRE 3 READER) DEVI Patient is to use to receive his blood glucose readings daily as directed by provider.   ezetimibe  (ZETIA ) 10 MG tablet Take 1 tablet (10 mg total) by mouth daily.   Fish Oil-Vitamin D  1000-1000 MG-UNIT CAPS Take 1 capsule by mouth daily.   furosemide  (LASIX ) 40 MG tablet Take 1 tablet (40 mg total) by mouth daily.   gabapentin  (NEURONTIN ) 300 MG capsule Take 1 capsule (300 mg total) by mouth 3 (three) times daily.   isosorbide  mononitrate (IMDUR ) 60 MG 24 hr tablet Take 1 tablet (60 mg total) by mouth daily.   metoprolol  succinate (TOPROL -XL) 25 MG 24 hr tablet Take 1 tablet (25 mg total) by mouth daily.   triamcinolone  cream (KENALOG ) 0.1 % apply cream externally to affected area twice dailiy.   [DISCONTINUED] Continuous Glucose Sensor (FREESTYLE LIBRE 3 PLUS SENSOR) MISC Change sensor every 15 days to monitor blood glucose readings as directed by provider.   [DISCONTINUED] insulin  aspart (NOVOLOG ) 100 UNIT/ML FlexPen Inject 8-14 Units into the skin 3 (three) times daily with meals.   [DISCONTINUED] insulin  degludec (TRESIBA  FLEXTOUCH) 100 UNIT/ML FlexTouch Pen Inject  25 Units into the skin at bedtime.   [DISCONTINUED] levothyroxine  (SYNTHROID ) 88 MCG tablet Take 1 tablet (88 mcg total) by mouth daily before breakfast.   Continuous Glucose Sensor (FREESTYLE LIBRE 3 PLUS SENSOR) MISC Change sensor every 15 days to monitor blood glucose readings as directed by provider.   insulin  aspart (NOVOLOG ) 100 UNIT/ML FlexPen Inject 10-16 Units into the skin 3 (three) times daily with meals.   insulin   degludec (TRESIBA  FLEXTOUCH) 100 UNIT/ML FlexTouch Pen Inject 25 Units into the skin at bedtime.   levothyroxine  (SYNTHROID ) 88 MCG tablet Take 1 tablet (88 mcg total) by mouth daily before breakfast.   No facility-administered encounter medications on file as of 11/07/2023.    ALLERGIES: Allergies  Allergen Reactions   Plavix [Clopidogrel] Hives    VACCINATION STATUS: Immunization History  Administered Date(s) Administered   Fluad Quad(high Dose 65+) 09/22/2019, 11/03/2020, 11/10/2021   Fluad Trivalent(High Dose 65+) 12/04/2022   Influenza Whole 10/05/2005, 12/17/2006   Influenza,inj,quad, With Preservative 11/25/2018   Moderna Sars-Covid-2 Vaccination 03/06/2019, 04/03/2019, 01/06/2020   Pneumococcal Conjugate-13 01/29/2020   Pneumococcal Polysaccharide-23 08/21/2005   Td 12/17/2006   Tdap 06/10/2021   Zoster Recombinant(Shingrix) 06/03/2021, 09/20/2021    Diabetes He presents for his follow-up diabetic visit. He has type 2 diabetes mellitus. Onset time: Diagnosed at approx age of 51. His disease course has been improving. There are no hypoglycemic associated symptoms. Associated symptoms include foot paresthesias. There are no hypoglycemic complications. Symptoms are stable. Diabetic complications include heart disease, nephropathy, peripheral neuropathy and PVD. Risk factors for coronary artery disease include diabetes mellitus, dyslipidemia, family history, male sex and hypertension. Current diabetic treatment includes intensive insulin  program. He is  compliant with treatment most of the time. His weight is fluctuating minimally. He is following a generally healthy diet. Meal planning includes avoidance of concentrated sweets. He has had a previous visit with a dietitian. He participates in exercise intermittently. His home blood glucose trend is decreasing steadily. His overall blood glucose range is >200 mg/dl. (He presents today, accompanied by his wife, with his CGM showing slowly improving glycemic profile.  His POCT A1c today is 9.9%, improving from last visit of 10.1%.  Analysis of his CGM shows TIR 31%, TAR 69%, TBR 0% with a GMI of 8.5%.  He has been taking his insulin  more consistently.) An ACE inhibitor/angiotensin II receptor blocker is being taken. He sees a podiatrist.Eye exam is current.     Review of systems  Constitutional: + Minimally fluctuating body weight, current Body mass index is 28.2 kg/m., no fatigue, no subjective hyperthermia, no subjective hypothermia Eyes: no blurry vision, no xerophthalmia ENT: no sore throat, no nodules palpated in throat, no dysphagia/odynophagia, no hoarseness Cardiovascular: no chest pain, no shortness of breath, no palpitations, no leg swelling Respiratory: no cough, no shortness of breath Gastrointestinal: no nausea/vomiting/diarrhea Musculoskeletal: no muscle/joint aches, walks with cane/walker Skin: no rashes, no hyperemia Neurological: no tremors, + numbness/tingling to BLE, no dizziness Psychiatric: no depression, no anxiety   Objective:     BP 136/82 (BP Location: Right Arm, Patient Position: Sitting, Cuff Size: Large)   Pulse (!) 59   Ht 6' 6 (1.981 m)   Wt 244 lb (110.7 kg)   BMI 28.20 kg/m   Wt Readings from Last 3 Encounters:  11/07/23 244 lb (110.7 kg)  10/23/23 241 lb 6.4 oz (109.5 kg)  09/27/23 240 lb (108.9 kg)     BP Readings from Last 3 Encounters:  11/07/23 136/82  10/23/23 126/64  09/27/23 (!) 162/77     Physical Exam- Limited  Constitutional:  Body  mass index is 28.2 kg/m. , not in acute distress, normal state of mind Eyes:  EOMI, no exophthalmos Musculoskeletal: no gross deformities, strength intact in all four extremities, no gross restriction of joint movements, walks with cane/walker Skin:  no rashes, no hyperemia Neurological: no tremor with outstretched hands    CMP ( most recent) CMP  Component Value Date/Time   NA 139 04/20/2023 1022   K 4.3 04/20/2023 1022   CL 101 04/20/2023 1022   CO2 22 04/20/2023 1022   GLUCOSE 173 (H) 04/20/2023 1022   GLUCOSE 219 (H) 02/02/2023 0418   BUN 27 04/20/2023 1022   CREATININE 1.92 (H) 04/20/2023 1022   CREATININE 1.34 (H) 02/02/2021 0839   CALCIUM  10.1 04/20/2023 1022   PROT 6.4 04/20/2023 1022   ALBUMIN 4.1 04/20/2023 1022   AST 20 04/20/2023 1022   ALT 26 04/20/2023 1022   ALKPHOS 163 (H) 04/20/2023 1022   BILITOT 0.8 04/20/2023 1022   GFRNONAA 27 (L) 02/03/2023 0533   GFRNONAA 54 (L) 04/29/2020 1141   GFRAA 63 04/29/2020 1141     Diabetic Labs (most recent): Lab Results  Component Value Date   HGBA1C 9.9 (A) 11/07/2023   HGBA1C 10.1 (A) 08/01/2023   HGBA1C 11.3 (A) 04/26/2023   MICROALBUR 5.0 02/02/2021   MICROALBUR 1.4 09/22/2019   MICROALBUR 0.67 12/17/2006     Lipid Panel ( most recent) Lipid Panel     Component Value Date/Time   CHOL 126 08/21/2022 0502   CHOL 125 05/10/2022 0917   TRIG 74 08/21/2022 0502   HDL 34 (L) 08/21/2022 0502   HDL 46 05/10/2022 0917   CHOLHDL 3.7 08/21/2022 0502   VLDL 15 08/21/2022 0502   LDLCALC 77 08/21/2022 0502   LDLCALC 65 05/10/2022 0917   LDLCALC 73 11/03/2020 1032   LABVLDL 14 05/10/2022 0917      Lab Results  Component Value Date   TSH 1.750 10/31/2023   TSH 2.680 04/20/2023   TSH 1.510 08/20/2022   TSH 1.730 05/10/2022   TSH 2.140 12/23/2021   TSH 1.790 06/14/2021   TSH 1.45 11/03/2020   TSH 1.75 03/31/2020   TSH 1.17 09/22/2019   TSH 0.94 03/19/2019   FREET4 1.36 10/31/2023   FREET4 1.36  04/20/2023   FREET4 1.45 05/10/2022   FREET4 1.41 12/23/2021   FREET4 1.3 09/22/2019   FREET4 1.3 03/19/2019   FREET4 1.32 07/31/2011   FREET4 1.09 06/17/2008   FREET4 1.31 12/18/2007   FREET4 1.27 05/10/2006           Assessment & Plan:   1) Type 2 diabetes mellitus with stage 3a chronic kidney disease, with long-term current use of insulin  (HCC)  He presents today, accompanied by his wife, with his CGM showing slowly improving glycemic profile.  His POCT A1c today is 9.9%, improving from last visit of 10.1%.  Analysis of his CGM shows TIR 31%, TAR 69%, TBR 0% with a GMI of 8.5%.  He has been taking his insulin  more consistently.  He sometimes forget to take his meal time insulin  before he eats and will take it shortly after.  - Dylan Rosina Glendia Mickey. has currently uncontrolled symptomatic type 2 DM since 83 years of age.   -Recent labs reviewed.  - I had a long discussion with him about the progressive nature of diabetes and the pathology behind its complications. -his diabetes is complicated by CAD, CKD, PVD, neuropathy and he remains at a high risk for more acute and chronic complications which include CAD, CVA, CKD, retinopathy, and neuropathy. These are all discussed in detail with him.  The following Lifestyle Medicine recommendations according to American College of Lifestyle Medicine [facility]) were discussed and offered to patient and he agrees to start the journey:  A. Whole Foods, Plant-based plate comprising of fruits and vegetables, plant-based proteins, whole-grain carbohydrates was  discussed in detail with the patient.   A list for source of those nutrients were also provided to the patient.  Patient will use only water  or unsweetened tea for hydration. B.  The need to stay away from risky substances including alcohol, smoking; obtaining 7 to 9 hours of restorative sleep, at least 150 minutes of moderate intensity exercise weekly, the importance of healthy social  connections,  and stress reduction techniques were discussed. C.  A full color page of  Calorie density of various food groups per pound showing examples of each food groups was provided to the patient.  - Nutritional counseling repeated at each appointment due to patients tendency to fall back in to old habits.  - The patient admits there is a room for improvement in their diet and drink choices. -  Suggestion is made for the patient to avoid simple carbohydrates from their diet including Cakes, Sweet Desserts / Pastries, Ice Cream, Soda (diet and regular), Sweet Tea, Candies, Chips, Cookies, Sweet Pastries, Store Bought Juices, Alcohol in Excess of 1-2 drinks a day, Artificial Sweeteners, Coffee Creamer, and Sugar-free Products. This will help patient to have stable blood glucose profile and potentially avoid unintended weight gain.   - I encouraged the patient to switch to unprocessed or minimally processed complex starch and increased protein intake (animal or plant source), fruits, and vegetables.   - Patient is advised to stick to a routine mealtimes to eat 3 meals a day and avoid unnecessary snacks (to snack only to correct hypoglycemia).  - I have approached him with the following individualized plan to manage his diabetes and patient agrees:   -He is advised to continue his Tresiba  25 units SQ nightly and increase Novolog  to 10-16 units TID with meals if glucose is above 90 and he is eating (Specific instructions on how to titrate insulin  dosage based on glucose readings given to patient in writing).  -he is encouraged to continue monitoring glucose 4 times daily (using his CGM), before meals and before bed, and to call the clinic if he has readings less than 70 or above 300 for 3 tests in a row.   - he is warned not to take insulin  without proper monitoring per orders. - Adjustment parameters are given to him for hypo and hyperglycemia in writing.  - his Glipizide  was be  discontinued, risk outweighs benefit for this patient- risk of hypoglycemia given advanced age and CKD. - he is not a candidate for full dose Metformin  due to concurrent renal insufficiency, I discontinued this on previous visits to de-escalate his treatment and preserve kidney function.  - he is not an ideal candidate for incretin therapy due to body habitus with BMI at 25.  -I would not consider him to be a great candidate for SGLT2i despite his CHF history as it may increase his risk of falls (due to polyuria), UTI, and yeast formation.  - Specific targets for  A1c; LDL, HDL, and Triglycerides were discussed with the patient.  2) Blood Pressure /Hypertension:  his blood pressure is controlled to target for his age.   he is advised to continue his current medications as prescribed by PCP/nephrology.  3) Lipids/Hyperlipidemia:    Review of his recent lipid panel from 05/10/22 showed controlled LDL at 65.  he is advised to continue Lipitor 80 mg daily at bedtime and Zetia  10 mg po daily.  Side effects and precautions discussed with him.    4)  Weight/Diet:  his Body mass index  is 28.2 kg/m.  -  he is NOT a candidate for weight loss.  Exercise, and detailed carbohydrates information provided  -  detailed on discharge instructions.  5) Hypothyroidism-unspecified The details surrounding his diagnosis are not available.    His previsit TFTs are consistent with appropriate hormone replacement.  He is advised to continue his Levothyroxine  88 mcg po daily before breakfast.     - The correct intake of thyroid  hormone (Levothyroxine , Synthroid ), is on empty stomach first thing in the morning, with water , separated by at least 30 minutes from breakfast and other medications,  and separated by more than 4 hours from calcium , iron, multivitamins, acid reflux medications (PPIs).  - This medication is a life-long medication and will be needed to correct thyroid  hormone imbalances for the rest of your life.   The dose may change from time to time, based on thyroid  blood work.  - It is extremely important to be consistent taking this medication, near the same time each morning.  -AVOID TAKING PRODUCTS CONTAINING BIOTIN (commonly found in Hair, Skin, Nails vitamins) AS IT INTERFERES WITH THE VALIDITY OF THYROID  FUNCTION BLOOD TESTS.  6) Vitamin D  deficiency His most recent vitamin D  level from 05/10/22 was slightly low at 28.8.  He is already taking supplement but not sure of the amount.  I advised to take Vitamin d3 5000 units daily.  7) Chronic Care/Health Maintenance: -he is on ACEI/ARB and Statin medications and is encouraged to initiate and continue to follow up with Ophthalmology, Dentist, Podiatrist at least yearly or according to recommendations, and advised to stay away from smoking. I have recommended yearly flu vaccine and pneumonia vaccine at least every 5 years; moderate intensity exercise for up to 150 minutes weekly; and sleep for at least 7 hours a day.  - he is advised to maintain close follow up with Bevely Doffing, FNP for primary care needs, as well as his other providers for optimal and coordinated care.     I spent  30  minutes in the care of the patient today including review of labs from CMP, Lipids, Thyroid  Function, Hematology (current and previous including abstractions from other facilities); face-to-face time discussing  his blood glucose readings/logs, discussing hypoglycemia and hyperglycemia episodes and symptoms, medications doses, his options of short and long term treatment based on the latest standards of care / guidelines;  discussion about incorporating lifestyle medicine;  and documenting the encounter. Risk reduction counseling performed per USPSTF guidelines to reduce obesity and cardiovascular risk factors.     Please refer to Patient Instructions for Blood Glucose Monitoring and Insulin /Medications Dosing Guide  in media tab for additional information. Please   also refer to  Patient Self Inventory in the Media  tab for reviewed elements of pertinent patient history.  Dylan Ku May. participated in the discussions, expressed understanding, and voiced agreement with the above plans.  All questions were answered to his satisfaction. he is encouraged to contact clinic should he have any questions or concerns prior to his return visit.     Follow up plan: - Return in about 3 months (around 02/07/2024) for Diabetes F/U with A1c in office, No previsit labs, Bring meter and logs.  Benton Rio, [facility] [address] [place], [place] [address] Phone: [phone] Fax: [phone]  11/07/2023, 11:48 AM

## 2023-11-08 ENCOUNTER — Other Ambulatory Visit (HOSPITAL_COMMUNITY): Payer: Self-pay

## 2023-11-09 ENCOUNTER — Other Ambulatory Visit: Payer: Self-pay | Admitting: Cardiovascular Disease

## 2023-11-09 ENCOUNTER — Other Ambulatory Visit: Payer: Self-pay | Admitting: Internal Medicine

## 2023-11-09 ENCOUNTER — Other Ambulatory Visit (HOSPITAL_COMMUNITY): Payer: Self-pay

## 2023-11-09 DIAGNOSIS — E782 Mixed hyperlipidemia: Secondary | ICD-10-CM

## 2023-11-09 DIAGNOSIS — R067 Sneezing: Secondary | ICD-10-CM

## 2023-11-09 MED ORDER — CETIRIZINE HCL 5 MG PO TABS
5.0000 mg | ORAL_TABLET | Freq: Every day | ORAL | 3 refills | Status: AC
  Filled 2023-11-09: qty 90, 90d supply, fill #0
  Filled 2024-02-06: qty 90, 90d supply, fill #1

## 2023-11-12 ENCOUNTER — Other Ambulatory Visit (HOSPITAL_COMMUNITY): Payer: Self-pay

## 2023-11-12 ENCOUNTER — Other Ambulatory Visit: Payer: Self-pay

## 2023-11-12 MED ORDER — ATORVASTATIN CALCIUM 80 MG PO TABS
80.0000 mg | ORAL_TABLET | Freq: Every day | ORAL | 3 refills | Status: AC
  Filled 2023-11-12: qty 90, 90d supply, fill #0
  Filled 2024-01-14 – 2024-01-21 (×2): qty 90, 90d supply, fill #1

## 2023-11-12 MED ORDER — EZETIMIBE 10 MG PO TABS
10.0000 mg | ORAL_TABLET | Freq: Every day | ORAL | 3 refills | Status: AC
  Filled 2023-11-12: qty 90, 90d supply, fill #0
  Filled 2024-02-06: qty 90, 90d supply, fill #1

## 2023-11-13 ENCOUNTER — Ambulatory Visit: Payer: Self-pay | Admitting: Urology

## 2023-11-13 ENCOUNTER — Other Ambulatory Visit: Payer: Self-pay

## 2023-11-14 ENCOUNTER — Ambulatory Visit: Admitting: Urology

## 2023-11-14 ENCOUNTER — Other Ambulatory Visit: Payer: Self-pay

## 2023-11-14 ENCOUNTER — Other Ambulatory Visit (HOSPITAL_COMMUNITY): Payer: Self-pay

## 2023-11-14 VITALS — BP 120/65 | HR 83

## 2023-11-14 DIAGNOSIS — R339 Retention of urine, unspecified: Secondary | ICD-10-CM

## 2023-11-14 DIAGNOSIS — R338 Other retention of urine: Secondary | ICD-10-CM | POA: Diagnosis not present

## 2023-11-14 DIAGNOSIS — N401 Enlarged prostate with lower urinary tract symptoms: Secondary | ICD-10-CM

## 2023-11-14 DIAGNOSIS — C61 Malignant neoplasm of prostate: Secondary | ICD-10-CM | POA: Diagnosis not present

## 2023-11-14 LAB — URINALYSIS, ROUTINE W REFLEX MICROSCOPIC
Bilirubin, UA: NEGATIVE
Glucose, UA: NEGATIVE
Ketones, UA: NEGATIVE
Leukocytes,UA: NEGATIVE
Nitrite, UA: NEGATIVE
Protein,UA: NEGATIVE
RBC, UA: NEGATIVE
Specific Gravity, UA: 1.02 (ref 1.005–1.030)
Urobilinogen, Ur: 1 mg/dL (ref 0.2–1.0)
pH, UA: 6 (ref 5.0–7.5)

## 2023-11-14 NOTE — Progress Notes (Signed)
 11/14/2023 9:47 AM   Dylan Robles Dylan Robles 03-03-1940 [id number]  Referring provider: Melvenia Manus BRAVO, MD [address] [place],  [place] [address]  Followup BPh and prostate cancer    HPI: [name] is a 83yo here for followup for prostate cancer and BPH. PSA 0.2. IPSS 1 QOL  0 after TURP. Uirne stream strong. No straining to urinate. Nocturia 0-1x. No urinary hesitancy. No other compalitns today   PMH: Past Medical History:  Diagnosis Date   Acute lower UTI 09/20/2017   Bradycardia on ECG 07/31/2011   Coronary artery disease    hyperlipidemia   Decreased GFR 09/23/2019   Dehydration 09/21/2017   Diabetes mellitus    Diabetes mellitus without complication (HCC)    Hypercholesterolemia    Hypertension    Hypothyroidism    Left arm pain 01/29/2020   Paronychia of great toe of left foot 09/26/2018   Paronychia of great toe, left 09/26/2018   PERIPHERAL NEUROPATHY 01/13/2006   Qualifier: Diagnosis of  By: Tamra MD, Christine     Shortness of breath dyspnea    with exertion   Sleep apnea    Stroke (HCC) 08/10/2022   Vasovagal syncope    Weakness 09/20/2017    Surgical History: Past Surgical History:  Procedure Laterality Date   APPENDECTOMY     CARDIAC CATHETERIZATION N/A 06/16/2014   Procedure: Left Heart Cath;  Surgeon: Denyse DELENA Bathe, MD;  Location: [facility];  Service: Cardiovascular;  Laterality: N/A;   CIRCUMCISION, NON-NEWBORN     CYSTOSCOPY N/A 02/01/2023   Procedure: CYSTOSCOPY;  Surgeon: Sherrilee Belvie CROME, MD;  Location: [facility];  Service: Urology;  Laterality: N/A;   HERNIA REPAIR     LOWER EXTREMITY ANGIOGRAPHY Left 12/24/2018   Procedure: LOWER EXTREMITY ANGIOGRAPHY;  Surgeon: Jama Cordella MATSU, MD;  Location: [facility];  Service: Cardiovascular;  Laterality: Left;   THYROID  SURGERY     TRANSURETHRAL RESECTION OF PROSTATE N/A 02/01/2023   Procedure: TRANSURETHRAL RESECTION OF THE PROSTATE (TURP);  Surgeon: Sherrilee Belvie CROME, MD;  Location: [facility];  Service: Urology;  Laterality: N/A;    Home Medications:  Allergies as of 11/14/2023       Reactions   Plavix [clopidogrel] Hives        Medication List        Accurate as of November 14, 2023  9:47 AM. If you have any questions, ask your nurse or doctor.          atorvastatin  80 MG tablet Commonly known as: LIPITOR Take 1 tablet (80 mg total) by mouth daily.   cetirizine  5 MG tablet Commonly known as: ZYRTEC  Take 1 tablet (5 mg total) by mouth daily.   Eliquis  2.5 MG Tabs tablet Generic drug: apixaban  Take 1 tablet (2.5 mg total) by mouth 2 (two) times daily.   ezetimibe  10 MG tablet Commonly known as: ZETIA  Take 1 tablet (10 mg total) by mouth daily.   Fish Oil-Vitamin D  1000-1000 MG-UNIT Caps Take 1 capsule by mouth daily.   FreeStyle Libre 3 Plus Sensor Misc Change sensor every 15 days to monitor blood glucose readings as directed by provider.   FreeStyle [place] 3 Reader [facility] Patient is to use to receive his blood glucose readings daily as directed by provider.   furosemide  40 MG tablet Commonly known as: Lasix  Take 1 tablet (40 mg total) by mouth daily.   gabapentin  300 MG capsule Commonly known as: NEURONTIN  Take 1 capsule (  300 mg total) by mouth 3 (three) times daily.   insulin  aspart 100 UNIT/ML FlexPen Commonly known as: NOVOLOG  Inject 10-16 Units into the skin 3 (three) times daily with meals.   isosorbide  mononitrate 60 MG 24 hr tablet Commonly known as: IMDUR  Take 1 tablet (60 mg total) by mouth daily.   levothyroxine  88 MCG tablet Commonly known as: SYNTHROID  Take 1 tablet (88 mcg total) by mouth daily before breakfast.   metoprolol  succinate 25 MG 24 hr tablet Commonly known as: TOPROL -XL Take 1 tablet (25 mg total) by mouth daily.   Tresiba  FlexTouch 100 UNIT/ML FlexTouch Pen Generic drug: insulin  degludec Inject 25 Units into the skin at bedtime.   triamcinolone  cream 0.1 % Commonly known as:  KENALOG  apply cream externally to affected area twice dailiy.        Allergies:  Allergies  Allergen Reactions   Plavix [Clopidogrel] Hives    Family History: Family History  Problem Relation Age of Onset   Diabetes Father    Hypertension Father    Cancer Mother    Diabetes Brother     Social History:  reports that he has quit smoking. He has never used smokeless tobacco. He reports that he does not drink alcohol and does not use drugs.  ROS: All other review of systems were reviewed and are negative except what is noted above in HPI  Physical Exam: BP 120/65   Pulse 83   Constitutional:  Alert and oriented, No acute distress. HEENT: [place] AT, moist mucus membranes.  Trachea midline, no masses. Cardiovascular: No clubbing, cyanosis, or edema. Respiratory: Normal respiratory effort, no increased work of breathing. GI: Abdomen is soft, nontender, nondistended, no abdominal masses GU: No CVA tenderness.  Lymph: No cervical or inguinal lymphadenopathy. Skin: No rashes, bruises or suspicious lesions. Neurologic: Grossly intact, no focal deficits, moving all 4 extremities. Psychiatric: Normal mood and affect.  Laboratory Data: Lab Results  Component Value Date   WBC 16.6 (H) 02/02/2023   HGB 10.3 (L) 02/02/2023   HCT 32.2 (L) 02/02/2023   MCV 92.3 02/02/2023   PLT 152 02/02/2023    Lab Results  Component Value Date   CREATININE 1.92 (H) 04/20/2023    No results found for: PSA  No results found for: TESTOSTERONE  Lab Results  Component Value Date   HGBA1C 9.9 (A) 11/07/2023    Urinalysis    Component Value Date/Time   COLORURINE YELLOW (A) 08/21/2022 0450   APPEARANCEUR Clear 05/07/2023 1438   LABSPEC 1.029 08/21/2022 0450   PHURINE 5.0 08/21/2022 0450   GLUCOSEU Negative 05/07/2023 1438   HGBUR NEGATIVE 08/21/2022 0450   BILIRUBINUR Negative 05/07/2023 1438   KETONESUR NEGATIVE 08/21/2022 0450   PROTEINUR Negative 05/07/2023 1438   PROTEINUR  NEGATIVE 08/21/2022 0450   UROBILINOGEN 0.2 07/28/2011 1351   NITRITE Negative 05/07/2023 1438   NITRITE NEGATIVE 08/21/2022 0450   LEUKOCYTESUR 1+ (A) 05/07/2023 1438   LEUKOCYTESUR NEGATIVE 08/21/2022 0450    Lab Results  Component Value Date   LABMICR 8.7 06/08/2023   WBCUA 6-10 (A) 05/07/2023   LABEPIT 0-10 05/07/2023   BACTERIA None seen 05/07/2023    Pertinent Imaging:  No results found for this or any previous visit.  No results found for this or any previous visit.  No results found for this or any previous visit.  No results found for this or any previous visit.  Results for orders placed during the hospital encounter of 05/17/22  US  RENAL  Narrative CLINICAL DATA:  Chronic renal disease  EXAM: RENAL / URINARY TRACT ULTRASOUND COMPLETE  COMPARISON:  None Available.  FINDINGS: Right Kidney:  Renal measurements: 10 x 5.1 x 4.7 cm = volume: 124 mL. Echogenicity within normal limits. No mass or hydronephrosis visualized.  Left Kidney:  Renal measurements: 11.7 x 5.5 x 4.6 cm = volume: 155 mL. Contains 2 cysts with the largest measuring 2.6 cm. No follow-up imaging recommended for the cysts.  Bladder:  Mild trabeculation of the bladder wall. A contour abnormality at the base of the bladder measuring 2.3 cm is consistent with mass effect on the bladder base by the prostate. The same appearance was identified on the May 12, 2021 CT scan, coronal imaging. Large 268 cc postvoid residual.  Other:  None.  IMPRESSION: 1. The kidneys are unremarkable. 2. Mild trabeculation of the bladder wall. 3. Contour abnormality at the base of the bladder is consistent with mass effect on the bladder base by the prostate. 4. Large 268 cc postvoid residual.   Electronically Signed By: Alm Pouch III M.D. On: 05/19/2022 19:01  No results found for this or any previous visit.  No results found for this or any previous visit.  Results for orders placed  during the hospital encounter of 09/12/17  CT Renal Stone Study  Narrative CLINICAL DATA:  Dysuria  EXAM: CT ABDOMEN AND PELVIS WITHOUT CONTRAST  TECHNIQUE: Multidetector CT imaging of the abdomen and pelvis was performed following the standard protocol without IV contrast.  COMPARISON:  None.  FINDINGS: Lower chest: Lung bases demonstrate no acute consolidation or effusion. The heart size is within normal limits. Coronary vascular calcification  Hepatobiliary: No focal hepatic abnormality or biliary dilatation. Small calcifications in the right upper quadrant may reflect small stones at the gallbladder neck. No gallbladder wall inflammation.  Pancreas: No inflammatory change. Diffusely atrophic. Possible 9 mm low-density cystic lesion at the uncinate process.  Spleen: Normal in size without focal abnormality.  Adrenals/Urinary Tract: Adrenal glands are within normal limits. Moderate perinephric fat stranding. Mild bilateral hydronephrosis and hydroureter. Probable cysts within the left kidney. No ureteral stone. Bladder distention.  Stomach/Bowel: The stomach is nonenlarged. No dilated small bowel. No colon wall thickening.  Vascular/Lymphatic: Moderate to marked aortic atherosclerosis. No aneurysm. No significantly enlarged lymph nodes.  Reproductive: Enlarged prostate gland with calcification. Mass effect on the posterior bladder  Other: No free air or free fluid. Fat within the left inguinal canal.  Musculoskeletal: Degenerative changes. No fracture or malalignment. 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 This measures about 6.6 cm transverse.  IMPRESSION: 1. Moderate perinephric edema bilaterally with mild hydronephrosis and hydroureter but no ureteral stone. Moderate to marked bladder distension, question outlet obstruction. There is slight enlargement of the prostate gland. 2. Possible small stones at the neck of the  gallbladder 3. Possible 9 mm low-density cystic lesion at the uncinate process of the pancreas. When the patient is clinically stable and able to follow directions and hold their breath (preferably as an outpatient) further evaluation with dedicated abdominal MRI should be considered.   Electronically Signed By: Luke Bun M.D. On: 09/12/2017 15:14   Assessment & Plan:    1. Malignant neoplasm of prostate (HCC) (Primary) Followup 6 months with a PSA - Urinalysis, Routine w reflex microscopic  2. Benign prostatic hyperplasia with incomplete bladder emptying Improved after TURP  3. Urinary retention Resolved after TURP   No follow-ups on file.  Belvie Clara, MD  [facility]  [place]

## 2023-11-20 ENCOUNTER — Encounter: Payer: Self-pay | Admitting: Urology

## 2023-11-20 NOTE — Patient Instructions (Signed)

## 2023-11-26 ENCOUNTER — Other Ambulatory Visit (HOSPITAL_COMMUNITY): Payer: Self-pay

## 2023-11-26 ENCOUNTER — Other Ambulatory Visit: Payer: Self-pay

## 2023-11-27 ENCOUNTER — Other Ambulatory Visit: Payer: Self-pay

## 2023-12-05 ENCOUNTER — Other Ambulatory Visit (HOSPITAL_COMMUNITY): Payer: Self-pay

## 2023-12-05 ENCOUNTER — Other Ambulatory Visit: Payer: Self-pay

## 2023-12-11 ENCOUNTER — Other Ambulatory Visit: Payer: Self-pay

## 2023-12-21 ENCOUNTER — Other Ambulatory Visit: Payer: Self-pay | Admitting: Cardiovascular Disease

## 2023-12-21 ENCOUNTER — Other Ambulatory Visit (HOSPITAL_COMMUNITY): Payer: Self-pay

## 2023-12-21 ENCOUNTER — Other Ambulatory Visit: Payer: Self-pay

## 2023-12-21 MED ORDER — APIXABAN 2.5 MG PO TABS
2.5000 mg | ORAL_TABLET | Freq: Two times a day (BID) | ORAL | 3 refills | Status: AC
  Filled 2023-12-21: qty 60, 30d supply, fill #0
  Filled 2024-01-14: qty 60, 30d supply, fill #1

## 2023-12-24 ENCOUNTER — Other Ambulatory Visit (HOSPITAL_COMMUNITY): Payer: Self-pay

## 2023-12-24 ENCOUNTER — Other Ambulatory Visit: Payer: Self-pay

## 2023-12-25 ENCOUNTER — Ambulatory Visit: Admitting: Cardiovascular Disease

## 2023-12-25 ENCOUNTER — Other Ambulatory Visit (HOSPITAL_COMMUNITY): Payer: Self-pay

## 2023-12-25 ENCOUNTER — Encounter: Payer: Self-pay | Admitting: Cardiovascular Disease

## 2023-12-25 VITALS — BP 122/76 | HR 85 | Ht 78.0 in | Wt 239.8 lb

## 2023-12-25 DIAGNOSIS — I340 Nonrheumatic mitral (valve) insufficiency: Secondary | ICD-10-CM

## 2023-12-25 DIAGNOSIS — E782 Mixed hyperlipidemia: Secondary | ICD-10-CM

## 2023-12-25 DIAGNOSIS — R0602 Shortness of breath: Secondary | ICD-10-CM

## 2023-12-25 DIAGNOSIS — Z8679 Personal history of other diseases of the circulatory system: Secondary | ICD-10-CM

## 2023-12-25 DIAGNOSIS — I25118 Atherosclerotic heart disease of native coronary artery with other forms of angina pectoris: Secondary | ICD-10-CM

## 2023-12-25 DIAGNOSIS — I351 Nonrheumatic aortic (valve) insufficiency: Secondary | ICD-10-CM

## 2023-12-25 DIAGNOSIS — R600 Localized edema: Secondary | ICD-10-CM

## 2023-12-25 DIAGNOSIS — I5032 Chronic diastolic (congestive) heart failure: Secondary | ICD-10-CM

## 2023-12-25 MED ORDER — SPIRONOLACTONE 25 MG PO TABS
25.0000 mg | ORAL_TABLET | Freq: Every day | ORAL | 3 refills | Status: AC
  Filled 2023-12-25: qty 30, 30d supply, fill #0
  Filled 2024-01-14 – 2024-01-17 (×2): qty 30, 30d supply, fill #1

## 2023-12-25 NOTE — Progress Notes (Signed)
 Cardiology Office Note   Date:  12/25/2023   ID:  Dylan Ku Jaceyon Strole., DOB [birth year], MRN [id number]  PCP:  Bevely Doffing, FNP  Cardiologist:  Denyse Bathe, MD      History of Present Illness: Dylan Leu. is a 83 y.o. male who presents for  Chief Complaint  Patient presents with   Follow-up    2 month follow up    No complaints.      Past Medical History:  Diagnosis Date   Acute lower UTI 09/20/2017   Bradycardia on ECG 07/31/2011   Coronary artery disease    hyperlipidemia   Decreased GFR 09/23/2019   Dehydration 09/21/2017   Diabetes mellitus    Diabetes mellitus without complication (HCC)    Hypercholesterolemia    Hypertension    Hypothyroidism    Left arm pain 01/29/2020   Paronychia of great toe of left foot 09/26/2018   Paronychia of great toe, left 09/26/2018   PERIPHERAL NEUROPATHY 01/13/2006   Qualifier: Diagnosis of  By: Tamra MD, Christine     Shortness of breath dyspnea    with exertion   Sleep apnea    Stroke (HCC) 08/10/2022   Vasovagal syncope    Weakness 09/20/2017     Past Surgical History:  Procedure Laterality Date   APPENDECTOMY     CARDIAC CATHETERIZATION N/A 06/16/2014   Procedure: Left Heart Cath;  Surgeon: Denyse DELENA Bathe, MD;  Location: [facility];  Service: Cardiovascular;  Laterality: N/A;   CIRCUMCISION, NON-NEWBORN     CYSTOSCOPY N/A 02/01/2023   Procedure: CYSTOSCOPY;  Surgeon: Sherrilee Belvie CROME, MD;  Location: [facility];  Service: Urology;  Laterality: N/A;   HERNIA REPAIR     LOWER EXTREMITY ANGIOGRAPHY Left 12/24/2018   Procedure: LOWER EXTREMITY ANGIOGRAPHY;  Surgeon: Jama Cordella MATSU, MD;  Location: [facility];  Service: Cardiovascular;  Laterality: Left;   THYROID  SURGERY     TRANSURETHRAL RESECTION OF PROSTATE N/A 02/01/2023   Procedure: TRANSURETHRAL RESECTION OF THE PROSTATE (TURP);  Surgeon: Sherrilee Belvie CROME, MD;  Location: [facility];  Service: Urology;  Laterality: N/A;      Current Outpatient Medications  Medication Sig Dispense Refill   apixaban  (ELIQUIS ) 2.5 MG TABS tablet Take 1 tablet (2.5 mg total) by mouth 2 (two) times daily. 60 tablet 3   atorvastatin  (LIPITOR) 80 MG tablet Take 1 tablet (80 mg total) by mouth daily. 90 tablet 3   cetirizine  (ZYRTEC ) 5 MG tablet Take 1 tablet (5 mg total) by mouth daily. 90 tablet 3   Continuous Glucose Receiver (FREESTYLE LIBRE 3 READER) DEVI Patient is to use to receive his blood glucose readings daily as directed by provider. 1 each 0   Continuous Glucose Sensor (FREESTYLE LIBRE 3 PLUS SENSOR) MISC Change sensor every 15 days to monitor blood glucose readings as directed by provider. 6 each 3   ezetimibe  (ZETIA ) 10 MG tablet Take 1 tablet (10 mg total) by mouth daily. 90 tablet 3   Fish Oil-Vitamin D  1000-1000 MG-UNIT CAPS Take 1 capsule by mouth daily.     furosemide  (LASIX ) 40 MG tablet Take 1 tablet (40 mg total) by mouth daily. 30 tablet 11   gabapentin  (NEURONTIN ) 300 MG capsule Take 1 capsule (300 mg total) by mouth 3 (three) times daily. 270 capsule 1   insulin  aspart (NOVOLOG ) 100 UNIT/ML FlexPen Inject 10-16 Units into the skin 3 (three) times daily with meals. 45 mL 3   insulin  degludec (  TRESIBA  FLEXTOUCH) 100 UNIT/ML FlexTouch Pen Inject 25 Units into the skin at bedtime. 25 mL 3   isosorbide  mononitrate (IMDUR ) 60 MG 24 hr tablet Take 1 tablet (60 mg total) by mouth daily. 90 tablet 1   levothyroxine  (SYNTHROID ) 88 MCG tablet Take 1 tablet (88 mcg total) by mouth daily before breakfast. 90 tablet 3   metoprolol  succinate (TOPROL -XL) 25 MG 24 hr tablet Take 1 tablet (25 mg total) by mouth daily. 30 tablet 11   spironolactone  (ALDACTONE ) 25 MG tablet Take 1 tablet (25 mg total) by mouth daily. 30 tablet 3   No current facility-administered medications for this visit.    Allergies:   Plavix [clopidogrel]    Social History:   reports that he has quit smoking. He has never used smokeless tobacco. He  reports that he does not drink alcohol and does not use drugs.   Family History:  family history includes Cancer in his mother; Diabetes in his brother and father; Hypertension in his father.    ROS:     Review of Systems  Constitutional: Negative.   HENT: Negative.    Eyes: Negative.   Respiratory: Negative.    Gastrointestinal: Negative.   Genitourinary: Negative.   Musculoskeletal: Negative.   Skin: Negative.   Neurological: Negative.   Endo/Heme/Allergies: Negative.   Psychiatric/Behavioral: Negative.    All other systems reviewed and are negative.     All other systems are reviewed and negative.    PHYSICAL EXAM: VS:  BP 122/76   Pulse 85   Ht 6' 6 (1.981 m)   Wt 239 lb 12.8 oz (108.8 kg)   SpO2 96%   BMI 27.71 kg/m  , BMI Body mass index is 27.71 kg/m. Last weight:  Wt Readings from Last 3 Encounters:  12/25/23 239 lb 12.8 oz (108.8 kg)  11/07/23 244 lb (110.7 kg)  10/23/23 241 lb 6.4 oz (109.5 kg)     Physical Exam Vitals reviewed.  Constitutional:      Appearance: Normal appearance. He is normal weight.  HENT:     Head: Normocephalic.     Nose: Nose normal.     Mouth/Throat:     Mouth: Mucous membranes are moist.  Eyes:     Pupils: Pupils are equal, round, and reactive to light.  Cardiovascular:     Rate and Rhythm: Normal rate and regular rhythm.     Pulses: Normal pulses.     Heart sounds: Normal heart sounds.  Pulmonary:     Effort: Pulmonary effort is normal.  Abdominal:     General: Abdomen is flat. Bowel sounds are normal.  Musculoskeletal:        General: Normal range of motion.     Cervical back: Normal range of motion.  Skin:    General: Skin is warm.  Neurological:     General: No focal deficit present.     Mental Status: He is alert.  Psychiatric:        Mood and Affect: Mood normal.       EKG:   Recent Labs: 02/02/2023: Hemoglobin 10.3; Platelets 152 04/20/2023: ALT 26; BUN 27; Creatinine, Ser 1.92; Potassium 4.3;  Sodium 139 10/31/2023: TSH 1.750    Lipid Panel    Component Value Date/Time   CHOL 126 08/21/2022 0502   CHOL 125 05/10/2022 0917   TRIG 74 08/21/2022 0502   HDL 34 (L) 08/21/2022 0502   HDL 46 05/10/2022 0917   CHOLHDL 3.7 08/21/2022 0502   VLDL 15  08/21/2022 0502   LDLCALC 77 08/21/2022 0502   LDLCALC 65 05/10/2022 0917   LDLCALC 73 11/03/2020 1032      Other studies Reviewed: Additional studies/ records that were reviewed today include:  Review of the above records demonstrates:       No data to display            ASSESSMENT AND PLAN:    ICD-10-CM   1. Bilateral lower extremity edema  R60.0 spironolactone  (ALDACTONE ) 25 MG tablet    PCV ECHOCARDIOGRAM COMPLETE   Its better now    2. Mixed hyperlipidemia  E78.2 spironolactone  (ALDACTONE ) 25 MG tablet    PCV ECHOCARDIOGRAM COMPLETE    3. Coronary artery disease of native artery of native heart with stable angina pectoris  I25.118 spironolactone  (ALDACTONE ) 25 MG tablet    PCV ECHOCARDIOGRAM COMPLETE    4. Chronic diastolic CHF (congestive heart failure) (HCC)  I50.32 spironolactone  (ALDACTONE ) 25 MG tablet    PCV ECHOCARDIOGRAM COMPLETE    5. SOB (shortness of breath)  R06.02 spironolactone  (ALDACTONE ) 25 MG tablet    PCV ECHOCARDIOGRAM COMPLETE    6. Atrial fibrillation, currently in sinus rhythm  Z86.79 spironolactone  (ALDACTONE ) 25 MG tablet    PCV ECHOCARDIOGRAM COMPLETE    7. Nonrheumatic mitral valve regurgitation  I34.0 spironolactone  (ALDACTONE ) 25 MG tablet    PCV ECHOCARDIOGRAM COMPLETE    8. Nonrheumatic aortic valve insufficiency  I35.1 spironolactone  (ALDACTONE ) 25 MG tablet    PCV ECHOCARDIOGRAM COMPLETE       Problem List Items Addressed This Visit       Cardiovascular and Mediastinum   Coronary artery disease of native artery of native heart with stable angina pectoris   Relevant Medications   spironolactone  (ALDACTONE ) 25 MG tablet   Other Relevant Orders   PCV ECHOCARDIOGRAM  COMPLETE   Chronic diastolic CHF (congestive heart failure) (HCC)   Relevant Medications   spironolactone  (ALDACTONE ) 25 MG tablet   Other Relevant Orders   PCV ECHOCARDIOGRAM COMPLETE     Other   Hyperlipidemia   Relevant Medications   spironolactone  (ALDACTONE ) 25 MG tablet   Other Relevant Orders   PCV ECHOCARDIOGRAM COMPLETE   Bilateral lower extremity edema - Primary   Relevant Medications   spironolactone  (ALDACTONE ) 25 MG tablet   Other Relevant Orders   PCV ECHOCARDIOGRAM COMPLETE   SOB (shortness of breath)   Relevant Medications   spironolactone  (ALDACTONE ) 25 MG tablet   Other Relevant Orders   PCV ECHOCARDIOGRAM COMPLETE   Other Visit Diagnoses       Atrial fibrillation, currently in sinus rhythm       Relevant Medications   spironolactone  (ALDACTONE ) 25 MG tablet   Other Relevant Orders   PCV ECHOCARDIOGRAM COMPLETE     Nonrheumatic mitral valve regurgitation       Relevant Medications   spironolactone  (ALDACTONE ) 25 MG tablet   Other Relevant Orders   PCV ECHOCARDIOGRAM COMPLETE     Nonrheumatic aortic valve insufficiency       Relevant Medications   spironolactone  (ALDACTONE ) 25 MG tablet   Other Relevant Orders   PCV ECHOCARDIOGRAM COMPLETE          Disposition:   Return in about 3 months (around 03/24/2024) for echo and f/u.    Total time spent: 30 minutes  Signed,  Denyse Bathe, MD  12/25/2023 11:15 AM    [facility]

## 2023-12-31 ENCOUNTER — Other Ambulatory Visit (HOSPITAL_COMMUNITY): Payer: Self-pay

## 2023-12-31 ENCOUNTER — Other Ambulatory Visit: Payer: Self-pay

## 2024-01-09 ENCOUNTER — Other Ambulatory Visit (HOSPITAL_COMMUNITY): Payer: Self-pay

## 2024-01-14 ENCOUNTER — Other Ambulatory Visit (HOSPITAL_COMMUNITY): Payer: Self-pay

## 2024-01-15 ENCOUNTER — Other Ambulatory Visit (HOSPITAL_COMMUNITY): Payer: Self-pay

## 2024-01-16 ENCOUNTER — Other Ambulatory Visit: Payer: Self-pay

## 2024-02-06 ENCOUNTER — Other Ambulatory Visit (HOSPITAL_COMMUNITY): Payer: Self-pay

## 2024-02-06 ENCOUNTER — Other Ambulatory Visit: Payer: Self-pay

## 2024-02-06 ENCOUNTER — Other Ambulatory Visit: Payer: Self-pay | Admitting: Nurse Practitioner

## 2024-02-06 DIAGNOSIS — Z794 Long term (current) use of insulin: Secondary | ICD-10-CM

## 2024-02-06 DIAGNOSIS — E1142 Type 2 diabetes mellitus with diabetic polyneuropathy: Secondary | ICD-10-CM

## 2024-02-06 DIAGNOSIS — N1832 Chronic kidney disease, stage 3b: Secondary | ICD-10-CM

## 2024-02-06 DIAGNOSIS — N1831 Chronic kidney disease, stage 3a: Secondary | ICD-10-CM

## 2024-02-06 MED ORDER — FREESTYLE LIBRE 3 READER DEVI
0 refills | Status: AC
  Filled 2024-02-06: qty 1, 30d supply, fill #0

## 2024-02-09 ENCOUNTER — Other Ambulatory Visit (HOSPITAL_COMMUNITY): Payer: Self-pay

## 2024-02-10 ENCOUNTER — Other Ambulatory Visit (HOSPITAL_COMMUNITY): Payer: Self-pay

## 2024-02-12 ENCOUNTER — Ambulatory Visit: Admitting: Family Medicine

## 2024-02-13 ENCOUNTER — Other Ambulatory Visit: Payer: Self-pay

## 2024-02-13 ENCOUNTER — Other Ambulatory Visit (HOSPITAL_COMMUNITY): Payer: Self-pay

## 2024-02-13 ENCOUNTER — Encounter: Payer: Self-pay | Admitting: Family Medicine

## 2024-02-13 ENCOUNTER — Ambulatory Visit: Admitting: Family Medicine

## 2024-02-13 VITALS — BP 96/68 | HR 73 | Ht 78.0 in | Wt 237.0 lb

## 2024-02-13 DIAGNOSIS — I69351 Hemiplegia and hemiparesis following cerebral infarction affecting right dominant side: Secondary | ICD-10-CM

## 2024-02-13 DIAGNOSIS — E1122 Type 2 diabetes mellitus with diabetic chronic kidney disease: Secondary | ICD-10-CM

## 2024-02-13 DIAGNOSIS — I509 Heart failure, unspecified: Secondary | ICD-10-CM

## 2024-02-13 DIAGNOSIS — I4891 Unspecified atrial fibrillation: Secondary | ICD-10-CM

## 2024-02-13 DIAGNOSIS — I130 Hypertensive heart and chronic kidney disease with heart failure and stage 1 through stage 4 chronic kidney disease, or unspecified chronic kidney disease: Secondary | ICD-10-CM

## 2024-02-13 DIAGNOSIS — Z794 Long term (current) use of insulin: Secondary | ICD-10-CM

## 2024-02-13 DIAGNOSIS — Z7689 Persons encountering health services in other specified circumstances: Secondary | ICD-10-CM

## 2024-02-13 DIAGNOSIS — E782 Mixed hyperlipidemia: Secondary | ICD-10-CM

## 2024-02-13 DIAGNOSIS — I10 Essential (primary) hypertension: Secondary | ICD-10-CM

## 2024-02-13 DIAGNOSIS — Z23 Encounter for immunization: Secondary | ICD-10-CM

## 2024-02-13 DIAGNOSIS — E1142 Type 2 diabetes mellitus with diabetic polyneuropathy: Secondary | ICD-10-CM

## 2024-02-13 DIAGNOSIS — N1832 Chronic kidney disease, stage 3b: Secondary | ICD-10-CM

## 2024-02-13 DIAGNOSIS — E039 Hypothyroidism, unspecified: Secondary | ICD-10-CM

## 2024-02-13 MED ORDER — FREESTYLE LIBRE 3 PLUS SENSOR MISC
3 refills | Status: AC
  Filled 2024-02-13: qty 6, 90d supply, fill #0

## 2024-02-13 NOTE — Progress Notes (Signed)
 "  Patient Office Visit  Assessment & Plan:  Encounter to establish care  Type 2 diabetes mellitus with stage 3b chronic kidney disease, with long-term current use of insulin  (HCC) -     FreeStyle Libre 3 Plus Sensor; Change sensor every 15 days to monitor blood glucose readings as directed by provider.  Dispense: 6 each; Refill: 3 -     Hemoglobin A1c  Current use of insulin  (HCC) -     FreeStyle Libre 3 Plus Sensor; Change sensor every 15 days to monitor blood glucose readings as directed by provider.  Dispense: 6 each; Refill: 3  Diabetic polyneuropathy associated with type 2 diabetes mellitus (HCC) -     FreeStyle Libre 3 Plus Sensor; Change sensor every 15 days to monitor blood glucose readings as directed by provider.  Dispense: 6 each; Refill: 3  Mixed hyperlipidemia -     Lipid panel  Essential hypertension -     CBC with Differential/Platelet -     Comprehensive metabolic panel with GFR  Hypothyroidism, unspecified type -     TSH -     VITAMIN D  25 Hydroxy (Vit-D Deficiency, Fractures)  Atrial fibrillation, unspecified type (HCC)  Needs flu shot -     Flu vaccine HIGH DOSE PF(Fluzone Trivalent)   Assessment and Plan    Type 2 diabetes mellitus with diabetic chronic kidney disease and diabetic polyneuropathy Long-standing diabetes with fluctuating glucose levels, managed with insulin . Chronic kidney disease stable. Neuropathy managed with gabapentin , causing sedation. - Sent glucose sensor prescription to [facility]. - Continue insulin  therapy. - Continue gabapentin  for neuropathy.  Atrial fibrillation Chronic atrial fibrillation managed with Eliquis . - Continue Eliquis  for atrial fibrillation.  Heart failure Shortness of breath with exertion and right foot swelling, possibly related to heart failure. Oxygen saturation at 99%. - Monitor weight and swelling, especially in the right foot.  Hemiplegia following cerebral infarction, right dominant  side Residual right-sided weakness post-stroke, uses walker for mobility. Some improvement noted. - Continue using walker for mobility support.  General health maintenance Flu shot not received this season. Eye exams current. No recent colonoscopy due to age. - Administered flu shot today. - Continue annual eye exams. - Encouraged healthy diet and exercise as tolerated.     Test results were reviewed and analyzed as part of the medical decision making of this visit.  Reviewed previous notes and labs from [facility]. Patient aware to avoid NSAIDs due to CKD Stage 3B High Dose flu shot given today.  Return in about 3 months (around 05/12/2024), or if symptoms worsen or fail to improve.   Subjective:    Patient ID: Dylan Rosina Glendia Mickey., male    DOB: Mar 10, 1940  Age: 84 y.o. MRN: [id number]  Chief Complaint  Patient presents with   Medical Management of Chronic Issues   Establish Care    Pt has a sore on his 2nd toe on the R foot. He is diabetic so pt wife is concerned.     HPI Discussed the use of AI scribe software for clinical note transcription with the patient, who gave verbal consent to proceed.  History of Present Illness       Dylan Nyland Dylan Crescenzo. is an 84 year old male with type 2 diabetes, atrial fibrillation, and a history of stroke who presents to establish primary care in our office.  He is accompanied by his wife.  He has a long-standing history of type 2 diabetes managed with insulin . Patient  does see Endocrinology re this. Blood sugar levels fluctuate, with some readings being high and others dropping, but not below 90 mg/dL. He previously used metformin  but discontinued it due to kidney issues. He monitors his diet, occasionally consuming fried foods, but also includes baked and rotisserie chicken in his meals.  Approximately two years ago, he experienced a mild stroke, resulting in the use of a walker for mobility. Weakness is primarily on the right  side, which has improved slightly but has not returned to baseline. He uses the walker consistently and has not reported any recent falls. He engages in some physical activity around the house.  He has atrial fibrillation and is on Eliquis , a blood thinner, since his stroke. He also has a history of heart disease and attempted stent placement, which was unsuccessful due to vein issues. He experiences shortness of breath, particularly during activities like dressing, and has noted swelling in his right foot.  He has diabetic peripheral neuropathy, experiencing numbness and tingling in his legs, which is managed with gabapentin . The medication helps with symptoms but causes drowsiness. He has been on gabapentin  for a long time.  CKD Stage 3B/Stage IV- patient has history of chronic kidney disease which has been stable per patient and wife. He has a history of kidney problems and sees a nephrologist regularly. He does not take NSAIDs like Advil or Aleve.  He has dry skin/stasis dermatitis, particularly on his legs, and a recent issue with his second toe, which appeared a few days ago. Patient does see Podiatry regularly and has upcoming appt.   He has a history of smoking but quit 60 years ago. He does not consume alcohol or use illicit drugs[place], which he enjoys for exercise.  He has a family history of prostate cancer in his brother. He has no children and limited family support, but he has a good support system through friends and church. Patient also has prostate cancer and sees Dr. Sherrilee every 6 months.   Physical Exam VITALS: SaO2- 99% EXTREMITIES: Right ankle swollen. SKIN: Stasis dermatitis present. Skin thin.  Assessment and Plan Type 2 diabetes mellitus with diabetic chronic kidney disease and diabetic polyneuropathy Long-standing diabetes with fluctuating glucose levels, managed with insulin . Chronic kidney disease stable.  Neuropathy managed with gabapentin , causing sedation. - Sent glucose sensor prescription to [facility]. - Continue insulin  therapy. - Continue gabapentin  for neuropathy.  Atrial fibrillation Chronic atrial fibrillation managed with Eliquis . - Continue Eliquis  for atrial fibrillation.  Heart failure Shortness of breath with exertion and right foot swelling, possibly related to heart failure. Oxygen saturation at 99%. - Monitor weight and swelling, especially in the right foot. CKD Stage 3B- patient sees Nephrology every few months. Kidney function has been stable.  Right sided Hemiplegia following cerebral infarction, right dominant side Residual right-sided weakness post-stroke, uses walker for mobility. Some improvement noted. - Continue using walker for mobility support.  General health maintenance Flu shot not received this season. Eye exams current. No recent colonoscopy due to age. - Administered flu shot today. - Continue annual eye exams. - Encouraged healthy diet and exercise as tolerated.    The ASCVD Risk score (Arnett DK, et al., 2019) failed to calculate for the following reasons:   The 2019 ASCVD risk score is only valid for ages 30 to 71   Risk score cannot be calculated because patient has a medical history suggesting prior/existing ASCVD   * -  Cholesterol units were assumed  Past Medical History:  Diagnosis Date   Acute lower UTI 09/20/2017   Bradycardia on ECG 07/31/2011   Coronary artery disease    hyperlipidemia   Decreased GFR 09/23/2019   Dehydration 09/21/2017   Diabetes mellitus    Diabetes mellitus without complication (HCC)    Hypercholesterolemia    Hypertension    Hypothyroidism    Left arm pain 01/29/2020   Paronychia of great toe of left foot 09/26/2018   Paronychia of great toe, left 09/26/2018   PERIPHERAL NEUROPATHY 01/13/2006   Qualifier: Diagnosis of  By: Tamra MD, Christine     Shortness of breath dyspnea    with exertion    Sleep apnea    Stroke (HCC) 08/10/2022   Vasovagal syncope    Weakness 09/20/2017   Past Surgical History:  Procedure Laterality Date   APPENDECTOMY     CARDIAC CATHETERIZATION N/A 06/16/2014   Procedure: Left Heart Cath;  Surgeon: Denyse DELENA Bathe, MD;  Location: [facility] INVASIVE CV LAB;  Service: Cardiovascular;  Laterality: N/A;   CIRCUMCISION, NON-NEWBORN     CYSTOSCOPY N/A 02/01/2023   Procedure: CYSTOSCOPY;  Surgeon: Sherrilee Belvie CROME, MD;  Location: [facility];  Service: Urology;  Laterality: N/A;   HERNIA REPAIR     LOWER EXTREMITY ANGIOGRAPHY Left 12/24/2018   Procedure: LOWER EXTREMITY ANGIOGRAPHY;  Surgeon: Jama Cordella MATSU, MD;  Location: [facility] INVASIVE CV LAB;  Service: Cardiovascular;  Laterality: Left;   THYROID  SURGERY     TRANSURETHRAL RESECTION OF PROSTATE N/A 02/01/2023   Procedure: TRANSURETHRAL RESECTION OF THE PROSTATE (TURP);  Surgeon: Sherrilee Belvie CROME, MD;  Location: [facility];  Service: Urology;  Laterality: N/A;   Social History[1] Family History  Problem Relation Age of Onset   Cancer Mother        pt is unsure of what type of cancer   Diabetes Father    Hypertension Father    Diabetes Brother    Prostate cancer Brother    Allergies[2]  ROS    Objective:    BP 96/68   Pulse 73   Ht 6' 6 (1.981 m)   Wt 237 lb (107.5 kg)   SpO2 99%   BMI 27.39 kg/m  BP Readings from Last 3 Encounters:  02/13/24 96/68  12/25/23 122/76  11/14/23 120/65   Wt Readings from Last 3 Encounters:  02/13/24 237 lb (107.5 kg)  12/25/23 239 lb 12.8 oz (108.8 kg)  11/07/23 244 lb (110.7 kg)    Physical Exam Vitals and nursing note reviewed.  Constitutional:      General: He is not in acute distress.    Appearance: Normal appearance.     Comments: Using walker, comes in with his wife  HENT:     Head: Normocephalic.     Right Ear: Tympanic membrane, ear canal and external ear normal.     Left Ear: Tympanic membrane, ear canal and external ear normal.  Eyes:      Extraocular Movements: Extraocular movements intact.     Conjunctiva/sclera: Conjunctivae normal.     Pupils: Pupils are equal, round, and reactive to light.  Cardiovascular:     Rate and Rhythm: Normal rate and regular rhythm.     Heart sounds: Normal heart sounds.  Pulmonary:     Effort: Pulmonary effort is normal.     Breath sounds: Normal breath sounds. No wheezing or rhonchi.  Musculoskeletal:     Right lower leg: Edema present.     Left lower  leg: No edema.  Skin:    Findings: Rash present.     Comments: Stasis dermatitis over both legs, right leg worse, patient has tiny superficial ulcer area over 2nd toe, no bleeding or purulence noted.   Neurological:     General: No focal deficit present.     Mental Status: He is alert and oriented to person, place, and time.  Psychiatric:        Mood and Affect: Mood normal.        Behavior: Behavior normal.      No results found for any visits on 02/13/24.          [1]  Social History Tobacco Use   Smoking status: Former   Smokeless tobacco: Never  Vaping Use   Vaping status: Never Used  Substance Use Topics   Alcohol use: No   Drug use: No  [2]  Allergies Allergen Reactions   Plavix [Clopidogrel] Hives   "

## 2024-02-14 ENCOUNTER — Ambulatory Visit: Admitting: Nurse Practitioner

## 2024-02-14 LAB — COMPREHENSIVE METABOLIC PANEL WITH GFR
AG Ratio: 1.5 (calc) (ref 1.0–2.5)
ALT: 25 U/L (ref 9–46)
AST: 20 U/L (ref 10–35)
Albumin: 4.2 g/dL (ref 3.6–5.1)
Alkaline phosphatase (APISO): 145 U/L — ABNORMAL HIGH (ref 35–144)
BUN/Creatinine Ratio: 17 (calc) (ref 6–22)
BUN: 47 mg/dL — ABNORMAL HIGH (ref 7–25)
CO2: 26 mmol/L (ref 20–32)
Calcium: 10.3 mg/dL (ref 8.6–10.3)
Chloride: 101 mmol/L (ref 98–110)
Creat: 2.83 mg/dL — ABNORMAL HIGH (ref 0.70–1.22)
Globulin: 2.8 g/dL (ref 1.9–3.7)
Glucose, Bld: 195 mg/dL — ABNORMAL HIGH (ref 65–99)
Potassium: 5 mmol/L (ref 3.5–5.3)
Sodium: 137 mmol/L (ref 135–146)
Total Bilirubin: 0.8 mg/dL (ref 0.2–1.2)
Total Protein: 7 g/dL (ref 6.1–8.1)
eGFR: 21 mL/min/{1.73_m2} — ABNORMAL LOW

## 2024-02-14 LAB — CBC WITH DIFFERENTIAL/PLATELET
Absolute Lymphocytes: 2236 {cells}/uL (ref 850–3900)
Absolute Monocytes: 764 {cells}/uL (ref 200–950)
Basophils Absolute: 37 {cells}/uL (ref 0–200)
Basophils Relative: 0.4 %
Eosinophils Absolute: 212 {cells}/uL (ref 15–500)
Eosinophils Relative: 2.3 %
HCT: 41 % (ref 39.4–51.1)
Hemoglobin: 13.2 g/dL (ref 13.2–17.1)
MCH: 29.6 pg (ref 27.0–33.0)
MCHC: 32.2 g/dL (ref 31.6–35.4)
MCV: 91.9 fL (ref 81.4–101.7)
MPV: 11.2 fL (ref 7.5–12.5)
Monocytes Relative: 8.3 %
Neutro Abs: 5952 {cells}/uL (ref 1500–7800)
Neutrophils Relative %: 64.7 %
Platelets: 218 10*3/uL (ref 140–400)
RBC: 4.46 Million/uL (ref 4.20–5.80)
RDW: 13.2 % (ref 11.0–15.0)
Total Lymphocyte: 24.3 %
WBC: 9.2 10*3/uL (ref 3.8–10.8)

## 2024-02-14 LAB — TSH: TSH: 2.46 m[IU]/L (ref 0.40–4.50)

## 2024-02-14 LAB — HEMOGLOBIN A1C
Hgb A1c MFr Bld: 9.6 % — ABNORMAL HIGH
Mean Plasma Glucose: 229 mg/dL
eAG (mmol/L): 12.7 mmol/L

## 2024-02-14 LAB — LIPID PANEL
Cholesterol: 123 mg/dL
HDL: 36 mg/dL — ABNORMAL LOW
LDL Cholesterol (Calc): 66 mg/dL
Non-HDL Cholesterol (Calc): 87 mg/dL
Total CHOL/HDL Ratio: 3.4 (calc)
Triglycerides: 124 mg/dL

## 2024-02-14 LAB — VITAMIN D 25 HYDROXY (VIT D DEFICIENCY, FRACTURES): Vit D, 25-Hydroxy: 48 ng/mL (ref 30–100)

## 2024-02-15 ENCOUNTER — Emergency Department (HOSPITAL_COMMUNITY): Admission: EM | Admit: 2024-02-15 | Source: Home / Self Care

## 2024-02-15 ENCOUNTER — Ambulatory Visit: Payer: Self-pay | Admitting: Family Medicine

## 2024-02-15 ENCOUNTER — Ambulatory Visit: Payer: Self-pay

## 2024-02-15 LAB — POTASSIUM: Potassium: 5.5 mmol/L — ABNORMAL HIGH (ref 3.5–5.1)

## 2024-02-15 LAB — CBC
HCT: 42.2 % (ref 39.0–52.0)
Hemoglobin: 13.2 g/dL (ref 13.0–17.0)
MCH: 29.5 pg (ref 26.0–34.0)
MCHC: 31.3 g/dL (ref 30.0–36.0)
MCV: 94.2 fL (ref 80.0–100.0)
Platelets: 215 10*3/uL (ref 150–400)
RBC: 4.48 MIL/uL (ref 4.22–5.81)
RDW: 14.7 % (ref 11.5–15.5)
WBC: 8.4 10*3/uL (ref 4.0–10.5)
nRBC: 0 % (ref 0.0–0.2)

## 2024-02-15 LAB — URINALYSIS, ROUTINE W REFLEX MICROSCOPIC
Bilirubin Urine: NEGATIVE
Glucose, UA: NEGATIVE mg/dL
Hgb urine dipstick: NEGATIVE
Ketones, ur: NEGATIVE mg/dL
Leukocytes,Ua: NEGATIVE
Nitrite: NEGATIVE
Protein, ur: NEGATIVE mg/dL
Specific Gravity, Urine: 1.013 (ref 1.005–1.030)
pH: 5 (ref 5.0–8.0)

## 2024-02-15 LAB — COMPREHENSIVE METABOLIC PANEL WITH GFR
ALT: 26 U/L (ref 0–44)
AST: 26 U/L (ref 15–41)
Albumin: 4.1 g/dL (ref 3.5–5.0)
Alkaline Phosphatase: 172 U/L — ABNORMAL HIGH (ref 38–126)
Anion gap: 14 (ref 5–15)
BUN: 48 mg/dL — ABNORMAL HIGH (ref 8–23)
CO2: 24 mmol/L (ref 22–32)
Calcium: 10.5 mg/dL — ABNORMAL HIGH (ref 8.9–10.3)
Chloride: 101 mmol/L (ref 98–111)
Creatinine, Ser: 2.52 mg/dL — ABNORMAL HIGH (ref 0.61–1.24)
GFR, Estimated: 24 mL/min — ABNORMAL LOW
Glucose, Bld: 171 mg/dL — ABNORMAL HIGH (ref 70–99)
Potassium: 5.5 mmol/L — ABNORMAL HIGH (ref 3.5–5.1)
Sodium: 140 mmol/L (ref 135–145)
Total Bilirubin: 0.8 mg/dL (ref 0.0–1.2)
Total Protein: 7.6 g/dL (ref 6.5–8.1)

## 2024-02-15 MED ORDER — SODIUM CHLORIDE 0.9 % IV BOLUS
1000.0000 mL | Freq: Once | INTRAVENOUS | Status: AC
  Administered 2024-02-15: 1000 mL via INTRAVENOUS

## 2024-02-15 MED ORDER — SODIUM ZIRCONIUM CYCLOSILICATE 5 G PO PACK
5.0000 g | PACK | Freq: Once | ORAL | Status: AC
  Administered 2024-02-15: 5 g via ORAL
  Filled 2024-02-15: qty 1

## 2024-02-15 MED ORDER — SODIUM ZIRCONIUM CYCLOSILICATE 5 G PO PACK
10.0000 g | PACK | Freq: Once | ORAL | Status: AC
  Administered 2024-02-15: 10 g via ORAL
  Filled 2024-02-15: qty 2

## 2024-02-15 NOTE — Progress Notes (Signed)
 Annett Porteous, RN    02/15/24  9:15 AM Note FYI Only or Action Required?: FYI only for provider: ED advised.   Patient was last seen in primary care on 02/13/2024 by Aletha Bene, MD.   Called Nurse Triage reporting Results.       Triage Disposition: Information or Advice Only Call   Patient/caregiver understands and will follow disposition?: Yes       Reason for Disposition  General information question, no triage required and triager able to answer question  Answer Assessment - Initial Assessment Questions 1. REASON FOR CALL: What is the main reason for your call? or How can I best help you?   PCP left a message on MyChart for patient to go to ER for kidney failure eval. Pt's wife called in for further understanding of ER advice.  Informed patient's wife of the message PCP sent and next steps. Pt's wife advised to have patient go to the nearest ER for further evaluation today rather than waiting. Pt's wife acknowledged understanding.  Protocols used: Information Only Call - No Triage-A-AH

## 2024-02-15 NOTE — ED Provider Notes (Incomplete)
 " [place] EMERGENCY DEPARTMENT AT [facility] Provider Note   CSN: [id number] Arrival date & time: 02/15/24  1047     Patient presents with: abnromal labs (Kidney function)   Dylan Robles. is a 84 y.o. male.  {Add pertinent medical, surgical, social history, OB history to YEP:67052} The history is provided by the patient.       Prior to Admission medications  Medication Sig Start Date End Date Taking? Authorizing Provider  apixaban  (ELIQUIS ) 2.5 MG TABS tablet Take 1 tablet (2.5 mg total) by mouth 2 (two) times daily. 12/21/23   Fernand Denyse LABOR, MD  atorvastatin  (LIPITOR) 80 MG tablet Take 1 tablet (80 mg total) by mouth daily. 11/12/23   Fernand Denyse LABOR, MD  cetirizine  (ZYRTEC ) 5 MG tablet Take 1 tablet (5 mg total) by mouth daily. 11/09/23   Bevely Doffing, FNP  Continuous Glucose Receiver (FREESTYLE LIBRE 3 READER) DEVI Use to monitor glucose continuously as directed 02/06/24   Therisa Benton PARAS, NP  Continuous Glucose Sensor (FREESTYLE LIBRE 3 PLUS SENSOR) MISC Change sensor every 15 days to monitor blood glucose readings as directed by provider. 02/13/24   Aletha Bene, MD  ezetimibe  (ZETIA ) 10 MG tablet Take 1 tablet (10 mg total) by mouth daily. 11/12/23   Fernand Denyse LABOR, MD  Fish Oil-Vitamin D  1000-1000 MG-UNIT CAPS Take 1 capsule by mouth daily.    [provider]  furosemide  (LASIX ) 40 MG tablet Take 1 tablet (40 mg total) by mouth daily. 06/19/23 06/18/24  Fernand Denyse LABOR, MD  gabapentin  (NEURONTIN ) 300 MG capsule Take 1 capsule (300 mg total) by mouth 3 (three) times daily. 10/17/23   Therisa Benton PARAS, NP  insulin  aspart (NOVOLOG ) 100 UNIT/ML FlexPen Inject 10-16 Units into the skin 3 (three) times daily with meals. 11/07/23   Therisa Benton PARAS, NP  insulin  degludec (TRESIBA  FLEXTOUCH) 100 UNIT/ML FlexTouch Pen Inject 25 Units into the skin at bedtime. 11/07/23   Therisa Benton PARAS, NP  isosorbide  mononitrate (IMDUR ) 60 MG 24 hr tablet Take 1  tablet (60 mg total) by mouth daily. 09/04/23   Bevely Doffing, FNP  levothyroxine  (SYNTHROID ) 88 MCG tablet Take 1 tablet (88 mcg total) by mouth daily before breakfast. 11/07/23   Therisa Benton PARAS, NP  metoprolol  succinate (TOPROL -XL) 25 MG 24 hr tablet Take 1 tablet (25 mg total) by mouth daily. 10/12/23   Fernand Denyse LABOR, MD  spironolactone  (ALDACTONE ) 25 MG tablet Take 1 tablet (25 mg total) by mouth daily. 12/25/23   Fernand Denyse LABOR, MD    Allergies: Plavix [clopidogrel]    Review of Systems  Updated Vital Signs BP (!) 146/85 (BP Location: Right Arm)   Pulse 73   Temp 97.6 F (36.4 C) (Oral)   Resp 17   Ht 6' 6 (1.981 m)   Wt 107.5 kg   SpO2 100%   BMI 27.39 kg/m   Physical Exam  (all labs ordered are listed, but only abnormal results are displayed) Labs Reviewed  COMPREHENSIVE METABOLIC PANEL WITH GFR - Abnormal; Notable for the following components:      Result Value   Potassium 5.5 (*)    Glucose, Bld 171 (*)    BUN 48 (*)    Creatinine, Ser 2.52 (*)    Calcium  10.5 (*)    Alkaline Phosphatase 172 (*)    GFR, Estimated 24 (*)    All other components within normal limits  URINALYSIS, ROUTINE W REFLEX MICROSCOPIC  CBC  POTASSIUM  EKG: None  Radiology: No results found.  {Document cardiac monitor, telemetry assessment procedure when appropriate:32947} Procedures   Medications Ordered in the ED  sodium chloride  0.9 % bolus 1,000 mL (1,000 mLs Intravenous New Bag/Given 02/15/24 1809)  sodium zirconium cyclosilicate  (LOKELMA ) packet 5 g (5 g Oral Given 02/15/24 1759)      {Click here for ABCD2, HEART and other calculators REFRESH Note before signing:1}                              Medical Decision Making Amount and/or Complexity of Data Reviewed Labs: ordered.  Risk Prescription drug management.   ***  {Document critical care time when appropriate  Document review of labs and clinical decision tools ie CHADS2VASC2, etc  Document your independent  review of radiology images and any outside records  Document your discussion with family members, caretakers and with consultants  Document social determinants of health affecting pt's care  Document your decision making why or why not admission, treatments were needed:32947:::1}   Final diagnoses:  None    ED Discharge Orders     None        "

## 2024-02-15 NOTE — ED Triage Notes (Signed)
 Pt comes in for abnormal labs of kidney function. PCP told pt to come to ED to be checked out. A&Ox4.   Pt noticed a decreased out and bilateral flank pain. Pain comes and goes.

## 2024-02-15 NOTE — Telephone Encounter (Signed)
 FYI Only or Action Required?: FYI only for provider: ED advised.  Patient was last seen in primary care on 02/13/2024 by Aletha Bene, MD.  Called Nurse Triage reporting Results.     Triage Disposition: Information or Advice Only Call  Patient/caregiver understands and will follow disposition?: Yes    Reason for Disposition  General information question, no triage required and triager able to answer question  Answer Assessment - Initial Assessment Questions 1. REASON FOR CALL: What is the main reason for your call? or How can I best help you?   PCP left a message on MyChart for patient to go to ER for kidney failure eval. Pt's wife called in for further understanding of ER advice.  Informed patient's wife of the message PCP sent and next steps. Pt's wife advised to have patient go to the nearest ER for further evaluation today rather than waiting. Pt's wife acknowledged understanding.  Protocols used: Information Only Call - No Triage-A-AH

## 2024-02-15 NOTE — ED Provider Notes (Incomplete)
 " Dylan Robles Provider Note   CSN: [id number] Arrival date & time: 02/15/24  1047     Patient presents with: Abnormal Lab   Dylan Robles. is a 84 y.o. male with a history including type 2 diabetes, CAD, history of CVA, hypertension, and with known renal insufficiency stage IIIa kidney disease under the care of Dr. Rachele, was seen by his new PCP 2 days ago and sent here for further evaluation secondary to an elevation in his creatinine which was 2.83 with a GFR of 21.  Prior to this his creatinine was 1.92 10 months ago.  He has had more recent kidney function tests which are not available in epic unfortunately.  Patient denies any increased peripheral edema, he has no escalating shortness of breath, no orthopnea.  He denies chest pain, palpitations.  He has noted some pressure across to his lower back which seems to be triggered by movement suggesting possible musculoskeletal source.  No CVA tenderness.  No urinary complaints.  {Add pertinent medical, surgical, social history, OB history to YEP:67052} The history is provided by the patient.       Prior to Admission medications  Medication Sig Start Date End Date Taking? Authorizing Provider  apixaban  (ELIQUIS ) 2.5 MG TABS tablet Take 1 tablet (2.5 mg total) by mouth 2 (two) times daily. 12/21/23   Fernand Denyse LABOR, MD  atorvastatin  (LIPITOR) 80 MG tablet Take 1 tablet (80 mg total) by mouth daily. 11/12/23   Fernand Denyse LABOR, MD  cetirizine  (ZYRTEC ) 5 MG tablet Take 1 tablet (5 mg total) by mouth daily. 11/09/23   Bevely Doffing, FNP  Continuous Glucose Receiver (FREESTYLE LIBRE 3 READER) DEVI Use to monitor glucose continuously as directed 02/06/24   Therisa Benton PARAS, NP  Continuous Glucose Sensor (FREESTYLE LIBRE 3 PLUS SENSOR) MISC Change sensor every 15 days to monitor blood glucose readings as directed by provider. 02/13/24   Aletha Bene, MD  ezetimibe  (ZETIA ) 10 MG tablet Take 1  tablet (10 mg total) by mouth daily. 11/12/23   Fernand Denyse LABOR, MD  Fish Oil-Vitamin D  1000-1000 MG-UNIT CAPS Take 1 capsule by mouth daily.    [provider]  furosemide  (LASIX ) 40 MG tablet Take 1 tablet (40 mg total) by mouth daily. 06/19/23 06/18/24  Fernand Denyse LABOR, MD  gabapentin  (NEURONTIN ) 300 MG capsule Take 1 capsule (300 mg total) by mouth 3 (three) times daily. 10/17/23   Therisa Benton PARAS, NP  insulin  aspart (NOVOLOG ) 100 UNIT/ML FlexPen Inject 10-16 Units into the skin 3 (three) times daily with meals. 11/07/23   Therisa Benton PARAS, NP  insulin  degludec (TRESIBA  FLEXTOUCH) 100 UNIT/ML FlexTouch Pen Inject 25 Units into the skin at bedtime. 11/07/23   Therisa Benton PARAS, NP  isosorbide  mononitrate (IMDUR ) 60 MG 24 hr tablet Take 1 tablet (60 mg total) by mouth daily. 09/04/23   Bevely Doffing, FNP  levothyroxine  (SYNTHROID ) 88 MCG tablet Take 1 tablet (88 mcg total) by mouth daily before breakfast. 11/07/23   Therisa Benton PARAS, NP  metoprolol  succinate (TOPROL -XL) 25 MG 24 hr tablet Take 1 tablet (25 mg total) by mouth daily. 10/12/23   Fernand Denyse LABOR, MD  spironolactone  (ALDACTONE ) 25 MG tablet Take 1 tablet (25 mg total) by mouth daily. 12/25/23   Fernand Denyse LABOR, MD    Allergies: Plavix [clopidogrel]    Review of Systems  Constitutional:  Negative for fever.  HENT:  Negative for congestion and sore throat.  Eyes: Negative.   Respiratory:  Negative for chest tightness and shortness of breath.   Cardiovascular:  Negative for chest pain.  Gastrointestinal:  Negative for abdominal pain and nausea.  Genitourinary: Negative.  Negative for decreased urine volume, dysuria, frequency and urgency.  Musculoskeletal:  Positive for back pain. Negative for arthralgias, joint swelling and neck pain.  Skin: Negative.  Negative for rash and wound.  Neurological:  Negative for dizziness, weakness, light-headedness, numbness and headaches.  Psychiatric/Behavioral: Negative.       Updated Vital Signs BP (!) 169/88   Pulse 65   Temp 97.6 F (36.4 C) (Oral)   Resp 18   Ht 6' 6 (1.981 m)   Wt 107.5 kg   SpO2 97%   BMI 27.39 kg/m   Physical Exam Vitals and nursing note reviewed.  Constitutional:      Appearance: He is well-developed.  HENT:     Head: Normocephalic and atraumatic.  Eyes:     Conjunctiva/sclera: Conjunctivae normal.  Cardiovascular:     Rate and Rhythm: Normal rate and regular rhythm.     Heart sounds: Normal heart sounds.  Pulmonary:     Effort: Pulmonary effort is normal.     Breath sounds: Normal breath sounds. No wheezing.  Abdominal:     General: Bowel sounds are normal.     Palpations: Abdomen is soft.     Tenderness: There is no abdominal tenderness.  Musculoskeletal:        General: Normal range of motion.     Cervical back: Normal range of motion.     Right lower leg: No edema.     Left lower leg: No edema.  Skin:    General: Skin is warm and dry.  Neurological:     Mental Status: He is alert.     (all labs ordered are listed, but only abnormal results are displayed) Labs Reviewed  COMPREHENSIVE METABOLIC PANEL WITH GFR - Abnormal; Notable for the following components:      Result Value   Potassium 5.5 (*)    Glucose, Bld 171 (*)    BUN 48 (*)    Creatinine, Ser 2.52 (*)    Calcium  10.5 (*)    Alkaline Phosphatase 172 (*)    GFR, Estimated 24 (*)    All other components within normal limits  POTASSIUM - Abnormal; Notable for the following components:   Potassium 5.5 (*)    All other components within normal limits  URINALYSIS, ROUTINE W REFLEX MICROSCOPIC  CBC    EKG: None  Radiology: No results found.  {Document cardiac monitor, telemetry assessment procedure when appropriate:32947} Procedures   Medications Ordered in the ED  sodium chloride  0.9 % bolus 1,000 mL (0 mLs Intravenous Stopped 02/15/24 2025)  sodium zirconium cyclosilicate  (LOKELMA ) packet 5 g (5 g Oral Given 02/15/24 1759)  sodium  chloride 0.9 % bolus 1,000 mL (1,000 mLs Intravenous New Bag/Given 02/15/24 2251)  sodium zirconium cyclosilicate  (LOKELMA ) packet 10 g (10 g Oral Given 02/15/24 2250)      {Click here for ABCD2, HEART and other calculators REFRESH Note before signing:1}                              Medical Decision Making Patient presented for evaluation of acute on chronic renal insufficiency.  His kidney function today is little bit improved at 2.52 compared to labs earlier this week.  He does have a hyperkalemia with potassium of  5.5 which is concerning.  This was repeated from a new specimen and it is consistently 5.5.  CBC and urinalysis are negative.  He does not have an elevated BUN as well suggesting at least some degree of dehydration.  He will be given IV fluids.  He was also given a dose of Lokelma  to improve his potassium.  After discussion with nephrologist Dr. Dalene, he agrees with this plan and close outpatient follow-up, no indication for admission today.  Amount and/or Complexity of Data Reviewed Labs: ordered.    Details: Labs as outlined above. ECG/medicine tests: ordered.  Risk Prescription drug management.  Norine fever ***  {Document critical care time when appropriate  Document review of labs and clinical decision tools ie CHADS2VASC2, etc  Document your independent review of radiology images and any outside records  Document your discussion with family members, caretakers and with consultants  Document social determinants of health affecting pt's care  Document your decision making why or why not admission, treatments were needed:32947:::1}   Final diagnoses:  None    ED Discharge Orders     None        "

## 2024-02-25 ENCOUNTER — Ambulatory Visit: Admitting: Podiatry

## 2024-03-06 ENCOUNTER — Ambulatory Visit: Admitting: Nurse Practitioner

## 2024-03-10 ENCOUNTER — Ambulatory Visit

## 2024-03-26 ENCOUNTER — Ambulatory Visit (INDEPENDENT_AMBULATORY_CARE_PROVIDER_SITE_OTHER): Admitting: Nurse Practitioner

## 2024-03-26 ENCOUNTER — Encounter (INDEPENDENT_AMBULATORY_CARE_PROVIDER_SITE_OTHER)

## 2024-03-27 ENCOUNTER — Other Ambulatory Visit

## 2024-03-28 ENCOUNTER — Ambulatory Visit: Admitting: Cardiovascular Disease

## 2024-04-21 ENCOUNTER — Ambulatory Visit

## 2024-05-12 ENCOUNTER — Ambulatory Visit: Admitting: Family Medicine

## 2024-11-12 ENCOUNTER — Other Ambulatory Visit

## 2024-11-19 ENCOUNTER — Ambulatory Visit: Admitting: Urology
